# Patient Record
Sex: Female | Born: 1935 | Race: White | Hispanic: No | Smoking: Never smoker
Health system: Southern US, Community
[De-identification: ages and names within clinical notes are randomized; demographics above are authoritative.]

## PROBLEM LIST (undated history)

## (undated) DIAGNOSIS — R42 Dizziness and giddiness: Secondary | ICD-10-CM

## (undated) DIAGNOSIS — M1990 Unspecified osteoarthritis, unspecified site: Secondary | ICD-10-CM

## (undated) DIAGNOSIS — E079 Disorder of thyroid, unspecified: Secondary | ICD-10-CM

## (undated) DIAGNOSIS — F419 Anxiety disorder, unspecified: Secondary | ICD-10-CM

## (undated) DIAGNOSIS — I10 Essential (primary) hypertension: Secondary | ICD-10-CM

## (undated) DIAGNOSIS — K219 Gastro-esophageal reflux disease without esophagitis: Secondary | ICD-10-CM

## (undated) DIAGNOSIS — J302 Other seasonal allergic rhinitis: Secondary | ICD-10-CM

## (undated) DIAGNOSIS — E509 Vitamin A deficiency, unspecified: Secondary | ICD-10-CM

## (undated) DIAGNOSIS — F32A Depression, unspecified: Secondary | ICD-10-CM

## (undated) DIAGNOSIS — F329 Major depressive disorder, single episode, unspecified: Secondary | ICD-10-CM

## (undated) DIAGNOSIS — Z8781 Personal history of (healed) traumatic fracture: Secondary | ICD-10-CM

## (undated) DIAGNOSIS — E039 Hypothyroidism, unspecified: Secondary | ICD-10-CM

## (undated) DIAGNOSIS — S62101A Fracture of unspecified carpal bone, right wrist, initial encounter for closed fracture: Secondary | ICD-10-CM

## (undated) DIAGNOSIS — S42009A Fracture of unspecified part of unspecified clavicle, initial encounter for closed fracture: Secondary | ICD-10-CM

## (undated) DIAGNOSIS — M797 Fibromyalgia: Secondary | ICD-10-CM

## (undated) DIAGNOSIS — R296 Repeated falls: Secondary | ICD-10-CM

## (undated) HISTORY — PX: ABDOMINAL HYSTERECTOMY: SHX81

## (undated) HISTORY — PX: BACK SURGERY: SHX140

## (undated) HISTORY — PX: WRIST SURGERY: SHX841

## (undated) HISTORY — PX: CHOLECYSTECTOMY: SHX55

## (undated) HISTORY — PX: THYROIDECTOMY: SHX17

## (undated) HISTORY — PX: FRACTURE SURGERY: SHX138

---

## 1997-11-17 ENCOUNTER — Ambulatory Visit (HOSPITAL_COMMUNITY): Admission: RE | Admit: 1997-11-17 | Discharge: 1997-11-17 | Payer: Self-pay | Admitting: Orthopaedic Surgery

## 1997-11-17 ENCOUNTER — Encounter: Payer: Self-pay | Admitting: Orthopaedic Surgery

## 1997-12-04 ENCOUNTER — Ambulatory Visit (HOSPITAL_BASED_OUTPATIENT_CLINIC_OR_DEPARTMENT_OTHER): Admission: RE | Admit: 1997-12-04 | Discharge: 1997-12-04 | Payer: Self-pay | Admitting: Orthopedic Surgery

## 1998-04-29 ENCOUNTER — Other Ambulatory Visit: Admission: RE | Admit: 1998-04-29 | Discharge: 1998-04-29 | Payer: Self-pay | Admitting: Gynecology

## 1998-06-29 ENCOUNTER — Other Ambulatory Visit: Admission: RE | Admit: 1998-06-29 | Discharge: 1998-06-29 | Payer: Self-pay | Admitting: Gynecology

## 1998-08-18 ENCOUNTER — Ambulatory Visit (HOSPITAL_BASED_OUTPATIENT_CLINIC_OR_DEPARTMENT_OTHER): Admission: RE | Admit: 1998-08-18 | Discharge: 1998-08-18 | Payer: Self-pay | Admitting: Orthopedic Surgery

## 1998-11-16 ENCOUNTER — Encounter: Payer: Self-pay | Admitting: Orthopedic Surgery

## 1998-11-16 ENCOUNTER — Ambulatory Visit (HOSPITAL_COMMUNITY): Admission: RE | Admit: 1998-11-16 | Discharge: 1998-11-16 | Payer: Self-pay | Admitting: Orthopedic Surgery

## 1998-12-31 ENCOUNTER — Other Ambulatory Visit: Admission: RE | Admit: 1998-12-31 | Discharge: 1998-12-31 | Payer: Self-pay | Admitting: Gynecology

## 1999-03-14 ENCOUNTER — Encounter: Payer: Self-pay | Admitting: Orthopedic Surgery

## 1999-03-14 ENCOUNTER — Ambulatory Visit (HOSPITAL_COMMUNITY): Admission: RE | Admit: 1999-03-14 | Discharge: 1999-03-14 | Payer: Self-pay | Admitting: Orthopedic Surgery

## 1999-04-08 ENCOUNTER — Encounter: Admission: RE | Admit: 1999-04-08 | Discharge: 1999-05-13 | Payer: Self-pay | Admitting: Anesthesiology

## 1999-06-16 ENCOUNTER — Ambulatory Visit (HOSPITAL_COMMUNITY): Admission: RE | Admit: 1999-06-16 | Discharge: 1999-06-16 | Payer: Self-pay | Admitting: Neurosurgery

## 1999-06-16 ENCOUNTER — Encounter: Payer: Self-pay | Admitting: Neurosurgery

## 1999-06-24 ENCOUNTER — Encounter: Payer: Self-pay | Admitting: Neurosurgery

## 1999-06-27 ENCOUNTER — Other Ambulatory Visit: Admission: RE | Admit: 1999-06-27 | Discharge: 1999-06-27 | Payer: Self-pay | Admitting: Gynecology

## 1999-06-28 ENCOUNTER — Inpatient Hospital Stay (HOSPITAL_COMMUNITY): Admission: RE | Admit: 1999-06-28 | Discharge: 1999-06-30 | Payer: Self-pay | Admitting: Neurosurgery

## 1999-06-28 ENCOUNTER — Encounter: Payer: Self-pay | Admitting: Neurosurgery

## 1999-07-26 ENCOUNTER — Other Ambulatory Visit: Admission: RE | Admit: 1999-07-26 | Discharge: 1999-07-26 | Payer: Self-pay | Admitting: Gynecology

## 1999-07-26 ENCOUNTER — Encounter (INDEPENDENT_AMBULATORY_CARE_PROVIDER_SITE_OTHER): Payer: Self-pay | Admitting: Specialist

## 1999-09-26 ENCOUNTER — Other Ambulatory Visit: Admission: RE | Admit: 1999-09-26 | Discharge: 1999-09-26 | Payer: Self-pay | Admitting: Gynecology

## 1999-10-11 ENCOUNTER — Ambulatory Visit (HOSPITAL_COMMUNITY): Admission: RE | Admit: 1999-10-11 | Discharge: 1999-10-11 | Payer: Self-pay | Admitting: Gynecology

## 1999-10-11 ENCOUNTER — Encounter: Payer: Self-pay | Admitting: Gynecology

## 1999-10-27 ENCOUNTER — Encounter (INDEPENDENT_AMBULATORY_CARE_PROVIDER_SITE_OTHER): Payer: Self-pay

## 1999-10-27 ENCOUNTER — Other Ambulatory Visit: Admission: RE | Admit: 1999-10-27 | Discharge: 1999-10-27 | Payer: Self-pay | Admitting: Gynecology

## 1999-11-29 ENCOUNTER — Encounter: Admission: RE | Admit: 1999-11-29 | Discharge: 1999-12-27 | Payer: Self-pay | Admitting: Neurosurgery

## 2000-02-22 ENCOUNTER — Encounter (INDEPENDENT_AMBULATORY_CARE_PROVIDER_SITE_OTHER): Payer: Self-pay | Admitting: Specialist

## 2000-02-22 ENCOUNTER — Ambulatory Visit (HOSPITAL_COMMUNITY): Admission: RE | Admit: 2000-02-22 | Discharge: 2000-02-22 | Payer: Self-pay | Admitting: Family Medicine

## 2000-02-28 ENCOUNTER — Encounter: Payer: Self-pay | Admitting: *Deleted

## 2000-02-28 ENCOUNTER — Ambulatory Visit (HOSPITAL_COMMUNITY): Admission: RE | Admit: 2000-02-28 | Discharge: 2000-02-28 | Payer: Self-pay | Admitting: *Deleted

## 2000-05-29 ENCOUNTER — Other Ambulatory Visit: Admission: RE | Admit: 2000-05-29 | Discharge: 2000-05-29 | Payer: Self-pay | Admitting: Gynecology

## 2000-09-27 ENCOUNTER — Other Ambulatory Visit: Admission: RE | Admit: 2000-09-27 | Discharge: 2000-09-27 | Payer: Self-pay | Admitting: Gynecology

## 2001-02-21 ENCOUNTER — Encounter: Payer: Self-pay | Admitting: Internal Medicine

## 2001-02-21 ENCOUNTER — Ambulatory Visit (HOSPITAL_COMMUNITY): Admission: RE | Admit: 2001-02-21 | Discharge: 2001-02-21 | Payer: Self-pay | Admitting: Internal Medicine

## 2001-05-13 ENCOUNTER — Other Ambulatory Visit: Admission: RE | Admit: 2001-05-13 | Discharge: 2001-05-13 | Payer: Self-pay | Admitting: Gynecology

## 2001-11-12 ENCOUNTER — Ambulatory Visit (HOSPITAL_COMMUNITY): Admission: RE | Admit: 2001-11-12 | Discharge: 2001-11-12 | Payer: Self-pay | Admitting: *Deleted

## 2001-11-12 ENCOUNTER — Encounter (INDEPENDENT_AMBULATORY_CARE_PROVIDER_SITE_OTHER): Payer: Self-pay | Admitting: Specialist

## 2001-12-30 ENCOUNTER — Ambulatory Visit (HOSPITAL_COMMUNITY): Admission: RE | Admit: 2001-12-30 | Discharge: 2001-12-30 | Payer: Self-pay | Admitting: *Deleted

## 2002-01-28 ENCOUNTER — Other Ambulatory Visit: Admission: RE | Admit: 2002-01-28 | Discharge: 2002-01-28 | Payer: Self-pay | Admitting: Gynecology

## 2002-04-23 ENCOUNTER — Encounter: Admission: RE | Admit: 2002-04-23 | Discharge: 2002-04-23 | Payer: Self-pay | Admitting: *Deleted

## 2002-04-23 ENCOUNTER — Encounter: Payer: Self-pay | Admitting: *Deleted

## 2002-08-27 ENCOUNTER — Encounter: Admission: RE | Admit: 2002-08-27 | Discharge: 2002-08-27 | Payer: Self-pay | Admitting: Internal Medicine

## 2002-08-27 ENCOUNTER — Encounter: Payer: Self-pay | Admitting: Internal Medicine

## 2002-09-01 ENCOUNTER — Other Ambulatory Visit: Admission: RE | Admit: 2002-09-01 | Discharge: 2002-09-01 | Payer: Self-pay | Admitting: Gynecology

## 2002-09-26 ENCOUNTER — Encounter: Payer: Self-pay | Admitting: Gynecology

## 2002-09-26 ENCOUNTER — Ambulatory Visit (HOSPITAL_COMMUNITY): Admission: RE | Admit: 2002-09-26 | Discharge: 2002-09-26 | Payer: Self-pay | Admitting: Gynecology

## 2002-11-10 ENCOUNTER — Ambulatory Visit (HOSPITAL_COMMUNITY): Admission: RE | Admit: 2002-11-10 | Discharge: 2002-11-10 | Payer: Self-pay | Admitting: Rheumatology

## 2002-11-10 ENCOUNTER — Encounter: Payer: Self-pay | Admitting: Rheumatology

## 2002-11-19 ENCOUNTER — Encounter: Admission: RE | Admit: 2002-11-19 | Discharge: 2002-11-19 | Payer: Self-pay | Admitting: Internal Medicine

## 2002-11-19 ENCOUNTER — Encounter: Payer: Self-pay | Admitting: Internal Medicine

## 2002-12-02 ENCOUNTER — Encounter: Payer: Self-pay | Admitting: Internal Medicine

## 2002-12-02 ENCOUNTER — Encounter (INDEPENDENT_AMBULATORY_CARE_PROVIDER_SITE_OTHER): Payer: Self-pay | Admitting: *Deleted

## 2002-12-02 ENCOUNTER — Ambulatory Visit (HOSPITAL_COMMUNITY): Admission: RE | Admit: 2002-12-02 | Discharge: 2002-12-02 | Payer: Self-pay | Admitting: Internal Medicine

## 2003-02-09 ENCOUNTER — Ambulatory Visit (HOSPITAL_COMMUNITY): Admission: RE | Admit: 2003-02-09 | Discharge: 2003-02-09 | Payer: Self-pay | Admitting: Endocrinology

## 2003-03-12 ENCOUNTER — Other Ambulatory Visit: Admission: RE | Admit: 2003-03-12 | Discharge: 2003-03-12 | Payer: Self-pay | Admitting: Gynecology

## 2003-07-22 ENCOUNTER — Ambulatory Visit (HOSPITAL_COMMUNITY): Admission: RE | Admit: 2003-07-22 | Discharge: 2003-07-22 | Payer: Self-pay | Admitting: Endocrinology

## 2003-11-03 ENCOUNTER — Other Ambulatory Visit: Admission: RE | Admit: 2003-11-03 | Discharge: 2003-11-03 | Payer: Self-pay | Admitting: *Deleted

## 2003-11-20 ENCOUNTER — Ambulatory Visit (HOSPITAL_COMMUNITY): Admission: RE | Admit: 2003-11-20 | Discharge: 2003-11-20 | Payer: Self-pay | Admitting: Gynecology

## 2003-11-27 ENCOUNTER — Encounter: Admission: RE | Admit: 2003-11-27 | Discharge: 2003-11-27 | Payer: Self-pay | Admitting: Rheumatology

## 2004-02-25 ENCOUNTER — Ambulatory Visit (HOSPITAL_COMMUNITY): Admission: RE | Admit: 2004-02-25 | Discharge: 2004-02-25 | Payer: Self-pay | Admitting: General Surgery

## 2004-06-21 ENCOUNTER — Other Ambulatory Visit: Admission: RE | Admit: 2004-06-21 | Discharge: 2004-06-21 | Payer: Self-pay | Admitting: Gynecology

## 2004-07-26 ENCOUNTER — Ambulatory Visit (HOSPITAL_COMMUNITY): Admission: RE | Admit: 2004-07-26 | Discharge: 2004-07-26 | Payer: Self-pay | Admitting: Gynecology

## 2004-10-10 ENCOUNTER — Ambulatory Visit (HOSPITAL_COMMUNITY): Admission: RE | Admit: 2004-10-10 | Discharge: 2004-10-10 | Payer: Self-pay | Admitting: General Surgery

## 2004-11-23 ENCOUNTER — Ambulatory Visit (HOSPITAL_COMMUNITY): Admission: RE | Admit: 2004-11-23 | Discharge: 2004-11-23 | Payer: Self-pay | Admitting: Family Medicine

## 2004-12-21 ENCOUNTER — Other Ambulatory Visit: Admission: RE | Admit: 2004-12-21 | Discharge: 2004-12-21 | Payer: Self-pay | Admitting: Gynecology

## 2005-09-21 ENCOUNTER — Other Ambulatory Visit: Admission: RE | Admit: 2005-09-21 | Discharge: 2005-09-21 | Payer: Self-pay | Admitting: Gynecology

## 2005-09-26 ENCOUNTER — Encounter: Admission: RE | Admit: 2005-09-26 | Discharge: 2005-09-26 | Payer: Self-pay | Admitting: Cardiology

## 2005-10-10 ENCOUNTER — Encounter (HOSPITAL_COMMUNITY): Admission: RE | Admit: 2005-10-10 | Discharge: 2005-11-08 | Payer: Self-pay | Admitting: Cardiology

## 2006-04-12 ENCOUNTER — Ambulatory Visit (HOSPITAL_COMMUNITY): Admission: RE | Admit: 2006-04-12 | Discharge: 2006-04-12 | Payer: Self-pay | Admitting: Gynecology

## 2006-04-12 ENCOUNTER — Other Ambulatory Visit: Admission: RE | Admit: 2006-04-12 | Discharge: 2006-04-12 | Payer: Self-pay | Admitting: Gynecology

## 2007-04-24 ENCOUNTER — Other Ambulatory Visit: Admission: RE | Admit: 2007-04-24 | Discharge: 2007-04-24 | Payer: Self-pay | Admitting: Gynecology

## 2008-01-29 ENCOUNTER — Other Ambulatory Visit: Admission: RE | Admit: 2008-01-29 | Discharge: 2008-01-29 | Payer: Self-pay | Admitting: Gynecology

## 2009-01-01 ENCOUNTER — Ambulatory Visit (HOSPITAL_COMMUNITY): Admission: RE | Admit: 2009-01-01 | Discharge: 2009-01-01 | Payer: Self-pay | Admitting: Gastroenterology

## 2009-06-17 ENCOUNTER — Encounter: Admission: RE | Admit: 2009-06-17 | Discharge: 2009-06-22 | Payer: Self-pay | Admitting: Family Medicine

## 2009-07-06 ENCOUNTER — Encounter: Admission: RE | Admit: 2009-07-06 | Discharge: 2009-08-16 | Payer: Self-pay | Admitting: Family Medicine

## 2009-07-27 ENCOUNTER — Ambulatory Visit (HOSPITAL_COMMUNITY): Admission: RE | Admit: 2009-07-27 | Discharge: 2009-07-27 | Payer: Self-pay | Admitting: Gynecology

## 2009-09-27 ENCOUNTER — Encounter: Admission: RE | Admit: 2009-09-27 | Discharge: 2009-09-27 | Payer: Self-pay | Admitting: Family Medicine

## 2009-11-15 ENCOUNTER — Encounter: Admission: RE | Admit: 2009-11-15 | Discharge: 2009-11-15 | Payer: Self-pay | Admitting: Family Medicine

## 2009-11-24 ENCOUNTER — Ambulatory Visit: Payer: Self-pay | Admitting: Vascular Surgery

## 2009-11-24 ENCOUNTER — Ambulatory Visit (HOSPITAL_COMMUNITY): Admission: RE | Admit: 2009-11-24 | Discharge: 2009-11-24 | Payer: Self-pay | Admitting: Family Medicine

## 2009-11-24 ENCOUNTER — Encounter: Payer: Self-pay | Admitting: Family Medicine

## 2009-12-03 ENCOUNTER — Encounter: Payer: Self-pay | Admitting: Family Medicine

## 2009-12-03 ENCOUNTER — Ambulatory Visit: Admission: RE | Admit: 2009-12-03 | Discharge: 2009-12-03 | Payer: Self-pay | Admitting: Family Medicine

## 2010-01-18 ENCOUNTER — Encounter: Admission: RE | Admit: 2010-01-18 | Discharge: 2010-01-18 | Payer: Self-pay | Admitting: Family Medicine

## 2010-03-10 ENCOUNTER — Ambulatory Visit: Admit: 2010-03-10 | Payer: Self-pay | Admitting: Internal Medicine

## 2010-03-24 ENCOUNTER — Ambulatory Visit
Admission: RE | Admit: 2010-03-24 | Discharge: 2010-03-24 | Disposition: A | Discharge status: Home / Self Care | Payer: Medicare Other | Source: Ambulatory Visit | Attending: Family Medicine | Admitting: Family Medicine

## 2010-03-24 ENCOUNTER — Other Ambulatory Visit: Payer: Self-pay | Admitting: Family Medicine

## 2010-03-24 DIAGNOSIS — R52 Pain, unspecified: Secondary | ICD-10-CM

## 2010-04-13 ENCOUNTER — Other Ambulatory Visit (HOSPITAL_COMMUNITY): Payer: Self-pay | Admitting: Orthopaedic Surgery

## 2010-04-13 DIAGNOSIS — IMO0002 Reserved for concepts with insufficient information to code with codable children: Secondary | ICD-10-CM

## 2010-04-18 ENCOUNTER — Ambulatory Visit (HOSPITAL_COMMUNITY): Admission: RE | Admit: 2010-04-18 | Payer: Medicare Other | Source: Ambulatory Visit

## 2010-04-19 ENCOUNTER — Ambulatory Visit (HOSPITAL_COMMUNITY)
Admission: RE | Admit: 2010-04-19 | Discharge: 2010-04-19 | Disposition: A | Discharge status: Home / Self Care | Payer: Medicare Other | Source: Ambulatory Visit | Attending: Orthopaedic Surgery | Admitting: Orthopaedic Surgery

## 2010-04-19 ENCOUNTER — Other Ambulatory Visit (HOSPITAL_COMMUNITY): Payer: Self-pay | Admitting: Interventional Radiology

## 2010-04-19 DIAGNOSIS — IMO0002 Reserved for concepts with insufficient information to code with codable children: Secondary | ICD-10-CM

## 2010-04-22 ENCOUNTER — Ambulatory Visit (HOSPITAL_COMMUNITY)
Admission: RE | Admit: 2010-04-22 | Discharge: 2010-04-22 | Disposition: A | Discharge status: Home / Self Care | Payer: Medicare Other | Source: Ambulatory Visit | Attending: Interventional Radiology | Admitting: Interventional Radiology

## 2010-04-22 DIAGNOSIS — D45 Polycythemia vera: Secondary | ICD-10-CM | POA: Insufficient documentation

## 2010-04-22 DIAGNOSIS — Z01812 Encounter for preprocedural laboratory examination: Secondary | ICD-10-CM | POA: Insufficient documentation

## 2010-04-22 DIAGNOSIS — K219 Gastro-esophageal reflux disease without esophagitis: Secondary | ICD-10-CM | POA: Insufficient documentation

## 2010-04-22 DIAGNOSIS — M8448XA Pathological fracture, other site, initial encounter for fracture: Secondary | ICD-10-CM | POA: Insufficient documentation

## 2010-04-22 DIAGNOSIS — IMO0002 Reserved for concepts with insufficient information to code with codable children: Secondary | ICD-10-CM | POA: Insufficient documentation

## 2010-04-22 DIAGNOSIS — M48061 Spinal stenosis, lumbar region without neurogenic claudication: Secondary | ICD-10-CM | POA: Insufficient documentation

## 2010-04-22 DIAGNOSIS — E119 Type 2 diabetes mellitus without complications: Secondary | ICD-10-CM | POA: Insufficient documentation

## 2010-04-22 DIAGNOSIS — IMO0001 Reserved for inherently not codable concepts without codable children: Secondary | ICD-10-CM | POA: Insufficient documentation

## 2010-04-22 LAB — APTT: aPTT: 27 seconds (ref 24–37)

## 2010-04-22 LAB — CBC
HCT: 48.4 % — ABNORMAL HIGH (ref 36.0–46.0)
Hemoglobin: 17.3 g/dL — ABNORMAL HIGH (ref 12.0–15.0)
MCH: 30.1 pg (ref 26.0–34.0)
MCHC: 35.7 g/dL (ref 30.0–36.0)
MCV: 84.2 fL (ref 78.0–100.0)
Platelets: 234 10*3/uL (ref 150–400)
RBC: 5.75 MIL/uL — ABNORMAL HIGH (ref 3.87–5.11)
RDW: 13.8 % (ref 11.5–15.5)
WBC: 8.3 10*3/uL (ref 4.0–10.5)

## 2010-04-22 LAB — PROTIME-INR
INR: 0.96 (ref 0.00–1.49)
Prothrombin Time: 13 seconds (ref 11.6–15.2)

## 2010-04-22 LAB — POCT I-STAT, CHEM 8
Calcium, Ion: 1.05 mmol/L — ABNORMAL LOW (ref 1.12–1.32)
Creatinine, Ser: 0.9 mg/dL (ref 0.4–1.2)
Hemoglobin: 17.7 g/dL — ABNORMAL HIGH (ref 12.0–15.0)
Sodium: 138 mEq/L (ref 135–145)
TCO2: 31 mmol/L (ref 0–100)

## 2010-04-22 MED ORDER — IOHEXOL 300 MG/ML  SOLN
3.0000 mL | Freq: Once | INTRAMUSCULAR | Status: AC | PRN
  Administered 2010-04-22: 3 mL via INTRAVENOUS

## 2010-04-25 ENCOUNTER — Other Ambulatory Visit (HOSPITAL_COMMUNITY): Payer: Self-pay | Admitting: Interventional Radiology

## 2010-04-25 DIAGNOSIS — IMO0002 Reserved for concepts with insufficient information to code with codable children: Secondary | ICD-10-CM

## 2010-05-03 ENCOUNTER — Ambulatory Visit (HOSPITAL_COMMUNITY): Payer: Medicare Other

## 2010-05-03 ENCOUNTER — Ambulatory Visit (HOSPITAL_COMMUNITY)
Admission: RE | Admit: 2010-05-03 | Discharge: 2010-05-03 | Disposition: A | Discharge status: Home / Self Care | Payer: Medicare Other | Source: Ambulatory Visit | Attending: Interventional Radiology | Admitting: Interventional Radiology

## 2010-05-03 DIAGNOSIS — IMO0002 Reserved for concepts with insufficient information to code with codable children: Secondary | ICD-10-CM

## 2010-05-06 ENCOUNTER — Ambulatory Visit (HOSPITAL_COMMUNITY): Payer: Medicare Other

## 2010-07-06 ENCOUNTER — Other Ambulatory Visit: Payer: Self-pay | Admitting: Internal Medicine

## 2010-07-06 DIAGNOSIS — M79605 Pain in left leg: Secondary | ICD-10-CM

## 2010-07-06 DIAGNOSIS — R609 Edema, unspecified: Secondary | ICD-10-CM

## 2010-07-08 ENCOUNTER — Other Ambulatory Visit: Payer: Medicare Other

## 2010-07-08 NOTE — Op Note (Signed)
NAMEMALEEHA, HALLS                          ACCOUNT NO.:  [id number]   MEDICAL RECORD NO.:  [id number]                   PATIENT TYPE:  AMB   LOCATION:  ENDO                                 FACILITY:  [facility]   PHYSICIAN:  Althea Grimmer. Luther Parody, M.D.            DATE OF BIRTH:  [birth year]   DATE OF PROCEDURE:  DATE OF DISCHARGE:  12/30/2001                                 OPERATIVE REPORT   PROCEDURE:  Esophageal manometry.   INDICATIONS FOR PROCEDURE:  Chest discomfort, reflux, nausea, regurgitation  and dysphagia.  The lower esophageal sphincter occurred from 39.5 cm to 43  cm and the lower esophageal sphincter pressure was 29.3 mmHg which is  normal.  There was 72% relaxation with swallows which is normal with a  residual pressure of 8 mmHg.  The peristaltic waves of the proximal, mid and  distal esophagus were within normal limits with  mean distal esophageal  amplitude of the heart and 57 mmHg which is normal.  The upper esophageal  sphincter pressure was minimally hypertensive at 131.5 mmHg.  Review of the  manometry tracings indicates appropriate relaxation of the lower esophageal  sphincter with normal peristaltic wave approach.   IMPRESSION:  Essentially normal esophageal manometry.   PROCEDURE:  A 24-hour pH probe testing.   INDICATIONS FOR PROCEDURE:  As above.   DESCRIPTION OF PROCEDURE:  The procedure was performed on single dose proton  pump inhibitor.  The proximal probe revealed 28 total reflux episodes, 15  upright and 13 recumbent, none lasted more than five minutes.  Longest  duration was one minute.  The distal probe revealed 50 reflux episodes, 33  of which were recumbent, the longest of which lasted two minutes.  The  composite analysis revealed 1.1% of the time the patient was having reflux  with no episodes over five minutes and the longest episode of one and a half  minutes.  Her total episodes were 57 with normal being less than 50.  Her  composite  Demister score was 7.8 with normal being less than 22.  Although  two of the episodes clearly were associated with the sensation of heartburn,  there was very poor correlation overall of reflux with either sensations of  heartburn or chest pain.   IMPRESSION:  The patient has multiple mild brief episodes of reflux which  should not be giving her the degree of symptoms which she reports.                                               Althea Grimmer. Luther Parody, M.D.    PJS/MEDQ  D:  01/07/2002  T:  01/07/2002  Job:  [id number]   cc:   Darius Bump, M.D.  [place]  [place] [address]  Fax: [phone]

## 2010-07-08 NOTE — Op Note (Signed)
[place]. [facility]  Patient:    Erika Conley, Erika Conley                       MRN: [id number] Proc. Date: 06/28/99 Adm. Date:  [id number] Attending:  Josie Saunders                           Operative Report  PREOPERATIVE DIAGNOSES: 1. Lumbar spinal stenosis (lateral recess stenosis) L4 and L5 bilaterally. 2. Lumbar radiculopathy 3. Degenerative disk disease. 4. Herniated lumbar disk  POSTOPERATIVE DIAGNOSIS: 1. Lumbar spinal stenosis (lateral recessed stenosis) L4 and L5 bilaterally. 2. Lumbar radiculopathy 3. Degenerative disk disease. 4. Herniated lumbar disk  OPERATION:  Decompressive lumbar laminectomy L4 and L5.  SURGEON:  Danae Orleans. Venetia Maxon, M.D.  ASSISTANT:  Alanson Aly. Roxan Hockey, M.D.  ANESTHESIA:  General endotracheal anesthesia.  ESTIMATED BLOOD LOSS:  Less than 100 cc.  COMPLICATIONS:  None.  DISPOSITION:  Recovery.  INDICATIONS:  [name] 76 year old woman with severe left lower extremity pain and L5 radiculopathy.  She was found to have bilateral lateral recess stenosis L4-5.  It was elected to perform decompressive lumbar laminectomy L4 and L5.  DESCRIPTION OF PROCEDURE:  Ms. Vos was brought to the operating room. Following satisfactory and uncomplicated induction of general endotracheal anesthesia and placement of intravenous, she was placed in prone position on the Wilson frame.  Her low back was prepped and draped in the usual sterile fashion.  The area of planned incision was infiltrated with 0.25% Marcaine, 0.50% lidocaine, 1:200,000 epinephrine.  Incision was made overlying the L4 and L5 spinous processes, carried to the lumbar dorsal fascia.  It was then incised with electrocautery.  Subperiosteal dissection was performed exposing the L4 and L5 laminae bilaterally.  A self-retaining retractor was place. Intraoperative x-ray confirmed appropriate orientation with the L4 and L5 spinous processes.  Total laminectomy was  performed of L5 and the spinous process of L4 was removed.  Midas Rex drill with AM3 bur was used to thin the laminae of L4 and L5 bilaterally.  This was then completed with Kerrison rongeur.  The area was significantly hypertrophied ligamentum flavum.  This was decompressed.  Lateral recesses were decompressed, and the L5 nerve roots were decompressed bilaterally along with the S1 nerve roots.  The nerve roots were felt to be well decompressed, and hypertrophied ligamentum and osteophyte were removed.   The L4 and L5 and S1 nerve roots were all felt to be well decompressed.  Hemostasis was obtained with Gelfoam soak and thrombin.  The wound was copiously irrigated with bacitracin and saline.  The self-retaining retractor was removed.  The deep muscle layer was reapproximated with 0 Vicryl sutures, and the lumbar dorsal fascia was reapproximated with 0 Vicryl sutures.  Subcutaneous fat was reapproximated with 2-0 Vicryl interrupted inverted sutures. Skin edges were reapproximated with interrupted 3-0 Vicryl subcuticular stitch.  The wound was dressed with Dermabond, and a Telfa dressing was applied.  Counts were correct at the end of the case. DD:  06/28/99 TD:  06/29/99 Job: [id number]

## 2010-07-08 NOTE — Discharge Summary (Signed)
[place]. [facility]  Patient:    Erika Conley, Erika Conley                       MRN: [id number] Adm. Date:  [id number] Disc. Date: [id number] Attending:  Josie Saunders                           Discharge Summary  REASON FOR ADMISSION:  Lumbar spinal stenosis with lumbar degenerative disk disease and lumbar disk herniation with lumbar radiculopathy.  FINAL DIAGNOSIS:  Lumbar spinal stenosis with lumbar degenerative disk disease and lumbar disk herniation with lumbar radiculopathy.  HISTORY OF PRESENT ILLNESS AND HOSPITAL COURSE:  Erika Conley is a 76 year old woman with severe bilateral lumbar radiculopathy with lateral recess stenosis at L4-5.  The patient was admitted to the hospital on same-day-as-procedure basis, underwent uncomplicated decompressive lumbar laminectomy L4 and L5 levels.  Postoperatively, she had improvement in her radicular complaints. She was mobilized and gradually doing well as of Jun 30, 1999, and was discharged home at that point in stable and satisfactory condition, having tolerated the operation and hospitalization well.  DISCHARGE MEDICATIONS: 1. Percocet 1-2 every four hours as needed for pain. 2. Regular medications that she was taken preoperatively.  DISCHARGE INSTRUCTIONS:  No lifting, bending, twisting, or driving.  Okay to shower but not to soak her incision.  FOLLOW-UP:  In two weeks with Dr. Venetia Maxon. DD:  07/08/99 TD:  07/12/99 Job: [id number]

## 2010-07-08 NOTE — H&P (Signed)
[place]. [facility]  Patient:    Erika Conley, Erika Conley                       MRN: [id number] Adm. Date:  [id number] Attending:  Josie Saunders                         History and Physical  CHIEF COMPLAINT:  Lumbar stenosis, spondylosis, and lumbar radiculopathy.  HISTORY OF PRESENT ILLNESS:  Ms. Erika Conley is a patient whom I initially saw in 1998.  She returned to see me on June 13, 1999.  I had not seen her since 1998. At that point I was treating her for shoulder and neck pain, which subsequently was resolved, and she underwent rotator cuff repair surgery.  Ms. Erika Conley now presents  with a new complaint of left hip left pain and left hip pain.  She says she fell off of a cabinet 2-1/2 years ago when she was hanging curtains.  She developed ow back pain approximately 1-1/2 years ago.  She was initially felt by Dr. Darius Bump, to have bursitis, and she was treated with nonsteroidal anti-inflammatory drugs, but this did not improve her pain.  She then saw Dr. Loreta Ave, who obtained an MRI of her hip and did a cortisone shot.  She said she had a headache for five days and a rash after the cortisone shot, but did not get any improvement. Initially  Dr. Eulah Pont had been talking about possible hip replacement surgery.  She subsequently saw Dr. Artist Pais. Weingold, who felt that her problem may have been from her low back and not her hip, and he referred her to Dr. Harvie Junior, who diagnosed her with degenerative disk disease and bone spurs.  She is currently complaining of pain radiating into her left groin and into her foot, as she says it goes down to the side of her foot and also into the front of her foot.  She says it is more intense in her groin and also in the side of her leg, and particularly ith standing.  She notes that she has more pain into her buttock with sitting.  She  notes weakness in her leg, and says she drags  her leg.  She denies any right lower extremity or any bowel or bladder dysfunction.  Ms. Erika Conley has engaged in physical therapy, water therapy through the [facility] and she says these have been helpful, and that the pain eases, but never fully goes  away.  REVIEW OF SYSTEMS:  A detailed review of systems sheet was reviewed with the patient.  Pertinent positives include under eyes that she wears glasses.  Ears,  nose, mouth, and throat:  She has nasal congestion and drainage, sinus problems, sinus headaches, and sore throat.  Cardiovascular:  She has swelling in her feet and hands.  Leg pain with walking.  Musculoskeletal:  She has a previously-broken wrist, back pain, leg pain, joint pain, swelling, arthritis.  Psychiatric:  She has anxiety and depression:  Endocrine:  She has thyroid disease.  PAST MEDICAL HISTORY/PRIOR OPERATIONS/HOSPITALIZATIONS:  1. Significant for shoulder surgery.  2. Wrist fracture.  3. She was treated for acid reflux.  4. Treated for arthritis.  5. Prior appendectomy.  6. Cholecystectomy.  7. Thyroidectomy.  8. Partial hysterectomy.  9. Tubal ligation. 10. Four surgeries on her right wrist.  ALLERGIES:  CORTISONE, ACTONEL, CELEBREX, FOSAMAX, LODINE XL,  MICROBIC ________, RELAFEN, VIOXX, CODEINE.  She says she can take synthetic codeine.  CURRENT MEDICATIONS: 1. Aciphex 20 mg one q.d. 2. Aldactone 50 mg b.i.d. 3. Celexa 20 mg q.d. 4. Claritin D p.r.n. 5. Desyrel 150 mg q. night. 6. Lasix 40 mg q.d. 7. Premarin 0.625 mg q.d. 8. Synthroid 0.125 mg q.d. 9. Vicodin one to two q.4-6h. p.r.n. pain.  Currently her doctors include Dr. Darius Bump, who is her primary doctor,  Dr. Dimas Aguas C. Mezer, for GYN, Dr. Artist Pais. Weingold, an orthopedic doctor, and Dr. Harvie Junior is also an orthopedist for her, and Dr. Corrie Dandy is her psychiatrist.   HEIGHT & WEIGHT:  Height 5 feet 3 inches tall, weight 160 Erika Conley.  SOCIAL HISTORY:   She is a nonsmoker.  A social drinker of alcoholic beverages. No history of substance abuse.  DIAGNOSTIC STUDIES:  Ms. Erika Conley presents with an MRI of the lumbosacral spine which was obtained through [facility] on March 14, 1999, which showed spinal stenosis at L4-5 with marked degenerative disk disease at the L5-S1 level, with disk space narrowing with lateral recess stenosis at L4-5, without frank disk herniation.  PHYSICAL EXAMINATION:  GENERAL:  Ms. Erika Conley is awake, alert, pleasant, cooperative, uncomfortable-appearing older white female.  NEUROLOGIC/MUSCULOSKELETAL:  She has purplish discoloration in both of her feet, right worse than left, but with brisk capillary refill on weakly palpable pedal  pulses.  When she stands, her right hip is elevated as compared to the left. She has some pain to palpation of the lumbosacral junction and also left sciatic notch, but also some significant tenderness over her left greater trochanter.  When she walks, she walks with an antalgic gait, favoring her left lower extremity.  She is able to stand on her toes, but has decreased heel walking, secondary to pain. he is able to bend forward to touch her toes.  She has full lower extremity strength in all motor groups bilaterally, symmetric, with the exception of 4/5 left extensor hallucis longus strength, and also 4/5 left gluteus strength.  She has slight decrease to pin sensation in an S1 distribution on the left.  Reflexes are trace at the knees, absent at the ankles.  The great toes are downgoing to plantar stimulation.  Straight leg raising is positive on the left, for the left lower extremity pain to 40 degrees to Spurlings maneuver, but also she has positive Patricks test on the left.  IMPRESSION/RECOMMENDATIONS:  Ms. Erika Conley is a 76 year old woman who is currently working as a Scientist, physiological, who presented at the request of Dr. Harvie Junior for neurosurgical consultation  with left lower extremity pain.  She has evidence of an L5 radiculopathy on the left and she has gluteus weakness and also some extensor hallucis longus weakness and a positive straight leg raising. She is  also complaining of pain to palpation over her trochanter, and has been having ain radiating into her groin, and a positive Patricks test, which seem more typical of her pathology.  I have recommended to Ms. Slingerland that she undergo a lumbar myelography.  She subsequently did so.  She returned to see me on Jun 21, 1999.  She says her low ack and left leg were bothering her a lot more than previously.  While the radiologist did not comment about the significant stenosis on the lumbar myelogram and post-lumbar myelographic CT, I reviewed these films, and I do believe that they  show significant lateral recess stenosis, particularly  on the left, compressing the L5 nerve root, secondary to central disk herniation at L4-5, with multifactorial spinal stenosis.  She also has significant degenerative disk disease at the L5-S1 level.  I have recommended, based on the severity of her pain, as well as her imaging findings, that she undergo a decompressive lumbar laminectomy at L4 and L5.  I ent over the risks and benefits of surgery, as well as the typical hospital preoperative and postoperative course.  I reviewed the studies with the patient and went over her physical examination.  I reviewed surgical models and discussed the risks and benefits of surgery.  The risks of surgery were discussed in detail, nd include but are not limited to, the risks of anesthesia, blood loss, the possibility of hemorrhage, infection, damage to nerves, damage to blood vessels, injuries to the lumbar nerve roots, causing the temporary or permanent leg pain, numbness, or weakness.  There is potential for spinal fluid leak from a dural tear. There is potential for post-laminectomy spondylolisthesis,  recurrent disk herniation.  I quoted her a 10% failure to relieve pain, worsening of the pain, and a need for further surgery.  Ms. Spragg wished the surgery to be done as soon as possible.  This is set up for Jun 28, 1999. DD:  06/28/99 TD:  06/28/99 Job: [id number]

## 2010-07-11 ENCOUNTER — Ambulatory Visit
Admission: RE | Admit: 2010-07-11 | Discharge: 2010-07-11 | Disposition: A | Discharge status: Home / Self Care | Payer: Medicare Other | Source: Ambulatory Visit | Attending: Internal Medicine | Admitting: Internal Medicine

## 2010-07-11 ENCOUNTER — Other Ambulatory Visit: Payer: Self-pay | Admitting: Internal Medicine

## 2010-07-11 ENCOUNTER — Other Ambulatory Visit: Payer: Medicare Other

## 2010-07-11 DIAGNOSIS — I872 Venous insufficiency (chronic) (peripheral): Secondary | ICD-10-CM

## 2010-07-11 DIAGNOSIS — R600 Localized edema: Secondary | ICD-10-CM

## 2010-07-11 MED ORDER — IOHEXOL 300 MG/ML  SOLN
125.0000 mL | Freq: Once | INTRAMUSCULAR | Status: AC | PRN
  Administered 2010-07-11: 125 mL via INTRAVENOUS

## 2010-07-20 ENCOUNTER — Ambulatory Visit
Admission: RE | Admit: 2010-07-20 | Discharge: 2010-07-20 | Disposition: A | Discharge status: Home / Self Care | Payer: Medicare Other | Source: Ambulatory Visit | Attending: Internal Medicine | Admitting: Internal Medicine

## 2010-07-20 DIAGNOSIS — M79605 Pain in left leg: Secondary | ICD-10-CM

## 2010-07-20 DIAGNOSIS — I872 Venous insufficiency (chronic) (peripheral): Secondary | ICD-10-CM

## 2010-07-20 DIAGNOSIS — R609 Edema, unspecified: Secondary | ICD-10-CM

## 2010-07-20 DIAGNOSIS — R600 Localized edema: Secondary | ICD-10-CM

## 2010-11-02 ENCOUNTER — Institutional Professional Consult (permissible substitution): Payer: Medicare Other | Admitting: Internal Medicine

## 2010-11-03 ENCOUNTER — Other Ambulatory Visit: Payer: Self-pay | Admitting: Internal Medicine

## 2010-11-03 DIAGNOSIS — M545 Low back pain, unspecified: Secondary | ICD-10-CM

## 2010-11-11 ENCOUNTER — Ambulatory Visit
Admission: RE | Admit: 2010-11-11 | Discharge: 2010-11-11 | Disposition: A | Discharge status: Home / Self Care | Payer: Medicare Other | Source: Ambulatory Visit | Attending: Internal Medicine | Admitting: Internal Medicine

## 2010-11-11 ENCOUNTER — Institutional Professional Consult (permissible substitution): Payer: Medicare Other | Admitting: Internal Medicine

## 2010-11-11 DIAGNOSIS — M545 Low back pain: Secondary | ICD-10-CM

## 2010-11-21 ENCOUNTER — Other Ambulatory Visit (HOSPITAL_COMMUNITY): Payer: Self-pay | Admitting: Internal Medicine

## 2010-11-21 DIAGNOSIS — IMO0002 Reserved for concepts with insufficient information to code with codable children: Secondary | ICD-10-CM

## 2010-11-22 ENCOUNTER — Other Ambulatory Visit (HOSPITAL_COMMUNITY): Payer: Self-pay | Admitting: Interventional Radiology

## 2010-11-22 ENCOUNTER — Ambulatory Visit (HOSPITAL_COMMUNITY)
Admission: RE | Admit: 2010-11-22 | Discharge: 2010-11-22 | Disposition: A | Discharge status: Home / Self Care | Payer: Medicare Other | Source: Ambulatory Visit | Attending: Internal Medicine | Admitting: Internal Medicine

## 2010-11-22 DIAGNOSIS — IMO0002 Reserved for concepts with insufficient information to code with codable children: Secondary | ICD-10-CM

## 2010-11-29 ENCOUNTER — Other Ambulatory Visit: Payer: Self-pay | Admitting: Interventional Radiology

## 2010-11-29 ENCOUNTER — Ambulatory Visit (HOSPITAL_COMMUNITY)
Admission: RE | Admit: 2010-11-29 | Discharge: 2010-11-29 | Disposition: A | Discharge status: Home / Self Care | Payer: Medicare Other | Source: Ambulatory Visit | Attending: Interventional Radiology | Admitting: Interventional Radiology

## 2010-11-29 ENCOUNTER — Other Ambulatory Visit (HOSPITAL_COMMUNITY): Payer: Self-pay | Admitting: Interventional Radiology

## 2010-11-29 DIAGNOSIS — Z01812 Encounter for preprocedural laboratory examination: Secondary | ICD-10-CM | POA: Insufficient documentation

## 2010-11-29 DIAGNOSIS — M8448XA Pathological fracture, other site, initial encounter for fracture: Secondary | ICD-10-CM | POA: Insufficient documentation

## 2010-11-29 DIAGNOSIS — K219 Gastro-esophageal reflux disease without esophagitis: Secondary | ICD-10-CM | POA: Insufficient documentation

## 2010-11-29 DIAGNOSIS — E039 Hypothyroidism, unspecified: Secondary | ICD-10-CM | POA: Insufficient documentation

## 2010-11-29 DIAGNOSIS — IMO0002 Reserved for concepts with insufficient information to code with codable children: Secondary | ICD-10-CM

## 2010-11-29 DIAGNOSIS — IMO0001 Reserved for inherently not codable concepts without codable children: Secondary | ICD-10-CM | POA: Insufficient documentation

## 2010-11-29 DIAGNOSIS — D45 Polycythemia vera: Secondary | ICD-10-CM | POA: Insufficient documentation

## 2010-11-29 DIAGNOSIS — M810 Age-related osteoporosis without current pathological fracture: Secondary | ICD-10-CM | POA: Insufficient documentation

## 2010-11-29 DIAGNOSIS — E119 Type 2 diabetes mellitus without complications: Secondary | ICD-10-CM | POA: Insufficient documentation

## 2010-11-29 LAB — BASIC METABOLIC PANEL
BUN: 8 mg/dL (ref 6–23)
CO2: 32 mEq/L (ref 19–32)
Calcium: 9.3 mg/dL (ref 8.4–10.5)
Chloride: 97 mEq/L (ref 96–112)
Creatinine, Ser: 0.72 mg/dL (ref 0.50–1.10)
Glucose, Bld: 181 mg/dL — ABNORMAL HIGH (ref 70–99)

## 2010-11-29 LAB — CBC
HCT: 43.8 % (ref 36.0–46.0)
Hemoglobin: 15.6 g/dL — ABNORMAL HIGH (ref 12.0–15.0)
MCH: 30.5 pg (ref 26.0–34.0)
MCHC: 35.6 g/dL (ref 30.0–36.0)
MCV: 85.7 fL (ref 78.0–100.0)
RDW: 13.9 % (ref 11.5–15.5)

## 2010-11-29 LAB — PROTIME-INR: INR: 0.95 (ref 0.00–1.49)

## 2010-11-29 LAB — GLUCOSE, CAPILLARY: Glucose-Capillary: 179 mg/dL — ABNORMAL HIGH (ref 70–99)

## 2010-12-19 ENCOUNTER — Ambulatory Visit (HOSPITAL_COMMUNITY): Payer: Medicare Other

## 2010-12-21 ENCOUNTER — Other Ambulatory Visit: Payer: Self-pay | Admitting: Interventional Radiology

## 2010-12-21 DIAGNOSIS — I83812 Varicose veins of left lower extremities with pain: Secondary | ICD-10-CM

## 2011-01-18 ENCOUNTER — Telehealth (HOSPITAL_COMMUNITY): Payer: Self-pay

## 2011-01-18 ENCOUNTER — Encounter: Payer: Self-pay | Admitting: Radiology

## 2011-04-16 ENCOUNTER — Other Ambulatory Visit: Payer: Self-pay | Admitting: Internal Medicine

## 2011-04-16 MED ORDER — VENLAFAXINE HCL ER 75 MG PO CP24: 75.0000 mg | ORAL_CAPSULE | Freq: Every day | ORAL | Status: DC

## 2011-05-09 NOTE — Telephone Encounter (Signed)
                           Contacts         Type  Contact  Phone    01/18/2011 12:58 PM  Phone (Incoming)  Giana, Castner (Self)  [phone] (H)    Returned her call

## 2012-12-02 ENCOUNTER — Other Ambulatory Visit: Payer: Self-pay | Admitting: Internal Medicine

## 2012-12-02 ENCOUNTER — Ambulatory Visit
Admission: RE | Admit: 2012-12-02 | Discharge: 2012-12-02 | Disposition: A | Discharge status: Home / Self Care | Payer: Medicare Other | Source: Ambulatory Visit | Attending: Internal Medicine | Admitting: Internal Medicine

## 2012-12-02 DIAGNOSIS — R270 Ataxia, unspecified: Secondary | ICD-10-CM

## 2013-02-16 ENCOUNTER — Emergency Department (HOSPITAL_COMMUNITY)
Admission: EM | Admit: 2013-02-16 | Discharge: 2013-02-16 | Disposition: A | Discharge status: Home / Self Care | Payer: Medicare Other | Attending: Emergency Medicine | Admitting: Emergency Medicine

## 2013-02-16 ENCOUNTER — Emergency Department (HOSPITAL_COMMUNITY): Payer: Medicare Other

## 2013-02-16 DIAGNOSIS — S52122A Displaced fracture of head of left radius, initial encounter for closed fracture: Secondary | ICD-10-CM

## 2013-02-16 DIAGNOSIS — Y929 Unspecified place or not applicable: Secondary | ICD-10-CM | POA: Insufficient documentation

## 2013-02-16 DIAGNOSIS — W1809XA Striking against other object with subsequent fall, initial encounter: Secondary | ICD-10-CM | POA: Insufficient documentation

## 2013-02-16 DIAGNOSIS — Y939 Activity, unspecified: Secondary | ICD-10-CM | POA: Insufficient documentation

## 2013-02-16 DIAGNOSIS — Z7982 Long term (current) use of aspirin: Secondary | ICD-10-CM | POA: Insufficient documentation

## 2013-02-16 DIAGNOSIS — S42212A Unspecified displaced fracture of surgical neck of left humerus, initial encounter for closed fracture: Secondary | ICD-10-CM

## 2013-02-16 DIAGNOSIS — S52123A Displaced fracture of head of unspecified radius, initial encounter for closed fracture: Secondary | ICD-10-CM | POA: Insufficient documentation

## 2013-02-16 DIAGNOSIS — R42 Dizziness and giddiness: Secondary | ICD-10-CM | POA: Insufficient documentation

## 2013-02-16 DIAGNOSIS — S42213A Unspecified displaced fracture of surgical neck of unspecified humerus, initial encounter for closed fracture: Secondary | ICD-10-CM | POA: Insufficient documentation

## 2013-02-16 DIAGNOSIS — S0990XA Unspecified injury of head, initial encounter: Secondary | ICD-10-CM | POA: Insufficient documentation

## 2013-02-16 DIAGNOSIS — Z79899 Other long term (current) drug therapy: Secondary | ICD-10-CM | POA: Insufficient documentation

## 2013-02-16 DIAGNOSIS — W19XXXA Unspecified fall, initial encounter: Secondary | ICD-10-CM

## 2013-02-16 LAB — CBC WITH DIFFERENTIAL/PLATELET
Basophils Absolute: 0 10*3/uL (ref 0.0–0.1)
Basophils Relative: 0 % (ref 0–1)
Eosinophils Absolute: 0 10*3/uL (ref 0.0–0.7)
Eosinophils Relative: 0 % (ref 0–5)
HCT: 39.9 % (ref 36.0–46.0)
Hemoglobin: 14.6 g/dL (ref 12.0–15.0)
Lymphocytes Relative: 15 % (ref 12–46)
Lymphs Abs: 2.2 10*3/uL (ref 0.7–4.0)
MCH: 30.9 pg (ref 26.0–34.0)
MCHC: 36.6 g/dL — ABNORMAL HIGH (ref 30.0–36.0)
MCV: 84.4 fL (ref 78.0–100.0)
Monocytes Absolute: 1.1 10*3/uL — ABNORMAL HIGH (ref 0.1–1.0)
Monocytes Relative: 8 % (ref 3–12)
Neutro Abs: 11.5 10*3/uL — ABNORMAL HIGH (ref 1.7–7.7)
Neutrophils Relative %: 78 % — ABNORMAL HIGH (ref 43–77)
Platelets: 228 10*3/uL (ref 150–400)
RBC: 4.73 MIL/uL (ref 3.87–5.11)
RDW: 13.1 % (ref 11.5–15.5)
WBC: 14.9 10*3/uL — ABNORMAL HIGH (ref 4.0–10.5)

## 2013-02-16 LAB — COMPREHENSIVE METABOLIC PANEL
ALT: 34 U/L (ref 0–35)
AST: 40 U/L — ABNORMAL HIGH (ref 0–37)
Albumin: 4.2 g/dL (ref 3.5–5.2)
Alkaline Phosphatase: 138 U/L — ABNORMAL HIGH (ref 39–117)
BUN: 12 mg/dL (ref 6–23)
CO2: 30 mEq/L (ref 19–32)
Calcium: 10 mg/dL (ref 8.4–10.5)
Chloride: 94 mEq/L — ABNORMAL LOW (ref 96–112)
Creatinine, Ser: 0.88 mg/dL (ref 0.50–1.10)
GFR calc Af Amer: 72 mL/min — ABNORMAL LOW (ref >90)
GFR calc non Af Amer: 62 mL/min — ABNORMAL LOW (ref >90)
Glucose, Bld: 110 mg/dL — ABNORMAL HIGH (ref 70–99)
Potassium: 3.4 mEq/L — ABNORMAL LOW (ref 3.5–5.1)
Sodium: 134 mEq/L — ABNORMAL LOW (ref 135–145)
Total Bilirubin: 1 mg/dL (ref 0.3–1.2)
Total Protein: 7.3 g/dL (ref 6.0–8.3)

## 2013-02-16 LAB — URINALYSIS, ROUTINE W REFLEX MICROSCOPIC
Bilirubin Urine: NEGATIVE
Glucose, UA: NEGATIVE mg/dL
Hgb urine dipstick: NEGATIVE
Ketones, ur: NEGATIVE mg/dL
Leukocytes, UA: NEGATIVE
Nitrite: NEGATIVE
Protein, ur: NEGATIVE mg/dL
Specific Gravity, Urine: 1.013 (ref 1.005–1.030)
Urobilinogen, UA: 1 mg/dL (ref 0.0–1.0)
pH: 7.5 (ref 5.0–8.0)

## 2013-02-16 LAB — CK: Total CK: 240 U/L — ABNORMAL HIGH (ref 7–177)

## 2013-02-16 MED ORDER — KETOROLAC TROMETHAMINE 30 MG/ML IJ SOLN
15.0000 mg | Freq: Once | INTRAMUSCULAR | Status: AC
  Administered 2013-02-16: 15 mg via INTRAVENOUS
  Filled 2013-02-16: qty 1

## 2013-02-16 MED ORDER — KETOROLAC TROMETHAMINE 60 MG/2ML IM SOLN
60.0000 mg | Freq: Once | INTRAMUSCULAR | Status: AC
  Administered 2013-02-16: 60 mg via INTRAMUSCULAR
  Filled 2013-02-16: qty 2

## 2013-02-16 MED ORDER — TRAMADOL HCL 50 MG PO TABS: 50.0000 mg | ORAL_TABLET | Freq: Four times a day (QID) | ORAL | Status: DC | PRN

## 2013-02-16 MED ORDER — MORPHINE SULFATE 4 MG/ML IJ SOLN: 4.0000 mg | Freq: Once | INTRAMUSCULAR | Status: DC

## 2013-02-16 NOTE — ED Provider Notes (Signed)
CSN: [id number]     Arrival date & time 02/16/13  1217 History   First MD Initiated Contact with Patient 02/16/13 1239     Chief Complaint  Patient presents with  . Fall  . Head Injury  . Arm Pain   (Consider location/radiation/quality/duration/timing/severity/associated sxs/prior Treatment) HPI Patient presents emergency department after 2 falls occurred last night, and this morning.  The patient, states she has a history of vertigo and felt dizzy like with her previous vertiginous symptoms.  Patient, states, that she did not have syncope, nausea, vomiting, diarrhea, headache, blurred vision, weakness, numbness, chest pain, shortness of breath, or abdominal pain.  Patient also denies fevers.  Patient, states, that she did not take any medications prior to arrival.  States movement and palpation of her arm pain, worse. No past medical history on file. No past surgical history on file. No family history on file. History  Substance Use Topics  . Smoking status: Never Smoker   . Smokeless tobacco: Never Used  . Alcohol Use: No   OB History   Grav Para Term Preterm Abortions TAB SAB Ect Mult Living                 Review of Systems All other systems negative except as documented in the HPI. All pertinent positives and negatives as reviewed in the HPI. Allergies  Codeine; Cortisone; and Other  Home Medications   Current Outpatient Rx  Name  Route  Sig  Dispense  Refill  . acetaminophen (TYLENOL) 500 MG tablet   Oral   Take 500 mg by mouth every 6 (six) hours as needed for mild pain or moderate pain.         Marland Kitchen ALPRAZolam (XANAX) 1 MG tablet   Oral   Take 1 mg by mouth at bedtime as needed for anxiety.         Marland Kitchen aspirin 81 MG tablet   Oral   Take 81 mg by mouth daily.         . Calcium Carb-Cholecalciferol 600-800 MG-UNIT TABS   Oral   Take 1 tablet by mouth daily.         . fexofenadine (ALLEGRA) 180 MG tablet   Oral   Take 180 mg by mouth daily.         Marland Kitchen  gabapentin (NEURONTIN) 300 MG capsule   Oral   Take 300 mg by mouth 3 (three) times daily.         Marland Kitchen levothyroxine (SYNTHROID, LEVOTHROID) 112 MCG tablet   Oral   Take 112 mcg by mouth daily before breakfast.         . meclizine (ANTIVERT) 25 MG tablet   Oral   Take 25 mg by mouth 3 (three) times daily as needed for dizziness.         . montelukast (SINGULAIR) 10 MG tablet   Oral   Take 10 mg by mouth at bedtime.         . Multiple Vitamins-Minerals (CENTRUM SILVER ADULT 50+ PO)   Oral   Take 1 tablet by mouth daily.         . Omega-3 Fatty Acids (FISH OIL PO)   Oral   Take 1 tablet by mouth daily.         . pantoprazole (PROTONIX) 40 MG tablet   Oral   Take 40 mg by mouth daily.         . promethazine (PHENERGAN) 25 MG tablet   Oral  Take 25 mg by mouth every 6 (six) hours as needed for nausea or vomiting.         . ranitidine (ZANTAC) 150 MG tablet   Oral   Take 150 mg by mouth daily.         . traZODone (DESYREL) 150 MG tablet   Oral   Take 50-150 mg by mouth at bedtime.         . triamterene-hydrochlorothiazide (MAXZIDE) 75-50 MG per tablet   Oral   Take 1 tablet by mouth daily.         Marland Kitchen venlafaxine (EFFEXOR XR) 75 MG 24 hr capsule   Oral   Take 1 capsule (75 mg total) by mouth daily.   30 capsule   2   . venlafaxine (EFFEXOR) 100 MG tablet   Oral   Take 100 mg by mouth 3 (three) times daily.         . Vitamin D, Ergocalciferol, (DRISDOL) 50000 UNITS CAPS capsule   Oral   Take 50,000 Units by mouth every 14 (fourteen) days.          BP 124/74  Pulse 77  Temp(Src) 98.4 F (36.9 C) (Oral)  Resp 18  SpO2 97% Physical Exam  Nursing note and vitals reviewed. Constitutional: She is oriented to person, place, and time. She appears well-developed and well-nourished. No distress.  HENT:  Head: Normocephalic and atraumatic.  Mouth/Throat: Oropharynx is clear and moist.  Eyes: Pupils are equal, round, and reactive to light.   Neck: Normal range of motion. Neck supple.  Cardiovascular: Normal rate, regular rhythm and normal heart sounds.  Exam reveals no gallop.   No murmur heard. Pulmonary/Chest: Effort normal and breath sounds normal. No respiratory distress.  Abdominal: Soft. Bowel sounds are normal. She exhibits no distension.  Neurological: She is alert and oriented to person, place, and time. She exhibits normal muscle tone. Coordination normal.  Skin: Skin is warm and dry. No erythema.    ED Course  Procedures (including critical care time) Labs Review Labs Reviewed  CBC WITH DIFFERENTIAL - Abnormal; Notable for the following:    WBC 14.9 (*)    MCHC 36.6 (*)    Neutrophils Relative % 78 (*)    Neutro Abs 11.5 (*)    Monocytes Absolute 1.1 (*)    All other components within normal limits  COMPREHENSIVE METABOLIC PANEL - Abnormal; Notable for the following:    Sodium 134 (*)    Potassium 3.4 (*)    Chloride 94 (*)    Glucose, Bld 110 (*)    AST 40 (*)    Alkaline Phosphatase 138 (*)    GFR calc non Af Amer 62 (*)    GFR calc Af Amer 72 (*)    All other components within normal limits  CK - Abnormal; Notable for the following:    Total CK 240 (*)    All other components within normal limits  URINALYSIS, ROUTINE W REFLEX MICROSCOPIC   Imaging Review Dg Elbow Complete Left  02/16/2013   CLINICAL DATA:  Fall.  Left shoulder pain.  EXAM: LEFT ELBOW - COMPLETE 3+ VIEW  COMPARISON:  None.  FINDINGS: The patient has a minimally impacted fracture of the radial head. The fracture appears oriented in the sagittal plane. The elbow is located and no other fracture is identified.  IMPRESSION: Minimally impacted fracture left radial head.   Electronically Signed   By: Drusilla Kanner M.D.   On: 02/16/2013 15:00   Dg  Wrist Complete Right  02/16/2013   CLINICAL DATA:  Fall.  Right wrist laceration pain.  EXAM: RIGHT WRIST - COMPLETE 3+ VIEW  COMPARISON:  None.  FINDINGS: There appears to be a laceration  over the dorsum of the wrist. No underlying acute bony or joint abnormality is identified. Old healed distal radius fracture with fixation hardware in place noted.  IMPRESSION: Laceration without underlying acute bony or joint abnormality.   Electronically Signed   By: Drusilla Kanner M.D.   On: 02/16/2013 15:02   Ct Head Wo Contrast  02/16/2013   CLINICAL DATA:  Fall with a blow to the head.  EXAM: CT HEAD WITHOUT CONTRAST  CT CERVICAL SPINE WITHOUT CONTRAST  TECHNIQUE: Multidetector CT imaging of the head and cervical spine was performed following the standard protocol without intravenous contrast. Multiplanar CT image reconstructions of the cervical spine were also generated.  COMPARISON:  Head CT scan 12/02/2012.  FINDINGS: CT HEAD FINDINGS  There is some cortical atrophy and chronic microvascular ischemic change. No evidence of acute abnormality including infarction, hemorrhage, mass lesion, mass effect, midline shift or abnormal extra-axial fluid collection is identified. The calvarium is intact. There is no hydrocephalus or pneumocephalus.  CT CERVICAL SPINE FINDINGS  No cervical spine fracture or malalignment is identified. There is loss of disc space height and endplate spurring at C5-6. Multilevel facet degenerative change is identified. Lung apices are clear.  IMPRESSION: No acute finding head or cervical spine.  No change in atrophy and chronic microvascular ischemic change.  Cervical degenerative disease appearing worst at C5-6.   Electronically Signed   By: Drusilla Kanner M.D.   On: 02/16/2013 14:41   Ct Cervical Spine Wo Contrast  02/16/2013   CLINICAL DATA:  Fall with a blow to the head.  EXAM: CT HEAD WITHOUT CONTRAST  CT CERVICAL SPINE WITHOUT CONTRAST  TECHNIQUE: Multidetector CT imaging of the head and cervical spine was performed following the standard protocol without intravenous contrast. Multiplanar CT image reconstructions of the cervical spine were also generated.  COMPARISON:   Head CT scan 12/02/2012.  FINDINGS: CT HEAD FINDINGS  There is some cortical atrophy and chronic microvascular ischemic change. No evidence of acute abnormality including infarction, hemorrhage, mass lesion, mass effect, midline shift or abnormal extra-axial fluid collection is identified. The calvarium is intact. There is no hydrocephalus or pneumocephalus.  CT CERVICAL SPINE FINDINGS  No cervical spine fracture or malalignment is identified. There is loss of disc space height and endplate spurring at C5-6. Multilevel facet degenerative change is identified. Lung apices are clear.  IMPRESSION: No acute finding head or cervical spine.  No change in atrophy and chronic microvascular ischemic change.  Cervical degenerative disease appearing worst at C5-6.   Electronically Signed   By: Drusilla Kanner M.D.   On: 02/16/2013 14:41   Dg Humerus Left  02/16/2013   CLINICAL DATA:  Fall.  Left shoulder pain.  EXAM: LEFT HUMERUS - 2+ VIEW  COMPARISON:  None.  FINDINGS: Surgical neck fracture of the left humerus is identified. No other acute bony or joint abnormality is seen.  IMPRESSION: Surgical neck fracture left humerus.   Electronically Signed   By: Drusilla Kanner M.D.   On: 02/16/2013 14:59   Dg Finger Middle Right  02/16/2013   CLINICAL DATA:  Fall.  Left shoulder pain.  EXAM: RIGHT MIDDLE FINGER 2+V  COMPARISON:  None.  FINDINGS: No acute bony or joint abnormality is identified. There is advanced osteoarthritis about the DIP and  PIP joints of the long and ring fingers. Soft tissue structures are unremarkable.  IMPRESSION: No acute finding.  Marked osteoarthritis.   Electronically Signed   By: Drusilla Kanner M.D.   On: 02/16/2013 15:02    EKG Interpretation    Date/Time:  Sunday February 16 2013 12:36:17 EST Ventricular Rate:  75 PR Interval:  174 QRS Duration: 88 QT Interval:  404 QTC Calculation: 451 R Axis:   -18 Text Interpretation:  Sinus rhythm Borderline left axis deviation Low voltage,  precordial leads No significant change was found Confirmed by CAMPOS  MD, Caryn Bee ([id number]) on 02/16/2013 1:03:08 PM           Patient is set up with case management for home health.  Patient be placed in a short-arm splint with splint.  Patient is agreeable to plan along with family.  Patient is stable here in the emergency department.   Carlyle Dolly, PA-C 02/16/13 [phone]

## 2013-02-16 NOTE — ED Notes (Signed)
Bed: WA09 Expected date: 02/16/13 Expected time: 12:04 PM Means of arrival: Ambulance Comments: Fall, wrist injury

## 2013-02-16 NOTE — Progress Notes (Signed)
Pt requested NCM speak to family to arrange [facility]. Family currently not in room to answer questions. Will wait for family to return. Isidoro Donning RN CCM Case Mgmt phone [phone]

## 2013-02-16 NOTE — ED Provider Notes (Signed)
Medical screening examination/treatment/procedure(s) were conducted as a shared visit with non-physician practitioner(s) and myself.  I personally evaluated the patient during the encounter.  EKG Interpretation    Date/Time:  Sunday February 16 2013 12:36:17 EST Ventricular Rate:  75 PR Interval:  174 QRS Duration: 88 QT Interval:  404 QTC Calculation: 451 R Axis:   -18 Text Interpretation:  Sinus rhythm Borderline left axis deviation Low voltage, precordial leads No significant change was found Confirmed by Kahil Agner  MD, Jewelz Kobus ([id number]) on 02/16/2013 1:03:08 PM            Patient to followup with Dr. Lars Mage gold.  Given the fact that she lives at home alone and ambulates around with a walker this will be much more difficult given her nonweightbearing status of her left upper extremity.  Pain treated in the emergency department.  Case management will be involved to provide additional assistance for home health needs.  Family is involved and understands the difficulty of the situation but is agreeable to help.  Lyanne Co, MD 02/16/13 [phone]

## 2013-02-16 NOTE — ED Notes (Signed)
Patient waiting to meet with case manager and ortho technician.

## 2013-02-16 NOTE — ED Notes (Signed)
Pt now complains of nausea, onset 1247 today.

## 2013-02-16 NOTE — ED Notes (Addendum)
Per EMS pt with Hx of vertigo, at 1930 yesterday pt became dizzy and fell onto floor, hit back of head, had neighbor come help her off floor. This am pt woke up with pain to left forearm. Pt states she cannot have narcotic medications. Patient reports falling again this morning, then crawling to the phone to call her neighbor for help again. Patient states that she falls chronically and is unsure whether past episodes are also due to her chronic dizziness.

## 2013-02-17 NOTE — Progress Notes (Signed)
   CARE MANAGEMENT NOTE 02/17/2013  Patient:  Erika Conley, Erika Conley   Account Number:  [id number]  Date Initiated:  02/17/2013  Documentation initiated by:  [facility]  Subjective/Objective Assessment:   falls     Action/Plan:   lives alone, son, and dtr, Erika Conley assist at home   Anticipated DC Date:  02/17/2013   Anticipated DC Plan:  HOME W HOME HEALTH SERVICES      DC Planning Services  CM consult      [facility] Choice  HOME HEALTH   Choice offered to / List presented to:  C-1 Patient        HH arranged  HH-1 RN  HH-2 PT  HH-3 OT      [facility] agency  [facility]   Status of service:  Completed, signed off Medicare Important Message given?   (If response is "NO", the following Medicare IM given date fields will be blank) Date Medicare IM given:   Date Additional Medicare IM given:    Discharge Disposition:  HOME W HOME HEALTH SERVICES  Per UR Regulation:    If discussed at Long Length of Stay Meetings, dates discussed:    Comments:  02/17/2013 1400 NCM received call back from dtr, Erika Conley. States pt has RW and cane at home. States she lives alone in her apt and wants to remain in apt. States her brother is currently staying with pt and they alternate caring for pt. She does not want to leave her apt. Dtr is agreeable to [facility] for [facility]. Explained [facility] requested pt do follow up with PCP before they will service her for [facility]. Contacted [facility] for [facility] for [facility] RN, PT and OT. Dtr states pt is getting confused with her medications. Made [facility] rep aware for [facility] RN for follow up on meds complaince and education. Provided info on private duty care agencies. Dtr requested info faxed to office. Faxed list of private duty care agencies to dtr at fax # [phone].  Isidoro Donning RN CCM Case Mgmt phone [phone]  02/17/2013 1115 Received call back from pt and states she had [facility] in the past. She was closed out from their services. NCM contacted [place] and they did not accept  referral. Contacted pt to make aware. Offered choice for [facility]. Pt agreeable to [facility] for HH. States NCM will notify her dtr. Pt provided contacted number for dtr. Erika Conley at work, [phone]. Attempted call to work number. Left message on dtr's cell voice mail. Isidoro Donning RN CCM Case Mgmt phone [phone]   02/17/2013 11:09 AM  NCM contacted Erika Conley, dtr [phone] to arrange [facility]. Left message on voicemail. Isidoro Donning RN CCM Case Mgmt phone [phone]   02/16/2013 4:17 PM   Pt requested NCM speak to family to arrange [facility]. Family currently not in room to answer questions. Will wait for family to return. Isidoro Donning RN CCM Case Mgmt phone [phone]

## 2013-02-17 NOTE — Progress Notes (Signed)
NCM contacted Loetta Rough, dtr [phone] to arrange [facility]. Left message on voicemail. Isidoro Donning RN CCM Case Mgmt phone [phone]

## 2013-03-02 ENCOUNTER — Other Ambulatory Visit (HOSPITAL_COMMUNITY): Payer: Medicare Other

## 2013-03-02 ENCOUNTER — Encounter (HOSPITAL_COMMUNITY): Payer: Self-pay | Admitting: Emergency Medicine

## 2013-03-02 ENCOUNTER — Emergency Department (HOSPITAL_COMMUNITY): Payer: Medicare Other

## 2013-03-02 ENCOUNTER — Emergency Department (HOSPITAL_COMMUNITY)
Admission: EM | Admit: 2013-03-02 | Discharge: 2013-03-02 | Disposition: A | Discharge status: Home / Self Care | Payer: Medicare Other | Attending: Emergency Medicine | Admitting: Emergency Medicine

## 2013-03-02 DIAGNOSIS — Z79899 Other long term (current) drug therapy: Secondary | ICD-10-CM | POA: Insufficient documentation

## 2013-03-02 DIAGNOSIS — W1809XA Striking against other object with subsequent fall, initial encounter: Secondary | ICD-10-CM | POA: Insufficient documentation

## 2013-03-02 DIAGNOSIS — K219 Gastro-esophageal reflux disease without esophagitis: Secondary | ICD-10-CM | POA: Insufficient documentation

## 2013-03-02 DIAGNOSIS — S22009A Unspecified fracture of unspecified thoracic vertebra, initial encounter for closed fracture: Secondary | ICD-10-CM | POA: Insufficient documentation

## 2013-03-02 DIAGNOSIS — M549 Dorsalgia, unspecified: Secondary | ICD-10-CM

## 2013-03-02 DIAGNOSIS — Y9389 Activity, other specified: Secondary | ICD-10-CM | POA: Insufficient documentation

## 2013-03-02 DIAGNOSIS — Z8781 Personal history of (healed) traumatic fracture: Secondary | ICD-10-CM | POA: Insufficient documentation

## 2013-03-02 DIAGNOSIS — S22000A Wedge compression fracture of unspecified thoracic vertebra, initial encounter for closed fracture: Secondary | ICD-10-CM

## 2013-03-02 DIAGNOSIS — E079 Disorder of thyroid, unspecified: Secondary | ICD-10-CM | POA: Insufficient documentation

## 2013-03-02 DIAGNOSIS — R42 Dizziness and giddiness: Secondary | ICD-10-CM | POA: Insufficient documentation

## 2013-03-02 DIAGNOSIS — Z9889 Other specified postprocedural states: Secondary | ICD-10-CM | POA: Insufficient documentation

## 2013-03-02 DIAGNOSIS — M129 Arthropathy, unspecified: Secondary | ICD-10-CM | POA: Insufficient documentation

## 2013-03-02 DIAGNOSIS — Z9181 History of falling: Secondary | ICD-10-CM | POA: Insufficient documentation

## 2013-03-02 DIAGNOSIS — Y92009 Unspecified place in unspecified non-institutional (private) residence as the place of occurrence of the external cause: Secondary | ICD-10-CM | POA: Insufficient documentation

## 2013-03-02 DIAGNOSIS — I10 Essential (primary) hypertension: Secondary | ICD-10-CM | POA: Insufficient documentation

## 2013-03-02 DIAGNOSIS — Z7982 Long term (current) use of aspirin: Secondary | ICD-10-CM | POA: Insufficient documentation

## 2013-03-02 HISTORY — DX: Unspecified osteoarthritis, unspecified site: M19.90

## 2013-03-02 HISTORY — DX: Fracture of unspecified carpal bone, right wrist, initial encounter for closed fracture: S62.101A

## 2013-03-02 HISTORY — DX: Disorder of thyroid, unspecified: E07.9

## 2013-03-02 HISTORY — DX: Essential (primary) hypertension: I10

## 2013-03-02 HISTORY — DX: Fracture of unspecified part of unspecified clavicle, initial encounter for closed fracture: S42.009A

## 2013-03-02 HISTORY — DX: Repeated falls: R29.6

## 2013-03-02 HISTORY — DX: Personal history of (healed) traumatic fracture: Z87.81

## 2013-03-02 LAB — COMPREHENSIVE METABOLIC PANEL
ALBUMIN: 3.7 g/dL (ref 3.5–5.2)
ALT: 19 U/L (ref 0–35)
AST: 20 U/L (ref 0–37)
Alkaline Phosphatase: 161 U/L — ABNORMAL HIGH (ref 39–117)
BUN: 9 mg/dL (ref 6–23)
CALCIUM: 9.4 mg/dL (ref 8.4–10.5)
CO2: 27 mEq/L (ref 19–32)
CREATININE: 0.85 mg/dL (ref 0.50–1.10)
Chloride: 98 mEq/L (ref 96–112)
GFR calc Af Amer: 75 mL/min — ABNORMAL LOW (ref >90)
GFR calc non Af Amer: 65 mL/min — ABNORMAL LOW (ref >90)
Glucose, Bld: 110 mg/dL — ABNORMAL HIGH (ref 70–99)
Potassium: 3.8 mEq/L (ref 3.7–5.3)
Sodium: 140 mEq/L (ref 137–147)
TOTAL PROTEIN: 7.1 g/dL (ref 6.0–8.3)
Total Bilirubin: 0.8 mg/dL (ref 0.3–1.2)

## 2013-03-02 LAB — CBC WITH DIFFERENTIAL/PLATELET
BASOS ABS: 0 10*3/uL (ref 0.0–0.1)
BASOS PCT: 0 % (ref 0–1)
EOS ABS: 0 10*3/uL (ref 0.0–0.7)
EOS PCT: 0 % (ref 0–5)
HEMATOCRIT: 39.4 % (ref 36.0–46.0)
HEMOGLOBIN: 13.8 g/dL (ref 12.0–15.0)
Lymphocytes Relative: 17 % (ref 12–46)
Lymphs Abs: 2.2 10*3/uL (ref 0.7–4.0)
MCH: 30.6 pg (ref 26.0–34.0)
MCHC: 35 g/dL (ref 30.0–36.0)
MCV: 87.4 fL (ref 78.0–100.0)
MONO ABS: 0.9 10*3/uL (ref 0.1–1.0)
MONOS PCT: 7 % (ref 3–12)
Neutro Abs: 9.6 10*3/uL — ABNORMAL HIGH (ref 1.7–7.7)
Neutrophils Relative %: 76 % (ref 43–77)
Platelets: 280 10*3/uL (ref 150–400)
RBC: 4.51 MIL/uL (ref 3.87–5.11)
RDW: 13.8 % (ref 11.5–15.5)
WBC: 12.7 10*3/uL — ABNORMAL HIGH (ref 4.0–10.5)

## 2013-03-02 LAB — URINALYSIS W MICROSCOPIC + REFLEX CULTURE
Bilirubin Urine: NEGATIVE
GLUCOSE, UA: NEGATIVE mg/dL
HGB URINE DIPSTICK: NEGATIVE
Ketones, ur: NEGATIVE mg/dL
Nitrite: NEGATIVE
PROTEIN: NEGATIVE mg/dL
SPECIFIC GRAVITY, URINE: 1.006 (ref 1.005–1.030)
Urobilinogen, UA: 0.2 mg/dL (ref 0.0–1.0)
pH: 7 (ref 5.0–8.0)

## 2013-03-02 MED ORDER — FENTANYL CITRATE 0.05 MG/ML IJ SOLN
50.0000 ug | Freq: Once | INTRAMUSCULAR | Status: AC
  Administered 2013-03-02: 50 ug via INTRAVENOUS
  Filled 2013-03-02: qty 2

## 2013-03-02 MED ORDER — TRAMADOL HCL 50 MG PO TABS: 50.0000 mg | ORAL_TABLET | Freq: Four times a day (QID) | ORAL | Status: DC | PRN

## 2013-03-02 MED ORDER — SODIUM CHLORIDE 0.9 % IV BOLUS (SEPSIS)
500.0000 mL | INTRAVENOUS | Status: AC
  Administered 2013-03-02: 500 mL via INTRAVENOUS

## 2013-03-02 MED ORDER — ACETAMINOPHEN 325 MG PO TABS
650.0000 mg | ORAL_TABLET | Freq: Once | ORAL | Status: AC
  Administered 2013-03-02: 650 mg via ORAL
  Filled 2013-03-02: qty 2

## 2013-03-02 NOTE — ED Notes (Signed)
Pt from home via [facility] with c/o back pain and dizziness.  Pt was seen here 2 weeks ago for fall and this past Thursday.  Pt has a broken clavicle and elbow.  Hx of vertigo.  Pt's account of hx shows some confusion and anxiety.  Reports she also feels like she is "withdrawling from xanax."  PCP took her off this xanax about 2 weeks ago and she started Buspar.  Pt in NAD, A&O.

## 2013-03-02 NOTE — ED Notes (Signed)
Patient transported to MRI 

## 2013-03-02 NOTE — ED Notes (Signed)
Pt returned from radiology.

## 2013-03-02 NOTE — Discharge Instructions (Signed)
Back Injury Prevention The following tips can help you to prevent a back injury. PHYSICAL FITNESS  Exercise often. Try to develop strong stomach (abdominal) muscles.  Do aerobic exercises often. This includes walking, jogging, biking, swimming.  Do exercises that help with balance and strength often. This includes tai chi and yoga.  Stretch before and after you exercise.  Keep a healthy weight. DIET   Ask your doctor how much calcium and vitamin D you need every day.  Include calcium in your diet. Foods high in calcium include dairy products; green, leafy vegetables; and products with calcium added (fortified).  Include vitamin D in your diet. Foods high in vitamin D include milk and products with vitamin D added.  Think about taking a multivitamin or other nutritional products called " supplements."  Stop smoking if you smoke. POSTURE   Sit and stand up straight. Avoid leaning forward or hunching over.  Choose chairs that support your lower back.  If you work at a desk:  Sit close to your work so you do not lean over.  Keep your chin tucked in.  Keep your neck drawn back.  Keep your elbows bent at a right angle. Your arms should look like the letter "L."  Sit high and close to the steering wheel when you drive. Add low back support to your car seat if needed.  Avoid sitting or standing in one position for too long. Get up and move around every hour. Take breaks if you are driving for a long time.  Sleep on your side with your knees slightly bent. You can also sleep on your back with a pillow under your knees. Do not sleep on your stomach. LIFTING, TWISTING, AND REACHING  Avoid heavy lifting, especially lifting over and over again. If you must do heavy lifting:  Stretch before lifting.  Work slowly.  Rest between lifts.  Use carts and dollies to move objects when possible.  Make several small trips instead of carrying 1 heavy load.  Ask for help when you  need it.  Ask for help when moving big, awkward objects.  Follow these steps when lifting:  Stand with your feet shoulder-width apart.  Get as close to the object as you can. Do not pick up heavy objects that are far from your body.  Use handles or lifting straps when possible.  Bend at your knees. Squat down, but keep your heels off the floor.  Keep your shoulders back, your chin tucked in, and your back straight.  Lift the object slowly. Tighten the muscles in your legs, stomach, and butt. Keep the object as close to the center of your body as possible.  Reverse these directions when you put a load down.  Do not:  Lift the object above your waist.  Twist at the waist while lifting or carrying a load. Move your feet if you need to turn, not your waist.  Bend over without bending at your knees.  Avoid reaching over your head, across a table, or for an object on a high surface. OTHER TIPS  Avoid wet floors and keep sidewalks clear of ice.  Do not sleep on a mattress that is too soft or too hard.  Keep items that you use often within easy reach.  Put heavier objects on shelves at waist level. Put lighter objects on lower or higher shelves.  Find ways to lessen your stress. You can try exercise, massage, or relaxation.  Get help for depression or anxiety if  Keep items that you use often within easy reach.  · Put heavier objects on shelves at waist level. Put lighter objects on lower or higher shelves.  · Find ways to lessen your stress. You can try exercise, massage, or relaxation.  · Get help for depression or anxiety if needed.  GET HELP IF:  · You injure your back.  · You have questions about diet, exercise, or other ways to prevent back injuries.  MAKE SURE YOU:  · Understand these instructions.  · Will watch your condition.  · Will get help right away if you are not doing well or get worse.  Document Released: 07/26/2007 Document Revised: 05/01/2011 Document Reviewed: 03/20/2011  [employer]® Patient Information ©2014 [employer].

## 2013-03-02 NOTE — ED Provider Notes (Signed)
CSN: [id number]     Arrival date & time 03/02/13  [id number] History   First MD Initiated Contact with Patient 03/02/13 [phone]     Chief Complaint  Patient presents with  . Back Pain   (Consider location/radiation/quality/duration/timing/severity/associated sxs/prior Treatment) Patient is a 79 y.o. female presenting with back pain. The history is provided by the patient.  Back Pain Location:  Thoracic spine and lumbar spine Quality:  Aching Radiates to:  Does not radiate Pain severity:  Moderate Pain is:  Same all the time Onset quality:  Gradual Duration:  4 days Timing:  Constant Progression:  Unchanged Chronicity:  Chronic Context comment:  Hit her back against the toilet. Relieved by:  Nothing Worsened by:  Nothing tried Ineffective treatments: tramadol. Associated symptoms: no abdominal pain, no chest pain, no dysuria, no fever and no headaches     Past Medical History  Diagnosis Date  . Falls frequently   . Arthritis   . Hypertension   . Fracture, clavicle   . Thyroid disease   . History of fractured vertebra   . Wrist fracture, right    Past Surgical History  Procedure Laterality Date  . Thyroidectomy    . Abdominal hysterectomy    . Back surgery    . Cholecystectomy    . Wrist surgery    . Fracture surgery     History reviewed. No pertinent family history. History  Substance Use Topics  . Smoking status: Never Smoker   . Smokeless tobacco: Never Used  . Alcohol Use: No   OB History   Grav Para Term Preterm Abortions TAB SAB Ect Mult Living                 Review of Systems  Constitutional: Negative for fever and fatigue.  HENT: Negative for congestion and drooling.   Eyes: Negative for pain.  Respiratory: Negative for cough and shortness of breath.   Cardiovascular: Negative for chest pain.  Gastrointestinal: Negative for nausea, vomiting, abdominal pain and diarrhea.  Genitourinary: Negative for dysuria and hematuria.  Musculoskeletal: Positive for  back pain. Negative for gait problem and neck pain.  Skin: Negative for color change.  Neurological: Positive for dizziness. Negative for headaches.  Hematological: Negative for adenopathy.  Psychiatric/Behavioral: Negative for behavioral problems.  All other systems reviewed and are negative.    Allergies  Codeine; Cortisone; and Other  Home Medications   Current Outpatient Rx  Name  Route  Sig  Dispense  Refill  . acetaminophen (TYLENOL) 500 MG tablet   Oral   Take 500 mg by mouth every 6 (six) hours as needed for mild pain or moderate pain.         Marland Kitchen aspirin 81 MG tablet   Oral   Take 81 mg by mouth daily.         . Calcium Carb-Cholecalciferol 600-800 MG-UNIT TABS   Oral   Take 1 tablet by mouth daily.         . fexofenadine (ALLEGRA) 180 MG tablet   Oral   Take 180 mg by mouth daily.         Marland Kitchen gabapentin (NEURONTIN) 300 MG capsule   Oral   Take 300 mg by mouth 3 (three) times daily.         Marland Kitchen levothyroxine (SYNTHROID, LEVOTHROID) 112 MCG tablet   Oral   Take 112 mcg by mouth daily before breakfast.         . meclizine (ANTIVERT) 25 MG tablet  Oral   Take 25 mg by mouth 3 (three) times daily as needed for dizziness.         . montelukast (SINGULAIR) 10 MG tablet   Oral   Take 10 mg by mouth at bedtime.         . Multiple Vitamins-Minerals (CENTRUM SILVER ADULT 50+ PO)   Oral   Take 1 tablet by mouth daily.         . Omega-3 Fatty Acids (FISH OIL PO)   Oral   Take 1 tablet by mouth daily.         . pantoprazole (PROTONIX) 40 MG tablet   Oral   Take 40 mg by mouth daily.         . promethazine (PHENERGAN) 25 MG tablet   Oral   Take 25 mg by mouth every 6 (six) hours as needed for nausea or vomiting.         . ranitidine (ZANTAC) 150 MG tablet   Oral   Take 150 mg by mouth daily.         . traMADol (ULTRAM) 50 MG tablet   Oral   Take 1 tablet (50 mg total) by mouth every 6 (six) hours as needed.   20 tablet   0   .  traZODone (DESYREL) 150 MG tablet   Oral   Take 50-150 mg by mouth at bedtime.         . triamterene-hydrochlorothiazide (MAXZIDE) 75-50 MG per tablet   Oral   Take 1 tablet by mouth daily.         Marland Kitchen venlafaxine (EFFEXOR) 100 MG tablet   Oral   Take 100 mg by mouth 3 (three) times daily.         . Vitamin D, Ergocalciferol, (DRISDOL) 50000 UNITS CAPS capsule   Oral   Take 50,000 Units by mouth every 14 (fourteen) days.          BP 109/59  Pulse 77  Temp(Src) 98.3 F (36.8 C) (Oral)  Resp 15  SpO2 98% Physical Exam  Nursing note and vitals reviewed. Constitutional: She is oriented to person, place, and time. She appears well-developed and well-nourished.  HENT:  Head: Normocephalic.  Mouth/Throat: Oropharynx is clear and moist. No oropharyngeal exudate.  Eyes: Conjunctivae and EOM are normal. Pupils are equal, round, and reactive to light.  Neck: Normal range of motion. Neck supple.  Cardiovascular: Normal rate, regular rhythm, normal heart sounds and intact distal pulses.  Exam reveals no gallop and no friction rub.   No murmur heard. Pulmonary/Chest: Effort normal and breath sounds normal. No respiratory distress. She has no wheezes.  Abdominal: Soft. Bowel sounds are normal. There is no tenderness. There is no rebound and no guarding.  Musculoskeletal: Normal range of motion. She exhibits no edema and no tenderness.  Mild lower thoracic vertebral tenderness to palpation on exam. No other vertebral tenderness to palpation noted.  Neurological: She is alert and oriented to person, place, and time. She has normal strength. No cranial nerve deficit or sensory deficit.  Patient has normal grip strength in the left upper extremity. I did not fully test strength in the left upper extremity due to recent fractures.  Skin: Skin is warm and dry.  Psychiatric: She has a normal mood and affect. Her behavior is normal.    ED Course  Procedures (including critical care  time) Labs Review Labs Reviewed  CBC WITH DIFFERENTIAL - Abnormal; Notable for the following:    WBC  12.7 (*)    Neutro Abs 9.6 (*)    All other components within normal limits  COMPREHENSIVE METABOLIC PANEL - Abnormal; Notable for the following:    Glucose, Bld 110 (*)    Alkaline Phosphatase 161 (*)    GFR calc non Af Amer 65 (*)    GFR calc Af Amer 75 (*)    All other components within normal limits  URINALYSIS W MICROSCOPIC + REFLEX CULTURE - Abnormal; Notable for the following:    APPearance CLOUDY (*)    Leukocytes, UA TRACE (*)    Squamous Epithelial / LPF MANY (*)    All other components within normal limits   Imaging Review Dg Thoracic Spine 2 View  03/02/2013   CLINICAL DATA:  Repeated falls  EXAM: THORACIC SPINE - 2 VIEW  COMPARISON:  12/14/2011  FINDINGS: Three views of thoracic spine submitted. Again noted prior vertebroplasty at T11 and T12 level. There is interval mild compression deformity superior endplate of QA348G vertebral body of indeterminate age. Clinical correlation is necessary. If recent fracture is suspected further correlation with MRI is recommended.  IMPRESSION: Again noted prior vertebroplasty at T11 and T12 level. There is interval mild compression deformity superior endplate of QA348G vertebral body of indeterminate age. Clinical correlation is necessary. If recent fracture is suspected further correlation with MRI is recommended.   Electronically Signed   By: Lahoma Crocker M.D.   On: 03/02/2013 11:46   Dg Lumbar Spine 2-3 Views  03/02/2013   CLINICAL DATA:  Fall with low back pain. History of vertebral augmentation in the past.  EXAM: LUMBAR SPINE - 2-3 VIEW  COMPARISON:  10/09/2012 and prior radiographs  FINDINGS: There is no evidence of acute fracture or subluxation.  Remote compression fractures and vertebral augmentation changes of T11 and T12 again noted.  Diffuse osteopenia is noted.  Mild multilevel degenerative disc disease and moderate facet arthropathy  throughout the lumbar spine noted.  No focal bony lesions are present.  IMPRESSION: No evidence of acute bony abnormality.  Mild multilevel degenerative disc disease and moderate facet arthropathy.  Diffuse osteopenia with remote T11 and T12 compression fractures and vertebral augmentation changes.   Electronically Signed   By: Hassan Rowan M.D.   On: 03/02/2013 11:48   Ct Head Wo Contrast  03/02/2013   CLINICAL DATA:  Falls, dizziness, head injury  EXAM: CT HEAD WITHOUT CONTRAST  TECHNIQUE: Contiguous axial images were obtained from the base of the skull through the vertex without contrast.  COMPARISON:  02/16/2013  FINDINGS: Mild age related brain atrophy. Minor periventricular chronic white matter microvascular ischemic change. No acute intracranial hemorrhage, mass lesion, definite infarction, midline shift, herniation, or hydrocephalus. No extra-axial fluid collection. Cisterns are patent. No cerebellar abnormality. Mastoids and sinuses clear. No depressed skull fracture.  IMPRESSION: Stable brain atrophy. Minor periventricular white matter microvascular change.  No acute finding or interval change by CT.   Electronically Signed   By: Daryll Brod M.D.   On: 03/02/2013 11:08   Mr Thoracic Spine Wo Contrast  03/02/2013   CLINICAL DATA:  Back pain.  EXAM: MRI THORACIC SPINE WITHOUT CONTRAST  TECHNIQUE: Multiplanar, multisequence MR imaging was performed. No intravenous contrast was administered.  COMPARISON:  Radiographs 03/02/2009.  FINDINGS: The sagittal MRI images demonstrate normal overall alignment of the thoracic vertebral bodies. Stable vertebral augmentation changes at T11 and T12 with a vertebral plana appearance at T12 and associated moderate kyphosis. There is a superior endplate fracture involving T10 without significant  compression. There is surrounding paraspinal fluid/hematoma. No retropulsion or canal compromise. The thoracic spinal cord is unremarkable.  There is chronic retropulsion of T12  with 30% canal compromise and mild displacement of the thoracic cord posteriorly. No significant thoracic disc protrusions. Spinal canal is fairly generous and there is no significant spinal stenosis except at T12. There are multilevel dilated thoracic nerve root sheaths.  Scattered benign-appearing hemangiomas are noted at multiple levels. No worrisome bone lesions.  IMPRESSION: Superior endplate fracture at I45 without significant compression and no retropulsion. Small amount of paraspinal hematoma is noted.  Remote fractures and vertebral augmentation changes at T11 and T12. Chronic retropulsion at T12.  Normal appearance of the thoracic spinal cord.   Electronically Signed   By: Kalman Jewels M.D.   On: 03/02/2013 15:40    EKG Interpretation    Date/Time:  Sunday March 02 2013 08:03:28 EST Ventricular Rate:  75 PR Interval:  162 QRS Duration: 80 QT Interval:  388 QTC Calculation: 433 R Axis:   -2 Text Interpretation:  Normal sinus rhythm Normal ECG No significant change since last tracing Confirmed by Valita Righter  MD, Marisah Laker ([id number]) on 03/02/2013 8:10:45 AM            MDM   1. Thoracic compression fracture   2. Back pain    8:00 AM 79 y.o. female with a history of vertigo, falls with recent visit GERD and left arm fracture who presents with back pain. The patient states that 3 days ago she is trying to get up from the toilet and was having difficulty. She notes that she fell backward several times hitting the lower part of her back against the upper part of the toilet while she was trying to get up. She notes worsening back pain since that time. She also notes intermittent dizziness for the last 2 weeks that is consistent with her vertigo. She is afebrile and vital signs are unremarkable here. She is complaining of back pain and left arm pain associated with her recent fractures. She notes only minimal relief with tramadol at home. It is noted in her chart that she gets lightheaded and  sometimes syncopized when she gets narcotics. She insists that she needs something stronger for pain. I agreed to give her a fentanyl here to help manage her pain. We'll also get screening labwork and imaging as she has a history of recent falls.   Question of mild sup endplate deformity of I33. Rads recommending MRI of T-spine. Will order.  MRI shows sup endplate fx of A25, no signif retropulsion, small amount of paraspinal hematoma noted, normal appearing thoracic spinal cord.   Son and daughter now here. They note inc concern about pt's narcotic seeking behavior by them and her pcp. They are reporting pt has taken inc amounts of xanax in the last week. They not that she made no mention to them about the fall she is here for. Son notes pt has home health care daily to help her. I don't think a back brace would benefit the pt at this time. Will recommend conservative tx w/ pain control and rest. Will provide Rx for tramadol which her pcp also prescribes and rec close f/u. Son and daughter reporting pt has taken all her tramadol in the last week. She has an appt w/ her pcp in 2 days.    I discussed the plan w/ the son/daugther.  I have discussed the diagnosis/risks/treatment options with the patient and family and believe the pt to  be eligible for discharge home to follow-up with pcp in 2 days. We also discussed returning to the ED immediately if new or worsening sx occur. We discussed the sx which are most concerning (e.g., worsening pain, numbness, weakness, fever) that necessitate immediate return. Any new prescriptions provided to the patient are listed below.  Discharge Medication List as of 03/02/2013  4:27 PM    START taking these medications   Details  !! traMADol (ULTRAM) 50 MG tablet Take 1 tablet (50 mg total) by mouth every 6 (six) hours as needed., Starting 03/02/2013, Until Discontinued, Print     !! - Potential duplicate medications found. Please discuss with provider.            Blanchard Kelch, MD 03/03/13 1155

## 2013-03-07 ENCOUNTER — Emergency Department (HOSPITAL_COMMUNITY)
Admission: EM | Admit: 2013-03-07 | Discharge: 2013-03-10 | Disposition: A | Discharge status: Home / Self Care | Payer: Medicare Other | Attending: Emergency Medicine | Admitting: Emergency Medicine

## 2013-03-07 ENCOUNTER — Encounter (HOSPITAL_COMMUNITY): Payer: Self-pay | Admitting: Emergency Medicine

## 2013-03-07 DIAGNOSIS — IMO0001 Reserved for inherently not codable concepts without codable children: Secondary | ICD-10-CM | POA: Insufficient documentation

## 2013-03-07 DIAGNOSIS — F411 Generalized anxiety disorder: Secondary | ICD-10-CM | POA: Insufficient documentation

## 2013-03-07 DIAGNOSIS — E079 Disorder of thyroid, unspecified: Secondary | ICD-10-CM | POA: Insufficient documentation

## 2013-03-07 DIAGNOSIS — Z7982 Long term (current) use of aspirin: Secondary | ICD-10-CM | POA: Insufficient documentation

## 2013-03-07 DIAGNOSIS — M129 Arthropathy, unspecified: Secondary | ICD-10-CM | POA: Insufficient documentation

## 2013-03-07 DIAGNOSIS — R5381 Other malaise: Secondary | ICD-10-CM | POA: Insufficient documentation

## 2013-03-07 DIAGNOSIS — IMO0002 Reserved for concepts with insufficient information to code with codable children: Secondary | ICD-10-CM

## 2013-03-07 DIAGNOSIS — I10 Essential (primary) hypertension: Secondary | ICD-10-CM | POA: Insufficient documentation

## 2013-03-07 DIAGNOSIS — Z8781 Personal history of (healed) traumatic fracture: Secondary | ICD-10-CM | POA: Insufficient documentation

## 2013-03-07 DIAGNOSIS — Z9181 History of falling: Secondary | ICD-10-CM | POA: Insufficient documentation

## 2013-03-07 DIAGNOSIS — F329 Major depressive disorder, single episode, unspecified: Secondary | ICD-10-CM

## 2013-03-07 DIAGNOSIS — R5383 Other fatigue: Secondary | ICD-10-CM

## 2013-03-07 DIAGNOSIS — F1910 Other psychoactive substance abuse, uncomplicated: Secondary | ICD-10-CM | POA: Insufficient documentation

## 2013-03-07 DIAGNOSIS — Z79899 Other long term (current) drug therapy: Secondary | ICD-10-CM | POA: Insufficient documentation

## 2013-03-07 DIAGNOSIS — F32A Depression, unspecified: Secondary | ICD-10-CM

## 2013-03-07 LAB — CBC WITH DIFFERENTIAL/PLATELET
Basophils Absolute: 0 10*3/uL (ref 0.0–0.1)
Basophils Relative: 0 % (ref 0–1)
Eosinophils Absolute: 0.1 10*3/uL (ref 0.0–0.7)
Eosinophils Relative: 1 % (ref 0–5)
HCT: 40.9 % (ref 36.0–46.0)
Hemoglobin: 14.5 g/dL (ref 12.0–15.0)
Lymphocytes Relative: 25 % (ref 12–46)
Lymphs Abs: 2.7 10*3/uL (ref 0.7–4.0)
MCH: 30.5 pg (ref 26.0–34.0)
MCHC: 35.5 g/dL (ref 30.0–36.0)
MCV: 85.9 fL (ref 78.0–100.0)
Monocytes Absolute: 1 10*3/uL (ref 0.1–1.0)
Monocytes Relative: 10 % (ref 3–12)
Neutro Abs: 6.9 10*3/uL (ref 1.7–7.7)
Neutrophils Relative %: 64 % (ref 43–77)
Platelets: 330 10*3/uL (ref 150–400)
RBC: 4.76 MIL/uL (ref 3.87–5.11)
RDW: 13.5 % (ref 11.5–15.5)
WBC: 10.7 10*3/uL — ABNORMAL HIGH (ref 4.0–10.5)

## 2013-03-07 LAB — RAPID URINE DRUG SCREEN, HOSP PERFORMED
Amphetamines: NOT DETECTED
Barbiturates: NOT DETECTED
Benzodiazepines: NOT DETECTED
Cocaine: NOT DETECTED
Opiates: NOT DETECTED
Tetrahydrocannabinol: NOT DETECTED

## 2013-03-07 LAB — COMPREHENSIVE METABOLIC PANEL
ALT: 21 U/L (ref 0–35)
AST: 26 U/L (ref 0–37)
Albumin: 4.1 g/dL (ref 3.5–5.2)
Alkaline Phosphatase: 174 U/L — ABNORMAL HIGH (ref 39–117)
BUN: 16 mg/dL (ref 6–23)
CO2: 30 mEq/L (ref 19–32)
Calcium: 9.7 mg/dL (ref 8.4–10.5)
Chloride: 91 mEq/L — ABNORMAL LOW (ref 96–112)
Creatinine, Ser: 0.95 mg/dL (ref 0.50–1.10)
GFR calc Af Amer: 66 mL/min — ABNORMAL LOW (ref >90)
GFR calc non Af Amer: 57 mL/min — ABNORMAL LOW (ref >90)
Glucose, Bld: 101 mg/dL — ABNORMAL HIGH (ref 70–99)
Potassium: 3.3 mEq/L — ABNORMAL LOW (ref 3.7–5.3)
Sodium: 136 mEq/L — ABNORMAL LOW (ref 137–147)
Total Bilirubin: 0.5 mg/dL (ref 0.3–1.2)
Total Protein: 7.6 g/dL (ref 6.0–8.3)

## 2013-03-07 LAB — ACETAMINOPHEN LEVEL: Acetaminophen (Tylenol), Serum: <15 ug/mL (ref 10–30)

## 2013-03-07 LAB — URINALYSIS, ROUTINE W REFLEX MICROSCOPIC
Bilirubin Urine: NEGATIVE
Glucose, UA: NEGATIVE mg/dL
Hgb urine dipstick: NEGATIVE
Ketones, ur: NEGATIVE mg/dL
Leukocytes, UA: NEGATIVE
Nitrite: NEGATIVE
Protein, ur: NEGATIVE mg/dL
Specific Gravity, Urine: 1.013 (ref 1.005–1.030)
Urobilinogen, UA: 0.2 mg/dL (ref 0.0–1.0)
pH: 7.5 (ref 5.0–8.0)

## 2013-03-07 LAB — ETHANOL: Alcohol, Ethyl (B): <11 mg/dL (ref 0–11)

## 2013-03-07 MED ORDER — CALCIUM CARBONATE-VITAMIN D 500-200 MG-UNIT PO TABS
1.0000 | ORAL_TABLET | Freq: Every day | ORAL | Status: DC
  Administered 2013-03-08 – 2013-03-10 (×3): 1 via ORAL
  Filled 2013-03-07 (×4): qty 1

## 2013-03-07 MED ORDER — FAMOTIDINE 20 MG PO TABS
10.0000 mg | ORAL_TABLET | Freq: Every day | ORAL | Status: DC
  Administered 2013-03-07 – 2013-03-10 (×4): 10 mg via ORAL
  Filled 2013-03-07 (×4): qty 1

## 2013-03-07 MED ORDER — TRAZODONE HCL 50 MG PO TABS
50.0000 mg | ORAL_TABLET | Freq: Every day | ORAL | Status: DC
  Administered 2013-03-07: 50 mg via ORAL
  Administered 2013-03-08: 100 mg via ORAL
  Administered 2013-03-09: 50 mg via ORAL
  Filled 2013-03-07: qty 1
  Filled 2013-03-07: qty 2
  Filled 2013-03-07: qty 1

## 2013-03-07 MED ORDER — MECLIZINE HCL 25 MG PO TABS: 25.0000 mg | ORAL_TABLET | Freq: Three times a day (TID) | ORAL | Status: DC | PRN

## 2013-03-07 MED ORDER — POTASSIUM CHLORIDE CRYS ER 20 MEQ PO TBCR
40.0000 meq | EXTENDED_RELEASE_TABLET | Freq: Once | ORAL | Status: AC
  Administered 2013-03-07: 40 meq via ORAL
  Filled 2013-03-07: qty 2

## 2013-03-07 MED ORDER — TRIAMTERENE-HCTZ 75-50 MG PO TABS
1.0000 | ORAL_TABLET | Freq: Every day | ORAL | Status: DC
  Administered 2013-03-08 – 2013-03-10 (×3): 1 via ORAL
  Filled 2013-03-07 (×4): qty 1

## 2013-03-07 MED ORDER — CALCIUM CARB-CHOLECALCIFEROL 600-800 MG-UNIT PO TABS: 1.0000 | ORAL_TABLET | Freq: Every day | ORAL | Status: DC

## 2013-03-07 MED ORDER — BUSPIRONE HCL 10 MG PO TABS
10.0000 mg | ORAL_TABLET | Freq: Two times a day (BID) | ORAL | Status: DC
  Administered 2013-03-07 – 2013-03-10 (×6): 10 mg via ORAL
  Filled 2013-03-07 (×6): qty 1

## 2013-03-07 MED ORDER — LORATADINE 10 MG PO TABS
10.0000 mg | ORAL_TABLET | Freq: Every day | ORAL | Status: DC
  Administered 2013-03-08 – 2013-03-10 (×3): 10 mg via ORAL
  Filled 2013-03-07 (×4): qty 1

## 2013-03-07 MED ORDER — VITAMIN D (ERGOCALCIFEROL) 1.25 MG (50000 UNIT) PO CAPS
50000.0000 [IU] | ORAL_CAPSULE | ORAL | Status: DC
  Administered 2013-03-08: 50000 [IU] via ORAL
  Filled 2013-03-07: qty 1

## 2013-03-07 MED ORDER — ASPIRIN 81 MG PO TABS: 81.0000 mg | ORAL_TABLET | Freq: Every day | ORAL | Status: DC

## 2013-03-07 MED ORDER — VENLAFAXINE HCL 50 MG PO TABS
100.0000 mg | ORAL_TABLET | Freq: Three times a day (TID) | ORAL | Status: DC
  Administered 2013-03-07 – 2013-03-10 (×8): 100 mg via ORAL
  Filled 2013-03-07 (×12): qty 2

## 2013-03-07 MED ORDER — ACETAMINOPHEN 500 MG PO TABS
500.0000 mg | ORAL_TABLET | Freq: Four times a day (QID) | ORAL | Status: DC | PRN
  Administered 2013-03-07 – 2013-03-10 (×9): 500 mg via ORAL
  Filled 2013-03-07 (×10): qty 1

## 2013-03-07 MED ORDER — LEVOTHYROXINE SODIUM 112 MCG PO TABS
112.0000 ug | ORAL_TABLET | Freq: Every day | ORAL | Status: DC
  Administered 2013-03-08 – 2013-03-10 (×3): 112 ug via ORAL
  Filled 2013-03-07 (×4): qty 1

## 2013-03-07 MED ORDER — OMEGA-3-ACID ETHYL ESTERS 1 G PO CAPS
1.0000 g | ORAL_CAPSULE | Freq: Every day | ORAL | Status: DC
  Administered 2013-03-08 – 2013-03-10 (×3): 1 g via ORAL
  Filled 2013-03-07 (×4): qty 1

## 2013-03-07 MED ORDER — ASPIRIN 81 MG PO CHEW
81.0000 mg | CHEWABLE_TABLET | Freq: Every day | ORAL | Status: DC
  Administered 2013-03-08 – 2013-03-10 (×3): 81 mg via ORAL
  Filled 2013-03-07 (×3): qty 1

## 2013-03-07 MED ORDER — GABAPENTIN 300 MG PO CAPS
300.0000 mg | ORAL_CAPSULE | Freq: Three times a day (TID) | ORAL | Status: DC
  Administered 2013-03-07 – 2013-03-10 (×8): 300 mg via ORAL
  Filled 2013-03-07 (×14): qty 1

## 2013-03-07 NOTE — BH Assessment (Signed)
Assessment Note    Patient is a 79 year old white female that requests detox from Opiate/Xanax.  Patient denies SI/HI.  Patient denies psychosis.    Patient reports that she has been abusing pain medication and anxiety medication for over 30 years.   Patient reports that she cannot remember how many Xanax and prescription medication that she takes daily.  Patient report that she knows that she has a problem but she needs help.  Patient denies previous substance abuse detox or treatment.  Patient denies alcohol abuse.  Patient BAL is <11.  Patient UDS is negative.  Patient denies any withdrawal symptoms.   Patient reports a past history of anxiety and depression.  Patient reports a previous psychiatric hospitalizations 24 years ago. Patient denies outpatient therapy.  Patient reports medication management with the primary care physician.  Patient reports feelings of hopelessness an anxiety.    Patient reports hearing the voices of deceased relatives talking to her for the past three weeks when she was abusing her prescription medication.       Axis I: Opiate Dependence, Anxiety Disorder and Major Depressive Disorder   Axis II: Deferred Axis III:  Past Medical History  Diagnosis Date  . Falls frequently   . Arthritis   . Hypertension   . Fracture, clavicle   . Thyroid disease   . History of fractured vertebra   . Wrist fracture, right    Axis IV: other psychosocial or environmental problems, problems related to social environment, problems with access to health care services and problems with primary support group Axis V: 31-40 impairment in reality testing  Past Medical History:  Past Medical History  Diagnosis Date  . Falls frequently   . Arthritis   . Hypertension   . Fracture, clavicle   . Thyroid disease   . History of fractured vertebra   . Wrist fracture, right     Past Surgical History  Procedure Laterality Date  . Thyroidectomy    . Abdominal hysterectomy    .  Back surgery    . Cholecystectomy    . Wrist surgery    . Fracture surgery      Family History: No family history on file.  Social History:  reports that she has never smoked. She has never used smokeless tobacco. She reports that she does not drink alcohol or use illicit drugs.  Additional Social History:     CIWA: CIWA-Ar BP: 120/42 mmHg Pulse Rate: 94 COWS:    Allergies:  Allergies  Allergen Reactions  . Codeine     Pass out, fall  . Cortisone     Pass out, fall   . Other     All narcotics cause patient to Pass out, fall     Home Medications:  (Not in a hospital admission)  OB/GYN Status:  No LMP recorded. Patient has had a hysterectomy.  General Assessment Data Location of Assessment: [facility] ED Is this a Tele or Face-to-Face Assessment?: Face-to-Face Is this an Initial Assessment or a Re-assessment for this encounter?: Initial Assessment Living Arrangements: Alone Can pt return to current living arrangement?: Yes Admission Status: Voluntary Is patient capable of signing voluntary admission?: Yes Transfer from: [facility] Referral Source: Self/Family/Friend  Medical Screening Exam ([place]) Medical Exam completed: Yes  [place] Living Arrangements: Alone Name of Psychiatrist: None Reported Name of Therapist: None Reported  Education Status Is patient currently in school?: No Current Grade: NA Highest grade of school patient has completed:  NA Name of school: NA Contact person: NA  Risk to self Suicidal Ideation: No Suicidal Intent: No Is patient at risk for suicide?: No Suicidal Plan?: No Access to Means: No What has been your use of drugs/alcohol within the last 12 months?: Pills - Pain Pills  Previous Attempts/Gestures: Yes How many times?: 1 Other Self Harm Risks: None  Triggers for Past Attempts: Unpredictable Intentional Self Injurious Behavior: None Family Suicide History: No Recent stressful life event(s):  Conflict (Comment) Persecutory voices/beliefs?: Yes Depression: Yes Depression Symptoms: Isolating;Fatigue;Guilt;Loss of interest in usual pleasures;Feeling worthless/self pity Substance abuse history and/or treatment for substance abuse?: Yes Suicide prevention information given to non-admitted patients: Not applicable  Risk to Others Homicidal Ideation: No Thoughts of Harm to Others: No Current Homicidal Intent: No Current Homicidal Plan: No Access to Homicidal Means: No Identified Victim: N/A History of harm to others?: No Assessment of Violence: None Noted Violent Behavior Description: Calm Does patient have access to weapons?: No Criminal Charges Pending?: No Does patient have a court date: No  Psychosis Hallucinations: None noted Delusions: None noted  Mental Status Report Appear/Hygiene: Disheveled Eye Contact: Fair Motor Activity: Freedom of movement Speech: Logical/coherent Level of Consciousness: Alert;Quiet/awake Mood: Depressed;Anxious Affect: Appropriate to circumstance Anxiety Level: Minimal Thought Processes: Coherent;Relevant Judgement: Unimpaired Orientation: Person;Place;Time;Situation Obsessive Compulsive Thoughts/Behaviors: None  Cognitive Functioning Concentration: Normal Memory: Recent Intact;Remote Intact IQ: Above Average Insight: Fair Impulse Control: Fair Appetite: Fair Weight Loss: 0 Weight Gain: 0 Sleep: No Change Total Hours of Sleep: 7 Vegetative Symptoms: None  ADLScreening [facility] Assessment Services) Patient's cognitive ability adequate to safely complete daily activities?: Yes Patient able to express need for assistance with ADLs?: Yes Independently performs ADLs?: Yes (appropriate for developmental age)  Prior Inpatient Therapy Prior Inpatient Therapy: Yes Prior Therapy Dates: 35 years ago  Prior Therapy Facilty/Provider(s): [facility]  Reason for Treatment: Overdose   Prior Outpatient Therapy Prior Outpatient  Therapy: No Prior Therapy Dates: NA Prior Therapy Facilty/Provider(s): NA Reason for Treatment: NA  ADL Screening (condition at time of admission) Patient's cognitive ability adequate to safely complete daily activities?: Yes Patient able to express need for assistance with ADLs?: Yes Independently performs ADLs?: Yes (appropriate for developmental age)         Values / Beliefs Cultural Requests During Hospitalization: None Spiritual Requests During Hospitalization: None        Additional Information 1:1 In Past 12 Months?: No CIRT Risk: No Elopement Risk: No Does patient have medical clearance?: Yes     Disposition: Pending psych disposition.  Initial Assessment Completed for this Encounter: Yes  Disposition of Patient: Pending psych evaluation in the morning.    On Site Evaluation by:   Reviewed with Physician:    Graciella Freer [place] 03/07/2013 10:30 PM

## 2013-03-07 NOTE — BH Assessment (Signed)
[place] Assessment Progress Note  At 20:55 I spoke to Dr Wilson Singer in anticipation of TTS consult.  Per Dr. Wilson Singer: Hx of opiate/Xanax abuse; can't get refills; just started on Buspar. Denies SI, HI, psychosis. May need detox; wants help w/ anxiety problems.   Jalene Mullet, MA Triage Specialist 03/07/2013 @ 21:01

## 2013-03-07 NOTE — ED Notes (Signed)
Per pt/family-has been on Xanax for 10 years or more-found out insurance will no longer pay-PCP sent here for detox-states she has not taking Xanax in a week-also abuses phenergan-was addicted to vicodin in past

## 2013-03-07 NOTE — Progress Notes (Signed)
   CARE MANAGEMENT ED NOTE 03/07/2013  Patient:  Erika Conley, Erika Conley   Account Number:  [id number]  Date Initiated:  03/07/2013  Documentation initiated by:  Livia Snellen  Subjective/Objective Assessment:   Patient presents to Ed because has not taken xanax in a week     Subjective/Objective Assessment Detail:   Patient with pmhx of thyroid disease and htn.     Action/Plan:   Action/Plan Detail:   Anticipated DC Date:       Status Recommendation to Physician:   Result of Recommendation:    Other ED [place]  Other  PCP issues    Choice offered to / List presented to:            Status of service:  Completed, signed off  ED Comments:   ED Comments Detail:  EDCM spoke to patient and her family, daughter  Erika Conley and son Erika Conley at bedside.  Patient reports she lives at home by herself.  Patient's daughter reports they live close and patient's daughter works across the street.  Patient has a walker, cane , commode, shower chair, and safety rails in the bathroom.  Patient is also currently receiving home health services from [facility], PT, OT. Patient repports the best phone number to call for an emergency is her son Erika Conley at [phone].  No further EDCM needs at this time.

## 2013-03-07 NOTE — ED Provider Notes (Signed)
CSN: [id number]     Arrival date & time 03/07/13  1424 History   First MD Initiated Contact with Patient 03/07/13 1541     Chief Complaint  Patient presents with  . Medical Clearance   (Consider location/radiation/quality/duration/timing/severity/associated sxs/prior Treatment) HPI  79 year old female with polysubstance abuse. Long standing history of inappropriately taking numerous prescription drugs including Xanax, Phenergan and Vicodin. Recently stopped taking Xanax. Prescribed BuSpar by her PCP. She feels that this is not working, although she has only taken for a few days. She feel shaky and overwhelmed. Her family is with her for support. Denies SI or HI. NO hallucinations.   Past Medical History  Diagnosis Date  . Falls frequently   . Arthritis   . Hypertension   . Fracture, clavicle   . Thyroid disease   . History of fractured vertebra   . Wrist fracture, right    Past Surgical History  Procedure Laterality Date  . Thyroidectomy    . Abdominal hysterectomy    . Back surgery    . Cholecystectomy    . Wrist surgery    . Fracture surgery     No family history on file. History  Substance Use Topics  . Smoking status: Never Smoker   . Smokeless tobacco: Never Used  . Alcohol Use: No   OB History   Grav Para Term Preterm Abortions TAB SAB Ect Mult Living                 Review of Systems  All systems reviewed and negative, other than as noted in HPI.   Allergies  Codeine; Cortisone; and Other  Home Medications   Current Outpatient Rx  Name  Route  Sig  Dispense  Refill  . acetaminophen (TYLENOL) 500 MG tablet   Oral   Take 500 mg by mouth every 6 (six) hours as needed for mild pain or moderate pain.         Marland Kitchen aspirin 81 MG tablet   Oral   Take 81 mg by mouth daily.         . BusPIRone HCl (BUSPAR PO)   Oral   Take 10 mg by mouth 2 (two) times daily.         . Calcium Carb-Cholecalciferol 600-800 MG-UNIT TABS   Oral   Take 1 tablet by mouth  daily.         . fexofenadine (ALLEGRA) 180 MG tablet   Oral   Take 180 mg by mouth daily.         Marland Kitchen gabapentin (NEURONTIN) 300 MG capsule   Oral   Take 300 mg by mouth 3 (three) times daily.         Marland Kitchen ibuprofen (ADVIL,MOTRIN) 200 MG tablet   Oral   Take 200-400 mg by mouth every 6 (six) hours as needed.         Marland Kitchen levothyroxine (SYNTHROID, LEVOTHROID) 112 MCG tablet   Oral   Take 112 mcg by mouth daily before breakfast.         . meclizine (ANTIVERT) 25 MG tablet   Oral   Take 25 mg by mouth 3 (three) times daily as needed for dizziness.         . Multiple Vitamins-Minerals (CENTRUM SILVER ADULT 50+ PO)   Oral   Take 1 tablet by mouth daily.         . Omega-3 Fatty Acids (FISH OIL PO)   Oral   Take 1 tablet by mouth daily.         Marland Kitchen  pantoprazole (PROTONIX) 40 MG tablet   Oral   Take 40 mg by mouth daily.         . promethazine (PHENERGAN) 25 MG tablet   Oral   Take 25 mg by mouth every 6 (six) hours as needed for nausea or vomiting.         . ranitidine (ZANTAC) 150 MG tablet   Oral   Take 150 mg by mouth daily.         . traMADol (ULTRAM) 50 MG tablet   Oral   Take 1 tablet (50 mg total) by mouth every 6 (six) hours as needed.   20 tablet   0   . traZODone (DESYREL) 150 MG tablet   Oral   Take 50-150 mg by mouth at bedtime.         . triamterene-hydrochlorothiazide (MAXZIDE) 75-50 MG per tablet   Oral   Take 1 tablet by mouth daily.         Marland Kitchen venlafaxine (EFFEXOR) 100 MG tablet   Oral   Take 100 mg by mouth 3 (three) times daily.         . Vitamin D, Ergocalciferol, (DRISDOL) 50000 UNITS CAPS capsule   Oral   Take 50,000 Units by mouth every 14 (fourteen) days.          BP 147/114  Pulse 77  Resp 18  SpO2 95% Physical Exam  Nursing note and vitals reviewed. Constitutional: She appears well-developed and well-nourished. No distress.  HENT:  Head: Normocephalic and atraumatic.  Eyes: Conjunctivae are normal. Right  eye exhibits no discharge. Left eye exhibits no discharge.  Neck: Neck supple.  Cardiovascular: Normal rate, regular rhythm and normal heart sounds.  Exam reveals no gallop and no friction rub.   No murmur heard. Pulmonary/Chest: Effort normal and breath sounds normal. No respiratory distress.  Abdominal: Soft. She exhibits no distension. There is no tenderness.  Musculoskeletal: She exhibits no edema and no tenderness.  LUE in sling  Neurological: She is alert.  Skin: Skin is warm and dry.  Psychiatric: She has a normal mood and affect. Her behavior is normal. Thought content normal.    ED Course  Procedures (including critical care time) Labs Review Labs Reviewed  COMPREHENSIVE METABOLIC PANEL - Abnormal; Notable for the following:    Sodium 136 (*)    Potassium 3.3 (*)    Chloride 91 (*)    Glucose, Bld 101 (*)    Alkaline Phosphatase 174 (*)    GFR calc non Af Amer 57 (*)    GFR calc Af Amer 66 (*)    All other components within normal limits  CBC WITH DIFFERENTIAL - Abnormal; Notable for the following:    WBC 10.7 (*)    All other components within normal limits  URINE RAPID DRUG SCREEN (HOSP PERFORMED)  ACETAMINOPHEN LEVEL  ETHANOL  URINALYSIS, ROUTINE W REFLEX MICROSCOPIC   Imaging Review No results found.  EKG Interpretation   None       MDM   1. Prescription drug abuse      78yF with hx of polysubstance abuse. Impacting relationships and contributing to falls and general well being. Would like help with this. Recently started on Buspar but feels like not working but has only been on for a few days. She is not psychotic. No SI or HI. Seems overwhelmed though. Will have speak with psych for potential meds recommendations or possibly placement if deemed appropriate.     Virgel Manifold, MD  03/10/13 2227 

## 2013-03-08 DIAGNOSIS — F411 Generalized anxiety disorder: Secondary | ICD-10-CM

## 2013-03-08 DIAGNOSIS — F332 Major depressive disorder, recurrent severe without psychotic features: Secondary | ICD-10-CM

## 2013-03-08 DIAGNOSIS — F1320 Sedative, hypnotic or anxiolytic dependence, uncomplicated: Secondary | ICD-10-CM

## 2013-03-08 DIAGNOSIS — F1120 Opioid dependence, uncomplicated: Secondary | ICD-10-CM

## 2013-03-08 MED ORDER — ONDANSETRON 4 MG PO TBDP
4.0000 mg | ORAL_TABLET | Freq: Four times a day (QID) | ORAL | Status: DC | PRN
  Administered 2013-03-08 – 2013-03-10 (×3): 4 mg via ORAL
  Filled 2013-03-08 (×4): qty 1

## 2013-03-08 MED ORDER — VITAMIN B-1 100 MG PO TABS
100.0000 mg | ORAL_TABLET | Freq: Every day | ORAL | Status: DC
  Administered 2013-03-09 – 2013-03-10 (×2): 100 mg via ORAL
  Filled 2013-03-08 (×2): qty 1

## 2013-03-08 MED ORDER — ADULT MULTIVITAMIN W/MINERALS CH
1.0000 | ORAL_TABLET | Freq: Every day | ORAL | Status: DC
  Administered 2013-03-09 – 2013-03-10 (×2): 1 via ORAL
  Filled 2013-03-08 (×2): qty 1

## 2013-03-08 MED ORDER — CHLORDIAZEPOXIDE HCL 25 MG PO CAPS
25.0000 mg | ORAL_CAPSULE | Freq: Four times a day (QID) | ORAL | Status: DC | PRN
Start: 2013-03-08 — End: 2013-03-10

## 2013-03-08 MED ORDER — HYDROXYZINE HCL 25 MG PO TABS: 25.0000 mg | ORAL_TABLET | Freq: Four times a day (QID) | ORAL | Status: DC | PRN

## 2013-03-08 MED ORDER — LOPERAMIDE HCL 2 MG PO CAPS: 2.0000 mg | ORAL_CAPSULE | ORAL | Status: DC | PRN

## 2013-03-08 MED ORDER — THIAMINE HCL 100 MG/ML IJ SOLN: 100.0000 mg | Freq: Once | INTRAMUSCULAR | Status: DC

## 2013-03-08 NOTE — Consult Note (Signed)
[facility] Consult   Reason for Consult:  Opioid Dependence, Major depressive d/o, Benzodiazepine dependence Referring Physician:   EDP Erika Conley is an 79 y.o. female.  Assessment: AXIS I:  Anxiety Disorder NOS, Major Depression, Recurrent severe and Opoid Dependence, Benzodiazepine dependence AXIS II:  Deferred AXIS III:   Past Medical History  Diagnosis Date  . Falls frequently   . Arthritis   . Hypertension   . Fracture, clavicle   . Thyroid disease   . History of fractured vertebra   . Wrist fracture, right    AXIS IV:  other psychosocial or environmental problems and problems related to social environment AXIS V:  41-50 serious symptoms  Plan:  Recommend psychiatric Inpatient admission when medically cleared.  Subjective:   Erika Conley is a 79 y.o. female patient admitted with Opioid dependence, Benzodiazepine Dependence, Anxiety d/o, Major Depressive d/o.  HPI:  Patient is a 79 year old white female that requests detox from Opiate/Xanax. Patient denies SI/HI. Patient denies psychosis at the time of the interview. Patient reports that she has been abusing pain medication and anxiety medication for over 30 years. Patient reports that she cannot remember how many Xanax and prescription medication that she takes daily. Patient report that she knows that she has a problem but she needs help. Patient denies previous substance abuse detox or treatment. Patient denies alcohol abuse. Patient reports a past history of anxiety and depression. Patient reports a previous psychiatric hospitalizations 24 years ago. Patient does not have any psychiatrist or therapist and her Primary care Physician manages her medications.  Patient states he family members are not happy with her for abusing these narcotic medications. She reports her last day of taking Xanax was on Monday.  Patient has a splint to her left arm from a fall where she sustained fracture of her left shoulder.   Patient reports poor sleep and appetite.  She also reports withdrawal seizures from Benzodiazepine  and frequent falls.  Patient denies SI/HI/AVH but she states she hears the voices of dead relatives when she is under the influence of substances.  We will admit patient for Detox in our inpatient Psychiatric unit.  We will also seek placement at other facilities with available bed for admission.  We have resumed her home medications and we have started patient on Librium protocol for Benzodiazepine  Detox.  HPI Elements:   Location:  [facility]. Quality:  SEVERE, FALLING DOWN AT HOME, FAMILY DISCORD DUE TO ABUSING PRESCRIPTION MEDS. Context:  Benzodiazepine addiction, withdrawal and falls..  Past Psychiatric History: Past Medical History  Diagnosis Date  . Falls frequently   . Arthritis   . Hypertension   . Fracture, clavicle   . Thyroid disease   . History of fractured vertebra   . Wrist fracture, right     reports that she has never smoked. She has never used smokeless tobacco. She reports that she does not drink alcohol or use illicit drugs. No family history on file. Family History Substance Abuse: Yes, Describe: Family Supports: Yes, List: (Son and daughter) Living Arrangements: Alone Can pt return to current living arrangement?: Yes   Allergies:   Allergies  Allergen Reactions  . Codeine     Pass out, fall  . Cortisone     Pass out, fall   . Other     All narcotics cause patient to Pass out, fall     ACT Assessment Complete:  Yes:    Educational Status  Risk to Self: Risk to self Suicidal Ideation: No Suicidal Intent: No Is patient at risk for suicide?: No Suicidal Plan?: No Access to Means: No What has been your use of drugs/alcohol within the last 12 months?: Pills - Pain Pills  Previous Attempts/Gestures: Yes How many times?: 1 Other Self Harm Risks: None  Triggers for Past Attempts: Unpredictable Intentional Self Injurious Behavior: None Family Suicide  History: No Recent stressful life event(s): Conflict (Comment) Persecutory voices/beliefs?: Yes Depression: Yes Depression Symptoms: Isolating;Fatigue;Guilt;Loss of interest in usual pleasures;Feeling worthless/self pity Substance abuse history and/or treatment for substance abuse?: Yes Suicide prevention information given to non-admitted patients: Not applicable  Risk to Others: Risk to Others Homicidal Ideation: No Thoughts of Harm to Others: No Current Homicidal Intent: No Current Homicidal Plan: No Access to Homicidal Means: No Identified Victim: N/A History of harm to others?: No Assessment of Violence: None Noted Violent Behavior Description: Calm Does patient have access to weapons?: No Criminal Charges Pending?: No Does patient have a court date: No  Abuse:    Prior Inpatient Therapy: Prior Inpatient Therapy Prior Inpatient Therapy: Yes Prior Therapy Dates: 35 years ago  Prior Therapy Facilty/Provider(s): [facility]  Reason for Treatment: Overdose   Prior Outpatient Therapy: Prior Outpatient Therapy Prior Outpatient Therapy: No Prior Therapy Dates: NA Prior Therapy Facilty/Provider(s): NA Reason for Treatment: NA  Additional Information: Additional Information 1:1 In Past 12 Months?: No CIRT Risk: No Elopement Risk: No Does patient have medical clearance?: Yes                  Objective: Blood pressure 161/75, pulse 54, temperature 97.3 F (36.3 C), temperature source Oral, resp. rate 20, SpO2 100.00%.There is no weight on file to calculate BMI. Results for orders placed during the hospital encounter of 03/07/13 (from the past 72 hour(s))  URINE RAPID DRUG SCREEN (HOSP PERFORMED)     Status: None   Collection Time    03/07/13  3:00 PM      Result Value Range   Opiates NONE DETECTED  NONE DETECTED   Cocaine NONE DETECTED  NONE DETECTED   Benzodiazepines NONE DETECTED  NONE DETECTED   Amphetamines NONE DETECTED  NONE DETECTED    Tetrahydrocannabinol NONE DETECTED  NONE DETECTED   Barbiturates NONE DETECTED  NONE DETECTED   Comment:            DRUG SCREEN FOR MEDICAL PURPOSES     ONLY.  IF CONFIRMATION IS NEEDED     FOR ANY PURPOSE, NOTIFY LAB     WITHIN 5 DAYS.                LOWEST DETECTABLE LIMITS     FOR URINE DRUG SCREEN     Drug Class       Cutoff (ng/mL)     Amphetamine      1000     Barbiturate      200     Benzodiazepine   035     Tricyclics       009     Opiates          300     Cocaine          300     THC              50  URINALYSIS, ROUTINE W REFLEX MICROSCOPIC     Status: None   Collection Time    03/07/13  3:00 PM      Result Value  Range   Color, Urine YELLOW  YELLOW   APPearance CLEAR  CLEAR   Specific Gravity, Urine 1.013  1.005 - 1.030   pH 7.5  5.0 - 8.0   Glucose, UA NEGATIVE  NEGATIVE mg/dL   Hgb urine dipstick NEGATIVE  NEGATIVE   Bilirubin Urine NEGATIVE  NEGATIVE   Ketones, ur NEGATIVE  NEGATIVE mg/dL   Protein, ur NEGATIVE  NEGATIVE mg/dL   Urobilinogen, UA 0.2  0.0 - 1.0 mg/dL   Nitrite NEGATIVE  NEGATIVE   Leukocytes, UA NEGATIVE  NEGATIVE   Comment: MICROSCOPIC NOT DONE ON URINES WITH NEGATIVE PROTEIN, BLOOD, LEUKOCYTES, NITRITE, OR GLUCOSE <1000 mg/dL.  COMPREHENSIVE METABOLIC PANEL     Status: Abnormal   Collection Time    03/07/13  4:39 PM      Result Value Range   Sodium 136 (*) 137 - 147 mEq/L   Potassium 3.3 (*) 3.7 - 5.3 mEq/L   Chloride 91 (*) 96 - 112 mEq/L   CO2 30  19 - 32 mEq/L   Glucose, Bld 101 (*) 70 - 99 mg/dL   BUN 16  6 - 23 mg/dL   Creatinine, Ser 0.95  0.50 - 1.10 mg/dL   Calcium 9.7  8.4 - 10.5 mg/dL   Total Protein 7.6  6.0 - 8.3 g/dL   Albumin 4.1  3.5 - 5.2 g/dL   AST 26  0 - 37 U/L   ALT 21  0 - 35 U/L   Alkaline Phosphatase 174 (*) 39 - 117 U/L   Total Bilirubin 0.5  0.3 - 1.2 mg/dL   GFR calc non Af Amer 57 (*) >90 mL/min   GFR calc Af Amer 66 (*) >90 mL/min   Comment: (NOTE)     The eGFR has been calculated using the CKD EPI  equation.     This calculation has not been validated in all clinical situations.     eGFR's persistently <90 mL/min signify possible Chronic Kidney     Disease.  CBC WITH DIFFERENTIAL     Status: Abnormal   Collection Time    03/07/13  4:39 PM      Result Value Range   WBC 10.7 (*) 4.0 - 10.5 K/uL   RBC 4.76  3.87 - 5.11 MIL/uL   Hemoglobin 14.5  12.0 - 15.0 g/dL   HCT 40.9  36.0 - 46.0 %   MCV 85.9  78.0 - 100.0 fL   MCH 30.5  26.0 - 34.0 pg   MCHC 35.5  30.0 - 36.0 g/dL   RDW 13.5  11.5 - 15.5 %   Platelets 330  150 - 400 K/uL   Neutrophils Relative % 64  43 - 77 %   Neutro Abs 6.9  1.7 - 7.7 K/uL   Lymphocytes Relative 25  12 - 46 %   Lymphs Abs 2.7  0.7 - 4.0 K/uL   Monocytes Relative 10  3 - 12 %   Monocytes Absolute 1.0  0.1 - 1.0 K/uL   Eosinophils Relative 1  0 - 5 %   Eosinophils Absolute 0.1  0.0 - 0.7 K/uL   Basophils Relative 0  0 - 1 %   Basophils Absolute 0.0  0.0 - 0.1 K/uL  ACETAMINOPHEN LEVEL     Status: None   Collection Time    03/07/13  4:39 PM      Result Value Range   Acetaminophen (Tylenol), Serum <15.0  10 - 30 ug/mL   Comment:  THERAPEUTIC CONCENTRATIONS VARY     SIGNIFICANTLY. A RANGE OF 10-30     ug/mL MAY BE AN EFFECTIVE     CONCENTRATION FOR MANY PATIENTS.     HOWEVER, SOME ARE BEST TREATED     AT CONCENTRATIONS OUTSIDE THIS     RANGE.     ACETAMINOPHEN CONCENTRATIONS     >150 ug/mL AT 4 HOURS AFTER     INGESTION AND >50 ug/mL AT 12     HOURS AFTER INGESTION ARE     OFTEN ASSOCIATED WITH TOXIC     REACTIONS.  ETHANOL     Status: None   Collection Time    03/07/13  4:39 PM      Result Value Range   Alcohol, Ethyl (B) <11  0 - 11 mg/dL   Comment:            LOWEST DETECTABLE LIMIT FOR     SERUM ALCOHOL IS 11 mg/dL     FOR MEDICAL PURPOSES ONLY   Labs are reviewed and are pertinent for Unremarkable lab results.  Current Facility-Administered Medications  Medication Dose Route Frequency Provider Last Rate Last Dose  .  acetaminophen (TYLENOL) tablet 500 mg  500 mg Oral Q6H PRN Virgel Manifold, MD   500 mg at 03/08/13 1002  . aspirin chewable tablet 81 mg  81 mg Oral Daily Virgel Manifold, MD   81 mg at 03/08/13 2263  . busPIRone (BUSPAR) tablet 10 mg  10 mg Oral BID Virgel Manifold, MD   10 mg at 03/08/13 0955  . calcium-vitamin D (OSCAL WITH D) 500-200 MG-UNIT per tablet 1 tablet  1 tablet Oral Q breakfast Virgel Manifold, MD   1 tablet at 03/08/13 [phone]  . chlordiazePOXIDE (LIBRIUM) capsule 25 mg  25 mg Oral Q6H PRN Delfin Gant, NP      . famotidine (PEPCID) tablet 10 mg  10 mg Oral Daily Virgel Manifold, MD   10 mg at 03/08/13 0955  . gabapentin (NEURONTIN) capsule 300 mg  300 mg Oral TID Virgel Manifold, MD   300 mg at 03/08/13 5625  . hydrOXYzine (ATARAX/VISTARIL) tablet 25 mg  25 mg Oral Q6H PRN Delfin Gant, NP      . levothyroxine (SYNTHROID, LEVOTHROID) tablet 112 mcg  112 mcg Oral QAC breakfast Virgel Manifold, MD   112 mcg at 03/08/13 [phone]  . loperamide (IMODIUM) capsule 2-4 mg  2-4 mg Oral PRN Delfin Gant, NP      . loratadine (CLARITIN) tablet 10 mg  10 mg Oral Daily Virgel Manifold, MD   10 mg at 03/08/13 0955  . meclizine (ANTIVERT) tablet 25 mg  25 mg Oral TID PRN Virgel Manifold, MD      . multivitamin with minerals tablet 1 tablet  1 tablet Oral Daily Delfin Gant, NP      . omega-3 acid ethyl esters (LOVAZA) capsule 1 g  1 g Oral Daily Virgel Manifold, MD   1 g at 03/08/13 0958  . ondansetron (ZOFRAN-ODT) disintegrating tablet 4 mg  4 mg Oral Q6H PRN Delfin Gant, NP      . thiamine (B-1) injection 100 mg  100 mg Intramuscular Once Delfin Gant, NP      . Derrill Memo ON 03/09/2013] thiamine (VITAMIN B-1) tablet 100 mg  100 mg Oral Daily Delfin Gant, NP      . traZODone (DESYREL) tablet 50-150 mg  50-150 mg Oral QHS Virgel Manifold, MD   50 mg at 03/07/13 2323  .  triamterene-hydrochlorothiazide (MAXZIDE) 75-50 MG per tablet 1 tablet  1 tablet Oral Daily Virgel Manifold, MD   1  tablet at 03/08/13 [phone]  . venlafaxine [facility]) tablet 100 mg  100 mg Oral TID Virgel Manifold, MD   100 mg at 03/08/13 0958  . Vitamin D (Ergocalciferol) (DRISDOL) capsule 50,000 Units  50,000 Units Oral Q14 Days Virgel Manifold, MD   50,000 Units at 03/08/13 5374   Current Outpatient Prescriptions  Medication Sig Dispense Refill  . acetaminophen (TYLENOL) 500 MG tablet Take 500 mg by mouth every 6 (six) hours as needed for mild pain or moderate pain.      Marland Kitchen aspirin 81 MG tablet Take 81 mg by mouth daily.      . BusPIRone HCl (BUSPAR PO) Take 10 mg by mouth 2 (two) times daily.      . Calcium Carb-Cholecalciferol 600-800 MG-UNIT TABS Take 1 tablet by mouth daily.      . fexofenadine (ALLEGRA) 180 MG tablet Take 180 mg by mouth daily.      Marland Kitchen gabapentin (NEURONTIN) 300 MG capsule Take 300 mg by mouth 3 (three) times daily.      Marland Kitchen ibuprofen (ADVIL,MOTRIN) 200 MG tablet Take 200-400 mg by mouth every 6 (six) hours as needed.      Marland Kitchen levothyroxine (SYNTHROID, LEVOTHROID) 112 MCG tablet Take 112 mcg by mouth daily before breakfast.      . meclizine (ANTIVERT) 25 MG tablet Take 25 mg by mouth 3 (three) times daily as needed for dizziness.      . Multiple Vitamins-Minerals (CENTRUM SILVER ADULT 50+ PO) Take 1 tablet by mouth daily.      . Omega-3 Fatty Acids (FISH OIL PO) Take 1 tablet by mouth daily.      . pantoprazole (PROTONIX) 40 MG tablet Take 40 mg by mouth daily.      . promethazine (PHENERGAN) 25 MG tablet Take 25 mg by mouth every 6 (six) hours as needed for nausea or vomiting.      . ranitidine (ZANTAC) 150 MG tablet Take 150 mg by mouth daily.      . traMADol (ULTRAM) 50 MG tablet Take 1 tablet (50 mg total) by mouth every 6 (six) hours as needed.  20 tablet  0  . traZODone (DESYREL) 150 MG tablet Take 50-150 mg by mouth at bedtime.      . triamterene-hydrochlorothiazide (MAXZIDE) 75-50 MG per tablet Take 1 tablet by mouth daily.      Marland Kitchen venlafaxine (EFFEXOR) 100 MG tablet Take 100 mg by mouth  3 (three) times daily.      . Vitamin D, Ergocalciferol, (DRISDOL) 50000 UNITS CAPS capsule Take 50,000 Units by mouth every 14 (fourteen) days.        Psychiatric Specialty Exam:     Blood pressure 161/75, pulse 54, temperature 97.3 F (36.3 C), temperature source Oral, resp. rate 20, SpO2 100.00%.There is no weight on file to calculate BMI.  General Appearance: Casual  Eye Contact::  Good  Speech:  Clear and Coherent and Normal Rate  Volume:  Normal  Mood:  Anxious, Depressed, Hopeless and Worthless  Affect:  Congruent, Depressed and Flat  Thought Process:  Coherent and Goal Directed  Orientation:  Full (Time, Place, and Person)  Thought Content:  NA  Suicidal Thoughts:  No  Homicidal Thoughts:  No  Memory:  Immediate;   Good Recent;   Good Remote;   Good  Judgement:  Poor  Insight:  Good  Psychomotor Activity:  Tremor  Concentration:  Good  Recall:  NA  Akathisia:  NA  Handed:  Right  AIMS (if indicated):     Assets:  Desire for Improvement  Sleep:      Treatment Plan Summary:  Consult and face to face interview with Dr Louretta Shorten We have accepted patient for admission in our inpatient Psychiatric unit We are going to seek placement at other facilities with available bed We will continue to monitor patient for safety and stability. Daily contact with patient to assess and evaluate symptoms and progress in treatment Medication management  Delfin Gant   PMHNP-BC 03/08/2013 3:41 PM  Patient was seen face-to-face for this evaluation along with physician extender and reviewed the information documented by physician extender and agree with the treatment plan.   Ridley Schewe,JANARDHAHA R. 03/08/2013 6:51 PM

## 2013-03-08 NOTE — Progress Notes (Addendum)
Pt's referral has been faxed to the following facilities with bed availability:   [facility]- per Arbie Co[facility] beds available [facility]- per [facility] beds available [facility]- per [facility] beds available [facility]- per [facility] beds available [facility]- per [facility] beds available [facility]- per [facility] beds available [facility]- per [facility] beds available    [facility] Disposition [facility]

## 2013-03-08 NOTE — ED Notes (Signed)
Complaining of being slightly nauseous. Spitting up some of her food-heartburn

## 2013-03-09 ENCOUNTER — Encounter (HOSPITAL_COMMUNITY): Payer: Self-pay | Admitting: Registered Nurse

## 2013-03-09 DIAGNOSIS — F1910 Other psychoactive substance abuse, uncomplicated: Secondary | ICD-10-CM

## 2013-03-09 NOTE — ED Notes (Signed)
Breakfast tray was given.  

## 2013-03-09 NOTE — ED Notes (Signed)
Notified to case manager that pt d/c from psych care.  Consult placed per request.
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
## 2013-03-09 NOTE — Progress Notes (Signed)
Follow-up calls were placed to the following facilities regarding inptx:  [facility]- Dr. Franchot Mimes declined pt, feels as though pt need detox  [facility]- per Barnetta Chapel pt has been declined [facility]- per Mordecai Rasmussen pt has been declined d/t SA abuse [place]- psychiatrist has referral and still reviewing [facility]- per [facility] referral has not been reviewed and may not get reviewed until later tonight or tomorrow [place]- no answer, will call back to follow up   [facility] Disposition [facility]

## 2013-03-09 NOTE — ED Notes (Signed)
A hospital bed was acquired for the pt due to her back pain.

## 2013-03-10 NOTE — Discharge Instructions (Signed)
Depression, Adult °Depression is feeling sad, low, down in the dumps, blue, gloomy, or empty. In general, there are two kinds of depression: °· Normal sadness or grief. This can happen after something upsetting. It often goes away on its own within 2 weeks. After losing a loved one (bereavement), normal sadness and grief may last longer than two weeks. It usually gets better with time. °· Clinical depression. This kind lasts longer than normal sadness or grief. It keeps you from doing the things you normally do in life. It is often hard to function at home, work, or at school. It may affect your relationships with others. Treatment is often needed. °GET HELP RIGHT AWAY IF: °· You have thoughts about hurting yourself or others. °· You lose touch with reality (psychotic symptoms). You may: °· See or hear things that are not real. °· Have untrue beliefs about your life or people around you. °· Your medicine is giving you problems. °MAKE SURE YOU: °· Understand these instructions. °· Will watch your condition. °· Will get help right away if you are not doing well or get worse. °Document Released: 03/11/2010 Document Revised: 11/01/2011 Document Reviewed: 06/08/2011 °Exit[employer]® Patient Information ©2014 [employer]. ° °

## 2013-03-10 NOTE — Progress Notes (Signed)
03/10/2013 A. Christen Bame RNCM 1332pm EDCM spoke to patient's son [name] at [phone].  As per patient's son, patient is already active with [facility]. As per patient's son patient has a Therapist, sports, PT and possibly and aide.  Patient's son reports he needs at least an hour to come and pick the patient up.  EDCM will provide a list of private duty nursing services and explained to patient's son that this may be an out of pocket expense for the patient.  Patient's son reports that they are working on getting the patient into a facility and are also working on getting Medicaid for the patient.  EDCM called [facility] transition specialist Colletta Maryland who confirms that patient is active with their services and has a visiting RN, PT, and an aide.  EDCM informed Colletta Maryland that we will be adding a Education officer, museum to help patient get into a facility.  No further CM needs at this time

## 2013-03-10 NOTE — Progress Notes (Addendum)
CSW received consult for skilled nursing placement/home health. Patient has broken arm related to fall. Pt requested snf placement. CSW consutled with CSW director who agrees that patient insurance will not cover short term rehab due to patient broken arm. Per nurse patient is able to get up with minimal assistance and go to the bathroom. Pt gave csw permission to speak with pt son who is HCPOA and POA. CSW spoke with Carney Bern who states that patient family is working on getting patient placed in a facility. Patient plans to dc home with home health services. CSW awaiting for rn cm to assist with [facility] orders.   Dorathy Kinsman, LCSW [phone]  ED CSW .03/10/2013 1045am   CSW got further information from rn CM. Patient family are working with [facility] to get patietn placed in assisted living. Pt pcp completed fl2. CSW submitted for pasar evaluation, per Tammy they will allow patient to be evaluated for level II pasarr at the facility. Pt daughter to provide transportation to ALF. Patient to get TB test placed at PCP today.   Dorathy Kinsman, LCSW [phone]  ED CSW .03/10/2013 1414pm

## 2013-03-10 NOTE — ED Notes (Signed)
Social work in to talk with patient

## 2013-03-10 NOTE — Progress Notes (Signed)
03/10/2013 Erika Conley RNCM 1403pm Erika Conley received phone call from Erika [facility] at [facility].  As per Erika Conley, they are willing to accept the patient but the patient is requiring a TB test.  Erika Conley contacted Erika Conley who spoke to Erika Conley at [facility].  As per Erika Conley, Erika Conley reports the patient needs to get a TB test done at her pcp's office.  Erika Conley contacted patient's son Erika Conley and relayed this information to him.  As per patients son Erika Conley, his sister Erika Conley will be picking up the patient and taking her to her pcp's office to have the TB test done.

## 2013-03-10 NOTE — ED Notes (Signed)
Social work called to follow upon the plan. They will call back later.

## 2013-03-10 NOTE — ED Notes (Signed)
Patient is able to ambulate with one minimal assist. Still requires moderate assist to go from a sitting to a standing position.

## 2013-04-03 ENCOUNTER — Emergency Department (HOSPITAL_COMMUNITY)
Admission: EM | Admit: 2013-04-03 | Discharge: 2013-04-03 | Disposition: A | Discharge status: Home / Self Care | Payer: Medicare Other | Attending: Emergency Medicine | Admitting: Emergency Medicine

## 2013-04-03 ENCOUNTER — Emergency Department (HOSPITAL_COMMUNITY): Payer: Medicare Other

## 2013-04-03 DIAGNOSIS — W010XXA Fall on same level from slipping, tripping and stumbling without subsequent striking against object, initial encounter: Secondary | ICD-10-CM | POA: Insufficient documentation

## 2013-04-03 DIAGNOSIS — M549 Dorsalgia, unspecified: Secondary | ICD-10-CM | POA: Insufficient documentation

## 2013-04-03 DIAGNOSIS — Y9301 Activity, walking, marching and hiking: Secondary | ICD-10-CM | POA: Insufficient documentation

## 2013-04-03 DIAGNOSIS — Y9289 Other specified places as the place of occurrence of the external cause: Secondary | ICD-10-CM | POA: Insufficient documentation

## 2013-04-03 DIAGNOSIS — I10 Essential (primary) hypertension: Secondary | ICD-10-CM | POA: Insufficient documentation

## 2013-04-03 DIAGNOSIS — S42302A Unspecified fracture of shaft of humerus, left arm, initial encounter for closed fracture: Secondary | ICD-10-CM

## 2013-04-03 DIAGNOSIS — E079 Disorder of thyroid, unspecified: Secondary | ICD-10-CM | POA: Insufficient documentation

## 2013-04-03 DIAGNOSIS — Z79899 Other long term (current) drug therapy: Secondary | ICD-10-CM | POA: Insufficient documentation

## 2013-04-03 DIAGNOSIS — Z9181 History of falling: Secondary | ICD-10-CM | POA: Insufficient documentation

## 2013-04-03 DIAGNOSIS — S0083XA Contusion of other part of head, initial encounter: Principal | ICD-10-CM

## 2013-04-03 DIAGNOSIS — W19XXXA Unspecified fall, initial encounter: Secondary | ICD-10-CM

## 2013-04-03 DIAGNOSIS — S42209A Unspecified fracture of upper end of unspecified humerus, initial encounter for closed fracture: Secondary | ICD-10-CM | POA: Insufficient documentation

## 2013-04-03 DIAGNOSIS — S1093XA Contusion of unspecified part of neck, initial encounter: Principal | ICD-10-CM

## 2013-04-03 DIAGNOSIS — W1809XA Striking against other object with subsequent fall, initial encounter: Secondary | ICD-10-CM | POA: Insufficient documentation

## 2013-04-03 DIAGNOSIS — S0093XA Contusion of unspecified part of head, initial encounter: Secondary | ICD-10-CM

## 2013-04-03 DIAGNOSIS — S0003XA Contusion of scalp, initial encounter: Secondary | ICD-10-CM | POA: Insufficient documentation

## 2013-04-03 DIAGNOSIS — R197 Diarrhea, unspecified: Secondary | ICD-10-CM | POA: Insufficient documentation

## 2013-04-03 DIAGNOSIS — M129 Arthropathy, unspecified: Secondary | ICD-10-CM | POA: Insufficient documentation

## 2013-04-03 DIAGNOSIS — G8929 Other chronic pain: Secondary | ICD-10-CM | POA: Insufficient documentation

## 2013-04-03 LAB — CBC WITH DIFFERENTIAL/PLATELET
BASOS PCT: 0 % (ref 0–1)
Basophils Absolute: 0 10*3/uL (ref 0.0–0.1)
Eosinophils Absolute: 0 10*3/uL (ref 0.0–0.7)
Eosinophils Relative: 0 % (ref 0–5)
HEMATOCRIT: 42.5 % (ref 36.0–46.0)
HEMOGLOBIN: 15.4 g/dL — AB (ref 12.0–15.0)
LYMPHS PCT: 11 % — AB (ref 12–46)
Lymphs Abs: 1.2 10*3/uL (ref 0.7–4.0)
MCH: 30.6 pg (ref 26.0–34.0)
MCHC: 36.2 g/dL — AB (ref 30.0–36.0)
MCV: 84.3 fL (ref 78.0–100.0)
MONO ABS: 0.9 10*3/uL (ref 0.1–1.0)
MONOS PCT: 8 % (ref 3–12)
Neutro Abs: 8.9 10*3/uL — ABNORMAL HIGH (ref 1.7–7.7)
Neutrophils Relative %: 81 % — ABNORMAL HIGH (ref 43–77)
Platelets: 331 10*3/uL (ref 150–400)
RBC: 5.04 MIL/uL (ref 3.87–5.11)
RDW: 14.4 % (ref 11.5–15.5)
WBC: 11 10*3/uL — ABNORMAL HIGH (ref 4.0–10.5)

## 2013-04-03 LAB — COMPREHENSIVE METABOLIC PANEL
ALT: 15 U/L (ref 0–35)
AST: 23 U/L (ref 0–37)
Albumin: 3.8 g/dL (ref 3.5–5.2)
Alkaline Phosphatase: 139 U/L — ABNORMAL HIGH (ref 39–117)
BUN: 13 mg/dL (ref 6–23)
CHLORIDE: 96 meq/L (ref 96–112)
CO2: 22 meq/L (ref 19–32)
CREATININE: 0.79 mg/dL (ref 0.50–1.10)
Calcium: 9.5 mg/dL (ref 8.4–10.5)
GFR calc Af Amer: >90 mL/min (ref >90)
GFR, EST NON AFRICAN AMERICAN: 79 mL/min — AB (ref >90)
Glucose, Bld: 84 mg/dL (ref 70–99)
Potassium: 3.6 mEq/L — ABNORMAL LOW (ref 3.7–5.3)
Sodium: 142 mEq/L (ref 137–147)
Total Bilirubin: 1 mg/dL (ref 0.3–1.2)
Total Protein: 6.8 g/dL (ref 6.0–8.3)

## 2013-04-03 LAB — URINALYSIS, ROUTINE W REFLEX MICROSCOPIC
Glucose, UA: NEGATIVE mg/dL
HGB URINE DIPSTICK: NEGATIVE
Ketones, ur: >80 mg/dL — AB
Nitrite: NEGATIVE
PH: 6 (ref 5.0–8.0)
Protein, ur: 30 mg/dL — AB
SPECIFIC GRAVITY, URINE: 1.023 (ref 1.005–1.030)
Urobilinogen, UA: 1 mg/dL (ref 0.0–1.0)

## 2013-04-03 LAB — URINE MICROSCOPIC-ADD ON

## 2013-04-03 NOTE — ED Notes (Signed)
Patient not currently in room
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
## 2013-04-03 NOTE — ED Provider Notes (Signed)
CSN: [id number]     Arrival date & time 04/03/13  1051 History   First MD Initiated Contact with Patient 04/03/13 1052     Chief Complaint  Patient presents with  . Fall     (Consider location/radiation/quality/duration/timing/severity/associated sxs/prior Treatment) HPI Erika Conley is a 79 y.o. female who presents to ED with complaint of fall. Pt states she was being walked from the bathroom with her walker, states she tripped and fell backwards. Reports hitting back of the head on the ground with possible LOC. States has hx of frequent falls, dizziness, polysubstance dependance. Pt denies any definite pre fall dizziness or cp. States has some pain to the back of the head and left side of the neck. No other injuries. PT does complaint of pain in left arm which she sustained a fracture in in December. No acute injuries to the arm. Also complaining of chronic back pain. No new LE weakness or numbness. No other complaints.   Past Medical History  Diagnosis Date  . Falls frequently   . Arthritis   . Hypertension   . Fracture, clavicle   . Thyroid disease   . History of fractured vertebra   . Wrist fracture, right    Past Surgical History  Procedure Laterality Date  . Thyroidectomy    . Abdominal hysterectomy    . Back surgery    . Cholecystectomy    . Wrist surgery    . Fracture surgery     No family history on file. History  Substance Use Topics  . Smoking status: Never Smoker   . Smokeless tobacco: Never Used  . Alcohol Use: No   OB History   Grav Para Term Preterm Abortions TAB SAB Ect Mult Living                 Review of Systems  Constitutional: Negative for fever and chills.  Respiratory: Negative for cough, chest tightness and shortness of breath.   Cardiovascular: Negative for chest pain, palpitations and leg swelling.  Gastrointestinal: Negative for nausea, vomiting, abdominal pain and diarrhea.  Genitourinary: Negative for dysuria, flank pain, vaginal  bleeding, vaginal discharge, vaginal pain and pelvic pain.  Musculoskeletal: Negative for arthralgias, myalgias, neck pain and neck stiffness.  Skin: Negative for rash.  Neurological: Positive for headaches. Negative for dizziness and weakness.  All other systems reviewed and are negative.      Allergies  Codeine; Cortisone; and Other  Home Medications   Current Outpatient Rx  Name  Route  Sig  Dispense  Refill  . acetaminophen (TYLENOL) 650 MG CR tablet   Oral   Take 650 mg by mouth every 12 (twelve) hours.         . BusPIRone HCl (BUSPAR PO)   Oral   Take 10 mg by mouth 2 (two) times daily.         . cimetidine (TAGAMET) 800 MG tablet   Oral   Take 800 mg by mouth 2 (two) times daily.         Marland Kitchen gabapentin (NEURONTIN) 300 MG capsule   Oral   Take 300 mg by mouth at bedtime.          Marland Kitchen levothyroxine (SYNTHROID, LEVOTHROID) 112 MCG tablet   Oral   Take 112 mcg by mouth daily before breakfast.         . traZODone (DESYREL) 150 MG tablet   Oral   Take 100 mg by mouth at bedtime.          Marland Kitchen  venlafaxine (EFFEXOR) 100 MG tablet   Oral   Take 100 mg by mouth 3 (three) times daily.         . Vitamin D, Ergocalciferol, (DRISDOL) 50000 UNITS CAPS capsule   Oral   Take 50,000 Units by mouth every 14 (fourteen) days.          There were no vitals taken for this visit. Physical Exam  Nursing note and vitals reviewed. Constitutional: She is oriented to person, place, and time. She appears well-developed and well-nourished. No distress.  HENT:  Head: Normocephalic.  Hematoma to the back of the head  Eyes: Conjunctivae are normal.  Neck: Neck supple.  Cardiovascular: Normal rate, regular rhythm and normal heart sounds.   Pulmonary/Chest: Effort normal and breath sounds normal. No respiratory distress. She has no wheezes. She has no rales.  Abdominal: Soft. Bowel sounds are normal. She exhibits no distension. There is no tenderness. There is no rebound.   Musculoskeletal: She exhibits no edema.  Normal appearing left arm and shoulder. Pain with rom of left shoulder. Radial pulses intact.  Neurological: She is alert and oriented to person, place, and time. No cranial nerve deficit. Coordination normal.  Skin: Skin is warm and dry.  Psychiatric: She has a normal mood and affect. Her behavior is normal.    ED Course  Procedures (including critical care time) Labs Review Labs Reviewed  CBC WITH DIFFERENTIAL - Abnormal; Notable for the following:    WBC 11.0 (*)    Hemoglobin 15.4 (*)    MCHC 36.2 (*)    Neutrophils Relative % 81 (*)    Neutro Abs 8.9 (*)    Lymphocytes Relative 11 (*)    All other components within normal limits  URINE CULTURE  COMPREHENSIVE METABOLIC PANEL  URINALYSIS, ROUTINE W REFLEX MICROSCOPIC   Imaging Review Ct Head Wo Contrast  04/03/2013   CLINICAL DATA:  Status post fall.  EXAM: CT HEAD WITHOUT CONTRAST  CT CERVICAL SPINE WITHOUT CONTRAST  TECHNIQUE: Multidetector CT imaging of the head and cervical spine was performed following the standard protocol without intravenous contrast. Multiplanar CT image reconstructions of the cervical spine were also generated.  COMPARISON:  Head and cervical spine CT scans 02/16/2013.  FINDINGS: CT HEAD FINDINGS  There is mild atrophy and chronic microvascular ischemic change. Contusion over the high right parietal bone is identified without underlying fracture or foreign body. There is no evidence of acute intracranial abnormality including infarct, hemorrhage, mass lesion, mass effect, midline shift or abnormal extra-axial fluid collection. No hydrocephalus or pneumocephalus. The calvarium is intact.  CT CERVICAL SPINE FINDINGS  There is no fracture or malalignment of the cervical spine. Degenerative disc disease is seen at C5-6 where there is loss of disc space height and prominent endplate spurring. Lung apices are clear.  IMPRESSION: Posterior scalp contusion on the right without  underlying fracture or acute intracranial abnormality  No acute abnormality of the cervical spine.   Electronically Signed   By: Inge Rise M.D.   On: 04/03/2013 11:33   Ct Cervical Spine Wo Contrast  04/03/2013   CLINICAL DATA:  Status post fall.  EXAM: CT HEAD WITHOUT CONTRAST  CT CERVICAL SPINE WITHOUT CONTRAST  TECHNIQUE: Multidetector CT imaging of the head and cervical spine was performed following the standard protocol without intravenous contrast. Multiplanar CT image reconstructions of the cervical spine were also generated.  COMPARISON:  Head and cervical spine CT scans 02/16/2013.  FINDINGS: CT HEAD FINDINGS  There is mild atrophy  and chronic microvascular ischemic change. Contusion over the high right parietal bone is identified without underlying fracture or foreign body. There is no evidence of acute intracranial abnormality including infarct, hemorrhage, mass lesion, mass effect, midline shift or abnormal extra-axial fluid collection. No hydrocephalus or pneumocephalus. The calvarium is intact.  CT CERVICAL SPINE FINDINGS  There is no fracture or malalignment of the cervical spine. Degenerative disc disease is seen at C5-6 where there is loss of disc space height and prominent endplate spurring. Lung apices are clear.  IMPRESSION: Posterior scalp contusion on the right without underlying fracture or acute intracranial abnormality  No acute abnormality of the cervical spine.   Electronically Signed   By: Inge Rise M.D.   On: 04/03/2013 11:33    EKG Interpretation   None       MDM   Final diagnoses:  Fall  Head contusion  Diarrhea  Fracture of left humerus    Patient is a mechanical fall, hit her head on the ground, not sure of there was or was not loss of consciousness. CTs ordered. Patient also reports recent diarrhea and some flulike symptoms recently, labs ordered.  1:46 PM CT is returned negative. Labs unremarkable. Patient is unable to give urine still. Ordered  in out catheter if unable to urinate. Patient does not appear to be in distress asking for food.  3:31 PM UA unremarkable. Pt also seen by social work, will have mental help set up by PCP. Pt reported depression. Also chronic pain. Given history of substance abuse, will continue on tylenol. Follow up closely with pcp.   Pt had an episode of diarrhea in ED. Malodorous, CDIFF pcr sent.   Filed Vitals:   04/03/13 1533  BP: 159/67  Pulse: 80  Temp: 98.3 F (36.8 C)  TempSrc: Oral  Resp: 21  SpO2: 90%      Renold Genta, PA-C 04/04/13 1633

## 2013-04-03 NOTE — ED Notes (Addendum)
Patient transported to ct

## 2013-04-03 NOTE — ED Notes (Addendum)
Per EMS pt from [facility] reports to ED after catching walker on door, tripping, and falling, hit her head on tile floor, mild contusion present on posterior head, no LOC. Per EMS patient is at mental baseline.

## 2013-04-03 NOTE — ED Notes (Signed)
PA at bedside.

## 2013-04-03 NOTE — ED Notes (Signed)
Bed: MV36 Expected date:  Expected time:  Means of arrival:  Comments: Fall - EMS

## 2013-04-03 NOTE — Discharge Instructions (Signed)
Continue Tylenol for pain. Applied ice packs to the arm and into the head. Please followup with a primary care doctor. The cultures of the stool and urine are pending.  Head Injury, Adult You have received a head injury. It does not appear serious at this time. Headaches and vomiting are common following head injury. It should be easy to awaken from sleeping. Sometimes it is necessary for you to stay in the emergency department for a while for observation. Sometimes admission to the hospital may be needed. After injuries such as yours, most problems occur within the first 24 hours, but side effects may occur up to 7 10 days after the injury. It is important for you to carefully monitor your condition and contact your health care provider or seek immediate medical care if there is a change in your condition. WHAT ARE THE TYPES OF HEAD INJURIES? Head injuries can be as minor as a bump. Some head injuries can be more severe. More severe head injuries include:  A jarring injury to the brain (concussion).  A bruise of the brain (contusion). This mean there is bleeding in the brain that can cause swelling.  A cracked skull (skull fracture).  Bleeding in the brain that collects, clots, and forms a bump (hematoma). WHAT CAUSES A HEAD INJURY? A serious head injury is most likely to happen to someone who is in a car wreck and is not wearing a seat belt. Other causes of major head injuries include bicycle or motorcycle accidents, sports injuries, and falls. HOW ARE HEAD INJURIES DIAGNOSED? A complete history of the event leading to the injury and your current symptoms will be helpful in diagnosing head injuries. Many times, pictures of the brain, such as CT or MRI are needed to see the extent of the injury. Often, an overnight hospital stay is necessary for observation.  WHEN SHOULD I SEEK IMMEDIATE MEDICAL CARE?  You should get help right away if:  You have confusion or drowsiness.  You feel sick to  your stomach (nauseous) or have continued, forceful vomiting.  You have dizziness or unsteadiness that is getting worse.  You have severe, continued headaches not relieved by medicine. Only take over-the-counter or prescription medicines for pain, fever, or discomfort as directed by your health care provider.  You do not have normal function of the arms or legs or are unable to walk.  You notice changes in the black spots in the center of the colored part of your eye (pupil).  You have a clear or bloody fluid coming from your nose or ears.  You have a loss of vision. During the next 24 hours after the injury, you must stay with someone who can watch you for the warning signs. This person should contact local emergency services (911 in the [place] if you have seizures, you become unconscious, or you are unable to wake up. HOW CAN I PREVENT A HEAD INJURY IN THE FUTURE? The most important factor for preventing major head injuries is avoiding motor vehicle accidents. To minimize the potential for damage to your head, it is crucial to wear seat belts while riding in motor vehicles. Wearing helmets while bike riding and playing collision sports (like football) is also helpful. Also, avoiding dangerous activities around the house will further help reduce your risk of head injury.  WHEN CAN I RETURN TO NORMAL ACTIVITIES AND ATHLETICS? You should be reevaluated by your health care provider before returning to these activities. If you have any of the following  symptoms, you should not return to activities or contact sports until 1 week after the symptoms have stopped:  Persistent headache.  Dizziness or vertigo.  Poor attention and concentration.  Confusion.  Memory problems.  Nausea or vomiting.  Fatigue or tire easily.  Irritability.  Intolerant of bright lights or loud noises.  Anxiety or depression.  Disturbed sleep. MAKE SURE YOU:   Understand these instructions.  Will watch  your condition.  Will get help right away if you are not doing well or get worse. Document Released: 02/06/2005 Document Revised: 11/27/2012 Document Reviewed: 10/14/2012 [facility] Patient Information 2014 [place].

## 2013-04-03 NOTE — ED Notes (Signed)
MD at bedside. 

## 2013-04-03 NOTE — ED Notes (Signed)
Patient ready to go home when ride arrives- except may need updated vitals.

## 2013-04-04 LAB — URINE CULTURE
Colony Count: NO GROWTH
Culture: NO GROWTH

## 2013-04-04 LAB — CLOSTRIDIUM DIFFICILE BY PCR: Toxigenic C. Difficile by PCR: NEGATIVE

## 2013-04-10 NOTE — ED Provider Notes (Signed)
Medical screening examination/treatment/procedure(s) were conducted as a shared visit with non-physician practitioner(s) and myself.  I personally evaluated the patient during the encounter.  EKG Interpretation    Date/Time:  Thursday April 03 2013 11:42:35 EST Ventricular Rate:  69 PR Interval:  158 QRS Duration: 114 QT Interval:  422 QTC Calculation: 452 R Axis:     Text Interpretation:  Sinus rhythm Ventricular premature complex Borderline intraventricular conduction delay Low voltage, precordial leads Baseline wander in lead(s) V2 V3 V5 V6 Similar to prior Confirmed by Mingo Amber  MD, Brayan Votaw ([id number]) on 04/03/2013 12:45:44 PM             70F here s/p mechanical fall. She fell backwards while being rolled out of the bathroom. No LOC. Able to get up with assistance. Hx of L arm fracture a few months ago. Normal exam. Belly benign. Imaging without new findings, labs ok. Stable for discharge.  Osvaldo Shipper, MD 04/10/13 [phone]

## 2013-06-04 ENCOUNTER — Emergency Department (HOSPITAL_COMMUNITY): Payer: Medicare Other

## 2013-06-04 ENCOUNTER — Encounter (HOSPITAL_COMMUNITY): Payer: Self-pay | Admitting: Emergency Medicine

## 2013-06-04 ENCOUNTER — Inpatient Hospital Stay (HOSPITAL_COMMUNITY)
Admission: EM | Admit: 2013-06-04 | Discharge: 2013-06-06 | DRG: 689 | Disposition: A | Discharge status: Nursing Home (Includes ICF) | Payer: Medicare Other | Attending: Internal Medicine | Admitting: Internal Medicine

## 2013-06-04 DIAGNOSIS — I10 Essential (primary) hypertension: Secondary | ICD-10-CM

## 2013-06-04 DIAGNOSIS — E860 Dehydration: Secondary | ICD-10-CM | POA: Diagnosis present

## 2013-06-04 DIAGNOSIS — Y9210 Unspecified residential institution as the place of occurrence of the external cause: Secondary | ICD-10-CM | POA: Diagnosis present

## 2013-06-04 DIAGNOSIS — G9340 Encephalopathy, unspecified: Secondary | ICD-10-CM | POA: Diagnosis present

## 2013-06-04 DIAGNOSIS — R4182 Altered mental status, unspecified: Secondary | ICD-10-CM | POA: Diagnosis present

## 2013-06-04 DIAGNOSIS — N390 Urinary tract infection, site not specified: Principal | ICD-10-CM | POA: Diagnosis present

## 2013-06-04 DIAGNOSIS — W010XXA Fall on same level from slipping, tripping and stumbling without subsequent striking against object, initial encounter: Secondary | ICD-10-CM | POA: Diagnosis present

## 2013-06-04 DIAGNOSIS — Z6825 Body mass index (BMI) 25.0-25.9, adult: Secondary | ICD-10-CM

## 2013-06-04 DIAGNOSIS — R443 Hallucinations, unspecified: Secondary | ICD-10-CM | POA: Diagnosis present

## 2013-06-04 DIAGNOSIS — G589 Mononeuropathy, unspecified: Secondary | ICD-10-CM | POA: Diagnosis present

## 2013-06-04 DIAGNOSIS — Z9181 History of falling: Secondary | ICD-10-CM

## 2013-06-04 DIAGNOSIS — Z79899 Other long term (current) drug therapy: Secondary | ICD-10-CM

## 2013-06-04 DIAGNOSIS — E039 Hypothyroidism, unspecified: Secondary | ICD-10-CM | POA: Diagnosis present

## 2013-06-04 LAB — URINALYSIS, ROUTINE W REFLEX MICROSCOPIC
Glucose, UA: NEGATIVE mg/dL
Hgb urine dipstick: NEGATIVE
Ketones, ur: >80 mg/dL — AB
Nitrite: NEGATIVE
PH: 6 (ref 5.0–8.0)
Protein, ur: 30 mg/dL — AB
SPECIFIC GRAVITY, URINE: 1.025 (ref 1.005–1.030)
Urobilinogen, UA: 1 mg/dL (ref 0.0–1.0)

## 2013-06-04 LAB — CBC WITH DIFFERENTIAL/PLATELET
BASOS PCT: 0 % (ref 0–1)
Basophils Absolute: 0 10*3/uL (ref 0.0–0.1)
Eosinophils Absolute: 0 10*3/uL (ref 0.0–0.7)
Eosinophils Relative: 0 % (ref 0–5)
HCT: 50.4 % — ABNORMAL HIGH (ref 36.0–46.0)
HEMOGLOBIN: 17.9 g/dL — AB (ref 12.0–15.0)
LYMPHS ABS: 1.3 10*3/uL (ref 0.7–4.0)
Lymphocytes Relative: 9 % — ABNORMAL LOW (ref 12–46)
MCH: 30.4 pg (ref 26.0–34.0)
MCHC: 35.5 g/dL (ref 30.0–36.0)
MCV: 85.7 fL (ref 78.0–100.0)
Monocytes Absolute: 1 10*3/uL (ref 0.1–1.0)
Monocytes Relative: 7 % (ref 3–12)
NEUTROS PCT: 84 % — AB (ref 43–77)
Neutro Abs: 12.3 10*3/uL — ABNORMAL HIGH (ref 1.7–7.7)
Platelets: 280 10*3/uL (ref 150–400)
RBC: 5.88 MIL/uL — AB (ref 3.87–5.11)
RDW: 13.1 % (ref 11.5–15.5)
WBC: 14.5 10*3/uL — AB (ref 4.0–10.5)

## 2013-06-04 LAB — BASIC METABOLIC PANEL
BUN: 20 mg/dL (ref 6–23)
CO2: 20 mEq/L (ref 19–32)
Calcium: 10.5 mg/dL (ref 8.4–10.5)
Chloride: 100 mEq/L (ref 96–112)
Creatinine, Ser: 0.67 mg/dL (ref 0.50–1.10)
GFR calc non Af Amer: 83 mL/min — ABNORMAL LOW (ref >90)
Glucose, Bld: 142 mg/dL — ABNORMAL HIGH (ref 70–99)
Potassium: 3.5 mEq/L — ABNORMAL LOW (ref 3.7–5.3)
Sodium: 140 mEq/L (ref 137–147)

## 2013-06-04 LAB — URINE MICROSCOPIC-ADD ON

## 2013-06-04 LAB — TSH: TSH: 1.67 u[IU]/mL (ref 0.350–4.500)

## 2013-06-04 MED ORDER — ACETAMINOPHEN 325 MG PO TABS: 650.0000 mg | ORAL_TABLET | Freq: Four times a day (QID) | ORAL | Status: DC | PRN

## 2013-06-04 MED ORDER — SODIUM CHLORIDE 0.9 % IV BOLUS (SEPSIS)
1000.0000 mL | Freq: Once | INTRAVENOUS | Status: AC
  Administered 2013-06-04: 1000 mL via INTRAVENOUS

## 2013-06-04 MED ORDER — CEFTRIAXONE SODIUM 1 G IJ SOLR
1.0000 g | INTRAMUSCULAR | Status: DC
  Administered 2013-06-05 – 2013-06-06 (×2): 1 g via INTRAVENOUS
  Filled 2013-06-04 (×2): qty 10

## 2013-06-04 MED ORDER — TRAMADOL HCL 50 MG PO TABS
50.0000 mg | ORAL_TABLET | Freq: Four times a day (QID) | ORAL | Status: DC | PRN
  Administered 2013-06-04 – 2013-06-06 (×6): 50 mg via ORAL
  Filled 2013-06-04 (×6): qty 1

## 2013-06-04 MED ORDER — TRAZODONE HCL 50 MG PO TABS: 100.0000 mg | ORAL_TABLET | Freq: Every evening | ORAL | Status: DC | PRN

## 2013-06-04 MED ORDER — KETOTIFEN FUMARATE 0.025 % OP SOLN
1.0000 [drp] | Freq: Two times a day (BID) | OPHTHALMIC | Status: DC
  Administered 2013-06-04 – 2013-06-06 (×5): 1 [drp] via OPHTHALMIC
  Filled 2013-06-04: qty 5

## 2013-06-04 MED ORDER — CALCIUM CARBONATE ANTACID 500 MG PO CHEW
1.0000 | CHEWABLE_TABLET | Freq: Three times a day (TID) | ORAL | Status: DC | PRN
  Filled 2013-06-04: qty 1

## 2013-06-04 MED ORDER — PANTOPRAZOLE SODIUM 40 MG PO TBEC
40.0000 mg | DELAYED_RELEASE_TABLET | Freq: Every day | ORAL | Status: DC
  Administered 2013-06-04 – 2013-06-06 (×3): 40 mg via ORAL
  Filled 2013-06-04 (×3): qty 1

## 2013-06-04 MED ORDER — METHOCARBAMOL 500 MG PO TABS: 1000.0000 mg | ORAL_TABLET | Freq: Three times a day (TID) | ORAL | Status: DC

## 2013-06-04 MED ORDER — BUSPIRONE HCL 10 MG PO TABS
10.0000 mg | ORAL_TABLET | Freq: Two times a day (BID) | ORAL | Status: DC
  Administered 2013-06-04 – 2013-06-06 (×5): 10 mg via ORAL
  Filled 2013-06-04 (×6): qty 1

## 2013-06-04 MED ORDER — GABAPENTIN 300 MG PO CAPS
300.0000 mg | ORAL_CAPSULE | Freq: Every day | ORAL | Status: DC
  Administered 2013-06-04 – 2013-06-05 (×2): 300 mg via ORAL
  Filled 2013-06-04 (×3): qty 1

## 2013-06-04 MED ORDER — LOPERAMIDE HCL 2 MG PO CAPS: 2.0000 mg | ORAL_CAPSULE | ORAL | Status: DC | PRN

## 2013-06-04 MED ORDER — HEPARIN SODIUM (PORCINE) 5000 UNIT/ML IJ SOLN
5000.0000 [IU] | Freq: Three times a day (TID) | INTRAMUSCULAR | Status: DC
  Administered 2013-06-04 – 2013-06-06 (×8): 5000 [IU] via SUBCUTANEOUS
  Filled 2013-06-04 (×10): qty 1

## 2013-06-04 MED ORDER — DEXTROSE 5 % IV SOLN
1.0000 g | Freq: Once | INTRAVENOUS | Status: AC
  Administered 2013-06-04: 1 g via INTRAVENOUS
  Filled 2013-06-04: qty 10

## 2013-06-04 MED ORDER — VENLAFAXINE HCL 50 MG PO TABS
100.0000 mg | ORAL_TABLET | Freq: Three times a day (TID) | ORAL | Status: DC
  Administered 2013-06-04 – 2013-06-06 (×7): 100 mg via ORAL
  Filled 2013-06-04 (×9): qty 2

## 2013-06-04 MED ORDER — LEVOTHYROXINE SODIUM 112 MCG PO TABS
112.0000 ug | ORAL_TABLET | Freq: Every day | ORAL | Status: DC
  Administered 2013-06-04 – 2013-06-06 (×3): 112 ug via ORAL
  Filled 2013-06-04 (×5): qty 1

## 2013-06-04 MED ORDER — METHOCARBAMOL 500 MG PO TABS
500.0000 mg | ORAL_TABLET | Freq: Three times a day (TID) | ORAL | Status: DC | PRN
  Administered 2013-06-04 – 2013-06-06 (×4): 500 mg via ORAL
  Filled 2013-06-04 (×4): qty 1

## 2013-06-04 MED ORDER — TRAMADOL HCL 50 MG PO TABS
50.0000 mg | ORAL_TABLET | Freq: Three times a day (TID) | ORAL | Status: DC
  Administered 2013-06-04: 50 mg via ORAL
  Filled 2013-06-04: qty 1

## 2013-06-04 MED ORDER — SODIUM CHLORIDE 0.9 % IV SOLN
INTRAVENOUS | Status: DC
  Administered 2013-06-04 – 2013-06-05 (×4): via INTRAVENOUS

## 2013-06-04 NOTE — H&P (Signed)
[facility] History and Physical  Erika Conley Erika Conley DOB: Jul 13, 1934 DOA: 06/04/2013  Referring physician: EDP PCP: Leonard Downing, MD   Chief Complaint: AMS   HPI: Erika Conley is a 79 y.o. female who is brought in to the ED with AMS, hallucinations, and an un witnessed fall.  Per nursing home patient has been refusing medications for the past 2 days.  At baseline she is able to answer questions normally and follow commands.  Today they state she was talking about people who had died, fire trucks being in the room, and being flown to [facility] in a helicopter for her broken back and arm.  They found her lying on the bathroom floor, with a pillow and a blanket, would not get up off of floor or follow commands.  Unclear wether patient fell or more likely given her current mental status just decided to take a nap on the bathroom floor.  Thankfully no apparent injury on work up in ED.  Review of Systems: Unable to perform due to mental status.  Past Medical History  Diagnosis Date  . Falls frequently   . Arthritis   . Hypertension   . Fracture, clavicle   . Thyroid disease   . History of fractured vertebra   . Wrist fracture, right    Past Surgical History  Procedure Laterality Date  . Thyroidectomy    . Abdominal hysterectomy    . Back surgery    . Cholecystectomy    . Wrist surgery    . Fracture surgery     Social History:  reports that she has never smoked. She has never used smokeless tobacco. She reports that she does not drink alcohol or use illicit drugs.  Allergies  Allergen Reactions  . Codeine     Pass out, fall  . Cortisone     Pass out, fall   . Other     All narcotics cause patient to Pass out, fall     History reviewed. No pertinent family history.   Prior to Admission medications   Medication Sig Start Date End Date Taking? Authorizing Provider  BusPIRone HCl (BUSPAR PO) Take 10 mg by mouth 2 (two) times daily.    Yes Historical Provider, MD  calcium carbonate (TUMS - DOSED IN MG ELEMENTAL CALCIUM) 500 MG chewable tablet Chew 1 tablet by mouth 3 (three) times daily as needed for indigestion or heartburn.   Yes Historical Provider, MD  gabapentin (NEURONTIN) 300 MG capsule Take 300 mg by mouth at bedtime.    Yes Historical Provider, MD  ketotifen (ZADITOR) 0.025 % ophthalmic solution Place 1 drop into both eyes 2 (two) times daily.   Yes Historical Provider, MD  levothyroxine (SYNTHROID, LEVOTHROID) 112 MCG tablet Take 112 mcg by mouth daily before breakfast.   Yes Historical Provider, MD  loperamide (IMODIUM) 2 MG capsule Take 2 mg by mouth as needed for diarrhea or loose stools.   Yes Historical Provider, MD  methocarbamol (ROBAXIN) 500 MG tablet Take 1,000 mg by mouth 3 (three) times daily.   Yes Historical Provider, MD  pantoprazole (PROTONIX) 40 MG tablet Take 40 mg by mouth daily.   Yes Historical Provider, MD  traMADol (ULTRAM) 50 MG tablet Take 50 mg by mouth 4 (four) times daily -  before meals and at bedtime.   Yes Historical Provider, MD  traZODone (DESYREL) 100 MG tablet Take 100 mg by mouth at bedtime as needed for sleep.   Yes Historical Provider, MD  venlafaxine (EFFEXOR) 100 MG tablet Take 100 mg by mouth 3 (three) times daily.   Yes Historical Provider, MD  Vitamin D, Ergocalciferol, (DRISDOL) 50000 UNITS CAPS capsule Take 50,000 Units by mouth every 14 (fourteen) days.   Yes Historical Provider, MD   Physical Exam: Filed Vitals:   06/04/13 0030  BP: 191/83  Pulse: 98  Temp: 98.2 F (36.8 C)    BP 191/83  Pulse 98  Temp(Src) 98.2 F (36.8 C) (Axillary)  SpO2 90%  General Appearance:    Alert, oriented, no distress, appears stated age  Head:    Normocephalic, atraumatic  Eyes:    PERRL, EOMI, sclera non-icteric        Nose:   Nares without drainage or epistaxis. Mucosa, turbinates normal  Throat:   Moist mucous membranes. Oropharynx without erythema or exudate.  Neck:   Supple.  No carotid bruits.  No thyromegaly.  No lymphadenopathy.   Back:     No CVA tenderness, no spinal tenderness  Lungs:     Clear to auscultation bilaterally, without wheezes, rhonchi or rales  Chest wall:    No tenderness to palpitation  Heart:    Regular rate and rhythm without murmurs, gallops, rubs  Abdomen:     Soft, non-tender, nondistended, normal bowel sounds, no organomegaly  Genitalia:    deferred  Rectal:    deferred  Extremities:   No clubbing, cyanosis or edema.  Pulses:   2+ and symmetric all extremities  Skin:   Skin color, texture, turgor normal, no rashes or lesions  Lymph nodes:   Cervical, supraclavicular, and axillary nodes normal  Neurologic:   CNII-XII intact. Normal strength, sensation and reflexes      throughout    Labs on Admission:  Basic Metabolic Panel:  Recent Labs Lab 06/04/13 0217  NA 140  K 3.5*  CL 100  CO2 20  GLUCOSE 142*  BUN 20  CREATININE 0.67  CALCIUM 10.5   Liver Function Tests: No results found for this basename: AST, ALT, ALKPHOS, BILITOT, PROT, ALBUMIN,  in the last 168 hours No results found for this basename: LIPASE, AMYLASE,  in the last 168 hours No results found for this basename: AMMONIA,  in the last 168 hours CBC:  Recent Labs Lab 06/04/13 0217  WBC 14.5*  NEUTROABS 12.3*  HGB 17.9*  HCT 50.4*  MCV 85.7  PLT 280   Cardiac Enzymes: No results found for this basename: CKTOTAL, CKMB, CKMBINDEX, TROPONINI,  in the last 168 hours  BNP (last 3 results) No results found for this basename: PROBNP,  in the last 8760 hours CBG: No results found for this basename: GLUCAP,  in the last 168 hours  Radiological Exams on Admission: Dg Hip Complete Left  06/04/2013   CLINICAL DATA:  Fall.  EXAM: LEFT HIP - COMPLETE 2+ VIEW  COMPARISON:  None available for comparison at time of study interpretation.  FINDINGS: Slight sclerotic line at the left femoral head neck junction, no dislocation. Moderate to severe left and moderate right  femoral osteoarthropathy with femoral head spurring on the left. No destructive bony lesions. Periarticular soft tissue planes are nonsuspicious.  IMPRESSION: Sclerotic line at the left femoral head neck junction, which could reflect mild nondisplaced impacted fracture. No dislocation. Moderate to severe left hip osteoarthrosis.   Electronically Signed   By: Elon Alas   On: 06/04/2013 03:03   Ct Head Wo Contrast  06/04/2013   CLINICAL DATA:  Altered mental status  EXAM: CT HEAD  WITHOUT CONTRAST  CT CERVICAL SPINE WITHOUT CONTRAST  TECHNIQUE: Multidetector CT imaging of the head and cervical spine was performed following the standard protocol without intravenous contrast. Multiplanar CT image reconstructions of the cervical spine were also generated.  COMPARISON:  None.  FINDINGS: CT HEAD FINDINGS  There is no evidence of mass effect, midline shift, or extra-axial fluid collections. There is no evidence of a space-occupying lesion or intracranial hemorrhage. There is no evidence of a cortical-based area of acute infarction. There is generalized cerebral atrophy. There is periventricular white matter low attenuation likely secondary to microangiopathy.  The ventricles and sulci are appropriate for the patient's age. The basal cisterns are patent.  Visualized portions of the orbits are unremarkable. The visualized portions of the paranasal sinuses and mastoid air cells are unremarkable. Cerebrovascular atherosclerotic calcifications are noted.  The osseous structures are unremarkable.  CT CERVICAL SPINE FINDINGS  The alignment is anatomic. The vertebral body heights are maintained. There is no acute fracture. There is no static listhesis. The prevertebral soft tissues are normal. The intraspinal soft tissues are not fully imaged on this examination due to poor soft tissue contrast, but there is no gross soft tissue abnormality.  There is degenerative disc disease most significant at C5-C6 with disc space  narrowing and discogenic endplate osteophytes. There is a broad-based disc osteophyte complex at C5-6 with mild impression on the ventral thecal sac. There is bilateral facet arthropathy throughout the cervical spine. There is bilateral foraminal stenosis at C5-6. There is left foraminal stenosis at C3-4.  The visualized portions of the lung apices demonstrate no focal abnormality.  IMPRESSION: 1. No acute intracranial pathology. 2. No acute osseous injuries cervical spine. 3. Cervical spine spondylosis as described above.   Electronically Signed   By: Kathreen Devoid   On: 06/04/2013 02:53   Ct Cervical Spine Wo Contrast  06/04/2013   CLINICAL DATA:  Altered mental status  EXAM: CT HEAD WITHOUT CONTRAST  CT CERVICAL SPINE WITHOUT CONTRAST  TECHNIQUE: Multidetector CT imaging of the head and cervical spine was performed following the standard protocol without intravenous contrast. Multiplanar CT image reconstructions of the cervical spine were also generated.  COMPARISON:  None.  FINDINGS: CT HEAD FINDINGS  There is no evidence of mass effect, midline shift, or extra-axial fluid collections. There is no evidence of a space-occupying lesion or intracranial hemorrhage. There is no evidence of a cortical-based area of acute infarction. There is generalized cerebral atrophy. There is periventricular white matter low attenuation likely secondary to microangiopathy.  The ventricles and sulci are appropriate for the patient's age. The basal cisterns are patent.  Visualized portions of the orbits are unremarkable. The visualized portions of the paranasal sinuses and mastoid air cells are unremarkable. Cerebrovascular atherosclerotic calcifications are noted.  The osseous structures are unremarkable.  CT CERVICAL SPINE FINDINGS  The alignment is anatomic. The vertebral body heights are maintained. There is no acute fracture. There is no static listhesis. The prevertebral soft tissues are normal. The intraspinal soft tissues  are not fully imaged on this examination due to poor soft tissue contrast, but there is no gross soft tissue abnormality.  There is degenerative disc disease most significant at C5-C6 with disc space narrowing and discogenic endplate osteophytes. There is a broad-based disc osteophyte complex at C5-6 with mild impression on the ventral thecal sac. There is bilateral facet arthropathy throughout the cervical spine. There is bilateral foraminal stenosis at C5-6. There is left foraminal stenosis at C3-4.  The visualized  portions of the lung apices demonstrate no focal abnormality.  IMPRESSION: 1. No acute intracranial pathology. 2. No acute osseous injuries cervical spine. 3. Cervical spine spondylosis as described above.   Electronically Signed   By: Kathreen Devoid   On: 06/04/2013 02:53   Ct Hip Left Wo Contrast  06/04/2013   CLINICAL DATA:  Left hip pain  EXAM: CT OF THE LEFT HIP WITHOUT CONTRAST  TECHNIQUE: Multidetector CT imaging was performed according to the standard protocol. Multiplanar CT image reconstructions were also generated.  COMPARISON:  None.  FINDINGS: There is severe osteoarthritis of the left hip. There is no acute fracture or dislocation. The left superior and inferior pubic rami are intact. There is no fluid collection or hematoma. There is soft tissue contusion in the upper medial buttock.  IMPRESSION: 1. No acute osseous injury of the left hip. 2. Severe osteoarthritis of the left hip.   Electronically Signed   By: Kathreen Devoid   On: 06/04/2013 03:52    EKG: Independently reviewed.  Assessment/Plan Principal Problem:   UTI (urinary tract infection) Active Problems:   Altered mental status   1. UTI - treating with rocephin, monitor leukocytosis 2. Dehydration - IVF at 75 cc/hr 3. AMS - regarding the patients mental status, she is quite clearly waxing and waning, she had flight of ideas and tangential thinking, she certianly has a degree of delirium, but I suspect she also has a  degree of acute psychosis, probably due to sudden withdrawal from psychotropic drugs.  Will restart home meds and treat her UTI.    Code Status: Full Code  Family Communication: No family in room, EDP left message on Son's voicemail Disposition Plan: Admit to inpatient   Time spent: 70 min  [facility] Pager [phone]  If 7AM-7PM, please contact the day team taking care of the patient [email or web address] Password [facility] 06/04/2013, 5:31 AM

## 2013-06-04 NOTE — ED Provider Notes (Signed)
TIME SEEN: 1:35 AM  CHIEF COMPLAINT: Altered mental status, hallucinations, unwitnessed fall  HPI: Patient is a 79 year old female with a history of hypertension, hypothyroidism, possible behavioral disorder who presents emergency room with altered mental status. History is obtained from nurse at nursing facility, Wyn Quaker at [phone].  She reports the patient is normally able to hold a conversation and answer questions appropriately and follow commands. Today they state she was talking about people that had died, fire trucks being in the room and being flown to [facility] in a helicopter for her broken back and arm. They found her lying on the floor covered in a blanket today. She would not get up off the floor follow commands. She has refused her medication for the past 2 days.  ROS: See HPI Constitutional: no fever  Eyes: no drainage  ENT: no runny nose   Cardiovascular:  no chest pain  Resp: no SOB  GI: no vomiting GU: no dysuria Integumentary: no rash  Allergy: no hives  Musculoskeletal: no leg swelling  Neurological: no slurred speech ROS otherwise negative  PAST MEDICAL HISTORY/PAST SURGICAL HISTORY:  Past Medical History  Diagnosis Date  . Falls frequently   . Arthritis   . Hypertension   . Fracture, clavicle   . Thyroid disease   . History of fractured vertebra   . Wrist fracture, right     MEDICATIONS:  Prior to Admission medications   Medication Sig Start Date End Date Taking? Authorizing Provider  BusPIRone HCl (BUSPAR PO) Take 10 mg by mouth 2 (two) times daily.   Yes Historical Provider, MD  calcium carbonate (TUMS - DOSED IN MG ELEMENTAL CALCIUM) 500 MG chewable tablet Chew 1 tablet by mouth 3 (three) times daily as needed for indigestion or heartburn.   Yes Historical Provider, MD  gabapentin (NEURONTIN) 300 MG capsule Take 300 mg by mouth at bedtime.    Yes Historical Provider, MD  ketotifen (ZADITOR) 0.025 % ophthalmic solution Place 1  drop into both eyes 2 (two) times daily.   Yes Historical Provider, MD  levothyroxine (SYNTHROID, LEVOTHROID) 112 MCG tablet Take 112 mcg by mouth daily before breakfast.   Yes Historical Provider, MD  loperamide (IMODIUM) 2 MG capsule Take 2 mg by mouth as needed for diarrhea or loose stools.   Yes Historical Provider, MD  methocarbamol (ROBAXIN) 500 MG tablet Take 1,000 mg by mouth 3 (three) times daily.   Yes Historical Provider, MD  pantoprazole (PROTONIX) 40 MG tablet Take 40 mg by mouth daily.   Yes Historical Provider, MD  traMADol (ULTRAM) 50 MG tablet Take 50 mg by mouth 4 (four) times daily -  before meals and at bedtime.   Yes Historical Provider, MD  traZODone (DESYREL) 100 MG tablet Take 100 mg by mouth at bedtime as needed for sleep.   Yes Historical Provider, MD  venlafaxine (EFFEXOR) 100 MG tablet Take 100 mg by mouth 3 (three) times daily.   Yes Historical Provider, MD  Vitamin D, Ergocalciferol, (DRISDOL) 50000 UNITS CAPS capsule Take 50,000 Units by mouth every 14 (fourteen) days.   Yes Historical Provider, MD    ALLERGIES:  Allergies  Allergen Reactions  . Codeine     Pass out, fall  . Cortisone     Pass out, fall   . Other     All narcotics cause patient to Pass out, fall     SOCIAL HISTORY:  History  Substance Use Topics  . Smoking status: Never Smoker   .  Smokeless tobacco: Never Used  . Alcohol Use: No    FAMILY HISTORY: History reviewed. No pertinent family history.  EXAM: BP 191/83  Pulse 98  Temp(Src) 98.2 F (36.8 C) (Axillary)  SpO2 90% CONSTITUTIONAL: Alert and oriented to person only and will only answer questions intermittently but does not do so appropriately. Well-appearing; well-nourished; GCS 15 HEAD: Normocephalic; atraumatic EYES: Conjunctivae clear, PERRL, EOMI ENT: normal nose; no rhinorrhea; moist mucous membranes; pharynx without lesions noted; no dental injury;  no septal hematoma NECK: Supple, no meningismus, no LAD; no midline  spinal tenderness, step-off or deformity CARD: RRR; S1 and S2 appreciated; no murmurs, no clicks, no rubs, no gallops RESP: Normal chest excursion without splinting or tachypnea; breath sounds clear and equal bilaterally; no wheezes, no rhonchi, no rales; chest wall stable, nontender to palpation ABD/GI: Normal bowel sounds; non-distended; soft, non-tender, no rebound, no guarding PELVIS:  stable, nontender to palpation mild tenderness to palpation over the left lateral hip with no obvious deformity or likely discrepancy BACK:  The back appears normal and is non-tender to palpation, there is no CVA tenderness; no midline spinal tenderness, step-off or deformity EXT: Normal ROM in all joints; non-tender to palpation; no edema; normal capillary refill; no cyanosis    SKIN: Normal color for age and race; warm NEURO: Moves all extremities equally, follows commands intermittently PSYCH: The patient's mood and manner are appropriate. Grooming and personal hygiene are appropriate.  MEDICAL DECISION MAKING: Vision here with altered mental status. She is hemodynamically stable. We'll obtain CT imaging of her head, cervical spine. We'll also obtain imaging of her left hip. We'll also obtain labs and urine.  ED PROGRESS: Patient has a leukocytosis with left shift. Her blood work appears hemoconcentrated and she has ketones in her urine. Will give IV fluid. She also appears to have urinary tract infection with moderate leukocytes and many bacteria. Culture pending. CT of her head, cervical spine negative. X-ray of her left hip showed a possible fracture but CT scan shows no acute injury. Given she is much different than her baseline, will admit for UTI and altered mental status. Attempted to contact patient's son Juvia Aerts at [phone] and left message.    EKG Interpretation  Date/Time:  Wednesday June 04 2013 00:24:44 EDT Ventricular Rate:  104 PR Interval:  154 QRS Duration: 83 QT Interval:  378 QTC  Calculation: 497 R Axis:   -26 Text Interpretation:  Age not entered, assumed to be  79 years old for purpose of ECG interpretation Sinus tachycardia with irregular rate Borderline left axis deviation Low voltage, precordial leads Borderline prolonged QT interval No significant change since last tracing Confirmed by Africa Masaki,  DO, Timi Reeser [phone]) on 06/04/2013 3:28:56 AM           [place], DO 06/04/13 6606

## 2013-06-04 NOTE — Progress Notes (Signed)
Clinical Social Work Department BRIEF PSYCHOSOCIAL ASSESSMENT 06/04/2013  Patient:  Erika Conley, Erika Conley     Account Number:  [id number]     Admit date:  06/04/2013  Clinical Social Worker:  Earlie Server  Date/Time:  06/04/2013 11:45 AM  Referred by:  Physician  Date Referred:  06/04/2013 Referred for  ALF Placement   Other Referral:   Interview type:  Patient Other interview type:    PSYCHOSOCIAL DATA Living Status:  FACILITY Admitted from facility:  [facility] Level of care:  Assisted Living Primary support name:  Erika Conley Primary support relationship to patient:  CHILD, ADULT Degree of support available:   Adequate    CURRENT CONCERNS Current Concerns  Post-Acute Placement   Other Concerns:    SOCIAL WORK ASSESSMENT / PLAN CSW received referral due to patient being admitted from a facility. CSW reviewed chart and met with patient at bedside. CSW introduced myself and explained role.    Patent reports she has been at [facility] for about 3 months but she does not like living there and prefers to return home. CSW inquired about who patient would live with and to ensure she would have proper care. Patient states that her 3 year old granddtr was recently injured so she and granddtr were going to move in with her dtr and help care for each other. Patient reports that she will continue talking with family but that she is not happy at ALF. CSW offered to call family to confirm choice but patient reports she wants to talk with them about plans first.    CSW called and spoke with Truddie Crumble at [facility] who confirms patient has been a resident for about 3 months but often argues with MD re: pain medication and feels that they are not prescribing her enough medicine. Patient requires minimal assistance with bathing and dressing and ambulates independently with a walker. Patient's walker has a seat in case she needs to rest. ALF is agreeable to accept patient back  at DC if she is agreeable.    CSW completed FL2 and placed in chart. CSW will continue to follow in order to assist with DC planning.   Assessment/plan status:  Psychosocial Support/Ongoing Assessment of Needs Other assessment/ plan:   Information/referral to community resources:   ALF vs Home    PATIENT'S/FAMILY'S RESPONSE TO PLAN OF CARE: Patient alert and oriented. Patient feels that she has lost her independence at ALF and does not like being told what to do. Patient aware that she will need assistance if she lives with her dtr but wants to talk with family about options. Patient thanked CSW for time and agreeable for CSW to continue to follow to support during hospital stay.       [place], [place] [phone]

## 2013-06-04 NOTE — Progress Notes (Signed)
INITIAL NUTRITION ASSESSMENT  DOCUMENTATION CODES Per approved criteria  -Not Applicable   INTERVENTION: -Recommend Dys3 diet downgrade d/t poor fitting dentition -Will monitor PO intake and supplement with Boost Q24H as warranted   NUTRITION DIAGNOSIS: Inadequate oral intake related to weakness/loose stools/nausea as evidenced by PO intake <75% for 2 weeks.   Goal: Pt to meet >/= 90% of their estimated nutrition needs    Monitor:  Total protein/energy intake, labs, weights, GI profile, diet texture tolerance  Reason for Assessment: MST  79 y.o. female  Admitting Dx: UTI (urinary tract infection)  ASSESSMENT: Erika Conley is a 79 y.o. female who is brought in to the ED with AMS, hallucinations, and an un witnessed fall.  -Pt reported poor PO intake for past two weeks d/t stomach virus, loose stools, back pain and general weakness -Endorsed an unintentional wt loss of 30 lbs in 3 months since being admitted to SNF in 02/2013.Attempted to contact SNF to confirm weight hx, however, was unable to get a response -Diet recall indicated pt would skip entire day's meals d/t back pain; was unable to be mobile enough to walk to dining hall. Pt noted this happened for 1-2 days in past two weeks -Prior to two week period, pt with good appetite, and was eating 3 meals/day. Drank chocolate Boost at home, but was not receiving at SNF -Had nausea this morning and had 0% PO intake. RN noted that this may have been attributed to high blood pressure. Pt reported nausea improved and had appetite for lunch Will monitor PO intake and determine if supplementation is warranted -Pt with minimal dentition d/t ill fitting dentures. Pt plans to be refitted for new dentures as old pair would slip out of mouth and result in food being stuck underneath -Would benefit from diet downgrade to Dys3 to ensure pt would be able to tolerate tough proteins   Height: Ht Readings from Last 1 Encounters:  07/20/10  5' 0.5" (1.537 m)    Weight: Wt Readings from Last 1 Encounters:  06/04/13 134 lb 6.4 oz (60.963 kg)    Ideal Body Weight: 100 lbs  % Ideal Body Weight: 134%  Wt Readings from Last 10 Encounters:  06/04/13 134 lb 6.4 oz (60.963 kg)  07/20/10 170 lb (77.111 kg)    Usual Body Weight: 170 lbs  % Usual Body Weight: 79%  BMI:  Body mass index is 25.81 kg/(m^2).  Estimated Nutritional Needs: Kcal: 1500-1700 Protein: 60-70 gram Fluid: >/=1700 ml/daily  Skin: WDL  Diet Order: Cardiac  EDUCATION NEEDS: -No education needs identified at this time   Intake/Output Summary (Last 24 hours) at 06/04/13 1052 Last data filed at 06/04/13 0420  Gross per 24 hour  Intake   1000 ml  Output      0 ml  Net   1000 ml    Last BM: 4/13   Labs:   Recent Labs Lab 06/04/13 0217  NA 140  K 3.5*  CL 100  CO2 20  BUN 20  CREATININE 0.67  CALCIUM 10.5  GLUCOSE 142*    CBG (last 3)  No results found for this basename: GLUCAP,  in the last 72 hours  Scheduled Meds: . busPIRone  10 mg Oral BID  . [START ON 06/05/2013] cefTRIAXone (ROCEPHIN)  IV  1 g Intravenous Q24H  . gabapentin  300 mg Oral QHS  . heparin  5,000 Units Subcutaneous 3 times per day  . ketotifen  1 drop Both Eyes BID  . levothyroxine  112 mcg Oral QAC breakfast  . pantoprazole  40 mg Oral Daily  . venlafaxine  100 mg Oral TID    Continuous Infusions: . sodium chloride 75 mL/hr at 06/04/13 0559    Past Medical History  Diagnosis Date  . Falls frequently   . Arthritis   . Hypertension   . Fracture, clavicle   . Thyroid disease   . History of fractured vertebra   . Wrist fracture, right     Past Surgical History  Procedure Laterality Date  . Thyroidectomy    . Abdominal hysterectomy    . Back surgery    . Cholecystectomy    . Wrist surgery    . Fracture surgery      Atlee Abide Marion LDN Clinical Dietitian [phone]

## 2013-06-04 NOTE — Progress Notes (Signed)
[facility] PROGRESS NOTE  SYLVIA HELMS WFU:[id number] DOB: 09-05-1934 DOA: 06/04/2013 PCP: Leonard Downing, MD  Assessment/Plan: Principal Problem:  UTI (urinary tract infection)  Active Problems:  Altered mental status  79 y.o. female with PMH of HTN, hypothyroidism, neuropathy is brought in to the ED with AMS, hallucinations, and an un witnessed fall found to have UTI   1. UTI, started on atx, pend cultures  2. Confusion, acute encephalopathy likely due to UTI, neuro exam non focal; CT head: no acute findings; -mental status is improved; change robaxin, tramadol to PRN (? Contributing to confusion); cont atx, monitor   3. Hypothyroidism, cont levothyroxine, check TSH   4. HTN uncontrolled; resume home meds; titrate per response      5. Fall likely related to UTI; neuro exam no focal; x ray no acute fractures, DJD  -obtain PT/OT;      Code Status: full Family Communication: d/w patient, called updated Roark,[name] [phone]  (indicate person spoken with, relationship, and if by phone, the number) Disposition Plan: home pend clinical improvement    Consultants:  None   Procedures:  None   Antibiotics:  Ceftriaxone 4/15<<<<<   (indicate start date, and stop date if known)  HPI/Subjective: alert  Objective: Filed Vitals:   06/04/13 0830  BP: 168/79  Pulse: 94  Temp:   Resp:     Intake/Output Summary (Last 24 hours) at 06/04/13 0902 Last data filed at 06/04/13 0420  Gross per 24 hour  Intake   1000 ml  Output      0 ml  Net   1000 ml   Filed Weights   06/04/13 0611  Weight: 60.963 kg (134 lb 6.4 oz)    Exam:   General:  alert  Cardiovascular: s1,s2 rrr  Respiratory: CTA BL  Abdomen: soft,nt,nd   Musculoskeletal: no LE edema   Data Reviewed: Basic Metabolic Panel:  Recent Labs Lab 06/04/13 0217  NA 140  K 3.5*  CL 100  CO2 20  GLUCOSE 142*  BUN 20  CREATININE 0.67  CALCIUM 10.5   Liver Function Tests: No  results found for this basename: AST, ALT, ALKPHOS, BILITOT, PROT, ALBUMIN,  in the last 168 hours No results found for this basename: LIPASE, AMYLASE,  in the last 168 hours No results found for this basename: AMMONIA,  in the last 168 hours CBC:  Recent Labs Lab 06/04/13 0217  WBC 14.5*  NEUTROABS 12.3*  HGB 17.9*  HCT 50.4*  MCV 85.7  PLT 280   Cardiac Enzymes: No results found for this basename: CKTOTAL, CKMB, CKMBINDEX, TROPONINI,  in the last 168 hours BNP (last 3 results) No results found for this basename: PROBNP,  in the last 8760 hours CBG: No results found for this basename: GLUCAP,  in the last 168 hours  No results found for this or any previous visit (from the past 240 hour(s)).   Studies: Dg Hip Complete Left  06/04/2013   CLINICAL DATA:  Fall.  EXAM: LEFT HIP - COMPLETE 2+ VIEW  COMPARISON:  None available for comparison at time of study interpretation.  FINDINGS: Slight sclerotic line at the left femoral head neck junction, no dislocation. Moderate to severe left and moderate right femoral osteoarthropathy with femoral head spurring on the left. No destructive bony lesions. Periarticular soft tissue planes are nonsuspicious.  IMPRESSION: Sclerotic line at the left femoral head neck junction, which could reflect mild nondisplaced impacted fracture. No dislocation. Moderate to severe left hip osteoarthrosis.   Electronically  Signed   By: Elon Alas   On: 06/04/2013 03:03   Ct Head Wo Contrast  06/04/2013   CLINICAL DATA:  Altered mental status  EXAM: CT HEAD WITHOUT CONTRAST  CT CERVICAL SPINE WITHOUT CONTRAST  TECHNIQUE: Multidetector CT imaging of the head and cervical spine was performed following the standard protocol without intravenous contrast. Multiplanar CT image reconstructions of the cervical spine were also generated.  COMPARISON:  None.  FINDINGS: CT HEAD FINDINGS  There is no evidence of mass effect, midline shift, or extra-axial fluid collections.  There is no evidence of a space-occupying lesion or intracranial hemorrhage. There is no evidence of a cortical-based area of acute infarction. There is generalized cerebral atrophy. There is periventricular white matter low attenuation likely secondary to microangiopathy.  The ventricles and sulci are appropriate for the patient's age. The basal cisterns are patent.  Visualized portions of the orbits are unremarkable. The visualized portions of the paranasal sinuses and mastoid air cells are unremarkable. Cerebrovascular atherosclerotic calcifications are noted.  The osseous structures are unremarkable.  CT CERVICAL SPINE FINDINGS  The alignment is anatomic. The vertebral body heights are maintained. There is no acute fracture. There is no static listhesis. The prevertebral soft tissues are normal. The intraspinal soft tissues are not fully imaged on this examination due to poor soft tissue contrast, but there is no gross soft tissue abnormality.  There is degenerative disc disease most significant at C5-C6 with disc space narrowing and discogenic endplate osteophytes. There is a broad-based disc osteophyte complex at C5-6 with mild impression on the ventral thecal sac. There is bilateral facet arthropathy throughout the cervical spine. There is bilateral foraminal stenosis at C5-6. There is left foraminal stenosis at C3-4.  The visualized portions of the lung apices demonstrate no focal abnormality.  IMPRESSION: 1. No acute intracranial pathology. 2. No acute osseous injuries cervical spine. 3. Cervical spine spondylosis as described above.   Electronically Signed   By: Kathreen Devoid   On: 06/04/2013 02:53   Ct Cervical Spine Wo Contrast  06/04/2013   CLINICAL DATA:  Altered mental status  EXAM: CT HEAD WITHOUT CONTRAST  CT CERVICAL SPINE WITHOUT CONTRAST  TECHNIQUE: Multidetector CT imaging of the head and cervical spine was performed following the standard protocol without intravenous contrast. Multiplanar CT  image reconstructions of the cervical spine were also generated.  COMPARISON:  None.  FINDINGS: CT HEAD FINDINGS  There is no evidence of mass effect, midline shift, or extra-axial fluid collections. There is no evidence of a space-occupying lesion or intracranial hemorrhage. There is no evidence of a cortical-based area of acute infarction. There is generalized cerebral atrophy. There is periventricular white matter low attenuation likely secondary to microangiopathy.  The ventricles and sulci are appropriate for the patient's age. The basal cisterns are patent.  Visualized portions of the orbits are unremarkable. The visualized portions of the paranasal sinuses and mastoid air cells are unremarkable. Cerebrovascular atherosclerotic calcifications are noted.  The osseous structures are unremarkable.  CT CERVICAL SPINE FINDINGS  The alignment is anatomic. The vertebral body heights are maintained. There is no acute fracture. There is no static listhesis. The prevertebral soft tissues are normal. The intraspinal soft tissues are not fully imaged on this examination due to poor soft tissue contrast, but there is no gross soft tissue abnormality.  There is degenerative disc disease most significant at C5-C6 with disc space narrowing and discogenic endplate osteophytes. There is a broad-based disc osteophyte complex at C5-6 with mild  impression on the ventral thecal sac. There is bilateral facet arthropathy throughout the cervical spine. There is bilateral foraminal stenosis at C5-6. There is left foraminal stenosis at C3-4.  The visualized portions of the lung apices demonstrate no focal abnormality.  IMPRESSION: 1. No acute intracranial pathology. 2. No acute osseous injuries cervical spine. 3. Cervical spine spondylosis as described above.   Electronically Signed   By: Kathreen Devoid   On: 06/04/2013 02:53   Ct Hip Left Wo Contrast  06/04/2013   CLINICAL DATA:  Left hip pain  EXAM: CT OF THE LEFT HIP WITHOUT  CONTRAST  TECHNIQUE: Multidetector CT imaging was performed according to the standard protocol. Multiplanar CT image reconstructions were also generated.  COMPARISON:  None.  FINDINGS: There is severe osteoarthritis of the left hip. There is no acute fracture or dislocation. The left superior and inferior pubic rami are intact. There is no fluid collection or hematoma. There is soft tissue contusion in the upper medial buttock.  IMPRESSION: 1. No acute osseous injury of the left hip. 2. Severe osteoarthritis of the left hip.   Electronically Signed   By: Kathreen Devoid   On: 06/04/2013 03:52    Scheduled Meds: . busPIRone  10 mg Oral BID  . [START ON 06/05/2013] cefTRIAXone (ROCEPHIN)  IV  1 g Intravenous Q24H  . gabapentin  300 mg Oral QHS  . heparin  5,000 Units Subcutaneous 3 times per day  . ketotifen  1 drop Both Eyes BID  . levothyroxine  112 mcg Oral QAC breakfast  . methocarbamol  1,000 mg Oral TID  . pantoprazole  40 mg Oral Daily  . traMADol  50 mg Oral TID AC & HS  . venlafaxine  100 mg Oral TID   Continuous Infusions: . sodium chloride 75 mL/hr at 06/04/13 0559    Principal Problem:   UTI (urinary tract infection) Active Problems:   Altered mental status    Time spent: >35 minutes     Kinnie Feil  [facility] Pager [phone]. If 7PM-7AM, please contact night-coverage at [email or web address], password [facility] 06/04/2013, 9:02 AM  LOS: 0 days

## 2013-06-04 NOTE — ED Notes (Signed)
Bed: WA09 Expected date:  Expected time:  Means of arrival:  Comments: EMS 78yo F hallucinations

## 2013-06-04 NOTE — ED Notes (Signed)
Brought in by EMS from [facility] with c/o altered mental status.  Per EMS, staff at the facility found pt lying on floor--- pt denied "falling on floor"; pt was observed verbalizations of "delusions"--- she was stating that 2 helicopters were on their way to pick her up to "[facility] for her back pain.

## 2013-06-05 DIAGNOSIS — I10 Essential (primary) hypertension: Secondary | ICD-10-CM

## 2013-06-05 LAB — URINE CULTURE

## 2013-06-05 MED ORDER — HYDRALAZINE HCL 20 MG/ML IJ SOLN
10.0000 mg | Freq: Three times a day (TID) | INTRAMUSCULAR | Status: DC | PRN
  Filled 2013-06-05: qty 0.5

## 2013-06-05 MED ORDER — HALOPERIDOL 1 MG PO TABS
1.0000 mg | ORAL_TABLET | Freq: Once | ORAL | Status: AC
  Administered 2013-06-05: 1 mg via ORAL
  Filled 2013-06-05: qty 1

## 2013-06-05 MED ORDER — AMLODIPINE BESYLATE 5 MG PO TABS
5.0000 mg | ORAL_TABLET | Freq: Every day | ORAL | Status: DC
  Administered 2013-06-05 – 2013-06-06 (×2): 5 mg via ORAL
  Filled 2013-06-05 (×2): qty 1

## 2013-06-05 MED ORDER — HALOPERIDOL 0.5 MG PO TABS
0.5000 mg | ORAL_TABLET | Freq: Three times a day (TID) | ORAL | Status: DC | PRN
  Administered 2013-06-05: 0.5 mg via ORAL
  Filled 2013-06-05: qty 1

## 2013-06-05 NOTE — Progress Notes (Signed)
PT Cancellation/sign off  Note  Patient Details Name: Erika Conley MRN: [id number] DOB: Aug 25, 1934   Cancelled Treatment:    Reason Eval/Treat Not Completed: Other--pt  refuses to participate with therapy today. Prefers for her husband to be present--states she plans to d/c home with husband. Pt then stated she spoke with MD and requested for him to NOT put an order in for therapy eval.  Pt refuses PT services-does not want to have therapy at all. PT will sign off at this time. Thanks.    Weston Anna, MPT Pager: [phone]

## 2013-06-05 NOTE — Progress Notes (Addendum)
[facility] PROGRESS NOTE  Erika Conley QHU:[id number] DOB: 1934-12-16 DOA: 06/04/2013 PCP: Leonard Downing, MD  Assessment/Plan: Principal Problem:  UTI (urinary tract infection)  Active Problems:  Altered mental status  79 y.o. female with PMH of HTN, hypothyroidism, neuropathy is brought in to the ED with AMS, hallucinations, and an un witnessed fall found to have UTI   1. UTI, cont on atx, pend cultures  2. Confusion, acute encephalopathy likely due to UTI, neuro exam non focal; CT head: no acute findings; -mental status is improved; change robaxin, tramadol to PRN (? Contributing to confusion);  -for agitation, no Benzo may worsen delirium; may use haldol low dose prn; cont atx, monitor   3. Hypothyroidism, cont levothyroxine, TSH wnl  4. HTN uncontrolled; not on home meds; started amlodipine; titrate per response      5. Fall likely related to UTI; neuro exam no focal; x ray no acute fractures, DJD  -obtain PT/OT;      Code Status: full Family Communication: d/w patient, called updated [name],[name] [phone]  (indicate person spoken with, relationship, and if by phone, the number) Disposition Plan: home pend clinical improvement, PT eval    Consultants:  None   Procedures:  None   Antibiotics:  Ceftriaxone 4/15<<<<<   (indicate start date, and stop date if known)  HPI/Subjective: alert  Objective: Filed Vitals:   06/05/13 0830  BP: 175/82  Pulse: 72  Temp: 98 F (36.7 C)  Resp:     Intake/Output Summary (Last 24 hours) at 06/05/13 0921 Last data filed at 06/05/13 0748  Gross per 24 hour  Intake 2216.25 ml  Output    650 ml  Net 1566.25 ml   Filed Weights   06/04/13 0611  Weight: 60.963 kg (134 lb 6.4 oz)    Exam:   General:  alert  Cardiovascular: s1,s2 rrr  Respiratory: CTA BL  Abdomen: soft,nt,nd   Musculoskeletal: no LE edema   Data Reviewed: Basic Metabolic Panel:  Recent Labs Lab 06/04/13 0217  NA  140  K 3.5*  CL 100  CO2 20  GLUCOSE 142*  BUN 20  CREATININE 0.67  CALCIUM 10.5   Liver Function Tests: No results found for this basename: AST, ALT, ALKPHOS, BILITOT, PROT, ALBUMIN,  in the last 168 hours No results found for this basename: LIPASE, AMYLASE,  in the last 168 hours No results found for this basename: AMMONIA,  in the last 168 hours CBC:  Recent Labs Lab 06/04/13 0217  WBC 14.5*  NEUTROABS 12.3*  HGB 17.9*  HCT 50.4*  MCV 85.7  PLT 280   Cardiac Enzymes: No results found for this basename: CKTOTAL, CKMB, CKMBINDEX, TROPONINI,  in the last 168 hours BNP (last 3 results) No results found for this basename: PROBNP,  in the last 8760 hours CBG: No results found for this basename: GLUCAP,  in the last 168 hours  No results found for this or any previous visit (from the past 240 hour(s)).   Studies: Dg Hip Complete Left  06/04/2013   CLINICAL DATA:  Fall.  EXAM: LEFT HIP - COMPLETE 2+ VIEW  COMPARISON:  None available for comparison at time of study interpretation.  FINDINGS: Slight sclerotic line at the left femoral head neck junction, no dislocation. Moderate to severe left and moderate right femoral osteoarthropathy with femoral head spurring on the left. No destructive bony lesions. Periarticular soft tissue planes are nonsuspicious.  IMPRESSION: Sclerotic line at the left femoral head neck junction, which could reflect  mild nondisplaced impacted fracture. No dislocation. Moderate to severe left hip osteoarthrosis.   Electronically Signed   By: Elon Alas   On: 06/04/2013 03:03   Ct Head Wo Contrast  06/04/2013   CLINICAL DATA:  Altered mental status  EXAM: CT HEAD WITHOUT CONTRAST  CT CERVICAL SPINE WITHOUT CONTRAST  TECHNIQUE: Multidetector CT imaging of the head and cervical spine was performed following the standard protocol without intravenous contrast. Multiplanar CT image reconstructions of the cervical spine were also generated.  COMPARISON:  None.   FINDINGS: CT HEAD FINDINGS  There is no evidence of mass effect, midline shift, or extra-axial fluid collections. There is no evidence of a space-occupying lesion or intracranial hemorrhage. There is no evidence of a cortical-based area of acute infarction. There is generalized cerebral atrophy. There is periventricular white matter low attenuation likely secondary to microangiopathy.  The ventricles and sulci are appropriate for the patient's age. The basal cisterns are patent.  Visualized portions of the orbits are unremarkable. The visualized portions of the paranasal sinuses and mastoid air cells are unremarkable. Cerebrovascular atherosclerotic calcifications are noted.  The osseous structures are unremarkable.  CT CERVICAL SPINE FINDINGS  The alignment is anatomic. The vertebral body heights are maintained. There is no acute fracture. There is no static listhesis. The prevertebral soft tissues are normal. The intraspinal soft tissues are not fully imaged on this examination due to poor soft tissue contrast, but there is no gross soft tissue abnormality.  There is degenerative disc disease most significant at C5-C6 with disc space narrowing and discogenic endplate osteophytes. There is a broad-based disc osteophyte complex at C5-6 with mild impression on the ventral thecal sac. There is bilateral facet arthropathy throughout the cervical spine. There is bilateral foraminal stenosis at C5-6. There is left foraminal stenosis at C3-4.  The visualized portions of the lung apices demonstrate no focal abnormality.  IMPRESSION: 1. No acute intracranial pathology. 2. No acute osseous injuries cervical spine. 3. Cervical spine spondylosis as described above.   Electronically Signed   By: Kathreen Devoid   On: 06/04/2013 02:53   Ct Cervical Spine Wo Contrast  06/04/2013   CLINICAL DATA:  Altered mental status  EXAM: CT HEAD WITHOUT CONTRAST  CT CERVICAL SPINE WITHOUT CONTRAST  TECHNIQUE: Multidetector CT imaging of the  head and cervical spine was performed following the standard protocol without intravenous contrast. Multiplanar CT image reconstructions of the cervical spine were also generated.  COMPARISON:  None.  FINDINGS: CT HEAD FINDINGS  There is no evidence of mass effect, midline shift, or extra-axial fluid collections. There is no evidence of a space-occupying lesion or intracranial hemorrhage. There is no evidence of a cortical-based area of acute infarction. There is generalized cerebral atrophy. There is periventricular white matter low attenuation likely secondary to microangiopathy.  The ventricles and sulci are appropriate for the patient's age. The basal cisterns are patent.  Visualized portions of the orbits are unremarkable. The visualized portions of the paranasal sinuses and mastoid air cells are unremarkable. Cerebrovascular atherosclerotic calcifications are noted.  The osseous structures are unremarkable.  CT CERVICAL SPINE FINDINGS  The alignment is anatomic. The vertebral body heights are maintained. There is no acute fracture. There is no static listhesis. The prevertebral soft tissues are normal. The intraspinal soft tissues are not fully imaged on this examination due to poor soft tissue contrast, but there is no gross soft tissue abnormality.  There is degenerative disc disease most significant at C5-C6 with disc space narrowing  and discogenic endplate osteophytes. There is a broad-based disc osteophyte complex at C5-6 with mild impression on the ventral thecal sac. There is bilateral facet arthropathy throughout the cervical spine. There is bilateral foraminal stenosis at C5-6. There is left foraminal stenosis at C3-4.  The visualized portions of the lung apices demonstrate no focal abnormality.  IMPRESSION: 1. No acute intracranial pathology. 2. No acute osseous injuries cervical spine. 3. Cervical spine spondylosis as described above.   Electronically Signed   By: Kathreen Devoid   On: 06/04/2013 02:53    Ct Hip Left Wo Contrast  06/04/2013   CLINICAL DATA:  Left hip pain  EXAM: CT OF THE LEFT HIP WITHOUT CONTRAST  TECHNIQUE: Multidetector CT imaging was performed according to the standard protocol. Multiplanar CT image reconstructions were also generated.  COMPARISON:  None.  FINDINGS: There is severe osteoarthritis of the left hip. There is no acute fracture or dislocation. The left superior and inferior pubic rami are intact. There is no fluid collection or hematoma. There is soft tissue contusion in the upper medial buttock.  IMPRESSION: 1. No acute osseous injury of the left hip. 2. Severe osteoarthritis of the left hip.   Electronically Signed   By: Kathreen Devoid   On: 06/04/2013 03:52    Scheduled Meds: . busPIRone  10 mg Oral BID  . cefTRIAXone (ROCEPHIN)  IV  1 g Intravenous Q24H  . gabapentin  300 mg Oral QHS  . heparin  5,000 Units Subcutaneous 3 times per day  . ketotifen  1 drop Both Eyes BID  . levothyroxine  112 mcg Oral QAC breakfast  . pantoprazole  40 mg Oral Daily  . venlafaxine  100 mg Oral TID   Continuous Infusions: . sodium chloride 75 mL/hr at 06/05/13 0757    Principal Problem:   UTI (urinary tract infection) Active Problems:   Altered mental status    Time spent: >35 minutes     Kinnie Feil  [facility] Pager [phone]. If 7PM-7AM, please contact night-coverage at [email or web address], password [facility] 06/05/2013, 9:21 AM  LOS: 1 day

## 2013-06-06 LAB — CBC
HCT: 45.2 % (ref 36.0–46.0)
Hemoglobin: 15.4 g/dL — ABNORMAL HIGH (ref 12.0–15.0)
MCH: 29.5 pg (ref 26.0–34.0)
MCHC: 34.1 g/dL (ref 30.0–36.0)
MCV: 86.6 fL (ref 78.0–100.0)
PLATELETS: 243 10*3/uL (ref 150–400)
RBC: 5.22 MIL/uL — ABNORMAL HIGH (ref 3.87–5.11)
RDW: 13.2 % (ref 11.5–15.5)
WBC: 7.4 10*3/uL (ref 4.0–10.5)

## 2013-06-06 MED ORDER — METHOCARBAMOL 500 MG PO TABS: 1000.0000 mg | ORAL_TABLET | Freq: Three times a day (TID) | ORAL | Status: DC | PRN

## 2013-06-06 MED ORDER — AMLODIPINE BESYLATE 5 MG PO TABS: 5.0000 mg | ORAL_TABLET | Freq: Every day | ORAL | Status: DC

## 2013-06-06 MED ORDER — AMOXICILLIN-POT CLAVULANATE 875-125 MG PO TABS: 1.0000 | ORAL_TABLET | Freq: Two times a day (BID) | ORAL | Status: DC

## 2013-06-06 MED ORDER — ACETAMINOPHEN 325 MG PO TABS: 650.0000 mg | ORAL_TABLET | Freq: Four times a day (QID) | ORAL | Status: DC | PRN

## 2013-06-06 MED ORDER — TRAMADOL HCL 50 MG PO TABS: 50.0000 mg | ORAL_TABLET | Freq: Two times a day (BID) | ORAL | Status: DC | PRN

## 2013-06-06 NOTE — Discharge Summary (Addendum)
Physician Discharge Summary  GEOFFREY MANKIN DJM:[id number] DOB: 1934-06-12 DOA: 06/04/2013  PCP: Leonard Downing, MD  Admit date: 06/04/2013 Discharge date: 06/06/2013  Time spent: >35 minutes  Recommendations for Outpatient Follow-up:  ALF F/u with PCP in 1-2 weeks as  needed Discharge Diagnoses:  Principal Problem:   UTI (urinary tract infection) Active Problems:   Altered mental status   Discharge Condition: stable   Diet recommendation: heart healthy   Filed Weights   06/04/13 0611  Weight: 60.963 kg (134 lb 6.4 oz)    History of present illness:  Principal Problem:  UTI (urinary tract infection)  Active Problems:  Altered mental status   Hospital Course:  79 y.o. female with PMH of HTN, hypothyroidism, neuropathy is brought in to the ED with AMS, hallucinations, and an un witnessed fall found to have UTI   1. UTI, cont on atx, improved on atx; afebrile;  changed to PO atx  2. Confusion, acute encephalopathy likely due to UTI, neuro exam non focal; CT head: no acute findings;  -resolved; change robaxin prn; d/c tramadol (? Contributing to confusion);  3. Hypothyroidism, cont levothyroxine, TSH wnl  4. HTN uncontrolled; not on home meds; started amlodipine; titrate per response  5. Fall likely related to UTI; neuro exam no focal; x ray no acute fractures, DJD  -PT revaluated did not recommend rehab;    D/w patient, confirmed with her son Latin,Ken Pandora Leiter [phone] -patient is being d/c to ALF; patient, family agreed; all questions answered    Procedures:  none (i.e. Studies not automatically included, echos, thoracentesis, etc; not x-rays)  Consultations:  noen  Discharge Exam: Filed Vitals:   06/06/13 1400  BP: 154/87  Pulse: 71  Temp: 98.3 F (36.8 C)  Resp: 18    General: alert Cardiovascular: s1,s2 rrr Respiratory: CTA BL  Discharge Instructions  Discharge Orders   Future Orders Complete By Expires   Diet - low sodium heart healthy   As directed    Discharge instructions  As directed    Increase activity slowly  As directed        Medication List    STOP taking these medications       traMADol 50 MG tablet  Commonly known as:  ULTRAM      TAKE these medications       acetaminophen 325 MG tablet  Commonly known as:  TYLENOL  Take 2 tablets (650 mg total) by mouth every 6 (six) hours as needed for mild pain.     amLODipine 5 MG tablet  Commonly known as:  NORVASC  Take 1 tablet (5 mg total) by mouth daily.     amoxicillin-clavulanate 875-125 MG per tablet  Commonly known as:  AUGMENTIN  Take 1 tablet by mouth 2 (two) times daily.     BUSPAR PO  Take 10 mg by mouth 2 (two) times daily.     calcium carbonate 500 MG chewable tablet  Commonly known as:  TUMS - dosed in mg elemental calcium  Chew 1 tablet by mouth 3 (three) times daily as needed for indigestion or heartburn.     gabapentin 300 MG capsule  Commonly known as:  NEURONTIN  Take 300 mg by mouth at bedtime.     ketotifen 0.025 % ophthalmic solution  Commonly known as:  ZADITOR  Place 1 drop into both eyes 2 (two) times daily.     levothyroxine 112 MCG tablet  Commonly known as:  SYNTHROID, LEVOTHROID  Take 112 mcg by mouth  daily before breakfast.     loperamide 2 MG capsule  Commonly known as:  IMODIUM  Take 2 mg by mouth as needed for diarrhea or loose stools.     methocarbamol 500 MG tablet  Commonly known as:  ROBAXIN  Take 2 tablets (1,000 mg total) by mouth every 8 (eight) hours as needed for muscle spasms.     pantoprazole 40 MG tablet  Commonly known as:  PROTONIX  Take 40 mg by mouth daily.     traZODone 100 MG tablet  Commonly known as:  DESYREL  Take 100 mg by mouth at bedtime as needed for sleep.     venlafaxine 100 MG tablet  Commonly known as:  EFFEXOR  Take 100 mg by mouth 3 (three) times daily.     Vitamin D (Ergocalciferol) 50000 UNITS Caps capsule  Commonly known as:  DRISDOL  Take 50,000 Units by mouth  every 14 (fourteen) days.       Allergies  Allergen Reactions  . Codeine     Pass out, fall  . Cortisone     Pass out, fall   . Other     All narcotics cause patient to Pass out, fall        Follow-up Information   Follow up with Leonard Downing, MD In 1 week.   Specialty:  Family Medicine   Contact information:   [place] [address] [phone]        The results of significant diagnostics from this hospitalization (including imaging, microbiology, ancillary and laboratory) are listed below for reference.    Significant Diagnostic Studies: Dg Hip Complete Left  06/04/2013   CLINICAL DATA:  Fall.  EXAM: LEFT HIP - COMPLETE 2+ VIEW  COMPARISON:  None available for comparison at time of study interpretation.  FINDINGS: Slight sclerotic line at the left femoral head neck junction, no dislocation. Moderate to severe left and moderate right femoral osteoarthropathy with femoral head spurring on the left. No destructive bony lesions. Periarticular soft tissue planes are nonsuspicious.  IMPRESSION: Sclerotic line at the left femoral head neck junction, which could reflect mild nondisplaced impacted fracture. No dislocation. Moderate to severe left hip osteoarthrosis.   Electronically Signed   By: Elon Alas   On: 06/04/2013 03:03   Ct Head Wo Contrast  06/04/2013   CLINICAL DATA:  Altered mental status  EXAM: CT HEAD WITHOUT CONTRAST  CT CERVICAL SPINE WITHOUT CONTRAST  TECHNIQUE: Multidetector CT imaging of the head and cervical spine was performed following the standard protocol without intravenous contrast. Multiplanar CT image reconstructions of the cervical spine were also generated.  COMPARISON:  None.  FINDINGS: CT HEAD FINDINGS  There is no evidence of mass effect, midline shift, or extra-axial fluid collections. There is no evidence of a space-occupying lesion or intracranial hemorrhage. There is no evidence of a cortical-based area of acute  infarction. There is generalized cerebral atrophy. There is periventricular white matter low attenuation likely secondary to microangiopathy.  The ventricles and sulci are appropriate for the patient's age. The basal cisterns are patent.  Visualized portions of the orbits are unremarkable. The visualized portions of the paranasal sinuses and mastoid air cells are unremarkable. Cerebrovascular atherosclerotic calcifications are noted.  The osseous structures are unremarkable.  CT CERVICAL SPINE FINDINGS  The alignment is anatomic. The vertebral body heights are maintained. There is no acute fracture. There is no static listhesis. The prevertebral soft tissues are normal. The intraspinal soft tissues are not fully  imaged on this examination due to poor soft tissue contrast, but there is no gross soft tissue abnormality.  There is degenerative disc disease most significant at C5-C6 with disc space narrowing and discogenic endplate osteophytes. There is a broad-based disc osteophyte complex at C5-6 with mild impression on the ventral thecal sac. There is bilateral facet arthropathy throughout the cervical spine. There is bilateral foraminal stenosis at C5-6. There is left foraminal stenosis at C3-4.  The visualized portions of the lung apices demonstrate no focal abnormality.  IMPRESSION: 1. No acute intracranial pathology. 2. No acute osseous injuries cervical spine. 3. Cervical spine spondylosis as described above.   Electronically Signed   By: Kathreen Devoid   On: 06/04/2013 02:53   Ct Cervical Spine Wo Contrast  06/04/2013   CLINICAL DATA:  Altered mental status  EXAM: CT HEAD WITHOUT CONTRAST  CT CERVICAL SPINE WITHOUT CONTRAST  TECHNIQUE: Multidetector CT imaging of the head and cervical spine was performed following the standard protocol without intravenous contrast. Multiplanar CT image reconstructions of the cervical spine were also generated.  COMPARISON:  None.  FINDINGS: CT HEAD FINDINGS  There is no  evidence of mass effect, midline shift, or extra-axial fluid collections. There is no evidence of a space-occupying lesion or intracranial hemorrhage. There is no evidence of a cortical-based area of acute infarction. There is generalized cerebral atrophy. There is periventricular white matter low attenuation likely secondary to microangiopathy.  The ventricles and sulci are appropriate for the patient's age. The basal cisterns are patent.  Visualized portions of the orbits are unremarkable. The visualized portions of the paranasal sinuses and mastoid air cells are unremarkable. Cerebrovascular atherosclerotic calcifications are noted.  The osseous structures are unremarkable.  CT CERVICAL SPINE FINDINGS  The alignment is anatomic. The vertebral body heights are maintained. There is no acute fracture. There is no static listhesis. The prevertebral soft tissues are normal. The intraspinal soft tissues are not fully imaged on this examination due to poor soft tissue contrast, but there is no gross soft tissue abnormality.  There is degenerative disc disease most significant at C5-C6 with disc space narrowing and discogenic endplate osteophytes. There is a broad-based disc osteophyte complex at C5-6 with mild impression on the ventral thecal sac. There is bilateral facet arthropathy throughout the cervical spine. There is bilateral foraminal stenosis at C5-6. There is left foraminal stenosis at C3-4.  The visualized portions of the lung apices demonstrate no focal abnormality.  IMPRESSION: 1. No acute intracranial pathology. 2. No acute osseous injuries cervical spine. 3. Cervical spine spondylosis as described above.   Electronically Signed   By: Kathreen Devoid   On: 06/04/2013 02:53   Ct Hip Left Wo Contrast  06/04/2013   CLINICAL DATA:  Left hip pain  EXAM: CT OF THE LEFT HIP WITHOUT CONTRAST  TECHNIQUE: Multidetector CT imaging was performed according to the standard protocol. Multiplanar CT image reconstructions  were also generated.  COMPARISON:  None.  FINDINGS: There is severe osteoarthritis of the left hip. There is no acute fracture or dislocation. The left superior and inferior pubic rami are intact. There is no fluid collection or hematoma. There is soft tissue contusion in the upper medial buttock.  IMPRESSION: 1. No acute osseous injury of the left hip. 2. Severe osteoarthritis of the left hip.   Electronically Signed   By: Kathreen Devoid   On: 06/04/2013 03:52    Microbiology: Recent Results (from the past 240 hour(s))  URINE CULTURE  Status: None   Collection Time    06/04/13  3:04 AM      Result Value Ref Range Status   Specimen Description URINE, CLEAN CATCH   Final   Special Requests NONE   Final   Culture  Setup Time     Final   Value: 06/04/2013 08:44     Performed at [place]     Final   Value: 20,OOO COLONIES/ML     Performed at [facility]   Culture     Final   Value: Multiple bacterial morphotypes present, none predominant. Suggest appropriate recollection if clinically indicated.     Performed at [facility]   Report Status 06/05/2013 FINAL   Final     Labs: Basic Metabolic Panel:  Recent Labs Lab 06/04/13 0217  NA 140  K 3.5*  CL 100  CO2 20  GLUCOSE 142*  BUN 20  CREATININE 0.67  CALCIUM 10.5   Liver Function Tests: No results found for this basename: AST, ALT, ALKPHOS, BILITOT, PROT, ALBUMIN,  in the last 168 hours No results found for this basename: LIPASE, AMYLASE,  in the last 168 hours No results found for this basename: AMMONIA,  in the last 168 hours CBC:  Recent Labs Lab 06/04/13 0217 06/06/13 0443  WBC 14.5* 7.4  NEUTROABS 12.3*  --   HGB 17.9* 15.4*  HCT 50.4* 45.2  MCV 85.7 86.6  PLT 280 243   Cardiac Enzymes: No results found for this basename: CKTOTAL, CKMB, CKMBINDEX, TROPONINI,  in the last 168 hours BNP: BNP (last 3 results) No results found for this basename: PROBNP,  in the last  8760 hours CBG: No results found for this basename: GLUCAP,  in the last 168 hours     Signed:  Kinnie Feil  [facility] 06/06/2013, 2:23 PM

## 2013-06-06 NOTE — Evaluation (Signed)
Physical Therapy Evaluation Patient Details Name: Erika Conley MRN: [id number] DOB: 12/20/1934 Today's Date: 06/06/2013   History of Present Illness  Erika Conley is a 79 y.o. female who is brought in to the ED with AMS, hallucinations, and an un witnessed fall.  Per nursing home patient has been refusing medications for the past 2 days.  At baseline she is able to answer questions normally and follow commands.  Today they state she was talking about people who had died, fire trucks being in the room, and being flown to [facility] in a helicopter for her broken back and arm.  They found her lying on the bathroom floor, with a pillow and a blanket, would not get up off of floor or follow commands.  Unclear wether patient fell or more likely given her current mental status just decided to take a nap on the bathroom floor.  Thankfully no apparent injury on work up in ED.  Clinical Impression  *.Pt is modified independent with mobility. She walked 61' with RW without loss of balance. No further PT indicated. **    Follow Up Recommendations No PT follow up    Equipment Recommendations  None recommended by PT    Recommendations for Other Services       Precautions / Restrictions Precautions Precautions: None Restrictions Weight Bearing Restrictions: No      Mobility  Bed Mobility Overal bed mobility: Independent                Transfers Overall transfer level: Modified independent Equipment used: Rolling walker (2 wheeled)                Ambulation/Gait Ambulation/Gait assistance: Modified independent (Device/Increase time) Ambulation Distance (Feet): 240 Feet Assistive device: Rolling walker (2 wheeled) Gait Pattern/deviations: WFL(Within Functional Limits)   Gait velocity interpretation: at or above normal speed for age/gender General Gait Details: steady, no LOB  Stairs            Wheelchair Mobility    Modified Rankin (Stroke  Patients Only)       Balance Overall balance assessment: No apparent balance deficits (not formally assessed)                                           Pertinent Vitals/Pain *0/10 pain**    Home Living Family/patient expects to be discharged to:: Unsure                 Additional Comments: pt is stating that she is not planning to return to ALF. she is planning to d/c home with family, though no-one has agreed to take her in yet.       Prior Function Level of Independence: Independent with assistive device(s)         Comments: walked to dining room with rollator independently at ALF     Hand Dominance        Extremity/Trunk Assessment   Upper Extremity Assessment: Overall WFL for tasks assessed           Lower Extremity Assessment: Overall WFL for tasks assessed      Cervical / Trunk Assessment: Normal  Communication   Communication: No difficulties  Cognition Arousal/Alertness: Awake/alert Behavior During Therapy: WFL for tasks assessed/performed Overall Cognitive Status: Within Functional Limits for tasks assessed  General Comments      Exercises        Assessment/Plan    PT Assessment Patent does not need any further PT services  PT Diagnosis     PT Problem List    PT Treatment Interventions     PT Goals (Current goals can be found in the Care Plan section) Acute Rehab PT Goals Patient Stated Goal: to DC to a family member's home PT Goal Formulation: No goals set, d/c therapy    Frequency     Barriers to discharge        Co-evaluation               End of Session Equipment Utilized During Treatment: Gait belt Activity Tolerance: Patient tolerated treatment well Patient left: in bed;with call bell/phone within reach Nurse Communication: Mobility status         Time: 1213-1231 PT Time Calculation (min): 18 min   Charges:   PT Evaluation $Initial PT Evaluation Tier  I: 1 Procedure PT Treatments $Gait Training: 8-22 mins   PT G CodesLucile Conley 06/06/2013, 12:56 PM [phone]

## 2013-06-06 NOTE — Progress Notes (Signed)
Clinical Social Work  CSW received a return call from patient's husband Milus Banister) who reports he is not patient's husband and she cannot live with him. CSW met with patient at bedside and explained this information who reports that she is upset because he told her he could come and she was going to call and "cuss him out." CSW explained that this does not see like a good plan and that dtr still has not returned call. Patient reports she will try and talk with son and will move in with him. CSW explained that patient and CSW had already spoken to son who reported that patient has to return to ALF. Patient upset and reports she does not want to return to ALF. CSW explained this is safest option and son feels she needs to return. Patient reports that son is only her 40 and that he cannot make decisions for her unless she is incapable of making her own decisions. Patient alert and oriented but upset about the possibility of returning to ALF.  PT to work with patient and CSW will follow up.  [place], [place] [phone]

## 2013-06-06 NOTE — Evaluation (Signed)
Occupational Therapy Evaluation Patient Details Name: Erika Conley MRN: [id number] DOB: 1934/07/18 Today's Date: 06/06/2013    History of Present Illness Pt was admitted with AMS, hallucinations and unwitnessed fall   Clinical Impression   Pt was admitted for the above.  We will follow her in acute with mod I goals.  During evaluation, pt was mostly at supervision/set up level, except for min A donning shirt.  She needed cues with RW going through tight spaces.  Pt will likely not need follow up post acute.    Follow Up Recommendations   (pt hopes to go home with family member instead of ALF.  Will not need post acute OT)    Equipment Recommendations  None recommended by OT    Recommendations for Other Services       Precautions / Restrictions Precautions Precautions: Fall Restrictions Weight Bearing Restrictions: No      Mobility Bed Mobility Overal bed mobility: Independent                Transfers Overall transfer level: Needs assistance Equipment used: Rolling walker (2 wheeled) Transfers: Sit to/from Stand Sit to Stand: Modified independent (Device/Increase time)              Balance Overall balance assessment: No apparent balance deficits (not formally assessed) but uses walker for support                                          ADL Overall ADL's : Needs assistance/impaired                         Toilet Transfer: Supervision/safety;Comfort height toilet;Ambulation           Functional mobility during ADLs: Supervision/safety General ADL Comments: Pt needed cues to sidestep through tight spaces with RW. She was able to dress herself at mostly set up level, except for shirt: she needed min A as she did not placed LUE in first.  Pt stated she was hot in gowns.  Wanted to wear our mesh underwear with her long tshirt.     Vision                     Perception     Praxis      Pertinent Vitals/Pain  No c/o pain     Hand Dominance     Extremity/Trunk Assessment Upper Extremity Assessment Upper Extremity Assessment: LUE deficits/detail LUE Deficits / Details: able to lift to 90.  Unable to functionally internal/external rotate for adls; this is non-dominant hand   Lower Extremity Assessment Lower Extremity Assessment: Overall WFL for tasks assessed   Cervical / Trunk Assessment Cervical / Trunk Assessment: Normal   Communication Communication Communication: No difficulties   Cognition Arousal/Alertness: Awake/alert Behavior During Therapy: WFL for tasks assessed/performed Overall Cognitive Status: Within Functional Limits for tasks assessed                     General Comments       Exercises       Shoulder Instructions      Home Living Family/patient expects to be discharged to:: Unsure                                 Additional Comments: pt  is stating that she is not planning to return to ALF. she is planning to d/c home with family, though no-one has agreed to take her in yet.         Prior Functioning/Environment Level of Independence: Independent with assistive device(s)        Comments: walked to dining room with rollator independently at ALF    OT Diagnosis: Generalized weakness   OT Problem List: Decreased strength;Decreased range of motion;Impaired balance (sitting and/or standing)   OT Treatment/Interventions: Self-care/ADL training;DME and/or AE instruction;Patient/family education;Balance training    OT Goals(Current goals can be found in the care plan section) Acute Rehab OT Goals Patient Stated Goal: to DC to a family member's home OT Goal Formulation: With patient Time For Goal Achievement: 06/13/13 Potential to Achieve Goals: Good ADL Goals Pt Will Transfer to Toilet: with modified independence;regular height toilet;ambulating Additional ADL Goal #1: pt will gather clothes/adl supplies at mod I level  OT Frequency:  Min 2X/week   Barriers to D/C:            Co-evaluation              End of Session    Activity Tolerance: Patient tolerated treatment well Patient left: in bed;with call bell/phone within reach   Time: 1253-1317 OT Time Calculation (min): 24 min Charges:  OT General Charges $OT Visit: 1 Procedure OT Evaluation $Initial OT Evaluation Tier I: 1 Procedure OT Treatments $Self Care/Home Management : 8-22 mins G-Codes:    Lesle Chris 18-Jun-2013, 2:32 PM  Lesle Chris, OTR/L [phone] June 18, 2013

## 2013-06-06 NOTE — Progress Notes (Signed)
Discharge instructions accompanied pt, left the unit in stable condition. Transported to nursing facility.

## 2013-06-06 NOTE — Progress Notes (Signed)
Clinical Social Work  CSW met with patient in order to confirm DC plans. Patient reports she has spoken to family who is agreeable that she will return home with them instead of going back to ALF. CSW inquired about living arrangements and patient now reports she will live with her husband and dtr. Patient states that her dtr has been having martial problems and has been living off and on with patient's husband. Patient reports that she and her husband have been dating for over 28 years but husband has wanted to date other people at times. Patient reports they have not been married long.  CSW spoke with patient about ensuring safe DC. Patient agreeable for CSW to contact husband in order to verify that patient would be returning with him. CSW left a message with husband on cell phone and home number. Patient then asked CSW to call dtr and CSW left a message on her cell phone. Patient asked CSW to call her son in order to determine when he would arrive. Son reports that he is working in [employer] and won't be home until [employer]. Son reports that he and patient discussed how she has to return to ALF because she has no other place to live at this time. Son reports that he feels it is best for patient to return to ALF.   Patient alert and oriented during assessment. Patient aware that she is in the hospital and able to report when she was admitted. Patient reports she will call family and come up with a plan at DC. CSW explained if no family able to confirm that she can live with them then CSW will assist with transfer back to ALF.  CSW will continue to follow.  [place], [place] [phone]

## 2013-06-06 NOTE — Progress Notes (Signed)
Clinical Social Work  MD DC patient. CSW updated FL2 and faxed FL2 and DC summary to [facility]. ALF agreeable to accept patient will come to hospital to pick up patient.   CSW met with patient at bedside. Patient upset about returning to ALF and wants to go home with family. CSW has worked with patient throughout the day and no family is agreeable for patient to stay with them and son is requesting that patient return to ALF. Patient is upset about returning to ALF. CSW explained that family could follow up at ALF and she could DC from there if family agreeable.  CSW prepared DC packet and ALF will pick up when they come to the hospital. MD spoke with son who is aware of DC plans.  [place], [place] [phone]

## 2013-07-23 ENCOUNTER — Other Ambulatory Visit: Payer: Self-pay | Admitting: Family Medicine

## 2013-07-23 ENCOUNTER — Ambulatory Visit
Admission: RE | Admit: 2013-07-23 | Discharge: 2013-07-23 | Disposition: A | Discharge status: Home / Self Care | Payer: Medicare Other | Source: Ambulatory Visit | Attending: Family Medicine | Admitting: Family Medicine

## 2013-07-23 DIAGNOSIS — M8580 Other specified disorders of bone density and structure, unspecified site: Secondary | ICD-10-CM

## 2013-07-23 DIAGNOSIS — E559 Vitamin D deficiency, unspecified: Secondary | ICD-10-CM

## 2013-07-23 DIAGNOSIS — M549 Dorsalgia, unspecified: Secondary | ICD-10-CM

## 2013-08-06 ENCOUNTER — Encounter (INDEPENDENT_AMBULATORY_CARE_PROVIDER_SITE_OTHER): Payer: Self-pay

## 2013-08-06 ENCOUNTER — Ambulatory Visit
Admission: RE | Admit: 2013-08-06 | Discharge: 2013-08-06 | Disposition: A | Discharge status: Home / Self Care | Payer: Medicare Other | Source: Ambulatory Visit | Attending: Family Medicine | Admitting: Family Medicine

## 2013-08-06 DIAGNOSIS — E559 Vitamin D deficiency, unspecified: Secondary | ICD-10-CM

## 2013-08-06 DIAGNOSIS — M8580 Other specified disorders of bone density and structure, unspecified site: Secondary | ICD-10-CM

## 2014-11-10 ENCOUNTER — Observation Stay (HOSPITAL_COMMUNITY): Payer: Medicare Other

## 2014-11-10 ENCOUNTER — Encounter (HOSPITAL_COMMUNITY): Payer: Self-pay | Admitting: Emergency Medicine

## 2014-11-10 ENCOUNTER — Emergency Department (HOSPITAL_COMMUNITY): Payer: Medicare Other

## 2014-11-10 ENCOUNTER — Observation Stay (HOSPITAL_COMMUNITY)
Admission: EM | Admit: 2014-11-10 | Discharge: 2014-11-12 | Disposition: A | Discharge status: Home / Self Care | Payer: Medicare Other | Attending: Internal Medicine | Admitting: Internal Medicine

## 2014-11-10 DIAGNOSIS — E876 Hypokalemia: Secondary | ICD-10-CM | POA: Diagnosis present

## 2014-11-10 DIAGNOSIS — I951 Orthostatic hypotension: Secondary | ICD-10-CM | POA: Diagnosis present

## 2014-11-10 DIAGNOSIS — I10 Essential (primary) hypertension: Secondary | ICD-10-CM | POA: Diagnosis not present

## 2014-11-10 DIAGNOSIS — Z66 Do not resuscitate: Secondary | ICD-10-CM | POA: Diagnosis not present

## 2014-11-10 DIAGNOSIS — E039 Hypothyroidism, unspecified: Secondary | ICD-10-CM | POA: Diagnosis not present

## 2014-11-10 DIAGNOSIS — R55 Syncope and collapse: Principal | ICD-10-CM | POA: Insufficient documentation

## 2014-11-10 DIAGNOSIS — E860 Dehydration: Secondary | ICD-10-CM | POA: Insufficient documentation

## 2014-11-10 DIAGNOSIS — E871 Hypo-osmolality and hyponatremia: Secondary | ICD-10-CM | POA: Diagnosis not present

## 2014-11-10 DIAGNOSIS — K5900 Constipation, unspecified: Secondary | ICD-10-CM | POA: Diagnosis not present

## 2014-11-10 DIAGNOSIS — R42 Dizziness and giddiness: Secondary | ICD-10-CM | POA: Insufficient documentation

## 2014-11-10 DIAGNOSIS — R262 Difficulty in walking, not elsewhere classified: Secondary | ICD-10-CM | POA: Insufficient documentation

## 2014-11-10 HISTORY — DX: Gastro-esophageal reflux disease without esophagitis: K21.9

## 2014-11-10 HISTORY — DX: Major depressive disorder, single episode, unspecified: F32.9

## 2014-11-10 HISTORY — DX: Depression, unspecified: F32.A

## 2014-11-10 HISTORY — DX: Other seasonal allergic rhinitis: J30.2

## 2014-11-10 HISTORY — DX: Anxiety disorder, unspecified: F41.9

## 2014-11-10 LAB — BASIC METABOLIC PANEL
ANION GAP: 9 (ref 5–15)
BUN: 7 mg/dL (ref 6–20)
CHLORIDE: 89 mmol/L — AB (ref 101–111)
CO2: 32 mmol/L (ref 22–32)
Calcium: 9.3 mg/dL (ref 8.9–10.3)
Creatinine, Ser: 0.91 mg/dL (ref 0.44–1.00)
GFR calc Af Amer: >60 mL/min (ref >60)
GFR, EST NON AFRICAN AMERICAN: 59 mL/min — AB (ref >60)
Glucose, Bld: 116 mg/dL — ABNORMAL HIGH (ref 65–99)
Potassium: 2.8 mmol/L — ABNORMAL LOW (ref 3.5–5.1)
SODIUM: 130 mmol/L — AB (ref 135–145)

## 2014-11-10 LAB — CBC WITH DIFFERENTIAL/PLATELET
BASOS ABS: 0 10*3/uL (ref 0.0–0.1)
BASOS ABS: 0 10*3/uL (ref 0.0–0.1)
BASOS PCT: 1 %
Basophils Relative: 1 %
EOS ABS: 0.1 10*3/uL (ref 0.0–0.7)
EOS PCT: 2 %
Eosinophils Absolute: 0 10*3/uL (ref 0.0–0.7)
Eosinophils Relative: 1 %
HCT: 40.1 % (ref 36.0–46.0)
HEMATOCRIT: 37.7 % (ref 36.0–46.0)
HEMOGLOBIN: 13.6 g/dL (ref 12.0–15.0)
Hemoglobin: 14.2 g/dL (ref 12.0–15.0)
LYMPHS ABS: 1.8 10*3/uL (ref 0.7–4.0)
LYMPHS PCT: 39 %
Lymphocytes Relative: 24 %
Lymphs Abs: 1.3 10*3/uL (ref 0.7–4.0)
MCH: 29.2 pg (ref 26.0–34.0)
MCH: 29.9 pg (ref 26.0–34.0)
MCHC: 35.4 g/dL (ref 30.0–36.0)
MCHC: 36.1 g/dL — AB (ref 30.0–36.0)
MCV: 82.5 fL (ref 78.0–100.0)
MCV: 82.9 fL (ref 78.0–100.0)
MONOS PCT: 8 %
Monocytes Absolute: 0.5 10*3/uL (ref 0.1–1.0)
Monocytes Absolute: 0.4 10*3/uL (ref 0.1–1.0)
Monocytes Relative: 10 %
NEUTROS ABS: 3.5 10*3/uL (ref 1.7–7.7)
NEUTROS PCT: 48 %
NEUTROS PCT: 66 %
Neutro Abs: 2.3 10*3/uL (ref 1.7–7.7)
PLATELETS: 159 10*3/uL (ref 150–400)
Platelets: 151 10*3/uL (ref 150–400)
RBC: 4.86 MIL/uL (ref 3.87–5.11)
RBC: 4.55 MIL/uL (ref 3.87–5.11)
RDW: 12.2 % (ref 11.5–15.5)
RDW: 12.3 % (ref 11.5–15.5)
WBC: 4.7 10*3/uL (ref 4.0–10.5)
WBC: 5.3 10*3/uL (ref 4.0–10.5)

## 2014-11-10 LAB — URINALYSIS, ROUTINE W REFLEX MICROSCOPIC
Bilirubin Urine: NEGATIVE
Glucose, UA: NEGATIVE mg/dL
Hgb urine dipstick: NEGATIVE
KETONES UR: NEGATIVE mg/dL
LEUKOCYTES UA: NEGATIVE
NITRITE: NEGATIVE
Protein, ur: NEGATIVE mg/dL
Specific Gravity, Urine: 1.005 (ref 1.005–1.030)
Urobilinogen, UA: 0.2 mg/dL (ref 0.0–1.0)
pH: 7.5 (ref 5.0–8.0)

## 2014-11-10 LAB — CBC
HCT: 38 % (ref 36.0–46.0)
Hemoglobin: 13.4 g/dL (ref 12.0–15.0)
MCH: 29.6 pg (ref 26.0–34.0)
MCHC: 35.3 g/dL (ref 30.0–36.0)
MCV: 83.9 fL (ref 78.0–100.0)
PLATELETS: 178 10*3/uL (ref 150–400)
RBC: 4.53 MIL/uL (ref 3.87–5.11)
RDW: 12.3 % (ref 11.5–15.5)
WBC: 5.9 10*3/uL (ref 4.0–10.5)

## 2014-11-10 LAB — TROPONIN I: Troponin I: <0.03 ng/mL (ref <0.031)

## 2014-11-10 LAB — COMPREHENSIVE METABOLIC PANEL
ALK PHOS: 48 U/L (ref 38–126)
ALT: <5 U/L — ABNORMAL LOW (ref 14–54)
ANION GAP: 7 (ref 5–15)
AST: 13 U/L — ABNORMAL LOW (ref 15–41)
Albumin: 3.5 g/dL (ref 3.5–5.0)
BUN: 6 mg/dL (ref 6–20)
CALCIUM: 8.9 mg/dL (ref 8.9–10.3)
CO2: 30 mmol/L (ref 22–32)
Chloride: 99 mmol/L — ABNORMAL LOW (ref 101–111)
Creatinine, Ser: 0.84 mg/dL (ref 0.44–1.00)
Glucose, Bld: 118 mg/dL — ABNORMAL HIGH (ref 65–99)
Potassium: 3.7 mmol/L (ref 3.5–5.1)
SODIUM: 136 mmol/L (ref 135–145)
Total Bilirubin: 0.5 mg/dL (ref 0.3–1.2)
Total Protein: 5.3 g/dL — ABNORMAL LOW (ref 6.5–8.1)

## 2014-11-10 LAB — MAGNESIUM: MAGNESIUM: 2 mg/dL (ref 1.7–2.4)

## 2014-11-10 LAB — MRSA PCR SCREENING: MRSA BY PCR: POSITIVE — AB

## 2014-11-10 MED ORDER — ENOXAPARIN SODIUM 40 MG/0.4ML ~~LOC~~ SOLN
40.0000 mg | SUBCUTANEOUS | Status: DC
  Administered 2014-11-10 – 2014-11-11 (×2): 40 mg via SUBCUTANEOUS
  Filled 2014-11-10 (×2): qty 0.4

## 2014-11-10 MED ORDER — PANTOPRAZOLE SODIUM 40 MG PO TBEC: 40.0000 mg | DELAYED_RELEASE_TABLET | Freq: Every day | ORAL | Status: DC

## 2014-11-10 MED ORDER — METHOCARBAMOL 500 MG PO TABS
1000.0000 mg | ORAL_TABLET | Freq: Three times a day (TID) | ORAL | Status: DC | PRN
  Administered 2014-11-10 – 2014-11-12 (×4): 1000 mg via ORAL
  Filled 2014-11-10 (×5): qty 2

## 2014-11-10 MED ORDER — TRAZODONE HCL 100 MG PO TABS
100.0000 mg | ORAL_TABLET | Freq: Every evening | ORAL | Status: DC | PRN
  Administered 2014-11-10 – 2014-11-11 (×2): 100 mg via ORAL
  Filled 2014-11-10 (×2): qty 1

## 2014-11-10 MED ORDER — HYDRALAZINE HCL 20 MG/ML IJ SOLN: 10.0000 mg | INTRAMUSCULAR | Status: DC | PRN

## 2014-11-10 MED ORDER — GABAPENTIN 300 MG PO CAPS: 300.0000 mg | ORAL_CAPSULE | Freq: Every day | ORAL | Status: DC

## 2014-11-10 MED ORDER — ACETAMINOPHEN 650 MG RE SUPP: 650.0000 mg | Freq: Four times a day (QID) | RECTAL | Status: DC | PRN

## 2014-11-10 MED ORDER — POTASSIUM CHLORIDE 10 MEQ/100ML IV SOLN
10.0000 meq | Freq: Once | INTRAVENOUS | Status: AC
  Administered 2014-11-10: 10 meq via INTRAVENOUS
  Filled 2014-11-10: qty 100

## 2014-11-10 MED ORDER — POTASSIUM CHLORIDE CRYS ER 20 MEQ PO TBCR
40.0000 meq | EXTENDED_RELEASE_TABLET | Freq: Once | ORAL | Status: AC
  Administered 2014-11-10: 40 meq via ORAL
  Filled 2014-11-10: qty 2

## 2014-11-10 MED ORDER — VENLAFAXINE HCL 50 MG PO TABS: 100.0000 mg | ORAL_TABLET | Freq: Three times a day (TID) | ORAL | Status: DC

## 2014-11-10 MED ORDER — BISACODYL 10 MG RE SUPP: 10.0000 mg | Freq: Once | RECTAL | Status: DC

## 2014-11-10 MED ORDER — SODIUM CHLORIDE 0.9 % IV SOLN
INTRAVENOUS | Status: DC
  Administered 2014-11-10: 16:00:00 via INTRAVENOUS
  Filled 2014-11-10 (×3): qty 1000

## 2014-11-10 MED ORDER — SODIUM CHLORIDE 0.9 % IV BOLUS (SEPSIS)
1000.0000 mL | Freq: Once | INTRAVENOUS | Status: AC
Start: 2014-11-10 — End: 2014-11-10
  Administered 2014-11-10: 1000 mL via INTRAVENOUS

## 2014-11-10 MED ORDER — POTASSIUM CHLORIDE IN NACL 20-0.9 MEQ/L-% IV SOLN
INTRAVENOUS | Status: AC
  Administered 2014-11-10 – 2014-11-11 (×3): via INTRAVENOUS
  Filled 2014-11-10 (×3): qty 1000

## 2014-11-10 MED ORDER — VITAMIN D (ERGOCALCIFEROL) 1.25 MG (50000 UNIT) PO CAPS: 50000.0000 [IU] | ORAL_CAPSULE | ORAL | Status: DC

## 2014-11-10 MED ORDER — ONDANSETRON HCL 4 MG PO TABS
4.0000 mg | ORAL_TABLET | Freq: Four times a day (QID) | ORAL | Status: DC | PRN
  Administered 2014-11-11 – 2014-11-12 (×3): 4 mg via ORAL
  Filled 2014-11-10 (×3): qty 1

## 2014-11-10 MED ORDER — PANTOPRAZOLE SODIUM 20 MG PO TBEC
20.0000 mg | DELAYED_RELEASE_TABLET | Freq: Two times a day (BID) | ORAL | Status: DC
  Administered 2014-11-10 – 2014-11-12 (×4): 20 mg via ORAL
  Filled 2014-11-10 (×4): qty 1

## 2014-11-10 MED ORDER — POLYETHYLENE GLYCOL 3350 17 G PO PACK
17.0000 g | PACK | Freq: Every day | ORAL | Status: DC
  Administered 2014-11-10: 17 g via ORAL
  Filled 2014-11-10: qty 1

## 2014-11-10 MED ORDER — ENSURE ENLIVE PO LIQD
237.0000 mL | Freq: Two times a day (BID) | ORAL | Status: DC
  Administered 2014-11-11 – 2014-11-12 (×4): 237 mL via ORAL

## 2014-11-10 MED ORDER — ONDANSETRON HCL 4 MG/2ML IJ SOLN
4.0000 mg | Freq: Four times a day (QID) | INTRAMUSCULAR | Status: DC | PRN
Start: 2014-11-10 — End: 2014-11-12
  Administered 2014-11-12: 4 mg via INTRAVENOUS
  Filled 2014-11-10: qty 2

## 2014-11-10 MED ORDER — LEVOTHYROXINE SODIUM 112 MCG PO TABS
112.0000 ug | ORAL_TABLET | Freq: Every day | ORAL | Status: DC
  Administered 2014-11-10 – 2014-11-12 (×3): 112 ug via ORAL
  Filled 2014-11-10 (×4): qty 1

## 2014-11-10 MED ORDER — KETOTIFEN FUMARATE 0.025 % OP SOLN: 1.0000 [drp] | Freq: Two times a day (BID) | OPHTHALMIC | Status: DC

## 2014-11-10 MED ORDER — ACETAMINOPHEN 325 MG PO TABS
650.0000 mg | ORAL_TABLET | Freq: Four times a day (QID) | ORAL | Status: DC | PRN
  Administered 2014-11-11 – 2014-11-12 (×4): 650 mg via ORAL
  Filled 2014-11-10 (×3): qty 2

## 2014-11-10 MED ORDER — MAGNESIUM SULFATE IN D5W 10-5 MG/ML-% IV SOLN
1.0000 g | Freq: Once | INTRAVENOUS | Status: AC
  Administered 2014-11-10: 1 g via INTRAVENOUS
  Filled 2014-11-10: qty 100

## 2014-11-10 NOTE — ED Notes (Signed)
Heart healthy breakfast tray ordered 

## 2014-11-10 NOTE — H&P (Addendum)
[facility] History and Physical  Erika Conley TKW:[id number] DOB: 1934-12-24 DOA: 11/10/2014  Referring physician: Dr. Dina Rich. PCP: Leonard Downing, MD  Specialists: None.  Chief Complaint: Loss of consciousness.  HPI: Erika Conley is a 80 y.o. female with history of hypertension and hypothyroidism was brought to the ER from the living facility after patient had a brief episode of syncopal episode. Patient states she was waking up in the middle of the night around 2 AM to go to the bathroom when she felt dizzy and lost consciousness. And in the process patient did hit her head onto the floor. Patient did not have any chest pain or shortness of breath. Did not have any tongue bite or incontinence of urine though patient didn't urinate on herself since she was not able to make it to the bathroom. In the ER CT head was unremarkable. EKG was showing sinus rhythm with prolonged QT interval and metabolic panel showing severe hypokalemia. Patient was markedly orthostatic with blood pressure dropping into the 92E systolic on sitting. Patient denies having any nausea vomiting or diarrhea but has been having poor appetite over the last few days. Patient was started on IV fluids and admitted for syncope most likely from dehydration with orthostatic changes.    Review of Systems: As presented in the history of presenting illness, rest negative.  Past Medical History  Diagnosis Date  . Falls frequently   . Arthritis   . Hypertension   . Fracture, clavicle   . Thyroid disease   . History of fractured vertebra   . Wrist fracture, right    Past Surgical History  Procedure Laterality Date  . Thyroidectomy    . Abdominal hysterectomy    . Back surgery    . Cholecystectomy    . Wrist surgery    . Fracture surgery     Social History:  reports that she has never smoked. She has never used smokeless tobacco. She reports that she does not drink alcohol or use illicit drugs. Where does  [facility]. [facility] patient [facility] in [facility].  Allergies  Allergen Reactions  . Codeine     Pass out, fall  . Cortisone     Pass out, fall   . Other     All narcotics cause patient to Pass out, fall     Family History:  Family History  Problem Relation Age of Onset  . Hypertension Other       Prior to Admission medications   Medication Sig Start Date End Date Taking? Authorizing Provider  acetaminophen (TYLENOL) 325 MG tablet Take 2 tablets (650 mg total) by mouth every 6 (six) hours as needed for mild pain. 06/06/13   Kinnie Feil, MD  amLODipine (NORVASC) 5 MG tablet Take 1 tablet (5 mg total) by mouth daily. 06/06/13   Kinnie Feil, MD  amoxicillin-clavulanate (AUGMENTIN) 875-125 MG per tablet Take 1 tablet by mouth 2 (two) times daily. 06/06/13   Kinnie Feil, MD  BusPIRone HCl (BUSPAR PO) Take 10 mg by mouth 2 (two) times daily.    Historical Provider, MD  calcium carbonate (TUMS - DOSED IN MG ELEMENTAL CALCIUM) 500 MG chewable tablet Chew 1 tablet by mouth 3 (three) times daily as needed for indigestion or heartburn.    Historical Provider, MD  gabapentin (NEURONTIN) 300 MG capsule Take 300 mg by mouth at bedtime.     Historical Provider, MD  ketotifen (ZADITOR) 0.025 % ophthalmic solution Place 1 drop into both  eyes 2 (two) times daily.    Historical Provider, MD  levothyroxine (SYNTHROID, LEVOTHROID) 112 MCG tablet Take 112 mcg by mouth daily before breakfast.    Historical Provider, MD  loperamide (IMODIUM) 2 MG capsule Take 2 mg by mouth as needed for diarrhea or loose stools.    Historical Provider, MD  methocarbamol (ROBAXIN) 500 MG tablet Take 2 tablets (1,000 mg total) by mouth every 8 (eight) hours as needed for muscle spasms. 06/06/13   Kinnie Feil, MD  pantoprazole (PROTONIX) 40 MG tablet Take 40 mg by mouth daily.    Historical Provider, MD  traZODone (DESYREL) 100 MG tablet Take 100 mg by mouth at bedtime as needed for sleep.     Historical Provider, MD  venlafaxine (EFFEXOR) 100 MG tablet Take 100 mg by mouth 3 (three) times daily.    Historical Provider, MD  Vitamin D, Ergocalciferol, (DRISDOL) 50000 UNITS CAPS capsule Take 50,000 Units by mouth every 14 (fourteen) days.    Historical Provider, MD    Physical Exam: Filed Vitals:   11/10/14 0511 11/10/14 0515 11/10/14 0530 11/10/14 0545  BP: 119/43 101/47 120/57 126/40  Pulse: 55 54 55 57  TempSrc:      Resp: 16 26 22 23   SpO2: 96% 100% 96% 97%     General:  Moderately built and nourished.  Eyes: Anicteric no pallor.  ENT: No discharge from the ears eyes nose or mouth.  Neck: No mass felt.  Cardiovascular: S1 and S2 heard.  Respiratory: No rhonchi or crepitations.  Abdomen: Soft nontender bowel sounds present.  Skin: No rash.  Musculoskeletal: No edema.  Psychiatric: Appears normal.  Neurologic: Alert awake oriented to time place and person. Moves all extremities 5 x 5. No facial asymmetry. Tongue is midline.  Labs on Admission:  Basic Metabolic Panel:  Recent Labs Lab 11/10/14 0350  NA 130*  K 2.8*  CL 89*  CO2 32  GLUCOSE 116*  BUN 7  CREATININE 0.91  CALCIUM 9.3   Liver Function Tests: No results for input(s): AST, ALT, ALKPHOS, BILITOT, PROT, ALBUMIN in the last 168 hours. No results for input(s): LIPASE, AMYLASE in the last 168 hours. No results for input(s): AMMONIA in the last 168 hours. CBC:  Recent Labs Lab 11/10/14 0350  WBC 4.7  NEUTROABS 2.3  HGB 14.2  HCT 40.1  MCV 82.5  PLT 159   Cardiac Enzymes:  Recent Labs Lab 11/10/14 0350  TROPONINI <0.03    BNP (last 3 results) No results for input(s): BNP in the last 8760 hours.  ProBNP (last 3 results) No results for input(s): PROBNP in the last 8760 hours.  CBG: No results for input(s): GLUCAP in the last 168 hours.  Radiological Exams on Admission: Ct Head Wo Contrast  11/10/2014   CLINICAL DATA:  Unwitnessed fall, right eye hematoma,  dizziness, nausea  EXAM: CT HEAD WITHOUT CONTRAST  TECHNIQUE: Contiguous axial images were obtained from the base of the skull through the vertex without intravenous contrast.  COMPARISON:  06/04/2013  FINDINGS: No evidence of parenchymal hemorrhage or extra-axial fluid collection. No mass lesion, mass effect, or midline shift.  No CT evidence of acute infarction.  Mild subcortical [place]elkers matter and periventricular small vessel ischemic changes. Intracranial atherosclerosis.  Mild age related atrophy.  No ventriculomegaly.  The visualized paranasal sinuses are essentially clear. The mastoid air cells are unopacified.  No evidence of calvarial fracture.  IMPRESSION: No evidence of acute intracranial abnormality.  Mild small vessel ischemic changes.  Electronically Signed   By: Julian Hy M.D.   On: 11/10/2014 04:55    EKG: Independently reviewed. Normal sinus rhythm. QT interval of 584 ms.  Assessment/Plan Principal Problem:   Syncope Active Problems:   Hypokalemia   Hypertension   Hypothyroidism   Orthostatic hypotension   1. Syncope - with orthostatic changes probably secondary to dehydration. However patient's EKG shows prolonged QT of 584 ms. Patient does have hypokalemia which may also be contributing to patient's abnormal EKG changes. Patient states she has been not eating well last few days. At this time we will continue with aggressive hydration. I'm holding all patient's antihypertensives for now. Check orthostatics after hydration and get physical therapy consult. 2. Hypokalemia and hyponatremia probably from dehydration - replace and recheck. 3. Prolonged QT interval - replace potassium and recheck EKG. Check magnesium levels patient was given 1 g of IV mag the ER. Avoid QT prolonging medications. 4. Hypothyroidism on Synthroid.  I have reviewed patient's old charts are labs.   DVT Prophylaxis Lovenox.  Code Status: DO NOT RESUSCITATE.  Family Communication: Discussed with  patient.  Disposition Plan: Admit for observation.    KAKRAKANDY,ARSHAD N. [facility] Pager [phone].  If 7PM-7AM, please contact night-coverage [email or web address] Password [email or web address] 11/10/2014, 6:30 AM

## 2014-11-10 NOTE — ED Notes (Addendum)
Pt placed in hospital gown, yellow socks, and yellow fall wrist band.

## 2014-11-10 NOTE — Progress Notes (Addendum)
Erika Conley [id number] Admission Data: 11/10/2014 3:06 PM Attending Berthe Oley: Rise Patience, MD  JQG:BEEFEO,FHQRFX Danne Baxter, MD Consults/ Treatment Team:    Erika Conley is a 80 y.o. female patient admitted from ED awake, alert  & orientated  X 3,  DNR, VSS - Blood pressure 132/70, pulse 74, temperature 97.5 F (36.4 C), resp. rate 18, SpO2 95 %., no c/o shortness of breath, no c/o chest pain, no distress noted. Tele # 20 placed..   IV site WDL: Left A/C running fluids.   Allergies:   Allergies  Allergen Reactions  . Codeine     Pass out, fall  . Cortisone     Pass out, fall   . Other     All narcotics cause patient to Pass out, fall      Past Medical History  Diagnosis Date  . Falls frequently   . Arthritis   . Hypertension   . Fracture, clavicle   . Thyroid disease   . History of fractured vertebra   . Wrist fracture, right       Pt orientation to unit, room and routine. Information packet given to patient/family and safety video watched.  Admission INP armband ID verified with patient/family, and in place. SR up x 2, fall risk assessment complete with Patient and family verbalizing understanding of risks associated with falls. Pt verbalizes an understanding of how to use the call bell and to call for help before getting out of bed.  Skin, clean-dry- intact without evidence of skin tears.   No evidence of skin break down noted on exam. Small bruise noted around right eye area.      Will cont to monitor and assist as needed.  Dayle Points, RN 11/10/2014 3:06 PM

## 2014-11-10 NOTE — Evaluation (Signed)
Physical Therapy Evaluation Patient Details Name: RUSSELL ENGELSTAD MRN: [id number] DOB: 1935/02/07 Today's Date: 11/10/2014   History of Present Illness  Erika Conley is a 80 y.o. female with history of hypertension and hypothyroidism was brought to the ER from the living facility after patient had a brief episode of syncopal episode. Patient states she was waking up in the middle of the night around 2 AM to go to the bathroom when she felt dizzy and lost consciousness.  Clinical Impression  Complete vestibular assessment and treated pt for R horizontal canal BPPV. PT to re-assess pt 9/21 AM to assess effectiveness of treatment. Pt was ambulating mod I with cane PTA now requires modA for all OOB mobility due to LE weakness, coordination deficits and dizziness with mobility making patient a high fall risk. Recommend SNF upon d/c to achieve safe mod I level of function to return to retirement home.    Follow Up Recommendations SNF;Supervision/Assistance - 24 hour (facility who has vestibular rehab)    Equipment Recommendations  Rolling walker with 5" wheels    Recommendations for Other Services       Precautions / Restrictions Precautions Precautions: Fall Precaution Comments: suspect BPPV Restrictions Weight Bearing Restrictions: No      Mobility  Bed Mobility Overal bed mobility: Needs Assistance Bed Mobility: Rolling;Sidelying to Sit Rolling: Mod assist Sidelying to sit: Mod assist       General bed mobility comments: modA for trunk elevation due to back pain, guided due to dizziness as well  Transfers Overall transfer level: Needs assistance Equipment used: 1 person hand held assist Transfers: Sit to/from [employer] Transfers Sit to Stand: Mod assist Stand pivot transfers: Mod assist       General transfer comment: pt remains dizzy and very guarded with short shuffled steps, pt fearfull of falling  Ambulation/Gait             General Gait Details:  held today due to con't dizziness  Stairs            Wheelchair Mobility    Modified Rankin (Stroke Patients Only)       Balance Overall balance assessment: Needs assistance Sitting-balance support: Feet supported Sitting balance-Leahy Scale: Good     Standing balance support: Bilateral upper extremity supported Standing balance-Leahy Scale: Poor Standing balance comment: requires physical assist to maintain standing balance                             Pertinent Vitals/Pain Pain Assessment: 0-10 Pain Score: 7  Pain Location: back Pain Intervention(s): Monitored during session    Home Living Family/patient expects to be discharged to:: Skilled nursing facility                 Additional Comments: from [facility], was amb with cane. amb to [employer], has a Stage manager    Prior Function Level of Independence: Needs assistance   Gait / Transfers Assistance Needed: uses cane  ADL's / Homemaking Assistance Needed: assist for dressing/bathing  Comments: meals provided     Hand Dominance   Dominant Hand: Right    Extremity/Trunk Assessment   Upper Extremity Assessment: Generalized weakness           Lower Extremity Assessment: Generalized weakness (impaired co-ordination)      Cervical / Trunk Assessment: Normal  Communication   Communication: No difficulties  Cognition Arousal/Alertness: Awake/alert Behavior During Therapy: WFL for tasks assessed/performed Overall Cognitive  Status: Within Functional Limits for tasks assessed                      General Comments General comments (skin integrity, edema, etc.): VESTIBULAR ASSESSMENT: Pt with + report of dizziness with horizontal head turns to R and L with R worse than L. pt with no nystagmus but c/o dizziness. Pt treated for R horizontal BPPV. pt reported feeling better however remained still dizzy with getting up in to chair but not as bad    Exercises         Assessment/Plan    PT Assessment Patient needs continued PT services  PT Diagnosis Difficulty walking (vestibular dysfunction)   PT Problem List Decreased strength;Decreased activity tolerance;Decreased balance;Decreased mobility;Decreased coordination;Decreased knowledge of use of DME (vestibular dysfunction)  PT Treatment Interventions DME instruction;Gait training;Functional mobility training;Therapeutic activities;Therapeutic exercise   PT Goals (Current goals can be found in the Care Plan section) Acute Rehab PT Goals Patient Stated Goal: get better PT Goal Formulation: With patient Time For Goal Achievement: 11/17/14 Potential to Achieve Goals: Good Additional Goals Additional Goal #1: Pt to no longer have dizziness with head turns to R and L.    Frequency Min 4X/week   Barriers to discharge        Co-evaluation               End of Session Equipment Utilized During Treatment: Gait belt Activity Tolerance: Patient tolerated treatment well Patient left: in chair;with call bell/phone within reach Nurse Communication: Mobility status    Functional Assessment Tool Used: clinical judgement Functional Limitation: Mobility: Walking and moving around Mobility: Walking and Moving Around Current Status (T0240): At least 40 percent but less than 60 percent impaired, limited or restricted Mobility: Walking and Moving Around Goal Status [phone]): At least 1 percent but less than 20 percent impaired, limited or restricted    Time: 2992-4268 PT Time Calculation (min) (ACUTE ONLY): 31 min   Charges:   PT Evaluation $Initial PT Evaluation Tier I: 1 Procedure PT Treatments $Canalith Rep Proc: 8-22 mins   PT G Codes:   PT G-Codes **NOT FOR INPATIENT CLASS** Functional Assessment Tool Used: clinical judgement Functional Limitation: Mobility: Walking and moving around Mobility: Walking and Moving Around Current Status (T4196): At least 40 percent but less than 60 percent  impaired, limited or restricted Mobility: Walking and Moving Around Goal Status [phone]): At least 1 percent but less than 20 percent impaired, limited or restricted    Kingsley Callander 11/10/2014, 5:01 PM   Kittie Plater, PT, DPT Pager #: [phone] Office #: [phone]

## 2014-11-10 NOTE — ED Notes (Addendum)
Pt brought up to 5W to find out that [facility]W was no longer accepting tele pts. Pt brought back down to ED to remain in B17 until tele bed is requested.

## 2014-11-10 NOTE — ED Notes (Signed)
Attempted report X1. Will call back 5 min

## 2014-11-10 NOTE — ED Notes (Signed)
Admitting MD at bedside.

## 2014-11-10 NOTE — ED Notes (Signed)
Pt offered a meal.

## 2014-11-10 NOTE — Progress Notes (Signed)
Received report from [place], RN in ED for transfer of pt to [phone].

## 2014-11-10 NOTE — ED Notes (Signed)
Pt is also requesting for a nurse to look at the sore on the right corner of her mouth between her top and bottom lip, stating the area hurts.

## 2014-11-10 NOTE — ED Notes (Signed)
Attempted report 

## 2014-11-10 NOTE — ED Notes (Signed)
Pt in EMS from [facility] center. Approx 0230 woke up to use bathroom, felt dizzy and slid to floor also reports nausea. Unwitnessed fall, denies LOC. Does have hematoma present to R eye area. Denies pain, SOB, CP. Hx vertigo

## 2014-11-10 NOTE — ED Provider Notes (Signed)
CSN: [id number]   Arrival date & time 11/10/14 0315  History  This chart was scribed for Merryl Hacker, MD by Altamease Oiler, ED Scribe. This patient was seen in room B17C/B17C and the patient's care was started at 3:34 AM.  Chief Complaint  Patient presents with  . Dizziness    HPI The history is provided by the patient. No language interpreter was used.   Brought in by EMS from [place] is a 80 y.o. female with PMHx of HTN and arthritis who presents to the Emergency Department complaining of an episode of new dizziness ("off balance") with onset this morning. Pt tried to get out of bed to go to the bathroom and when she stood up she was dizzy. She slid to the floor and struck her head. She remembers the incident but states that she was told that she lost consciousness by her caregivers. Associated symptoms include bruising at the right side of the head. Pt denies extremity pain, chest pain, abdominal pain, dysuria, and hematuria. Patient denies that she is on any anticoagulation. Reports generalized decreased appetite over the last 1-2 months. Denies any abdominal pain.  Past Medical History  Diagnosis Date  . Falls frequently   . Arthritis   . Hypertension   . Fracture, clavicle   . Thyroid disease   . History of fractured vertebra   . Wrist fracture, right     Past Surgical History  Procedure Laterality Date  . Thyroidectomy    . Abdominal hysterectomy    . Back surgery    . Cholecystectomy    . Wrist surgery    . Fracture surgery      No family history on file.  Social History  Substance Use Topics  . Smoking status: Never Smoker   . Smokeless tobacco: Never Used  . Alcohol Use: No     Review of Systems  Constitutional: Negative for fever.  Respiratory: Negative for chest tightness and shortness of breath.   Cardiovascular: Negative for chest pain.  Gastrointestinal: Negative for nausea, vomiting and abdominal pain.   Genitourinary: Negative for dysuria.  Musculoskeletal: Negative for back pain.  Skin: Positive for wound.  Neurological: Positive for dizziness and light-headedness. Negative for facial asymmetry, weakness, numbness and headaches.  Psychiatric/Behavioral: Negative for confusion.  All other systems reviewed and are negative.  Home Medications   Prior to Admission medications   Medication Sig Start Date End Date Taking? Authorizing Khaliya Golinski  acetaminophen (TYLENOL) 325 MG tablet Take 2 tablets (650 mg total) by mouth every 6 (six) hours as needed for mild pain. 06/06/13   Kinnie Feil, MD  amLODipine (NORVASC) 5 MG tablet Take 1 tablet (5 mg total) by mouth daily. 06/06/13   Kinnie Feil, MD  amoxicillin-clavulanate (AUGMENTIN) 875-125 MG per tablet Take 1 tablet by mouth 2 (two) times daily. 06/06/13   Kinnie Feil, MD  BusPIRone HCl (BUSPAR PO) Take 10 mg by mouth 2 (two) times daily.    Historical Todd Jelinski, MD  calcium carbonate (TUMS - DOSED IN MG ELEMENTAL CALCIUM) 500 MG chewable tablet Chew 1 tablet by mouth 3 (three) times daily as needed for indigestion or heartburn.    Historical Benjamim Harnish, MD  gabapentin (NEURONTIN) 300 MG capsule Take 300 mg by mouth at bedtime.     Historical Stephenia Vogan, MD  ketotifen (ZADITOR) 0.025 % ophthalmic solution Place 1 drop into both eyes 2 (two) times daily.    Historical Daisy Mcneel, MD  levothyroxine (SYNTHROID,  LEVOTHROID) 112 MCG tablet Take 112 mcg by mouth daily before breakfast.    Historical Delton Stelle, MD  loperamide (IMODIUM) 2 MG capsule Take 2 mg by mouth as needed for diarrhea or loose stools.    Historical Benita Boonstra, MD  methocarbamol (ROBAXIN) 500 MG tablet Take 2 tablets (1,000 mg total) by mouth every 8 (eight) hours as needed for muscle spasms. 06/06/13   Kinnie Feil, MD  pantoprazole (PROTONIX) 40 MG tablet Take 40 mg by mouth daily.    Historical Arlee Santosuosso, MD  traZODone (DESYREL) 100 MG tablet Take 100 mg by mouth at bedtime as  needed for sleep.    Historical Tristan Bramble, MD  venlafaxine (EFFEXOR) 100 MG tablet Take 100 mg by mouth 3 (three) times daily.    Historical Lavon Horn, MD  Vitamin D, Ergocalciferol, (DRISDOL) 50000 UNITS CAPS capsule Take 50,000 Units by mouth every 14 (fourteen) days.    Historical Lovel Suazo, MD    Allergies  Codeine; Cortisone; and Other  Triage Vitals: BP 110/43 mmHg  Pulse 53  Resp 23  SpO2 91%  Physical Exam  Constitutional: She is oriented to person, place, and time. No distress.  Elderly  HENT:  Head: Normocephalic.  Mouth/Throat: Oropharynx is clear and moist.  Small, 1 cm contusion noted over the right for head, no evidence of hemotympanum  Eyes: Pupils are equal, round, and reactive to light.  Neck: Normal range of motion. Neck supple.  No midline step-off or deformity, no tenderness  Cardiovascular: Normal rate, regular rhythm and normal heart sounds.   Pulmonary/Chest: Effort normal and breath sounds normal. No respiratory distress. She has no wheezes.  Abdominal: Soft. Bowel sounds are normal. There is no tenderness. There is no rebound.  Musculoskeletal: She exhibits no edema.  No deformities noted, normal range of motion of the bilateral hips and knees  Neurological: She is alert and oriented to person, place, and time.  No dysmetria to finger-nose-finger, 5 out of 5 strength in all 4 extremities, cranial nerves II through XII intact  Skin: Skin is warm and dry.  Psychiatric: She has a normal mood and affect.  Nursing note and vitals reviewed.   ED Course  Procedures   DIAGNOSTIC STUDIES: Oxygen Saturation is 97% on RA, normal by my interpretation.    COORDINATION OF CARE: 3:39 AM Discussed treatment plan which includes CT head without contrast, EKG, and lab work with pt at bedside and pt agreed to plan.  Labs Reviewed  BASIC METABOLIC PANEL - Abnormal; Notable for the following:    Sodium 130 (*)    Potassium 2.8 (*)    Chloride 89 (*)    Glucose, Bld  116 (*)    GFR calc non Af Amer 59 (*)    All other components within normal limits  TROPONIN I  CBC WITH DIFFERENTIAL/PLATELET  URINALYSIS, ROUTINE W REFLEX MICROSCOPIC (NOT AT [facility])    Imaging Review Ct Head Wo Contrast  11/10/2014   CLINICAL DATA:  Unwitnessed fall, right eye hematoma, dizziness, nausea  EXAM: CT HEAD WITHOUT CONTRAST  TECHNIQUE: Contiguous axial images were obtained from the base of the skull through the vertex without intravenous contrast.  COMPARISON:  06/04/2013  FINDINGS: No evidence of parenchymal hemorrhage or extra-axial fluid collection. No mass lesion, mass effect, or midline shift.  No CT evidence of acute infarction.  Mild subcortical white matter and periventricular small vessel ischemic changes. Intracranial atherosclerosis.  Mild age related atrophy.  No ventriculomegaly.  The visualized paranasal sinuses are essentially clear. The  mastoid air cells are unopacified.  No evidence of calvarial fracture.  IMPRESSION: No evidence of acute intracranial abnormality.  Mild small vessel ischemic changes.   Electronically Signed   By: Julian Hy M.D.   On: 11/10/2014 04:55    EKG Interpretation  Date/Time:  Tuesday November 10 2014 03:31:15 EDT Ventricular Rate:  60 PR Interval:  209 QRS Duration: 101 QT Interval:  584 QTC Calculation: 584 R Axis:   -2 Text Interpretation:  Sinus rhythm Ventricular premature complex Low voltage, precordial leads Nonspecific T abnrm, anterolateral leads Prolonged QT interval Confirmed by HORTON  MD, [place] ([id number]) on 11/10/2014 5:04:20 AM    MDM   Final diagnoses:  Dizziness  Orthostasis  Dehydration  Hypokalemia    Patient presents with an episode of dizziness. Positional. Resulted in a fall earlier this morning.  Small contusion noted to the right for head. Otherwise the patient is nontoxic. Vital signs are reassuring. Patient reports decreased by mouth intake.  EKG is unchanged from prior. Patient has a prolonged  QT but no other arrhythmia. BMP notable for a sodium of 1:30 and potassium of 2.8. Patient was given normal saline bolus and both oral and IV potassium. Urinalysis without evidence of urinary tract infection. Troponin negative. CT scan of the head is negative for acute bleed. Patient did drop her blood pressure when standing. Suspect patient is dehydrated given hyponatremia and hypokalemia. She also has evidence of orthostasis. Given this, the patient would benefit from admission for rehydration and potassium replacement. No traumatic injury noted.  I personally performed the services described in this documentation, which was scribed in my presence. The recorded information has been reviewed and is accurate.    Merryl Hacker, MD 11/10/14 [phone]

## 2014-11-10 NOTE — Progress Notes (Signed)
[facility] PROGRESS NOTE  Erika Conley WVP:[id number] DOB: 1934/09/25 DOA: 11/10/2014 PCP: Leonard Downing, MD  Assessment/Plan: 1. Syncope likely secondary to orthostatic hypotension 1. Pt noted to be orthostatic. On further questioning, pt admits to poor PO intake resulting in almost 5 lb wt loss in the past month 2. Cont IVF as tolerated 3. Would repeat serial orthostatic vitals 2. Hyokalemia 1. Suspect secondary to prolonged poor PO intake 2. Cont to replace lytes as needed 3. Improved today 3. Hyponatremia 1. Suspect secondary to dehydration/malnutrition 2. Normalized overnight with hyration 4. Prolonged QTc 1. Possible secondary to electrolyte abnormalities 2. Correct lytes 3. Will repeat EKG in AM 5. Hypothyroid 1. Cont replacement as tolerated 6. Constipation 1. Notable stool burden noted on xray 2. Will start cathartics as tolerated 7. DVT prophylaxis 1. Lovenox subQ  Code Status: DNR Family Communication: Pt in room Disposition Plan: Pending   Consultants:    Procedures:    Antibiotics:    HPI/Subjective: Feels better today, but still feels weak  Objective: Filed Vitals:   11/10/14 0930 11/10/14 1030 11/10/14 1300 11/10/14 1400  BP: 129/43 110/88 121/62 132/70  Pulse: 66 74 72 74  Temp:      TempSrc:      Resp: 18 18    SpO2: 99% 99% 98% 95%    Intake/Output Summary (Last 24 hours) at 11/10/14 1611 Last data filed at 11/10/14 1028  Gross per 24 hour  Intake      0 ml  Output    700 ml  Net   -700 ml   There were no vitals filed for this visit.  Exam:   General:  Awake, in nad  Cardiovascular: regular, s1, s2  Respiratory: normal resp effort, no wheezing  Abdomen: soft,nondistended  Musculoskeletal: perfused, no clubbing   Data Reviewed: Basic Metabolic Panel:  Recent Labs Lab 11/10/14 0350 11/10/14 0844  NA 130* 136  K 2.8* 3.7  CL 89* 99*  CO2 32 30  GLUCOSE 116* 118*  BUN 7 6  CREATININE 0.91 0.84   CALCIUM 9.3 8.9  MG  --  2.0   Liver Function Tests:  Recent Labs Lab 11/10/14 0844  AST 13*  ALT <5*  ALKPHOS 48  BILITOT 0.5  PROT 5.3*  ALBUMIN 3.5   No results for input(s): LIPASE, AMYLASE in the last 168 hours. No results for input(s): AMMONIA in the last 168 hours. CBC:  Recent Labs Lab 11/10/14 0350 11/10/14 0844  WBC 4.7 5.3  NEUTROABS 2.3 3.5  HGB 14.2 13.6  HCT 40.1 37.7  MCV 82.5 82.9  PLT 159 151   Cardiac Enzymes:  Recent Labs Lab 11/10/14 0350  TROPONINI <0.03   BNP (last 3 results) No results for input(s): BNP in the last 8760 hours.  ProBNP (last 3 results) No results for input(s): PROBNP in the last 8760 hours.  CBG: No results for input(s): GLUCAP in the last 168 hours.  No results found for this or any previous visit (from the past 240 hour(s)).   Studies: Ct Head Wo Contrast  11/10/2014   CLINICAL DATA:  Unwitnessed fall, right eye hematoma, dizziness, nausea  EXAM: CT HEAD WITHOUT CONTRAST  TECHNIQUE: Contiguous axial images were obtained from the base of the skull through the vertex without intravenous contrast.  COMPARISON:  06/04/2013  FINDINGS: No evidence of parenchymal hemorrhage or extra-axial fluid collection. No mass lesion, mass effect, or midline shift.  No CT evidence of acute infarction.  Mild subcortical white matter and  periventricular small vessel ischemic changes. Intracranial atherosclerosis.  Mild age related atrophy.  No ventriculomegaly.  The visualized paranasal sinuses are essentially clear. The mastoid air cells are unopacified.  No evidence of calvarial fracture.  IMPRESSION: No evidence of acute intracranial abnormality.  Mild small vessel ischemic changes.   Electronically Signed   By: Julian Hy M.D.   On: 11/10/2014 04:55   Dg Abd Portable 1v  11/10/2014   CLINICAL DATA:  Constipation.  EXAM: PORTABLE ABDOMEN - 1 VIEW  COMPARISON:  Lumbar x-ray series July 23, 2013  FINDINGS: The bowel gas pattern is  normal. Moderate bowel content is identified throughout colon. There is no bowel obstruction. There is scoliosis of the spine. Patient status post vertebroplasty of lower thoracic vertebral bodies. Surgical clips are identified right upper quadrant unchanged.  IMPRESSION: Moderate bowel content throughout colon consistent with constipation. No bowel obstruction.   Electronically Signed   By: Abelardo Diesel M.D.   On: 11/10/2014 13:38    Scheduled Meds: . bisacodyl  10 mg Rectal Once  . enoxaparin (LOVENOX) injection  40 mg Subcutaneous Q24H  . levothyroxine  112 mcg Oral QAC breakfast  . pantoprazole  20 mg Oral BID  . polyethylene glycol  17 g Oral Daily   Continuous Infusions: . 0.9 % NaCl with KCl 20 mEq / L 125 mL/hr at 11/10/14 1600    Principal Problem:   Syncope Active Problems:   Hypokalemia   Hypertension   Hypothyroidism   Orthostatic hypotension    CHIU, STEPHEN K  [facility] Pager [phone]. If 7PM-7AM, please contact night-coverage at [email or web address], password [email or web address] 11/10/2014, 4:11 PM

## 2014-11-10 NOTE — ED Notes (Signed)
Bed placement called to inform me that the patient's bed assignment was taken away to be changed to a different type of room.

## 2014-11-11 DIAGNOSIS — I10 Essential (primary) hypertension: Secondary | ICD-10-CM | POA: Diagnosis not present

## 2014-11-11 DIAGNOSIS — K5900 Constipation, unspecified: Secondary | ICD-10-CM

## 2014-11-11 DIAGNOSIS — R55 Syncope and collapse: Secondary | ICD-10-CM

## 2014-11-11 DIAGNOSIS — E876 Hypokalemia: Secondary | ICD-10-CM | POA: Diagnosis not present

## 2014-11-11 DIAGNOSIS — R42 Dizziness and giddiness: Secondary | ICD-10-CM | POA: Diagnosis not present

## 2014-11-11 DIAGNOSIS — E039 Hypothyroidism, unspecified: Secondary | ICD-10-CM | POA: Diagnosis not present

## 2014-11-11 LAB — TROPONIN I: Troponin I: <0.03 ng/mL (ref <0.031)

## 2014-11-11 MED ORDER — TRAMADOL HCL 50 MG PO TABS
50.0000 mg | ORAL_TABLET | Freq: Four times a day (QID) | ORAL | Status: DC | PRN
  Administered 2014-11-11 – 2014-11-12 (×3): 50 mg via ORAL
  Filled 2014-11-11 (×3): qty 1

## 2014-11-11 MED ORDER — FLUOXETINE HCL 40 MG PO CAPS: 80.0000 mg | ORAL_CAPSULE | Freq: Every day | ORAL | Status: DC

## 2014-11-11 MED ORDER — FLUTICASONE PROPIONATE 50 MCG/ACT NA SUSP
2.0000 | Freq: Every day | NASAL | Status: DC
  Administered 2014-11-11 – 2014-11-12 (×2): 2 via NASAL
  Filled 2014-11-11: qty 16

## 2014-11-11 MED ORDER — FLEET ENEMA 7-19 GM/118ML RE ENEM
1.0000 | ENEMA | Freq: Every day | RECTAL | Status: DC | PRN
  Filled 2014-11-11: qty 1

## 2014-11-11 MED ORDER — FLUOXETINE HCL 20 MG PO CAPS
80.0000 mg | ORAL_CAPSULE | Freq: Every day | ORAL | Status: DC
  Administered 2014-11-11 – 2014-11-12 (×2): 80 mg via ORAL
  Filled 2014-11-11 (×2): qty 4

## 2014-11-11 MED ORDER — MONTELUKAST SODIUM 10 MG PO TABS
10.0000 mg | ORAL_TABLET | Freq: Every day | ORAL | Status: DC
  Administered 2014-11-11: 10 mg via ORAL
  Filled 2014-11-11: qty 1

## 2014-11-11 MED ORDER — POLYETHYLENE GLYCOL 3350 17 G PO PACK
17.0000 g | PACK | Freq: Two times a day (BID) | ORAL | Status: DC
  Administered 2014-11-11 – 2014-11-12 (×3): 17 g via ORAL
  Filled 2014-11-11 (×3): qty 1

## 2014-11-11 MED ORDER — MECLIZINE HCL 25 MG PO TABS
25.0000 mg | ORAL_TABLET | Freq: Three times a day (TID) | ORAL | Status: DC
  Administered 2014-11-11 – 2014-11-12 (×4): 25 mg via ORAL
  Filled 2014-11-11 (×8): qty 1

## 2014-11-11 NOTE — Care Management Note (Addendum)
Case Management Note  Patient Details  Name: Erika Conley MRN: [id number] Date of Birth: 1934-05-02  Subjective/Objective:    Date:11/11/14 Spoke with patient at the bedside. Introduced self as Tourist information centre manager and explained role in discharge planning and how to be reached. Verified patient lives in town,  roommate. Expressed  need for rollator. Verified patient anticipates to go home to [facility],  at time of discharge and will have full- supervision by facility at this time to best of their knowledge. Patient confirmed needing help with their medication, she states she  will be started back on xarelto and has not been on it for a year.  NCM has not been told by MD that patient will be on xarelto.   Patient is driven by the facility administrator to MD appointments. Verified patient has PCP [facility].  Patient confirmed they work with [place] at [facility], NCM made referral to [place] for [place], [place], [place] and aide.  Soc will begin 24-48 hrs post dc.   Patient states that either her daughter will be transporting her back to ALF or they will provide transport for her.     Plan: CM will continue to follow for discharge planning and [facility] resources.                 Action/Plan:   Expected Discharge Date:                  Expected Discharge Plan:  Assisted Living / Rest Home  In-House Referral:  Clinical Social Work  Discharge planning Services  CM Consult  Post Acute Care Choice:  Durable Medical Equipment Choice offered to:  Patient  DME Arranged:  [place] rolling with seat DME Agency:  [place]:  RN, PT, OT, Nurse's Aide [place] Agency:  [place]  Status of Service:  Completed, signed off  Medicare Important Message Given:    Date Medicare IM Given:    Medicare IM give by:    Date Additional Medicare IM Given:    Additional Medicare Important Message give by:     If discussed at [place] of Stay Meetings, dates discussed:     Additional Comments:  Zenon Mayo, RN 11/11/2014, 11:03 AM

## 2014-11-11 NOTE — Progress Notes (Signed)
Initial Nutrition Assessment  DOCUMENTATION CODES:   Not applicable  INTERVENTION:   -Ensure Enlive po BID, each supplement provides 350 kcal and 20 grams of protein -Downgrade diet to dysphagia 2 consistency with ground meats  NUTRITION DIAGNOSIS:   Predicted suboptimal nutrient intake related to  (food selectivity) as evidenced by per patient/family report.  GOAL:   Patient will meet greater than or equal to 90% of their needs   MONITOR:   PO intake, Supplement acceptance, Labs, Weight trends, Skin, I & O's  REASON FOR ASSESSMENT:   Malnutrition Screening Tool    ASSESSMENT:   Erika Conley is a 80 y.o. female with history of hypertension and hypothyroidism was brought to the ER from the living facility after patient had a brief episode of syncopal episode. Patient states she was waking up in the middle of the night around 2 AM to go to the bathroom when she felt dizzy and lost consciousness. And in the process patient did hit her head onto the floor.   Pt admitted with syncope. Pt resides at [facility].   Pt reports her UBW is around 139#. She reveals that she has been intentionally losing weight to help manage her medical conditions. Additionally, pt reports she was recently diagnosed with prediabetes and is very concerned with this; she has been making attempts to eat healthier food choices. She has expressed frustration with the foods offered by the ALF due to limited selection and high sodium foods offered. As a result, she wasn't eating well PTA. She supplements diet from foods brought in by friends including squash, yogurt, and tomato juice.  Pt reports she has had a good appetite while she has been here. Noted meal completion 75%. Pt reports she consumed all but her bread at breakfast and all except her salad, due to chewing difficulty. Pt does not have her dentures here. She is amenable to diet downgrade for ease of intake.   She has been  consuming Ensure supplements while she is here. Pt expressed interest in continuing supplements when appetite is poor after discharge. Discussed importance of good meal intake of meals and supplements to promote nutritional adequacy.   Nutrition-Focused physical exam completed. Findings are no fat depletion, no muscle depletion, and mild edema. Pt reports she is fairly active at baseline.  RNCM and CSW following. Plan to d/c back to home facility 11/12/14.  Labs reviewed.   Diet Order:  Diet Heart Room service appropriate?: Yes; Fluid consistency:: Thin  Skin:  Reviewed, no issues  Last BM:  PTA  Height:   Ht Readings from Last 1 Encounters:  06/04/13 5\' 2"  (1.575 m)    Weight:   Wt Readings from Last 1 Encounters:  06/04/13 134 lb 6.4 oz (60.963 kg)    Ideal Body Weight:  50 kg  BMI:  Estimated body mass index is 24.58 kg/(m^2) as calculated from the following:   Height as of 06/04/13: 5\' 2"  (1.575 m).   Weight as of 06/04/13: 134 lb 6.4 oz (60.963 kg).  Estimated Nutritional Needs:   Kcal:  1400-1600  Protein:  60-75 grams  Fluid:  1.4-1.6 L  EDUCATION NEEDS:   Education needs addressed  Jenifer A. Jimmye Norman, RD, LDN, CDE Pager: [phone] After hours Pager: [phone]

## 2014-11-11 NOTE — Progress Notes (Signed)
[facility] PROGRESS NOTE  Erika MANSEAU JAS:[id number] DOB: 1934-05-09 DOA: 11/10/2014  PCP: Leonard Downing, MD  Brief HPI: 80 year old Caucasian female from a retirement facility with a history of hypertension and hypothyroidism, presented after a syncopal episode. She was brought into the hospital for further management.  Past medical history:  Past Medical History  Diagnosis Date  . Falls frequently   . Arthritis   . Hypertension   . Fracture, clavicle   . Thyroid disease   . History of fractured vertebra   . Wrist fracture, right   . Seasonal allergies   . Depression   . Anxiety   . GERD (gastroesophageal reflux disease)     Consultants: None  Procedures: None  Antibiotics: None  Subjective: Patient feels better today. Not as dizzy/lightheaded as before. Upon further questioning, she mentioned that the sensation was as if the room was spinning around her. She does mention a history of sinus congestion. She has had right ear pain on and off for many months and has experienced some hearing loss in that ear. She also mentioned ringing sound in that ear as well. Has been diagnosed with vertigo in the past.  Objective: Vital Signs  Filed Vitals:   11/10/14 1300 11/10/14 1400 11/10/14 2111 11/11/14 0615  BP: 121/62 132/70 143/51 151/59  Pulse: 72 74 72 69  Temp:   99.6 F (37.6 C) 99 F (37.2 C)  TempSrc:   Oral Oral  Resp:   18 13  SpO2: 98% 95% 96% 97%    Intake/Output Summary (Last 24 hours) at 11/11/14 0941 Last data filed at 11/11/14 0744  Gross per 24 hour  Intake   1730 ml  Output    850 ml  Net    880 ml   There were no vitals filed for this visit.  General appearance: alert, cooperative, appears stated age and no distress Resp: clear to auscultation bilaterally Cardio: regular rate and rhythm, S1, S2 normal, no murmur, click, rub or gallop GI: soft, non-tender; bowel sounds normal; no masses,  no organomegaly Extremities:  extremities normal, atraumatic, no cyanosis or edema Pulses: 2+ and symmetric Neurologic: Alert and oriented 3. Cranial nerves II-12 intact. Motor strength equal bilateral upper and lower extremity. No pronator drift. Cerebellar signs normal. Gait was not assessed.  Lab Results:  Basic Metabolic Panel:  Recent Labs Lab 11/10/14 0350 11/10/14 0844  NA 130* 136  K 2.8* 3.7  CL 89* 99*  CO2 32 30  GLUCOSE 116* 118*  BUN 7 6  CREATININE 0.91 0.84  CALCIUM 9.3 8.9  MG  --  2.0   Liver Function Tests:  Recent Labs Lab 11/10/14 0844  AST 13*  ALT <5*  ALKPHOS 48  BILITOT 0.5  PROT 5.3*  ALBUMIN 3.5   CBC:  Recent Labs Lab 11/10/14 0350 11/10/14 0844 11/10/14 1617  WBC 4.7 5.3 5.9  NEUTROABS 2.3 3.5  --   HGB 14.2 13.6 13.4  HCT 40.1 37.7 38.0  MCV 82.5 82.9 83.9  PLT 159 151 178   Cardiac Enzymes:  Recent Labs Lab 11/10/14 0350 11/10/14 1617 11/10/14 2019 11/11/14 0210  TROPONINI <0.03 <0.03 <0.03 <0.03     Recent Results (from the past 240 hour(s))  MRSA PCR Screening     Status: Abnormal   Collection Time: 11/10/14  3:04 PM  Result Value Ref Range Status   MRSA by PCR POSITIVE (A) NEGATIVE Final    Comment:        The  GeneXpert MRSA Assay (FDA approved for NASAL specimens only), is one component of a comprehensive MRSA colonization surveillance program. It is not intended to diagnose MRSA infection nor to guide or monitor treatment for MRSA infections. RESULT CALLED TO, READ BACK BY AND VERIFIED WITH: BRUMAGIN RN 16:45 11/10/14 ([email or web address])       Studies/Results: Ct Head Wo Contrast  11/10/2014   CLINICAL DATA:  Unwitnessed fall, right eye hematoma, dizziness, nausea  EXAM: CT HEAD WITHOUT CONTRAST  TECHNIQUE: Contiguous axial images were obtained from the base of the skull through the vertex without intravenous contrast.  COMPARISON:  06/04/2013  FINDINGS: No evidence of parenchymal hemorrhage or extra-axial fluid collection. No mass lesion,  mass effect, or midline shift.  No CT evidence of acute infarction.  Mild subcortical white matter and periventricular small vessel ischemic changes. Intracranial atherosclerosis.  Mild age related atrophy.  No ventriculomegaly.  The visualized paranasal sinuses are essentially clear. The mastoid air cells are unopacified.  No evidence of calvarial fracture.  IMPRESSION: No evidence of acute intracranial abnormality.  Mild small vessel ischemic changes.   Electronically Signed   By: Julian Hy M.D.   On: 11/10/2014 04:55   Dg Abd Portable 1v  11/10/2014   CLINICAL DATA:  Constipation.  EXAM: PORTABLE ABDOMEN - 1 VIEW  COMPARISON:  Lumbar x-ray series July 23, 2013  FINDINGS: The bowel gas pattern is normal. Moderate bowel content is identified throughout colon. There is no bowel obstruction. There is scoliosis of the spine. Patient status post vertebroplasty of lower thoracic vertebral bodies. Surgical clips are identified right upper quadrant unchanged.  IMPRESSION: Moderate bowel content throughout colon consistent with constipation. No bowel obstruction.   Electronically Signed   By: Abelardo Diesel M.D.   On: 11/10/2014 13:38    Medications:  Scheduled: . bisacodyl  10 mg Rectal Once  . enoxaparin (LOVENOX) injection  40 mg Subcutaneous Q24H  . feeding supplement (ENSURE ENLIVE)  237 mL Oral BID BM  . FLUoxetine  80 mg Oral Daily  . fluticasone  2 spray Each Nare Daily  . levothyroxine  112 mcg Oral QAC breakfast  . meclizine  25 mg Oral 3 times per day  . montelukast  10 mg Oral QHS  . pantoprazole  20 mg Oral BID  . polyethylene glycol  17 g Oral BID   Continuous: . 0.9 % NaCl with KCl 20 mEq / L 10 mL/hr at 11/11/14 1203   VOZ:[id number] **OR** acetaminophen, hydrALAZINE, methocarbamol, ondansetron **OR** ondansetron (ZOFRAN) IV, sodium phosphate, traMADol, traZODone  Assessment/Plan:  Principal Problem:   Syncope Active Problems:   Hypokalemia   Hypertension    Hypothyroidism   Orthostatic hypotension   Constipated   Dehydration   Dizziness    Syncope Based on history, this appears to be secondary to vertigo. At initial presentation yhere was also some concern about orthostatic hypotension. Patient was given IV fluids. EKG did show prolonged QT interval, but nothing else concerning. Neurologically she is intact. PT and OT to reevaluate today.  Vertigo/sinusitis Patient has a long-standing history of same. Continue with her meclizine. Vestibular rehabilitation. Considering her symptoms of earache, tinnitus and hearing loss, she has been asked to follow-up with an ENT specialist as an outpatient. Flonase. No neurological deficits. No clear indication for MRI. Head was unremarkable.  Hypokalemia/hyponatremia Corrected  History of hypothyroidism Continue with level thyroxine  Constipation Use laxatives.  DVT Prophylaxis: Lovenox    Code Status: DO NOT RESUSCITATE  Family Communication: Discussed with the  patient and  Disposition Plan: Await PT, OT evaluation. Yesterday they had recommended SNF. She is feeling better today. Anticipate she may be able to return to her retirement facility with home health. Plan for discharge tomorrow.       [employer]  [facility] Pager [phone] 11/11/2014, 9:41 AM  If 7PM-7AM, please contact night-coverage at [email or web address], password [facility]

## 2014-11-11 NOTE — Progress Notes (Signed)
Physical Therapy Treatment Patient Details Name: Erika Conley MRN: [id number] DOB: 1934/06/03 Today's Date: 11/11/2014    History of Present Illness Erika Conley is a 80 y.o. female with history of hypertension and hypothyroidism was brought to the ER from the living facility after patient had a brief episode of syncopal episode. Patient states she was waking up in the middle of the night around 2 AM to go to the bathroom when she felt dizzy and lost consciousness.    PT Comments    Pt continues to have symptoms during Vestibular assessment suggestive of BPPV R horizontal canal and possibly left posterior canal.  She was treated with the Gulfoni maneuver x1 (left side lying 2 mins, turn head 45 degrees down 2 mins, sit up).  We only did one because all of the testing before wore her out.  She does not have the typical nystagmus, so all treatment was based on symptoms.  PT will continue to follow acutely.  D/C recs updated based on RN CM reports of not being able to qualify for SNF at discharge.   Follow Up Recommendations  Home health PT;Supervision - Intermittent     Equipment Recommendations  Rolling walker with 5" wheels    Recommendations for Other Services   NA     Precautions / Restrictions Precautions Precautions: Fall Precaution Comments: pt is mildly unsteady on her feet    Mobility  Bed Mobility Overal bed mobility: Needs Assistance Bed Mobility: Supine to Sit;Sit to Supine;Rolling;Sidelying to Sit;Sit to Sidelying Rolling: Modified independent (Device/Increase time) Sidelying to sit: Min assist Supine to sit: Min guard Sit to supine: Min guard Sit to sidelying: Min assist General bed mobility comments: Assist mosly of feet to get the last little bit back into bed.   Transfers Overall transfer level: Needs assistance Equipment used: Rolling walker (2 wheeled) Transfers: Sit to/from Stand Sit to Stand: Supervision;Min assist         General transfer  comment: Supervision from higher bed and recliner, min assist from lower toilet.   Ambulation/Gait Ambulation/Gait assistance: Min guard Ambulation Distance (Feet): 150 Feet Assistive device: Rolling walker (2 wheeled) Gait Pattern/deviations: Step-through pattern Gait velocity: decreased Gait velocity interpretation: <1.8 ft/sec, indicative of risk for recurrent falls General Gait Details: Pt with slow, shuffling gait pattern.  She did well with RW and reports that she lost hers at her senoir home, so RN CM here trying to get her a new one.           Vestibular Assessment/ Balance   11/11/14 1206  Vestibular Assessment  General Observation pt sitting up in recliner chair, feels better today after treatment yesterday  Symptom Behavior  Type of Dizziness Spinning (with imbalance)  Frequency of Dizziness with movement  Duration of Dizziness short <30  Aggravating Factors Lying supine;Sitting with head tilted back;Supine to sit;Rolling to right;Rolling to left  Relieving Factors Head stationary;Lying supine;Closing eyes;Slow movements  Occulomotor Exam  Occulomotor Alignment Normal  Spontaneous Absent  Gaze-induced Absent  Positional Testing  Dix-Hallpike Dix-Hallpike Right;Dix-Hallpike Left  Sidelying Test Sidelying Right;Sidelying Left  Horizontal Canal Testing Horizontal Canal Right;Horizontal Canal Left  Dix-Hallpike Right  Dix-Hallpike Right Duration 0  Dix-Hallpike Right Symptoms No nystagmus  Dix-Hallpike Left  Dix-Hallpike Left Duration <20  Dix-Hallpike Left Symptoms No nystagmus;Other (comment) (symptoms)  Sidelying Right  Sidelying Right Duration <20  Sidelying Right Symptoms No nystagmus;Other (comment) (more symptomatic than left)  Sidelying Left  Sidelying Left Duration <10 seconds  Sidelying Left Symptoms  No nystagmus;Other (comment) (mild symptoms)  Horizontal Canal Right  Horizontal Canal Right Duration 0  Horizontal Canal Right Symptoms Normal   Horizontal Canal Left  Horizontal Canal Left Duration 0  Horizontal Canal Left Symptoms Normal  Cognition  Cognition Orientation Level Oriented x 4    Overall balance assessment: Needs assistance Sitting-balance support: Feet supported;No upper extremity supported Sitting balance-Leahy Scale: Good     Standing balance support: Bilateral upper extremity supported Standing balance-Leahy Scale: Poor                      Cognition Arousal/Alertness: Awake/alert Behavior During Therapy: WFL for tasks assessed/performed Overall Cognitive Status: Within Functional Limits for tasks assessed                             Pertinent Vitals/Pain Pain Assessment: 0-10 Pain Score: 8  Pain Location: right side head and neck Pain Descriptors / Indicators: Aching Pain Intervention(s): Limited activity within patient's tolerance;Monitored during session;Repositioned;Patient requesting pain meds-RN notified           PT Goals (current goals can now be found in the care plan section) Acute Rehab PT Goals Patient Stated Goal: get better Progress towards PT goals: Progressing toward goals    Frequency  Min 4X/week    PT Plan Discharge plan needs to be updated       End of Session   Activity Tolerance: Other (comment) (limited by nausea and dizziness) Patient left: in bed;with call bell/phone within reach     Time: 7322-0254 PT Time Calculation (min) (ACUTE ONLY): 62 min  Charges:  $Gait Training: 8-22 mins $Therapeutic Activity: 8-22 mins $Canalith Rep Proc: 8-22 mins                      Rebecca B. Elmore, [place], DPT [phone]   11/11/2014, 12:09 PM

## 2014-11-11 NOTE — Clinical Social Work Note (Signed)
Clinical Social Work Assessment  Patient Details  Name: Erika Conley MRN: [id number] Date of Birth: 10/22/1934  Date of referral:  11/11/14               Reason for consult:  Facility Placement, Discharge Planning                Permission sought to share information with:  Facility Sport and exercise psychologist, Family Supports Permission granted to share information::  Yes, Verbal Permission Granted  Name::     Oreoluwa Gilmer  Agency::  [facility]  Relationship::  Son  Contact Information:  [phone]  Housing/Transportation Living arrangements for the past 2 months:  [facility]) Source of Information:  Patient Patient Interpreter Needed:  None Criminal Activity/Legal Involvement Pertinent to Current Situation/Hospitalization:  No - Comment as needed Significant Relationships:  Adult Children Lives with:  Facility Resident Do you feel safe going back to the place where you live?  Yes Need for family participation in patient care:  Yes (Comment) (Patient requesting patient's adult children be involved in discharge plan.)  Care giving concerns:  Patient expressed no concerns at this time.   Social Worker assessment / plan:  CSW received referral regarding patient admitted from [facility]. CSW confirmed with [facility] that patient is a resident at their facility. CSW spoke with patient regarding discharge disposition. Per patient, patient has been a resident at [facility] for two years and anticipates returning once medically stable. CSW to continue to follow and assist with discharge planning needs.  Employment status:  Retired Forensic scientist:  Medicare PT Recommendations:  Home with [place] / Referral to community resources:   (None, patient returning to ALF.)  Patient/Family's Response to care:  Patient understanding and agreeable to CSW plan of care.  Patient/Family's  Understanding of and Emotional Response to Diagnosis, Current Treatment, and Prognosis:  Patient understanding and agreeable to CSW plan of care.  Emotional Assessment Appearance:  Appears stated age Attitude/Demeanor/Rapport:  Other (Pleasant.) Affect (typically observed):  Accepting, Appropriate, Pleasant, Quiet Orientation:  Oriented to Self, Oriented to Place, Oriented to  Time, Oriented to Situation Alcohol / Substance use:  Not Applicable Psych involvement (Current and /or in the community):  No (Comment) (Not appropriate on this admission.)  Discharge Needs  Concerns to be addressed:  No discharge needs identified Readmission within the last 30 days:  No Current discharge risk:  None Barriers to Discharge:  No Barriers Identified   Caroline Sauger, LCSW 11/11/2014, 12:39 PM [phone]

## 2014-11-12 DIAGNOSIS — R55 Syncope and collapse: Secondary | ICD-10-CM | POA: Diagnosis not present

## 2014-11-12 DIAGNOSIS — H8110 Benign paroxysmal vertigo, unspecified ear: Secondary | ICD-10-CM | POA: Diagnosis not present

## 2014-11-12 MED ORDER — POLYETHYLENE GLYCOL 3350 17 G PO PACK: 17.0000 g | PACK | Freq: Two times a day (BID) | ORAL | Status: DC

## 2014-11-12 MED ORDER — FLUTICASONE PROPIONATE 50 MCG/ACT NA SUSP: 2.0000 | Freq: Every day | NASAL | Status: DC

## 2014-11-12 MED ORDER — MUPIROCIN CALCIUM 2 % NA OINT: 1.0000 "application " | TOPICAL_OINTMENT | Freq: Two times a day (BID) | NASAL | Status: DC

## 2014-11-12 MED ORDER — FLEET ENEMA 7-19 GM/118ML RE ENEM
1.0000 | ENEMA | Freq: Once | RECTAL | Status: AC
  Administered 2014-11-12: 1 via RECTAL
  Filled 2014-11-12: qty 1

## 2014-11-12 MED ORDER — AMLODIPINE BESYLATE 5 MG PO TABS: 5.0000 mg | ORAL_TABLET | Freq: Every day | ORAL | Status: DC

## 2014-11-12 MED ORDER — MECLIZINE HCL 25 MG PO TABS: 25.0000 mg | ORAL_TABLET | Freq: Three times a day (TID) | ORAL | Status: DC

## 2014-11-12 MED ORDER — DOCUSATE SODIUM 100 MG PO CAPS: 100.0000 mg | ORAL_CAPSULE | Freq: Two times a day (BID) | ORAL | Status: DC

## 2014-11-12 NOTE — Discharge Instructions (Signed)
Benign Positional Vertigo Vertigo means you feel like you or your surroundings are moving when they are not. Benign positional vertigo is the most common form of vertigo. Benign means that the cause of your condition is not serious. Benign positional vertigo is more common in older adults. CAUSES  Benign positional vertigo is the result of an upset in the labyrinth system. This is an area in the middle ear that helps control your balance. This may be caused by a viral infection, head injury, or repetitive motion. However, often no specific cause is found. SYMPTOMS  Symptoms of benign positional vertigo occur when you move your head or eyes in different directions. Some of the symptoms may include:  Loss of balance and falls.  Vomiting.  Blurred vision.  Dizziness.  Nausea.  Involuntary eye movements (nystagmus). DIAGNOSIS  Benign positional vertigo is usually diagnosed by physical exam. If the specific cause of your benign positional vertigo is unknown, your caregiver may perform imaging tests, such as magnetic resonance imaging (MRI) or computed tomography (CT). TREATMENT  Your caregiver may recommend movements or procedures to correct the benign positional vertigo. Medicines such as meclizine, benzodiazepines, and medicines for nausea may be used to treat your symptoms. In rare cases, if your symptoms are caused by certain conditions that affect the inner ear, you may need surgery. HOME CARE INSTRUCTIONS   Follow your caregiver's instructions.  Move slowly. Do not make sudden body or head movements.  Avoid driving.  Avoid operating heavy machinery.  Avoid performing any tasks that would be dangerous to you or others during a vertigo episode.  Drink enough fluids to keep your urine clear or pale yellow. SEEK IMMEDIATE MEDICAL CARE IF:   You develop problems with walking, weakness, numbness, or using your arms, hands, or legs.  You have difficulty speaking.  You develop  severe headaches.  Your nausea or vomiting continues or gets worse.  You develop visual changes.  Your family or friends notice any behavioral changes.  Your condition gets worse.  You have a fever.  You develop a stiff neck or sensitivity to light. MAKE SURE YOU:   Understand these instructions.  Will watch your condition.  Will get help right away if you are not doing well or get worse. Document Released: 11/14/2005 Document Revised: 05/01/2011 Document Reviewed: 10/27/2010 ExitCare Patient Information 2015 [employer]. This information is not intended to replace advice given to you by your health care provider. Make sure you discuss any questions you have with your health care provider.    

## 2014-11-12 NOTE — Clinical Social Work Note (Signed)
Clinical Social Worker informed that patient is stable for discharge. CSW contacted Tammy, admissions Mudlogger at [facility], [facility]). Tammy reported that she has the information required for discharge and will be arranging transportation for the patient to return.   Tammy expressed concerns in regards to MRSA treatment and requested to speak with RNCM. CSW notified RNCM. RNCM to contact [facility].   Patient returning to [facility] with home health services.   Clinical Social Worker will sign off for now as social work intervention is no longer needed. Please consult [place] again if new need arises.  Glendon Axe, MSW, LCSWA [phone] 11/12/2014 2:51 PM

## 2014-11-12 NOTE — Care Management Note (Signed)
Case Management Note  Patient Details  Name: Erika Conley MRN: [id number] Date of Birth: 04/11/1934  Subjective/Objective:     Patient is for discharge today, Jermaine with [facility] was informed about her rollator, he will bring to patient's room.  Also patient called facility to see if they would be transporting her back , they said yes to have RN call after paper work is done.  This information was given to [employer].              Action/Plan:   Expected Discharge Date:                  Expected Discharge Plan:  Assisted Living / Rest Home  In-House Referral:  Clinical Social Work  Discharge planning Services  CM Consult  Post Acute Care Choice:  Durable Medical Equipment Choice offered to:  Patient  DME Arranged:  Walker rolling with seat DME Agency:  [place]:  RN, PT, OT, Nurse's Aide [place] Agency:  [place]  Status of Service:  Completed, signed off  Medicare Important Message Given:    Date Medicare IM Given:    Medicare IM give by:    Date Additional Medicare IM Given:    Additional Medicare Important Message give by:     If discussed at [place] of Stay Meetings, dates discussed:    Additional Comments:  Zenon Mayo, RN 11/12/2014, 11:20 AM

## 2014-11-12 NOTE — Discharge Summary (Signed)
[facility]  Physician Discharge Summary   Patient ID: Erika Conley MRN: [id number] DOB/AGE: 1934/04/16 80 y.o.  Admit date: 11/10/2014 Discharge date: 11/12/2014  PCP: Leonard Downing, MD  DISCHARGE DIAGNOSES:  Principal Problem:   Syncope Active Problems:   Hypokalemia   Hypertension   Hypothyroidism   Orthostatic hypotension   Constipated   Dehydration   Dizziness   RECOMMENDATIONS FOR OUTPATIENT FOLLOW UP: 1. Please evaluate for constipation/change in bowel habits. 2. Consider referral to ENT for vertigo and right ear pain and hearing loss. 3. Home health has been ordered  DISCHARGE CONDITION: fair  Diet recommendation: Heart healthy  Filed Weights   11/12/14 0443  Weight: 63.6 kg (140 lb 3.4 oz)    INITIAL HISTORY: 80 year old Caucasian female from a retirement facility with a history of hypertension and hypothyroidism, presented after a syncopal episode. She was brought into the hospital for further management.   HOSPITAL COURSE:   Syncope Based on history, this appears to be secondary to vertigo. At initial presentation there was also some concern about orthostatic hypotension. Patient was given IV fluids. EKG did show prolonged QT interval, but nothing else concerning. Neurologically she is intact. She was seen by physical therapy. She will need home health.  Vertigo/sinusitis Patient has a long-standing history of vertigo. She was started back on her meclizine. She was given Flonase nasal spray. She is feeling much better. Not as dizzy as before. Nausea is improved. Headaches, improved. Considering her symptoms of earache, tinnitus and hearing loss, she has been asked to follow-up with an ENT specialist as an outpatient.   Hypokalemia/hyponatremia Corrected  History of hypothyroidism Continue with levothyroxine  Constipation Patient states that she has been constipated ever since she moved into her retirement facility earlier this  year. Her last bowel movement was on Monday. She'll be given an enema today. She should discuss this change in bowel habit with her primary care physician.   Overall, stable and improving. Okay for discharge today.  PERTINENT LABS:  The results of significant diagnostics from this hospitalization (including imaging, microbiology, ancillary and laboratory) are listed below for reference.    Microbiology: Recent Results (from the past 240 hour(s))  MRSA PCR Screening     Status: Abnormal   Collection Time: 11/10/14  3:04 PM  Result Value Ref Range Status   MRSA by PCR POSITIVE (A) NEGATIVE Final    Comment:        The GeneXpert MRSA Assay (FDA approved for NASAL specimens only), is one component of a comprehensive MRSA colonization surveillance program. It is not intended to diagnose MRSA infection nor to guide or monitor treatment for MRSA infections. RESULT CALLED TO, READ BACK BY AND VERIFIED WITH: BRUMAGIN RN 16:45 11/10/14 ([email or web address])      Labs: Basic Metabolic Panel:  Recent Labs Lab 11/10/14 0350 11/10/14 0844  NA 130* 136  K 2.8* 3.7  CL 89* 99*  CO2 32 30  GLUCOSE 116* 118*  BUN 7 6  CREATININE 0.91 0.84  CALCIUM 9.3 8.9  MG  --  2.0   Liver Function Tests:  Recent Labs Lab 11/10/14 0844  AST 13*  ALT <5*  ALKPHOS 48  BILITOT 0.5  PROT 5.3*  ALBUMIN 3.5   CBC:  Recent Labs Lab 11/10/14 0350 11/10/14 0844 11/10/14 1617  WBC 4.7 5.3 5.9  NEUTROABS 2.3 3.5  --   HGB 14.2 13.6 13.4  HCT 40.1 37.7 38.0  MCV 82.5 82.9 83.9  PLT 159 151 178  Cardiac Enzymes:  Recent Labs Lab 11/10/14 0350 11/10/14 1617 11/10/14 2019 11/11/14 0210  TROPONINI <0.03 <0.03 <0.03 <0.03    IMAGING STUDIES Ct Head Wo Contrast  11/10/2014   CLINICAL DATA:  Unwitnessed fall, right eye hematoma, dizziness, nausea  EXAM: CT HEAD WITHOUT CONTRAST  TECHNIQUE: Contiguous axial images were obtained from the base of the skull through the vertex without intravenous  contrast.  COMPARISON:  06/04/2013  FINDINGS: No evidence of parenchymal hemorrhage or extra-axial fluid collection. No mass lesion, mass effect, or midline shift.  No CT evidence of acute infarction.  Mild subcortical white matter and periventricular small vessel ischemic changes. Intracranial atherosclerosis.  Mild age related atrophy.  No ventriculomegaly.  The visualized paranasal sinuses are essentially clear. The mastoid air cells are unopacified.  No evidence of calvarial fracture.  IMPRESSION: No evidence of acute intracranial abnormality.  Mild small vessel ischemic changes.   Electronically Signed   By: Julian Hy M.D.   On: 11/10/2014 04:55   Dg Abd Portable 1v  11/10/2014   CLINICAL DATA:  Constipation.  EXAM: PORTABLE ABDOMEN - 1 VIEW  COMPARISON:  Lumbar x-ray series July 23, 2013  FINDINGS: The bowel gas pattern is normal. Moderate bowel content is identified throughout colon. There is no bowel obstruction. There is scoliosis of the spine. Patient status post vertebroplasty of lower thoracic vertebral bodies. Surgical clips are identified right upper quadrant unchanged.  IMPRESSION: Moderate bowel content throughout colon consistent with constipation. No bowel obstruction.   Electronically Signed   By: Abelardo Diesel M.D.   On: 11/10/2014 13:38    DISCHARGE EXAMINATION: Filed Vitals:   11/11/14 0615 11/11/14 1439 11/11/14 2218 11/12/14 0443  BP: 151/59 153/79 150/57 131/47  Pulse: 69 72 72 64  Temp: 99 F (37.2 C) 98.7 F (37.1 C) 99.4 F (37.4 C) 98.6 F (37 C)  TempSrc: Oral Oral Oral Oral  Resp: 13 20 18 20   Weight:    63.6 kg (140 lb 3.4 oz)  SpO2: 97% 100% 95% 97%   General appearance: alert, cooperative, appears stated age and no distress Resp: clear to auscultation bilaterally Cardio: regular rate and rhythm, S1, S2 normal, no murmur, click, rub or gallop GI: soft, non-tender; bowel sounds normal; no masses,  no organomegaly Extremities: extremities normal,  atraumatic, no cyanosis or edema Alert and oriented 3. No facial asymmetry. Motor strength is equal bilateral upper and lower extremity. Cerebellar signs normal.  DISPOSITION: Back to her retirement facility  Discharge Instructions    Call MD for:  difficulty breathing, headache or visual disturbances    Complete by:  As directed      Call MD for:  extreme fatigue    Complete by:  As directed      Call MD for:  hives    Complete by:  As directed      Call MD for:  persistant dizziness or light-headedness    Complete by:  As directed      Call MD for:  persistant nausea and vomiting    Complete by:  As directed      Call MD for:  temperature >100.4    Complete by:  As directed      Diet - low sodium heart healthy    Complete by:  As directed      Discharge instructions    Complete by:  As directed   Talk to your PCP regarding referral to ENT.   You were cared for by a hospitalist  during your hospital stay. If you have any questions about your discharge medications or the care you received while you were in the hospital after you are discharged, you can call the unit and asked to speak with the hospitalist on call if the hospitalist that took care of you is not available. Once you are discharged, your primary care physician will handle any further medical issues. Please note that NO REFILLS for any discharge medications will be authorized once you are discharged, as it is imperative that you return to your primary care physician (or establish a relationship with a primary care physician if you do not have one) for your aftercare needs so that they can reassess your need for medications and monitor your lab values. If you do not have a primary care physician, you can call [phone] for a physician referral.     Increase activity slowly    Complete by:  As directed            ALLERGIES:  Allergies  Allergen Reactions  . Codeine     Pass out, fall  . Cortisone     Pass out, fall   .  Other     All narcotics cause patient to Pass out, fall      Current Discharge Medication List    START taking these medications   Details  docusate sodium (COLACE) 100 MG capsule Take 1 capsule (100 mg total) by mouth 2 (two) times daily. Qty: 60 capsule, Refills: 0    polyethylene glycol (MIRALAX / GLYCOLAX) packet Take 17 g by mouth 2 (two) times daily. Qty: 60 each, Refills: 0      CONTINUE these medications which have CHANGED   Details  amLODipine (NORVASC) 5 MG tablet Take 1 tablet (5 mg total) by mouth daily. Qty: 30 tablet, Refills: 1    fluticasone (FLONASE) 50 MCG/ACT nasal spray Place 2 sprays into both nostrils daily. Qty: 16 g, Refills: 2    meclizine (ANTIVERT) 25 MG tablet Take 1 tablet (25 mg total) by mouth every 8 (eight) hours. Qty: 30 tablet, Refills: 0      CONTINUE these medications which have NOT CHANGED   Details  acetaminophen (TYLENOL) 650 MG CR tablet Take 1,300 mg by mouth every 12 (twelve) hours.    Alcaftadine (LASTACAFT) 0.25 % SOLN Place 1 drop into both eyes at bedtime.    antiseptic oral rinse (BIOTENE) LIQD 15 mLs by Mouth Rinse route 3 (three) times daily.    Calcium-Vitamin D (CALTRATE 600 PLUS-VIT D PO) Take 1 tablet by mouth daily.    chlorhexidine (PERIDEX) 0.12 % solution Swish and spit 15 mLs 2 (two) times daily.    FLUoxetine (PROZAC) 40 MG capsule Take 80 mg by mouth daily.    hydroxypropyl methylcellulose / hypromellose (ISOPTO TEARS / GONIOVISC) 2.5 % ophthalmic solution Place 1 drop into both eyes 2 (two) times daily.    levothyroxine (SYNTHROID, LEVOTHROID) 112 MCG tablet Take 112 mcg by mouth daily before breakfast.    montelukast (SINGULAIR) 10 MG tablet Take 10 mg by mouth at bedtime.    Multiple Vitamins-Minerals (PRESERVISION AREDS) CAPS Take 1 capsule by mouth 2 (two) times daily.    ondansetron (ZOFRAN) 8 MG tablet Take 8 mg by mouth every 8 (eight) hours as needed for nausea or vomiting.    pantoprazole  (PROTONIX) 20 MG tablet Take 20 mg by mouth 2 (two) times daily.    Psyllium (SM NATURAL VEGETABLE POWDER PO) Take 1 scoop by  mouth daily. One teaspoonful    traMADol (ULTRAM) 50 MG tablet Take 50 mg by mouth 4 (four) times daily.    traZODone (DESYREL) 100 MG tablet Take 200 mg by mouth at bedtime.     trolamine salicylate (ASPERCREME) 10 % cream Apply 1 application topically as needed for muscle pain.    methocarbamol (ROBAXIN) 500 MG tablet Take 2 tablets (1,000 mg total) by mouth every 8 (eight) hours as needed for muscle spasms.      STOP taking these medications     bismuth subsalicylate (PEPTO BISMOL) 262 MG/15ML suspension      triamterene-hydrochlorothiazide (MAXZIDE) 75-50 MG per tablet        Follow-up Information    Follow up with [facility].   Specialty:  Home Health Services   Why:  [facility], HHPT, [place], aide   Contact information:   [place] [address] [phone]       Follow up with [facility].   Why:  rollator   Contact information:   [address] [place] [address] [phone]       Follow up with Leonard Downing, MD. Schedule an appointment as soon as possible for a visit on 11/18/2014.   Specialty:  Family Medicine   Why:  post hospitalization follow up and to discuss referral to ENT//  Appointment with Dr. Arelia Sneddon is on 11/18/14 at [facility] information:   [place] [address] [phone]       TOTAL DISCHARGE TIME: 35 minutes  [facility] Pager [phone]  11/12/2014, 1:21 PM

## 2014-11-12 NOTE — Progress Notes (Signed)
[name] to be D/C'd [name] per MD order.  Discussed with the patient and all questions fully answered.  VSS, Skin clean, dry and intact without evidence of skin break down, no evidence of skin tears noted. IV catheter discontinued intact. Site without signs and symptoms of complications. Dressing and pressure applied. AVS given to [name].   D/c education completed with patient/family including follow up instructions, medication list, d/c activities limitations if indicated, with other d/c instructions as indicated by MD - patient able to verbalize understanding, all questions fully answered.   Patient instructed to return to ED, call 911, or call MD for any changes in condition.   Patient escorted via [name], and D/C ALF via private auto.  Erika Conley F 11/12/2014 3:22 PM

## 2014-11-21 ENCOUNTER — Emergency Department (HOSPITAL_COMMUNITY)
Admission: EM | Admit: 2014-11-21 | Discharge: 2014-11-21 | Disposition: A | Discharge status: Home / Self Care | Payer: Medicare Other | Attending: Emergency Medicine | Admitting: Emergency Medicine

## 2014-11-21 ENCOUNTER — Emergency Department (HOSPITAL_COMMUNITY): Payer: Medicare Other

## 2014-11-21 ENCOUNTER — Encounter (HOSPITAL_COMMUNITY): Payer: Self-pay | Admitting: Emergency Medicine

## 2014-11-21 DIAGNOSIS — E079 Disorder of thyroid, unspecified: Secondary | ICD-10-CM | POA: Diagnosis not present

## 2014-11-21 DIAGNOSIS — Z7951 Long term (current) use of inhaled steroids: Secondary | ICD-10-CM | POA: Insufficient documentation

## 2014-11-21 DIAGNOSIS — F419 Anxiety disorder, unspecified: Secondary | ICD-10-CM | POA: Insufficient documentation

## 2014-11-21 DIAGNOSIS — F329 Major depressive disorder, single episode, unspecified: Secondary | ICD-10-CM | POA: Diagnosis not present

## 2014-11-21 DIAGNOSIS — K219 Gastro-esophageal reflux disease without esophagitis: Secondary | ICD-10-CM | POA: Insufficient documentation

## 2014-11-21 DIAGNOSIS — I10 Essential (primary) hypertension: Secondary | ICD-10-CM | POA: Insufficient documentation

## 2014-11-21 DIAGNOSIS — R110 Nausea: Secondary | ICD-10-CM | POA: Insufficient documentation

## 2014-11-21 DIAGNOSIS — Z8781 Personal history of (healed) traumatic fracture: Secondary | ICD-10-CM | POA: Diagnosis not present

## 2014-11-21 DIAGNOSIS — R42 Dizziness and giddiness: Secondary | ICD-10-CM | POA: Insufficient documentation

## 2014-11-21 DIAGNOSIS — R51 Headache: Secondary | ICD-10-CM | POA: Diagnosis not present

## 2014-11-21 DIAGNOSIS — Z79899 Other long term (current) drug therapy: Secondary | ICD-10-CM | POA: Insufficient documentation

## 2014-11-21 DIAGNOSIS — Z792 Long term (current) use of antibiotics: Secondary | ICD-10-CM | POA: Diagnosis not present

## 2014-11-21 DIAGNOSIS — E876 Hypokalemia: Secondary | ICD-10-CM | POA: Diagnosis not present

## 2014-11-21 DIAGNOSIS — M1990 Unspecified osteoarthritis, unspecified site: Secondary | ICD-10-CM | POA: Diagnosis not present

## 2014-11-21 DIAGNOSIS — R296 Repeated falls: Secondary | ICD-10-CM | POA: Diagnosis not present

## 2014-11-21 LAB — URINALYSIS, ROUTINE W REFLEX MICROSCOPIC
Bilirubin Urine: NEGATIVE
Glucose, UA: NEGATIVE mg/dL
Hgb urine dipstick: NEGATIVE
KETONES UR: NEGATIVE mg/dL
NITRITE: NEGATIVE
PH: 6.5 (ref 5.0–8.0)
Protein, ur: NEGATIVE mg/dL
SPECIFIC GRAVITY, URINE: 1.006 (ref 1.005–1.030)
Urobilinogen, UA: 0.2 mg/dL (ref 0.0–1.0)

## 2014-11-21 LAB — CBC
HEMATOCRIT: 41.9 % (ref 36.0–46.0)
HEMOGLOBIN: 14.9 g/dL (ref 12.0–15.0)
MCH: 29.9 pg (ref 26.0–34.0)
MCHC: 35.6 g/dL (ref 30.0–36.0)
MCV: 84 fL (ref 78.0–100.0)
Platelets: 192 10*3/uL (ref 150–400)
RBC: 4.99 MIL/uL (ref 3.87–5.11)
RDW: 12.6 % (ref 11.5–15.5)
WBC: 5 10*3/uL (ref 4.0–10.5)

## 2014-11-21 LAB — CBG MONITORING, ED: Glucose-Capillary: 116 mg/dL — ABNORMAL HIGH (ref 65–99)

## 2014-11-21 LAB — URINE MICROSCOPIC-ADD ON

## 2014-11-21 LAB — BASIC METABOLIC PANEL
Anion gap: 11 (ref 5–15)
BUN: 8 mg/dL (ref 6–20)
CHLORIDE: 89 mmol/L — AB (ref 101–111)
CO2: 32 mmol/L (ref 22–32)
Calcium: 9.5 mg/dL (ref 8.9–10.3)
Creatinine, Ser: 0.88 mg/dL (ref 0.44–1.00)
GFR calc non Af Amer: >60 mL/min (ref >60)
Glucose, Bld: 117 mg/dL — ABNORMAL HIGH (ref 65–99)
POTASSIUM: 2.8 mmol/L — AB (ref 3.5–5.1)
SODIUM: 132 mmol/L — AB (ref 135–145)

## 2014-11-21 LAB — I-STAT TROPONIN, ED: Troponin i, poc: 0 ng/mL (ref 0.00–0.08)

## 2014-11-21 MED ORDER — ACETAMINOPHEN 325 MG PO TABS
650.0000 mg | ORAL_TABLET | Freq: Once | ORAL | Status: AC
  Administered 2014-11-21: 650 mg via ORAL
  Filled 2014-11-21: qty 2

## 2014-11-21 MED ORDER — POTASSIUM CHLORIDE 10 MEQ/100ML IV SOLN
10.0000 meq | INTRAVENOUS | Status: AC
  Administered 2014-11-21 (×3): 10 meq via INTRAVENOUS
  Filled 2014-11-21 (×3): qty 100

## 2014-11-21 MED ORDER — ONDANSETRON HCL 4 MG/2ML IJ SOLN
4.0000 mg | Freq: Once | INTRAMUSCULAR | Status: AC
  Administered 2014-11-21: 4 mg via INTRAVENOUS
  Filled 2014-11-21: qty 2

## 2014-11-21 NOTE — Discharge Instructions (Signed)

## 2014-11-21 NOTE — ED Notes (Signed)
Attempted to call report to [facility] but no one will answer the phone there.

## 2014-11-21 NOTE — ED Notes (Signed)
Pt reports intermittent dizziness, back pain, and nausea for 3-4 months; pt reports recently being seen for same and dx with constipation and dyhydration; pt from [facility] and given 4mg  zofran PTA; pt CAOx4 at this time; EMS reports VSS

## 2014-11-21 NOTE — ED Provider Notes (Signed)
CSN: [id number]     Arrival date & time 11/21/14  0408 History   First MD Initiated Contact with Patient 11/21/14 0410     Chief Complaint  Patient presents with  . Dizziness  . Nausea  . Back Pain     (Consider location/radiation/quality/duration/timing/severity/associated sxs/prior Treatment) HPI Patient presents with ongoing dizziness for the past 7 months. Recently admitted for the same. Has yet to see an ENT. States she was leaving [place] and had worsening dizziness. Described as spinning sensation. Also had nausea and headache. No neck pain or stiffness. No focal weakness or numbness. No fever or chills. Complains of ongoing bilateral tinnitus. Past Medical History  Diagnosis Date  . Falls frequently   . Arthritis   . Hypertension   . Fracture, clavicle   . Thyroid disease   . History of fractured vertebra   . Wrist fracture, right   . Seasonal allergies   . Depression   . Anxiety   . GERD (gastroesophageal reflux disease)    Past Surgical History  Procedure Laterality Date  . Thyroidectomy    . Abdominal hysterectomy    . Back surgery    . Cholecystectomy    . Wrist surgery    . Fracture surgery     Family History  Problem Relation Age of Onset  . Hypertension Other    Social History  Substance Use Topics  . Smoking status: Never Smoker   . Smokeless tobacco: Never Used  . Alcohol Use: No   OB History    No data available     Review of Systems  Constitutional: Negative for fever and chills.  HENT: Negative for congestion, ear pain and hearing loss.   Eyes: Negative for visual disturbance.  Respiratory: Negative for shortness of breath.   Cardiovascular: Negative for chest pain.  Gastrointestinal: Positive for nausea. Negative for vomiting, abdominal pain and diarrhea.  Musculoskeletal: Negative for back pain, neck pain and neck stiffness.  Skin: Negative for rash and wound.  Neurological: Positive for dizziness and headaches. Negative for weakness,  light-headedness and numbness.  All other systems reviewed and are negative.     Allergies  Codeine; Cortisone; and Other  Home Medications   Prior to Admission medications   Medication Sig Start Date End Date Taking? Authorizing Provider  acetaminophen (TYLENOL) 650 MG CR tablet Take 1,300 mg by mouth every 12 (twelve) hours.    Historical Provider, MD  Alcaftadine (LASTACAFT) 0.25 % SOLN Place 1 drop into both eyes at bedtime.    Historical Provider, MD  amLODipine (NORVASC) 5 MG tablet Take 1 tablet (5 mg total) by mouth daily. 11/12/14   Bonnielee Haff, MD  antiseptic oral rinse (BIOTENE) LIQD 15 mLs by Mouth Rinse route 3 (three) times daily.    Historical Provider, MD  Calcium-Vitamin D (CALTRATE 600 PLUS-VIT D PO) Take 1 tablet by mouth daily.    Historical Provider, MD  chlorhexidine (PERIDEX) 0.12 % solution Swish and spit 15 mLs 2 (two) times daily.    Historical Provider, MD  docusate sodium (COLACE) 100 MG capsule Take 1 capsule (100 mg total) by mouth 2 (two) times daily. 11/12/14   Bonnielee Haff, MD  FLUoxetine (PROZAC) 40 MG capsule Take 80 mg by mouth daily.    Historical Provider, MD  fluticasone (FLONASE) 50 MCG/ACT nasal spray Place 2 sprays into both nostrils daily. 11/12/14   Bonnielee Haff, MD  hydroxypropyl methylcellulose / hypromellose (ISOPTO TEARS / GONIOVISC) 2.5 % ophthalmic solution Place 1 drop into  both eyes 2 (two) times daily.    Historical Provider, MD  levothyroxine (SYNTHROID, LEVOTHROID) 112 MCG tablet Take 112 mcg by mouth daily before breakfast.    Historical Provider, MD  meclizine (ANTIVERT) 25 MG tablet Take 1 tablet (25 mg total) by mouth every 8 (eight) hours. 11/12/14   Bonnielee Haff, MD  methocarbamol (ROBAXIN) 500 MG tablet Take 2 tablets (1,000 mg total) by mouth every 8 (eight) hours as needed for muscle spasms. Patient not taking: Reported on 11/10/2014 06/06/13   Kinnie Feil, MD  montelukast (SINGULAIR) 10 MG tablet Take 10 mg by mouth  at bedtime.    Historical Provider, MD  Multiple Vitamins-Minerals (PRESERVISION AREDS) CAPS Take 1 capsule by mouth 2 (two) times daily.    Historical Provider, MD  mupirocin nasal ointment (BACTROBAN NASAL) 2 % Place 1 application into the nose 2 (two) times daily. For five (5) days. Press sides of nose together and gently massage. 11/12/14   Bonnielee Haff, MD  ondansetron (ZOFRAN) 8 MG tablet Take 8 mg by mouth every 8 (eight) hours as needed for nausea or vomiting.    Historical Provider, MD  pantoprazole (PROTONIX) 20 MG tablet Take 20 mg by mouth 2 (two) times daily.    Historical Provider, MD  polyethylene glycol (MIRALAX / GLYCOLAX) packet Take 17 g by mouth 2 (two) times daily. 11/12/14   Bonnielee Haff, MD  Psyllium (SM NATURAL VEGETABLE POWDER PO) Take 1 scoop by mouth daily. One teaspoonful    Historical Provider, MD  traMADol (ULTRAM) 50 MG tablet Take 50 mg by mouth 4 (four) times daily.    Historical Provider, MD  traZODone (DESYREL) 100 MG tablet Take 200 mg by mouth at bedtime.     Historical Provider, MD  trolamine salicylate (ASPERCREME) 10 % cream Apply 1 application topically as needed for muscle pain.    Historical Provider, MD   BP 120/51 mmHg  Pulse 69  Resp 22  SpO2 99% Physical Exam  Constitutional: She is oriented to person, place, and time. She appears well-developed and well-nourished. No distress.  HENT:  Head: Normocephalic and atraumatic.  Mouth/Throat: Oropharynx is clear and moist. No oropharyngeal exudate.  Eyes: EOM are normal. Pupils are equal, round, and reactive to light.  Fatigable rotary nystagmus  Neck: Normal range of motion. Neck supple.  No meningismus.  Cardiovascular: Normal rate and regular rhythm.  Exam reveals no gallop and no friction rub.   No murmur heard. Pulmonary/Chest: Effort normal and breath sounds normal. No respiratory distress. She has no wheezes. She has no rales.  Abdominal: Soft. Bowel sounds are normal. She exhibits no  distension and no mass. There is no tenderness. There is no rebound and no guarding.  Musculoskeletal: Normal range of motion. She exhibits no edema or tenderness.  Neurological: She is alert and oriented to person, place, and time.  5/5 motor in all extremities. Sensation is fully intact. Cranial nerves II through XII grossly intact. Right hand finger to nose intact. Questionable mild ataxia with the left hand.  Skin: Skin is warm and dry. No rash noted. No erythema.  Psychiatric: She has a normal mood and affect. Her behavior is normal.  Nursing note and vitals reviewed.   ED Course  Procedures (including critical care time) Labs Review Labs Reviewed  BASIC METABOLIC PANEL - Abnormal; Notable for the following:    Sodium 132 (*)    Potassium 2.8 (*)    Chloride 89 (*)    Glucose, Bld 117 (*)  All other components within normal limits  URINALYSIS, ROUTINE W REFLEX MICROSCOPIC (NOT AT [facility]) - Abnormal; Notable for the following:    APPearance CLOUDY (*)    Leukocytes, UA SMALL (*)    All other components within normal limits  CBG MONITORING, ED - Abnormal; Notable for the following:    Glucose-Capillary 116 (*)    All other components within normal limits  CBC  URINE MICROSCOPIC-ADD ON  Randolm Idol, ED    Imaging Review Mr Brain Wo Contrast  11/21/2014   CLINICAL DATA:  Intermittent dizziness, back pain and nausea for 3-4 months. Status post fall today dizziness. History of hypertension.  EXAM: MRI HEAD WITHOUT CONTRAST  TECHNIQUE: Multiplanar, multiecho pulse sequences of the brain and surrounding structures were obtained without intravenous contrast.  COMPARISON:  CT head November 10, 2014  FINDINGS: Mildly motion degraded examination.  The ventricles and sulci are normal for patient's age. No suspicious parenchymal signal, mass lesions, mass effect. Faint FLAIR T2 hyper in signal on the RIGHT occipital lobe, is likely root is secondary to flow related artifact from  transverse sinus. Scattered subcentimeter supratentorial white matter T2 hyperintensities compatible with chronic small vessel ischemic disease. No reduced diffusion to suggest acute ischemia. No susceptibility artifact to suggest hemorrhage.  No abnormal extra-axial fluid collections. No extra-axial masses though, contrast enhanced sequences would be more sensitive. Normal major intracranial vascular flow voids seen at the skull base.  Ocular globes and orbital contents are unremarkable though not tailored for evaluation. No abnormal sellar expansion. Visualized paranasal sinuses and mastoid air cells are well-aerated. No suspicious calvarial bone marrow signal. Craniocervical junction maintained. Patient is edentulous.  IMPRESSION: No acute intracranial process. Normal MRI of the brain without contrast for age.   Electronically Signed   By: Elon Alas M.D.   On: 11/21/2014 06:09   I have personally reviewed and evaluated these images and lab results as part of my medical decision-making.   EKG Interpretation None      MDM   Final diagnoses:  Vertigo  Hypokalemia    MRI without any abnormalities. Patient noted to be hypokalemic in the emergency department. Given IV replacement. She is now resting comfortably. Will need reassessment and ambulation but anticipate discharge home.  Patient signed out to oncoming emergency physician.  Julianne Rice, MD 11/21/14 [phone]

## 2014-11-21 NOTE — ED Provider Notes (Signed)
  Physical Exam  BP 143/56 mmHg  Pulse 74  Resp 14  SpO2 95%  Physical Exam  ED Course  Procedures  MDM Patient has had her potassium supplementation. There is not a clear reason for admission this time. Patient is not happy about going back to assisted living. I did not find criteria for admission. Will discharge.      Davonna Belling, MD 11/21/14 1041

## 2014-11-21 NOTE — ED Notes (Signed)
Patient transported to MRI 

## 2014-11-21 NOTE — ED Notes (Signed)
Attempted to get patient ready for PTAR. Pt tearful and crying, does not want to be DC'd and reports she is still hurting. MD notified.

## 2015-01-29 ENCOUNTER — Emergency Department (HOSPITAL_COMMUNITY)
Admission: EM | Admit: 2015-01-29 | Discharge: 2015-01-29 | Disposition: A | Discharge status: Home / Self Care | Payer: Medicare Other | Attending: Emergency Medicine | Admitting: Emergency Medicine

## 2015-01-29 ENCOUNTER — Emergency Department (HOSPITAL_COMMUNITY): Payer: Medicare Other

## 2015-01-29 ENCOUNTER — Encounter (HOSPITAL_COMMUNITY): Payer: Self-pay

## 2015-01-29 DIAGNOSIS — M549 Dorsalgia, unspecified: Secondary | ICD-10-CM | POA: Insufficient documentation

## 2015-01-29 DIAGNOSIS — Z8739 Personal history of other diseases of the musculoskeletal system and connective tissue: Secondary | ICD-10-CM | POA: Diagnosis not present

## 2015-01-29 DIAGNOSIS — I10 Essential (primary) hypertension: Secondary | ICD-10-CM | POA: Diagnosis not present

## 2015-01-29 DIAGNOSIS — F419 Anxiety disorder, unspecified: Secondary | ICD-10-CM | POA: Diagnosis not present

## 2015-01-29 DIAGNOSIS — R079 Chest pain, unspecified: Secondary | ICD-10-CM | POA: Insufficient documentation

## 2015-01-29 DIAGNOSIS — Z7951 Long term (current) use of inhaled steroids: Secondary | ICD-10-CM | POA: Diagnosis not present

## 2015-01-29 DIAGNOSIS — J069 Acute upper respiratory infection, unspecified: Secondary | ICD-10-CM | POA: Diagnosis not present

## 2015-01-29 DIAGNOSIS — G8929 Other chronic pain: Secondary | ICD-10-CM | POA: Insufficient documentation

## 2015-01-29 DIAGNOSIS — Z8781 Personal history of (healed) traumatic fracture: Secondary | ICD-10-CM | POA: Diagnosis not present

## 2015-01-29 DIAGNOSIS — K219 Gastro-esophageal reflux disease without esophagitis: Secondary | ICD-10-CM | POA: Diagnosis not present

## 2015-01-29 DIAGNOSIS — R05 Cough: Secondary | ICD-10-CM | POA: Diagnosis present

## 2015-01-29 DIAGNOSIS — B9789 Other viral agents as the cause of diseases classified elsewhere: Secondary | ICD-10-CM

## 2015-01-29 DIAGNOSIS — Z9181 History of falling: Secondary | ICD-10-CM | POA: Diagnosis not present

## 2015-01-29 DIAGNOSIS — E079 Disorder of thyroid, unspecified: Secondary | ICD-10-CM | POA: Diagnosis not present

## 2015-01-29 DIAGNOSIS — R630 Anorexia: Secondary | ICD-10-CM | POA: Insufficient documentation

## 2015-01-29 DIAGNOSIS — F329 Major depressive disorder, single episode, unspecified: Secondary | ICD-10-CM | POA: Diagnosis not present

## 2015-01-29 DIAGNOSIS — Z79899 Other long term (current) drug therapy: Secondary | ICD-10-CM | POA: Diagnosis not present

## 2015-01-29 DIAGNOSIS — R0981 Nasal congestion: Secondary | ICD-10-CM

## 2015-01-29 LAB — CBC WITH DIFFERENTIAL/PLATELET
BASOS ABS: 0 10*3/uL (ref 0.0–0.1)
BASOS PCT: 0 %
Eosinophils Absolute: 0 10*3/uL (ref 0.0–0.7)
Eosinophils Relative: 0 %
HEMATOCRIT: 41.3 % (ref 36.0–46.0)
HEMOGLOBIN: 14.6 g/dL (ref 12.0–15.0)
Lymphocytes Relative: 11 %
Lymphs Abs: 1 10*3/uL (ref 0.7–4.0)
MCH: 30.2 pg (ref 26.0–34.0)
MCHC: 35.4 g/dL (ref 30.0–36.0)
MCV: 85.5 fL (ref 78.0–100.0)
MONO ABS: 1 10*3/uL (ref 0.1–1.0)
Monocytes Relative: 11 %
NEUTROS ABS: 7.1 10*3/uL (ref 1.7–7.7)
NEUTROS PCT: 78 %
Platelets: 190 10*3/uL (ref 150–400)
RBC: 4.83 MIL/uL (ref 3.87–5.11)
RDW: 12.9 % (ref 11.5–15.5)
WBC: 9.1 10*3/uL (ref 4.0–10.5)

## 2015-01-29 LAB — URINALYSIS, ROUTINE W REFLEX MICROSCOPIC
Bilirubin Urine: NEGATIVE
Glucose, UA: NEGATIVE mg/dL
Hgb urine dipstick: NEGATIVE
Ketones, ur: NEGATIVE mg/dL
NITRITE: NEGATIVE
PH: 7.5 (ref 5.0–8.0)
Protein, ur: NEGATIVE mg/dL
SPECIFIC GRAVITY, URINE: 1.024 (ref 1.005–1.030)

## 2015-01-29 LAB — URINE MICROSCOPIC-ADD ON

## 2015-01-29 LAB — COMPREHENSIVE METABOLIC PANEL
ALBUMIN: 4.4 g/dL (ref 3.5–5.0)
ALT: 10 U/L — ABNORMAL LOW (ref 14–54)
AST: 17 U/L (ref 15–41)
Alkaline Phosphatase: 64 U/L (ref 38–126)
Anion gap: 10 (ref 5–15)
BILIRUBIN TOTAL: 1.3 mg/dL — AB (ref 0.3–1.2)
BUN: 9 mg/dL (ref 6–20)
CHLORIDE: 93 mmol/L — AB (ref 101–111)
CO2: 30 mmol/L (ref 22–32)
Calcium: 9.3 mg/dL (ref 8.9–10.3)
Creatinine, Ser: 0.82 mg/dL (ref 0.44–1.00)
GFR calc Af Amer: >60 mL/min (ref >60)
GFR calc non Af Amer: >60 mL/min (ref >60)
GLUCOSE: 126 mg/dL — AB (ref 65–99)
POTASSIUM: 3.2 mmol/L — AB (ref 3.5–5.1)
Sodium: 133 mmol/L — ABNORMAL LOW (ref 135–145)
TOTAL PROTEIN: 6.5 g/dL (ref 6.5–8.1)

## 2015-01-29 LAB — I-STAT TROPONIN, ED
Troponin i, poc: 0 ng/mL (ref 0.00–0.08)
Troponin i, poc: 0 ng/mL (ref 0.00–0.08)

## 2015-01-29 LAB — RAPID STREP SCREEN (MED CTR MEBANE ONLY): Streptococcus, Group A Screen (Direct): NEGATIVE

## 2015-01-29 LAB — INFLUENZA PANEL BY PCR (TYPE A & B)
H1N1 flu by pcr: NOT DETECTED
INFLAPCR: NEGATIVE
INFLBPCR: NEGATIVE

## 2015-01-29 MED ORDER — ALBUTEROL SULFATE HFA 108 (90 BASE) MCG/ACT IN AERS
2.0000 | INHALATION_SPRAY | Freq: Once | RESPIRATORY_TRACT | Status: AC
  Administered 2015-01-29: 2 via RESPIRATORY_TRACT
  Filled 2015-01-29: qty 6.7

## 2015-01-29 MED ORDER — MENTHOL 3 MG MT LOZG
1.0000 | LOZENGE | OROMUCOSAL | Status: DC | PRN
  Administered 2015-01-29: 3 mg via ORAL
  Filled 2015-01-29: qty 9

## 2015-01-29 MED ORDER — BENZONATATE 100 MG PO CAPS: 100.0000 mg | ORAL_CAPSULE | Freq: Three times a day (TID) | ORAL | Status: DC | PRN

## 2015-01-29 MED ORDER — BENZONATATE 100 MG PO CAPS
100.0000 mg | ORAL_CAPSULE | Freq: Once | ORAL | Status: AC
  Administered 2015-01-29: 100 mg via ORAL
  Filled 2015-01-29: qty 1

## 2015-01-29 MED ORDER — POTASSIUM CHLORIDE CRYS ER 20 MEQ PO TBCR
40.0000 meq | EXTENDED_RELEASE_TABLET | Freq: Once | ORAL | Status: AC
  Administered 2015-01-29: 40 meq via ORAL
  Filled 2015-01-29: qty 2

## 2015-01-29 MED ORDER — ALBUTEROL SULFATE HFA 108 (90 BASE) MCG/ACT IN AERS: 2.0000 | INHALATION_SPRAY | RESPIRATORY_TRACT | Status: DC | PRN

## 2015-01-29 MED ORDER — SODIUM CHLORIDE 0.9 % IV BOLUS (SEPSIS)
1000.0000 mL | Freq: Once | INTRAVENOUS | Status: AC
Start: 2015-01-29 — End: 2015-01-29
  Administered 2015-01-29: 1000 mL via INTRAVENOUS

## 2015-01-29 NOTE — ED Notes (Signed)
Bed: WA23 Expected date:  Expected time:  Means of arrival:  Comments: EMS 

## 2015-01-29 NOTE — ED Notes (Signed)
Pt complains of a cough since Wednesday

## 2015-01-29 NOTE — Discharge Instructions (Signed)
Upper Respiratory Infection, Adult Most upper respiratory infections (URIs) are a viral infection of the air passages leading to the lungs. A URI affects the nose, throat, and upper air passages. The most common type of URI is nasopharyngitis and is typically referred to as "the common cold." URIs run their course and usually go away on their own. Most of the time, a URI does not require medical attention, but sometimes a bacterial infection in the upper airways can follow a viral infection. This is called a secondary infection. Sinus and middle ear infections are common types of secondary upper respiratory infections. Bacterial pneumonia can also complicate a URI. A URI can worsen asthma and chronic obstructive pulmonary disease (COPD). Sometimes, these complications can require emergency medical care and may be life threatening.  CAUSES Almost all URIs are caused by viruses. A virus is a type of germ and can spread from one person to another.  RISKS FACTORS You may be at risk for a URI if:   You smoke.   You have chronic heart or lung disease.  You have a weakened defense (immune) system.   You are very young or very old.   You have nasal allergies or asthma.  You work in crowded or poorly ventilated areas.  You work in health care facilities or schools. SIGNS AND SYMPTOMS  Symptoms typically develop 2-3 days after you come in contact with a cold virus. Most viral URIs last 7-10 days. However, viral URIs from the influenza virus (flu virus) can last 14-18 days and are typically more severe. Symptoms may include:   Runny or stuffy (congested) nose.   Sneezing.   Cough.   Sore throat.   Headache.   Fatigue.   Fever.   Loss of appetite.   Pain in your forehead, behind your eyes, and over your cheekbones (sinus pain).  Muscle aches.  DIAGNOSIS  Your health care provider may diagnose a URI by:  Physical exam.  Tests to check that your symptoms are not due to  another condition such as:  Strep throat.  Sinusitis.  Pneumonia.  Asthma. TREATMENT  A URI goes away on its own with time. It cannot be cured with medicines, but medicines may be prescribed or recommended to relieve symptoms. Medicines may help:  Reduce your fever.  Reduce your cough.  Relieve nasal congestion. HOME CARE INSTRUCTIONS   Take medicines only as directed by your health care provider.   Gargle warm saltwater or take cough drops to comfort your throat as directed by your health care provider.  Use a warm mist humidifier or inhale steam from a shower to increase air moisture. This may make it easier to breathe.  Drink enough fluid to keep your urine clear or pale yellow.   Eat soups and other clear broths and maintain good nutrition.   Rest as needed.   Return to work when your temperature has returned to normal or as your health care provider advises. You may need to stay home longer to avoid infecting others. You can also use a face mask and careful hand washing to prevent spread of the virus.  Increase the usage of your inhaler if you have asthma.   Do not use any tobacco products, including cigarettes, chewing tobacco, or electronic cigarettes. If you need help quitting, ask your health care provider. PREVENTION  The best way to protect yourself from getting a cold is to practice good hygiene.   Avoid oral or hand contact with people with cold   symptoms.   Wash your hands often if contact occurs.  There is no clear evidence that vitamin C, vitamin E, echinacea, or exercise reduces the chance of developing a cold. However, it is always recommended to get plenty of rest, exercise, and practice good nutrition.  SEEK MEDICAL CARE IF:   You are getting worse rather than better.   Your symptoms are not controlled by medicine.   You have chills.  You have worsening shortness of breath.  You have brown or red mucus.  You have yellow or brown nasal  discharge.  You have pain in your face, especially when you bend forward.  You have a fever.  You have swollen neck glands.  You have pain while swallowing.  You have white areas in the back of your throat. SEEK IMMEDIATE MEDICAL CARE IF:   You have severe or persistent:  Headache.  Ear pain.  Sinus pain.  Chest pain.  You have chronic lung disease and any of the following:  Wheezing.  Prolonged cough.  Coughing up blood.  A change in your usual mucus.  You have a stiff neck.  You have changes in your:  Vision.  Hearing.  Thinking.  Mood. MAKE SURE YOU:   Understand these instructions.  Will watch your condition.  Will get help right away if you are not doing well or get worse.   This information is not intended to replace advice given to you by your health care provider. Make sure you discuss any questions you have with your health care provider.   Document Released: 08/02/2000 Document Revised: 06/23/2014 Document Reviewed: 05/14/2013 Elsevier Interactive Patient Education 2016 [employer]  

## 2015-01-29 NOTE — ED Provider Notes (Signed)
CSN: [id number]     Arrival date & time 01/29/15  0701 History   First MD Initiated Contact with Patient 01/29/15 [phone]     Chief Complaint  Patient presents with  . Cough     (Consider location/radiation/quality/duration/timing/severity/associated sxs/prior Treatment) HPI Comments: Cold since Wednesday Coughing, dry, severe Nasal congestion, worsening, clear, sinus pressure Sore throat Headache, severe, started slowly and got worse Chills last night, felt subj fever Decreased appetite Tylenol this AM, flonase, robaxin (for chronic back pain) CP burning middle of chest, worse with coughing No hx of lung prob, PE or smoking    Patient is a 80 y.o. female presenting with cough.  Cough Associated symptoms: chest pain, chills, fever (subj), myalgias and sore throat   Associated symptoms: no headaches, no rash and no shortness of breath     Past Medical History  Diagnosis Date  . Falls frequently   . Arthritis   . Hypertension   . Fracture, clavicle   . Thyroid disease   . History of fractured vertebra   . Wrist fracture, right   . Seasonal allergies   . Depression   . Anxiety   . GERD (gastroesophageal reflux disease)    Past Surgical History  Procedure Laterality Date  . Thyroidectomy    . Abdominal hysterectomy    . Back surgery    . Cholecystectomy    . Wrist surgery    . Fracture surgery     Family History  Problem Relation Age of Onset  . Hypertension Other    Social History  Substance Use Topics  . Smoking status: Never Smoker   . Smokeless tobacco: Never Used  . Alcohol Use: No   OB History    No data available     Review of Systems  Constitutional: Positive for fever (subj), chills, activity change, appetite change and fatigue.  HENT: Positive for congestion, sinus pressure and sore throat. Negative for trouble swallowing.   Eyes: Negative for visual disturbance.  Respiratory: Positive for cough. Negative for shortness of breath.    Cardiovascular: Positive for chest pain. Negative for leg swelling.  Gastrointestinal: Positive for abdominal pain (thi sAM, resolved). Negative for nausea, vomiting, diarrhea and constipation.  Genitourinary: Positive for flank pain (yesterday, resolved). Negative for dysuria, difficulty urinating and dyspareunia.  Musculoskeletal: Positive for myalgias and back pain (chronic). Negative for neck pain.  Skin: Negative for rash.  Neurological: Negative for syncope and headaches.      Allergies  Codeine; Cortisone; and Other  Home Medications   Prior to Admission medications   Medication Sig Start Date End Date Taking? Authorizing Provider  Alcaftadine (LASTACAFT) 0.25 % SOLN Place 1 drop into both eyes at bedtime.   Yes Historical Provider, MD  antiseptic oral rinse (BIOTENE) LIQD 15 mLs by Mouth Rinse route 3 (three) times daily.   Yes Historical Provider, MD  benzocaine-resorcinol (VAGISIL) 5-2 % vaginal cream Place 1 application vaginally as needed for itching.   Yes Historical Provider, MD  bismuth subsalicylate (PEPTO BISMOL) 262 MG/15ML suspension Take 30 mLs by mouth every 4 (four) hours as needed for diarrhea or loose stools.   Yes Historical Provider, MD  calcium-vitamin D (OSCAL WITH D) 500-200 MG-UNIT tablet Take 1 tablet by mouth daily.   Yes Historical Provider, MD  cetirizine (ZYRTEC) 10 MG tablet Take 10 mg by mouth every evening.   Yes Historical Provider, MD  chlorhexidine (PERIDEX) 0.12 % solution Swish and spit 15 mLs 2 (two) times daily.   Yes  Historical Provider, MD  FLUoxetine (PROZAC) 20 MG capsule Take 60 mg by mouth daily.   Yes Historical Provider, MD  fluticasone (FLONASE) 50 MCG/ACT nasal spray Place 2 sprays into both nostrils daily.   Yes Historical Provider, MD  hydroxypropyl methylcellulose / hypromellose (ISOPTO TEARS / GONIOVISC) 2.5 % ophthalmic solution Place 1 drop into both eyes 4 (four) times daily as needed for dry eyes.   Yes Historical Provider, MD   levothyroxine (SYNTHROID, LEVOTHROID) 112 MCG tablet Take 112 mcg by mouth daily before breakfast.   Yes Historical Provider, MD  loperamide (IMODIUM) 2 MG capsule Take 2 mg by mouth. After each loose stool   Yes Historical Provider, MD  meclizine (ANTIVERT) 25 MG tablet Take 1 tablet (25 mg total) by mouth every 8 (eight) hours. 11/12/14  Yes Bonnielee Haff, MD  methocarbamol (ROBAXIN) 750 MG tablet Take 1,500 mg by mouth every 8 (eight) hours.   Yes Historical Provider, MD  Multiple Vitamins-Minerals (PRESERVISION AREDS) CAPS Take 1 capsule by mouth 2 (two) times daily.   Yes Historical Provider, MD  Nutritional Supplements (NUTRITIONAL SHAKE PO) Take 1 Dose by mouth daily. Mighty shake   Yes Historical Provider, MD  ondansetron (ZOFRAN) 8 MG tablet Take 8 mg by mouth every 8 (eight) hours as needed for nausea or vomiting.   Yes Historical Provider, MD  pantoprazole (PROTONIX) 20 MG tablet Take 20 mg by mouth 2 (two) times daily.   Yes Historical Provider, MD  tobramycin (TOBREX) 0.3 % ophthalmic solution Place 1 drop into the right eye 4 (four) times daily as needed.   Yes Historical Provider, MD  traZODone (DESYREL) 100 MG tablet Take 200 mg by mouth at bedtime.    Yes Historical Provider, MD  triamcinolone cream (KENALOG) 0.1 % Apply 1 application topically every 6 (six) hours as needed (itching).   Yes Historical Provider, MD  triamterene-hydrochlorothiazide (MAXZIDE) 75-50 MG tablet Take 1 tablet by mouth daily.   Yes Historical Provider, MD  trolamine salicylate (ASPERCREME) 10 % cream Apply 1 application topically every 8 (eight) hours as needed for muscle pain.    Yes Historical Provider, MD  albuterol (PROVENTIL HFA;VENTOLIN HFA) 108 (90 BASE) MCG/ACT inhaler Inhale 2 puffs into the lungs every 4 (four) hours as needed for wheezing or shortness of breath (coough). 01/29/15   Gareth Morgan, MD  benzonatate (TESSALON) 100 MG capsule Take 1 capsule (100 mg total) by mouth 3 (three) times  daily as needed for cough. 01/29/15   Gareth Morgan, MD  docusate sodium (COLACE) 100 MG capsule Take 1 capsule (100 mg total) by mouth 2 (two) times daily. Patient not taking: Reported on 01/29/2015 11/12/14   Bonnielee Haff, MD  methocarbamol (ROBAXIN) 500 MG tablet Take 2 tablets (1,000 mg total) by mouth every 8 (eight) hours as needed for muscle spasms. Patient not taking: Reported on 11/10/2014 06/06/13   Kinnie Feil, MD  mupirocin nasal ointment (BACTROBAN NASAL) 2 % Place 1 application into the nose 2 (two) times daily. For five (5) days. Press sides of nose together and gently massage. Patient not taking: Reported on 11/21/2014 11/12/14   Bonnielee Haff, MD  polyethylene glycol (MIRALAX / GLYCOLAX) packet Take 17 g by mouth 2 (two) times daily. Patient not taking: Reported on 11/21/2014 11/12/14   Bonnielee Haff, MD   BP 134/61 mmHg  Pulse 71  Temp(Src) 98.9 F (37.2 C)  Resp 16  SpO2 98% Physical Exam  Constitutional: She is oriented to person, place, and time. She  appears well-developed and well-nourished. No distress.  HENT:  Head: Normocephalic and atraumatic.  Right Ear: External ear normal. Tympanic membrane is not injected.  Left Ear: External ear normal. Tympanic membrane is not injected.  Mouth/Throat: No oropharyngeal exudate.  Eyes: Conjunctivae and EOM are normal. Pupils are equal, round, and reactive to light.  Neck: Normal range of motion.  Cardiovascular: Normal rate, regular rhythm, normal heart sounds and intact distal pulses.  Exam reveals no gallop and no friction rub.   No murmur heard. Pulmonary/Chest: Effort normal and breath sounds normal. No respiratory distress. She has no wheezes. She has no rales.  Frequent coughing  Abdominal: Soft. She exhibits no distension. There is no tenderness. There is no guarding.  Musculoskeletal: She exhibits no edema or tenderness.  Neurological: She is alert and oriented to person, place, and time.  Skin: Skin is warm and  dry. No rash noted. She is not diaphoretic. No erythema.  Nursing note and vitals reviewed.   ED Course  Procedures (including critical care time) Labs Review Labs Reviewed  COMPREHENSIVE METABOLIC PANEL - Abnormal; Notable for the following:    Sodium 133 (*)    Potassium 3.2 (*)    Chloride 93 (*)    Glucose, Bld 126 (*)    ALT 10 (*)    Total Bilirubin 1.3 (*)    All other components within normal limits  URINALYSIS, ROUTINE W REFLEX MICROSCOPIC (NOT AT [facility] - Abnormal; Notable for the following:    APPearance CLOUDY (*)    Leukocytes, UA SMALL (*)    All other components within normal limits  URINE MICROSCOPIC-ADD ON - Abnormal; Notable for the following:    Squamous Epithelial / LPF 0-5 (*)    Bacteria, UA FEW (*)    Casts HYALINE CASTS (*)    All other components within normal limits  URINE CULTURE  RAPID STREP SCREEN (NOT AT [facility])  CULTURE, GROUP A STREP  CBC WITH DIFFERENTIAL/PLATELET  INFLUENZA PANEL BY PCR (TYPE A & B, H1N1)  I-STAT TROPOININ, ED  Randolm Idol, ED    Imaging Review Dg Chest 2 View  01/29/2015  CLINICAL DATA:  Patient complains of cough EXAM: CHEST  2 VIEW COMPARISON:  Thoracic spine 07/23/2013. FINDINGS: Normal mediastinum and cardiac silhouette. Low lung volumes. No effusion, infiltrate or pneumothorax. There is chronic compression deformities of the lower thoracic spine with focal kyphosis. Augmentation at T11 and T12 with vertebroplasty cement. IMPRESSION: 1. No acute cardiopulmonary findings. 2. Chronic compression fractures in the lower thoracic spine. Electronically Signed   By: Suzy Bouchard M.D.   On: 01/29/2015 07:53   I have personally reviewed and evaluated these images and lab results as part of my medical decision-making.   EKG Interpretation   Date/Time:  Friday January 29 2015 07:41:16 EST Ventricular Rate:  67 PR Interval:  177 QRS Duration: 92 QT Interval:  433 QTC Calculation: 457 R Axis:   6 Text Interpretation:   Sinus rhythm Low voltage, extremity and precordial  leads Probable anteroseptal infarct, old Borderline ST changes lateral  leads No significant change since last tracing Confirmed by [facility] MD,  [place] ([id number]) on 01/29/2015 7:50:09 AM      MDM   Final diagnoses:  Viral URI with cough  Nasal congestion   80yo female with history of htn, vertigo, fibromyalgia, GERD presents with concern for cough, nasal congestion, sore throat, chest pain.  Reports she believes influenza is going around her living facility.  Likely viral syndrome by history  and exam.  No sign of meningitis, SAH, epiglottitis, RPA, otitis.  Clear nasal discharge for 3 days unlikely bacterial sinus infxn.  CXR done shows  no sign of pneumonia or CHF. Strep screen negative. Influenza negative. Pt reports CP, and ECG unchanged from prior and troponin x2 negative. Low suspicion for aortic dissection, PE or other by history. CP likely secondary to cough/musculoskeletal.  Pt without wheezing on exam, however frequent dry cough concern for bronchial inflammation and given albuterol and tessalon pearls for cough.  In addition reports chronic back pain (no red flags) with some flank pain yesterday and urinalysis ordered showing no sign of UTI.  Discussed that pt has other viral syndrome. No tachypnea, no hypoxia, VS WNL and stable for outpatient symptomatic management. Given rx for tessalon pearls and albuterol. Patient discharged in stable condition with understanding of reasons to return.        Gareth Morgan, MD 01/30/15 1121

## 2015-01-30 LAB — URINE CULTURE

## 2015-01-31 LAB — CULTURE, GROUP A STREP: STREP A CULTURE: NEGATIVE

## 2015-02-09 ENCOUNTER — Encounter (HOSPITAL_COMMUNITY): Payer: Self-pay | Admitting: *Deleted

## 2015-02-09 ENCOUNTER — Emergency Department (HOSPITAL_COMMUNITY)
Admission: EM | Admit: 2015-02-09 | Discharge: 2015-02-09 | Disposition: A | Discharge status: Home / Self Care | Payer: Medicare Other | Attending: Emergency Medicine | Admitting: Emergency Medicine

## 2015-02-09 ENCOUNTER — Emergency Department (HOSPITAL_COMMUNITY): Payer: Medicare Other

## 2015-02-09 DIAGNOSIS — Z7951 Long term (current) use of inhaled steroids: Secondary | ICD-10-CM | POA: Diagnosis not present

## 2015-02-09 DIAGNOSIS — Z8781 Personal history of (healed) traumatic fracture: Secondary | ICD-10-CM | POA: Diagnosis not present

## 2015-02-09 DIAGNOSIS — S0181XA Laceration without foreign body of other part of head, initial encounter: Secondary | ICD-10-CM

## 2015-02-09 DIAGNOSIS — S59911A Unspecified injury of right forearm, initial encounter: Secondary | ICD-10-CM | POA: Insufficient documentation

## 2015-02-09 DIAGNOSIS — Z79899 Other long term (current) drug therapy: Secondary | ICD-10-CM | POA: Diagnosis not present

## 2015-02-09 DIAGNOSIS — W01190A Fall on same level from slipping, tripping and stumbling with subsequent striking against furniture, initial encounter: Secondary | ICD-10-CM | POA: Insufficient documentation

## 2015-02-09 DIAGNOSIS — I10 Essential (primary) hypertension: Secondary | ICD-10-CM | POA: Diagnosis not present

## 2015-02-09 DIAGNOSIS — Y998 Other external cause status: Secondary | ICD-10-CM | POA: Insufficient documentation

## 2015-02-09 DIAGNOSIS — E079 Disorder of thyroid, unspecified: Secondary | ICD-10-CM | POA: Insufficient documentation

## 2015-02-09 DIAGNOSIS — M1990 Unspecified osteoarthritis, unspecified site: Secondary | ICD-10-CM | POA: Diagnosis not present

## 2015-02-09 DIAGNOSIS — S7001XA Contusion of right hip, initial encounter: Secondary | ICD-10-CM | POA: Diagnosis not present

## 2015-02-09 DIAGNOSIS — K219 Gastro-esophageal reflux disease without esophagitis: Secondary | ICD-10-CM | POA: Diagnosis not present

## 2015-02-09 DIAGNOSIS — Y9389 Activity, other specified: Secondary | ICD-10-CM | POA: Diagnosis not present

## 2015-02-09 DIAGNOSIS — F419 Anxiety disorder, unspecified: Secondary | ICD-10-CM | POA: Insufficient documentation

## 2015-02-09 DIAGNOSIS — F329 Major depressive disorder, single episode, unspecified: Secondary | ICD-10-CM | POA: Diagnosis not present

## 2015-02-09 DIAGNOSIS — S01411A Laceration without foreign body of right cheek and temporomandibular area, initial encounter: Secondary | ICD-10-CM | POA: Diagnosis not present

## 2015-02-09 DIAGNOSIS — S0993XA Unspecified injury of face, initial encounter: Secondary | ICD-10-CM | POA: Diagnosis present

## 2015-02-09 DIAGNOSIS — W19XXXA Unspecified fall, initial encounter: Secondary | ICD-10-CM

## 2015-02-09 DIAGNOSIS — Y9289 Other specified places as the place of occurrence of the external cause: Secondary | ICD-10-CM | POA: Diagnosis not present

## 2015-02-09 MED ORDER — LIDOCAINE-EPINEPHRINE 1 %-1:100000 IJ SOLN
20.0000 mL | Freq: Once | INTRAMUSCULAR | Status: AC
  Administered 2015-02-09: 5 mL via INTRADERMAL
  Filled 2015-02-09: qty 1

## 2015-02-09 MED ORDER — ACETAMINOPHEN 500 MG PO TABS
1000.0000 mg | ORAL_TABLET | Freq: Once | ORAL | Status: AC
  Administered 2015-02-09: 1000 mg via ORAL
  Filled 2015-02-09: qty 2

## 2015-02-09 MED ORDER — OXYCODONE HCL 5 MG PO TABS
2.5000 mg | ORAL_TABLET | Freq: Once | ORAL | Status: AC
  Administered 2015-02-09: 2.5 mg via ORAL
  Filled 2015-02-09: qty 1

## 2015-02-09 NOTE — ED Notes (Signed)
Per EMS - patient comes from [place] at [facility] after falling from standing position.  This was a witnessed fall.  She tripped over a towel on floor and landed on right side, c/o right elbow pain, right hip pain and has a small laceration on right face.  Patient and staff deny LOC.  No shortening or rotation to RLE.  Bruising to right elbow noted.  Patient has small abrasion to right hip.  Vitals on scene, 154/76, HR 76, 97% on RA, RR 16.

## 2015-02-09 NOTE — Discharge Instructions (Signed)
You were  Facial Laceration  A facial laceration is a cut on the face. These injuries can be painful and cause bleeding. Lacerations usually heal quickly, but they need special care to reduce scarring. DIAGNOSIS  Your health care provider will take a medical history, ask for details about how the injury occurred, and examine the wound to determine how deep the cut is. TREATMENT  Some facial lacerations may not require closure. Others may not be able to be closed because of an increased risk of infection. The risk of infection and the chance for successful closure will depend on various factors, including the amount of time since the injury occurred. The wound may be cleaned to help prevent infection. If closure is appropriate, pain medicines may be given if needed. Your health care provider will use stitches (sutures), wound glue (adhesive), or skin adhesive strips to repair the laceration. These tools bring the skin edges together to allow for faster healing and a better cosmetic outcome. If needed, you may also be given a tetanus shot. HOME CARE INSTRUCTIONS  Only take over-the-counter or prescription medicines as directed by your health care provider.  Follow your health care provider's instructions for wound care. These instructions will vary depending on the technique used for closing the wound. For Sutures:  Keep the wound clean and dry.   If you were given a bandage (dressing), you should change it at least once a day. Also change the dressing if it becomes wet or dirty, or as directed by your health care provider.   Wash the wound with soap and water 2 times a day. Rinse the wound off with water to remove all soap. Pat the wound dry with a clean towel.   After cleaning, apply a thin layer of the antibiotic ointment recommended by your health care provider. This will help prevent infection and keep the dressing from sticking.   You may shower as usual after the first 24 hours. Do  not soak the wound in water until the sutures are removed.   Get your sutures removed as directed by your health care provider. With facial lacerations, sutures should usually be taken out after 4-5 days to avoid stitch marks.   Wait a few days after your sutures are removed before applying any makeup. For Skin Adhesive Strips:  Keep the wound clean and dry.   Do not get the skin adhesive strips wet. You may bathe carefully, using caution to keep the wound dry.   If the wound gets wet, pat it dry with a clean towel.   Skin adhesive strips will fall off on their own. You may trim the strips as the wound heals. Do not remove skin adhesive strips that are still stuck to the wound. They will fall off in time.  For Wound Adhesive:  You may briefly wet your wound in the shower or bath. Do not soak or scrub the wound. Do not swim. Avoid periods of heavy sweating until the skin adhesive has fallen off on its own. After showering or bathing, gently pat the wound dry with a clean towel.   Do not apply liquid medicine, cream medicine, ointment medicine, or makeup to your wound while the skin adhesive is in place. This may loosen the film before your wound is healed.   If a dressing is placed over the wound, be careful not to apply tape directly over the skin adhesive. This may cause the adhesive to be pulled off before the wound is healed.  Avoid prolonged exposure to sunlight or tanning lamps while the skin adhesive is in place.  The skin adhesive will usually remain in place for 5-10 days, then naturally fall off the skin. Do not pick at the adhesive film.  After Healing: Once the wound has healed, cover the wound with sunscreen during the day for 1 full year. This can help minimize scarring. Exposure to ultraviolet light in the first year will darken the scar. It can take 1-2 years for the scar to lose its redness and to heal completely.  SEEK MEDICAL CARE IF:  You have a fever. SEEK  IMMEDIATE MEDICAL CARE IF:  You have redness, pain, or swelling around the wound.   You see ayellowish-white fluid (pus) coming from the wound.    This information is not intended to replace advice given to you by your health care provider. Make sure you discuss any questions you have with your health care provider.   Document Released: 03/16/2004 Document Revised: 02/27/2014 Document Reviewed: 09/19/2012 Elsevier Interactive Patient Education [employer]. 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 It is recommended to have an outpatient ultrasound to further visualize this.

## 2015-02-09 NOTE — ED Notes (Signed)
Bed: [facility] Expected date:  Expected time:  Means of arrival:  Comments: EMS- 80yo F, fall/L arm and leg pain

## 2015-02-09 NOTE — ED Provider Notes (Signed)
CSN: [id number]     Arrival date & time 02/09/15  1020 History   First MD Initiated Contact with Patient 02/09/15 1031     Chief Complaint  Patient presents with  . Fall    right side pain     (Consider location/radiation/quality/duration/timing/severity/associated sxs/prior Treatment) Patient is a 80 y.o. female presenting with fall. The history is provided by the patient.  Fall This is a new problem. The current episode started less than 1 hour ago. The problem occurs constantly. The problem has not changed since onset.Pertinent negatives include no chest pain, no headaches and no shortness of breath. Nothing aggravates the symptoms. Nothing relieves the symptoms. She has tried nothing for the symptoms. The treatment provided no relief.    80 yo F with a cc of a fall.  Slipped while getting out of the bathtub. Patient stepped on a towel and slid. Landed on her right side. Complaining of pain to the right side of her face as well as the right arm. Patient states the pain in her right arm is from her wrist up into her shoulder. Worse with movement or palpation. Patient also complaining of some right lateral hip pain. No difficulty with ambulation after the fall. Denies chest pain shortness breath abdominal pain. Denies loss consciousness  Past Medical History  Diagnosis Date  . Falls frequently   . Arthritis   . Hypertension   . Fracture, clavicle   . Thyroid disease   . History of fractured vertebra   . Wrist fracture, right   . Seasonal allergies   . Depression   . Anxiety   . GERD (gastroesophageal reflux disease)    Past Surgical History  Procedure Laterality Date  . Thyroidectomy    . Abdominal hysterectomy    . Back surgery    . Cholecystectomy    . Wrist surgery    . Fracture surgery     Family History  Problem Relation Age of Onset  . Hypertension Other    Social History  Substance Use Topics  . Smoking status: Never Smoker   . Smokeless tobacco: Never Used   . Alcohol Use: No   OB History    No data available     Review of Systems  Constitutional: Negative for fever and chills.  HENT: Negative for congestion and rhinorrhea.   Eyes: Negative for redness and visual disturbance.  Respiratory: Negative for shortness of breath and wheezing.   Cardiovascular: Negative for chest pain and palpitations.  Gastrointestinal: Negative for nausea and vomiting.  Genitourinary: Negative for dysuria and urgency.  Musculoskeletal: Positive for myalgias and arthralgias.  Skin: Positive for wound. Negative for pallor.  Neurological: Negative for dizziness and headaches.      Allergies  Codeine; Cortisone; and Other  Home Medications   Prior to Admission medications   Medication Sig Start Date End Date Taking? Authorizing Provider  albuterol (PROVENTIL HFA;VENTOLIN HFA) 108 (90 BASE) MCG/ACT inhaler Inhale 2 puffs into the lungs every 4 (four) hours as needed for wheezing or shortness of breath (coough). 01/29/15  Yes Gareth Morgan, MD  Alcaftadine (LASTACAFT) 0.25 % SOLN Place 1 drop into both eyes at bedtime.   Yes Historical Provider, MD  antiseptic oral rinse (BIOTENE) LIQD 15 mLs by Mouth Rinse route 3 (three) times daily.   Yes Historical Provider, MD  benzocaine-resorcinol (VAGISIL) 5-2 % vaginal cream Place 1 application vaginally as needed for itching.   Yes Historical Provider, MD  benzonatate (TESSALON) 100 MG capsule Take 1  capsule (100 mg total) by mouth 3 (three) times daily as needed for cough. 01/29/15  Yes Gareth Morgan, MD  bismuth subsalicylate (PEPTO BISMOL) 262 MG/15ML suspension Take 30 mLs by mouth every 4 (four) hours as needed for diarrhea or loose stools.   Yes Historical Provider, MD  calcium-vitamin D (OSCAL WITH D) 500-200 MG-UNIT tablet Take 1 tablet by mouth daily.   Yes Historical Provider, MD  cetirizine (ZYRTEC) 10 MG tablet Take 10 mg by mouth every evening.   Yes Historical Provider, MD  chlorhexidine (PERIDEX) 0.12  % solution Swish and spit 15 mLs 2 (two) times daily.   Yes Historical Provider, MD  FLUoxetine (PROZAC) 20 MG capsule Take 60 mg by mouth daily.   Yes Historical Provider, MD  fluticasone (FLONASE) 50 MCG/ACT nasal spray Place 2 sprays into both nostrils daily.   Yes Historical Provider, MD  Infant Care Products (JOHNSONS BABY SHAMPOO EX) Apply topically. Dilute a pea size amount with water; close right eye, wash and rinse. Repeat twice daily for 2 days prior to injection.   Yes Historical Provider, MD  levothyroxine (SYNTHROID, LEVOTHROID) 112 MCG tablet Take 112 mcg by mouth daily before breakfast.   Yes Historical Provider, MD  loperamide (IMODIUM) 2 MG capsule Take 2 mg by mouth. After each loose stool   Yes Historical Provider, MD  meclizine (ANTIVERT) 25 MG tablet Take 1 tablet (25 mg total) by mouth every 8 (eight) hours. 11/12/14  Yes Bonnielee Haff, MD  methocarbamol (ROBAXIN) 750 MG tablet Take 1,500 mg by mouth every 8 (eight) hours.   Yes Historical Provider, MD  Multiple Vitamins-Minerals (PRESERVISION AREDS) CAPS Take 1 capsule by mouth 2 (two) times daily.   Yes Historical Provider, MD  ondansetron (ZOFRAN) 8 MG tablet Take 8 mg by mouth every 8 (eight) hours as needed for nausea or vomiting.   Yes Historical Provider, MD  pantoprazole (PROTONIX) 20 MG tablet Take 20 mg by mouth 2 (two) times daily.   Yes Historical Provider, MD  polyvinyl alcohol (LIQUIFILM TEARS) 1.4 % ophthalmic solution Place 1 drop into both eyes. Four to six times daily as needed for try eyes.   Yes Historical Provider, MD  tobramycin (TOBREX) 0.3 % ophthalmic solution Place 1 drop into the right eye 4 (four) times daily as needed (the day prior, the day of, and the day after treatment.).    Yes Historical Provider, MD  traZODone (DESYREL) 100 MG tablet Take 200 mg by mouth at bedtime.    Yes Historical Provider, MD  triamcinolone cream (KENALOG) 0.1 % Apply 1 application topically every 6 (six) hours as needed  (itching).   Yes Historical Provider, MD  triamterene-hydrochlorothiazide (MAXZIDE) 75-50 MG tablet Take 1 tablet by mouth daily.   Yes Historical Provider, MD  trolamine salicylate (ASPERCREME) 10 % cream Apply 1 application topically every 8 (eight) hours as needed for muscle pain.    Yes Historical Provider, MD  docusate sodium (COLACE) 100 MG capsule Take 1 capsule (100 mg total) by mouth 2 (two) times daily. Patient not taking: Reported on 01/29/2015 11/12/14   Bonnielee Haff, MD  methocarbamol (ROBAXIN) 500 MG tablet Take 2 tablets (1,000 mg total) by mouth every 8 (eight) hours as needed for muscle spasms. Patient not taking: Reported on 11/10/2014 06/06/13   Kinnie Feil, MD  mupirocin nasal ointment (BACTROBAN NASAL) 2 % Place 1 application into the nose 2 (two) times daily. For five (5) days. Press sides of nose together and gently massage. Patient  not taking: Reported on 11/21/2014 11/12/14   Bonnielee Haff, MD  polyethylene glycol (MIRALAX / GLYCOLAX) packet Take 17 g by mouth 2 (two) times daily. Patient not taking: Reported on 11/21/2014 11/12/14   Bonnielee Haff, MD   BP 119/69 mmHg  Pulse 65  Temp(Src) 97.6 F (36.4 C) (Oral)  Resp 18  SpO2 94% Physical Exam  Constitutional: She is oriented to person, place, and time. She appears well-developed and well-nourished. No distress.  HENT:  Head: Normocephalic and atraumatic.  Lac to R zygomatic arch, 3cm  Eyes: EOM are normal. Pupils are equal, round, and reactive to light.  Neck: Normal range of motion. Neck supple.  Cardiovascular: Normal rate and regular rhythm.  Exam reveals no gallop and no friction rub.   No murmur heard. Pulmonary/Chest: Effort normal. She has no wheezes. She has no rales.  Abdominal: Soft. She exhibits no distension. There is no tenderness. There is no rebound and no guarding.  Musculoskeletal: She exhibits no edema or tenderness.  Mild bruising to the right lateral aspect of the hip. Patient with pain  with supination of the right upper extremity. Pain radiates down the arm. Negative Spurling's. No noted neck tenderness on my exam. No trapezius tenderness. Pulse motor and sensation intact distally.  Neurological: She is alert and oriented to person, place, and time.  Skin: Skin is warm and dry. She is not diaphoretic.  Psychiatric: She has a normal mood and affect. Her behavior is normal.  Nursing note and vitals reviewed.   ED Course  .Marland KitchenLaceration Repair Date/Time: 02/09/2015 3:23 PM Performed by: Tyrone Nine Rajon Bisig Authorized by: Deno Etienne Consent: Verbal consent obtained. Risks and benefits: risks, benefits and alternatives were discussed Consent given by: patient Required items: required blood products, implants, devices, and special equipment available Body area: head/neck Location details: right cheek Laceration length: 2 cm Tendon involvement: none Nerve involvement: none Vascular damage: no Anesthesia: local infiltration Local anesthetic: lidocaine 1% with epinephrine Anesthetic total: 5 ml Patient sedated: no Irrigation solution: saline Irrigation method: syringe Amount of cleaning: standard Debridement: none Degree of undermining: none Skin closure: 4-0 nylon Number of sutures: 2 Technique: complex Approximation: close Approximation difficulty: simple Patient tolerance: Patient tolerated the procedure well with no immediate complications   (including critical care time) Labs Review Labs Reviewed - No data to display  Imaging Review Dg Shoulder Right  02/09/2015  CLINICAL DATA:  Find status post trip and fall today in the bathroom with a right shoulder injury and pain. Initial encounter. EXAM: RIGHT SHOULDER - 2+ VIEW COMPARISON:  None. FINDINGS: No acute bony or joint abnormality is identified. Small calcifications projecting over the humeral head are likely due to calcific rotator cuff tendinopathy. The humeral head is somewhat high-riding. Mild acromioclavicular  degenerative change is seen. Image right lung and ribs are unremarkable. IMPRESSION: No acute abnormality. High-riding humeral head compatible with chronic rotator cuff tear. Findings suggestive of calcific rotator cuff tendinopathy also identified. Electronically Signed   By: Inge Rise M.D.   On: 02/09/2015 12:13   Dg Wrist Complete Right  02/09/2015  CLINICAL DATA:  Right wrist pain, tripped over a towel in the shower today EXAM: RIGHT WRIST - COMPLETE 3+ VIEW COMPARISON:  02/16/2013 FINDINGS: Four views of the right wrist submitted. No acute fracture or subluxation. There are degenerative changes with narrowing of radiocarpal joint space. Metallic fixation plate distal radius is unchanged in position. Degenerative changes first carpometacarpal joint. IMPRESSION: No acute fracture or subluxation. Stable postsurgical changes distal radius.  Degenerative changes as described above. Electronically Signed   By: Lahoma Crocker M.D.   On: 02/09/2015 12:14   Ct Head Wo Contrast  02/09/2015  CLINICAL DATA:  Patient status post fall from standing. Right facial laceration. No reported loss of consciousness. EXAM: CT HEAD WITHOUT CONTRAST CT CERVICAL SPINE WITHOUT CONTRAST TECHNIQUE: Multidetector CT imaging of the head and cervical spine was performed following the standard protocol without intravenous contrast. Multiplanar CT image reconstructions of the cervical spine were also generated. COMPARISON:  MRI brain 11/21/2014.  CT head 11/10/2014. FINDINGS: CT HEAD FINDINGS Ventricles and sulci are appropriate for patient's age. No evidence for acute cortically based infarct, intracranial hemorrhage, mass lesion or mass-effect. Orbits are unremarkable. Small amount of fluid within the right maxillary sinus. Remainder of the paranasal sinuses are unremarkable. Mastoid air cells are well aerated. The calvarium is intact. CT CERVICAL SPINE FINDINGS Straightening of the normal cervical lordosis. Degenerative disc  disease most pronounced C5-6. Multilevel facet degenerative changes bilaterally. Preservation the vertebral body heights. Craniocervical junction is intact. No evidence for acute cervical spine fracture. There is a 2.0 x 1.4 cm soft tissue mass within the right peritracheal location (image 60; series 5). IMPRESSION: No acute intracranial process. No acute cervical spine fracture. 2 cm right peritracheal mass near the expected location of the thyroid. Recommend correlation with neck ultrasound. Electronically Signed   By: Lovey Newcomer M.D.   On: 02/09/2015 12:49   Ct Cervical Spine Wo Contrast  02/09/2015  CLINICAL DATA:  Patient status post fall from standing. Right facial laceration. No reported loss of consciousness. EXAM: CT HEAD WITHOUT CONTRAST CT CERVICAL SPINE WITHOUT CONTRAST TECHNIQUE: Multidetector CT imaging of the head and cervical spine was performed following the standard protocol without intravenous contrast. Multiplanar CT image reconstructions of the cervical spine were also generated. COMPARISON:  MRI brain 11/21/2014.  CT head 11/10/2014. FINDINGS: CT HEAD FINDINGS Ventricles and sulci are appropriate for patient's age. No evidence for acute cortically based infarct, intracranial hemorrhage, mass lesion or mass-effect. Orbits are unremarkable. Small amount of fluid within the right maxillary sinus. Remainder of the paranasal sinuses are unremarkable. Mastoid air cells are well aerated. The calvarium is intact. CT CERVICAL SPINE FINDINGS Straightening of the normal cervical lordosis. Degenerative disc disease most pronounced C5-6. Multilevel facet degenerative changes bilaterally. Preservation the vertebral body heights. Craniocervical junction is intact. No evidence for acute cervical spine fracture. There is a 2.0 x 1.4 cm soft tissue mass within the right peritracheal location (image 60; series 5). IMPRESSION: No acute intracranial process. No acute cervical spine fracture. 2 cm right  peritracheal mass near the expected location of the thyroid. Recommend correlation with neck ultrasound. Electronically Signed   By: Lovey Newcomer M.D.   On: 02/09/2015 12:49   Dg Hip Unilat With Pelvis 2-3 Views Right  02/09/2015  CLINICAL DATA:  Post fall on tall getting out of shower today, now with right hip pain. EXAM: DG HIP (WITH OR WITHOUT PELVIS) 2-3V RIGHT COMPARISON:  None. FINDINGS: Osteopenia without displaced fracture.  No dislocation. Mild degenerative change of the right hip with joint space loss, subchondral sclerosis and osteophytosis. No evidence avascular necrosis. Limited visualization of the adjacent pelvis is normal. Suspected L5 laminectomy, incompletely evaluated. At least moderate degenerative change of the contralateral hip is suspected though incompletely evaluated. Several phleboliths overlie the right hemipelvis. Regional soft tissues appear otherwise normal. IMPRESSION: No fracture or dislocation. Electronically Signed   By: Sandi Mariscal M.D.   On: 02/09/2015  12:12   I have personally reviewed and evaluated these images and lab results as part of my medical decision-making.   EKG Interpretation None      MDM   Final diagnoses:  Fall, initial encounter  Facial laceration, initial encounter    80 yo F with a chief complaint of a fall. Patient complaining of some vague pain to her right arm unable to localize with palpation of the bony prominences. Pain is worse with movement suspect more likely to be a muscle strain. Patient with mild bruising to the right lateral aspect of her hip. No pain with internal/external rotation. Will obtain plain films CT the head CT C-spine. Repair the facial laceration at bedside.  CT negative.  D/c home.   3:23 PM:  I have discussed the diagnosis/risks/treatment options with the patient and family and believe the pt to be eligible for discharge home to follow-up with PCP. We also discussed returning to the ED immediately if new or  worsening sx occur. We discussed the sx which are most concerning (e.g., sudden worsening pain, fever, inability to tolerate by mouth) that necessitate immediate return. Medications administered to the patient during their visit and any new prescriptions provided to the patient are listed below.  Medications given during this visit Medications  acetaminophen (TYLENOL) tablet 1,000 mg (1,000 mg Oral Given 02/09/15 1124)  oxyCODONE (Oxy IR/ROXICODONE) immediate release tablet 2.5 mg (2.5 mg Oral Given 02/09/15 1124)  lidocaine-EPINEPHrine (XYLOCAINE W/EPI) 1 %-1:100000 (with pres) injection 20 mL (5 mLs Intradermal Given 02/09/15 1124)    Discharge Medication List as of 02/09/2015 12:55 PM      The patient appears reasonably screen and/or stabilized for discharge and I doubt any other medical condition or other [facility] requiring further screening, evaluation, or treatment in the ED at this time prior to discharge.    Deno Etienne, DO 02/09/15 1524

## 2015-02-09 NOTE — ED Notes (Signed)
Bed: WA09 Expected date:  Expected time:  Means of arrival:  Comments: EMS- 80yo F, n/v/d

## 2015-02-10 ENCOUNTER — Encounter (HOSPITAL_COMMUNITY): Payer: Self-pay | Admitting: *Deleted

## 2015-02-10 ENCOUNTER — Emergency Department (HOSPITAL_COMMUNITY): Payer: Medicare Other

## 2015-02-10 ENCOUNTER — Observation Stay (HOSPITAL_COMMUNITY)
Admission: EM | Admit: 2015-02-10 | Discharge: 2015-02-12 | Disposition: A | Discharge status: Skilled Nursing Facility | Payer: Medicare Other | Attending: Family Medicine | Admitting: Family Medicine

## 2015-02-10 DIAGNOSIS — R42 Dizziness and giddiness: Secondary | ICD-10-CM | POA: Diagnosis not present

## 2015-02-10 DIAGNOSIS — S52121A Displaced fracture of head of right radius, initial encounter for closed fracture: Secondary | ICD-10-CM | POA: Diagnosis not present

## 2015-02-10 DIAGNOSIS — I951 Orthostatic hypotension: Principal | ICD-10-CM | POA: Insufficient documentation

## 2015-02-10 DIAGNOSIS — Y92002 Bathroom of unspecified non-institutional (private) residence single-family (private) house as the place of occurrence of the external cause: Secondary | ICD-10-CM | POA: Diagnosis not present

## 2015-02-10 DIAGNOSIS — Z7951 Long term (current) use of inhaled steroids: Secondary | ICD-10-CM | POA: Insufficient documentation

## 2015-02-10 DIAGNOSIS — K5900 Constipation, unspecified: Secondary | ICD-10-CM | POA: Insufficient documentation

## 2015-02-10 DIAGNOSIS — M1990 Unspecified osteoarthritis, unspecified site: Secondary | ICD-10-CM | POA: Insufficient documentation

## 2015-02-10 DIAGNOSIS — R197 Diarrhea, unspecified: Secondary | ICD-10-CM | POA: Insufficient documentation

## 2015-02-10 DIAGNOSIS — R55 Syncope and collapse: Secondary | ICD-10-CM | POA: Diagnosis present

## 2015-02-10 DIAGNOSIS — Z9181 History of falling: Secondary | ICD-10-CM | POA: Insufficient documentation

## 2015-02-10 DIAGNOSIS — Z79899 Other long term (current) drug therapy: Secondary | ICD-10-CM | POA: Diagnosis not present

## 2015-02-10 DIAGNOSIS — W1830XA Fall on same level, unspecified, initial encounter: Secondary | ICD-10-CM | POA: Diagnosis not present

## 2015-02-10 DIAGNOSIS — M5416 Radiculopathy, lumbar region: Secondary | ICD-10-CM | POA: Diagnosis not present

## 2015-02-10 DIAGNOSIS — F329 Major depressive disorder, single episode, unspecified: Secondary | ICD-10-CM | POA: Diagnosis not present

## 2015-02-10 DIAGNOSIS — E039 Hypothyroidism, unspecified: Secondary | ICD-10-CM | POA: Insufficient documentation

## 2015-02-10 DIAGNOSIS — H3530 Unspecified macular degeneration: Secondary | ICD-10-CM | POA: Insufficient documentation

## 2015-02-10 DIAGNOSIS — R51 Headache: Secondary | ICD-10-CM | POA: Diagnosis not present

## 2015-02-10 DIAGNOSIS — R05 Cough: Secondary | ICD-10-CM | POA: Diagnosis not present

## 2015-02-10 DIAGNOSIS — G9001 Carotid sinus syncope: Secondary | ICD-10-CM | POA: Diagnosis not present

## 2015-02-10 DIAGNOSIS — I10 Essential (primary) hypertension: Secondary | ICD-10-CM | POA: Diagnosis not present

## 2015-02-10 DIAGNOSIS — R296 Repeated falls: Secondary | ICD-10-CM | POA: Insufficient documentation

## 2015-02-10 DIAGNOSIS — K219 Gastro-esophageal reflux disease without esophagitis: Secondary | ICD-10-CM | POA: Diagnosis not present

## 2015-02-10 HISTORY — DX: Dizziness and giddiness: R42

## 2015-02-10 LAB — BASIC METABOLIC PANEL
ANION GAP: 10 (ref 5–15)
BUN: 10 mg/dL (ref 6–20)
CO2: 33 mmol/L — AB (ref 22–32)
Calcium: 9.2 mg/dL (ref 8.9–10.3)
Chloride: 91 mmol/L — ABNORMAL LOW (ref 101–111)
Creatinine, Ser: 0.84 mg/dL (ref 0.44–1.00)
GFR calc Af Amer: >60 mL/min (ref >60)
GLUCOSE: 137 mg/dL — AB (ref 65–99)
POTASSIUM: 3.4 mmol/L — AB (ref 3.5–5.1)
Sodium: 134 mmol/L — ABNORMAL LOW (ref 135–145)

## 2015-02-10 LAB — CBC WITH DIFFERENTIAL/PLATELET
Basophils Absolute: 0 10*3/uL (ref 0.0–0.1)
Basophils Relative: 0 %
EOS PCT: 0 %
Eosinophils Absolute: 0 10*3/uL (ref 0.0–0.7)
HEMATOCRIT: 41.7 % (ref 36.0–46.0)
Hemoglobin: 14.9 g/dL (ref 12.0–15.0)
LYMPHS ABS: 0.8 10*3/uL (ref 0.7–4.0)
LYMPHS PCT: 9 %
MCH: 30.7 pg (ref 26.0–34.0)
MCHC: 35.7 g/dL (ref 30.0–36.0)
MCV: 85.8 fL (ref 78.0–100.0)
MONO ABS: 0.8 10*3/uL (ref 0.1–1.0)
Monocytes Relative: 10 %
NEUTROS ABS: 6.5 10*3/uL (ref 1.7–7.7)
Neutrophils Relative %: 81 %
PLATELETS: 218 10*3/uL (ref 150–400)
RBC: 4.86 MIL/uL (ref 3.87–5.11)
RDW: 12.8 % (ref 11.5–15.5)
WBC: 8 10*3/uL (ref 4.0–10.5)

## 2015-02-10 LAB — TYPE AND SCREEN
ABO/RH(D): A NEG
ANTIBODY SCREEN: NEGATIVE

## 2015-02-10 LAB — URINE MICROSCOPIC-ADD ON

## 2015-02-10 LAB — URINALYSIS, ROUTINE W REFLEX MICROSCOPIC
Bilirubin Urine: NEGATIVE
GLUCOSE, UA: NEGATIVE mg/dL
Hgb urine dipstick: NEGATIVE
KETONES UR: NEGATIVE mg/dL
NITRITE: NEGATIVE
PROTEIN: NEGATIVE mg/dL
Specific Gravity, Urine: 1.027 (ref 1.005–1.030)
pH: 7.5 (ref 5.0–8.0)

## 2015-02-10 LAB — I-STAT TROPONIN, ED: TROPONIN I, POC: 0 ng/mL (ref 0.00–0.08)

## 2015-02-10 LAB — ABO/RH: ABO/RH(D): A NEG

## 2015-02-10 LAB — MRSA PCR SCREENING: MRSA BY PCR: POSITIVE — AB

## 2015-02-10 LAB — PROTIME-INR
INR: 1 (ref 0.00–1.49)
Prothrombin Time: 13.4 seconds (ref 11.6–15.2)

## 2015-02-10 LAB — CBG MONITORING, ED: GLUCOSE-CAPILLARY: 127 mg/dL — AB (ref 65–99)

## 2015-02-10 MED ORDER — HYDROCODONE-ACETAMINOPHEN 5-325 MG PO TABS
1.0000 | ORAL_TABLET | ORAL | Status: DC | PRN
  Administered 2015-02-10: 1 via ORAL
  Filled 2015-02-10: qty 1

## 2015-02-10 MED ORDER — FLUOXETINE HCL 20 MG PO CAPS
60.0000 mg | ORAL_CAPSULE | Freq: Every day | ORAL | Status: DC
Start: 2015-02-10 — End: 2015-02-12
  Administered 2015-02-10 – 2015-02-12 (×3): 60 mg via ORAL
  Filled 2015-02-10 (×3): qty 3

## 2015-02-10 MED ORDER — DEXTROSE-NACL 5-0.45 % IV SOLN
INTRAVENOUS | Status: AC
  Administered 2015-02-10: 13:00:00 via INTRAVENOUS

## 2015-02-10 MED ORDER — KETOTIFEN FUMARATE 0.025 % OP SOLN
1.0000 [drp] | Freq: Every day | OPHTHALMIC | Status: DC
  Administered 2015-02-10 – 2015-02-11 (×2): 1 [drp] via OPHTHALMIC
  Filled 2015-02-10: qty 5

## 2015-02-10 MED ORDER — CALCIUM CARBONATE-VITAMIN D 500-200 MG-UNIT PO TABS
1.0000 | ORAL_TABLET | Freq: Every day | ORAL | Status: DC
  Administered 2015-02-10 – 2015-02-12 (×3): 1 via ORAL
  Filled 2015-02-10 (×3): qty 1

## 2015-02-10 MED ORDER — POLYETHYLENE GLYCOL 3350 17 G PO PACK
17.0000 g | PACK | Freq: Two times a day (BID) | ORAL | Status: DC
  Administered 2015-02-11: 17 g via ORAL
  Filled 2015-02-10 (×4): qty 1

## 2015-02-10 MED ORDER — METHOCARBAMOL 500 MG PO TABS
1500.0000 mg | ORAL_TABLET | Freq: Three times a day (TID) | ORAL | Status: DC
  Administered 2015-02-10 – 2015-02-12 (×7): 1500 mg via ORAL
  Filled 2015-02-10 (×7): qty 3

## 2015-02-10 MED ORDER — TOBRAMYCIN 0.3 % OP SOLN
1.0000 [drp] | Freq: Four times a day (QID) | OPHTHALMIC | Status: DC | PRN
  Filled 2015-02-10: qty 5

## 2015-02-10 MED ORDER — CHLORHEXIDINE GLUCONATE 0.12 % MT SOLN
15.0000 mL | Freq: Two times a day (BID) | OROMUCOSAL | Status: DC
  Administered 2015-02-10 – 2015-02-12 (×5): 15 mL via OROMUCOSAL
  Filled 2015-02-10 (×5): qty 15

## 2015-02-10 MED ORDER — CETYLPYRIDINIUM CHLORIDE 0.05 % MT LIQD
7.0000 mL | Freq: Two times a day (BID) | OROMUCOSAL | Status: DC
  Administered 2015-02-10 – 2015-02-12 (×5): 7 mL via OROMUCOSAL

## 2015-02-10 MED ORDER — TROLAMINE SALICYLATE 10 % EX CREA
1.0000 | TOPICAL_CREAM | Freq: Three times a day (TID) | CUTANEOUS | Status: DC | PRN
Start: 2015-02-10 — End: 2015-02-12
  Filled 2015-02-10: qty 85

## 2015-02-10 MED ORDER — SODIUM CHLORIDE 0.9 % IV SOLN
1000.0000 mL | INTRAVENOUS | Status: DC
  Administered 2015-02-10 – 2015-02-11 (×2): 1000 mL via INTRAVENOUS

## 2015-02-10 MED ORDER — TRAZODONE HCL 50 MG PO TABS
200.0000 mg | ORAL_TABLET | Freq: Every day | ORAL | Status: DC
  Administered 2015-02-10 – 2015-02-11 (×2): 200 mg via ORAL
  Filled 2015-02-10 (×2): qty 4

## 2015-02-10 MED ORDER — POLYVINYL ALCOHOL 1.4 % OP SOLN
1.0000 [drp] | OPHTHALMIC | Status: DC | PRN
  Filled 2015-02-10: qty 15

## 2015-02-10 MED ORDER — LORATADINE 10 MG PO TABS
10.0000 mg | ORAL_TABLET | Freq: Every day | ORAL | Status: DC
  Administered 2015-02-10 – 2015-02-12 (×3): 10 mg via ORAL
  Filled 2015-02-10 (×3): qty 1

## 2015-02-10 MED ORDER — FENTANYL CITRATE (PF) 100 MCG/2ML IJ SOLN
25.0000 ug | Freq: Once | INTRAMUSCULAR | Status: AC
  Administered 2015-02-10: 25 ug via INTRAVENOUS
  Filled 2015-02-10: qty 2

## 2015-02-10 MED ORDER — ENOXAPARIN SODIUM 40 MG/0.4ML ~~LOC~~ SOLN
40.0000 mg | SUBCUTANEOUS | Status: DC
  Administered 2015-02-10 – 2015-02-11 (×2): 40 mg via SUBCUTANEOUS
  Filled 2015-02-10 (×2): qty 0.4

## 2015-02-10 MED ORDER — PANTOPRAZOLE SODIUM 20 MG PO TBEC
20.0000 mg | DELAYED_RELEASE_TABLET | Freq: Two times a day (BID) | ORAL | Status: DC
  Administered 2015-02-10 – 2015-02-12 (×5): 20 mg via ORAL
  Filled 2015-02-10 (×6): qty 1

## 2015-02-10 MED ORDER — CALCIUM CARBONATE ANTACID 500 MG PO CHEW
400.0000 mg | CHEWABLE_TABLET | Freq: Two times a day (BID) | ORAL | Status: DC
Start: 2015-02-10 — End: 2015-02-12
  Administered 2015-02-10 – 2015-02-12 (×5): 400 mg via ORAL
  Filled 2015-02-10 (×5): qty 2

## 2015-02-10 MED ORDER — FLUTICASONE PROPIONATE 50 MCG/ACT NA SUSP
2.0000 | Freq: Every day | NASAL | Status: DC
  Administered 2015-02-11 – 2015-02-12 (×2): 2 via NASAL
  Filled 2015-02-10: qty 16

## 2015-02-10 MED ORDER — OXYCODONE-ACETAMINOPHEN 5-325 MG PO TABS
2.0000 | ORAL_TABLET | ORAL | Status: DC | PRN
  Administered 2015-02-10 – 2015-02-12 (×8): 2 via ORAL
  Filled 2015-02-10 (×8): qty 2

## 2015-02-10 MED ORDER — LEVOTHYROXINE SODIUM 112 MCG PO TABS
112.0000 ug | ORAL_TABLET | Freq: Every day | ORAL | Status: DC
  Administered 2015-02-11 – 2015-02-12 (×2): 112 ug via ORAL
  Filled 2015-02-10 (×2): qty 1

## 2015-02-10 MED ORDER — SODIUM CHLORIDE 0.9 % IJ SOLN: 3.0000 mL | Freq: Two times a day (BID) | INTRAMUSCULAR | Status: DC

## 2015-02-10 MED ORDER — HYPROMELLOSE (GONIOSCOPIC) 2.5 % OP SOLN: 1.0000 [drp] | Freq: Four times a day (QID) | OPHTHALMIC | Status: DC | PRN

## 2015-02-10 MED ORDER — MECLIZINE HCL 25 MG PO TABS
25.0000 mg | ORAL_TABLET | Freq: Three times a day (TID) | ORAL | Status: DC
  Administered 2015-02-10 – 2015-02-12 (×7): 25 mg via ORAL
  Filled 2015-02-10 (×7): qty 1

## 2015-02-10 MED ORDER — BENZONATATE 100 MG PO CAPS
100.0000 mg | ORAL_CAPSULE | Freq: Three times a day (TID) | ORAL | Status: DC | PRN
  Administered 2015-02-10 – 2015-02-12 (×6): 100 mg via ORAL
  Filled 2015-02-10 (×6): qty 1

## 2015-02-10 NOTE — ED Provider Notes (Addendum)
CSN: [id number]     Arrival date & time 02/10/15  0820 History   First MD Initiated Contact with Patient 02/10/15 [phone]     Chief Complaint  Patient presents with  . Fall    HPI Patient presents to the emergency room for evaluation of a recurrent fall. Patient was seen in the emergency room on December 20. The patient slipped while getting out of the bathtub and fell onto the ground. She landed on her right side. Patient had multiple areas of tenderness. She was seen in the emergency department and had an evaluation that included multiple x-rays and scans. She had x-rays of her right shoulder, right wrist, pelvis and hips and CAT scans of her head and neck. Patient was released back home. This morning she was getting off the toilet when she started to feel very dizzy and lightheaded. Patient tried to call for a caregiver. She had a syncopal episode and She remembers is lying down on the ground. She thinks she hit her head again. She is complaining of pain in the left face.  She also is having pain in her right elbow. She denies any trouble shortness of breath but has had some mild anterior chest discomfort.  Past Medical History  Diagnosis Date  . Falls frequently   . Arthritis   . Hypertension   . Fracture, clavicle   . Thyroid disease   . History of fractured vertebra   . Wrist fracture, right   . Seasonal allergies   . Depression   . Anxiety   . GERD (gastroesophageal reflux disease)   . Vertigo    Past Surgical History  Procedure Laterality Date  . Thyroidectomy    . Abdominal hysterectomy    . Back surgery    . Cholecystectomy    . Wrist surgery    . Fracture surgery     Family History  Problem Relation Age of Onset  . Hypertension Other    Social History  Substance Use Topics  . Smoking status: Never Smoker   . Smokeless tobacco: Never Used  . Alcohol Use: No   OB History    No data available     Review of Systems  All other systems reviewed and are  negative.     Allergies  Codeine; Cortisone; and Other  Home Medications   Prior to Admission medications   Medication Sig Start Date End Date Taking? Authorizing Provider  albuterol (PROVENTIL HFA;VENTOLIN HFA) 108 (90 BASE) MCG/ACT inhaler Inhale 2 puffs into the lungs every 4 (four) hours as needed for wheezing or shortness of breath (coough). 01/29/15  Yes Gareth Morgan, MD  Alcaftadine (LASTACAFT) 0.25 % SOLN Place 1 drop into both eyes at bedtime.   Yes Historical Provider, MD  antiseptic oral rinse (BIOTENE) LIQD 15 mLs by Mouth Rinse route 3 (three) times daily.   Yes Historical Provider, MD  benzocaine-resorcinol (VAGISIL) 5-2 % vaginal cream Place 1 application vaginally as needed for itching.   Yes Historical Provider, MD  benzonatate (TESSALON) 100 MG capsule Take 1 capsule (100 mg total) by mouth 3 (three) times daily as needed for cough. 01/29/15  Yes Gareth Morgan, MD  bismuth subsalicylate (PEPTO BISMOL) 262 MG/15ML suspension Take 30 mLs by mouth every 4 (four) hours as needed for diarrhea or loose stools.   Yes Historical Provider, MD  calcium-vitamin D (OSCAL WITH D) 500-200 MG-UNIT tablet Take 1 tablet by mouth daily.   Yes Historical Provider, MD  cetirizine (ZYRTEC) 10 MG tablet  Take 10 mg by mouth every evening.   Yes Historical Provider, MD  chlorhexidine (PERIDEX) 0.12 % solution Swish and spit 15 mLs 2 (two) times daily.   Yes Historical Provider, MD  FLUoxetine (PROZAC) 20 MG capsule Take 60 mg by mouth daily.   Yes Historical Provider, MD  fluticasone (FLONASE) 50 MCG/ACT nasal spray Place 2 sprays into both nostrils daily.   Yes Historical Provider, MD  hydroxypropyl methylcellulose / hypromellose (ISOPTO TEARS / GONIOVISC) 2.5 % ophthalmic solution 1 drop 4 (four) times daily as needed for dry eyes.   Yes Historical Provider, MD  Infant Care Products (JOHNSONS BABY SHAMPOO EX) Apply topically. Dilute a pea size amount with water; close right eye, wash and  rinse. Repeat twice daily for 2 days prior to injection.   Yes Historical Provider, MD  levothyroxine (SYNTHROID, LEVOTHROID) 112 MCG tablet Take 112 mcg by mouth daily before breakfast.   Yes Historical Provider, MD  loperamide (IMODIUM) 2 MG capsule Take 2 mg by mouth. After each loose stool   Yes Historical Provider, MD  meclizine (ANTIVERT) 25 MG tablet Take 1 tablet (25 mg total) by mouth every 8 (eight) hours. 11/12/14  Yes Bonnielee Haff, MD  methocarbamol (ROBAXIN) 750 MG tablet Take 1,500 mg by mouth every 8 (eight) hours.   Yes Historical Provider, MD  Multiple Vitamins-Minerals (PRESERVISION AREDS) CAPS Take 1 capsule by mouth 2 (two) times daily.   Yes Historical Provider, MD  Nutritional Supplements (NUTRITIONAL DRINK MIX PO) Take 1 Dose by mouth daily. Mighty shake   Yes Historical Provider, MD  ondansetron (ZOFRAN) 8 MG tablet Take 8 mg by mouth every 8 (eight) hours as needed for nausea or vomiting.   Yes Historical Provider, MD  pantoprazole (PROTONIX) 20 MG tablet Take 20 mg by mouth 2 (two) times daily.   Yes Historical Provider, MD  tobramycin (TOBREX) 0.3 % ophthalmic solution Place 1 drop into the right eye 4 (four) times daily as needed (the day prior, the day of, and the day after treatment.).    Yes Historical Provider, MD  traZODone (DESYREL) 100 MG tablet Take 200 mg by mouth at bedtime.    Yes Historical Provider, MD  triamcinolone cream (KENALOG) 0.1 % Apply 1 application topically every 6 (six) hours as needed (itching).   Yes Historical Provider, MD  triamterene-hydrochlorothiazide (MAXZIDE) 75-50 MG tablet Take 1 tablet by mouth daily.   Yes Historical Provider, MD  trolamine salicylate (ASPERCREME) 10 % cream Apply 1 application topically every 8 (eight) hours as needed for muscle pain.    Yes Historical Provider, MD  docusate sodium (COLACE) 100 MG capsule Take 1 capsule (100 mg total) by mouth 2 (two) times daily. Patient not taking: Reported on 01/29/2015 11/12/14    Bonnielee Haff, MD  methocarbamol (ROBAXIN) 500 MG tablet Take 2 tablets (1,000 mg total) by mouth every 8 (eight) hours as needed for muscle spasms. Patient not taking: Reported on 11/10/2014 06/06/13   Kinnie Feil, MD  mupirocin nasal ointment (BACTROBAN NASAL) 2 % Place 1 application into the nose 2 (two) times daily. For five (5) days. Press sides of nose together and gently massage. Patient not taking: Reported on 11/21/2014 11/12/14   Bonnielee Haff, MD  polyethylene glycol (MIRALAX / GLYCOLAX) packet Take 17 g by mouth 2 (two) times daily. Patient not taking: Reported on 11/21/2014 11/12/14   Bonnielee Haff, MD   BP 166/70 mmHg  Pulse 67  Temp(Src) 97.4 F (36.3 C) (Oral)  Resp 11  SpO2 97% Physical Exam  Constitutional: No distress.  Elderly, frail  HENT:  Head: Normocephalic.  Right Ear: External ear normal.  Left Ear: External ear normal.  Ecchymoses noted on the left forehead and around the right eye adjacent to a small sutured laceration below her eye  Eyes: Conjunctivae are normal. Right eye exhibits no discharge. Left eye exhibits no discharge. No scleral icterus.  Neck: Neck supple. No tracheal deviation present.  No cervical spine tenderness  Cardiovascular: Normal rate, regular rhythm and intact distal pulses.   Pulmonary/Chest: Effort normal and breath sounds normal. No stridor. No respiratory distress. She has no wheezes. She has no rales.  Abdominal: Soft. Bowel sounds are normal. She exhibits no distension. There is no tenderness. There is no rebound and no guarding.  Musculoskeletal: She exhibits no edema.       Right elbow: She exhibits decreased range of motion. She exhibits no swelling and no effusion. Tenderness found. Radial head tenderness noted.  Neurological: She is alert. She has normal strength. No cranial nerve deficit (no facial droop, extraocular movements intact, no slurred speech) or sensory deficit. She exhibits normal muscle tone. She displays no  seizure activity. Coordination normal.  Skin: Skin is warm and dry. No rash noted.  Psychiatric: She has a normal mood and affect.  Nursing note and vitals reviewed.   ED Course  Procedures (including critical care time) Labs Review Labs Reviewed  BASIC METABOLIC PANEL - Abnormal; Notable for the following:    Sodium 134 (*)    Potassium 3.4 (*)    Chloride 91 (*)    CO2 33 (*)    Glucose, Bld 137 (*)    All other components within normal limits  URINALYSIS, ROUTINE W REFLEX MICROSCOPIC (NOT AT [facility]) - Abnormal; Notable for the following:    Color, Urine AMBER (*)    Leukocytes, UA SMALL (*)    All other components within normal limits  URINE MICROSCOPIC-ADD ON - Abnormal; Notable for the following:    Squamous Epithelial / LPF 0-5 (*)    Bacteria, UA FEW (*)    Casts HYALINE CASTS (*)    All other components within normal limits  CBG MONITORING, ED - Abnormal; Notable for the following:    Glucose-Capillary 127 (*)    All other components within normal limits  CBC WITH DIFFERENTIAL/PLATELET  PROTIME-INR  POCT CBG (FASTING - GLUCOSE)-MANUAL ENTRY  I-STAT TROPOININ, ED  TYPE AND SCREEN  ABO/RH    Imaging Review Dg Chest 2 View  02/10/2015  CLINICAL DATA:  Recent fall with elbow pain and right shoulder pain, initial encounter EXAM: CHEST - 2 VIEW COMPARISON:  01/29/2015 FINDINGS: Cardiac shadow is within normal limits. Elevation the right hemidiaphragm is again seen. No focal infiltrate or sizable effusion is seen. Chronic changes in the proximal left humerus are noted. Chronic and treated vertebral compression deformities are again seen. IMPRESSION: No acute abnormality noted. Electronically Signed   By: Inez Catalina M.D.   On: 02/10/2015 09:42   Dg Shoulder Right  02/09/2015  CLINICAL DATA:  Find status post trip and fall today in the bathroom with a right shoulder injury and pain. Initial encounter. EXAM: RIGHT SHOULDER - 2+ VIEW COMPARISON:  None. FINDINGS: No acute bony  or joint abnormality is identified. Small calcifications projecting over the humeral head are likely due to calcific rotator cuff tendinopathy. The humeral head is somewhat high-riding. Mild acromioclavicular degenerative change is seen. Image right lung and ribs are unremarkable. IMPRESSION: No  acute abnormality. High-riding humeral head compatible with chronic rotator cuff tear. Findings suggestive of calcific rotator cuff tendinopathy also identified. Electronically Signed   By: Inge Rise M.D.   On: 02/09/2015 12:13   Dg Elbow Complete Right  02/10/2015  CLINICAL DATA:  Recent fall with right elbow pain EXAM: RIGHT ELBOW - COMPLETE 3+ VIEW COMPARISON:  None. FINDINGS: There is a mildly depressed radial head fracture with mild elevation the anterior fat pad. No other focal abnormality is seen. IMPRESSION: Mildly depressed radial head fracture Electronically Signed   By: Inez Catalina M.D.   On: 02/10/2015 09:44   Dg Wrist Complete Right  02/09/2015  CLINICAL DATA:  Right wrist pain, tripped over a towel in the shower today EXAM: RIGHT WRIST - COMPLETE 3+ VIEW COMPARISON:  02/16/2013 FINDINGS: Four views of the right wrist submitted. No acute fracture or subluxation. There are degenerative changes with narrowing of radiocarpal joint space. Metallic fixation plate distal radius is unchanged in position. Degenerative changes first carpometacarpal joint. IMPRESSION: No acute fracture or subluxation. Stable postsurgical changes distal radius. Degenerative changes as described above. Electronically Signed   By: Lahoma Crocker M.D.   On: 02/09/2015 12:14   Ct Head Wo Contrast  02/10/2015  CLINICAL DATA:  Syncope with fall EXAM: CT HEAD WITHOUT CONTRAST TECHNIQUE: Contiguous axial images were obtained from the base of the skull through the vertex without intravenous contrast. COMPARISON:  February 09, 2015 FINDINGS: Slight generalized atrophy is stable. There is no intracranial mass, hemorrhage, extra-axial  fluid collection, or midline shift. There is mild small vessel disease in the centra semiovale bilaterally. Elsewhere gray-white compartments appear normal. No acute infarct evident. The bony calvarium appears intact. The mastoid air cells are clear. No intraorbital lesions are identified. IMPRESSION: Mild atrophy with slight periventricular small vessel disease. No intracranial mass, hemorrhage, or extra-axial fluid collection. No acute infarct evident. Electronically Signed   By: Lowella Grip III M.D.   On: 02/10/2015 09:26   Ct Head Wo Contrast  02/09/2015  CLINICAL DATA:  Patient status post fall from standing. Right facial laceration. No reported loss of consciousness. EXAM: CT HEAD WITHOUT CONTRAST CT CERVICAL SPINE WITHOUT CONTRAST TECHNIQUE: Multidetector CT imaging of the head and cervical spine was performed following the standard protocol without intravenous contrast. Multiplanar CT image reconstructions of the cervical spine were also generated. COMPARISON:  MRI brain 11/21/2014.  CT head 11/10/2014. FINDINGS: CT HEAD FINDINGS Ventricles and sulci are appropriate for patient's age. No evidence for acute cortically based infarct, intracranial hemorrhage, mass lesion or mass-effect. Orbits are unremarkable. Small amount of fluid within the right maxillary sinus. Remainder of the paranasal sinuses are unremarkable. Mastoid air cells are well aerated. The calvarium is intact. CT CERVICAL SPINE FINDINGS Straightening of the normal cervical lordosis. Degenerative disc disease most pronounced C5-6. Multilevel facet degenerative changes bilaterally. Preservation the vertebral body heights. Craniocervical junction is intact. No evidence for acute cervical spine fracture. There is a 2.0 x 1.4 cm soft tissue mass within the right peritracheal location (image 60; series 5). IMPRESSION: No acute intracranial process. No acute cervical spine fracture. 2 cm right peritracheal mass near the expected location of  the thyroid. Recommend correlation with neck ultrasound. Electronically Signed   By: Lovey Newcomer M.D.   On: 02/09/2015 12:49   Ct Cervical Spine Wo Contrast  02/09/2015  CLINICAL DATA:  Patient status post fall from standing. Right facial laceration. No reported loss of consciousness. EXAM: CT HEAD WITHOUT CONTRAST CT CERVICAL SPINE  WITHOUT CONTRAST TECHNIQUE: Multidetector CT imaging of the head and cervical spine was performed following the standard protocol without intravenous contrast. Multiplanar CT image reconstructions of the cervical spine were also generated. COMPARISON:  MRI brain 11/21/2014.  CT head 11/10/2014. FINDINGS: CT HEAD FINDINGS Ventricles and sulci are appropriate for patient's age. No evidence for acute cortically based infarct, intracranial hemorrhage, mass lesion or mass-effect. Orbits are unremarkable. Small amount of fluid within the right maxillary sinus. Remainder of the paranasal sinuses are unremarkable. Mastoid air cells are well aerated. The calvarium is intact. CT CERVICAL SPINE FINDINGS Straightening of the normal cervical lordosis. Degenerative disc disease most pronounced C5-6. Multilevel facet degenerative changes bilaterally. Preservation the vertebral body heights. Craniocervical junction is intact. No evidence for acute cervical spine fracture. There is a 2.0 x 1.4 cm soft tissue mass within the right peritracheal location (image 60; series 5). IMPRESSION: No acute intracranial process. No acute cervical spine fracture. 2 cm right peritracheal mass near the expected location of the thyroid. Recommend correlation with neck ultrasound. Electronically Signed   By: Lovey Newcomer M.D.   On: 02/09/2015 12:49   Dg Hip Unilat With Pelvis 2-3 Views Right  02/09/2015  CLINICAL DATA:  Post fall on tall getting out of shower today, now with right hip pain. EXAM: DG HIP (WITH OR WITHOUT PELVIS) 2-3V RIGHT COMPARISON:  None. FINDINGS: Osteopenia without displaced fracture.  No  dislocation. Mild degenerative change of the right hip with joint space loss, subchondral sclerosis and osteophytosis. No evidence avascular necrosis. Limited visualization of the adjacent pelvis is normal. Suspected L5 laminectomy, incompletely evaluated. At least moderate degenerative change of the contralateral hip is suspected though incompletely evaluated. Several phleboliths overlie the right hemipelvis. Regional soft tissues appear otherwise normal. IMPRESSION: No fracture or dislocation. Electronically Signed   By: Sandi Mariscal M.D.   On: 02/09/2015 12:12   I have personally reviewed and evaluated these images and lab results as part of my medical decision-making.   EKG Interpretation   Date/Time:  Wednesday February 10 2015 08:33:59 EST Ventricular Rate:  64 PR Interval:    QRS Duration: 95 QT Interval:  440 QTC Calculation: [id number] Axis:   16 Text Interpretation:  Normal sinus rhythm No significant change since last  tracing Artifact Confirmed by Medora Roorda  MD-J, Kindel Rochefort [id number]) on 02/10/2015  8:48:53 AM      MDM   Final diagnoses:  Syncope, unspecified syncope type  Radial head fracture, closed, right, initial encounter    Patient presented to the emergency room after a syncopal episode. It's possible this may have been vasovagal in nature. However the patient did complain of some chest pain earlier. No evidence of acute cardiac ischemia at this point. Considering her age and recurrent falls, I will consult the MEDICAL service regarding admission for observation and cardiac monitoring  Patient does have a recurrent headache and struck her head again. CAT scan does not show any acute injuries.  Patient is complaining of elbow pain. X-ray does show a radial head fracture. Possibly this occurred yesterday versus from the fall today.   Will splint and consult orthopedics  Dorie Rank, MD 02/10/15 1051  Spoke with Dr Erlinda Hong.  Pt can remain in the splint and follow up with him as an  outpatient.  Dorie Rank, MD 02/10/15 1155

## 2015-02-10 NOTE — ED Notes (Signed)
Patient presents from assisted living following syncopal episode this morning where she fell off the toilet.  Patient states prior to syncope, she felt hot and dizzy.  Patient managed to call for help before becoming unconscious.  Patient c/o left head pain and right arm, shoulder and head pain from a fall yesterday.    On exam, General: Awake, alert,well-nourished, NAD Cardio: RRR, S1S2 heard, no murmurs appreciated, no LE edema, cyanosis or clubbing; +1 radial and dorsalis pulses bilaterally Pulm: CTAB, no wheezes, rhonchi or rales GI: Soft, NT/ND, diminished BS Neuro: Strength and sensation grossly intact, MAE, ambulatory, PERRL 10mm

## 2015-02-10 NOTE — H&P (Signed)
[facility] History and Physical  Erika Conley [id number] DOB: 05/06/1934 DOA: 02/10/2015  Referring physician: ED PCP: Leonard Downing, MD  Specialists: none  80 y/o ? known h/o syncope/Orthostasis Hypothyroid Acute encephalopathy in the past Htn MDD L4-L5 radiculopathy  At baseline she lives at [facility] and has been there for the past 2 years he could she was having recurrent falls in her apartment. She's been diagnosed with prostatic hypertension in the past and usually feels dizzy prior to having a fall. She presented initially on 12/20 and was discharged back to her facility after a fall when she was bathing in the morning at around 10:30 she slipped and tripped-she came to the emergency room and was noted to have a laceration in the perioral vital area of the right face and the suture was placed in her lower maxillary region  She went back home to the facility and got up in the morning of 12/21 to wash her face and it was noted that patient felt nauseous and dizzy at the same time and sat down on 12 She knew she did fall onto the floor and hit the left side of her face  Tells me she's had chronic tinnitus for while and used to see a Dr. 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 Falls frequently   . Arthritis   .  Hypertension   . Fracture, clavicle   . Thyroid disease   . History of fractured vertebra   . Wrist fracture, right   . Seasonal allergies   . Depression   . Anxiety   . GERD (gastroesophageal reflux disease)   . Vertigo    Past Surgical History  Procedure Laterality Date  . Thyroidectomy    . Abdominal hysterectomy    . Back surgery    . Cholecystectomy    . Wrist surgery    . Fracture surgery     Social History:  Social History   Social History Narrative    Allergies  Allergen Reactions  . Codeine     Pass out, fall  . Cortisone     Pass out, fall   . Other     All narcotics cause patient to Pass out, fall     Family History  Problem Relation Age of Onset  . Hypertension Other     Prior to Admission medications   Medication Sig Start Date End Date Taking? Authorizing Provider  albuterol (PROVENTIL HFA;VENTOLIN HFA) 108 (90 BASE) MCG/ACT inhaler Inhale 2 puffs into the lungs every 4 (four) hours as needed for wheezing or shortness of breath (coough). 01/29/15  Yes Gareth Morgan, MD  Alcaftadine (LASTACAFT) 0.25 % SOLN Place 1 drop into both eyes at bedtime.   Yes Historical Provider, MD  antiseptic oral  rinse (BIOTENE) LIQD 15 mLs by Mouth Rinse route 3 (three) times daily.   Yes Historical Provider, MD  benzocaine-resorcinol (VAGISIL) 5-2 % vaginal cream Place 1 application vaginally as needed for itching.   Yes Historical Provider, MD  benzonatate (TESSALON) 100 MG capsule Take 1 capsule (100 mg total) by mouth 3 (three) times daily as needed for cough. 01/29/15  Yes Gareth Morgan, MD  bismuth subsalicylate (PEPTO BISMOL) 262 MG/15ML suspension Take 30 mLs by mouth every 4 (four) hours as needed for diarrhea or loose stools.   Yes Historical Provider, MD  calcium-vitamin D (OSCAL WITH D) 500-200 MG-UNIT tablet Take 1 tablet by mouth daily.   Yes Historical Provider, MD  cetirizine (ZYRTEC) 10 MG tablet Take 10 mg by mouth every evening.   Yes Historical  Provider, MD  chlorhexidine (PERIDEX) 0.12 % solution Swish and spit 15 mLs 2 (two) times daily.   Yes Historical Provider, MD  FLUoxetine (PROZAC) 20 MG capsule Take 60 mg by mouth daily.   Yes Historical Provider, MD  fluticasone (FLONASE) 50 MCG/ACT nasal spray Place 2 sprays into both nostrils daily.   Yes Historical Provider, MD  hydroxypropyl methylcellulose / hypromellose (ISOPTO TEARS / GONIOVISC) 2.5 % ophthalmic solution 1 drop 4 (four) times daily as needed for dry eyes.   Yes Historical Provider, MD  Infant Care Products (JOHNSONS BABY SHAMPOO EX) Apply topically. Dilute a pea size amount with water; close right eye, wash and rinse. Repeat twice daily for 2 days prior to injection.   Yes Historical Provider, MD  levothyroxine (SYNTHROID, LEVOTHROID) 112 MCG tablet Take 112 mcg by mouth daily before breakfast.   Yes Historical Provider, MD  loperamide (IMODIUM) 2 MG capsule Take 2 mg by mouth. After each loose stool   Yes Historical Provider, MD  meclizine (ANTIVERT) 25 MG tablet Take 1 tablet (25 mg total) by mouth every 8 (eight) hours. 11/12/14  Yes Bonnielee Haff, MD  methocarbamol (ROBAXIN) 750 MG tablet Take 1,500 mg by mouth every 8 (eight) hours.   Yes Historical Provider, MD  Multiple Vitamins-Minerals (PRESERVISION AREDS) CAPS Take 1 capsule by mouth 2 (two) times daily.   Yes Historical Provider, MD  Nutritional Supplements (NUTRITIONAL DRINK MIX PO) Take 1 Dose by mouth daily. Mighty shake   Yes Historical Provider, MD  ondansetron (ZOFRAN) 8 MG tablet Take 8 mg by mouth every 8 (eight) hours as needed for nausea or vomiting.   Yes Historical Provider, MD  pantoprazole (PROTONIX) 20 MG tablet Take 20 mg by mouth 2 (two) times daily.   Yes Historical Provider, MD  tobramycin (TOBREX) 0.3 % ophthalmic solution Place 1 drop into the right eye 4 (four) times daily as needed (the day prior, the day of, and the day after treatment.).    Yes Historical Provider, MD  traZODone (DESYREL)  100 MG tablet Take 200 mg by mouth at bedtime.    Yes Historical Provider, MD  triamcinolone cream (KENALOG) 0.1 % Apply 1 application topically every 6 (six) hours as needed (itching).   Yes Historical Provider, MD  triamterene-hydrochlorothiazide (MAXZIDE) 75-50 MG tablet Take 1 tablet by mouth daily.   Yes Historical Provider, MD  trolamine salicylate (ASPERCREME) 10 % cream Apply 1 application topically every 8 (eight) hours as needed for muscle pain.    Yes Historical Provider, MD  docusate sodium (COLACE) 100 MG capsule Take 1 capsule (100 mg total) by mouth 2 (two) times daily. Patient not taking: Reported on 01/29/2015 11/12/14   Bonnielee Haff,  MD  methocarbamol (ROBAXIN) 500 MG tablet Take 2 tablets (1,000 mg total) by mouth every 8 (eight) hours as needed for muscle spasms. Patient not taking: Reported on 11/10/2014 06/06/13   Kinnie Feil, MD  mupirocin nasal ointment (BACTROBAN NASAL) 2 % Place 1 application into the nose 2 (two) times daily. For five (5) days. Press sides of nose together and gently massage. Patient not taking: Reported on 11/21/2014 11/12/14   Bonnielee Haff, MD  polyethylene glycol (MIRALAX / GLYCOLAX) packet Take 17 g by mouth 2 (two) times daily. Patient not taking: Reported on 11/21/2014 11/12/14   Bonnielee Haff, MD   Physical Exam: Filed Vitals:   02/10/15 0939 02/10/15 1000 02/10/15 1100 02/10/15 1159  BP: 166/70 157/74 137/70 140/67  Pulse:  63 62 63  Temp:      TempSrc:      Resp: 11 11 20 16   SpO2:  96% 95% 98%    Alert pleasant oriented per her bipolar edema pain and swelling on the left and right eyes I do not appreciate any nystagmus however patient does feel dizzy at the boundaries of her vision on direct visual testing EOMI NCAT Edentulous No JVD No bruit S1-S2 no murmur rub or gallop Chest clinically clear Abdomen soft nontender nondistended no rebound Right arm is splinted and hence I'm unable to examine Power lower extremities within  normal limits  Labs on Admission:  Basic Metabolic Panel:  Recent Labs Lab 02/10/15 0900  NA 134*  K 3.4*  CL 91*  CO2 33*  GLUCOSE 137*  BUN 10  CREATININE 0.84  CALCIUM 9.2   Liver Function Tests: No results for input(s): AST, ALT, ALKPHOS, BILITOT, PROT, ALBUMIN in the last 168 hours. No results for input(s): LIPASE, AMYLASE in the last 168 hours. No results for input(s): AMMONIA in the last 168 hours. CBC:  Recent Labs Lab 02/10/15 0900  WBC 8.0  NEUTROABS 6.5  HGB 14.9  HCT 41.7  MCV 85.8  PLT 218   Cardiac Enzymes: No results for input(s): CKTOTAL, CKMB, CKMBINDEX, TROPONINI in the last 168 hours.  BNP (last 3 results) No results for input(s): BNP in the last 8760 hours.  ProBNP (last 3 results) No results for input(s): PROBNP in the last 8760 hours.  CBG:  Recent Labs Lab 02/10/15 0850  GLUCAP 127*    Radiological Exams on Admission: Dg Chest 2 View  02/10/2015  CLINICAL DATA:  Recent fall with elbow pain and right shoulder pain, initial encounter EXAM: CHEST - 2 VIEW COMPARISON:  01/29/2015 FINDINGS: Cardiac shadow is within normal limits. Elevation the right hemidiaphragm is again seen. No focal infiltrate or sizable effusion is seen. Chronic changes in the proximal left humerus are noted. Chronic and treated vertebral compression deformities are again seen. IMPRESSION: No acute abnormality noted. Electronically Signed   By: Inez Catalina M.D.   On: 02/10/2015 09:42   Dg Shoulder Right  02/09/2015  CLINICAL DATA:  Find status post trip and fall today in the bathroom with a right shoulder injury and pain. Initial encounter. EXAM: RIGHT SHOULDER - 2+ VIEW COMPARISON:  None. FINDINGS: No acute bony or joint abnormality is identified. Small calcifications projecting over the humeral head are likely due to calcific rotator cuff tendinopathy. The humeral head is somewhat high-riding. Mild acromioclavicular degenerative change is seen. Image right lung and  ribs are unremarkable. IMPRESSION: No acute abnormality. High-riding humeral head compatible with chronic rotator cuff tear. Findings suggestive of calcific rotator cuff tendinopathy also identified.  Electronically Signed   By: Inge Rise M.D.   On: 02/09/2015 12:13   Dg Elbow Complete Right  02/10/2015  CLINICAL DATA:  Recent fall with right elbow pain EXAM: RIGHT ELBOW - COMPLETE 3+ VIEW COMPARISON:  None. FINDINGS: There is a mildly depressed radial head fracture with mild elevation the anterior fat pad. No other focal abnormality is seen. IMPRESSION: Mildly depressed radial head fracture Electronically Signed   By: Inez Catalina M.D.   On: 02/10/2015 09:44   Dg Wrist Complete Right  02/09/2015  CLINICAL DATA:  Right wrist pain, tripped over a towel in the shower today EXAM: RIGHT WRIST - COMPLETE 3+ VIEW COMPARISON:  02/16/2013 FINDINGS: Four views of the right wrist submitted. No acute fracture or subluxation. There are degenerative changes with narrowing of radiocarpal joint space. Metallic fixation plate distal radius is unchanged in position. Degenerative changes first carpometacarpal joint. IMPRESSION: No acute fracture or subluxation. Stable postsurgical changes distal radius. Degenerative changes as described above. Electronically Signed   By: Lahoma Crocker M.D.   On: 02/09/2015 12:14   Ct Head Wo Contrast  02/10/2015  CLINICAL DATA:  Syncope with fall EXAM: CT HEAD WITHOUT CONTRAST TECHNIQUE: Contiguous axial images were obtained from the base of the skull through the vertex without intravenous contrast. COMPARISON:  February 09, 2015 FINDINGS: Slight generalized atrophy is stable. There is no intracranial mass, hemorrhage, extra-axial fluid collection, or midline shift. There is mild small vessel disease in the centra semiovale bilaterally. Elsewhere gray-white compartments appear normal. No acute infarct evident. The bony calvarium appears intact. The mastoid air cells are clear. No  intraorbital lesions are identified. IMPRESSION: Mild atrophy with slight periventricular small vessel disease. No intracranial mass, hemorrhage, or extra-axial fluid collection. No acute infarct evident. Electronically Signed   By: Lowella Grip III M.D.   On: 02/10/2015 09:26   Ct Head Wo Contrast  02/09/2015  CLINICAL DATA:  Patient status post fall from standing. Right facial laceration. No reported loss of consciousness. EXAM: CT HEAD WITHOUT CONTRAST CT CERVICAL SPINE WITHOUT CONTRAST TECHNIQUE: Multidetector CT imaging of the head and cervical spine was performed following the standard protocol without intravenous contrast. Multiplanar CT image reconstructions of the cervical spine were also generated. COMPARISON:  MRI brain 11/21/2014.  CT head 11/10/2014. FINDINGS: CT HEAD FINDINGS Ventricles and sulci are appropriate for patient's age. No evidence for acute cortically based infarct, intracranial hemorrhage, mass lesion or mass-effect. Orbits are unremarkable. Small amount of fluid within the right maxillary sinus. Remainder of the paranasal sinuses are unremarkable. Mastoid air cells are well aerated. The calvarium is intact. CT CERVICAL SPINE FINDINGS Straightening of the normal cervical lordosis. Degenerative disc disease most pronounced C5-6. Multilevel facet degenerative changes bilaterally. Preservation the vertebral body heights. Craniocervical junction is intact. No evidence for acute cervical spine fracture. There is a 2.0 x 1.4 cm soft tissue mass within the right peritracheal location (image 60; series 5). IMPRESSION: No acute intracranial process. No acute cervical spine fracture. 2 cm right peritracheal mass near the expected location of the thyroid. Recommend correlation with neck ultrasound. Electronically Signed   By: Lovey Newcomer M.D.   On: 02/09/2015 12:49   Ct Cervical Spine Wo Contrast  02/09/2015  CLINICAL DATA:  Patient status post fall from standing. Right facial  laceration. No reported loss of consciousness. EXAM: CT HEAD WITHOUT CONTRAST CT CERVICAL SPINE WITHOUT CONTRAST TECHNIQUE: Multidetector CT imaging of the head and cervical spine was performed following the standard protocol without intravenous  contrast. Multiplanar CT image reconstructions of the cervical spine were also generated. COMPARISON:  MRI brain 11/21/2014.  CT head 11/10/2014. FINDINGS: CT HEAD FINDINGS Ventricles and sulci are appropriate for patient's age. No evidence for acute cortically based infarct, intracranial hemorrhage, mass lesion or mass-effect. Orbits are unremarkable. Small amount of fluid within the right maxillary sinus. Remainder of the paranasal sinuses are unremarkable. Mastoid air cells are well aerated. The calvarium is intact. CT CERVICAL SPINE FINDINGS Straightening of the normal cervical lordosis. Degenerative disc disease most pronounced C5-6. Multilevel facet degenerative changes bilaterally. Preservation the vertebral body heights. Craniocervical junction is intact. No evidence for acute cervical spine fracture. There is a 2.0 x 1.4 cm soft tissue mass within the right peritracheal location (image 60; series 5). IMPRESSION: No acute intracranial process. No acute cervical spine fracture. 2 cm right peritracheal mass near the expected location of the thyroid. Recommend correlation with neck ultrasound. Electronically Signed   By: Lovey Newcomer M.D.   On: 02/09/2015 12:49   Dg Hip Unilat With Pelvis 2-3 Views Right  02/09/2015  CLINICAL DATA:  Post fall on tall getting out of shower today, now with right hip pain. EXAM: DG HIP (WITH OR WITHOUT PELVIS) 2-3V RIGHT COMPARISON:  None. FINDINGS: Osteopenia without displaced fracture.  No dislocation. Mild degenerative change of the right hip with joint space loss, subchondral sclerosis and osteophytosis. No evidence avascular necrosis. Limited visualization of the adjacent pelvis is normal. Suspected L5 laminectomy, incompletely  evaluated. At least moderate degenerative change of the contralateral hip is suspected though incompletely evaluated. Several phleboliths overlie the right hemipelvis. Regional soft tissues appear otherwise normal. IMPRESSION: No fracture or dislocation. Electronically Signed   By: Sandi Mariscal M.D.   On: 02/09/2015 12:12      Syncope-observe on telemetry Patient's symptoms and findings are more in keeping with paroxysmal vertigo and I will ask therapy to see the patient for vestibular rehabilitation I would continue patient's meclizine for now Therapy altered clear the patient for discharge Follow telemetry and monitor for pauses or any other symptoms At this stage I would hold off on echocardiogram For now we will stop patient's HCTZ  Right elbow fracture, radial head Orthopedics consult pending Pain control with Vicodin which has helped her past-this is not contraindicated to her she's taken this before despite her codeine allergy  L4-L5 radiculopathy-monitor She is on pain medication I will continue her Robaxin 1500 3 times a day  Major depressive disorder Continue trazodone 200 daily at bedtime, Prozac 60 daily  Macular degeneration-continue eyedrops with tobramycin 4 times a day as well as replacement ketotifen 1 drop twice a day when necessary/p.m. continue Isopto Tears  Hypothyroidism-continue Synthroid 112 g every morning  Recent cough Okay to continue Tessalon 3 times a day  Alternating constipation and diarrhea- Consider outpatient colonoscopy On multiple constipating agent such as calcium For now hold off on Imodium consider only MiraLAX for now  DO NOT RESUSCITATE,  No family at the bedside Admit to the treatment overnight awaiting PT OT input   Verneita Griffes, MD [facility] [phone]

## 2015-02-10 NOTE — ED Notes (Signed)
Called and spoke with patient's son, [name], per her request.  Notified him that patient would be admitted.  [phone]

## 2015-02-10 NOTE — Evaluation (Signed)
Physical Therapy Evaluation Patient Details Name: Erika Conley MRN: [id number] DOB: [birth year] Today's Date: 02/10/2015   History of Present Illness  Pt is a 80 y.o. female with history of hypertension and hypothyroidism, known h/o syncope/orthostasis, vertigo admitted from [facility] after syncopal episode.  Also with hx of increased falls over the past 3 years per pt.  Clinical Impression  Pt admitted with above diagnosis. Pt currently with functional limitations due to the deficits listed below (see PT Problem List).  Pt will benefit from skilled PT to increase their independence and safety with mobility to allow discharge to the venue listed below.   Pt admitted after a few falls and sustained facial laceration and swelling/ecchymosis near L eye as well as R radial head fx.  CT head and cervical spine negative for acute findings.  Pt with admission in 10/2014 and seen by PT for R horizontal BPPV and possible L posterior canal BPPV.  Pt was not able to tolerate much vestibular testing today due to increased soreness from falls.  Pt reports dizziness as a spinning sensation with variable occurrence (can start laying in bed resting or while she is up moving).  She reports being still alleviates spinning.  She also reports hx of falls with syncopal episodes.  Pt states she wears trifocal corrective lenses which she does not have at present.  Attempted oculomotor exam however pt reports difficulty focusing and headache.       Follow Up Recommendations SNF;Supervision/Assistance - 24 hour    Equipment Recommendations  None recommended by PT    Recommendations for Other Services       Precautions / Restrictions Precautions Precautions: Fall Precaution Comments: orthostatic Required Braces or Orthoses: Sling Restrictions Other Position/Activity Restrictions: Dr. Verlon Au checking with ortho MD on WB status, maintained NWB R UE today      Mobility  Bed Mobility Overal bed  mobility: Needs Assistance Bed Mobility: Supine to Sit;Sit to Supine     Supine to sit: Min assist Sit to supine: Mod assist   General bed mobility comments: verbal cues for R UE NWB at this time, assist for trunk upright and for LEs onto bed  Transfers Overall transfer level: Needs assistance Equipment used: 1 person hand held assist Transfers: Sit to/from Stand;Stand Pivot Transfers Sit to Stand: Min assist Stand pivot transfers: Min assist       General transfer comment: assist to rise and steady, provided HHA for L UE, pt stood for orthostatics and then again to pivot to/from [facility], reporting pain "all over" 7/10 so did not feel able to tolerate ambulation today  Ambulation/Gait                Stairs            Wheelchair Mobility    Modified Rankin (Stroke Patients Only)       Balance Overall balance assessment: History of Falls                                           Pertinent Vitals/Pain Pain Assessment: 0-10 Pain Score: 7  Pain Location: "all over" Pain Descriptors / Indicators: Sore Pain Intervention(s): Limited activity within patient's tolerance;Monitored during session (RN in room and aware)    Home Living Family/patient expects to be discharged to:: [facility]  Equipment: Gilford Rile - 4 wheels;Cane - single point Additional Comments: from [facility], was amb with cane. amb to [employer], has a Stage manager    Prior Function Level of Independence: Needs assistance   Gait / Transfers Assistance Needed: uses cane or rollator  ADL's / Homemaking Assistance Needed: assist for dressing/bathing  Comments: states an aide assists her as needed     Hand Dominance        Extremity/Trunk Assessment   Upper Extremity Assessment: RUE deficits/detail RUE Deficits / Details: forearm in splint and sling in place         Lower Extremity Assessment: Generalized weakness          Communication   Communication: No difficulties  Cognition Arousal/Alertness: Awake/alert Behavior During Therapy: WFL for tasks assessed/performed Overall Cognitive Status: Within Functional Limits for tasks assessed                      General Comments      Exercises        Assessment/Plan    PT Assessment Patient needs continued PT services  PT Diagnosis Difficulty walking;Other (comment) (possible vertigo, hx of BPPV)   PT Problem List Decreased strength;Decreased activity tolerance;Decreased mobility;Decreased balance;Decreased knowledge of precautions;Pain;Other (comment) (possible vertigo)  PT Treatment Interventions Gait training;DME instruction;Balance training;Patient/family education;Functional mobility training;Therapeutic activities;Therapeutic exercise   PT Goals (Current goals can be found in the Care Plan section) Acute Rehab PT Goals PT Goal Formulation: With patient Time For Goal Achievement: 02/17/15 Potential to Achieve Goals: Fair    Frequency Min 3X/week   Barriers to discharge        Co-evaluation               End of Session Equipment Utilized During Treatment: Other (comment) (sling) Activity Tolerance: Patient limited by pain Patient left: in bed;with call bell/phone within reach;with bed alarm set Nurse Communication: Mobility status    Functional Assessment Tool Used: clinical judgement Functional Limitation: Mobility: Walking and moving around Mobility: Walking and Moving Around Current Status [id number]): At least 20 percent but less than 40 percent impaired, limited or restricted Mobility: Walking and Moving Around Goal Status [id number]): At least 1 percent but less than 20 percent impaired, limited or restricted    Time: [id number] PT Time Calculation (min) (ACUTE ONLY): 29 min   Charges:   PT Evaluation $Initial PT Evaluation Tier I: 1 Procedure     PT G Codes:   PT G-Codes **NOT FOR INPATIENT CLASS** Functional  Assessment Tool Used: clinical judgement Functional Limitation: Mobility: Walking and moving around Mobility: Walking and Moving Around Current Status [id number]): At least 20 percent but less than 40 percent impaired, limited or restricted Mobility: Walking and Moving Around Goal Status [phone]): At least 1 percent but less than 20 percent impaired, limited or restricted    Vianka Ertel,KATHrine E 02/10/2015, 3:17 PM Carmelia Bake, PT, DPT 02/10/2015 Pager: [phone]

## 2015-02-10 NOTE — ED Notes (Signed)
Bed: [id number] Expected date:  Expected time:  Means of arrival:  Comments: [id number] fall

## 2015-02-10 NOTE — ED Notes (Signed)
Per EMS - patient comes from [facility] after falling off toilet this morning.  Patient felt dizzy, rang her call bell and then woke up on floor face down with staff standing over her.  Patient c/o back pain (with some chronic back pain) and left facial and neck pain (which were against wall).  Patient was seen here for fall yesterday after tripping over a towel.  Patient's vitals WNL, 126/86, HR 64, 96% on RA.  CBG was 168.

## 2015-02-10 NOTE — ED Notes (Signed)
SPoke with Janett Billow CN on 4E. 20 min start at 1123. Mickel Baas RN aware

## 2015-02-10 NOTE — ED Notes (Signed)
Ortho tech called to apply splint. °

## 2015-02-11 DIAGNOSIS — I951 Orthostatic hypotension: Secondary | ICD-10-CM | POA: Diagnosis not present

## 2015-02-11 DIAGNOSIS — G9001 Carotid sinus syncope: Secondary | ICD-10-CM | POA: Diagnosis not present

## 2015-02-11 LAB — COMPREHENSIVE METABOLIC PANEL
ALK PHOS: 56 U/L (ref 38–126)
ALT: 12 U/L — ABNORMAL LOW (ref 14–54)
ANION GAP: 8 (ref 5–15)
AST: 22 U/L (ref 15–41)
Albumin: 3.6 g/dL (ref 3.5–5.0)
BUN: 9 mg/dL (ref 6–20)
CALCIUM: 8.9 mg/dL (ref 8.9–10.3)
CHLORIDE: 95 mmol/L — AB (ref 101–111)
CO2: 31 mmol/L (ref 22–32)
Creatinine, Ser: 0.77 mg/dL (ref 0.44–1.00)
GFR calc non Af Amer: >60 mL/min (ref >60)
Glucose, Bld: 132 mg/dL — ABNORMAL HIGH (ref 65–99)
POTASSIUM: 3.2 mmol/L — AB (ref 3.5–5.1)
SODIUM: 134 mmol/L — AB (ref 135–145)
Total Bilirubin: 0.6 mg/dL (ref 0.3–1.2)
Total Protein: 5.7 g/dL — ABNORMAL LOW (ref 6.5–8.1)

## 2015-02-11 LAB — CBC
HEMATOCRIT: 37.9 % (ref 36.0–46.0)
HEMOGLOBIN: 13.2 g/dL (ref 12.0–15.0)
MCH: 29.9 pg (ref 26.0–34.0)
MCHC: 34.8 g/dL (ref 30.0–36.0)
MCV: 85.9 fL (ref 78.0–100.0)
Platelets: 179 10*3/uL (ref 150–400)
RBC: 4.41 MIL/uL (ref 3.87–5.11)
RDW: 12.9 % (ref 11.5–15.5)
WBC: 5.7 10*3/uL (ref 4.0–10.5)

## 2015-02-11 MED ORDER — CHLORHEXIDINE GLUCONATE CLOTH 2 % EX PADS
6.0000 | MEDICATED_PAD | Freq: Every day | CUTANEOUS | Status: DC
  Administered 2015-02-11 – 2015-02-12 (×2): 6 via TOPICAL

## 2015-02-11 MED ORDER — MIDODRINE HCL 5 MG PO TABS
5.0000 mg | ORAL_TABLET | Freq: Three times a day (TID) | ORAL | Status: DC
  Administered 2015-02-11 – 2015-02-12 (×3): 5 mg via ORAL
  Filled 2015-02-11 (×6): qty 1

## 2015-02-11 MED ORDER — SODIUM CHLORIDE 0.9 % IV SOLN
1000.0000 mL | INTRAVENOUS | Status: DC
  Administered 2015-02-11 – 2015-02-12 (×2): 1000 mL via INTRAVENOUS

## 2015-02-11 MED ORDER — MUPIROCIN 2 % EX OINT
1.0000 "application " | TOPICAL_OINTMENT | Freq: Two times a day (BID) | CUTANEOUS | Status: DC
  Administered 2015-02-11 – 2015-02-12 (×3): 1 via NASAL
  Filled 2015-02-11: qty 22

## 2015-02-11 MED ORDER — PROMETHAZINE HCL 25 MG PO TABS: 12.5000 mg | ORAL_TABLET | Freq: Four times a day (QID) | ORAL | Status: DC | PRN

## 2015-02-11 NOTE — Progress Notes (Signed)
OT Cancellation Note  Patient Details Name: EMMELINE SHINE MRN: [id number] DOB: [birth year]   Cancelled Treatment:    Reason Eval/Treat Not Completed: Other (comment).  Have attempted twice this am:  Eating breakfast and now busy with nursing. Will return again today.  Amori Colomb 02/11/2015, 9:50 AM  Lesle Chris, OTR/L [phone] 02/11/2015

## 2015-02-11 NOTE — NC FL2 (Signed)
[place] LEVEL OF CARE SCREENING TOOL     IDENTIFICATION  Patient Name: Erika Conley Birthdate: [birth year] Sex: female Admission Date (Current Location): 02/10/2015  [facility] and [place] Number:  Herbalist and Address:  [facility],  [place] [address] [place]      Provider Number: [id number]  Attending Physician Name and Address:  Nita Sells, MD  Relative Name and Phone Number:       Current Level of Care: Hospital Recommended Level of Care: [place] Prior Approval Number:    Date Approved/Denied:   PASRR Number:    Discharge Plan: Other (Comment) (ALF)    Current Diagnoses: Patient Active Problem List   Diagnosis Date Noted  . Syncopal episodes 02/10/2015  . Hypokalemia 11/10/2014  . Hypertension 11/10/2014  . Hypothyroidism 11/10/2014  . Syncope 11/10/2014  . Orthostatic hypotension 11/10/2014  . Orthostasis   . Constipated   . Dehydration   . Dizziness   . UTI (urinary tract infection) 06/04/2013  . Altered mental status 06/04/2013    Orientation RESPIRATION BLADDER Height & Weight    Self, Time, Situation, Place  Normal Continent 4\' 9"  (144.8 cm) 136 lbs.  BEHAVIORAL SYMPTOMS/MOOD NEUROLOGICAL BOWEL NUTRITION STATUS   (no behaviors)  (NONE) Continent Diet (regular)  AMBULATORY STATUS COMMUNICATION OF NEEDS Skin   Limited Assist Verbally Normal                       Personal Care Assistance Level of Assistance  Bathing, Feeding, Dressing Bathing Assistance: Limited assistance Feeding assistance: Independent (needs assist with set up) Dressing Assistance: Limited assistance     Functional Limitations Info  Sight, Hearing, Speech Sight Info: Adequate Hearing Info: Adequate Speech Info: Adequate    SPECIAL CARE FACTORS FREQUENCY  PT (By licensed PT), OT (By licensed OT)     PT Frequency: 3 x a week OT Frequency: 2 x a week            Contractures  Contractures Info: Not present    Additional Factors Info  Code Status, Allergies, Psychotropic, Isolation Precautions Code Status Info: FULL code status Allergies Info: Codeine, Cortisone, Other Psychotropic Info: Prozac   Isolation Precautions Info: 02/10/15 mrsa by pcr     Current Medications (02/11/2015):  This is the current hospital active medication list Current Facility-Administered Medications  Medication Dose Route Frequency Provider Last Rate Last Dose  . 0.9 %  sodium chloride infusion  1,000 mL Intravenous Continuous Dorie Rank, MD 125 mL/hr at 02/11/15 0422 1,000 mL at 02/11/15 0422  . antiseptic oral rinse (CPC / CETYLPYRIDINIUM CHLORIDE 0.05%) solution 7 mL  7 mL Mouth Rinse q12n4p Nita Sells, MD   7 mL at 02/10/15 1730  . benzonatate (TESSALON) capsule 100 mg  100 mg Oral TID PRN Nita Sells, MD   100 mg at 02/11/15 0930  . calcium carbonate (TUMS - dosed in mg elemental calcium) chewable tablet 400 mg of elemental calcium  400 mg of elemental calcium Oral BID Nita Sells, MD   400 mg of elemental calcium at 02/11/15 0926  . calcium-vitamin D (OSCAL WITH D) 500-200 MG-UNIT per tablet 1 tablet  1 tablet Oral Daily Nita Sells, MD   1 tablet at 02/11/15 0926  . chlorhexidine (PERIDEX) 0.12 % solution 15 mL  15 mL Mouth Rinse BID Nita Sells, MD   15 mL at 02/11/15 0926  . Chlorhexidine Gluconate Cloth 2 % PADS 6 each  6 each Topical Q0600 Nita Sells, MD   6 each at 02/11/15 [phone]  . enoxaparin (LOVENOX) injection 40 mg  40 mg Subcutaneous Q24H Nita Sells, MD   40 mg at 02/10/15 1512  . FLUoxetine (PROZAC) capsule 60 mg  60 mg Oral Daily Nita Sells, MD   60 mg at 02/11/15 0926  . fluticasone (FLONASE) 50 MCG/ACT nasal spray 2 spray  2 spray Each Nare Daily Nita Sells, MD   2 spray at 02/11/15 0925  . ketotifen (ZADITOR) 0.025 % ophthalmic solution 1 drop  1 drop Both Eyes QHS Nita Sells, MD   1  drop at 02/10/15 2135  . levothyroxine (SYNTHROID, LEVOTHROID) tablet 112 mcg  112 mcg Oral QAC breakfast Nita Sells, MD   112 mcg at 02/11/15 0747  . loratadine (CLARITIN) tablet 10 mg  10 mg Oral Daily Nita Sells, MD   10 mg at 02/11/15 [id number]  . meclizine (ANTIVERT) tablet 25 mg  25 mg Oral 3 times per day Nita Sells, MD   25 mg at 02/11/15 0513  . methocarbamol (ROBAXIN) tablet 1,500 mg  1,500 mg Oral 3 times per day Nita Sells, MD   1,500 mg at 02/11/15 0513  . mupirocin ointment (BACTROBAN) 2 % 1 application  1 application Nasal BID Nita Sells, MD   1 application at [id number]  . oxyCODONE-acetaminophen (PERCOCET/ROXICET) 5-325 MG per tablet 2 tablet  2 tablet Oral Q4H PRN Nita Sells, MD   2 tablet at 02/11/15 0930  . pantoprazole (PROTONIX) EC tablet 20 mg  20 mg Oral BID Nita Sells, MD   20 mg at 02/11/15 [id number]  . polyethylene glycol (MIRALAX / GLYCOLAX) packet 17 g  17 g Oral BID Nita Sells, MD   17 g at 02/10/15 2135  . polyvinyl alcohol (LIQUIFILM TEARS) 1.4 % ophthalmic solution 1 drop  1 drop Both Eyes Q4H PRN Nita Sells, MD      . promethazine (PHENERGAN) tablet 12.5 mg  12.5 mg Oral Q6H PRN Nita Sells, MD      . sodium chloride 0.9 % injection 3 mL  3 mL Intravenous Q12H Nita Sells, MD   3 mL at 02/10/15 1230  . tobramycin (TOBREX) 0.3 % ophthalmic solution 1 drop  1 drop Right Eye QID PRN Nita Sells, MD      . traZODone (DESYREL) tablet 200 mg  200 mg Oral QHS Nita Sells, MD   200 mg at 02/10/15 2132  . trolamine salicylate (ASPERCREME) 10 % cream 1 application  1 application Topical [id number] PRN Nita Sells, MD         Discharge Medications: Please see discharge summary for a list of discharge medications.  Relevant Imaging Results:  Relevant Lab Results:   Additional Information SSN: [id number]. Pt to have Home Health PT/OT at ALF.  KIDD, SUZANNA A,  LCSW

## 2015-02-11 NOTE — Progress Notes (Signed)
Physical Therapy Treatment Patient Details Name: ALAENA CAUL MRN: [id number] DOB: [birth year] Today's Date: 02/11/2015    History of Present Illness Pt is a 80 y.o. female with history of hypertension and hypothyroidism, known h/o syncope/orthostasis, vertigo admitted from [facility] after syncopal episode.  Also with hx of increased falls over the past 3 years per pt.    PT Comments    Patient  C/o no overt dizziness this session, demonstrates decreased balance and is requiring mod assist for ambulation x 20'. Patient will require assistance for any mobility at DC, unable to [place] a RW.   Follow Up Recommendations  SNF;Supervision/Assistance - 24 hour     Equipment Recommendations  None recommended by PT    Recommendations for Other Services       Precautions / Restrictions Precautions Precautions: Fall Required Braces or Orthoses: Sling    Mobility  Bed Mobility Overal bed mobility: Needs Assistance Bed Mobility: Supine to Sit     Supine to sit: Min assist;HOB elevated     General bed mobility comments: extra time to scoot, difficult to push on Left hand with ring finger injury, assist for trunk to upright with min assist.  Transfers Overall transfer level: Needs assistance Equipment used: 2 person hand held assist Transfers: Sit to/from Stand Sit to Stand: Mod assist;+2 physical assistance;+2 safety/equipment Stand pivot transfers: +2 physical assistance;+2 safety/equipment       General transfer comment: assist to rise and steady, provided HHA for L UE, pt stood for orthostatics which were benign,  Ambulation/Gait Ambulation/Gait assistance: Mod assist;+2 physical assistance;+2 safety/equipment Ambulation Distance (Feet): 20 Feet (x 2) Assistive device: 2 person hand held assist Gait Pattern/deviations: Step-to pattern;Staggering right;Staggering left Gait velocity: slow   General Gait Details: Therapists supported both  arms, tends to  hold them in high guard position, cues to relax the arms. After 20' patient and keeping eyes closed, noted to have posterior lean, not dizzy per patient but c/o feeling weak. assisted to sitting on  window seat. Ambulated again to recliner after a rest. BP 14   Stairs            Wheelchair Mobility    Modified Rankin (Stroke Patients Only)       Balance Overall balance assessment: Needs assistance;History of Falls Sitting-balance support: Feet supported;Single extremity supported Sitting balance-Leahy Scale: Fair     Standing balance support: During functional activity;Bilateral upper extremity supported Standing balance-Leahy Scale: Poor                      Cognition Arousal/Alertness: Awake/alert Behavior During Therapy: WFL for tasks assessed/performed Overall Cognitive Status: Within Functional Limits for tasks assessed                      Exercises      General Comments        Pertinent Vitals/Pain Pain Assessment: Faces Faces Pain Scale: Hurts even more Pain Location: both knees and back Pain Descriptors / Indicators: Discomfort;Sore Pain Intervention(s): Limited activity within patient's tolerance;Monitored during session;Repositioned;Heat applied    Home Living                      Prior Function            PT Goals (current goals can now be found in the care plan section) Progress towards PT goals: Progressing toward goals    Frequency  Min 3X/week    PT Plan  Current plan remains appropriate    Co-evaluation PT/OT/SLP Co-Evaluation/Treatment: Yes Reason for Co-Treatment: For patient/therapist safety PT goals addressed during session: Mobility/safety with mobility       End of Session   Activity Tolerance: Patient limited by pain;Patient limited by fatigue Patient left: in chair;with call bell/phone within reach;with chair alarm set     Time: 1050-1118 PT Time Calculation (min) (ACUTE ONLY): 28  min  Charges:  $Gait Training: 8-22 mins                    G Codes:      Claretha Cooper 02/11/2015, 11:49 AM Tresa Endo PT [phone]

## 2015-02-11 NOTE — Progress Notes (Signed)
Erika Conley [id number] DOB: 09-15-1934 DOA: 02/10/2015 PCP: Erika Downing, MD  Brief narrative: 80 y/o ? known h/o syncope/Orthostasis Hypothyroid Acute encephalopathy in the past Htn MDD L4-L5 radiculopathy  She has known severe postural hypotension and had 2 falls within 24 hours, both with trauma to the head and also a broken Radial head on the R side  Past medical history-As per Problem list Chart reviewed as below-   Consultants:  Telephone consulted Dr. Erlinda Conley  Procedures:    Antibiotics:     Subjective   Fair some pain overnight Some headache and nausea as well but tolerated a diet yesterday   Objective    Interim History:   Telemetry:    Objective: Filed Vitals:   02/10/15 2136 02/11/15 0245 02/11/15 0500 02/11/15 0645  BP: 107/44 110/56  125/48  Pulse: 65 64  65  Temp: 98.2 F (36.8 C) 98.1 F (36.7 C)  97.9 F (36.6 C)  TempSrc: Oral Oral  Oral  Resp: 18 18  18   Height:      Weight:   62.097 kg (136 lb 14.4 oz)   SpO2: 100% 100%  100%    Intake/Output Summary (Last 24 hours) at 02/11/15 1314 Last data filed at 02/11/15 1014  Gross per 24 hour  Intake 2250.42 ml  Output   1700 ml  Net 550.42 ml    Exam:  General: eomi ncat Cardiovascular: s1 s2 no m/r/g Respiratory: clear no added sound Abdomen: soft nt nd no rebound Skin no le edema Neuro intact moves all 4 limbs equally  Data Reviewed: Basic Metabolic Panel:  Recent Labs Lab 02/10/15 0900 02/11/15 0420  NA 134* 134*  K 3.4* 3.2*  CL 91* 95*  CO2 33* 31  GLUCOSE 137* 132*  BUN 10 9  CREATININE 0.84 0.77  CALCIUM 9.2 8.9   Liver Function Tests:  Recent Labs Lab 02/11/15 0420  AST 22  ALT 12*  ALKPHOS 56  BILITOT 0.6  PROT 5.7*  ALBUMIN 3.6   No results for input(s): LIPASE, AMYLASE in the last 168 hours. No results for input(s): AMMONIA in the last 168 hours. CBC:  Recent Labs Lab 02/10/15 0900 02/11/15 0420  WBC 8.0 5.7  NEUTROABS  6.5  --   HGB 14.9 13.2  HCT 41.7 37.9  MCV 85.8 85.9  PLT 218 179   Cardiac Enzymes: No results for input(s): CKTOTAL, CKMB, CKMBINDEX, TROPONINI in the last 168 hours. BNP: Invalid input(s): POCBNP CBG:  Recent Labs Lab 02/10/15 0850  GLUCAP 127*    Recent Results (from the past 240 hour(s))  MRSA PCR Screening     Status: Abnormal   Collection Time: 02/10/15  2:19 PM  Result Value Ref Range Status   MRSA by PCR POSITIVE (A) NEGATIVE Final    Comment:        The GeneXpert MRSA Assay (FDA approved for NASAL specimens only), is one component of a comprehensive MRSA colonization surveillance program. It is not intended to diagnose MRSA infection nor to guide or monitor treatment for MRSA infections. RESULT CALLED TO, READ BACK BY AND VERIFIED WITH: Erika Conley @ 1721 ON [id number] BY POTEAT,S      Studies:              All Imaging reviewed and is as per above notation   Scheduled Meds: . antiseptic oral rinse  7 mL Mouth Rinse q12n4p  . calcium carbonate  400 mg of elemental calcium Oral BID  . calcium-vitamin D  1 tablet Oral Daily  . chlorhexidine  15 mL Mouth Rinse BID  . Chlorhexidine Gluconate Cloth  6 each Topical Q0600  . enoxaparin (LOVENOX) injection  40 mg Subcutaneous Q24H  . FLUoxetine  60 mg Oral Daily  . fluticasone  2 spray Each Nare Daily  . ketotifen  1 drop Both Eyes QHS  . levothyroxine  112 mcg Oral QAC breakfast  . loratadine  10 mg Oral Daily  . meclizine  25 mg Oral 3 times per day  . methocarbamol  1,500 mg Oral 3 times per day  . mupirocin ointment  1 application Nasal BID  . pantoprazole  20 mg Oral BID  . polyethylene glycol  17 g Oral BID  . sodium chloride  3 mL Intravenous Q12H  . traZODone  200 mg Oral QHS   Continuous Infusions: . sodium chloride 1,000 mL (02/11/15 0422)     Assessment/Plan:  1. Fall with Syncope-We will scale back IVF to 75 CC /hours.  Full BPV eval could not be completed inpatient due to R Elbow # and  will need re-assessment at SNF when d/c.  SHe was notably orthostatic so will add Midodrine 5 mg tid. 2. Radial head fracture R side-NO Weight bearing on this per Dr. Lowry Conley need OP follow up with him on d/c in 2 weeks 3. Htn-would hold HCTZ going forward and use only PRN Clonidine as this medication can casue volume depletion and risk for further vertigo 4. Hpothyroid-continue Synthroid 112 ug q am.  TSH as OP 5. Bowel irregularity-consider OP colonoscopy-hold imodium on d/c.  Miralx scheduled if needing a bowel regimen on d/c  Code Status: full code  Family Communication: none here at bedside Disposition Plan: obs-probable d/c to snf in am    Erika Griffes, MD  [facility] Pager [phone] 02/11/2015, 1:14 PM

## 2015-02-11 NOTE — Evaluation (Signed)
Occupational Therapy Evaluation Patient Details Name: Erika Conley MRN: [id number] DOB: 1934-08-19 Today's Date: 02/11/2015    History of Present Illness Pt is a 80 y.o. female with history of hypertension and hypothyroidism, known h/o syncope/orthostasis, vertigo admitted from [place] after syncopal episode.  Also with hx of increased falls over the past 3 years per pt.   Clinical Impression   Pt was admitted for the above.  At baseline, she is mod I with adls; however, she has had multiple falls, including a couple prior to this admission.  Pt has a h/o vertigo and she had syncopal episode.  Pt has limited use of RUE as it is splinted at 90 degree angle due to elbow fx.  She will benefit from continued OT in acute setting and post acute.  Goals are for min to max A for ADLs.    Follow Up Recommendations  SNF    Equipment Recommendations  3 in 1 bedside comode    Recommendations for Other Services       Precautions / Restrictions Precautions Precautions: Fall Required Braces or Orthoses: Other Brace/Splint (LUE splinted) Restrictions Weight Bearing Restrictions: Yes LUE Weight Bearing: Non weight bearing      Mobility Bed Mobility Overal bed mobility: Needs Assistance Bed Mobility: Supine to Sit     Supine to sit: Min assist;HOB elevated     General bed mobility comments: extra time to scoot, difficult to push on Left hand with ring finger injury, assist for trunk to upright with min assist.  Transfers Overall transfer level: Needs assistance Equipment used: 2 person hand held assist Transfers: Sit to/from Stand Sit to Stand: Mod assist;+2 physical assistance;+2 safety/equipment Stand pivot transfers: +2 physical assistance;+2 safety/equipment       General transfer comment: assist to rise and steady, provided HHA for L UE, pt stood for orthostatics which were benign,    Balance Overall balance assessment: Needs assistance;History of  Falls Sitting-balance support: Feet supported;Single extremity supported Sitting balance-Leahy Scale: Fair     Standing balance support: During functional activity;Bilateral upper extremity supported Standing balance-Leahy Scale: Poor                              ADL Overall ADL's : Needs assistance/impaired Eating/Feeding: Set up;Sitting (using LUE)   Grooming: Set up;Sitting (LUE)   Upper Body Bathing: Minimal assitance;Sitting   Lower Body Bathing: Maximal assistance;+2 for safety/equipment;Sit to/from stand   Upper Body Dressing : Moderate assistance;Sitting   Lower Body Dressing: Total assistance;+2 for safety/equipment;Sit to/from stand   Toilet Transfer: Minimal assistance;+2 for safety/equipment;BSC;Ambulation   Toileting- Clothing Manipulation and Hygiene: Total assistance;+2 for safety/equipment;Sit to/from stand         General ADL Comments: +2 for safety for ambulation.   Vestibular:   Pt c/o lightheadedness when walking across room--BP had not fallen. See vitals section of chart.  Last BP sitting was 145/70.   No c/o spinning during session but she states that she has been dealing with this for years.  When it occurs, she gets spinning and nausea.  Tested horizontal canals for BPPV, negative.  This has been a problem in the past.  Did not test posterior canals and she could not tolerate tx at this time due to LUE fx.  PT had attempted occulomotor eval yesterday, but pt does not have glasses and could not tolerate.  Pt states this is like what she usually has.  She has  been doing exercises which consists of lateral head tilts and nodding up and down.  This did not bother her today.  She does state that neck is sore.     Vision     Perception     Praxis      Pertinent Vitals/Pain Pain Assessment: Faces Faces Pain Scale: Hurts even more Pain Location: bil knees and back Pain Descriptors / Indicators: Discomfort Pain Intervention(s): Limited  activity within patient's tolerance;Monitored during session;Repositioned (heat to back)     Hand Dominance     Extremity/Trunk Assessment Upper Extremity Assessment RUE Deficits / Details: forearm in splint and sling in place LUE Deficits / Details: 4th digit swollen and pt cannot bend; DIP bruised           Communication Communication Communication: No difficulties   Cognition Arousal/Alertness: Awake/alert Behavior During Therapy: WFL for tasks assessed/performed Overall Cognitive Status: Within Functional Limits for tasks assessed                     General Comments       Exercises       Shoulder Instructions      Home Living Family/patient expects to be discharged to:: Unsure                                 Additional Comments: pt is from [place] home:  3 aides there, can call for assistance but it takes awhile      Prior Functioning/Environment Level of Independence: Independent with assistive device(s)        Comments: has mostly been mod I but multiple falls; h/o vertigo and had syncopal episodes    OT Diagnosis: Acute pain   OT Problem List: Decreased strength;Decreased activity tolerance;Impaired balance (sitting and/or standing);Decreased knowledge of use of DME or AE;Pain;Impaired UE functional use   OT Treatment/Interventions: Self-care/ADL training;DME and/or AE instruction;Patient/family education;Balance training;Therapeutic activities    OT Goals(Current goals can be found in the care plan section) Acute Rehab OT Goals Patient Stated Goal: be able to be independent again OT Goal Formulation: With patient Time For Goal Achievement: 02/25/15 Potential to Achieve Goals: Good ADL Goals Pt Will Perform Grooming: with min assist;standing Pt Will Perform Lower Body Bathing: with min assist;sit to/from stand;with adaptive equipment Pt Will Transfer to Toilet: with min assist;ambulating;bedside commode Pt Will  Perform Toileting - Clothing Manipulation and hygiene: with max assist;sit to/from stand  OT Frequency: Min 2X/week   Barriers to D/C:            Co-evaluation PT/OT/SLP Co-Evaluation/Treatment: Yes Reason for Co-Treatment: For patient/therapist safety PT goals addressed during session: Mobility/safety with mobility OT goals addressed during session: ADL's and self-care      End of Session    Activity Tolerance: Treatment limited secondary to medical complications (Comment) Patient left: in chair;with call bell/phone within reach;with chair alarm set   Time: [id number] OT Time Calculation (min): 31 min Charges:  OT General Charges $OT Visit: 1 Procedure OT Evaluation $Initial OT Evaluation Tier I: 1 Procedure G-Codes: OT G-codes **NOT FOR INPATIENT CLASS** Functional Assessment Tool Used: clinical observation Functional Limitation: Self care Self Care Current Status [id number]): At least 80 percent but less than 100 percent impaired, limited or restricted Self Care Goal Status [id number]): At least 60 percent but less than 80 percent impaired, limited or restricted  Citlalli Weikel 02/11/2015, 12:17 PM  Lesle Chris, OTR/L [phone] 02/11/2015

## 2015-02-11 NOTE — NC FL2 (Deleted)
[place] LEVEL OF CARE SCREENING TOOL     IDENTIFICATION  Patient Name: Erika Conley Birthdate: 01/03/1935 Sex: female Admission Date (Current Location): 02/10/2015  [name] and [place] Number:  Herbalist and Address:  [facility],  [place] [address] [place]      Provider Number: [id number]  Attending Physician Name and Address:  Nita Sells, MD  Relative Name and Phone Number:       Current Level of Care: Hospital Recommended Level of Care: [place] Prior Approval Number:    Date Approved/Denied:   PASRR Number:    Discharge Plan: Other (Comment) (ALF)    Current Diagnoses: Patient Active Problem List   Diagnosis Date Noted  . Syncopal episodes 02/10/2015  . Hypokalemia 11/10/2014  . Hypertension 11/10/2014  . Hypothyroidism 11/10/2014  . Syncope 11/10/2014  . Orthostatic hypotension 11/10/2014  . Orthostasis   . Constipated   . Dehydration   . Dizziness   . UTI (urinary tract infection) 06/04/2013  . Altered mental status 06/04/2013    Orientation RESPIRATION BLADDER Height & Weight    Self, Time, Situation, Place  Normal Continent 4\' 9"  (144.8 cm) 136 lbs.  BEHAVIORAL SYMPTOMS/MOOD NEUROLOGICAL BOWEL NUTRITION STATUS   (no behaviors)  (NONE) Continent Diet (regular)  AMBULATORY STATUS COMMUNICATION OF NEEDS Skin   Limited Assist Verbally Normal                       Personal Care Assistance Level of Assistance  Bathing, Feeding, Dressing Bathing Assistance: Limited assistance Feeding assistance: Independent (needs assist with set up) Dressing Assistance: Limited assistance     Functional Limitations Info  Sight, Hearing, Speech Sight Info: Adequate Hearing Info: Adequate Speech Info: Adequate    SPECIAL CARE FACTORS FREQUENCY  PT (By licensed PT), OT (By licensed OT)     PT Frequency: 3 x a week OT Frequency: 2 x a week            Contractures  Contractures Info: Not present    Additional Factors Info  Code Status, Allergies, Psychotropic, Isolation Precautions Code Status Info: FULL code status Allergies Info: Codeine, Cortisone, Other Psychotropic Info: Prozac   Isolation Precautions Info: 02/10/15 mrsa by pcr     Current Medications (02/11/2015):  This is the current hospital active medication list Current Facility-Administered Medications  Medication Dose Route Frequency Provider Last Rate Last Dose  . 0.9 %  sodium chloride infusion  1,000 mL Intravenous Continuous Dorie Rank, MD 125 mL/hr at 02/11/15 0422 1,000 mL at 02/11/15 0422  . antiseptic oral rinse (CPC / CETYLPYRIDINIUM CHLORIDE 0.05%) solution 7 mL  7 mL Mouth Rinse q12n4p Nita Sells, MD   7 mL at 02/10/15 1730  . benzonatate (TESSALON) capsule 100 mg  100 mg Oral TID PRN Nita Sells, MD   100 mg at 02/11/15 0930  . calcium carbonate (TUMS - dosed in mg elemental calcium) chewable tablet 400 mg of elemental calcium  400 mg of elemental calcium Oral BID Nita Sells, MD   400 mg of elemental calcium at 02/11/15 0926  . calcium-vitamin D (OSCAL WITH D) 500-200 MG-UNIT per tablet 1 tablet  1 tablet Oral Daily Nita Sells, MD   1 tablet at 02/11/15 0926  . chlorhexidine (PERIDEX) 0.12 % solution 15 mL  15 mL Mouth Rinse BID Nita Sells, MD   15 mL at 02/11/15 0926  . Chlorhexidine Gluconate Cloth 2 % PADS 6 each  6 each Topical Q0600 Nita Sells, MD   6 each at 02/11/15 [phone]  . enoxaparin (LOVENOX) injection 40 mg  40 mg Subcutaneous Q24H Nita Sells, MD   40 mg at 02/10/15 1512  . FLUoxetine (PROZAC) capsule 60 mg  60 mg Oral Daily Nita Sells, MD   60 mg at 02/11/15 0926  . fluticasone (FLONASE) 50 MCG/ACT nasal spray 2 spray  2 spray Each Nare Daily Nita Sells, MD   2 spray at 02/11/15 0925  . ketotifen (ZADITOR) 0.025 % ophthalmic solution 1 drop  1 drop Both Eyes QHS Nita Sells, MD   1  drop at 02/10/15 2135  . levothyroxine (SYNTHROID, LEVOTHROID) tablet 112 mcg  112 mcg Oral QAC breakfast Nita Sells, MD   112 mcg at 02/11/15 0747  . loratadine (CLARITIN) tablet 10 mg  10 mg Oral Daily Nita Sells, MD   10 mg at 02/11/15 [id number]  . meclizine (ANTIVERT) tablet 25 mg  25 mg Oral 3 times per day Nita Sells, MD   25 mg at 02/11/15 0513  . methocarbamol (ROBAXIN) tablet 1,500 mg  1,500 mg Oral 3 times per day Nita Sells, MD   1,500 mg at 02/11/15 0513  . mupirocin ointment (BACTROBAN) 2 % 1 application  1 application Nasal BID Nita Sells, MD   1 application at [id number]  . oxyCODONE-acetaminophen (PERCOCET/ROXICET) 5-325 MG per tablet 2 tablet  2 tablet Oral Q4H PRN Nita Sells, MD   2 tablet at 02/11/15 0930  . pantoprazole (PROTONIX) EC tablet 20 mg  20 mg Oral BID Nita Sells, MD   20 mg at 02/11/15 [id number]  . polyethylene glycol (MIRALAX / GLYCOLAX) packet 17 g  17 g Oral BID Nita Sells, MD   17 g at 02/10/15 2135  . polyvinyl alcohol (LIQUIFILM TEARS) 1.4 % ophthalmic solution 1 drop  1 drop Both Eyes Q4H PRN Nita Sells, MD      . promethazine (PHENERGAN) tablet 12.5 mg  12.5 mg Oral Q6H PRN Nita Sells, MD      . sodium chloride 0.9 % injection 3 mL  3 mL Intravenous Q12H Nita Sells, MD   3 mL at 02/10/15 1230  . tobramycin (TOBREX) 0.3 % ophthalmic solution 1 drop  1 drop Right Eye QID PRN Nita Sells, MD      . traZODone (DESYREL) tablet 200 mg  200 mg Oral QHS Nita Sells, MD   200 mg at 02/10/15 2132  . trolamine salicylate (ASPERCREME) 10 % cream 1 application  1 application Topical [id number] PRN Nita Sells, MD         Discharge Medications: Please see discharge summary for a list of discharge medications.  Relevant Imaging Results:  Relevant Lab Results:   Additional Information SSN: [id number]  Conley, Erika Better A, LCSW

## 2015-02-11 NOTE — Clinical Social Work Note (Signed)
Clinical Social Work Assessment  Patient Details  Name: Erika Conley MRN: [id number] Date of Birth: 23-Sep-1934  Date of referral:  02/11/15               Reason for consult:  Discharge Planning                Permission sought to share information with:  Family Supports Permission granted to share information::  Yes, Verbal Permission Granted  Name::     Erika Conley  Agency::     Relationship::  son  Contact Information:  [phone]  Housing/Transportation Living arrangements for the past 2 months:  [place] of Information:  Patient, Adult Children Patient Interpreter Needed:  None Criminal Activity/Legal Involvement Pertinent to Current Situation/Hospitalization:  No - Comment as needed Significant Relationships:  Adult Children Lives with:  Facility Resident Do you feel safe going back to the place where you live?  Yes Need for family participation in patient care:  Yes (Comment)  Care giving concerns:  Pt admitted from [facility] as observation status.    Social Worker assessment / plan:  CSW received referral that pt admitted from [facility] as observation. PT recommending SNF or increased assistance at ALF.   CSW met with pt at bedside. Pt confirmed that she is Conley resident at [facility]F. CSW discussed disposition planning pt stated that she wants CSW to speak with pt son, Erika Conley regarding plan.   CSW contacted pt son, Erika Conley via telephone. CSW introduced self and explained role. CSW discussed with pt son regarding recommendation from PT and MD re: SNF. CSW explained that given pt not having an inpatient admission that [name] will have to place pt under Medicaid at SNF and stay 30 days. Pt son does not feel that SNF is best plan for pt. Pt son discussed that pt has shown Conley cycle during this time each year with similar situation and feels that it is in pt best interest to return to [facility] with [facility] PT as long as [facility] is willing to accept pt back. Pt son discussed that [facility] has provided exceptional care to patient and knows pt well to be able to ensure her needs are met.   CSW contacted [facility] and discussed situation. CSW discussed with [facility] about pt current care needs and faxed clinicals to ALF. [facility] ALF feels that facility can continue to meet pt needs with the support of home health services. [facility] echoed what pt son discussed surrounding mental health issues and that pt will be best to return to familiar care. [facility] confirmed that facility can accept pt back when medically ready for discharge.   CSW updated pt, pt son and MD.   CSW to continue to follow to assist with pt return to [facility]F when medically ready for discharge.   Employment status:  Retired Forensic scientist:  Information systems manager, Medicaid [place] PT Recommendations:  [place] / Referral to community resources:  [place]  Patient/Family's Response to care:  Pt alert and oriented x 4. Pt deferring decision regarding discharge plans to pt son. Pt son does not feel that SNF is in the best interest of the pt and is confident that [facility] ALF can continue to meet pt needs.   Patient/Family's Understanding of and Emotional Response to Diagnosis, Current Treatment,  and Prognosis:  Pt son displayed understanding about pt medical conditions and displayed being very familiar with pt past medical conditions and hospital stays. Pt son does not wish for SNF and wants pt to return to [facility] ALF.   Emotional Assessment Appearance:  Appears stated age Attitude/Demeanor/Rapport:  Other (appropriate) Affect (typically observed):   Appropriate Orientation:  Oriented to Self, Oriented to Place, Oriented to  Time, Oriented to Situation Alcohol / Substance use:  Not Applicable Psych involvement (Current and /or in the community):  No (Comment)  Discharge Needs  Concerns to be addressed:  Discharge Planning Concerns Readmission within the last 30 days:  No Current discharge risk:  None Barriers to Discharge:  Continued Medical Work up   Erika Murray A, LCSW 02/11/2015, 3:06 PM [phone]

## 2015-02-12 DIAGNOSIS — G9001 Carotid sinus syncope: Secondary | ICD-10-CM | POA: Diagnosis not present

## 2015-02-12 DIAGNOSIS — I951 Orthostatic hypotension: Secondary | ICD-10-CM | POA: Diagnosis not present

## 2015-02-12 LAB — GLUCOSE, CAPILLARY
GLUCOSE-CAPILLARY: 102 mg/dL — AB (ref 65–99)
Glucose-Capillary: 105 mg/dL — ABNORMAL HIGH (ref 65–99)

## 2015-02-12 MED ORDER — OXYCODONE-ACETAMINOPHEN 5-325 MG PO TABS: 2.0000 | ORAL_TABLET | ORAL | Status: DC | PRN

## 2015-02-12 MED ORDER — MIDODRINE HCL 5 MG PO TABS
5.0000 mg | ORAL_TABLET | Freq: Three times a day (TID) | ORAL | Status: DC
Start: 2015-02-12 — End: 2015-04-09

## 2015-02-12 MED ORDER — POLYETHYLENE GLYCOL 3350 17 GM/SCOOP PO POWD: 1.0000 | Freq: Once | ORAL | Status: DC

## 2015-02-12 MED ORDER — MUPIROCIN CALCIUM 2 % NA OINT: 1.0000 "application " | TOPICAL_OINTMENT | Freq: Two times a day (BID) | NASAL | Status: DC

## 2015-02-12 MED ORDER — POLYETHYLENE GLYCOL 3350 17 G PO PACK: 17.0000 g | PACK | Freq: Every day | ORAL | Status: DC

## 2015-02-12 NOTE — Progress Notes (Signed)
Physical Therapy Treatment Patient Details Name: Erika Conley MRN: [id number] DOB: Jul 05, 1934 Today's Date: 02/12/2015    History of Present Illness Pt is a 80 y.o. female with history of hypertension and hypothyroidism, known h/o syncope/orthostasis, vertigo admitted from [facility] after syncopal episode.  Also with hx of increased falls over the past 3 years per pt.    PT Comments    Pt reports feeling a little better today and able to tolerate 80 feet of ambulation with HHA for L UE.  Pt reports mild dizziness with gait however did become worse or require rest break.  Follow Up Recommendations  SNF;Supervision/Assistance - 24 hour (family would like pt to go back to ALF per notes)     Equipment Recommendations  None recommended by PT    Recommendations for Other Services       Precautions / Restrictions Precautions Precautions: Fall Required Braces or Orthoses: Other Brace/Splint (R UE splinted) Restrictions Weight Bearing Restrictions: Yes LUE Weight Bearing: Non weight bearing    Mobility  Bed Mobility Overal bed mobility: Needs Assistance Bed Mobility: Supine to Sit     Supine to sit: Min assist;HOB elevated     General bed mobility comments: extra time to scoot, assist for trunk upright   Transfers Overall transfer level: Needs assistance Equipment used: 1 person hand held assist (armrest then HHA) Transfers: Sit to/from Stand Sit to Stand: Min assist         General transfer comment: assist to rise and steady, provided HHA for L UE, verbal cues for using L UE to assist with transfers  Ambulation/Gait Ambulation/Gait assistance: Min assist Ambulation Distance (Feet): 80 Feet Assistive device: 1 person hand held assist Gait Pattern/deviations: Step-through pattern;Decreased stride length Gait velocity: decreased, cautious   General Gait Details: reports mild dizziness with gait however did not become worse or need rest break,  provided HHA for L UE support (R in sling, NWB)   Stairs            Wheelchair Mobility    Modified Rankin (Stroke Patients Only)       Balance                                    Cognition Arousal/Alertness: Awake/alert Behavior During Therapy: WFL for tasks assessed/performed Overall Cognitive Status: Within Functional Limits for tasks assessed                      Exercises      General Comments        Pertinent Vitals/Pain Pain Assessment: 0-10 Pain Score: 6  Pain Location: R arm Pain Descriptors / Indicators: Sore;Discomfort Pain Intervention(s): Limited activity within patient's tolerance;Monitored during session;Repositioned    Home Living                      Prior Function            PT Goals (current goals can now be found in the care plan section) Progress towards PT goals: Progressing toward goals    Frequency  Min 3X/week    PT Plan Current plan remains appropriate    Co-evaluation             End of Session Equipment Utilized During Treatment: Other (comment);Gait belt (sling) Activity Tolerance: Patient tolerated treatment well Patient left: in chair;with call bell/phone within reach;with chair alarm set  Time: [id number] PT Time Calculation (min) (ACUTE ONLY): 12 min  Charges:  $Gait Training: 8-22 mins                    G Codes:      Chelsae Zanella,KATHrine E 18-Feb-2015, 1:13 PM Carmelia Bake, PT, DPT 02/18/15 Pager: [phone]

## 2015-02-12 NOTE — Progress Notes (Signed)
Pt for discharge back to [facility]F with home health services.  CSW facilitated pt discharge needs including contacting facility, faxing pt discharge information, confirming facility received and reviewed discharge information and confirmed that transport can be arranged for pt return, providing RN phone number to call report, discussing with pt at bedside and pt son, [name] via telephone, and arranging ambulance transport for pt return to [facility]F.   No further social work needs identified at this time.  CSW signing off.   Alison Murray, MSW, [place] Work [phone]

## 2015-02-12 NOTE — Progress Notes (Addendum)
Occupational Therapy Treatment Patient Details Name: Erika Conley MRN: [id number] DOB: [birth year] Today's Date: 02/12/2015    History of present illness Pt is a 80 y.o. female with history of hypertension and hypothyroidism, known h/o syncope/orthostasis, vertigo admitted from [place] after syncopal episode.  Also with hx of increased falls over the past 3 years per pt.   OT comments  Pt ambulated to bathroom with min A; tolerated well.  Plans to return to ALF; will need assistance with all mobility and ADLs  Follow Up Recommendations  SNF;family prefers back to ALF, if so, home health OT;Supervision - Intermittent (assistance for all mobility and ADLS)    Equipment Recommendations  3 in 1 bedside comode    Recommendations for Other Services      Precautions / Restrictions Precautions Precautions: Fall Required Braces or Orthoses: Other Brace/Splint (sling used for mobility) Restrictions Weight Bearing Restrictions: Yes LUE Weight Bearing: Non weight bearing       Mobility Bed Mobility Overal bed mobility: Needs Assistance Bed Mobility: Supine to Sit     Supine to sit: Min assist;HOB elevated Sit to supine: Min assist   General bed mobility comments: assist to scoot to EOB; min A for trunk getting up and min A for legs lying back down  Transfers Overall transfer level: Needs assistance Equipment used: 1 person hand held assist Transfers: Sit to/from Stand Sit to Stand: Min assist         General transfer comment: assist to rise and steady, provided HHA for L UE, verbal cues for using L UE to assist with transfers    Balance             Standing balance-Leahy Scale: Poor                     ADL       Grooming: Wash/dry hands;Minimal assistance;Standing                   Toilet Transfer: Minimal assistance;Ambulation;Comfort height toilet;Grab bars  Toilet Hygiene:  Mod A, sit/lean then standing                     Vision                     Perception     Praxis      Cognition   Behavior During Therapy: WFL for tasks assessed/performed Overall Cognitive Status: Within Functional Limits for tasks assessed                       Extremity/Trunk Assessment               Exercises  Educated on positioning for RUE edema--pt was on pillow but was not up high enough for ramp effect.  Pt has been pumping fingers    Shoulder Instructions       General Comments      Pertinent Vitals/ Pain       Pain Assessment: 0-10 Pain Score: 5  Pain Location: right elbow Pain Descriptors / Indicators: Sore Pain Intervention(s): Limited activity within patient's tolerance;Monitored during session;Repositioned;RN gave pain meds during session (heat for spine)  Home Living                                          Prior  Functioning/Environment              Frequency Min 2X/week     Progress Toward Goals  OT Goals(current goals can now be found in the care plan section)  Progress towards OT goals: Progressing toward goals     Plan      Co-evaluation                 End of Session     Activity Tolerance Patient tolerated treatment well   Patient Left in bed;with call bell/phone within reach;with bed alarm set;with nursing/sitter in room   Nurse Communication          Time: [id number] OT Time Calculation (min): 21 min  Charges: OT General Charges $OT Visit: 1 Procedure OT Treatments $Self Care/Home Management : 8-22 mins  Renaldo Gornick 02/12/2015, 2:18 PM  Lesle Chris, OTR/L [phone] 02/12/2015

## 2015-02-12 NOTE — NC FL2 (Signed)
[place] LEVEL OF CARE SCREENING TOOL     IDENTIFICATION  Patient Name: Erika Conley Birthdate: 04/22/1934 Sex: female Admission Date (Current Location): 02/10/2015  [facility] and [place] Number:  Herbalist and Address:  [facility],  [place] [address] [place]      Provider Number: [id number]  Attending Physician Name and Address:  Nita Sells, MD  Relative Name and Phone Number:       Current Level of Care: Hospital Recommended Level of Care: [place] Prior Approval Number:    Date Approved/Denied:   PASRR Number:    Discharge Plan: Other (Comment) (ALF)    Current Diagnoses: Patient Active Problem List   Diagnosis Date Noted  . Syncopal episodes 02/10/2015  . Hypokalemia 11/10/2014  . Hypertension 11/10/2014  . Hypothyroidism 11/10/2014  . Syncope 11/10/2014  . Orthostatic hypotension 11/10/2014  . Orthostasis   . Constipated   . Dehydration   . Dizziness   . UTI (urinary tract infection) 06/04/2013  . Altered mental status 06/04/2013    Orientation RESPIRATION BLADDER Height & Weight    Self, Time, Situation, Place  Normal Continent 4\' 9"  (144.8 cm) 136 lbs.  BEHAVIORAL SYMPTOMS/MOOD NEUROLOGICAL BOWEL NUTRITION STATUS   (no behaviors)  (NONE) Continent Diet (No Added Salt)  AMBULATORY STATUS COMMUNICATION OF NEEDS Skin   Extensive Assist (Increased assist due to R Radial Head fracture) Verbally Normal                       Personal Care Assistance Level of Assistance  Bathing, Feeding, Dressing Bathing Assistance: Limited assistance Feeding assistance: Independent (needs assist with set up) Dressing Assistance: Limited assistance     Functional Limitations Info  Sight, Hearing, Speech Sight Info: Adequate Hearing Info: Adequate Speech Info: Adequate    SPECIAL CARE FACTORS FREQUENCY  PT (By licensed PT), OT (By licensed OT)     PT Frequency: 3 x a week OT  Frequency: 2 x a week            Contractures Contractures Info: Not present    Additional Factors Info  Code Status, Allergies, Psychotropic, Isolation Precautions Code Status Info: FULL code status Allergies Info: Codeine, Cortisone, Other Psychotropic Info: Prozac   Isolation Precautions Info: 02/10/15 mrsa by pcr     Current Medications (02/12/2015):  This is the current hospital active medication list Current Facility-Administered Medications  Medication Dose Route Frequency Provider Last Rate Last Dose  . 0.9 %  sodium chloride infusion  1,000 mL Intravenous Continuous Nita Sells, MD 75 mL/hr at 02/12/15 0443 1,000 mL at 02/12/15 0443  . antiseptic oral rinse (CPC / CETYLPYRIDINIUM CHLORIDE 0.05%) solution 7 mL  7 mL Mouth Rinse q12n4p Nita Sells, MD   7 mL at 02/11/15 1607  . benzonatate (TESSALON) capsule 100 mg  100 mg Oral TID PRN Nita Sells, MD   100 mg at 02/12/15 [id number]  . calcium carbonate (TUMS - dosed in mg elemental calcium) chewable tablet 400 mg of elemental calcium  400 mg of elemental calcium Oral BID Nita Sells, MD   400 mg of elemental calcium at 02/12/15 0923  . calcium-vitamin D (OSCAL WITH D) 500-200 MG-UNIT per tablet 1 tablet  1 tablet Oral Daily Nita Sells, MD   1 tablet at 02/12/15 0924  . chlorhexidine (PERIDEX) 0.12 % solution 15 mL  15 mL Mouth Rinse BID Nita Sells, MD   15 mL at 02/12/15 0924  .  Chlorhexidine Gluconate Cloth 2 % PADS 6 each  6 each Topical Q0600 Nita Sells, MD   6 each at 02/12/15 [phone]  . enoxaparin (LOVENOX) injection 40 mg  40 mg Subcutaneous Q24H Nita Sells, MD   40 mg at 02/11/15 1607  . FLUoxetine (PROZAC) capsule 60 mg  60 mg Oral Daily Nita Sells, MD   60 mg at 02/12/15 0923  . fluticasone (FLONASE) 50 MCG/ACT nasal spray 2 spray  2 spray Each Nare Daily Nita Sells, MD   2 spray at 02/12/15 0924  . ketotifen (ZADITOR) 0.025 % ophthalmic  solution 1 drop  1 drop Both Eyes QHS Nita Sells, MD   1 drop at 02/11/15 2157  . levothyroxine (SYNTHROID, LEVOTHROID) tablet 112 mcg  112 mcg Oral QAC breakfast Nita Sells, MD   112 mcg at 02/12/15 0801  . loratadine (CLARITIN) tablet 10 mg  10 mg Oral Daily Nita Sells, MD   10 mg at 02/12/15 [id number]  . meclizine (ANTIVERT) tablet 25 mg  25 mg Oral 3 times per day Nita Sells, MD   25 mg at 02/12/15 0616  . methocarbamol (ROBAXIN) tablet 1,500 mg  1,500 mg Oral 3 times per day Nita Sells, MD   1,500 mg at 02/12/15 0616  . midodrine (PROAMATINE) tablet 5 mg  5 mg Oral TID WC Nita Sells, MD   5 mg at 02/12/15 0801  . mupirocin ointment (BACTROBAN) 2 % 1 application  1 application Nasal BID Nita Sells, MD   1 application at [id number] [phone]  . oxyCODONE-acetaminophen (PERCOCET/ROXICET) 5-325 MG per tablet 2 tablet  2 tablet Oral Q4H PRN Nita Sells, MD   2 tablet at 02/12/15 0836  . pantoprazole (PROTONIX) EC tablet 20 mg  20 mg Oral BID Nita Sells, MD   20 mg at 02/12/15 0923  . polyethylene glycol (MIRALAX / GLYCOLAX) packet 17 g  17 g Oral BID Nita Sells, MD   17 g at 02/11/15 2155  . polyvinyl alcohol (LIQUIFILM TEARS) 1.4 % ophthalmic solution 1 drop  1 drop Both Eyes Q4H PRN Nita Sells, MD      . promethazine (PHENERGAN) tablet 12.5 mg  12.5 mg Oral Q6H PRN Nita Sells, MD      . sodium chloride 0.9 % injection 3 mL  3 mL Intravenous Q12H Nita Sells, MD   3 mL at 02/10/15 1230  . tobramycin (TOBREX) 0.3 % ophthalmic solution 1 drop  1 drop Right Eye QID PRN Nita Sells, MD      . traZODone (DESYREL) tablet 200 mg  200 mg Oral QHS Nita Sells, MD   200 mg at 02/11/15 2155  . trolamine salicylate (ASPERCREME) 10 % cream 1 application  1 application Topical [id number] PRN Nita Sells, MD         Discharge Medications: Please see discharge summary for a list of discharge  medications.  Relevant Imaging Results:  Relevant Lab Results:   Additional Information SSN: [id number]. Pt to have Home Health PT/OT at ALF.  Nakeem Murnane A, LCSW

## 2015-02-12 NOTE — Care Management Note (Signed)
Case Management Note  Patient Details  Name: Erika Conley MRN: [id number] Date of Birth: August 03, 1934  Subjective/Objective:                    Action/Plan:  Pt plan to d/c back to ALF with [facility]. Expected Discharge Date:   (UNKNOWN)               Expected Discharge Plan:  Skilled Nursing Facility  In-House Referral:  Clinical Social Work  Discharge planning Services  CM Consult  Post Acute Care Choice:  Home Health Choice offered to:  NA  DME Arranged:    DME Agency:     HH Arranged:  PT, OT HH Agency:  [facility]  Status of Service:  In process, will continue to follow  Medicare Important Message Given:    Date Medicare IM Given:    Medicare IM give by:    Date Additional Medicare IM Given:    Additional Medicare Important Message give by:     If discussed at [place] of Stay Meetings, dates discussed:    Additional CommentsPurcell Mouton, RN 02/12/2015, 2:20 PM

## 2015-02-12 NOTE — Discharge Summary (Addendum)
Physician Discharge Summary  Erika Conley [id number] DOB: [birth year] DOA: 02/10/2015  PCP: Leonard Downing, MD  Admit date: 02/10/2015 Discharge date: 02/12/2015  Time spent: 35 minutes  Recommendations for Outpatient Follow-up:  1. Added Midodrine for orthostatic hypotension 2. DO NOT RESUME HCTZ-instead consider Cloniinde 0.1 mg blood pressure PRN above 150/85  3. ALF MD to coordinate for close follow up with orthopedics Regarding R Radial Head fracture-Will need Follow up with Dr. Eduard Roux of Orthopedics 2 weeks from discharge for Office Evaluation 4. Patient would benefit from Max assist at ALF 5. Consider routine labs as per Primary MD in 2-3 weeks   Discharge Diagnoses:  Active Problems:   Syncope   Syncopal episodes   Discharge Condition: fair  Diet recommendation: hh low salt  Filed Weights   02/11/15 0500 02/12/15 0507  Weight: 62.097 kg (136 lb 14.4 oz) 64.139 kg (141 lb 6.4 oz)    History of present illness:  80 y/o ? known h/o syncope/Orthostasis Hypothyroid Acute encephalopathy in the past Htn MDD L4-L5 radiculopathy  She has known severe postural hypotension and had 2 falls within 24 hours, both with trauma to the head and also a broken Radial head on the R side  Hospital Course:    Fall with Syncope-We will scale back IVF to 75 CC /hours. Full BPV eval could not be completed inpatient due to R Elbow # and will need re-assessment at SNF when d/c. She was notably orthostatic so added Midodrine 5 mg tid this admission which can be uptitrated as an OP  Facial laceration-Sutures removed 12/23 am.  Monitor wound for healing  Radial head fracture R side-NO Weight bearing on this per Dr. Lowry Bowl need OP follow up with him on d/c in 2 weeks  Htn-would hold HCTZ going forward and use only PRN Clonidine as this medication can casue volume depletion and risk for further vertigo  Hpothyroid-continue Synthroid 112 ug q am. TSH as OP  Bowel  irregularity-consider OP colonoscopy-Continue Midodrine as an OP.  Would not use Immodium on-going  Procedures:  Consultants:  Telephone consulted Dr. Erlinda Hong   Discharge Exam: Filed Vitals:   02/11/15 2125 02/12/15 0507  BP: 145/41 119/44  Pulse: 65 66  Temp: 97.9 F (36.6 C) 99.3 F (37.4 C)  Resp: 18 18    General: eomi ncat-woulnd is clean on face.  Hematoma is painful on the L side Cardiovascular:  s1 s 2no m/r/g Respiratory: clear no adde dsound ROM to knees and elbows is painful but intact.  No effusions  Discharge Instructions   Discharge Instructions    Diet - low sodium heart healthy    Complete by:  As directed      Increase activity slowly    Complete by:  As directed           Current Discharge Medication List    START taking these medications   Details  midodrine (PROAMATINE) 5 MG tablet Take 1 tablet (5 mg total) by mouth 3 (three) times daily with meals. Qty: 30 tablet, Refills: 0    oxyCODONE-acetaminophen (PERCOCET/ROXICET) 5-325 MG tablet Take 2 tablets by mouth every 4 (four) hours as needed for moderate pain or severe pain. Qty: 30 tablet, Refills: 0    !! polyethylene glycol (MIRALAX) packet Take 17 g by mouth daily. Qty: 14 each, Refills: 0     !! - Potential duplicate medications found. Please discuss with provider.    CONTINUE these medications which have CHANGED   Details  mupirocin nasal ointment (BACTROBAN NASAL) 2 % Place 1 application into the nose 2 (two) times daily. For five (5) days. Press sides of nose together and gently massage. Qty: 10 g, Refills: 0      CONTINUE these medications which have NOT CHANGED   Details  albuterol (PROVENTIL HFA;VENTOLIN HFA) 108 (90 BASE) MCG/ACT inhaler Inhale 2 puffs into the lungs every 4 (four) hours as needed for wheezing or shortness of breath (coough). Qty: 3.7 g, Refills: 0    Alcaftadine (LASTACAFT) 0.25 % SOLN Place 1 drop into both eyes at bedtime.    antiseptic oral rinse (BIOTENE)  LIQD 15 mLs by Mouth Rinse route 3 (three) times daily.    benzocaine-resorcinol (VAGISIL) 5-2 % vaginal cream Place 1 application vaginally as needed for itching.    benzonatate (TESSALON) 100 MG capsule Take 1 capsule (100 mg total) by mouth 3 (three) times daily as needed for cough. Qty: 21 capsule, Refills: 0    bismuth subsalicylate (PEPTO BISMOL) 262 MG/15ML suspension Take 30 mLs by mouth every 4 (four) hours as needed for diarrhea or loose stools.    calcium-vitamin D (OSCAL WITH D) 500-200 MG-UNIT tablet Take 1 tablet by mouth daily.    cetirizine (ZYRTEC) 10 MG tablet Take 10 mg by mouth every evening.    chlorhexidine (PERIDEX) 0.12 % solution Swish and spit 15 mLs 2 (two) times daily.    FLUoxetine (PROZAC) 20 MG capsule Take 60 mg by mouth daily.    fluticasone (FLONASE) 50 MCG/ACT nasal spray Place 2 sprays into both nostrils daily.    hydroxypropyl methylcellulose / hypromellose (ISOPTO TEARS / GONIOVISC) 2.5 % ophthalmic solution 1 drop 4 (four) times daily as needed for dry eyes.    Infant Care Products (JOHNSONS BABY SHAMPOO EX) Apply topically. Dilute a pea size amount with water; close right eye, wash and rinse. Repeat twice daily for 2 days prior to injection.    levothyroxine (SYNTHROID, LEVOTHROID) 112 MCG tablet Take 112 mcg by mouth daily before breakfast.    loperamide (IMODIUM) 2 MG capsule Take 2 mg by mouth. After each loose stool    meclizine (ANTIVERT) 25 MG tablet Take 1 tablet (25 mg total) by mouth every 8 (eight) hours. Qty: 30 tablet, Refills: 0    methocarbamol (ROBAXIN) 750 MG tablet Take 1,500 mg by mouth every 8 (eight) hours.    Multiple Vitamins-Minerals (PRESERVISION AREDS) CAPS Take 1 capsule by mouth 2 (two) times daily.    Nutritional Supplements (NUTRITIONAL DRINK MIX PO) Take 1 Dose by mouth daily. Mighty shake    ondansetron (ZOFRAN) 8 MG tablet Take 8 mg by mouth every 8 (eight) hours as needed for nausea or vomiting.     pantoprazole (PROTONIX) 20 MG tablet Take 20 mg by mouth 2 (two) times daily.    tobramycin (TOBREX) 0.3 % ophthalmic solution Place 1 drop into the right eye 4 (four) times daily as needed (the day prior, the day of, and the day after treatment.).     traZODone (DESYREL) 100 MG tablet Take 200 mg by mouth at bedtime.     triamcinolone cream (KENALOG) 0.1 % Apply 1 application topically every 6 (six) hours as needed (itching).    triamterene-hydrochlorothiazide (MAXZIDE) 75-50 MG tablet Take 1 tablet by mouth daily.    trolamine salicylate (ASPERCREME) 10 % cream Apply 1 application topically every 8 (eight) hours as needed for muscle pain.     !! polyethylene glycol (MIRALAX / GLYCOLAX) packet Take 17 g by  mouth 2 (two) times daily. Qty: 60 each, Refills: 0     !! - Potential duplicate medications found. Please discuss with provider.    STOP taking these medications     docusate sodium (COLACE) 100 MG capsule        Allergies  Allergen Reactions  . Codeine     Pass out, fall  . Cortisone     Pass out, fall   . Other     All narcotics cause patient to Pass out, fall    Follow-up Information    Follow up with Marianna Payment, MD. Go in 2 weeks.   Specialty:  Orthopedic Surgery   Contact information:   [place] [id number] [phone]        The results of significant diagnostics from this hospitalization (including imaging, microbiology, ancillary and laboratory) are listed below for reference.    Significant Diagnostic Studies: Dg Chest 2 View  02/10/2015  CLINICAL DATA:  Recent fall with elbow pain and right shoulder pain, initial encounter EXAM: CHEST - 2 VIEW COMPARISON:  01/29/2015 FINDINGS: Cardiac shadow is within normal limits. Elevation the right hemidiaphragm is again seen. No focal infiltrate or sizable effusion is seen. Chronic changes in the proximal left humerus are noted. Chronic and treated vertebral compression  deformities are again seen. IMPRESSION: No acute abnormality noted. Electronically Signed   By: Inez Catalina M.D.   On: 02/10/2015 09:42   Dg Chest 2 View  01/29/2015  CLINICAL DATA:  Patient complains of cough EXAM: CHEST  2 VIEW COMPARISON:  Thoracic spine 07/23/2013. FINDINGS: Normal mediastinum and cardiac silhouette. Low lung volumes. No effusion, infiltrate or pneumothorax. There is chronic compression deformities of the lower thoracic spine with focal kyphosis. Augmentation at T11 and T12 with vertebroplasty cement. IMPRESSION: 1. No acute cardiopulmonary findings. 2. Chronic compression fractures in the lower thoracic spine. Electronically Signed   By: Suzy Bouchard M.D.   On: 01/29/2015 07:53   Dg Shoulder Right  02/09/2015  CLINICAL DATA:  Find status post trip and fall today in the bathroom with a right shoulder injury and pain. Initial encounter. EXAM: RIGHT SHOULDER - 2+ VIEW COMPARISON:  None. FINDINGS: No acute bony or joint abnormality is identified. Small calcifications projecting over the humeral head are likely due to calcific rotator cuff tendinopathy. The humeral head is somewhat high-riding. Mild acromioclavicular degenerative change is seen. Image right lung and ribs are unremarkable. IMPRESSION: No acute abnormality. High-riding humeral head compatible with chronic rotator cuff tear. Findings suggestive of calcific rotator cuff tendinopathy also identified. Electronically Signed   By: Inge Rise M.D.   On: 02/09/2015 12:13   Dg Elbow Complete Right  02/10/2015  CLINICAL DATA:  Recent fall with right elbow pain EXAM: RIGHT ELBOW - COMPLETE 3+ VIEW COMPARISON:  None. FINDINGS: There is a mildly depressed radial head fracture with mild elevation the anterior fat pad. No other focal abnormality is seen. IMPRESSION: Mildly depressed radial head fracture Electronically Signed   By: Inez Catalina M.D.   On: 02/10/2015 09:44   Dg Wrist Complete Right  02/09/2015  CLINICAL DATA:   Right wrist pain, tripped over a towel in the shower today EXAM: RIGHT WRIST - COMPLETE 3+ VIEW COMPARISON:  02/16/2013 FINDINGS: Four views of the right wrist submitted. No acute fracture or subluxation. There are degenerative changes with narrowing of radiocarpal joint space. Metallic fixation plate distal radius is unchanged in position. Degenerative changes first carpometacarpal joint. IMPRESSION: No acute  fracture or subluxation. Stable postsurgical changes distal radius. Degenerative changes as described above. Electronically Signed   By: Lahoma Crocker M.D.   On: 02/09/2015 12:14   Ct Head Wo Contrast  02/10/2015  CLINICAL DATA:  Syncope with fall EXAM: CT HEAD WITHOUT CONTRAST TECHNIQUE: Contiguous axial images were obtained from the base of the skull through the vertex without intravenous contrast. COMPARISON:  February 09, 2015 FINDINGS: Slight generalized atrophy is stable. There is no intracranial mass, hemorrhage, extra-axial fluid collection, or midline shift. There is mild small vessel disease in the centra semiovale bilaterally. Elsewhere gray-white compartments appear normal. No acute infarct evident. The bony calvarium appears intact. The mastoid air cells are clear. No intraorbital lesions are identified. IMPRESSION: Mild atrophy with slight periventricular small vessel disease. No intracranial mass, hemorrhage, or extra-axial fluid collection. No acute infarct evident. Electronically Signed   By: Lowella Grip III M.D.   On: 02/10/2015 09:26   Ct Head Wo Contrast  02/09/2015  CLINICAL DATA:  Patient status post fall from standing. Right facial laceration. No reported loss of consciousness. EXAM: CT HEAD WITHOUT CONTRAST CT CERVICAL SPINE WITHOUT CONTRAST TECHNIQUE: Multidetector CT imaging of the head and cervical spine was performed following the standard protocol without intravenous contrast. Multiplanar CT image reconstructions of the cervical spine were also generated. COMPARISON:   MRI brain 11/21/2014.  CT head 11/10/2014. FINDINGS: CT HEAD FINDINGS Ventricles and sulci are appropriate for patient's age. No evidence for acute cortically based infarct, intracranial hemorrhage, mass lesion or mass-effect. Orbits are unremarkable. Small amount of fluid within the right maxillary sinus. Remainder of the paranasal sinuses are unremarkable. Mastoid air cells are well aerated. The calvarium is intact. CT CERVICAL SPINE FINDINGS Straightening of the normal cervical lordosis. Degenerative disc disease most pronounced C5-6. Multilevel facet degenerative changes bilaterally. Preservation the vertebral body heights. Craniocervical junction is intact. No evidence for acute cervical spine fracture. There is a 2.0 x 1.4 cm soft tissue mass within the right peritracheal location (image 60; series 5). IMPRESSION: No acute intracranial process. No acute cervical spine fracture. 2 cm right peritracheal mass near the expected location of the thyroid. Recommend correlation with neck ultrasound. Electronically Signed   By: Lovey Newcomer M.D.   On: 02/09/2015 12:49   Ct Cervical Spine Wo Contrast  02/09/2015  CLINICAL DATA:  Patient status post fall from standing. Right facial laceration. No reported loss of consciousness. EXAM: CT HEAD WITHOUT CONTRAST CT CERVICAL SPINE WITHOUT CONTRAST TECHNIQUE: Multidetector CT imaging of the head and cervical spine was performed following the standard protocol without intravenous contrast. Multiplanar CT image reconstructions of the cervical spine were also generated. COMPARISON:  MRI brain 11/21/2014.  CT head 11/10/2014. FINDINGS: CT HEAD FINDINGS Ventricles and sulci are appropriate for patient's age. No evidence for acute cortically based infarct, intracranial hemorrhage, mass lesion or mass-effect. Orbits are unremarkable. Small amount of fluid within the right maxillary sinus. Remainder of the paranasal sinuses are unremarkable. Mastoid air cells are well aerated. The  calvarium is intact. CT CERVICAL SPINE FINDINGS Straightening of the normal cervical lordosis. Degenerative disc disease most pronounced C5-6. Multilevel facet degenerative changes bilaterally. Preservation the vertebral body heights. Craniocervical junction is intact. No evidence for acute cervical spine fracture. There is a 2.0 x 1.4 cm soft tissue mass within the right peritracheal location (image 60; series 5). IMPRESSION: No acute intracranial process. No acute cervical spine fracture. 2 cm right peritracheal mass near the expected location of the thyroid. Recommend correlation with neck  ultrasound. Electronically Signed   By: Lovey Newcomer M.D.   On: 02/09/2015 12:49   Dg Hip Unilat With Pelvis 2-3 Views Right  02/09/2015  CLINICAL DATA:  Post fall on tall getting out of shower today, now with right hip pain. EXAM: DG HIP (WITH OR WITHOUT PELVIS) 2-3V RIGHT COMPARISON:  None. FINDINGS: Osteopenia without displaced fracture.  No dislocation. Mild degenerative change of the right hip with joint space loss, subchondral sclerosis and osteophytosis. No evidence avascular necrosis. Limited visualization of the adjacent pelvis is normal. Suspected L5 laminectomy, incompletely evaluated. At least moderate degenerative change of the contralateral hip is suspected though incompletely evaluated. Several phleboliths overlie the right hemipelvis. Regional soft tissues appear otherwise normal. IMPRESSION: No fracture or dislocation. Electronically Signed   By: Sandi Mariscal M.D.   On: 02/09/2015 12:12    Microbiology: Recent Results (from the past 240 hour(s))  MRSA PCR Screening     Status: Abnormal   Collection Time: 02/10/15  2:19 PM  Result Value Ref Range Status   MRSA by PCR POSITIVE (A) NEGATIVE Final    Comment:        The GeneXpert MRSA Assay (FDA approved for NASAL specimens only), is one component of a comprehensive MRSA colonization surveillance program. It is not intended to diagnose  MRSA infection nor to guide or monitor treatment for MRSA infections. RESULT CALLED TO, READ BACK BY AND VERIFIED WITH: GAGLIANO,S @ 1721 ON [id number] BY POTEAT,S      Labs: Basic Metabolic Panel:  Recent Labs Lab 02/10/15 0900 02/11/15 0420  NA 134* 134*  K 3.4* 3.2*  CL 91* 95*  CO2 33* 31  GLUCOSE 137* 132*  BUN 10 9  CREATININE 0.84 0.77  CALCIUM 9.2 8.9   Liver Function Tests:  Recent Labs Lab 02/11/15 0420  AST 22  ALT 12*  ALKPHOS 56  BILITOT 0.6  PROT 5.7*  ALBUMIN 3.6   No results for input(s): LIPASE, AMYLASE in the last 168 hours. No results for input(s): AMMONIA in the last 168 hours. CBC:  Recent Labs Lab 02/10/15 0900 02/11/15 0420  WBC 8.0 5.7  NEUTROABS 6.5  --   HGB 14.9 13.2  HCT 41.7 37.9  MCV 85.8 85.9  PLT 218 179   Cardiac Enzymes: No results for input(s): CKTOTAL, CKMB, CKMBINDEX, TROPONINI in the last 168 hours. BNP: BNP (last 3 results) No results for input(s): BNP in the last 8760 hours.  ProBNP (last 3 results) No results for input(s): PROBNP in the last 8760 hours.  CBG:  Recent Labs Lab 02/10/15 0850 02/12/15 0616 02/12/15 0744  GLUCAP 127* 105* 102*       Signed:  Nita Sells MD  FACP  [facility] 02/12/2015, 10:02 AM

## 2015-04-09 ENCOUNTER — Observation Stay (HOSPITAL_BASED_OUTPATIENT_CLINIC_OR_DEPARTMENT_OTHER): Payer: Medicare Other

## 2015-04-09 ENCOUNTER — Encounter (HOSPITAL_COMMUNITY): Payer: Self-pay | Admitting: Emergency Medicine

## 2015-04-09 ENCOUNTER — Emergency Department (HOSPITAL_COMMUNITY): Payer: Medicare Other

## 2015-04-09 ENCOUNTER — Inpatient Hospital Stay (HOSPITAL_COMMUNITY)
Admission: EM | Admit: 2015-04-09 | Discharge: 2015-04-12 | DRG: 640 | Disposition: A | Discharge status: Nursing Home (Includes ICF) | Payer: Medicare Other | Source: Skilled Nursing Facility | Attending: Internal Medicine | Admitting: Internal Medicine

## 2015-04-09 DIAGNOSIS — E871 Hypo-osmolality and hyponatremia: Secondary | ICD-10-CM | POA: Diagnosis present

## 2015-04-09 DIAGNOSIS — D696 Thrombocytopenia, unspecified: Secondary | ICD-10-CM | POA: Diagnosis not present

## 2015-04-09 DIAGNOSIS — E876 Hypokalemia: Secondary | ICD-10-CM | POA: Diagnosis present

## 2015-04-09 DIAGNOSIS — Z886 Allergy status to analgesic agent status: Secondary | ICD-10-CM

## 2015-04-09 DIAGNOSIS — J9601 Acute respiratory failure with hypoxia: Secondary | ICD-10-CM | POA: Diagnosis present

## 2015-04-09 DIAGNOSIS — E860 Dehydration: Principal | ICD-10-CM | POA: Diagnosis present

## 2015-04-09 DIAGNOSIS — N390 Urinary tract infection, site not specified: Secondary | ICD-10-CM | POA: Diagnosis present

## 2015-04-09 DIAGNOSIS — I10 Essential (primary) hypertension: Secondary | ICD-10-CM | POA: Diagnosis present

## 2015-04-09 DIAGNOSIS — R55 Syncope and collapse: Secondary | ICD-10-CM

## 2015-04-09 DIAGNOSIS — Z9181 History of falling: Secondary | ICD-10-CM

## 2015-04-09 DIAGNOSIS — K219 Gastro-esophageal reflux disease without esophagitis: Secondary | ICD-10-CM | POA: Diagnosis present

## 2015-04-09 DIAGNOSIS — D751 Secondary polycythemia: Secondary | ICD-10-CM | POA: Diagnosis present

## 2015-04-09 DIAGNOSIS — E890 Postprocedural hypothyroidism: Secondary | ICD-10-CM | POA: Diagnosis present

## 2015-04-09 DIAGNOSIS — I951 Orthostatic hypotension: Secondary | ICD-10-CM | POA: Diagnosis present

## 2015-04-09 DIAGNOSIS — Z9049 Acquired absence of other specified parts of digestive tract: Secondary | ICD-10-CM

## 2015-04-09 DIAGNOSIS — Z90710 Acquired absence of both cervix and uterus: Secondary | ICD-10-CM

## 2015-04-09 DIAGNOSIS — M1990 Unspecified osteoarthritis, unspecified site: Secondary | ICD-10-CM | POA: Diagnosis present

## 2015-04-09 DIAGNOSIS — Z79899 Other long term (current) drug therapy: Secondary | ICD-10-CM

## 2015-04-09 DIAGNOSIS — R0902 Hypoxemia: Secondary | ICD-10-CM

## 2015-04-09 DIAGNOSIS — F419 Anxiety disorder, unspecified: Secondary | ICD-10-CM | POA: Diagnosis present

## 2015-04-09 DIAGNOSIS — T502X5A Adverse effect of carbonic-anhydrase inhibitors, benzothiadiazides and other diuretics, initial encounter: Secondary | ICD-10-CM | POA: Diagnosis present

## 2015-04-09 DIAGNOSIS — R739 Hyperglycemia, unspecified: Secondary | ICD-10-CM | POA: Diagnosis present

## 2015-04-09 DIAGNOSIS — R42 Dizziness and giddiness: Secondary | ICD-10-CM | POA: Diagnosis present

## 2015-04-09 DIAGNOSIS — F329 Major depressive disorder, single episode, unspecified: Secondary | ICD-10-CM | POA: Diagnosis present

## 2015-04-09 DIAGNOSIS — Z66 Do not resuscitate: Secondary | ICD-10-CM | POA: Diagnosis present

## 2015-04-09 DIAGNOSIS — Z79891 Long term (current) use of opiate analgesic: Secondary | ICD-10-CM

## 2015-04-09 DIAGNOSIS — E039 Hypothyroidism, unspecified: Secondary | ICD-10-CM | POA: Diagnosis present

## 2015-04-09 LAB — URINE MICROSCOPIC-ADD ON: RBC / HPF: NONE SEEN RBC/hpf (ref 0–5)

## 2015-04-09 LAB — CBC WITH DIFFERENTIAL/PLATELET
BASOS PCT: 1 %
Basophils Absolute: 0 10*3/uL (ref 0.0–0.1)
EOS ABS: 0 10*3/uL (ref 0.0–0.7)
Eosinophils Relative: 0 %
HEMATOCRIT: 44.1 % (ref 36.0–46.0)
HEMOGLOBIN: 16.3 g/dL — AB (ref 12.0–15.0)
LYMPHS ABS: 0.8 10*3/uL (ref 0.7–4.0)
Lymphocytes Relative: 20 %
MCH: 29.7 pg (ref 26.0–34.0)
MCHC: 37 g/dL — ABNORMAL HIGH (ref 30.0–36.0)
MCV: 80.3 fL (ref 78.0–100.0)
Monocytes Absolute: 0.7 10*3/uL (ref 0.1–1.0)
Monocytes Relative: 19 %
NEUTROS ABS: 2.4 10*3/uL (ref 1.7–7.7)
NEUTROS PCT: 60 %
Platelets: 143 10*3/uL — ABNORMAL LOW (ref 150–400)
RBC: 5.49 MIL/uL — ABNORMAL HIGH (ref 3.87–5.11)
RDW: 12.2 % (ref 11.5–15.5)
WBC: 4 10*3/uL (ref 4.0–10.5)

## 2015-04-09 LAB — BASIC METABOLIC PANEL
ANION GAP: 14 (ref 5–15)
Anion gap: 11 (ref 5–15)
BUN: 10 mg/dL (ref 6–20)
BUN: 8 mg/dL (ref 6–20)
CALCIUM: 8.9 mg/dL (ref 8.9–10.3)
CALCIUM: 7.9 mg/dL — AB (ref 8.9–10.3)
CHLORIDE: 79 mmol/L — AB (ref 101–111)
CO2: 27 mmol/L (ref 22–32)
CO2: 25 mmol/L (ref 22–32)
CREATININE: 0.79 mg/dL (ref 0.44–1.00)
Chloride: 88 mmol/L — ABNORMAL LOW (ref 101–111)
Creatinine, Ser: 0.86 mg/dL (ref 0.44–1.00)
GFR calc Af Amer: >60 mL/min (ref >60)
GFR calc non Af Amer: >60 mL/min (ref >60)
GLUCOSE: 160 mg/dL — AB (ref 65–99)
Glucose, Bld: 148 mg/dL — ABNORMAL HIGH (ref 65–99)
POTASSIUM: 3.5 mmol/L (ref 3.5–5.1)
Potassium: 3.1 mmol/L — ABNORMAL LOW (ref 3.5–5.1)
SODIUM: 120 mmol/L — AB (ref 135–145)
Sodium: 124 mmol/L — ABNORMAL LOW (ref 135–145)

## 2015-04-09 LAB — URINALYSIS, ROUTINE W REFLEX MICROSCOPIC
Bilirubin Urine: NEGATIVE
Glucose, UA: NEGATIVE mg/dL
Hgb urine dipstick: NEGATIVE
KETONES UR: NEGATIVE mg/dL
NITRITE: NEGATIVE
PROTEIN: NEGATIVE mg/dL
Specific Gravity, Urine: 1.007 (ref 1.005–1.030)
pH: 6.5 (ref 5.0–8.0)

## 2015-04-09 LAB — I-STAT CG4 LACTIC ACID, ED: LACTIC ACID, VENOUS: 1.09 mmol/L (ref 0.5–2.0)

## 2015-04-09 LAB — INFLUENZA PANEL BY PCR (TYPE A & B)
H1N1FLUPCR: NOT DETECTED
INFLAPCR: NEGATIVE
INFLBPCR: NEGATIVE

## 2015-04-09 LAB — MRSA PCR SCREENING: MRSA BY PCR: NEGATIVE

## 2015-04-09 LAB — TROPONIN I

## 2015-04-09 MED ORDER — KETOROLAC TROMETHAMINE 15 MG/ML IJ SOLN
15.0000 mg | Freq: Two times a day (BID) | INTRAMUSCULAR | Status: DC
Start: 2015-04-09 — End: 2015-04-10
  Administered 2015-04-09 – 2015-04-10 (×3): 15 mg via INTRAVENOUS
  Filled 2015-04-09 (×4): qty 1

## 2015-04-09 MED ORDER — FLUTICASONE PROPIONATE 50 MCG/ACT NA SUSP
2.0000 | Freq: Every day | NASAL | Status: DC
  Administered 2015-04-09 – 2015-04-12 (×5): 2 via NASAL
  Filled 2015-04-09: qty 16

## 2015-04-09 MED ORDER — POTASSIUM CHLORIDE CRYS ER 20 MEQ PO TBCR
40.0000 meq | EXTENDED_RELEASE_TABLET | Freq: Once | ORAL | Status: AC
Start: 2015-04-09 — End: 2015-04-09
  Administered 2015-04-09: 40 meq via ORAL
  Filled 2015-04-09: qty 2

## 2015-04-09 MED ORDER — IPRATROPIUM-ALBUTEROL 0.5-2.5 (3) MG/3ML IN SOLN
3.0000 mL | Freq: Four times a day (QID) | RESPIRATORY_TRACT | Status: DC
  Administered 2015-04-09: 3 mL via RESPIRATORY_TRACT
  Filled 2015-04-09: qty 3

## 2015-04-09 MED ORDER — ONDANSETRON HCL 4 MG/2ML IJ SOLN
4.0000 mg | Freq: Four times a day (QID) | INTRAMUSCULAR | Status: DC | PRN
  Administered 2015-04-09 – 2015-04-12 (×3): 4 mg via INTRAVENOUS
  Filled 2015-04-09 (×2): qty 2

## 2015-04-09 MED ORDER — ENOXAPARIN SODIUM 40 MG/0.4ML ~~LOC~~ SOLN
40.0000 mg | SUBCUTANEOUS | Status: DC
  Administered 2015-04-09 – 2015-04-10 (×2): 40 mg via SUBCUTANEOUS
  Filled 2015-04-09 (×2): qty 0.4

## 2015-04-09 MED ORDER — METHOCARBAMOL 500 MG PO TABS
500.0000 mg | ORAL_TABLET | Freq: Three times a day (TID) | ORAL | Status: DC
  Administered 2015-04-09 – 2015-04-10 (×3): 500 mg via ORAL
  Filled 2015-04-09 (×3): qty 1

## 2015-04-09 MED ORDER — LEVOTHYROXINE SODIUM 112 MCG PO TABS
112.0000 ug | ORAL_TABLET | Freq: Every day | ORAL | Status: DC
  Administered 2015-04-09 – 2015-04-12 (×4): 112 ug via ORAL
  Filled 2015-04-09 (×4): qty 1

## 2015-04-09 MED ORDER — IPRATROPIUM-ALBUTEROL 0.5-2.5 (3) MG/3ML IN SOLN
3.0000 mL | Freq: Once | RESPIRATORY_TRACT | Status: AC
  Administered 2015-04-09: 3 mL via RESPIRATORY_TRACT
  Filled 2015-04-09: qty 3

## 2015-04-09 MED ORDER — FAMOTIDINE 20 MG PO TABS
20.0000 mg | ORAL_TABLET | Freq: Two times a day (BID) | ORAL | Status: DC
  Administered 2015-04-09 – 2015-04-12 (×7): 20 mg via ORAL
  Filled 2015-04-09 (×7): qty 1

## 2015-04-09 MED ORDER — IPRATROPIUM-ALBUTEROL 0.5-2.5 (3) MG/3ML IN SOLN: 3.0000 mL | Freq: Four times a day (QID) | RESPIRATORY_TRACT | Status: DC

## 2015-04-09 MED ORDER — HYPROMELLOSE (GONIOSCOPIC) 2.5 % OP SOLN
1.0000 [drp] | OPHTHALMIC | Status: DC | PRN
  Filled 2015-04-09: qty 15

## 2015-04-09 MED ORDER — ESCITALOPRAM OXALATE 10 MG PO TABS
20.0000 mg | ORAL_TABLET | Freq: Every day | ORAL | Status: DC
  Administered 2015-04-09 – 2015-04-12 (×4): 20 mg via ORAL
  Filled 2015-04-09 (×4): qty 2

## 2015-04-09 MED ORDER — SODIUM CHLORIDE 0.9 % IV SOLN
1000.0000 mL | INTRAVENOUS | Status: DC
  Administered 2015-04-10: 1000 mL via INTRAVENOUS

## 2015-04-09 MED ORDER — MIDODRINE HCL 2.5 MG PO TABS
2.5000 mg | ORAL_TABLET | Freq: Three times a day (TID) | ORAL | Status: DC
Start: 2015-04-09 — End: 2015-04-10
  Administered 2015-04-09 – 2015-04-10 (×4): 2.5 mg via ORAL
  Filled 2015-04-09 (×6): qty 1

## 2015-04-09 MED ORDER — VANCOMYCIN HCL 500 MG IV SOLR
500.0000 mg | Freq: Two times a day (BID) | INTRAVENOUS | Status: DC
  Administered 2015-04-09 – 2015-04-10 (×3): 500 mg via INTRAVENOUS
  Filled 2015-04-09 (×4): qty 500

## 2015-04-09 MED ORDER — SODIUM CHLORIDE 0.9 % IV SOLN
1000.0000 mL | Freq: Once | INTRAVENOUS | Status: AC
  Administered 2015-04-09: 1000 mL via INTRAVENOUS

## 2015-04-09 MED ORDER — POTASSIUM CHLORIDE 10 MEQ/100ML IV SOLN
10.0000 meq | Freq: Once | INTRAVENOUS | Status: AC
  Administered 2015-04-09: 10 meq via INTRAVENOUS
  Filled 2015-04-09: qty 100

## 2015-04-09 MED ORDER — DEXTROSE 5 % IV SOLN
2.0000 g | INTRAVENOUS | Status: DC
  Administered 2015-04-09 – 2015-04-10 (×2): 2 g via INTRAVENOUS
  Filled 2015-04-09 (×2): qty 2

## 2015-04-09 MED ORDER — KETOTIFEN FUMARATE 0.025 % OP SOLN
1.0000 [drp] | Freq: Two times a day (BID) | OPHTHALMIC | Status: DC
  Administered 2015-04-09 – 2015-04-12 (×7): 1 [drp] via OPHTHALMIC
  Filled 2015-04-09 (×2): qty 5

## 2015-04-09 MED ORDER — IPRATROPIUM-ALBUTEROL 0.5-2.5 (3) MG/3ML IN SOLN
3.0000 mL | Freq: Four times a day (QID) | RESPIRATORY_TRACT | Status: DC
  Administered 2015-04-09 – 2015-04-10 (×3): 3 mL via RESPIRATORY_TRACT
  Filled 2015-04-09 (×4): qty 3

## 2015-04-09 MED ORDER — MENTHOL 3 MG MT LOZG
1.0000 | LOZENGE | OROMUCOSAL | Status: DC | PRN
  Filled 2015-04-09 (×5): qty 9

## 2015-04-09 MED ORDER — SODIUM CHLORIDE 0.9 % IV SOLN: INTRAVENOUS | Status: DC

## 2015-04-09 MED ORDER — BUDESONIDE 0.5 MG/2ML IN SUSP
0.2500 mg | Freq: Two times a day (BID) | RESPIRATORY_TRACT | Status: DC
  Administered 2015-04-09 (×2): 0.25 mg via RESPIRATORY_TRACT
  Filled 2015-04-09 (×2): qty 2

## 2015-04-09 MED ORDER — ONDANSETRON HCL 4 MG/2ML IJ SOLN
INTRAMUSCULAR | Status: AC
  Filled 2015-04-09: qty 2

## 2015-04-09 MED ORDER — BENZONATATE 100 MG PO CAPS
100.0000 mg | ORAL_CAPSULE | Freq: Three times a day (TID) | ORAL | Status: DC | PRN
Start: 2015-04-09 — End: 2015-04-10
  Administered 2015-04-09 – 2015-04-10 (×3): 100 mg via ORAL
  Filled 2015-04-09 (×4): qty 1

## 2015-04-09 MED ORDER — SODIUM CHLORIDE 0.9 % IV SOLN
1000.0000 mL | INTRAVENOUS | Status: DC
  Administered 2015-04-09: 1000 mL via INTRAVENOUS

## 2015-04-09 MED ORDER — TRAZODONE HCL 100 MG PO TABS
200.0000 mg | ORAL_TABLET | Freq: Every day | ORAL | Status: DC
  Administered 2015-04-09 – 2015-04-11 (×3): 200 mg via ORAL
  Filled 2015-04-09: qty 4
  Filled 2015-04-09 (×2): qty 2

## 2015-04-09 NOTE — Care Management Note (Signed)
Case Management Note  Patient Details  Name: Erika Conley MRN: [id number] Date of Birth: 1934/11/24  Subjective/Objective:     Adm w hypotension, nyponatremia               Action/Plan: from assisted living fac. sw ref made   Expected Discharge Date:                  Expected Discharge Plan:  Assisted Living / Rest Home  In-House Referral:  Clinical Social Work  Discharge planning Services     Post Acute Care Choice:    Choice offered to:     DME Arranged:    DME Agency:     HH Arranged:    [place] Agency:     Status of Service:     Medicare Important Message Given:    Date Medicare IM Given:    Medicare IM give by:    Date Additional Medicare IM Given:    Additional Medicare Important Message give by:     If discussed at [place] of Stay Meetings, dates discussed:    Additional Comments: ur review done  Lacretia Leigh, RN 04/09/2015, 10:32 AM

## 2015-04-09 NOTE — H&P (Signed)
[facility] History and Physical                                                                                    Erika Conley, is a 81 y.o. female  MRN: [id number]   DOB - Mar 16, 1934  Admit Date - 04/09/2015  Outpatient Primary MD for the patient is Leonard Downing, MD  Referring MD: Roxanne Mins / ER  PMH: Past Medical History  Diagnosis Date  . Falls frequently   . Arthritis   . Hypertension   . Fracture, clavicle   . Thyroid disease   . History of fractured vertebra   . Wrist fracture, right   . Seasonal allergies   . Depression   . Anxiety   . GERD (gastroesophageal reflux disease)   . Vertigo       PSH: Past Surgical History  Procedure Laterality Date  . Thyroidectomy    . Abdominal hysterectomy    . Back surgery    . Cholecystectomy    . Wrist surgery    . Fracture surgery       CC:  Chief Complaint  Patient presents with  . Loss of Consciousness     HPI: 81 year old female patient resident of an assisted living facility with a past medical history of frequent falls, osteoarthritis, hypertension, post-thyroidectomy hypothyroidism, depression and anxiety who was sent over from her facility after experiencing a witnessed syncopal episode. Patient reports that she has been sick for 2 weeks with off, dizziness and nausea with fevers. Over the past 3 days she's been worse with increasing fevers and myalgias. She went to see her PCP on Monday and was diagnosed with a viral illness and was given antitussive agents. She reports productive yellow sputum. She reports that prior to the syncopal episode she went to the bathroom felt dizzy when she stood up. She's having pleuritic chest pain but no chest pain with exertion or heaviness in her chest, no dyspnea on exertion. It was reported from the nursing home that apparently her lips became cyanotic during her episode and upon EMS arrival patient's O2 saturation was 91% on room air so she was placed on oxygen.  ER  Evaluation and treatment: Temp 98.3, BP 129/47, pulse 57, respirations 16. Orthostatic vital signs were strongly positive with supine systolic blood pressure [id number] that dropped to 101 with standing. EKG: Sinus bradycardia with ventricular rate 53 bpm, QTC 486 ms, no acute ischemic changes PCXR: Shallow inspiratory effort with atelectasis in the bases-per minute review had increased interstitial markings consistent with pneumonitis Laboratory data: Sodium 120, potassium 3.1, chloride 79, glucose 148, troponin less than 0.03, lactic acid 1.09, WBC 4000, hemoglobin 16.3, platelets 143,000, urinalysis and culture have been collected and are pending Normal saline bolus 2 L DuoNeb 1  Review of Systems   In addition to the HPI above,  No Headache, changes with Vision or hearing, new weakness, tingling, numbness in any extremity No problems swallowing food or Liquids, indigestion/reflux No Shortness of Breath, palpitations, orthopnea or DOE No Abdominal pain, N/V; no melena or hematochezia, no dark tarry stools No dysuria, hematuria or flank pain No new skin rashes, lesions, masses or bruises,  No new joints pains-aches No recent weight gain or loss No polyuria, polydypsia or polyphagia,  *A full 10 point Review of Systems was done, except as stated above, all other Review of Systems were negative.  Social History Social History  Substance Use Topics  . Smoking status: Never Smoker   . Smokeless tobacco: Never Used  . Alcohol Use: No    Resides at: [facility] which is an assisted living facility  Lives with: N/A  Ambulatory status: Without assistive devices   Family History Family History  Problem Relation Age of Onset  . Hypertension Other      Prior to Admission medications   Medication Sig Start Date End Date Taking? Authorizing Provider  acetaminophen (TYLENOL) 650 MG CR tablet Take 1,300 mg by mouth every 12 (twelve) hours.   Yes Historical Provider, MD    albuterol (PROVENTIL HFA;VENTOLIN HFA) 108 (90 BASE) MCG/ACT inhaler Inhale 2 puffs into the lungs every 4 (four) hours as needed for wheezing or shortness of breath (coough). 01/29/15  Yes Gareth Morgan, MD  Alcaftadine (LASTACAFT) 0.25 % SOLN Place 1 drop into both eyes at bedtime.   Yes Historical Provider, MD  antiseptic oral rinse (BIOTENE) LIQD Swish and spit 15 mLs 3 (three) times daily.    Yes Historical Provider, MD  benzocaine-resorcinol (VAGISIL) 5-2 % vaginal cream Place 1 application vaginally as needed for itching.   Yes Historical Provider, MD  benzonatate (TESSALON) 100 MG capsule Take 1 capsule (100 mg total) by mouth 3 (three) times daily as needed for cough. 01/29/15  Yes Gareth Morgan, MD  bismuth subsalicylate (PEPTO BISMOL) 262 MG/15ML suspension Take 30 mLs by mouth every 4 (four) hours as needed for diarrhea or loose stools.   Yes Historical Provider, MD  calcium-vitamin D (OSCAL WITH D) 500-200 MG-UNIT tablet Take 1 tablet by mouth daily.   Yes Historical Provider, MD  chlorhexidine (PERIDEX) 0.12 % solution Swish and spit 15 mLs 2 (two) times daily.   Yes Historical Provider, MD  escitalopram (LEXAPRO) 20 MG tablet Take 20 mg by mouth daily.   Yes Historical Provider, MD  fluticasone (FLONASE) 50 MCG/ACT nasal spray Place 2 sprays into both nostrils daily.   Yes Historical Provider, MD  hydrocortisone cream 1 % Apply 1 application topically every 6 (six) hours as needed for itching.   Yes Historical Provider, MD  hydroxypropyl methylcellulose / hypromellose (ISOPTO TEARS / GONIOVISC) 2.5 % ophthalmic solution Place 1 drop into both eyes every 4 (four) hours as needed for dry eyes.    Yes Historical Provider, MD  Infant Care Products (JOHNSONS BABY SHAMPOO EX) Apply topically. Dilute a pea size amount with water; close right eye, wash and rinse. Repeat twice daily for 2 days prior to injection.   Yes Historical Provider, MD  lactulose (CHRONULAC) 10 GM/15ML solution Take 10  g by mouth daily.   Yes Historical Provider, MD  levothyroxine (SYNTHROID, LEVOTHROID) 112 MCG tablet Take 112 mcg by mouth daily before breakfast.   Yes Historical Provider, MD  loperamide (IMODIUM) 2 MG capsule Take 2 mg by mouth daily as needed for diarrhea or loose stools. After each loose stool   Yes Historical Provider, MD  methocarbamol (ROBAXIN) 500 MG tablet Take 500 mg by mouth 3 (three) times daily.   Yes Historical Provider, MD  midodrine (PROAMATINE) 5 MG tablet Take 2.5 mg by mouth 3 (three) times daily with meals.   Yes Historical Provider, MD  Multiple Vitamins-Minerals (PRESERVISION AREDS) CAPS Take 1  capsule by mouth 2 (two) times daily.   Yes Historical Provider, MD  ondansetron (ZOFRAN) 8 MG tablet Take 8 mg by mouth every 8 (eight) hours as needed for nausea or vomiting.   Yes Historical Provider, MD  oxymetazoline (AFRIN) 0.05 % nasal spray Place 2 sprays into both nostrils 2 (two) times daily.   Yes Historical Provider, MD  promethazine-dextromethorphan (PROMETHAZINE-DM) 6.25-15 MG/5ML syrup Take 5 mLs by mouth 4 (four) times daily as needed for cough.   Yes Historical Provider, MD  ranitidine (ZANTAC) 150 MG tablet Take 150 mg by mouth 2 (two) times daily.   Yes Historical Provider, MD  tobramycin (TOBREX) 0.3 % ophthalmic solution Place 1 drop into the right eye 4 (four) times daily as needed (the day prior, the day of, and the day after treatment.).    Yes Historical Provider, MD  traZODone (DESYREL) 100 MG tablet Take 200 mg by mouth at bedtime.    Yes Historical Provider, MD  triamcinolone cream (KENALOG) 0.1 % Apply 1 application topically every 6 (six) hours as needed (itching).   Yes Historical Provider, MD  triamterene-hydrochlorothiazide (MAXZIDE) 75-50 MG tablet Take 1 tablet by mouth daily.   Yes Historical Provider, MD  trolamine salicylate (ASPERCREME) 10 % cream Apply 1 application topically every 8 (eight) hours as needed for muscle pain.    Yes Historical  Provider, MD    Allergies  Allergen Reactions  . Codeine     Pass out, fall  . Cortisone     Pass out, fall   . Other     All narcotics cause patient to Pass out, fall     Physical Exam  Vitals  Blood pressure 129/47, pulse 57, temperature 98 F (36.7 C), temperature source Oral, resp. rate 16, SpO2 92 %.   General:  In no acute distress, appears stated age and mildly toxic  Psych:  Normal affect, Denies Suicidal or Homicidal ideations, Awake Alert, Oriented X 3. Speech and thought patterns are clear and appropriate, no apparent short term memory deficits  Neuro:   No focal neurological deficits, CN II through XII intact, Strength 5/5 all 4 extremities, Sensation intact all 4 extremities.  ENT:  Ears and Eyes appear Normal, Conjunctivae clear, PER. Moist oral mucosa without erythema or exudates.  Neck:  Supple, No lymphadenopathy appreciated  Respiratory:  Symmetrical chest wall movement, then missed air movement bilaterally, fine expiratory crackles throughout. Liters  Cardiac:  RRR, No Murmurs, no LE edema noted, no JVD, No carotid bruits, peripheral pulses palpable at 2+  Abdomen:  Positive bowel sounds, Soft, Non tender, Non distended,  No masses appreciated, no obvious hepatosplenomegaly  Skin:  No Cyanosis, Normal Skin Turgor, No Skin Rash or Bruise.  Extremities: Symmetrical without obvious trauma or injury,  no effusions.  Data Review  CBC  Recent Labs Lab 04/09/15 0624  WBC 4.0  HGB 16.3*  HCT 44.1  PLT 143*  MCV 80.3  MCH 29.7  MCHC 37.0*  RDW 12.2  LYMPHSABS 0.8  MONOABS 0.7  EOSABS 0.0  BASOSABS 0.0    Chemistries   Recent Labs Lab 04/09/15 0624  NA 120*  K 3.1*  CL 79*  CO2 27  GLUCOSE 148*  BUN 10  CREATININE 0.86  CALCIUM 8.9    CrCl cannot be calculated (Unknown ideal weight.).  No results for input(s): TSH, T4TOTAL, T3FREE, THYROIDAB in the last 72 hours.  Invalid input(s): FREET3  Coagulation profile No results  for input(s): INR, PROTIME in the last  168 hours.  No results for input(s): DDIMER in the last 72 hours.  Cardiac Enzymes  Recent Labs Lab 04/09/15 0624  TROPONINI <0.03    Invalid input(s): POCBNP  Urinalysis    Component Value Date/Time   COLORURINE AMBER* 02/10/2015 0941   APPEARANCEUR CLEAR 02/10/2015 0941   LABSPEC 1.027 02/10/2015 0941   PHURINE 7.5 02/10/2015 0941   GLUCOSEU NEGATIVE 02/10/2015 0941   HGBUR NEGATIVE 02/10/2015 0941   BILIRUBINUR NEGATIVE 02/10/2015 0941   KETONESUR NEGATIVE 02/10/2015 0941   PROTEINUR NEGATIVE 02/10/2015 0941   UROBILINOGEN 0.2 11/21/2014 0501   NITRITE NEGATIVE 02/10/2015 0941   LEUKOCYTESUR SMALL* 02/10/2015 0941    Imaging results:   Dg Chest Port 1 View  04/09/2015  CLINICAL DATA:  Shortness of breath and cough. EXAM: PORTABLE CHEST 1 VIEW COMPARISON:  02/10/2015 FINDINGS: Shallow inspiration. Mild atelectasis in the lung bases. No focal airspace disease or consolidation. No blunting of costophrenic angles. No pneumothorax. Calcification of the aorta. IMPRESSION: Shallow inspiration with atelectasis in the bases. Electronically Signed   By: Lucienne Capers M.D.   On: 04/09/2015 06:46     EKG: (Independently reviewed)  Sinus bradycardia with ventricular rate 53 bpm, QTC 486 ms, no acute ischemic changes   Assessment & Plan  Principal Problem:   Syncope due to orthostatic hypotension -Suspect related to dehydration from ongoing upper respiratory illness for 2 weeks as well as potentially mediated by recent recurrent coughing; orthostatic vital signs were positive noting systolic blood pressure dropped from 139 supine to 101 with standing -Has known orthostatic hypotension and is on midodrine which we will continue -Admit to SDU/ Obs -Mobilize with assistance -Treat underlying causes -No prior echocardiogram so we'll obtain this admission for completeness of evaluation  Active Problems:   Dehydration with  hyponatremia -Replace volume -Baseline sodium 134 and current sodium 120 Will avoid rapid overcorrection -Neuro checks every 4 hours until sodium closer to baseline -Patient alert and no indication to utilize 3% normal saline -Based on medication reconciliation which has not been reviewed by the pharmacist patient appears to still be on Maxide-diuretics were supposed to have been discharged based on previous admission in September so need to clarify this and make sure patient is no longer taking this medication -Repeat electrolytes today at 11 AM and again tomorrow morning    Acute respiratory failure with hypoxia  -Patient has been having issues with cough and congestion for 2 weeks and has recently been treated as viral etiology in the outpatient setting -No focal infiltrates on chest x-ray but as precaution we'll treat as pneumonia process and since resides at assisted living facility will utilize HCAP protocol -Check for influenza -Cont supportive care with oxygen as well as nebulizers (DuoNeb and budesonide) -Reporting significant pleuritic chest discomfort so we'll give scheduled Toradol 15 mg IV every 12 hours and continue preadmission Tagamet-no PPI since patient receiving antibiotics -PCXR in am   Abnormal urinalysis -Few bacteria, small leukocytes, wbc's 6-30 so we'll treat as UTI at this juncture    Hypokalemia -Oral replete 1 and repeat lab in a.m. -Suspect related to dehydration and ongoing use of thiazide diuretic    Hypertension -Current blood pressure suboptimal and presents with orthostasis -Non-confirmed preadmission medication lists includes Maxide which based on last discharge summary was a postevent discontinued-to clarify patient is still taking this medication and if she is informed her to discontinue permanently    Hypothyroidism -Continue Synthroid    DVT Prophylaxis: Lovenox  Family Communication:   No  family at bedside  Code Status:  DO NOT  RESUSCITATE  Condition:  Stable  Discharge disposition: Medically stable anticipate discharge back to skilled nursing facility  Time spent in minutes : 60      ELLIS,ALLISON L. ANP on 04/09/2015 at 9:03 AM  You may contact me by going to [email or web address] - password [email or web address]  I am available from 7a-7p but please confirm I am on the schedule by going to Amion as above.   After 7p please contact night coverage person covering me after hours  [facility]

## 2015-04-09 NOTE — ED Notes (Signed)
Attempted report 

## 2015-04-09 NOTE — Progress Notes (Signed)
Patient got up out of chair without help and she is very weak asked her to please call the nurses or techs for help / fall prevention

## 2015-04-09 NOTE — Progress Notes (Signed)
  Echocardiogram 2D Echocardiogram has been performed.  Erika Conley 04/09/2015, 11:32 AM

## 2015-04-09 NOTE — ED Notes (Signed)
Pt presents from [facility] with [facility] for witnessed syncopal episode while patient was ambulating to the restroom with staff; Staff reports pt went unresponsive and apnic for unknown period of time and lips became cyanotic; pt now CAOx4 and VSS; pt reports "just not been feeling well"; staff reports "there is something going around"

## 2015-04-09 NOTE — ED Provider Notes (Signed)
CSN: [id number]     Arrival date & time 04/09/15  [id number] History   First MD Initiated Contact with Patient 04/09/15 0602     Chief Complaint  Patient presents with  . Loss of Consciousness     (Consider location/radiation/quality/duration/timing/severity/associated sxs/prior Treatment) Patient is a 81 y.o. female presenting with syncope. The history is provided by the patient.  Loss of Consciousness She was brought in by ambulance because of a syncopal episode. She states she has been sick for the last 3 days with nausea and cough and fever and body aches. Cough is productive of small amount of yellowish sputum. Tonight, she had to go to the bathroom but felt dizzy when she stood up. She was assisted to the bathroom and then when she walked back, she Dizzy again and passed out. She does not know how long she was unconscious for. She denies chest pain, heaviness, tightness, pressure. She denies dyspnea. Reported from the nursing home states that her lips became cyanotic. EMS reports initial oxygen saturation 91% she was placed on oxygen.  Past Medical History  Diagnosis Date  . Falls frequently   . Arthritis   . Hypertension   . Fracture, clavicle   . Thyroid disease   . History of fractured vertebra   . Wrist fracture, right   . Seasonal allergies   . Depression   . Anxiety   . GERD (gastroesophageal reflux disease)   . Vertigo    Past Surgical History  Procedure Laterality Date  . Thyroidectomy    . Abdominal hysterectomy    . Back surgery    . Cholecystectomy    . Wrist surgery    . Fracture surgery     Family History  Problem Relation Age of Onset  . Hypertension Other    Social History  Substance Use Topics  . Smoking status: Never Smoker   . Smokeless tobacco: Never Used  . Alcohol Use: No   OB History    No data available     Review of Systems  Cardiovascular: Positive for syncope.  All other systems reviewed and are negative.     Allergies  Codeine;  Cortisone; and Other  Home Medications   Prior to Admission medications   Medication Sig Start Date End Date Taking? Authorizing Provider  albuterol (PROVENTIL HFA;VENTOLIN HFA) 108 (90 BASE) MCG/ACT inhaler Inhale 2 puffs into the lungs every 4 (four) hours as needed for wheezing or shortness of breath (coough). 01/29/15   Gareth Morgan, MD  Alcaftadine (LASTACAFT) 0.25 % SOLN Place 1 drop into both eyes at bedtime.    Historical Provider, MD  antiseptic oral rinse (BIOTENE) LIQD 15 mLs by Mouth Rinse route 3 (three) times daily.    Historical Provider, MD  benzocaine-resorcinol (VAGISIL) 5-2 % vaginal cream Place 1 application vaginally as needed for itching.    Historical Provider, MD  benzonatate (TESSALON) 100 MG capsule Take 1 capsule (100 mg total) by mouth 3 (three) times daily as needed for cough. 01/29/15   Gareth Morgan, MD  bismuth subsalicylate (PEPTO BISMOL) 262 MG/15ML suspension Take 30 mLs by mouth every 4 (four) hours as needed for diarrhea or loose stools.    Historical Provider, MD  calcium-vitamin D (OSCAL WITH D) 500-200 MG-UNIT tablet Take 1 tablet by mouth daily.    Historical Provider, MD  cetirizine (ZYRTEC) 10 MG tablet Take 10 mg by mouth every evening.    Historical Provider, MD  chlorhexidine (PERIDEX) 0.12 % solution Swish and spit 15  mLs 2 (two) times daily.    Historical Provider, MD  FLUoxetine (PROZAC) 20 MG capsule Take 60 mg by mouth daily.    Historical Provider, MD  fluticasone (FLONASE) 50 MCG/ACT nasal spray Place 2 sprays into both nostrils daily.    Historical Provider, MD  hydroxypropyl methylcellulose / hypromellose (ISOPTO TEARS / GONIOVISC) 2.5 % ophthalmic solution 1 drop 4 (four) times daily as needed for dry eyes.    Historical Provider, MD  Infant Care Products (JOHNSONS BABY SHAMPOO EX) Apply topically. Dilute a pea size amount with water; close right eye, wash and rinse. Repeat twice daily for 2 days prior to injection.    Historical Provider,  MD  levothyroxine (SYNTHROID, LEVOTHROID) 112 MCG tablet Take 112 mcg by mouth daily before breakfast.    Historical Provider, MD  loperamide (IMODIUM) 2 MG capsule Take 2 mg by mouth. After each loose stool    Historical Provider, MD  meclizine (ANTIVERT) 25 MG tablet Take 1 tablet (25 mg total) by mouth every 8 (eight) hours. 11/12/14   Bonnielee Haff, MD  methocarbamol (ROBAXIN) 750 MG tablet Take 1,500 mg by mouth every 8 (eight) hours.    Historical Provider, MD  midodrine (PROAMATINE) 5 MG tablet Take 1 tablet (5 mg total) by mouth 3 (three) times daily with meals. 02/12/15   Nita Sells, MD  Multiple Vitamins-Minerals (PRESERVISION AREDS) CAPS Take 1 capsule by mouth 2 (two) times daily.    Historical Provider, MD  mupirocin nasal ointment (BACTROBAN NASAL) 2 % Place 1 application into the nose 2 (two) times daily. For five (5) days. Press sides of nose together and gently massage. 02/12/15   Nita Sells, MD  Nutritional Supplements (NUTRITIONAL DRINK MIX PO) Take 1 Dose by mouth daily. Mighty shake    Historical Provider, MD  ondansetron (ZOFRAN) 8 MG tablet Take 8 mg by mouth every 8 (eight) hours as needed for nausea or vomiting.    Historical Provider, MD  oxyCODONE-acetaminophen (PERCOCET/ROXICET) 5-325 MG tablet Take 2 tablets by mouth every 4 (four) hours as needed for moderate pain or severe pain. 02/12/15   Nita Sells, MD  pantoprazole (PROTONIX) 20 MG tablet Take 20 mg by mouth 2 (two) times daily.    Historical Provider, MD  polyethylene glycol (MIRALAX / GLYCOLAX) packet Take 17 g by mouth 2 (two) times daily. Patient not taking: Reported on 11/21/2014 11/12/14   Bonnielee Haff, MD  polyethylene glycol [facility]) packet Take 17 g by mouth daily. 02/12/15   Nita Sells, MD  tobramycin (TOBREX) 0.3 % ophthalmic solution Place 1 drop into the right eye 4 (four) times daily as needed (the day prior, the day of, and the day after treatment.).     Historical  Provider, MD  traZODone (DESYREL) 100 MG tablet Take 200 mg by mouth at bedtime.     Historical Provider, MD  triamcinolone cream (KENALOG) 0.1 % Apply 1 application topically every 6 (six) hours as needed (itching).    Historical Provider, MD  triamterene-hydrochlorothiazide (MAXZIDE) 75-50 MG tablet Take 1 tablet by mouth daily.    Historical Provider, MD  trolamine salicylate (ASPERCREME) 10 % cream Apply 1 application topically every 8 (eight) hours as needed for muscle pain.     Historical Provider, MD   BP 141/54 mmHg  Pulse 67  Temp(Src) 98 F (36.7 C) (Oral)  Resp 18  SpO2 99% Physical Exam  Nursing note and vitals reviewed.  81 year old female, resting comfortably and in no acute distress. Vital  signs are normal. Oxygen saturation is 99%, which is normal. Head is normocephalic and atraumatic. PERRLA, EOMI. Oropharynx is clear. Neck is nontender and supple without adenopathy or JVD. Back is nontender and there is no CVA tenderness. Lungs have coarse expiratory rhonchi without rales or wheezes. Chest is nontender. Heart has regular rate and rhythm without murmur. Abdomen is soft, flat, nontender without masses or hepatosplenomegaly and peristalsis is normoactive. Extremities have no cyanosis or edema, full range of motion is present. Skin is warm and dry without rash. Neurologic: Mental status is normal, cranial nerves are intact, there are no motor or sensory deficits.  ED Course  Procedures (including critical care time) Labs Review Results for orders placed or performed during the hospital encounter of [id number]  Basic metabolic panel  Result Value Ref Range   Sodium 120 (L) 135 - 145 mmol/L   Potassium 3.1 (L) 3.5 - 5.1 mmol/L   Chloride 79 (L) 101 - 111 mmol/L   CO2 27 22 - 32 mmol/L   Glucose, Bld 148 (H) 65 - 99 mg/dL   BUN 10 6 - 20 mg/dL   Creatinine, Ser 0.86 0.44 - 1.00 mg/dL   Calcium 8.9 8.9 - 10.3 mg/dL   GFR calc non Af Amer >60 >60 mL/min   GFR calc Af  Amer >60 >60 mL/min   Anion gap 14 5 - 15  CBC with Differential  Result Value Ref Range   WBC 4.0 4.0 - 10.5 K/uL   RBC 5.49 (H) 3.87 - 5.11 MIL/uL   Hemoglobin 16.3 (H) 12.0 - 15.0 g/dL   HCT 44.1 36.0 - 46.0 %   MCV 80.3 78.0 - 100.0 fL   MCH 29.7 26.0 - 34.0 pg   MCHC 37.0 (H) 30.0 - 36.0 g/dL   RDW 12.2 11.5 - 15.5 %   Platelets 143 (L) 150 - 400 K/uL   Neutrophils Relative % 60 %   Neutro Abs 2.4 1.7 - 7.7 K/uL   Lymphocytes Relative 20 %   Lymphs Abs 0.8 0.7 - 4.0 K/uL   Monocytes Relative 19 %   Monocytes Absolute 0.7 0.1 - 1.0 K/uL   Eosinophils Relative 0 %   Eosinophils Absolute 0.0 0.0 - 0.7 K/uL   Basophils Relative 1 %   Basophils Absolute 0.0 0.0 - 0.1 K/uL  Troponin I  Result Value Ref Range   Troponin I <0.03 <0.031 ng/mL  I-Stat CG4 Lactic Acid, ED  Result Value Ref Range   Lactic Acid, Venous 1.09 0.5 - 2.0 mmol/L   Imaging Review Dg Chest Port 1 View  04/09/2015  CLINICAL DATA:  Shortness of breath and cough. EXAM: PORTABLE CHEST 1 VIEW COMPARISON:  02/10/2015 FINDINGS: Shallow inspiration. Mild atelectasis in the lung bases. No focal airspace disease or consolidation. No blunting of costophrenic angles. No pneumothorax. Calcification of the aorta. IMPRESSION: Shallow inspiration with atelectasis in the bases. Electronically Signed   By: Lucienne Capers M.D.   On: 04/09/2015 06:46   I have personally reviewed and evaluated these images and lab results as part of my medical decision-making.   EKG Interpretation   Date/Time:  Friday April 09 2015 06:00:42 EST Ventricular Rate:  53 PR Interval:  200 QRS Duration: 101 QT Interval:  518 QTC Calculation: 486 R Axis:   -23 Text Interpretation:  Sinus rhythm Borderline left axis deviation  Borderline prolonged QT interval When compared with ECG of 02/10/2015, QT  has lengthened Confirmed by Roxanne Mins  MD, Shanoah Asbill ([id number]) on 04/09/2015 6:03:10  AM      MDM   Final diagnoses:  Hyponatremia  Hypokalemia   Orthostatic hypotension  Orthostatic syncope  Polycythemia  Thrombocytopenia (HCC)    Syncope which seems to be orthostatic. Orthostatic vital signs will be checked. Chest x-ray we obtained to rule out pneumonia and she'll be given albuterol with ipratropium. Old records are reviewed and she had been admitted to the hospital in December with similar complaints. She was found to have orthostatic hypotension and was placed on midodrine.  Orthostatic vital signs showed approximately 25 mm drop in systolic blood pressure. This likely accounts for her symptoms. Laboratory workup was significant for severe hyponatremia. Mild hypokalemia is also noted as well as borderline thrombocytopenia., And may indicate significant dehydration. She has been given IV fluids. Case is discussed with Ebony Hail from [facility] who agrees to admit the patient to stepdown unit.   Delora Fuel, MD [id number]

## 2015-04-09 NOTE — Progress Notes (Signed)
Pharmacy Antibiotic Note  Erika Conley is a 81 y.o. female admitted on 04/03/2015 with syncope d/t orthostatic hypotension, dehydration w/ hyponatremia, ARF due to suspected HCAP pneumonia, and possible UTI. Pharmacy has been consulted for Vancomycin dosing for pneumonia. WBC 4.0, afebrile  Plan:  Vancomycin 500mg  IV Q12h  Continue cefepime 2g IV q24 as ordered F/U cx's, renal fxn, LOT, VT @ ss   Height: 4\' 9"  (145 cm) Weight: 141.9 lbs (64.5 kg) IBW/kg (Calculated) : 43.2       Last Labs 2/17 WBC 4.0  2/17 SCr 0.86 2/17 LA 1.09 Estimated Creatinine Clearance: 41 mL/min (by C-G formula based on Cr of 1.63).   Antimicrobials this admission: 2/17: Vanc>>  2/17: Cefepime>>  Microbiology results: 2/17 BCx x 2>>sent  2/17 Sputum Cx>>sent  2/17 Urine Cx>>sent 2/17 MRSA PCR>>Positive  Thank you for allowing pharmacy to be a part of this patient's care.  Rise Patience, PharmD       I agree with the above assessment and plan.   Nena Jordan, PharmD, BCPS 04/09/2015, 12:00 PM

## 2015-04-09 NOTE — ED Notes (Signed)
Glick MD at bedside  

## 2015-04-10 ENCOUNTER — Observation Stay (HOSPITAL_COMMUNITY): Payer: Medicare Other

## 2015-04-10 DIAGNOSIS — Z9181 History of falling: Secondary | ICD-10-CM | POA: Diagnosis not present

## 2015-04-10 DIAGNOSIS — E890 Postprocedural hypothyroidism: Secondary | ICD-10-CM | POA: Diagnosis present

## 2015-04-10 DIAGNOSIS — K219 Gastro-esophageal reflux disease without esophagitis: Secondary | ICD-10-CM | POA: Diagnosis present

## 2015-04-10 DIAGNOSIS — Z9049 Acquired absence of other specified parts of digestive tract: Secondary | ICD-10-CM | POA: Diagnosis not present

## 2015-04-10 DIAGNOSIS — Z79899 Other long term (current) drug therapy: Secondary | ICD-10-CM | POA: Diagnosis not present

## 2015-04-10 DIAGNOSIS — E860 Dehydration: Secondary | ICD-10-CM | POA: Diagnosis present

## 2015-04-10 DIAGNOSIS — D751 Secondary polycythemia: Secondary | ICD-10-CM | POA: Diagnosis present

## 2015-04-10 DIAGNOSIS — R42 Dizziness and giddiness: Secondary | ICD-10-CM | POA: Diagnosis present

## 2015-04-10 DIAGNOSIS — E876 Hypokalemia: Secondary | ICD-10-CM | POA: Diagnosis present

## 2015-04-10 DIAGNOSIS — N390 Urinary tract infection, site not specified: Secondary | ICD-10-CM | POA: Diagnosis present

## 2015-04-10 DIAGNOSIS — F419 Anxiety disorder, unspecified: Secondary | ICD-10-CM | POA: Diagnosis present

## 2015-04-10 DIAGNOSIS — E871 Hypo-osmolality and hyponatremia: Secondary | ICD-10-CM | POA: Diagnosis present

## 2015-04-10 DIAGNOSIS — I951 Orthostatic hypotension: Secondary | ICD-10-CM | POA: Diagnosis present

## 2015-04-10 DIAGNOSIS — M1990 Unspecified osteoarthritis, unspecified site: Secondary | ICD-10-CM | POA: Diagnosis present

## 2015-04-10 DIAGNOSIS — Z66 Do not resuscitate: Secondary | ICD-10-CM | POA: Diagnosis present

## 2015-04-10 DIAGNOSIS — J9601 Acute respiratory failure with hypoxia: Secondary | ICD-10-CM | POA: Diagnosis present

## 2015-04-10 DIAGNOSIS — R739 Hyperglycemia, unspecified: Secondary | ICD-10-CM | POA: Diagnosis present

## 2015-04-10 DIAGNOSIS — T502X5A Adverse effect of carbonic-anhydrase inhibitors, benzothiadiazides and other diuretics, initial encounter: Secondary | ICD-10-CM | POA: Diagnosis present

## 2015-04-10 DIAGNOSIS — I10 Essential (primary) hypertension: Secondary | ICD-10-CM | POA: Diagnosis present

## 2015-04-10 DIAGNOSIS — Z79891 Long term (current) use of opiate analgesic: Secondary | ICD-10-CM | POA: Diagnosis not present

## 2015-04-10 DIAGNOSIS — Z90710 Acquired absence of both cervix and uterus: Secondary | ICD-10-CM | POA: Diagnosis not present

## 2015-04-10 DIAGNOSIS — Z886 Allergy status to analgesic agent status: Secondary | ICD-10-CM | POA: Diagnosis not present

## 2015-04-10 DIAGNOSIS — D696 Thrombocytopenia, unspecified: Secondary | ICD-10-CM | POA: Diagnosis present

## 2015-04-10 DIAGNOSIS — F329 Major depressive disorder, single episode, unspecified: Secondary | ICD-10-CM | POA: Diagnosis present

## 2015-04-10 LAB — URINE CULTURE

## 2015-04-10 MED ORDER — TRAMADOL HCL 50 MG PO TABS
50.0000 mg | ORAL_TABLET | Freq: Four times a day (QID) | ORAL | Status: DC | PRN
  Administered 2015-04-10 – 2015-04-11 (×2): 50 mg via ORAL
  Filled 2015-04-10 (×2): qty 1

## 2015-04-10 MED ORDER — BUDESONIDE 0.25 MG/2ML IN SUSP
0.2500 mg | Freq: Two times a day (BID) | RESPIRATORY_TRACT | Status: DC
  Administered 2015-04-10: 0.25 mg via RESPIRATORY_TRACT

## 2015-04-10 MED ORDER — BENZONATATE 100 MG PO CAPS
200.0000 mg | ORAL_CAPSULE | Freq: Three times a day (TID) | ORAL | Status: DC | PRN
  Administered 2015-04-10 – 2015-04-12 (×4): 200 mg via ORAL
  Filled 2015-04-10 (×4): qty 2

## 2015-04-10 MED ORDER — POTASSIUM CHLORIDE CRYS ER 20 MEQ PO TBCR
40.0000 meq | EXTENDED_RELEASE_TABLET | Freq: Once | ORAL | Status: AC
  Administered 2015-04-10: 40 meq via ORAL
  Filled 2015-04-10: qty 2

## 2015-04-10 MED ORDER — SODIUM CHLORIDE 0.9 % IV SOLN: INTRAVENOUS | Status: DC

## 2015-04-10 MED ORDER — ACETAMINOPHEN 325 MG PO TABS
650.0000 mg | ORAL_TABLET | Freq: Four times a day (QID) | ORAL | Status: DC | PRN
  Administered 2015-04-10 – 2015-04-12 (×4): 650 mg via ORAL
  Filled 2015-04-10 (×4): qty 2

## 2015-04-10 MED ORDER — DEXTROSE 5 % IV SOLN
1.0000 g | INTRAVENOUS | Status: DC
  Administered 2015-04-10 – 2015-04-11 (×2): 1 g via INTRAVENOUS
  Filled 2015-04-10 (×3): qty 10

## 2015-04-10 MED ORDER — METHOCARBAMOL 500 MG PO TABS
500.0000 mg | ORAL_TABLET | Freq: Four times a day (QID) | ORAL | Status: DC | PRN
  Administered 2015-04-11: 500 mg via ORAL
  Filled 2015-04-10: qty 1

## 2015-04-10 NOTE — Progress Notes (Signed)
[place] TEAM 1 - Stepdown/ICU TEAM PROGRESS NOTE  Erika Conley [id number] DOB: 05/17/1934 DOA: 04/09/2015 PCP: Leonard Downing, MD  Admit HPI / Brief Narrative: 81 year old female resident of an assisted living facility with a history of frequent falls, osteoarthritis, hypertension, post-thyroidectomy hypothyroidism, depression and anxiety who was sent over from her facility after experiencing a witnessed syncopal episode. Patient reported being sick for 2 weeks with dizziness and nausea with fevers. She went to see her PCP and was diagnosed with a viral illness and was given antitussive agents.   HPI/Subjective: The patient is resting comfortably in bed.  She states she feels somewhat better but not quite back to her baseline.  She reports a low-grade nausea but denies fevers or chills.  She reports an ongoing dry cough but no significant shortness of breath or chest pain.  Assessment/Plan:  Orthostatic hypotension w/ syncopal spell  -Likely diuretic induced dehydration with probable component of poor intake related to UTI -We'll try to avoid any further use of diuretics in this elderly female -Continue gentle hydration and follow with initiation of PT/OT  Hyponatremia w/ volume depletion  -Baseline sodium 134 - admit sodium 120 - steadily improving w/ volume expansion   Acute respiratory failure with hypoxia  -Patient c/o cough and congestion for 2 weeks - NOT presently hypoxic - f/u CXR after hydration w/o evidence of infiltrate - no focal infiltrates on admit chest x-ray - Flu negative - tx symptomatically   UTI -cont empiric abx and follow urine cx - narrow abx spectrum    Hypokalemia  -Due to use of thiazide diuretic - improving with replacement  Hx of Hypertension  -not an active concern at this time   Hypothyroidism  -Continue Synthroid   Hyperglycemia -No reported history of diabetes - check A1c  Code Status: DNR Family Communication: no family present  at time of exam Disposition Plan: Stable for transfer to telemetry bed - continue empiric antibiotic but narrow spectrum - initiate PT/OT  Consultants: none  Procedures: none  Antibiotics: Cefepime 2/17 > Vancomycin 2/17 >  DVT prophylaxis: lovenox   Objective: Blood pressure 130/53, pulse 57, temperature 98.7 F (37.1 C), temperature source Oral, resp. rate 15, height 4\' 9"  (1.448 m), weight 62.5 kg (137 lb 12.6 oz), SpO2 96 %.  Intake/Output Summary (Last 24 hours) at 04/10/15 1428 Last data filed at 04/10/15 1100  Gross per 24 hour  Intake   3229 ml  Output   2225 ml  Net   1004 ml   Exam: General: No acute respiratory distress Lungs: Clear to auscultation bilaterally without wheezes or crackles Cardiovascular: Regular rate and rhythm without murmur gallop or rub normal S1 and S2 Abdomen: Nontender, nondistended, soft, bowel sounds positive, no rebound, no ascites, no appreciable mass Extremities: No significant cyanosis, clubbing, or edema bilateral lower extremities  Data Reviewed:  Basic Metabolic Panel:  Recent Labs Lab 04/09/15 0624 04/09/15 1124  NA 120* 124*  K 3.1* 3.5  CL 79* 88*  CO2 27 25  GLUCOSE 148* 160*  BUN 10 8  CREATININE 0.86 0.79  CALCIUM 8.9 7.9*    CBC:  Recent Labs Lab 04/09/15 0624  WBC 4.0  NEUTROABS 2.4  HGB 16.3*  HCT 44.1  MCV 80.3  PLT 143*    Liver Function Tests: No results for input(s): AST, ALT, ALKPHOS, BILITOT, PROT, ALBUMIN in the last 168 hours. No results for input(s): LIPASE, AMYLASE in the last 168 hours. No results for input(s): AMMONIA in the last  168 hours.   Cardiac Enzymes:  Recent Labs Lab 04/09/15 0624  TROPONINI <0.03    CBG: No results for input(s): GLUCAP in the last 168 hours.  Recent Results (from the past 240 hour(s))  Urine culture     Status: None   Collection Time: 04/09/15  8:36 AM  Result Value Ref Range Status   Specimen Description URINE, CATHETERIZED  Final   Special  Requests NONE  Final   Culture MULTIPLE SPECIES PRESENT, SUGGEST RECOLLECTION  Final   Report Status 04/10/2015 FINAL  Final  MRSA PCR Screening     Status: None   Collection Time: 04/09/15 10:20 AM  Result Value Ref Range Status   MRSA by PCR NEGATIVE NEGATIVE Final    Comment:        The GeneXpert MRSA Assay (FDA approved for NASAL specimens only), is one component of a comprehensive MRSA colonization surveillance program. It is not intended to diagnose MRSA infection nor to guide or monitor treatment for MRSA infections.   Culture, blood (routine x 2) Call MD if unable to obtain prior to antibiotics being given     Status: None (Preliminary result)   Collection Time: 04/09/15 11:30 AM  Result Value Ref Range Status   Specimen Description BLOOD RIGHT ARM  Final   Special Requests BOTTLES DRAWN AEROBIC AND ANAEROBIC 6 CC  Final   Culture PENDING  Incomplete   Report Status PENDING  Incomplete     Studies:   Recent x-ray studies have been reviewed in detail by the Attending Physician  Scheduled Meds:  Scheduled Meds: . budesonide (PULMICORT) nebulizer solution  0.25 mg Nebulization BID  . ceFEPime (MAXIPIME) IV  2 g Intravenous Q24H  . enoxaparin (LOVENOX) injection  40 mg Subcutaneous Q24H  . escitalopram  20 mg Oral Daily  . famotidine  20 mg Oral BID  . fluticasone  2 spray Each Nare Daily  . ipratropium-albuterol  3 mL Nebulization Q6H  . ketorolac  15 mg Intravenous Q12H  . ketotifen  1 drop Both Eyes BID  . levothyroxine  112 mcg Oral QAC breakfast  . methocarbamol  500 mg Oral TID  . midodrine  2.5 mg Oral TID WC  . traZODone  200 mg Oral QHS  . vancomycin  500 mg Intravenous Q12H    Time spent on care of this patient: 35 mins   Tekila Caillouet T , MD   [facility] Office  [phone] Pager - Text Page per Shea Evans as per below:  On-Call/Text Page:      [email or web address]      password [email or web address]  If 7PM-7AM, please contact  night-coverage [email or web address] Password [email or web address] 04/10/2015, 2:28 PM   LOS: 1 day

## 2015-04-10 NOTE — Evaluation (Signed)
Physical Therapy Evaluation Patient Details Name: Erika Conley MRN: [id number] DOB: 03/23/1934 Today's Date: 04/10/2015   History of Present Illness  81 year old female resident of an assisted living facility with a history of frequent falls, osteoarthritis, hypertension, post-thyroidectomy hypothyroidism, depression and anxiety who was sent over from her facility after experiencing a witnessed syncopal episode. Patient reported being sick for 2 weeks with dizziness and nausea with fevers. She went to see her PCP and was diagnosed with a viral illness and was given antitussive agents.   Orthostatic hypotension, PNA and UTI.    Clinical Impression  Pt admitted with above diagnosis. Pt currently with functional limitations due to the deficits listed below (see PT Problem List). Pt was able to ambulate with RW but needed steadying assist due to dizziness.  Hopeful that pt will get back to baseline with treatment and be able to go to A living with HHPT.  Will follow acutely.  Pt will benefit from skilled PT to increase their independence and safety with mobility to allow discharge to the venue listed below.     Follow Up Recommendations Home health PT;Supervision for mobility/OOB    Equipment Recommendations  None recommended by PT    Recommendations for Other Services       Precautions / Restrictions Precautions Precautions: Fall Restrictions Weight Bearing Restrictions: No      Mobility  Bed Mobility Overal bed mobility: Independent             General bed mobility comments: took incr time  Transfers Overall transfer level: Needs assistance Equipment used: Rolling walker (2 wheeled) Transfers: Sit to/from Stand Sit to Stand: Min guard         General transfer comment: steadying assist upon standing as pt c/o dizziness.  Posterior lean at times.   Ambulation/Gait Ambulation/Gait assistance: Min assist Ambulation Distance (Feet): 45 Feet Assistive device: Rolling  walker (2 wheeled) Gait Pattern/deviations: Step-through pattern;Decreased stride length;Trunk flexed   Gait velocity interpretation: Below normal speed for age/gender General Gait Details: Pt asked to walk into bathroom to use it.  Pt walked into bathroom with RW with overall steady gait with one LOB needing assist due to pt felt dizzy.    Stairs            Wheelchair Mobility    Modified Rankin (Stroke Patients Only)       Balance Overall balance assessment: Needs assistance;History of Falls Sitting-balance support: No upper extremity supported;Feet supported Sitting balance-Leahy Scale: Fair   Postural control: Posterior lean Standing balance support: Bilateral upper extremity supported;During functional activity Standing balance-Leahy Scale: Poor Standing balance comment: Pt needed bil UE support in RW for stability. Unsteady due to dizziness at times..   Needed steadying asssit while wiping after used toilet having BM.               High level balance activites: Direction changes;Turns;Sudden stops High Level Balance Comments: min asssist with challenges.               Pertinent Vitals/Pain Pain Assessment: No/denies pain  VSS96% on RA, 125/47    Home Living Family/patient expects to be discharged to:: Assisted living Living Arrangements: Alone (has a roommate in her room) Available Help at Discharge: Available PRN/intermittently;Personal care attendant Type of Home: Apartment Home Access: Level entry     Home Layout: One level Home Equipment: Walker - 4 wheels;Cane - single point;Bedside commode;Shower seat Additional Comments: pt is from [facility]:  3 aides there, can call  for assistance but it takes awhile.  Pt states she had just been discharged from PT recently.  Walks 300-400 feet to dining room and front desk with rollator.     Prior Function Level of Independence: Independent with assistive device(s)         Comments: has  mostly been mod I but multiple falls; h/o vertigo and had syncopal episodes     Hand Dominance   Dominant Hand: Right    Extremity/Trunk Assessment   Upper Extremity Assessment: Defer to OT evaluation           Lower Extremity Assessment: Generalized weakness      Cervical / Trunk Assessment: Kyphotic  Communication   Communication: No difficulties  Cognition Arousal/Alertness: Awake/alert Behavior During Therapy: WFL for tasks assessed/performed Overall Cognitive Status: Within Functional Limits for tasks assessed                      General Comments      Exercises General Exercises - Lower Extremity Ankle Circles/Pumps: AROM;Both;5 reps;Seated Long Arc Quad: AROM;Both;5 reps;Seated      Assessment/Plan    PT Assessment Patient needs continued PT services  PT Diagnosis Generalized weakness   PT Problem List Decreased balance;Decreased activity tolerance;Decreased mobility;Decreased knowledge of use of DME;Decreased safety awareness;Decreased knowledge of precautions  PT Treatment Interventions DME instruction;Gait training;Functional mobility training;Therapeutic activities;Therapeutic exercise;Balance training;Patient/family education   PT Goals (Current goals can be found in the Care Plan section) Acute Rehab PT Goals Patient Stated Goal: to get better PT Goal Formulation: With patient Time For Goal Achievement: 04/24/15 Potential to Achieve Goals: Good    Frequency Min 3X/week   Barriers to discharge Decreased caregiver support      Co-evaluation               End of Session Equipment Utilized During Treatment: Gait belt Activity Tolerance: Patient limited by fatigue Patient left: in chair;with call bell/phone within reach;with chair alarm set Nurse Communication: Mobility status         Time: [id number] PT Time Calculation (min) (ACUTE ONLY): 25 min   Charges:   PT Evaluation $PT Eval Moderate Complexity: 1 Procedure PT  Treatments $Gait Training: 8-22 mins   PT G CodesDenice Paradise 14-Apr-2015, 3:45 PM  [place] Candido Flott,PT Acute Rehabilitation [phone] (pager)

## 2015-04-10 NOTE — Care Management Obs Status (Signed)
[facility]   Patient Details  Name: Erika Conley MRN: [id number] Date of Birth: 24-Sep-1934   Medicare Observation Status Notification Given:  Yes  MOON and C44 given: Syncope (orthostatic hypotension).  Dellie Catholic, RN 04/10/2015, 2:17 PM

## 2015-04-11 DIAGNOSIS — I951 Orthostatic hypotension: Secondary | ICD-10-CM

## 2015-04-11 LAB — COMPREHENSIVE METABOLIC PANEL
ALBUMIN: 3.3 g/dL — AB (ref 3.5–5.0)
ALK PHOS: 59 U/L (ref 38–126)
ALT: 12 U/L — ABNORMAL LOW (ref 14–54)
ANION GAP: 8 (ref 5–15)
AST: 17 U/L (ref 15–41)
BILIRUBIN TOTAL: 0.2 mg/dL — AB (ref 0.3–1.2)
BUN: 8 mg/dL (ref 6–20)
CALCIUM: 8.4 mg/dL — AB (ref 8.9–10.3)
CO2: 24 mmol/L (ref 22–32)
Chloride: 108 mmol/L (ref 101–111)
Creatinine, Ser: 0.63 mg/dL (ref 0.44–1.00)
GFR calc Af Amer: >60 mL/min (ref >60)
GLUCOSE: 81 mg/dL (ref 65–99)
Potassium: 4.3 mmol/L (ref 3.5–5.1)
Sodium: 140 mmol/L (ref 135–145)
TOTAL PROTEIN: 5.4 g/dL — AB (ref 6.5–8.1)

## 2015-04-11 LAB — CBC
HEMATOCRIT: 39 % (ref 36.0–46.0)
HEMOGLOBIN: 13.3 g/dL (ref 12.0–15.0)
MCH: 28.9 pg (ref 26.0–34.0)
MCHC: 34.1 g/dL (ref 30.0–36.0)
MCV: 84.6 fL (ref 78.0–100.0)
Platelets: 129 10*3/uL — ABNORMAL LOW (ref 150–400)
RBC: 4.61 MIL/uL (ref 3.87–5.11)
RDW: 13 % (ref 11.5–15.5)
WBC: 3.1 10*3/uL — ABNORMAL LOW (ref 4.0–10.5)

## 2015-04-11 MED ORDER — PANTOPRAZOLE SODIUM 40 MG PO TBEC
40.0000 mg | DELAYED_RELEASE_TABLET | Freq: Every day | ORAL | Status: DC
  Administered 2015-04-11 – 2015-04-12 (×2): 40 mg via ORAL
  Filled 2015-04-11 (×2): qty 1

## 2015-04-11 MED ORDER — MIDODRINE HCL 5 MG PO TABS
5.0000 mg | ORAL_TABLET | Freq: Three times a day (TID) | ORAL | Status: DC
  Administered 2015-04-11 – 2015-04-12 (×5): 5 mg via ORAL
  Filled 2015-04-11 (×5): qty 1

## 2015-04-11 MED ORDER — MECLIZINE HCL 25 MG PO TABS
25.0000 mg | ORAL_TABLET | Freq: Three times a day (TID) | ORAL | Status: DC
  Administered 2015-04-11 – 2015-04-12 (×4): 25 mg via ORAL
  Filled 2015-04-11 (×4): qty 1

## 2015-04-11 MED ORDER — GI COCKTAIL ~~LOC~~
30.0000 mL | Freq: Three times a day (TID) | ORAL | Status: DC
  Administered 2015-04-11 – 2015-04-12 (×2): 30 mL via ORAL
  Filled 2015-04-11 (×2): qty 30

## 2015-04-11 MED ORDER — SODIUM CHLORIDE 0.9 % IV SOLN
INTRAVENOUS | Status: DC
Start: 2015-04-11 — End: 2015-04-11

## 2015-04-11 MED ORDER — DEXTROSE-NACL 5-0.45 % IV SOLN
INTRAVENOUS | Status: AC
  Administered 2015-04-11: 50 mL/h via INTRAVENOUS
  Administered 2015-04-12: 1000 mL via INTRAVENOUS

## 2015-04-11 NOTE — Clinical Social Work Note (Signed)
CSW spoke with patient who said she is from [facility], and she plans to return once she is medically ready for discharge and discharge orders have been received.  Patient expressed she has been very pleased living there, and does not have any issues or concerns with returning back.  CSW to continue to follow patient's progress.  Jones Broom. [place], MSW, [place] 04/11/2015 5:50 PM

## 2015-04-11 NOTE — Progress Notes (Signed)
[facility] TEAM 1 - Stepdown/ICU TEAM PROGRESS NOTE  Erika Conley [id number] DOB: October 01, 1934 DOA: 04/09/2015 PCP: Leonard Downing, MD  Admit HPI / Brief Narrative:  81 year old female resident of an assisted living facility with a history of frequent falls, osteoarthritis, hypertension, post-thyroidectomy hypothyroidism, depression and anxiety who was sent over from her facility after experiencing a witnessed syncopal episode. Patient reported being sick for 2 weeks with dizziness and nausea with fevers. She went to see her PCP and was diagnosed with a viral illness and was given antitussive agents.   HPI/Subjective: The patient is resting comfortably in bed.  She states she feels somewhat better but not quite back to her baseline.  She describes symptoms consistent with vertigo, initial syncopal episode appears to be vasovagal while she was sitting on commode. Currently no chest or abdominal pain no shortness of breath or focal weakness.  Assessment/Plan:  Orthostatic hypotension w/ syncopal spell  -Was definitely dehydrated upon admission, continue IV fluids changed to D5 half normal, hold diuretic, also symptoms consistent with vasovagal syncope and chronic vertigo for which she takes Antivert. Resume Antivert, PT evaluation, increase activity. If stable likely discharged tomorrow.  Hyponatremia w/ volume depletion  -Admission sodium was 120, this morning sodium was close to 140, this is over 3 day period, have changed from normal saline to D5 half-normal at 50 mL an hour. Continue to monitor.  Acute respiratory failure with hypoxia  -Patient c/o cough and congestion for 2 weeks - NOT presently hypoxic - f/u CXR after hydration w/o evidence of infiltrate - no focal infiltrates on admit chest x-ray - Flu negative - tx symptomatically   UTI -cont empiric abx and follow urine cx - narrow abx spectrum    Hypokalemia  -Discontinued diuretic, potassium replaced and stable.  Hx  of Hypertension  -not an active concern at this time   Hypothyroidism  -Continue Synthroid   Hyperglycemia - follow A1c  No results found for: HGBA1C    Code Status: DNR Family Communication: no family present at time of exam Disposition Plan: Stable for transfer to telemetry bed - continue empiric antibiotic but narrow spectrum - initiate PT/OT  Consultants: none  Procedures: none  Antibiotics:  Anti-infectives    Start     Dose/Rate Route Frequency Ordered Stop   04/10/15 1600  cefTRIAXone (ROCEPHIN) 1 g in dextrose 5 % 50 mL IVPB     1 g 100 mL/hr over 30 Minutes Intravenous Every 24 hours 04/10/15 1449     04/09/15 1200  vancomycin (VANCOCIN) 500 mg in sodium chloride 0.9 % 100 mL IVPB  Status:  Discontinued     500 mg 100 mL/hr over 60 Minutes Intravenous Every 12 hours 04/09/15 1156 04/10/15 1450   04/09/15 1130  ceFEPIme (MAXIPIME) 2 g in dextrose 5 % 50 mL IVPB  Status:  Discontinued     2 g 100 mL/hr over 30 Minutes Intravenous Every 24 hours 04/09/15 1044 04/10/15 1450       DVT prophylaxis: lovenox   Objective: Blood pressure 158/72, pulse 59, temperature 98.9 F (37.2 C), temperature source Oral, resp. rate 15, height 4\' 9"  (1.448 m), weight 62.9 kg (138 lb 10.7 oz), SpO2 95 %.  Intake/Output Summary (Last 24 hours) at 04/11/15 0925 Last data filed at 04/11/15 0600  Gross per 24 hour  Intake    945 ml  Output    950 ml  Net     -5 ml   Exam: General: No acute respiratory distress  Lungs: Clear to auscultation bilaterally without wheezes or crackles Cardiovascular: Regular rate and rhythm without murmur gallop or rub normal S1 and S2 Abdomen: Nontender, nondistended, soft, bowel sounds positive, no rebound, no ascites, no appreciable mass Extremities: No significant cyanosis, clubbing, or edema bilateral lower extremities  Data Reviewed:  Basic Metabolic Panel:  Recent Labs Lab 04/09/15 0624 04/09/15 1124 04/11/15 0405  NA 120* 124* 140    K 3.1* 3.5 4.3  CL 79* 88* 108  CO2 27 25 24   GLUCOSE 148* 160* 81  BUN 10 8 8   CREATININE 0.86 0.79 0.63  CALCIUM 8.9 7.9* 8.4*    CBC:  Recent Labs Lab 04/09/15 0624 04/11/15 0405  WBC 4.0 3.1*  NEUTROABS 2.4  --   HGB 16.3* 13.3  HCT 44.1 39.0  MCV 80.3 84.6  PLT 143* 129*    Liver Function Tests:  Recent Labs Lab 04/11/15 0405  AST 17  ALT 12*  ALKPHOS 59  BILITOT 0.2*  PROT 5.4*  ALBUMIN 3.3*   No results for input(s): LIPASE, AMYLASE in the last 168 hours. No results for input(s): AMMONIA in the last 168 hours.   Cardiac Enzymes:  Recent Labs Lab 04/09/15 0624  TROPONINI <0.03    CBG: No results for input(s): GLUCAP in the last 168 hours.  Recent Results (from the past 240 hour(s))  Urine culture     Status: None   Collection Time: 04/09/15  8:36 AM  Result Value Ref Range Status   Specimen Description URINE, CATHETERIZED  Final   Special Requests NONE  Final   Culture MULTIPLE SPECIES PRESENT, SUGGEST RECOLLECTION  Final   Report Status 04/10/2015 FINAL  Final  MRSA PCR Screening     Status: None   Collection Time: 04/09/15 10:20 AM  Result Value Ref Range Status   MRSA by PCR NEGATIVE NEGATIVE Final    Comment:        The GeneXpert MRSA Assay (FDA approved for NASAL specimens only), is one component of a comprehensive MRSA colonization surveillance program. It is not intended to diagnose MRSA infection nor to guide or monitor treatment for MRSA infections.   Culture, blood (routine x 2) Call MD if unable to obtain prior to antibiotics being given     Status: None (Preliminary result)   Collection Time: 04/09/15 11:30 AM  Result Value Ref Range Status   Specimen Description BLOOD RIGHT ARM  Final   Special Requests BOTTLES DRAWN AEROBIC AND ANAEROBIC 6 CC  Final   Culture NO GROWTH 1 DAY  Final   Report Status PENDING  Incomplete  Culture, blood (routine x 2) Call MD if unable to obtain prior to antibiotics being given      Status: None (Preliminary result)   Collection Time: 04/09/15 11:40 AM  Result Value Ref Range Status   Specimen Description BLOOD LEFT HAND  Final   Special Requests IN PEDIATRIC BOTTLE 3 ML  Final   Culture NO GROWTH 1 DAY  Final   Report Status PENDING  Incomplete     Studies:   Recent x-ray studies have been reviewed in detail by the Attending Physician  Scheduled Meds:  Scheduled Meds: . cefTRIAXone (ROCEPHIN)  IV  1 g Intravenous Q24H  . escitalopram  20 mg Oral Daily  . famotidine  20 mg Oral BID  . fluticasone  2 spray Each Nare Daily  . ketotifen  1 drop Both Eyes BID  . levothyroxine  112 mcg Oral QAC breakfast  . meclizine  25 mg Oral TID  . midodrine  5 mg Oral TID WC  . traZODone  200 mg Oral QHS    Time spent on care of this patient: 35 mins  Signature  Thurnell Lose M.D on 04/11/2015 at 9:25 AM  Between 7am to 7pm - Pager - [phone], After 7pm go to [email or web address] - password [email or web address]  [phone]   LOS: 2 days

## 2015-04-12 LAB — HEMOGLOBIN A1C
Hgb A1c MFr Bld: 5.8 % — ABNORMAL HIGH (ref 4.8–5.6)
Mean Plasma Glucose: 120 mg/dL

## 2015-04-12 LAB — BASIC METABOLIC PANEL
ANION GAP: 7 (ref 5–15)
BUN: 5 mg/dL — ABNORMAL LOW (ref 6–20)
CHLORIDE: 103 mmol/L (ref 101–111)
CO2: 28 mmol/L (ref 22–32)
Calcium: 8.6 mg/dL — ABNORMAL LOW (ref 8.9–10.3)
Creatinine, Ser: 0.73 mg/dL (ref 0.44–1.00)
GFR calc non Af Amer: >60 mL/min (ref >60)
Glucose, Bld: 124 mg/dL — ABNORMAL HIGH (ref 65–99)
POTASSIUM: 4 mmol/L (ref 3.5–5.1)
SODIUM: 138 mmol/L (ref 135–145)

## 2015-04-12 MED ORDER — MECLIZINE HCL 25 MG PO TABS: 25.0000 mg | ORAL_TABLET | Freq: Three times a day (TID) | ORAL | Status: DC | PRN

## 2015-04-12 MED ORDER — GUAIFENESIN ER 600 MG PO TB12
600.0000 mg | ORAL_TABLET | Freq: Two times a day (BID) | ORAL | Status: DC | PRN
  Administered 2015-04-12: 600 mg via ORAL
  Filled 2015-04-12: qty 1

## 2015-04-12 NOTE — Care Management Note (Signed)
Case Management Note  Patient Details  Name: Erika Conley MRN: [id number] Date of Birth: [birth year]  Subjective/Objective:     Pt admitted for Syncope. Plan for return to ALF 04-12-15.                Action/Plan: Pt set up with [facility] for [place]. Referral was made and SOC to begin within 24-48 hrs post d/c. No further needs from CM at this time.   Expected Discharge Date:                  Expected Discharge Plan:  Assisted Living / Rest Home  In-House Referral:  Clinical Social Work  Discharge planning Services  CM Consult  Post Acute Care Choice:  Home Health Choice offered to:  NA  DME Arranged:  N/A DME Agency:  NA  HH Arranged:  PT HH Agency:  [place]  Status of Service:  Completed, signed off  Medicare Important Message Given:    Date Medicare IM Given:    Medicare IM give by:    Date Additional Medicare IM Given:    Additional Medicare Important Message give by:     If discussed at [place] of Stay Meetings, dates discussed:    Additional Comments:  Bethena Roys, RN 04/12/2015, 9:52 AM

## 2015-04-12 NOTE — Discharge Instructions (Signed)
Follow with Primary MD Erika Downing, MD in 7 days   Get CBC, CMP, 2 view Chest X ray checked  by Primary MD next visit.    Activity: As tolerated with Full fall precautions use walker/cane & assistance as needed   Disposition ALF   Diet:   Heart Healthy  .  For Heart failure patients - Check your Weight same time everyday, if you gain over 2 pounds, or you develop in leg swelling, experience more shortness of breath or chest pain, call your Primary MD immediately. Follow Cardiac Low Salt Diet and 1.5 lit/day fluid restriction.   On your next visit with your primary care physician please Get Medicines reviewed and adjusted.   Please request your Prim.MD to go over all Hospital Tests and Procedure/Radiological results at the follow up, please get all Hospital records sent to your Prim MD by signing hospital release before you go home.   If you experience worsening of your admission symptoms, develop shortness of breath, life threatening emergency, suicidal or homicidal thoughts you must seek medical attention immediately by calling 911 or calling your MD immediately  if symptoms less severe.  You Must read complete instructions/literature along with all the possible adverse reactions/side effects for all the Medicines you take and that have been prescribed to you. Take any new Medicines after you have completely understood and accpet all the possible adverse reactions/side effects.   Do not drive, operating heavy machinery, perform activities at heights, swimming or participation in water activities or provide baby sitting services if your were admitted for syncope or siezures until you have seen by Primary MD or a Neurologist and advised to do so again.  Do not drive when taking Pain medications.    Do not take more than prescribed Pain, Sleep and Anxiety Medications  Special Instructions: If you have smoked or chewed Tobacco  in the last 2 yrs please stop smoking, stop any  regular Alcohol  and or any Recreational drug use.  Wear Seat belts while driving.   Please note  You were cared for by a hospitalist during your hospital stay. If you have any questions about your discharge medications or the care you received while you were in the hospital after you are discharged, you can call the unit and asked to speak with the hospitalist on call if the hospitalist that took care of you is not available. Once you are discharged, your primary care physician will handle any further medical issues. Please note that NO REFILLS for any discharge medications will be authorized once you are discharged, as it is imperative that you return to your primary care physician (or establish a relationship with a primary care physician if you do not have one) for your aftercare needs so that they can reassess your need for medications and monitor your lab values.

## 2015-04-12 NOTE — Discharge Summary (Signed)
Erika Conley, is a 81 y.o. female  DOB [birth year]  MRN [id number].  Admission date:  04/09/2015  Admitting Physician  Delfina Redwood, MD  Discharge Date:  04/12/2015   Primary MD  Leonard Downing, MD  Recommendations for primary care physician for things to follow:   Monitor BMP and orthostatics, follow A1c results   Admission Diagnosis  Orthostatic hypotension [I95.1] Hypokalemia [E87.6] Polycythemia [D75.1] Hyponatremia [E87.1] Thrombocytopenia ([place]) [D69.6] Orthostatic syncope [I95.1]   Discharge Diagnosis  Orthostatic hypotension [I95.1] Hypokalemia [E87.6] Polycythemia [D75.1] Hyponatremia [E87.1] Thrombocytopenia ([place]) [D69.6] Orthostatic syncope [I95.1]     Principal Problem:   Syncope due to orthostatic hypotension Active Problems:   Hypokalemia   Hypertension   Hypothyroidism   Dehydration with hyponatremia   Acute respiratory failure with hypoxia (HCC)   Thrombocytopenia (HCC)      Past Medical History  Diagnosis Date  . Falls frequently   . Arthritis   . Hypertension   . Fracture, clavicle   . Thyroid disease   . History of fractured vertebra   . Wrist fracture, right   . Seasonal allergies   . Depression   . Anxiety   . GERD (gastroesophageal reflux disease)   . Vertigo     Past Surgical History  Procedure Laterality Date  . Thyroidectomy    . Abdominal hysterectomy    . Back surgery    . Cholecystectomy    . Wrist surgery    . Fracture surgery         HPI  from the history and physical done on the day of admission:    81 year old female patient resident of an assisted living facility with a past medical history of frequent falls, osteoarthritis, hypertension, post-thyroidectomy hypothyroidism, depression and anxiety who was sent over from her facility  after experiencing a witnessed syncopal episode. Patient reports that she has been sick for 2 weeks with off, dizziness and nausea with fevers. Over the past 3 days she's been worse with increasing fevers and myalgias. She went to see her PCP on Monday and was diagnosed with a viral illness and was given antitussive agents. She reports productive yellow sputum. She reports that prior to the syncopal episode she went to the bathroom felt dizzy when she stood up. She's having pleuritic chest pain but no chest pain with exertion or heaviness in her chest, no dyspnea on exertion. It was reported from the nursing home that apparently her lips became cyanotic during her episode and upon EMS arrival patient's O2 saturation was 91% on room air so she was placed on oxygen.     Hospital Course:     Orthostatic hypotension w/ syncopal spell  -Was definitely dehydrated upon admission, is now back to baseline after hydration, continue to hold diuretic upon discharge, also her symptoms upon presentation were  consistent with vasovagal syncope and chronic vertigo for which she takes Antivert. Resume Antivert ER and for chronic vertigo, she is now back to her baseline, underwent PT evaluation, will add home PT upon  discharge to ALF, follow with PCP closely. She is currently not orthostatic, echogram with preserved EF of 60% without any wall motion abnormality or acute valvular abnormalities.  Hyponatremia w/ volume depletion  -Admission sodium was 120, she was severely dehydrated clinically, has been hydrated adequately with stable BMP and blood pressures. We'll discontinue diuretic upon discharge she is chronically on midodrine which will be continued. Request PCP to monitor orthostatics and BMP closely.  Acute respiratory failure with hypoxia  -Patient c/o cough and congestion for 2 weeks - NOT presently hypoxic - chest x-ray stable. Symptoms much improved.  UTI -Finished treatment with 3 days of IV Rocephin.  Cultures inconclusive  Hypokalemia  -Discontinued diuretic, potassium replaced and stable.  Hx of Hypertension  -not an active concern at this time   Hypothyroidism  -Continue Synthroid   Hyperglycemia - follow A1c result pending  No results found for: HGBA1C      Discharge Condition: Fair  Follow UP  Follow-up Information    Follow up with Leonard Downing, MD. Schedule an appointment as soon as possible for a visit in 1 week.   Specialty:  Family Medicine   Contact information:   [place] [address] [phone]        Consults obtained - None  Diet and Activity recommendation: See Discharge Instructions below  Discharge Instructions       Discharge Instructions    Diet - low sodium heart healthy    Complete by:  As directed      Discharge instructions    Complete by:  As directed   Follow with Primary MD Leonard Downing, MD in 7 days   Get CBC, CMP, 2 view Chest X ray checked  by Primary MD next visit.    Activity: As tolerated with Full fall precautions use walker/cane & assistance as needed   Disposition ALF   Diet:   Heart Healthy  .  For Heart failure patients - Check your Weight same time everyday, if you gain over 2 pounds, or you develop in leg swelling, experience more shortness of breath or chest pain, call your Primary MD immediately. Follow Cardiac Low Salt Diet and 1.5 lit/day fluid restriction.   On your next visit with your primary care physician please Get Medicines reviewed and adjusted.   Please request your Prim.MD to go over all Hospital Tests and Procedure/Radiological results at the follow up, please get all Hospital records sent to your Prim MD by signing hospital release before you go home.   If you experience worsening of your admission symptoms, develop shortness of breath, life threatening emergency, suicidal or homicidal thoughts you must seek medical attention immediately by calling  911 or calling your MD immediately  if symptoms less severe.  You Must read complete instructions/literature along with all the possible adverse reactions/side effects for all the Medicines you take and that have been prescribed to you. Take any new Medicines after you have completely understood and accpet all the possible adverse reactions/side effects.   Do not drive, operating heavy machinery, perform activities at heights, swimming or participation in water activities or provide baby sitting services if your were admitted for syncope or siezures until you have seen by Primary MD or a Neurologist and advised to do so again.  Do not drive when taking Pain medications.    Do not take more than prescribed Pain, Sleep and Anxiety Medications  Special Instructions: If you have smoked or chewed Tobacco  in the last  2 yrs please stop smoking, stop any regular Alcohol  and or any Recreational drug use.  Wear Seat belts while driving.   Please note  You were cared for by a hospitalist during your hospital stay. If you have any questions about your discharge medications or the care you received while you were in the hospital after you are discharged, you can call the unit and asked to speak with the hospitalist on call if the hospitalist that took care of you is not available. Once you are discharged, your primary care physician will handle any further medical issues. Please note that NO REFILLS for any discharge medications will be authorized once you are discharged, as it is imperative that you return to your primary care physician (or establish a relationship with a primary care physician if you do not have one) for your aftercare needs so that they can reassess your need for medications and monitor your lab values.     Increase activity slowly    Complete by:  As directed              Discharge Medications       Medication List    STOP taking these medications         triamterene-hydrochlorothiazide 75-50 MG tablet  Commonly known as:  MAXZIDE      TAKE these medications        acetaminophen 650 MG CR tablet  Commonly known as:  TYLENOL  Take 1,300 mg by mouth every 12 (twelve) hours.     albuterol 108 (90 Base) MCG/ACT inhaler  Commonly known as:  PROVENTIL HFA;VENTOLIN HFA  Inhale 2 puffs into the lungs every 4 (four) hours as needed for wheezing or shortness of breath (coough).     antiseptic oral rinse Liqd  Swish and spit 15 mLs 3 (three) times daily.     benzocaine-resorcinol 5-2 % vaginal cream  Commonly known as:  VAGISIL  Place 1 application vaginally as needed for itching.     benzonatate 100 MG capsule  Commonly known as:  TESSALON  Take 1 capsule (100 mg total) by mouth 3 (three) times daily as needed for cough.     bismuth subsalicylate 99991111 [id number] suspension  Commonly known as:  PEPTO BISMOL  Take 30 mLs by mouth every 4 (four) hours as needed for diarrhea or loose stools.     calcium-vitamin D 500-200 MG-UNIT tablet  Commonly known as:  OSCAL WITH D  Take 1 tablet by mouth daily.     chlorhexidine 0.12 % solution  Commonly known as:  PERIDEX  Swish and spit 15 mLs 2 (two) times daily.     escitalopram 20 MG tablet  Commonly known as:  LEXAPRO  Take 20 mg by mouth daily.     fluticasone 50 MCG/ACT nasal spray  Commonly known as:  FLONASE  Place 2 sprays into both nostrils daily.     hydrocortisone cream 1 %  Apply 1 application topically every 6 (six) hours as needed for itching.     hydroxypropyl methylcellulose / hypromellose 2.5 % ophthalmic solution  Commonly known as:  ISOPTO TEARS / GONIOVISC  Place 1 drop into both eyes every 4 (four) hours as needed for dry eyes.     JOHNSONS BABY SHAMPOO EX  Apply topically. Dilute a pea size amount with water; close right eye, wash and rinse. Repeat twice daily for 2 days prior to injection.     lactulose 10 GM/15ML solution  Commonly known as:  CHRONULAC  Take 10 g  by mouth daily.     LASTACAFT 0.25 % Soln  Generic drug:  Alcaftadine  Place 1 drop into both eyes at bedtime.     levothyroxine 112 MCG tablet  Commonly known as:  SYNTHROID, LEVOTHROID  Take 112 mcg by mouth daily before breakfast.     loperamide 2 MG capsule  Commonly known as:  IMODIUM  Take 2 mg by mouth daily as needed for diarrhea or loose stools. After each loose stool     meclizine 25 MG tablet  Commonly known as:  ANTIVERT  Take 1 tablet (25 mg total) by mouth 3 (three) times daily as needed for dizziness.     methocarbamol 500 MG tablet  Commonly known as:  ROBAXIN  Take 500 mg by mouth 3 (three) times daily.     midodrine 5 MG tablet  Commonly known as:  PROAMATINE  Take 2.5 mg by mouth 3 (three) times daily with meals.     ondansetron 8 MG tablet  Commonly known as:  ZOFRAN  Take 8 mg by mouth every 8 (eight) hours as needed for nausea or vomiting.     oxymetazoline 0.05 % nasal spray  Commonly known as:  AFRIN  Place 2 sprays into both nostrils 2 (two) times daily.     PRESERVISION AREDS Caps  Take 1 capsule by mouth 2 (two) times daily.     promethazine-dextromethorphan 6.25-15 MG/5ML syrup  Commonly known as:  PROMETHAZINE-DM  Take 5 mLs by mouth 4 (four) times daily as needed for cough.     ranitidine 150 MG tablet  Commonly known as:  ZANTAC  Take 150 mg by mouth 2 (two) times daily.     tobramycin 0.3 % ophthalmic solution  Commonly known as:  TOBREX  Place 1 drop into the right eye 4 (four) times daily as needed (the day prior, the day of, and the day after treatment.).     traZODone 100 MG tablet  Commonly known as:  DESYREL  Take 200 mg by mouth at bedtime.     triamcinolone cream 0.1 %  Commonly known as:  KENALOG  Apply 1 application topically every 6 (six) hours as needed (itching).     trolamine salicylate 10 % cream  Commonly known as:  ASPERCREME  Apply 1 application topically every 8 (eight) hours as needed for muscle pain.         Major procedures and Radiology Reports - PLEASE review detailed and final reports for all details, in brief -     TTE  Left ventricle: The cavity size was normal. Systolic function was normal. The estimated ejection fraction was in the range of 55%to 60%. Wall motion was normal; there were no regional wallmotion abnormalities. There was an increased relativecontribution of atrial contraction to ventricular filling.Doppler parameters are consistent with abnormal left ventricular relaxation (grade 1 diastolic dysfunction). - Mitral valve: There was trivial regurgitation.  Dg Chest 2 View  04/10/2015  CLINICAL DATA:  Hypoxia, htn, depression, anxiety EXAM: CHEST  2 VIEW COMPARISON:  04/09/2015 FINDINGS: Cardiac silhouette normal in size and configuration. No mediastinal or hilar masses or evidence of adenopathy. Clear lungs.  No pleural effusion or pneumothorax. Stable compression fractures at the thoracolumbar junction, 2 previously treated with vertebroplasty. Old left proximal humeral fracture. Bony thorax is demineralized. IMPRESSION: No acute cardiopulmonary disease. Electronically Signed   By: Lajean Manes M.D.   On: 04/10/2015 08:35   Dg [facility]  04/09/2015  CLINICAL DATA:  Shortness of breath and cough. EXAM: PORTABLE CHEST 1 VIEW COMPARISON:  02/10/2015 FINDINGS: Shallow inspiration. Mild atelectasis in the lung bases. No focal airspace disease or consolidation. No blunting of costophrenic angles. No pneumothorax. Calcification of the aorta. IMPRESSION: Shallow inspiration with atelectasis in the bases. Electronically Signed   By: Lucienne Capers M.D.   On: 04/09/2015 06:46    Micro Results      Recent Results (from the past 240 hour(s))  Urine culture     Status: None   Collection Time: 04/09/15  8:36 AM  Result Value Ref Range Status   Specimen Description URINE, CATHETERIZED  Final   Special Requests NONE  Final   Culture MULTIPLE SPECIES PRESENT, SUGGEST  RECOLLECTION  Final   Report Status 04/10/2015 FINAL  Final  MRSA PCR Screening     Status: None   Collection Time: 04/09/15 10:20 AM  Result Value Ref Range Status   MRSA by PCR NEGATIVE NEGATIVE Final    Comment:        The GeneXpert MRSA Assay (FDA approved for NASAL specimens only), is one component of a comprehensive MRSA colonization surveillance program. It is not intended to diagnose MRSA infection nor to guide or monitor treatment for MRSA infections.   Culture, blood (routine x 2) Call MD if unable to obtain prior to antibiotics being given     Status: None (Preliminary result)   Collection Time: 04/09/15 11:30 AM  Result Value Ref Range Status   Specimen Description BLOOD RIGHT ARM  Final   Special Requests BOTTLES DRAWN AEROBIC AND ANAEROBIC 6 CC  Final   Culture NO GROWTH 2 DAYS  Final   Report Status PENDING  Incomplete  Culture, blood (routine x 2) Call MD if unable to obtain prior to antibiotics being given     Status: None (Preliminary result)   Collection Time: 04/09/15 11:40 AM  Result Value Ref Range Status   Specimen Description BLOOD LEFT HAND  Final   Special Requests IN PEDIATRIC BOTTLE 3 ML  Final   Culture NO GROWTH 2 DAYS  Final   Report Status PENDING  Incomplete    Today   Subjective   Almetta Gervin today has no headache,no chest abdominal pain,no new weakness tingling or numbness, feels much better wants to go home today.     Objective   Blood pressure 154/78, pulse 52, temperature 98.7 F (37.1 C), temperature source Oral, resp. rate 13, height 4\' 9"  (1.448 m), weight 62.3 kg (137 lb 5.6 oz), SpO2 93 %.   Intake/Output Summary (Last 24 hours) at 04/12/15 0928 Last data filed at 04/12/15 [id number]  Gross per 24 hour  Intake 2204.17 ml  Output   2125 ml  Net  79.17 ml    Exam Awake Alert, Oriented x 3, No new F.N deficits, Normal affect [place].AT,PERRAL Supple Neck,No JVD, No cervical lymphadenopathy appriciated.  Symmetrical Chest wall  movement, Good air movement bilaterally, CTAB RRR,No Gallops,Rubs or new Murmurs, No Parasternal Heave +ve B.Sounds, Abd Soft, Non tender, No organomegaly appriciated, No rebound -guarding or rigidity. No Cyanosis, Clubbing or edema, No new Rash or bruise   Data Review   CBC w Diff: Lab Results  Component Value Date   WBC 3.1* 04/11/2015   HGB 13.3 04/11/2015   HCT 39.0 04/11/2015   PLT 129* 04/11/2015   LYMPHOPCT 20 04/09/2015   MONOPCT 19 04/09/2015   EOSPCT 0 04/09/2015   BASOPCT 1 04/09/2015    CMP:  Lab Results  Component Value Date   NA 138 04/12/2015   K 4.0 04/12/2015   CL 103 04/12/2015   CO2 28 04/12/2015   BUN 5* 04/12/2015   CREATININE 0.73 04/12/2015   PROT 5.4* 04/11/2015   ALBUMIN 3.3* 04/11/2015   BILITOT 0.2* 04/11/2015   ALKPHOS 59 04/11/2015   AST 17 04/11/2015   ALT 12* 04/11/2015  .   Total Time in preparing paper work, data evaluation and todays exam - 35 minutes  Thurnell Lose M.D on 04/12/2015 at 9:28 AM  [facility]   Office  [phone]

## 2015-04-12 NOTE — Progress Notes (Signed)
Physical Therapy Treatment Patient Details Name: Erika Conley MRN: [id number] DOB: 05-03-1934 Today's Date: 04/12/2015    History of Present Illness 81 year old female resident of an assisted living facility with a history of frequent falls, osteoarthritis, hypertension, post-thyroidectomy hypothyroidism, depression and anxiety who was sent over from her facility after experiencing a witnessed syncopal episode. Patient reported being sick for 2 weeks with dizziness and nausea with fevers. She went to see her PCP and was diagnosed with a viral illness and was given antitussive agents.   Orthostatic hypotension, PNA and UTI.      PT Comments    Pt w/ min instability w/ high level balance activities while ambulating, drifting w/ head turns.  Completed balance exercises standing at counter in room.  Pt will benefit from continued skilled PT services to increase functional independence and safety.   Follow Up Recommendations  Home health PT;Supervision for mobility/OOB     Equipment Recommendations  None recommended by PT    Recommendations for Other Services       Precautions / Restrictions Precautions Precautions: Fall Restrictions Weight Bearing Restrictions: No    Mobility  Bed Mobility Overal bed mobility: Modified Independent             General bed mobility comments: Increased time  Transfers Overall transfer level: Needs assistance Equipment used: Rolling walker (2 wheeled) Transfers: Sit to/from Stand Sit to Stand: Supervision         General transfer comment: Cues for hand placement.  No physical assist needed.  Ambulation/Gait Ambulation/Gait assistance: Min guard Ambulation Distance (Feet): 60 Feet Assistive device: Rolling walker (2 wheeled) Gait Pattern/deviations: Step-through pattern;Decreased stride length;Antalgic;Trunk flexed;Drifts right/left;Shuffle   Gait velocity interpretation: Below normal speed for age/gender General Gait Details:  Drifts w/ head turns and verbal cues for upright posture.  Pt takes very short steps due to fear of falling.   Stairs            Wheelchair Mobility    Modified Rankin (Stroke Patients Only)       Balance Overall balance assessment: Needs assistance Sitting-balance support: No upper extremity supported;Feet supported Sitting balance-Leahy Scale: Good     Standing balance support: Bilateral upper extremity supported;During functional activity Standing balance-Leahy Scale: Poor Standing balance comment: Relies on RW for support               High Level Balance Comments: Pt drifts w/ head turns    Cognition Arousal/Alertness: Awake/alert Behavior During Therapy: WFL for tasks assessed/performed Overall Cognitive Status: Within Functional Limits for tasks assessed                      Exercises General Exercises - Lower Extremity Ankle Circles/Pumps: AROM;Both;Seated;15 reps Long Arc Quad: AROM;Both;Seated;10 reps Hip Flexion/Marching: AROM;Both;10 reps;Standing Other Exercises Other Exercises: SLS x1 w/ 1 UE supported on counter: Lt SLS 19 seconds, Rt SLS 26 seconds Other Exercises: Rhomberg w/ eyes open (30 seconds w/ 1 UE supported), Rhomberg w/ eyes closed (8 seconds, stopped due to nausea)    General Comments        Pertinent Vitals/Pain Pain Assessment: No/denies pain    Home Living                      Prior Function            PT Goals (current goals can now be found in the care plan section) Acute Rehab PT Goals Patient Stated Goal: "to be able  to take care of myself" PT Goal Formulation: With patient Time For Goal Achievement: 04/24/15 Potential to Achieve Goals: Good Progress towards PT goals: Progressing toward goals    Frequency  Min 3X/week    PT Plan Current plan remains appropriate    Co-evaluation             End of Session Equipment Utilized During Treatment: Gait belt Activity Tolerance: Patient  limited by fatigue Patient left: in chair;with call bell/phone within reach;with chair alarm set     Time: [id number] PT Time Calculation (min) (ACUTE ONLY): 25 min  Charges:  $Gait Training: 8-22 mins $Therapeutic Exercise: 8-22 mins                    G Codes:      Joslyn Hy PT, [place] [id number] Pager: [phone] 04/12/2015, 10:44 AM

## 2015-04-12 NOTE — Evaluation (Signed)
Occupational Therapy Evaluation and Discharge Patient Details Name: Erika Conley MRN: [id number] DOB: [birth year] Today's Date: 04/12/2015    History of Present Illness 81 year old female resident of an assisted living facility with a history of frequent falls, osteoarthritis, hypertension, post-thyroidectomy hypothyroidism, depression and anxiety who was sent over from her facility after experiencing a witnessed syncopal episode. Patient reported being sick for 2 weeks with dizziness and nausea with fevers. She went to see her PCP and was diagnosed with a viral illness and was given antitussive agents.   Orthostatic hypotension, PNA and UTI.     Clinical Impression   Pt was supervised for all mobility and ADL transfers prior to admission.  She used a Radiation protection practitioner. Pt presents with generalized weakness and impaired balance.  Requires supervision to min guard assistance for ADL.  No further OT needs. Pt will continue to have supervision at her ALF.     Follow Up Recommendations  No OT follow up;Supervision/Assistance - 24 hour    Equipment Recommendations       Recommendations for Other Services       Precautions / Restrictions Precautions Precautions: Fall Restrictions Weight Bearing Restrictions: No      Mobility Bed Mobility Overal bed mobility: Modified Independent             General bed mobility comments: Increased time  Transfers Overall transfer level: Needs assistance Equipment used: Rolling walker (2 wheeled) Transfers: Sit to/from Stand Sit to Stand: Supervision         General transfer comment: Cues for hand placement.  No physical assist needed.    Balance Overall balance assessment: Needs assistance Sitting-balance support: No upper extremity supported;Feet supported Sitting balance-Leahy Scale: Good     Standing balance support: Bilateral upper extremity supported;During functional activity Standing balance-Leahy Scale: Poor Standing balance  comment: Relies on RW for support               High Level Balance Comments: Pt drifts w/ head turns            ADL Overall ADL's : Needs assistance/impaired Eating/Feeding: Independent;Sitting   Grooming: Wash/dry hands;Standing;Supervision/safety   Upper Body Bathing: Supervision/ safety;Sitting   Lower Body Bathing: Supervison/ safety;Sit to/from stand   Upper Body Dressing : Sitting;Set up   Lower Body Dressing: Supervision/safety;Sit to/from stand   Toilet Transfer: Min guard;Ambulation;RW;Comfort height toilet   Toileting- Clothing Manipulation and Hygiene: Supervision/safety;Sit to/from stand       Functional mobility during ADLs: Supervision/safety;Rolling walker       Vision     Perception     Praxis      Pertinent Vitals/Pain Pain Assessment: No/denies pain     Hand Dominance Right   Extremity/Trunk Assessment Upper Extremity Assessment Upper Extremity Assessment: Overall WFL for tasks assessed (recent R elbow injury from fall in shower)   Lower Extremity Assessment Lower Extremity Assessment: Generalized weakness   Cervical / Trunk Assessment Cervical / Trunk Assessment: Kyphotic   Communication Communication Communication: No difficulties   Cognition Arousal/Alertness: Awake/alert Behavior During Therapy: WFL for tasks assessed/performed Overall Cognitive Status: Within Functional Limits for tasks assessed                     General Comments       Exercises Exercises: General Lower Extremity;Other exercises Other Exercises Other Exercises: SLS x1 w/ 1 UE supported on counter: Lt SLS 19 seconds, Rt SLS 26 seconds Other Exercises: Rhomberg w/ eyes open (30 seconds w/ 1 UE supported),  Rhomberg w/ eyes closed (8 seconds, stopped due to nausea)   Shoulder Instructions      Home Living Family/patient expects to be discharged to:: Assisted living   Available Help at Discharge: Personal care attendant;Available 24  hours/day Type of Home: Assisted living Home Access: Level entry     Home Layout: One level     Bathroom Shower/Tub: Occupational psychologist: Standard     Home Equipment: Environmental consultant - 4 wheels;Cane - single point;Bedside commode;Shower seat   Additional Comments: pt is from [place] [place] home:  3 aides there, can call for assistance but it takes awhile.  Pt states she had just been discharged from PT recently.  Walks 300-400 feet to dining room and front desk with rollator.       Prior Functioning/Environment Level of Independence: Independent with assistive device(s)        Comments: Has had closer supervision since syncopal episodes started.    OT Diagnosis: Generalized weakness   OT Problem List:     OT Treatment/Interventions:      OT Goals(Current goals can be found in the care plan section) Acute Rehab OT Goals Patient Stated Goal: "to be able to take care of myself"  OT Frequency:     Barriers to D/C:            Co-evaluation              End of Session    Activity Tolerance: Patient tolerated treatment well Patient left: in bed;with call bell/phone within reach   Time: 1315-1330 OT Time Calculation (min): 15 min Charges:  OT General Charges $OT Visit: 1 Procedure OT Evaluation $OT Eval Low Complexity: 1 Procedure G-Codes:    Malka So 04/12/2015, 1:38 PM  [phone]

## 2015-04-12 NOTE — Progress Notes (Signed)
RN called report to [employer]. IVs have been removed. Pt verbalizes understanding

## 2015-04-12 NOTE — Clinical Social Work Note (Signed)
Clinical Social Work Assessment  Patient Details  Name: Erika Conley MRN: [id number] Date of Birth: 26-Aug-1934  Date of referral:  04/12/15               Reason for consult:  Facility Placement                Permission sought to share information with:  Family Supports, Customer service manager Permission granted to share information::  Yes, Verbal Permission Granted  Name::     Erika Conley  Agency::  [facility]  Relationship::  Son  Contact Information:  [phone]  Housing/Transportation Living arrangements for the past 2 months:  [place] of Information:  Patient Patient Interpreter Needed:  None Criminal Activity/Legal Involvement Pertinent to Current Situation/Hospitalization:  No - Comment as needed Significant Relationships:  Adult Children Lives with:  Facility Resident, Self Do you feel safe going back to the place where you live?  Yes Need for family participation in patient care:  Yes (Comment)  Care giving concerns:  Patient lives at an ALF in [place] and plans to return at discharge.   Social Worker assessment / plan:  BSW intern met with patient at bedside to complete assessment. Patient confirmed that she is from [facility]F and the plan is to return at discharge. Patient stated that she has a son and a daughter that lives nearby and the patient requested that I contact her son. BSW intern confirmed with facility that patient is able to return and the floor NCM will set up home health PT with [facility]. BSW inter will continue to follow and assist as needed.  Employment status:  Retired Forensic scientist:  Medicare PT Recommendations:  Home with [place] (ALF) Information / Referral to community resources:  Other (Comment Required) [facility])  Patient/Family's Response to care:  Patient appeared to be pleased with care she is receiving at the  hospital.  Patient/Family's Understanding of and Emotional Response to Diagnosis, Current Treatment, and Prognosis:  Patient appeared to understand the diagnosis, current treatment, and post discharge needs.  Emotional Assessment Appearance:  Appears older than stated age Attitude/Demeanor/Rapport:  Other (Appropriate, pleasant) Affect (typically observed):  Appropriate, Pleasant Orientation:  Oriented to Self, Oriented to Place, Oriented to  Time, Oriented to Situation Alcohol / Substance use:  Not Applicable Psych involvement (Current and /or in the community):  No (Comment)  Discharge Needs  Concerns to be addressed:  No discharge needs identified Readmission within the last 30 days:  No Current discharge risk:  None Barriers to Discharge:  No Barriers Identified    [place] Intern, [phone]

## 2015-04-12 NOTE — NC FL2 (Signed)
[place] LEVEL OF CARE SCREENING TOOL     IDENTIFICATION  Patient Name: Erika Conley Birthdate: 1934/05/10 Sex: female Admission Date (Current Location): 04/09/2015  [facility] and [place] Number:  Herbalist and Address:  [facility] [place], [place] [address], [place], [place] [address]      Provider Number: [id number]  Attending Physician Name and Address:  Thurnell Lose, MD  Relative Name and Phone Number:  Macee Taormina [phone]    Current Level of Care: Hospital Recommended Level of Care: [facility]) Prior Approval Number:    Date Approved/Denied:   PASRR Number:    Discharge Plan: Other (Comment) [facility])    Current Diagnoses: Patient Active Problem List   Diagnosis Date Noted  . Dehydration with hyponatremia 04/09/2015  . Acute respiratory failure with hypoxia ([place]) 04/09/2015  . Thrombocytopenia ([place]) 04/09/2015  . Hypokalemia 11/10/2014  . Hypertension 11/10/2014  . Hypothyroidism 11/10/2014  . Syncope due to orthostatic hypotension 11/10/2014  . Constipated   . UTI (urinary tract infection) 06/04/2013  . Altered mental status 06/04/2013    Orientation RESPIRATION BLADDER Height & Weight     Self, Time, Situation, Place  Normal Continent Weight: 137 lb 5.6 oz (62.3 kg) Height:  4\' 9"  (144.8 cm)  BEHAVIORAL SYMPTOMS/MOOD NEUROLOGICAL BOWEL NUTRITION STATUS   (n/a)  (n/a) Continent Diet (Heart Healthy)  AMBULATORY STATUS COMMUNICATION OF NEEDS Skin   Limited Assist Verbally Normal                       Personal Care Assistance Level of Assistance  Bathing, Feeding, Dressing Bathing Assistance: Maximum assistance Feeding assistance: Limited assistance Dressing Assistance: Maximum assistance     Functional Limitations Info  Sight, Hearing, Speech Sight Info: Adequate Hearing Info: Adequate Speech Info: Adequate    SPECIAL CARE FACTORS  FREQUENCY  PT (By licensed PT)     PT Frequency: 5x/week (Home Health PT)              Contractures Contractures Info: Not present    Additional Factors Info  Code Status, Allergies Code Status Info: DNR Allergies Info: Codiene, Cortisone, Narcotics cause patient to pass out/fall           Current Medications (04/12/2015):  This is the current hospital active medication list Current Facility-Administered Medications  Medication Dose Route Frequency Provider Last Rate Last Dose  . acetaminophen (TYLENOL) tablet 650 mg  650 mg Oral Q6H PRN Cherene Altes, MD   650 mg at 04/11/15 1637  . benzonatate (TESSALON) capsule 200 mg  200 mg Oral TID PRN Cherene Altes, MD   200 mg at 04/12/15 0201  . cefTRIAXone (ROCEPHIN) 1 g in dextrose 5 % 50 mL IVPB  1 g Intravenous Q24H Cherene Altes, MD   1 g at 04/11/15 1628  . escitalopram (LEXAPRO) tablet 20 mg  20 mg Oral Daily Samella Parr, NP   20 mg at 04/12/15 [id number]  . famotidine (PEPCID) tablet 20 mg  20 mg Oral BID Samella Parr, NP   20 mg at 04/12/15 [id number]  . fluticasone (FLONASE) 50 MCG/ACT nasal spray 2 spray  2 spray Each Nare Daily Samella Parr, NP   2 spray at 04/12/15 0815  . gi cocktail (Maalox,Lidocaine,Donnatal)  30 mL Oral TID Thurnell Lose, MD   30 mL at 04/12/15 0813  . guaiFENesin (MUCINEX) 12 hr tablet 600  mg  600 mg Oral BID PRN Thurnell Lose, MD      . hydroxypropyl methylcellulose / hypromellose (ISOPTO TEARS / GONIOVISC) 2.5 % ophthalmic solution 1 drop  1 drop Both Eyes Q4H PRN Samella Parr, NP      . ketotifen (ZADITOR) 0.025 % ophthalmic solution 1 drop  1 drop Both Eyes BID Samella Parr, NP   1 drop at 04/12/15 [id number]  . levothyroxine (SYNTHROID, LEVOTHROID) tablet 112 mcg  112 mcg Oral QAC breakfast Samella Parr, NP   112 mcg at 04/12/15 0813  . meclizine (ANTIVERT) tablet 25 mg  25 mg Oral TID Thurnell Lose, MD   25 mg at 04/12/15 0813  . menthol-cetylpyridinium (CEPACOL) lozenge 3 mg   1 lozenge Oral PRN Delfina Redwood, MD      . methocarbamol (ROBAXIN) tablet 500 mg  500 mg Oral Q6H PRN Cherene Altes, MD   500 mg at 04/11/15 0913  . midodrine (PROAMATINE) tablet 5 mg  5 mg Oral TID WC Thurnell Lose, MD   5 mg at 04/12/15 0815  . ondansetron (ZOFRAN) injection 4 mg  4 mg Intravenous Q6H PRN Delfina Redwood, MD   4 mg at 04/12/15 0443  . pantoprazole (PROTONIX) EC tablet 40 mg  40 mg Oral Daily Thurnell Lose, MD   40 mg at 04/12/15 0815  . traMADol (ULTRAM) tablet 50 mg  50 mg Oral Q6H PRN Cherene Altes, MD   50 mg at 04/11/15 1339  . traZODone (DESYREL) tablet 200 mg  200 mg Oral QHS Samella Parr, NP   200 mg at 04/11/15 2131     Discharge Medications: Please see discharge summary for a list of discharge medications.  Relevant Imaging Results:  Relevant Lab Results:   Additional Information SS# [id number]   [place] Intern, [id number]

## 2015-04-12 NOTE — Care Management Important Message (Signed)
Important Message  Patient Details  Name: ADRINA PUMPHREY MRN: [id number] Date of Birth: March 24, 1934   Medicare Important Message Given:  Yes    Loann Quill 04/12/2015, 11:21 AM

## 2015-04-12 NOTE — Clinical Social Work Note (Signed)
Clinical Social Worker facilitated patient discharge including contacting patient family and facility to confirm patient discharge plans.  Clinical information faxed to facility and family agreeable with plan.  CSW arranged ambulance transport via [facility] to [facility].  RN to call report prior to discharge.  Clinical Social Worker will sign off for now as social work intervention is no longer needed. Please consult [place] again if new need arises.  Barbette Or, [place]

## 2015-04-14 LAB — CULTURE, BLOOD (ROUTINE X 2)
CULTURE: NO GROWTH
Culture: NO GROWTH

## 2015-10-22 ENCOUNTER — Inpatient Hospital Stay (HOSPITAL_COMMUNITY): Payer: Medicare Other

## 2015-10-22 ENCOUNTER — Inpatient Hospital Stay (HOSPITAL_COMMUNITY): Payer: Medicare Other | Admitting: Certified Registered"

## 2015-10-22 ENCOUNTER — Emergency Department (HOSPITAL_COMMUNITY): Payer: Medicare Other

## 2015-10-22 ENCOUNTER — Encounter (HOSPITAL_COMMUNITY): Payer: Self-pay | Admitting: Emergency Medicine

## 2015-10-22 ENCOUNTER — Inpatient Hospital Stay (HOSPITAL_COMMUNITY)
Admission: EM | Admit: 2015-10-22 | Discharge: 2015-10-29 | DRG: 444 | Disposition: A | Discharge status: Home Health | Payer: Medicare Other | Attending: Internal Medicine | Admitting: Internal Medicine

## 2015-10-22 ENCOUNTER — Encounter (HOSPITAL_COMMUNITY)
Admission: EM | Disposition: A | Discharge status: Home Health | Payer: Self-pay | Source: Home / Self Care | Attending: Internal Medicine

## 2015-10-22 DIAGNOSIS — M797 Fibromyalgia: Secondary | ICD-10-CM | POA: Diagnosis present

## 2015-10-22 DIAGNOSIS — E039 Hypothyroidism, unspecified: Secondary | ICD-10-CM | POA: Diagnosis present

## 2015-10-22 DIAGNOSIS — R748 Abnormal levels of other serum enzymes: Secondary | ICD-10-CM | POA: Diagnosis present

## 2015-10-22 DIAGNOSIS — Z66 Do not resuscitate: Secondary | ICD-10-CM | POA: Diagnosis present

## 2015-10-22 DIAGNOSIS — Z86010 Personal history of colonic polyps: Secondary | ICD-10-CM

## 2015-10-22 DIAGNOSIS — K831 Obstruction of bile duct: Secondary | ICD-10-CM | POA: Diagnosis present

## 2015-10-22 DIAGNOSIS — K858 Other acute pancreatitis without necrosis or infection: Secondary | ICD-10-CM | POA: Diagnosis not present

## 2015-10-22 DIAGNOSIS — I10 Essential (primary) hypertension: Secondary | ICD-10-CM | POA: Diagnosis present

## 2015-10-22 DIAGNOSIS — K760 Fatty (change of) liver, not elsewhere classified: Secondary | ICD-10-CM | POA: Diagnosis present

## 2015-10-22 DIAGNOSIS — M4804 Spinal stenosis, thoracic region: Secondary | ICD-10-CM | POA: Diagnosis present

## 2015-10-22 DIAGNOSIS — Z9049 Acquired absence of other specified parts of digestive tract: Secondary | ICD-10-CM

## 2015-10-22 DIAGNOSIS — R05 Cough: Secondary | ICD-10-CM

## 2015-10-22 DIAGNOSIS — R7989 Other specified abnormal findings of blood chemistry: Secondary | ICD-10-CM | POA: Diagnosis present

## 2015-10-22 DIAGNOSIS — Z7951 Long term (current) use of inhaled steroids: Secondary | ICD-10-CM

## 2015-10-22 DIAGNOSIS — E860 Dehydration: Secondary | ICD-10-CM | POA: Diagnosis present

## 2015-10-22 DIAGNOSIS — M1990 Unspecified osteoarthritis, unspecified site: Secondary | ICD-10-CM | POA: Diagnosis present

## 2015-10-22 DIAGNOSIS — Z888 Allergy status to other drugs, medicaments and biological substances status: Secondary | ICD-10-CM

## 2015-10-22 DIAGNOSIS — R1010 Upper abdominal pain, unspecified: Secondary | ICD-10-CM | POA: Diagnosis not present

## 2015-10-22 DIAGNOSIS — F419 Anxiety disorder, unspecified: Secondary | ICD-10-CM | POA: Diagnosis present

## 2015-10-22 DIAGNOSIS — E876 Hypokalemia: Secondary | ICD-10-CM | POA: Diagnosis present

## 2015-10-22 DIAGNOSIS — Z79899 Other long term (current) drug therapy: Secondary | ICD-10-CM

## 2015-10-22 DIAGNOSIS — K838 Other specified diseases of biliary tract: Secondary | ICD-10-CM | POA: Diagnosis not present

## 2015-10-22 DIAGNOSIS — Z885 Allergy status to narcotic agent status: Secondary | ICD-10-CM

## 2015-10-22 DIAGNOSIS — K859 Acute pancreatitis without necrosis or infection, unspecified: Secondary | ICD-10-CM | POA: Diagnosis not present

## 2015-10-22 DIAGNOSIS — R059 Cough, unspecified: Secondary | ICD-10-CM

## 2015-10-22 DIAGNOSIS — E038 Other specified hypothyroidism: Secondary | ICD-10-CM | POA: Diagnosis not present

## 2015-10-22 DIAGNOSIS — Z8249 Family history of ischemic heart disease and other diseases of the circulatory system: Secondary | ICD-10-CM

## 2015-10-22 DIAGNOSIS — R945 Abnormal results of liver function studies: Secondary | ICD-10-CM | POA: Diagnosis present

## 2015-10-22 DIAGNOSIS — G8929 Other chronic pain: Secondary | ICD-10-CM | POA: Diagnosis present

## 2015-10-22 DIAGNOSIS — Y848 Other medical procedures as the cause of abnormal reaction of the patient, or of later complication, without mention of misadventure at the time of the procedure: Secondary | ICD-10-CM | POA: Diagnosis not present

## 2015-10-22 DIAGNOSIS — K219 Gastro-esophageal reflux disease without esophagitis: Secondary | ICD-10-CM | POA: Diagnosis present

## 2015-10-22 DIAGNOSIS — R197 Diarrhea, unspecified: Secondary | ICD-10-CM | POA: Diagnosis present

## 2015-10-22 DIAGNOSIS — K8689 Other specified diseases of pancreas: Secondary | ICD-10-CM | POA: Diagnosis present

## 2015-10-22 DIAGNOSIS — T85520A Displacement of bile duct prosthesis, initial encounter: Secondary | ICD-10-CM

## 2015-10-22 DIAGNOSIS — R1013 Epigastric pain: Secondary | ICD-10-CM | POA: Diagnosis present

## 2015-10-22 DIAGNOSIS — K869 Disease of pancreas, unspecified: Secondary | ICD-10-CM | POA: Diagnosis not present

## 2015-10-22 DIAGNOSIS — F329 Major depressive disorder, single episode, unspecified: Secondary | ICD-10-CM | POA: Diagnosis present

## 2015-10-22 DIAGNOSIS — R109 Unspecified abdominal pain: Secondary | ICD-10-CM | POA: Diagnosis present

## 2015-10-22 HISTORY — PX: ERCP: SHX5425

## 2015-10-22 LAB — URINALYSIS, ROUTINE W REFLEX MICROSCOPIC
Glucose, UA: NEGATIVE mg/dL
Hgb urine dipstick: NEGATIVE
Ketones, ur: 40 mg/dL — AB
NITRITE: POSITIVE — AB
PH: 7 (ref 5.0–8.0)
Protein, ur: NEGATIVE mg/dL
SPECIFIC GRAVITY, URINE: 1.009 (ref 1.005–1.030)

## 2015-10-22 LAB — MRSA PCR SCREENING: MRSA BY PCR: NEGATIVE

## 2015-10-22 LAB — COMPREHENSIVE METABOLIC PANEL
ALBUMIN: 4.2 g/dL (ref 3.5–5.0)
ALK PHOS: 263 U/L — AB (ref 38–126)
ALT: 375 U/L — AB (ref 14–54)
AST: 471 U/L — ABNORMAL HIGH (ref 15–41)
Anion gap: 9 (ref 5–15)
BUN: 11 mg/dL (ref 6–20)
CALCIUM: 9.2 mg/dL (ref 8.9–10.3)
CHLORIDE: 95 mmol/L — AB (ref 101–111)
CO2: 29 mmol/L (ref 22–32)
Creatinine, Ser: 0.64 mg/dL (ref 0.44–1.00)
GFR calc non Af Amer: >60 mL/min (ref >60)
GLUCOSE: 126 mg/dL — AB (ref 65–99)
Potassium: 3.2 mmol/L — ABNORMAL LOW (ref 3.5–5.1)
SODIUM: 133 mmol/L — AB (ref 135–145)
Total Bilirubin: 8 mg/dL — ABNORMAL HIGH (ref 0.3–1.2)
Total Protein: 6.9 g/dL (ref 6.5–8.1)

## 2015-10-22 LAB — CBC WITH DIFFERENTIAL/PLATELET
BASOS ABS: 0 10*3/uL (ref 0.0–0.1)
Basophils Relative: 0 %
Eosinophils Absolute: 0 10*3/uL (ref 0.0–0.7)
Eosinophils Relative: 0 %
HCT: 41.9 % (ref 36.0–46.0)
Hemoglobin: 15 g/dL (ref 12.0–15.0)
LYMPHS ABS: 0.7 10*3/uL (ref 0.7–4.0)
Lymphocytes Relative: 7 %
MCH: 29.8 pg (ref 26.0–34.0)
MCHC: 35.8 g/dL (ref 30.0–36.0)
MCV: 83.3 fL (ref 78.0–100.0)
MONO ABS: 1.1 10*3/uL — AB (ref 0.1–1.0)
Monocytes Relative: 11 %
NEUTROS PCT: 82 %
Neutro Abs: 8.4 10*3/uL — ABNORMAL HIGH (ref 1.7–7.7)
PLATELETS: ADEQUATE 10*3/uL (ref 150–400)
RBC: 5.03 MIL/uL (ref 3.87–5.11)
RDW: 12.6 % (ref 11.5–15.5)
WBC: 10.2 10*3/uL (ref 4.0–10.5)

## 2015-10-22 LAB — URINE MICROSCOPIC-ADD ON

## 2015-10-22 LAB — LIPASE, BLOOD: Lipase: 135 U/L — ABNORMAL HIGH (ref 11–51)

## 2015-10-22 SURGERY — ERCP, WITH INTERVENTION IF INDICATED
Anesthesia: General

## 2015-10-22 MED ORDER — TOBRAMYCIN 0.3 % OP SOLN
1.0000 [drp] | Freq: Four times a day (QID) | OPHTHALMIC | Status: DC | PRN
  Filled 2015-10-22: qty 5

## 2015-10-22 MED ORDER — ONDANSETRON HCL 4 MG/2ML IJ SOLN
INTRAMUSCULAR | Status: AC
  Filled 2015-10-22: qty 2

## 2015-10-22 MED ORDER — BISMUTH SUBSALICYLATE 262 MG/15ML PO SUSP
30.0000 mL | ORAL | Status: DC | PRN
  Filled 2015-10-22: qty 236

## 2015-10-22 MED ORDER — TRAZODONE HCL 50 MG PO TABS
200.0000 mg | ORAL_TABLET | Freq: Every day | ORAL | Status: DC
  Administered 2015-10-22 – 2015-10-28 (×7): 200 mg via ORAL
  Filled 2015-10-22 (×7): qty 4

## 2015-10-22 MED ORDER — MEPERIDINE HCL 100 MG/ML IJ SOLN: 6.2500 mg | INTRAMUSCULAR | Status: DC | PRN

## 2015-10-22 MED ORDER — POTASSIUM CHLORIDE 10 MEQ/100ML IV SOLN
10.0000 meq | INTRAVENOUS | Status: AC
Start: 2015-10-22 — End: 2015-10-22
  Administered 2015-10-22 – 2015-10-23 (×3): 10 meq via INTRAVENOUS
  Filled 2015-10-22 (×4): qty 100

## 2015-10-22 MED ORDER — PROSIGHT PO TABS
1.0000 | ORAL_TABLET | Freq: Two times a day (BID) | ORAL | Status: DC
  Administered 2015-10-22 – 2015-10-29 (×13): 1 via ORAL
  Filled 2015-10-22 (×15): qty 1

## 2015-10-22 MED ORDER — ACETAMINOPHEN 650 MG RE SUPP
650.0000 mg | Freq: Four times a day (QID) | RECTAL | Status: DC | PRN
  Administered 2015-10-22: 650 mg via RECTAL
  Filled 2015-10-22: qty 1

## 2015-10-22 MED ORDER — PROPOFOL 10 MG/ML IV BOLUS
INTRAVENOUS | Status: AC
  Filled 2015-10-22: qty 20

## 2015-10-22 MED ORDER — ONDANSETRON HCL 4 MG/2ML IJ SOLN
4.0000 mg | Freq: Four times a day (QID) | INTRAMUSCULAR | Status: DC | PRN
  Administered 2015-10-22 – 2015-10-27 (×3): 4 mg via INTRAVENOUS
  Filled 2015-10-22 (×3): qty 2

## 2015-10-22 MED ORDER — METHOCARBAMOL 500 MG PO TABS
1000.0000 mg | ORAL_TABLET | Freq: Three times a day (TID) | ORAL | Status: DC
  Administered 2015-10-22 – 2015-10-29 (×20): 1000 mg via ORAL
  Filled 2015-10-22 (×20): qty 2

## 2015-10-22 MED ORDER — PROPOFOL 10 MG/ML IV BOLUS
INTRAVENOUS | Status: DC | PRN
  Administered 2015-10-22: 120 mg via INTRAVENOUS

## 2015-10-22 MED ORDER — ONDANSETRON HCL 4 MG PO TABS: 4.0000 mg | ORAL_TABLET | Freq: Four times a day (QID) | ORAL | Status: DC | PRN

## 2015-10-22 MED ORDER — ONDANSETRON HCL 4 MG/2ML IJ SOLN
INTRAMUSCULAR | Status: DC | PRN
  Administered 2015-10-22: 4 mg via INTRAVENOUS

## 2015-10-22 MED ORDER — IOPAMIDOL (ISOVUE-300) INJECTION 61%
30.0000 mL | Freq: Once | INTRAVENOUS | Status: AC | PRN
  Administered 2015-10-22: 30 mL via ORAL

## 2015-10-22 MED ORDER — PANTOPRAZOLE SODIUM 40 MG PO TBEC
40.0000 mg | DELAYED_RELEASE_TABLET | Freq: Two times a day (BID) | ORAL | Status: DC
  Administered 2015-10-22 – 2015-10-29 (×14): 40 mg via ORAL
  Filled 2015-10-22 (×14): qty 1

## 2015-10-22 MED ORDER — BIOTENE DRY MOUTH MT LIQD
15.0000 mL | Freq: Three times a day (TID) | OROMUCOSAL | Status: DC
  Administered 2015-10-22 – 2015-10-29 (×20): 15 mL via OROMUCOSAL

## 2015-10-22 MED ORDER — ALUM HYDROXIDE-MAG CARBONATE 95-358 MG/15ML PO SUSP
10.0000 mL | Freq: Four times a day (QID) | ORAL | Status: DC | PRN
  Filled 2015-10-22 (×2): qty 10

## 2015-10-22 MED ORDER — INDOMETHACIN 50 MG RE SUPP
RECTAL | Status: DC | PRN
  Administered 2015-10-22: 100 mg via RECTAL

## 2015-10-22 MED ORDER — CALCIUM CARBONATE-VITAMIN D 500-200 MG-UNIT PO TABS
1.0000 | ORAL_TABLET | Freq: Every day | ORAL | Status: DC
  Administered 2015-10-23 – 2015-10-29 (×6): 1 via ORAL
  Filled 2015-10-22 (×7): qty 1

## 2015-10-22 MED ORDER — LACTATED RINGERS IV SOLN
INTRAVENOUS | Status: DC | PRN
  Administered 2015-10-22: 15:00:00 via INTRAVENOUS

## 2015-10-22 MED ORDER — MORPHINE SULFATE (PF) 2 MG/ML IV SOLN
2.0000 mg | Freq: Once | INTRAVENOUS | Status: AC
  Administered 2015-10-22: 2 mg via INTRAVENOUS
  Filled 2015-10-22: qty 1

## 2015-10-22 MED ORDER — BENZOCAINE-RESORCINOL 5-2 % VA CREA: 1.0000 "application " | TOPICAL_CREAM | VAGINAL | Status: DC | PRN

## 2015-10-22 MED ORDER — INDOMETHACIN 50 MG RE SUPP
RECTAL | Status: AC
  Filled 2015-10-22: qty 2

## 2015-10-22 MED ORDER — FLUTICASONE PROPIONATE 50 MCG/ACT NA SUSP
2.0000 | Freq: Every day | NASAL | Status: DC
  Administered 2015-10-23 – 2015-10-29 (×7): 2 via NASAL
  Filled 2015-10-22: qty 16

## 2015-10-22 MED ORDER — FENTANYL CITRATE (PF) 100 MCG/2ML IJ SOLN
INTRAMUSCULAR | Status: DC | PRN
  Administered 2015-10-22 (×2): 50 ug via INTRAVENOUS

## 2015-10-22 MED ORDER — ACETAMINOPHEN 325 MG PO TABS
650.0000 mg | ORAL_TABLET | Freq: Four times a day (QID) | ORAL | Status: DC | PRN
Start: 2015-10-22 — End: 2015-10-29

## 2015-10-22 MED ORDER — MORPHINE SULFATE (PF) 4 MG/ML IV SOLN
4.0000 mg | Freq: Once | INTRAVENOUS | Status: AC
  Administered 2015-10-22: 4 mg via INTRAVENOUS
  Filled 2015-10-22: qty 1

## 2015-10-22 MED ORDER — HYDROXYZINE PAMOATE 25 MG PO CAPS
25.0000 mg | ORAL_CAPSULE | Freq: Three times a day (TID) | ORAL | Status: DC | PRN
  Filled 2015-10-22: qty 1

## 2015-10-22 MED ORDER — ONDANSETRON HCL 4 MG/2ML IJ SOLN
4.0000 mg | Freq: Once | INTRAMUSCULAR | Status: AC
  Administered 2015-10-22: 4 mg via INTRAVENOUS
  Filled 2015-10-22: qty 2

## 2015-10-22 MED ORDER — FENTANYL CITRATE (PF) 100 MCG/2ML IJ SOLN
INTRAMUSCULAR | Status: AC
  Filled 2015-10-22: qty 2

## 2015-10-22 MED ORDER — FENTANYL CITRATE (PF) 100 MCG/2ML IJ SOLN
12.5000 ug | INTRAMUSCULAR | Status: DC | PRN
  Administered 2015-10-22 – 2015-10-23 (×3): 12.5 ug via INTRAVENOUS
  Filled 2015-10-22 (×3): qty 2

## 2015-10-22 MED ORDER — SUCCINYLCHOLINE CHLORIDE 200 MG/10ML IV SOSY
PREFILLED_SYRINGE | INTRAVENOUS | Status: DC | PRN
  Administered 2015-10-22: 100 mg via INTRAVENOUS

## 2015-10-22 MED ORDER — GLUCAGON HCL RDNA (DIAGNOSTIC) 1 MG IJ SOLR
INTRAMUSCULAR | Status: AC
  Filled 2015-10-22: qty 1

## 2015-10-22 MED ORDER — CIPROFLOXACIN IN D5W 400 MG/200ML IV SOLN
INTRAVENOUS | Status: AC
  Filled 2015-10-22: qty 200

## 2015-10-22 MED ORDER — DEXTROSE-NACL 5-0.9 % IV SOLN
INTRAVENOUS | Status: DC
  Administered 2015-10-22: 13:00:00 via INTRAVENOUS

## 2015-10-22 MED ORDER — LIP MEDEX EX OINT
TOPICAL_OINTMENT | CUTANEOUS | Status: DC | PRN
  Filled 2015-10-22: qty 7

## 2015-10-22 MED ORDER — LEVOTHYROXINE SODIUM 112 MCG PO TABS
112.0000 ug | ORAL_TABLET | Freq: Every day | ORAL | Status: DC
  Administered 2015-10-23 – 2015-10-29 (×7): 112 ug via ORAL
  Filled 2015-10-22 (×7): qty 1

## 2015-10-22 MED ORDER — POTASSIUM CHLORIDE 10 MEQ/100ML IV SOLN
10.0000 meq | INTRAVENOUS | Status: DC
  Filled 2015-10-22 (×4): qty 100

## 2015-10-22 MED ORDER — SODIUM CHLORIDE 0.9 % IV BOLUS (SEPSIS)
500.0000 mL | Freq: Once | INTRAVENOUS | Status: AC
  Administered 2015-10-22: 500 mL via INTRAVENOUS

## 2015-10-22 MED ORDER — POTASSIUM CHLORIDE 10 MEQ/100ML IV SOLN
10.0000 meq | INTRAVENOUS | Status: AC
  Administered 2015-10-22: 10 meq via INTRAVENOUS
  Filled 2015-10-22: qty 100

## 2015-10-22 MED ORDER — LIDOCAINE 2% (20 MG/ML) 5 ML SYRINGE
INTRAMUSCULAR | Status: DC | PRN
  Administered 2015-10-22: 60 mg via INTRAVENOUS

## 2015-10-22 MED ORDER — SODIUM CHLORIDE 0.9 % IV SOLN: INTRAVENOUS | Status: DC

## 2015-10-22 MED ORDER — DEXTROMETHORPHAN POLISTIREX ER 30 MG/5ML PO SUER
10.0000 mL | Freq: Two times a day (BID) | ORAL | Status: DC | PRN
  Filled 2015-10-22: qty 10

## 2015-10-22 MED ORDER — POLYVINYL ALCOHOL 1.4 % OP SOLN
1.0000 [drp] | OPHTHALMIC | Status: DC | PRN
  Filled 2015-10-22: qty 15

## 2015-10-22 MED ORDER — SODIUM CHLORIDE 0.9 % IV SOLN
3.0000 g | Freq: Once | INTRAVENOUS | Status: AC
  Administered 2015-10-22: 3 g via INTRAVENOUS
  Filled 2015-10-22: qty 3

## 2015-10-22 MED ORDER — TRAMADOL HCL 50 MG PO TABS
50.0000 mg | ORAL_TABLET | Freq: Four times a day (QID) | ORAL | Status: DC | PRN
  Administered 2015-10-22 – 2015-10-23 (×2): 50 mg via ORAL
  Filled 2015-10-22 (×2): qty 1

## 2015-10-22 MED ORDER — INDOMETHACIN 50 MG RE SUPP: 100.0000 mg | Freq: Once | RECTAL | Status: DC

## 2015-10-22 MED ORDER — IOPAMIDOL (ISOVUE-300) INJECTION 61%
100.0000 mL | Freq: Once | INTRAVENOUS | Status: AC | PRN
Start: 2015-10-22 — End: 2015-10-22
  Administered 2015-10-22: 100 mL via INTRAVENOUS

## 2015-10-22 MED ORDER — ENOXAPARIN SODIUM 40 MG/0.4ML ~~LOC~~ SOLN
40.0000 mg | SUBCUTANEOUS | Status: DC
  Administered 2015-10-22 – 2015-10-28 (×7): 40 mg via SUBCUTANEOUS
  Filled 2015-10-22 (×8): qty 0.4

## 2015-10-22 MED ORDER — LIDOCAINE 2% (20 MG/ML) 5 ML SYRINGE
INTRAMUSCULAR | Status: AC
Start: 2015-10-22 — End: 2015-10-22
  Filled 2015-10-22: qty 5

## 2015-10-22 MED ORDER — POTASSIUM CHLORIDE 10 MEQ/100ML IV SOLN
10.0000 meq | Freq: Once | INTRAVENOUS | Status: AC
  Administered 2015-10-22: 10 meq via INTRAVENOUS
  Filled 2015-10-22: qty 100

## 2015-10-22 NOTE — Transfer of Care (Signed)
Immediate Anesthesia Transfer of Care Note  Patient: Erika Conley  Procedure(s) Performed: Procedure(s): ENDOSCOPIC RETROGRADE CHOLANGIOPANCREATOGRAPHY (ERCP) (N/A)  Patient Location: PACU  Anesthesia Type:General  Level of Consciousness:  sedated, patient cooperative and responds to stimulation  Airway & Oxygen Therapy:Patient Spontanous Breathing and Patient connected to face mask oxgen  Post-op Assessment:  Report given to PACU RN and Post -op Vital signs reviewed and stable  Post vital signs:  Reviewed and stable  Last Vitals:  Vitals:   10/22/15 1315 10/22/15 1520  BP: (!) 136/51 (!) 158/41  Pulse: 77 73  Resp: 16 16  Temp: (!) 38 C [id number]    Complications: No apparent anesthesia complications

## 2015-10-22 NOTE — H&P (View-Only) (Signed)
Consultation  Referring Provider: Dr. Ralene Bathe     Primary Care Physician:  Leonard Downing, MD Primary Gastroenterologist: None      Reason for Consultation:  Elevated LFT's, Abnormal CT, Abdominal pain, Diarrhea          HPI:   Erika Conley is a 81 y.o. female with a past medical history significant for anxiety, arthritis, depression, GERD and hypertension who presented to the ER this morning with a complaint of abdominal pain and diarrhea.  Today, the patient describes that 2 days ago she started with severe diarrhea noting that she "went to the bathroom all day and all night". This continued into the next day and night and around 2:00 this morning she woke with a severe abdominal pain which felt like "her abdomen was on fire". At that time her assisting living facility transferred her to the ER. Patient describes that she has not eaten anything since 4:00 on 10/20/15. She has had nausea over this time period which was somewhat relieved after antiemetics in the ER. Associated symptoms include chills and aching as well as some sweats, patient is unsure about fever as it was "never checked at the assisted living facility". She does tell me that her diarrhea has stopped since time of arrival to the ER.  Patient also has history of reflux for which she uses Gaviscon.  Patient denies vomiting, previous episodes of the same, use of anticoagulants or NSAIDs or history of melena or hematochezia.  ED course: CMP showing elevated alkaline phosphatase at 263, AST 471, ALT 375 and total bili increased at 8, potassium is low at 3.2; CT of the abdomen and pelvis with contrast showing concern for pancreatic neoplasm in the head of the pancreas measuring 2.6 x 1.9 x 1.8 cm. There is marked biliary duct dilation, likely due to compression from this apparent. There is in addition a subcentimeter cystic area in the tail of the pancreas. There is no surrounding peripancreatic mesenteric stranding or edema.  Liver is prominent with hepatic steatosis. 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 Patient has had fractures of T10-T11 and T12. Status post kyphoplasty procedures at T11 and T12.  Past GI history: 11/12/01: Dr. Vladimir Faster, colonoscopy: Assessment: Colon polyps; pathology: Adenomatous polyp with no high-grade dysplasia or invasive malignancy 11/12/01: Dr. Vladimir Faster, EGD: Assessment: Abnormal pylorus fixed in open position with abundant bile in the stomach; pathology: Reactive gastropathy  Past Medical History:  Diagnosis Date  . Anxiety   . Arthritis   . Depression   . Falls frequently   . Fracture, clavicle   . GERD (gastroesophageal reflux disease)   . History of fractured vertebra   . Hypertension   . Seasonal allergies   . Thyroid disease   . Vertigo   . Wrist fracture, right     Past Surgical History:  Procedure Laterality Date  . ABDOMINAL HYSTERECTOMY    . BACK SURGERY    . CHOLECYSTECTOMY    . FRACTURE SURGERY    . THYROIDECTOMY    . WRIST SURGERY      Family History  Problem Relation Age of Onset  . Hypertension Other     Social History  Substance Use Topics  . Smoking status: Never Smoker  . Smokeless tobacco: Never Used  . Alcohol use No    Prior to Admission medications   Medication Sig Start Date End Date Taking? Authorizing Provider  Alcaftadine (LASTACAFT) 0.25 % SOLN Place 1 drop  into both eyes at bedtime.   Yes Historical Provider, MD  aluminum hydroxide-magnesium carbonate (GAVISCON) 95-358 MG/15ML SUSP Take 10 mLs by mouth every 6 (six) hours as needed for indigestion.   Yes Historical Provider, MD  antiseptic oral rinse (BIOTENE) LIQD Swish and spit 15 mLs 3 (three) times daily.    Yes Historical Provider, MD  benzocaine-resorcinol (VAGISIL) 5-2 % vaginal cream Place 1 application vaginally as needed for itching.   Yes Historical Provider, MD  bismuth subsalicylate (PEPTO BISMOL) 262 MG/15ML suspension Take 30 mLs by  mouth every 4 (four) hours as needed for diarrhea or loose stools.   Yes Historical Provider, MD  calcium-vitamin D (OSCAL WITH D) 500-200 MG-UNIT tablet Take 1 tablet by mouth daily.   Yes Historical Provider, MD  cetirizine (ZYRTEC) 10 MG tablet Take 10 mg by mouth daily.   Yes Historical Provider, MD  chlorhexidine (PERIDEX) 0.12 % solution Swish and spit 15 mLs 2 (two) times daily.   Yes Historical Provider, MD  Dexlansoprazole 30 MG capsule Take 30 mg by mouth daily.   Yes Historical Provider, MD  dextromethorphan (DELSYM) 30 MG/5ML liquid Take 10 mLs by mouth every 12 (twelve) hours as needed for cough.   Yes Historical Provider, MD  fluticasone (FLONASE) 50 MCG/ACT nasal spray Place 2 sprays into both nostrils daily.   Yes Historical Provider, MD  hydrochlorothiazide (HYDRODIURIL) 25 MG tablet Take 25 mg by mouth daily.   Yes Historical Provider, MD  hydrocortisone cream 1 % Apply 1 application topically every 6 (six) hours as needed for itching.   Yes Historical Provider, MD  hydroxypropyl methylcellulose / hypromellose (ISOPTO TEARS / GONIOVISC) 2.5 % ophthalmic solution Place 1 drop into both eyes every 4 (four) hours as needed for dry eyes.    Yes Historical Provider, MD  hydrOXYzine (VISTARIL) 25 MG capsule Take 25 mg by mouth every 8 (eight) hours as needed for anxiety.   Yes Historical Provider, MD  Infant Care Products (JOHNSONS BABY SHAMPOO EX) Apply topically. Dilute a pea size amount with water; close right eye, wash and rinse. Repeat twice daily for 2 days prior to injection.   Yes Historical Provider, MD  levothyroxine (SYNTHROID, LEVOTHROID) 112 MCG tablet Take 112 mcg by mouth daily before breakfast.   Yes Historical Provider, MD  loperamide (IMODIUM) 2 MG capsule Take 2 mg by mouth daily as needed for diarrhea or loose stools. After each loose stool   Yes Historical Provider, MD  methocarbamol (ROBAXIN) 500 MG tablet Take 1,000 mg by mouth 3 (three) times daily.    Yes Historical  Provider, MD  Multiple Vitamins-Minerals (PRESERVISION AREDS) CAPS Take 1 capsule by mouth 2 (two) times daily.   Yes Historical Provider, MD  ondansetron (ZOFRAN) 8 MG tablet Take 8 mg by mouth every 8 (eight) hours as needed for nausea or vomiting.   Yes Historical Provider, MD  tobramycin (TOBREX) 0.3 % ophthalmic solution Place 1 drop into the right eye 4 (four) times daily as needed (the day prior, the day of, and the day after treatment.).    Yes Historical Provider, MD  traMADol (ULTRAM) 50 MG tablet Take 50 mg by mouth every 6 (six) hours as needed for moderate pain.   Yes Historical Provider, MD  traZODone (DESYREL) 100 MG tablet Take 200 mg by mouth at bedtime.    Yes Historical Provider, MD  triamcinolone cream (KENALOG) 0.1 % Apply 1 application topically every 6 (six) hours as needed (itching).   Yes Historical Provider,  MD  trolamine salicylate (ASPERCREME) 10 % cream Apply 1 application topically every 8 (eight) hours as needed for muscle pain.    Yes Historical Provider, MD  albuterol (PROVENTIL HFA;VENTOLIN HFA) 108 (90 BASE) MCG/ACT inhaler Inhale 2 puffs into the lungs every 4 (four) hours as needed for wheezing or shortness of breath (coough). Patient not taking: Reported on 10/22/2015 01/29/15   Gareth Morgan, MD  benzonatate (TESSALON) 100 MG capsule Take 1 capsule (100 mg total) by mouth 3 (three) times daily as needed for cough. Patient not taking: Reported on 10/22/2015 01/29/15   Gareth Morgan, MD  meclizine (ANTIVERT) 25 MG tablet Take 1 tablet (25 mg total) by mouth 3 (three) times daily as needed for dizziness. Patient not taking: Reported on 10/22/2015 04/12/15   Thurnell Lose, MD    No current facility-administered medications for this encounter.    Current Outpatient Prescriptions  Medication Sig Dispense Refill  . Alcaftadine (LASTACAFT) 0.25 % SOLN Place 1 drop into both eyes at bedtime.    Marland Kitchen aluminum hydroxide-magnesium carbonate (GAVISCON) 95-358 MG/15ML SUSP  Take 10 mLs by mouth every 6 (six) hours as needed for indigestion.    Marland Kitchen antiseptic oral rinse (BIOTENE) LIQD Swish and spit 15 mLs 3 (three) times daily.     . benzocaine-resorcinol (VAGISIL) 5-2 % vaginal cream Place 1 application vaginally as needed for itching.    . bismuth subsalicylate (PEPTO BISMOL) 262 MG/15ML suspension Take 30 mLs by mouth every 4 (four) hours as needed for diarrhea or loose stools.    . calcium-vitamin D (OSCAL WITH D) 500-200 MG-UNIT tablet Take 1 tablet by mouth daily.    . cetirizine (ZYRTEC) 10 MG tablet Take 10 mg by mouth daily.    . chlorhexidine (PERIDEX) 0.12 % solution Swish and spit 15 mLs 2 (two) times daily.    Marland Kitchen Dexlansoprazole 30 MG capsule Take 30 mg by mouth daily.    Marland Kitchen dextromethorphan (DELSYM) 30 MG/5ML liquid Take 10 mLs by mouth every 12 (twelve) hours as needed for cough.    . fluticasone (FLONASE) 50 MCG/ACT nasal spray Place 2 sprays into both nostrils daily.    . hydrochlorothiazide (HYDRODIURIL) 25 MG tablet Take 25 mg by mouth daily.    . hydrocortisone cream 1 % Apply 1 application topically every 6 (six) hours as needed for itching.    . hydroxypropyl methylcellulose / hypromellose (ISOPTO TEARS / GONIOVISC) 2.5 % ophthalmic solution Place 1 drop into both eyes every 4 (four) hours as needed for dry eyes.     . hydrOXYzine (VISTARIL) 25 MG capsule Take 25 mg by mouth every 8 (eight) hours as needed for anxiety.    Candace Gallus Care Products (JOHNSONS BABY SHAMPOO EX) Apply topically. Dilute a pea size amount with water; close right eye, wash and rinse. Repeat twice daily for 2 days prior to injection.    Marland Kitchen levothyroxine (SYNTHROID, LEVOTHROID) 112 MCG tablet Take 112 mcg by mouth daily before breakfast.    . loperamide (IMODIUM) 2 MG capsule Take 2 mg by mouth daily as needed for diarrhea or loose stools. After each loose stool    . methocarbamol (ROBAXIN) 500 MG tablet Take 1,000 mg by mouth 3 (three) times daily.     . Multiple  Vitamins-Minerals (PRESERVISION AREDS) CAPS Take 1 capsule by mouth 2 (two) times daily.    . ondansetron (ZOFRAN) 8 MG tablet Take 8 mg by mouth every 8 (eight) hours as needed for nausea or vomiting.    Marland Kitchen  tobramycin (TOBREX) 0.3 % ophthalmic solution Place 1 drop into the right eye 4 (four) times daily as needed (the day prior, the day of, and the day after treatment.).     Marland Kitchen traMADol (ULTRAM) 50 MG tablet Take 50 mg by mouth every 6 (six) hours as needed for moderate pain.    . traZODone (DESYREL) 100 MG tablet Take 200 mg by mouth at bedtime.     . triamcinolone cream (KENALOG) 0.1 % Apply 1 application topically every 6 (six) hours as needed (itching).    . trolamine salicylate (ASPERCREME) 10 % cream Apply 1 application topically every 8 (eight) hours as needed for muscle pain.     Marland Kitchen albuterol (PROVENTIL HFA;VENTOLIN HFA) 108 (90 BASE) MCG/ACT inhaler Inhale 2 puffs into the lungs every 4 (four) hours as needed for wheezing or shortness of breath (coough). (Patient not taking: Reported on 10/22/2015) 3.7 g 0  . benzonatate (TESSALON) 100 MG capsule Take 1 capsule (100 mg total) by mouth 3 (three) times daily as needed for cough. (Patient not taking: Reported on 10/22/2015) 21 capsule 0  . meclizine (ANTIVERT) 25 MG tablet Take 1 tablet (25 mg total) by mouth 3 (three) times daily as needed for dizziness. (Patient not taking: Reported on 10/22/2015) 30 tablet 0    Allergies as of 10/22/2015 - Review Complete 10/22/2015  Allergen Reaction Noted  . Codeine  07/20/2010  . Cortisone  07/20/2010  . Other  02/16/2013     Review of Systems:    Constitutional: Positive for fatigue, chills and sweats No weight loss HEENT: Eyes: No change in vision or yellow sclerae               Ears, Nose, Throat:  No change in hearing or congestion Skin: No rash or itching Cardiovascular: No chest pain, chest pressure or palpitations    Respiratory: No SOB or cough Gastrointestinal: See HPI and otherwise  negative Genitourinary: No dysuria or change in urinary frequency Neurological: Positive for headache No dizziness or syncope Musculoskeletal: Positive for chronic back pain No new muscle or joint pain Hematologic: No bleeding or bruising Psychiatric: Positive for historyof anxiety and depression    Physical Exam:  Vital signs in last 24 hours: Temp:  [98.3 F (36.8 C)-98.9 F (37.2 C)] 98.3 F (36.8 C) (09/01 0900) Pulse Rate:  [66-73] 68 (09/01 0900) Resp:  [13-19] 15 (09/01 0900) BP: (120-128)/(52-77) 128/69 (09/01 0900) SpO2:  [84 %-100 %] 98 % (09/01 0900) Weight:  [150 lb (68 kg)] 150 lb (68 kg) (09/01 0334)   General:   Pleasant Elderly Caucasian female appears to be in NAD, Well developed, Well nourished, alert and cooperative Head:  Normocephalic and atraumatic. Eyes:   PEERL, EOMI. No icterus. Conjunctiva pink. Ears:  Normal auditory acuity. Neck:  Supple Throat: Oral cavity and pharynx without inflammation, swelling or lesion. edentulous Lungs: Respirations even and unlabored. Lungs clear to auscultation bilaterally.   No wheezes, crackles, or rhonchi.  Heart: Normal S1, S2. No MRG. Regular rate and rhythm. No peripheral edema, cyanosis or pallor.  Abdomen:  Soft, nondistended, generalized tenderness to palpation, mild, patient describes somewhat worse in the left lower quadrant. No rebound or guarding. Normal bowel sounds. No appreciable masses or hepatomegaly. Rectal:  Not performed.  Msk:  Symmetrical without gross deformities. Extremities:  Without edema, no deformity or joint abnormality.  Neurologic:  Alert and  oriented x4;  grossly normal neurologically. Skin:   Dry and intact without significant lesions or rashes. Psychiatric:  Oriented to person, place and time. Demonstrates good judgement and reason without abnormal affect or behaviors.   LAB RESULTS:  Recent Labs  10/22/15 0543  WBC 10.2  HGB 15.0  HCT 41.9  PLT PLATELET CLUMPS NOTED ON SMEAR, COUNT  APPEARS ADEQUATE   BMET  Recent Labs  10/22/15 0543  NA 133*  K 3.2*  CL 95*  CO2 29  GLUCOSE 126*  BUN 11  CREATININE 0.64  CALCIUM 9.2   LFT  Recent Labs  10/22/15 0543  PROT 6.9  ALBUMIN 4.2  AST 471*  ALT 375*  ALKPHOS 263*  BILITOT 8.0*   PT/INR No results for input(s): LABPROT, INR in the last 72 hours.  STUDIES: Ct Abdomen Pelvis W Contrast  Result Date: 10/22/2015 CLINICAL DATA:  Abdominal pain with elevated white blood cell count, elevated lipase, and nausea/vomiting EXAM: CT ABDOMEN AND PELVIS WITH CONTRAST TECHNIQUE: Multidetector CT imaging of the abdomen and pelvis was performed using the standard protocol following bolus administration of intravenous contrast. Oral contrast was also administered. CONTRAST:  156m ISOVUE-300 IOPAMIDOL (ISOVUE-300) INJECTION 61% COMPARISON:  None. FINDINGS: Lower chest:  There is patchy bibasilar atelectatic change. Hepatobiliary: The liver measures 17.5 cm in length. There is hepatic steatosis. No focal liver mass is seen. However, there is marked intrahepatic biliary duct dilatation. Gallbladder is absent. The common bile duct is dilated to the level of the ampulla. There is mild dilatation of the adjacent pancreatic duct in this area. There is an area of decreased attenuation in the pancreatic head measuring 2.6 x 1.9 x 1.8 cm, best appreciable on axial slice 27 series 2 and coronal slice 46 series 4. There is no peripancreatic mesenteric stranding or thickening. There is a cystic area in the tail of the pancreas measuring 8 x 7 x 8 mm, best seen on axial slice 22 series 2 and coronal slice 57 series 4. No other pancreatic lesions are evident. Pancreas: No splenic lesions are evident. Spleen: Adrenals appear normal bilaterally. Adrenals/Urinary Tract: Kidneys bilaterally show no mass or hydronephrosis on either side. There is no renal or ureteral calculus on either side. Urinary bladder is midline with wall thickness within normal  limits. Stomach/Bowel: There is no appreciable bowel wall or mesenteric thickening. There are scattered colonic diverticula without diverticulitis. There is no bowel obstruction. No free air or portal venous air. There is lipomatous infiltration of the ileocecal valve. Vascular/Lymphatic: There is atherosclerotic calcification in the aorta but no aneurysm appreciable. Major mesenteric vessels appear patent. There is no evident adenopathy in the abdomen or pelvis. Reproductive: Uterus is absent. There is no pelvic mass or pelvic fluid collection. Other: Appendix is not appreciable. There is no periappendiceal region inflammation. There is no abscess or ascites in the abdomen or pelvis. Musculoskeletal: The patient has had kyphoplasty procedures at T11 and T12. There is also marked collapse of the T10 vertebral body with increase in kyphosis in these regions. There is moderate spinal stenosis at T12 due to a degree of retropulsion of bone in this area. There are no blastic or lytic bone lesions. There is no intramuscular or abdominal wall lesion. IMPRESSION: **An incidental finding of potential clinical significance has been found. Concern for pancreatic neoplasm in the head of the pancreas measuring 2.6 x 1.9 x 1.8 cm. There is marked biliary duct dilatation, likely due to compression from this apparent mass. There is in addition a subcentimeter cystic area in the tail of the pancreas. Given these findings, MR correlation with particular attention  to the pancreas pre and post-contrast advised.**Note that there is no surrounding peripancreatic mesenteric stranding or edema as would be expected with pancreatitis. Pancreatitis is possible in this circumstance but felt to be less likely than neoplasm given the overall appearance. Liver is prominent with hepatic steatosis. No focal liver mass lesions are evident. No adenopathy evident. No bowel obstruction. No abscess. No periappendiceal region inflammation. Spinal  stenosis due to retropulsion of bone at that level of T12. Patient has had fractures of T10, T11, and T12. The patient has undergone kyphoplasty procedures at T11 and T12. There is localized kyphosis in the lower thoracic spine in the areas of these fractures. Electronically Signed   By: Lowella Grip III M.D.   On: 10/22/2015 07:40     PREVIOUS ENDOSCOPIES:            EGD/ [facility] see history of present illness   Impression / Plan:   Impression: 1. Abdominal Pain:Increased after 2 days of diarrhea; consider relation to new finding of pancreatic mass and biliary ductal obstruction 2. Diarrhea: For the past 2 days, none since time of arrival to the ED, consider relation to above 3. Abnormal CT abdomen: showing likely pancreatic head mass and biliary ductal dilation 4. Elevated LFT's: with bili at 8 and elevate Alk phos- likely obstructive picture 5. Nausea: related to above 6. Hypokalemia: will need repletion prior to procedure  Plan: 1. Pt needs ERCP. Timing/arrangement pending, trying for this afternoon around 3:00/3:30 with Dr. Ardis Hughs, if not will arrange asap 2. Agree with NPO  3. Per Dr. Silverio Decamp appreciate surgical consult in regards to pancreatic mass 4. Continue supportive measures 5. Ordered 2 runs 31mq K IV prior to pending procedure 6. Patient does request that her son being notified of her arrival to the ED as well as recent diagnoses and possible plan for procedure. He does live in the area. She provides his name and phone number: KNada [phone] I did call him and alert him to what was going on. He acknowledges and denies any questions. He does tell me that his wife is currently going through chemo as well, so he will likely not be able to come and support his mother. 7. Discussed above with Dr. NSilverio Decamp please await any further recs  Thank you for your kind consultation, we will continue to follow.  JLavone NianLemmon  10/22/2015, 9:25 AM Pager #:  [phone]

## 2015-10-22 NOTE — Consult Note (Signed)
Reason for Consult:  Pancreatic mass Referring Physician:  PCP:  Leonard Downing, MD  CC; Abdominal pain and diarrhea Erika Conley is an 81 y.o. female.  HPI: Patient lives at assisted living presents with abdominal pain and diarrhea. Symptoms started about 2 days before admission to the ED.  The pain is generalized upper abdomen and going into her chest. Nothing in particular makes the pain worse;  pain medicine here makes it better.  She says she has trouble eating;  the food at the Assisted living is not very good.  She thinks she has gained weight, and is swollen up.  She has not noticed a change in her urine or skin color.  She has been in assisted living for frequent falls for 3 years.  She has many entries in her chart showing issues with syncope and no clear etiology has ever been found.  She walks with a walker because of back pain, arthritis, and her falls/syncope issue.    Workup in the ED shows MAXIMUM TEMPERATURE 99.6 vital signs were stable. Admission labs shows a sodium of 133, potassium of 3.2. Alkaline phosphatase was 263. Lipase 135 AST of 471 ALT of 375. Total bilirubin was 8.0. WBC is 10.2 hemoglobin 15 hematocrit 41.9 platelets are clumped and account could not be obtained. Urinalysis was unremarkable aside from the large amount of bilirubin and the orange color of the urine. A CT scan was then obtained, this revealed some basilar atelectasis in both lungs. There was concern for pancreatic neoplasm in the head of the pancreas measuring 2.6 x 1.9 x 1.8 cm. Biliary ductal dilatation likely due to compression from the mass. There are some cystic changes in the tail of pancreas there is no surrounding peripancreatic mesenteric stranding or edema expected with pancreatitis. The overall impression is that this is a neoplasm. The liver is prominent with hepatic steatosis no focal liver masses were evident.  Past Medical History:  Diagnosis Date  . Anxiety   . Arthritis   .  Depression   . Falls frequently   . Fracture, clavicle   . GERD (gastroesophageal reflux disease)   . History of fractured vertebra   . Hypertension   . Seasonal allergies   . Thyroid disease   . Vertigo   . Wrist fracture, right     Past Surgical History:  Procedure Laterality Date  . ABDOMINAL HYSTERECTOMY    . BACK SURGERY    . CHOLECYSTECTOMY age 35 [facility] with [facility] Dr. Rise Patience  Not sure how long ago again maybe 25 years    . FRACTURE SURGERY    . THYROIDECTOMY    . WRIST SURGERY      Family History  Problem Relation Age of Onset  . Hypertension Other     Social History:  reports that she has never smoked. She has never used smokeless tobacco. She reports that she does not drink alcohol or use drugs. Tobacco:  None Drugs:  None Etoh:  Occasional    Allergies:  Allergies  Allergen Reactions  . Codeine     Pass out, fall  . Cortisone     Pass out, fall   . Other     All narcotics cause patient to Pass out, fall     Medications:  Prior to Admission:  Prescriptions Prior to Admission  Medication Sig Dispense Refill Last Dose  . Alcaftadine (LASTACAFT) 0.25 % SOLN Place 1 drop into both eyes at bedtime.   10/21/2015 at 0800  . aluminum  hydroxide-magnesium carbonate (GAVISCON) 95-358 MG/15ML SUSP Take 10 mLs by mouth every 6 (six) hours as needed for indigestion.   10/21/2015 at 1200  . antiseptic oral rinse (BIOTENE) LIQD Swish and spit 15 mLs 3 (three) times daily.    unk  . benzocaine-resorcinol (VAGISIL) 5-2 % vaginal cream Place 1 application vaginally as needed for itching.   unk  . bismuth subsalicylate (PEPTO BISMOL) 262 MG/15ML suspension Take 30 mLs by mouth every 4 (four) hours as needed for diarrhea or loose stools.   unknown at unknown  . calcium-vitamin D (OSCAL WITH D) 500-200 MG-UNIT tablet Take 1 tablet by mouth daily.   10/21/2015 at 0800  . cetirizine (ZYRTEC) 10 MG tablet Take 10 mg by mouth daily.   10/21/2015 at 2000  . chlorhexidine (PERIDEX)  0.12 % solution Swish and spit 15 mLs 2 (two) times daily.   10/21/2015 at 2000  . Dexlansoprazole 30 MG capsule Take 30 mg by mouth daily.   10/21/2015 at 0800  . dextromethorphan (DELSYM) 30 MG/5ML liquid Take 10 mLs by mouth every 12 (twelve) hours as needed for cough.   09/27/2015 at unknown  . fluticasone (FLONASE) 50 MCG/ACT nasal spray Place 2 sprays into both nostrils daily.   10/21/2015 at 0800  . hydrochlorothiazide (HYDRODIURIL) 25 MG tablet Take 25 mg by mouth daily.   10/21/2015 at 0800  . hydrocortisone cream 1 % Apply 1 application topically every 6 (six) hours as needed for itching.   unknown at unknown  . hydroxypropyl methylcellulose / hypromellose (ISOPTO TEARS / GONIOVISC) 2.5 % ophthalmic solution Place 1 drop into both eyes every 4 (four) hours as needed for dry eyes.    10/21/2015 at 2100  . hydrOXYzine (VISTARIL) 25 MG capsule Take 25 mg by mouth every 8 (eight) hours as needed for anxiety.   10/21/2015 at 2200  . Infant Care Products (JOHNSONS BABY SHAMPOO EX) Apply topically. Dilute a pea size amount with water; close right eye, wash and rinse. Repeat twice daily for 2 days prior to injection.   10/06/2015 at unknown  . levothyroxine (SYNTHROID, LEVOTHROID) 112 MCG tablet Take 112 mcg by mouth daily before breakfast.   10/21/2015 at 0800  . loperamide (IMODIUM) 2 MG capsule Take 2 mg by mouth daily as needed for diarrhea or loose stools. After each loose stool   unknown at unknown  . methocarbamol (ROBAXIN) 500 MG tablet Take 1,000 mg by mouth 3 (three) times daily.    10/21/2015 at 2200  . Multiple Vitamins-Minerals (PRESERVISION AREDS) CAPS Take 1 capsule by mouth 2 (two) times daily.   10/21/2015 at 1700  . ondansetron (ZOFRAN) 8 MG tablet Take 8 mg by mouth every 8 (eight) hours as needed for nausea or vomiting.   10/21/2015 at Unknown time  . tobramycin (TOBREX) 0.3 % ophthalmic solution Place 1 drop into the right eye 4 (four) times daily as needed (the day prior, the day of, and the  day after treatment.).    10/06/2015 at unknown  . traMADol (ULTRAM) 50 MG tablet Take 50 mg by mouth every 6 (six) hours as needed for moderate pain.   10/18/2015 at unknown  . traZODone (DESYREL) 100 MG tablet Take 200 mg by mouth at bedtime.    10/21/2015 at 2000  . triamcinolone cream (KENALOG) 0.1 % Apply 1 application topically every 6 (six) hours as needed (itching).   unknown at unknown  . trolamine salicylate (ASPERCREME) 10 % cream Apply 1 application topically every 8 (eight) hours  as needed for muscle pain.    unk  . albuterol (PROVENTIL HFA;VENTOLIN HFA) 108 (90 BASE) MCG/ACT inhaler Inhale 2 puffs into the lungs every 4 (four) hours as needed for wheezing or shortness of breath (coough). (Patient not taking: Reported on 10/22/2015) 3.7 g 0 Not Taking at Unknown time  . benzonatate (TESSALON) 100 MG capsule Take 1 capsule (100 mg total) by mouth 3 (three) times daily as needed for cough. (Patient not taking: Reported on 10/22/2015) 21 capsule 0 Not Taking at Unknown time  . meclizine (ANTIVERT) 25 MG tablet Take 1 tablet (25 mg total) by mouth 3 (three) times daily as needed for dizziness. (Patient not taking: Reported on 10/22/2015) 30 tablet 0 Not Taking at Unknown time    Results for orders placed or performed during the hospital encounter of 10/22/15 (from the past 48 hour(s))  CBC with Differential     Status: Abnormal   Collection Time: 10/22/15  5:43 AM  Result Value Ref Range   WBC 10.2 4.0 - 10.5 K/uL    Comment: WHITE COUNT CONFIRMED ON SMEAR   RBC 5.03 3.87 - 5.11 MIL/uL   Hemoglobin 15.0 12.0 - 15.0 g/dL   HCT 41.9 36.0 - 46.0 %   MCV 83.3 78.0 - 100.0 fL   MCH 29.8 26.0 - 34.0 pg   MCHC 35.8 30.0 - 36.0 g/dL   RDW 12.6 11.5 - 15.5 %   Platelets  150 - 400 K/uL    PLATELET CLUMPS NOTED ON SMEAR, COUNT APPEARS ADEQUATE   Neutrophils Relative % 82 %   Lymphocytes Relative 7 %   Monocytes Relative 11 %   Eosinophils Relative 0 %   Basophils Relative 0 %   Neutro Abs 8.4 (H)  1.7 - 7.7 K/uL   Lymphs Abs 0.7 0.7 - 4.0 K/uL   Monocytes Absolute 1.1 (H) 0.1 - 1.0 K/uL   Eosinophils Absolute 0.0 0.0 - 0.7 K/uL   Basophils Absolute 0.0 0.0 - 0.1 K/uL   Smear Review PLATELET CLUMPS NOTED ON SMEAR   Comprehensive metabolic panel     Status: Abnormal   Collection Time: 10/22/15  5:43 AM  Result Value Ref Range   Sodium 133 (L) 135 - 145 mmol/L   Potassium 3.2 (L) 3.5 - 5.1 mmol/L   Chloride 95 (L) 101 - 111 mmol/L   CO2 29 22 - 32 mmol/L   Glucose, Bld 126 (H) 65 - 99 mg/dL   BUN 11 6 - 20 mg/dL   Creatinine, Ser 0.64 0.44 - 1.00 mg/dL   Calcium 9.2 8.9 - 10.3 mg/dL   Total Protein 6.9 6.5 - 8.1 g/dL   Albumin 4.2 3.5 - 5.0 g/dL   AST 471 (H) 15 - 41 U/L   ALT 375 (H) 14 - 54 U/L   Alkaline Phosphatase 263 (H) 38 - 126 U/L   Total Bilirubin 8.0 (H) 0.3 - 1.2 mg/dL   GFR calc non Af Amer >60 >60 mL/min   GFR calc Af Amer >60 >60 mL/min    Comment: (NOTE) The eGFR has been calculated using the CKD EPI equation. This calculation has not been validated in all clinical situations. eGFR's persistently <60 mL/min signify possible Chronic Kidney Disease.    Anion gap 9 5 - 15  Lipase, blood     Status: Abnormal   Collection Time: 10/22/15  5:43 AM  Result Value Ref Range   Lipase 135 (H) 11 - 51 U/L  Urinalysis, Routine w reflex microscopic (not at  [facility])     Status: Abnormal   Collection Time: 10/22/15  6:11 AM  Result Value Ref Range   Color, Urine ORANGE (A) YELLOW    Comment: BIOCHEMICALS MAY BE AFFECTED BY COLOR   APPearance CLEAR CLEAR   Specific Gravity, Urine 1.009 1.005 - 1.030   pH 7.0 5.0 - 8.0   Glucose, UA NEGATIVE NEGATIVE mg/dL   Hgb urine dipstick NEGATIVE NEGATIVE   Bilirubin Urine LARGE (A) NEGATIVE   Ketones, ur 40 (A) NEGATIVE mg/dL   Protein, ur NEGATIVE NEGATIVE mg/dL   Nitrite POSITIVE (A) NEGATIVE   Leukocytes, UA SMALL (A) NEGATIVE  Urine microscopic-add on     Status: Abnormal   Collection Time: 10/22/15  6:11 AM  Result Value  Ref Range   Squamous Epithelial / LPF 0-5 (A) NONE SEEN   WBC, UA 0-5 0 - 5 WBC/hpf   RBC / HPF 0-5 0 - 5 RBC/hpf   Bacteria, UA FEW (A) NONE SEEN    Ct Abdomen Pelvis W Contrast  Result Date: 10/22/2015 CLINICAL DATA:  Abdominal pain with elevated white blood cell count, elevated lipase, and nausea/vomiting EXAM: CT ABDOMEN AND PELVIS WITH CONTRAST TECHNIQUE: Multidetector CT imaging of the abdomen and pelvis was performed using the standard protocol following bolus administration of intravenous contrast. Oral contrast was also administered. CONTRAST:  113m ISOVUE-300 IOPAMIDOL (ISOVUE-300) INJECTION 61% COMPARISON:  None. FINDINGS: Lower chest:  There is patchy bibasilar atelectatic change. Hepatobiliary: The liver measures 17.5 cm in length. There is hepatic steatosis. No focal liver mass is seen. However, there is marked intrahepatic biliary duct dilatation. Gallbladder is absent. The common bile duct is dilated to the level of the ampulla. There is mild dilatation of the adjacent pancreatic duct in this area. There is an area of decreased attenuation in the pancreatic head measuring 2.6 x 1.9 x 1.8 cm, best appreciable on axial slice 27 series 2 and coronal slice 46 series 4. There is no peripancreatic mesenteric stranding or thickening. There is a cystic area in the tail of the pancreas measuring 8 x 7 x 8 mm, best seen on axial slice 22 series 2 and coronal slice 57 series 4. No other pancreatic lesions are evident. Pancreas: No splenic lesions are evident. Spleen: Adrenals appear normal bilaterally. Adrenals/Urinary Tract: Kidneys bilaterally show no mass or hydronephrosis on either side. There is no renal or ureteral calculus on either side. Urinary bladder is midline with wall thickness within normal limits. Stomach/Bowel: There is no appreciable bowel wall or mesenteric thickening. There are scattered colonic diverticula without diverticulitis. There is no bowel obstruction. No free air or portal  venous air. There is lipomatous infiltration of the ileocecal valve. Vascular/Lymphatic: There is atherosclerotic calcification in the aorta but no aneurysm appreciable. Major mesenteric vessels appear patent. There is no evident adenopathy in the abdomen or pelvis. Reproductive: Uterus is absent. There is no pelvic mass or pelvic fluid collection. Other: Appendix is not appreciable. There is no periappendiceal region inflammation. There is no abscess or ascites in the abdomen or pelvis. Musculoskeletal: The patient has had kyphoplasty procedures at T11 and T12. There is also marked collapse of the T10 vertebral body with increase in kyphosis in these regions. There is moderate spinal stenosis at T12 due to a degree of retropulsion of bone in this area. There are no blastic or lytic bone lesions. There is no intramuscular or abdominal wall lesion. IMPRESSION: **An incidental finding of potential clinical significance has been found. Concern for pancreatic neoplasm  in the head of the pancreas measuring 2.6 x 1.9 x 1.8 cm. There is marked biliary duct dilatation, likely due to compression from this apparent mass. There is in addition a subcentimeter cystic area in the tail of the pancreas. Given these findings, MR correlation with particular attention to the pancreas pre and post-contrast advised.**Note that there is no surrounding peripancreatic mesenteric stranding or edema as would be expected with pancreatitis. Pancreatitis is possible in this circumstance but felt to be less likely than neoplasm given the overall appearance. Liver is prominent with hepatic steatosis. No focal liver mass lesions are evident. No adenopathy evident. No bowel obstruction. No abscess. No periappendiceal region inflammation. Spinal stenosis due to retropulsion of bone at that level of T12. Patient has had fractures of T10, T11, and T12. The patient has undergone kyphoplasty procedures at T11 and T12. There is localized kyphosis in the  lower thoracic spine in the areas of these fractures. Electronically Signed   By: Lowella Grip III M.D.   On: 10/22/2015 07:40    Review of Systems  Constitutional: Positive for chills (she may have chills, she has felt feverish,but not temps taken.). Negative for diaphoresis, fever, malaise/fatigue and weight loss (she thinks she is swollen up and has gained weight.  ).  HENT: Positive for hearing loss (right ear). Negative for congestion, ear discharge, ear pain, nosebleeds, sore throat and tinnitus.   Eyes: Positive for blurred vision.       She has macular degeneration and has some loss.  She notes improvement with injections.    Respiratory: Positive for cough (non productive cough for some months, perhaps a year.  ) and shortness of breath (DOE with walking). Negative for hemoptysis, sputum production, wheezing and stridor.   Cardiovascular: Positive for leg swelling (some swelling at times).  Gastrointestinal: Positive for abdominal pain (mid to upper abdomen and into the chest), diarrhea (2-3 days of diarrhea, better last 12 hours.), heartburn (long term issue for years) and nausea. Negative for blood in stool, constipation, melena and vomiting.  Genitourinary: Negative.   Musculoskeletal: Positive for back pain (takes robaxin for this) and joint pain (She take tramadol for this.  ).       Arthritis is in her back, knees, hands, and shoulders.    Skin: Negative.   Neurological: Positive for weakness (some but it is chronic). Negative for dizziness, tingling, tremors, sensory change, speech change, focal weakness, seizures and loss of consciousness.  Psychiatric/Behavioral: Positive for depression. The patient is nervous/anxious.    Blood pressure (!) 153/58, pulse 76, temperature 99.6 F (37.6 C), temperature source Oral, resp. rate 16, height 5' 1" (1.549 m), weight 67.8 kg (149 lb 7.6 oz), SpO2 97 %. Physical Exam  Constitutional: She is oriented to person, place, and time.   Elderly woman currently not having much discomfort.  No acute distress.  HENT:  Head: Normocephalic and atraumatic.  Nose: Nose normal.  Mouth/Throat: No oropharyngeal exudate.  Eyes: Conjunctivae and EOM are normal. Right eye exhibits no discharge. Left eye exhibits no discharge. No scleral icterus.  Neck: Normal range of motion. Neck supple. No JVD present. No tracheal deviation present. No thyromegaly present.  Cardiovascular: Normal rate, regular rhythm, normal heart sounds and intact distal pulses.   No murmur heard. Respiratory: Effort normal and breath sounds normal. No respiratory distress. She has no wheezes. She has no rales. She exhibits no tenderness.  GI: Soft. Bowel sounds are normal. She exhibits no distension and no mass. There is  no tenderness. There is no rebound and no guarding.  She says she feels sore all over.  No discrete sites of pain now.  She has a midline scar and a horizontal incision under her umbilicus  Musculoskeletal: She exhibits no edema or tenderness.  Lymphadenopathy:    She has no cervical adenopathy.  Neurological: She is alert and oriented to person, place, and time. No cranial nerve deficit.  Skin: Skin is warm and dry. No rash noted. No erythema. No pallor.  Psychiatric: She has a normal mood and affect. Her behavior is normal. Judgment and thought content normal.    Assessment/Plan: Pancreatic mass with abdominal pain and diarrhea Elevated LFT's, biliary dilitation  Hx of recurrent syncope/falls Chronic GERD Chronic back pain with spinal stenosis, and compression fractures Arthritis Anxiety/Depression Deconditioning - Walker bound secondary to arthritis, back pain and falls.  Plan:  Dr. Johney Maine has alerted Dr. Barry Dienes and we will follow with you as work up is completed.     , 10/22/2015, 12:41 PM

## 2015-10-22 NOTE — Anesthesia Preprocedure Evaluation (Signed)
Anesthesia Evaluation  Patient identified by MRN, date of birth, ID band Patient awake    Reviewed: Allergy & Precautions, H&P , Patient's Chart, lab work & pertinent test results, reviewed documented beta blocker date and time   Airway Mallampati: II  TM Distance: >3 FB Neck ROM: full    Dental no notable dental hx.    Pulmonary    Pulmonary exam normal breath sounds clear to auscultation       Cardiovascular hypertension,  Rhythm:regular Rate:Normal     Neuro/Psych    GI/Hepatic GERD  ,  Endo/Other    Renal/GU      Musculoskeletal   Abdominal   Peds  Hematology   Anesthesia Other Findings Good LV fxn; No AS  Reproductive/Obstetrics                             Anesthesia Physical Anesthesia Plan  ASA: II  Anesthesia Plan: General   Post-op Pain Management:    Induction: Intravenous  Airway Management Planned: Oral ETT  Additional Equipment:   Intra-op Plan:   Post-operative Plan: Extubation in OR  Informed Consent: I have reviewed the patients History and Physical, chart, labs and discussed the procedure including the risks, benefits and alternatives for the proposed anesthesia with the patient or authorized representative who has indicated his/her understanding and acceptance.   Dental Advisory Given and Dental advisory given  Plan Discussed with: CRNA and Surgeon  Anesthesia Plan Comments: (  Discussed general anesthesia, including possible nausea, instrumentation of airway, sore throat,pulmonary aspiration, etc. I asked if the were any outstanding questions, or  concerns before we proceeded. )        Anesthesia Quick Evaluation

## 2015-10-22 NOTE — ED Provider Notes (Signed)
Pt care assumed from Dr. Florina Ou.  Pt here with abdominal pain, diarrhea.  LFTs elevated in obstructive pattern, CT scan with possible pancreatic head mass and biliary ductal dilatation.  Pt updated of findings of studies and hospitalist consulted for admission.  [facility] consulted for biliary obstruction.     Quintella Reichert, MD 10/22/15 [phone]

## 2015-10-22 NOTE — H&P (Signed)
History and Physical    Erika Conley [id number] DOB: April 09, 1934 DOA: 10/22/2015  PCP: Leonard Downing, MD   Patient coming from: Home  Chief Complaint: Abdominal pain and diarrhea.   HPI: Erika Conley is a 81 y.o. female with medical history significant of acid reflux disease and fibromyalgia who presents to hospital with the chief complaint of abdominal pain and diarrhea. Her symptoms have been ongoing for last 3 days. Abdominal pain is epigastric in location, burning in nature, there is no improving or worsening factors, has been associated with decreased appetite and profuse diarrhea. Patient denies choluria or acholic stools. No sick contacts. She has had her bladder removed many years ago. Denies any weight loss, fever or chills.   ED Course:  Diagnostic CT with a pancreatic mass. Cholestatic pattern of liver function test.   Review of Systems:  1. General. No fever chills, no weight gain or weight loss 2. Cardiovascular. No angina, claudication, no PND or orthopnea 3. Pulmonary no shortness of breath cough or hemoptysis 4. Gastrointestinal positive for abdominal pain and diarrhea, no vomiting 5. Musculoskeletal no joint pain 6. Dermatology no rashes 7. Urology no dysuria or increased urinary frequency 8. Hematology no easy bruisability or frequent infections 9. Endocrine no tremors heat or cold intolerance 10. Neurology no seizures or paresthesias  Past Medical History:  Diagnosis Date  . Anxiety   . Arthritis   . Depression   . Falls frequently   . Fracture, clavicle   . GERD (gastroesophageal reflux disease)   . History of fractured vertebra   . Hypertension   . Seasonal allergies   . Thyroid disease   . Vertigo   . Wrist fracture, right     Past Surgical History:  Procedure Laterality Date  . ABDOMINAL HYSTERECTOMY    . BACK SURGERY    . CHOLECYSTECTOMY    . FRACTURE SURGERY    . THYROIDECTOMY    . WRIST SURGERY       reports that she has  never smoked. She has never used smokeless tobacco. She reports that she does not drink alcohol or use drugs.  Allergies  Allergen Reactions  . Codeine     Pass out, fall  . Cortisone     Pass out, fall   . Other     All narcotics cause patient to Pass out, fall     Family History  Problem Relation Age of Onset  . Hypertension Other      Prior to Admission medications   Medication Sig Start Date End Date Taking? Authorizing Provider  Alcaftadine (LASTACAFT) 0.25 % SOLN Place 1 drop into both eyes at bedtime.   Yes Historical Provider, MD  aluminum hydroxide-magnesium carbonate (GAVISCON) 95-358 MG/15ML SUSP Take 10 mLs by mouth every 6 (six) hours as needed for indigestion.   Yes Historical Provider, MD  antiseptic oral rinse (BIOTENE) LIQD Swish and spit 15 mLs 3 (three) times daily.    Yes Historical Provider, MD  benzocaine-resorcinol (VAGISIL) 5-2 % vaginal cream Place 1 application vaginally as needed for itching.   Yes Historical Provider, MD  bismuth subsalicylate (PEPTO BISMOL) 262 MG/15ML suspension Take 30 mLs by mouth every 4 (four) hours as needed for diarrhea or loose stools.   Yes Historical Provider, MD  calcium-vitamin D (OSCAL WITH D) 500-200 MG-UNIT tablet Take 1 tablet by mouth daily.   Yes Historical Provider, MD  cetirizine (ZYRTEC) 10 MG tablet Take 10 mg by mouth daily.   Yes Historical Provider,  MD  chlorhexidine (PERIDEX) 0.12 % solution Swish and spit 15 mLs 2 (two) times daily.   Yes Historical Provider, MD  Dexlansoprazole 30 MG capsule Take 30 mg by mouth daily.   Yes Historical Provider, MD  dextromethorphan (DELSYM) 30 MG/5ML liquid Take 10 mLs by mouth every 12 (twelve) hours as needed for cough.   Yes Historical Provider, MD  fluticasone (FLONASE) 50 MCG/ACT nasal spray Place 2 sprays into both nostrils daily.   Yes Historical Provider, MD  hydrochlorothiazide (HYDRODIURIL) 25 MG tablet Take 25 mg by mouth daily.   Yes Historical Provider, MD    hydrocortisone cream 1 % Apply 1 application topically every 6 (six) hours as needed for itching.   Yes Historical Provider, MD  hydroxypropyl methylcellulose / hypromellose (ISOPTO TEARS / GONIOVISC) 2.5 % ophthalmic solution Place 1 drop into both eyes every 4 (four) hours as needed for dry eyes.    Yes Historical Provider, MD  hydrOXYzine (VISTARIL) 25 MG capsule Take 25 mg by mouth every 8 (eight) hours as needed for anxiety.   Yes Historical Provider, MD  Infant Care Products (JOHNSONS BABY SHAMPOO EX) Apply topically. Dilute a pea size amount with water; close right eye, wash and rinse. Repeat twice daily for 2 days prior to injection.   Yes Historical Provider, MD  levothyroxine (SYNTHROID, LEVOTHROID) 112 MCG tablet Take 112 mcg by mouth daily before breakfast.   Yes Historical Provider, MD  loperamide (IMODIUM) 2 MG capsule Take 2 mg by mouth daily as needed for diarrhea or loose stools. After each loose stool   Yes Historical Provider, MD  methocarbamol (ROBAXIN) 500 MG tablet Take 1,000 mg by mouth 3 (three) times daily.    Yes Historical Provider, MD  Multiple Vitamins-Minerals (PRESERVISION AREDS) CAPS Take 1 capsule by mouth 2 (two) times daily.   Yes Historical Provider, MD  ondansetron (ZOFRAN) 8 MG tablet Take 8 mg by mouth every 8 (eight) hours as needed for nausea or vomiting.   Yes Historical Provider, MD  tobramycin (TOBREX) 0.3 % ophthalmic solution Place 1 drop into the right eye 4 (four) times daily as needed (the day prior, the day of, and the day after treatment.).    Yes Historical Provider, MD  traMADol (ULTRAM) 50 MG tablet Take 50 mg by mouth every 6 (six) hours as needed for moderate pain.   Yes Historical Provider, MD  traZODone (DESYREL) 100 MG tablet Take 200 mg by mouth at bedtime.    Yes Historical Provider, MD  triamcinolone cream (KENALOG) 0.1 % Apply 1 application topically every 6 (six) hours as needed (itching).   Yes Historical Provider, MD  trolamine  salicylate (ASPERCREME) 10 % cream Apply 1 application topically every 8 (eight) hours as needed for muscle pain.    Yes Historical Provider, MD  albuterol (PROVENTIL HFA;VENTOLIN HFA) 108 (90 BASE) MCG/ACT inhaler Inhale 2 puffs into the lungs every 4 (four) hours as needed for wheezing or shortness of breath (coough). Patient not taking: Reported on 10/22/2015 01/29/15   Gareth Morgan, MD  benzonatate (TESSALON) 100 MG capsule Take 1 capsule (100 mg total) by mouth 3 (three) times daily as needed for cough. Patient not taking: Reported on 10/22/2015 01/29/15   Gareth Morgan, MD  meclizine (ANTIVERT) 25 MG tablet Take 1 tablet (25 mg total) by mouth 3 (three) times daily as needed for dizziness. Patient not taking: Reported on 10/22/2015 04/12/15   Thurnell Lose, MD    Physical Exam: Vitals:   10/22/15 0801  10/22/15 0803 10/22/15 0900 10/22/15 1001  BP: 123/77 123/77 128/69 142/62  Pulse: 73 67 68 65  Resp: 19 16 15 14   Temp: 98.9 F (37.2 C)  98.3 F (36.8 C) 99.7 F (37.6 C)  TempSrc: Oral  Oral Oral  SpO2: 100% 97% 98% 96%  Weight:      Height:          Constitutional: U ill-looking appearing, deconditioned Vitals:   10/22/15 0801 10/22/15 0803 10/22/15 0900 10/22/15 1001  BP: 123/77 123/77 128/69 142/62  Pulse: 73 67 68 65  Resp: 19 16 15 14   Temp: 98.9 F (37.2 C)  98.3 F (36.8 C) 99.7 F (37.6 C)  TempSrc: Oral  Oral Oral  SpO2: 100% 97% 98% 96%  Weight:      Height:       Eyes: PERRL, lids and conjunctivae with no pallor or icterus ENMT: Mucous membranes are dry. Posterior pharynx clear of any exudate or lesions.Normal dentition.  Neck: normal, supple, no masses, no thyromegaly Respiratory: clear to auscultation bilaterally, no wheezing, no crackles. Normal respiratory effort. No accessory muscle use.  Cardiovascular: Regular rate and rhythm, no murmurs / rubs / gallops. No extremity edema. 2+ pedal pulses. No carotid bruits.  Abdomen: Mild tenderness to deep  palpation in the epigastric area, no masses palpated. No hepatosplenomegaly. Bowel sounds positive. No peritoneal signs, no rebound or guarding Musculoskeletal: no clubbing / cyanosis. No joint deformity upper and lower extremities. Good ROM, no contractures. Normal muscle tone.  Skin: no rashes, lesions, ulcers. No induration Neurologic: CN 2-12 grossly intact. Sensation intact, DTR normal. Strength 5/5 in all 4.  Psychiatric: Normal judgment and insight. Alert and oriented x 3. Normal mood.    Labs on Admission: I have personally reviewed following labs and imaging studies  CBC:  Recent Labs Lab 10/22/15 0543  WBC 10.2  NEUTROABS 8.4*  HGB 15.0  HCT 41.9  MCV 83.3  PLT PLATELET CLUMPS NOTED ON SMEAR, COUNT APPEARS ADEQUATE   Basic Metabolic Panel:  Recent Labs Lab 10/22/15 0543  NA 133*  K 3.2*  CL 95*  CO2 29  GLUCOSE 126*  BUN 11  CREATININE 0.64  CALCIUM 9.2   GFR: Estimated Creatinine Clearance: 49.9 mL/min (by C-G formula based on SCr of 0.8 mg/dL). Liver Function Tests:  Recent Labs Lab 10/22/15 0543  AST 471*  ALT 375*  ALKPHOS 263*  BILITOT 8.0*  PROT 6.9  ALBUMIN 4.2    Recent Labs Lab 10/22/15 0543  LIPASE 135*   No results for input(s): AMMONIA in the last 168 hours. Coagulation Profile: No results for input(s): INR, PROTIME in the last 168 hours. Cardiac Enzymes: No results for input(s): CKTOTAL, CKMB, CKMBINDEX, TROPONINI in the last 168 hours. BNP (last 3 results) No results for input(s): PROBNP in the last 8760 hours. HbA1C: No results for input(s): HGBA1C in the last 72 hours. CBG: No results for input(s): GLUCAP in the last 168 hours. Lipid Profile: No results for input(s): CHOL, HDL, LDLCALC, TRIG, CHOLHDL, LDLDIRECT in the last 72 hours. Thyroid Function Tests: No results for input(s): TSH, T4TOTAL, FREET4, T3FREE, THYROIDAB in the last 72 hours. Anemia Panel: No results for input(s): VITAMINB12, FOLATE, FERRITIN, TIBC, IRON,  RETICCTPCT in the last 72 hours. Urine analysis:    Component Value Date/Time   COLORURINE ORANGE (A) 10/22/2015 0611   APPEARANCEUR CLEAR 10/22/2015 0611   LABSPEC 1.009 10/22/2015 0611   PHURINE 7.0 10/22/2015 0611   GLUCOSEU NEGATIVE 10/22/2015 [id number]  HGBUR NEGATIVE 10/22/2015 0611   BILIRUBINUR LARGE (A) 10/22/2015 0611   KETONESUR 40 (A) 10/22/2015 0611   PROTEINUR NEGATIVE 10/22/2015 0611   UROBILINOGEN 0.2 11/21/2014 0501   NITRITE POSITIVE (A) 10/22/2015 0611   LEUKOCYTESUR SMALL (A) 10/22/2015 0611   Sepsis Labs: !!!!!!!!!!!!!!!!!!!!!!!!!!!!!!!!!!!!!!!!!!!! @LABRCNTIP (procalcitonin:4,lacticidven:4) )No results found for this or any previous visit (from the past 240 hour(s)).   Radiological Exams on Admission: Ct Abdomen Pelvis W Contrast  Result Date: 10/22/2015 CLINICAL DATA:  Abdominal pain with elevated white blood cell count, elevated lipase, and nausea/vomiting EXAM: CT ABDOMEN AND PELVIS WITH CONTRAST TECHNIQUE: Multidetector CT imaging of the abdomen and pelvis was performed using the standard protocol following bolus administration of intravenous contrast. Oral contrast was also administered. CONTRAST:  135mL ISOVUE-300 IOPAMIDOL (ISOVUE-300) INJECTION 61% COMPARISON:  None. FINDINGS: Lower chest:  There is patchy bibasilar atelectatic change. Hepatobiliary: The liver measures 17.5 cm in length. There is hepatic steatosis. No focal liver mass is seen. However, there is marked intrahepatic biliary duct dilatation. Gallbladder is absent. The common bile duct is dilated to the level of the ampulla. There is mild dilatation of the adjacent pancreatic duct in this area. There is an area of decreased attenuation in the pancreatic head measuring 2.6 x 1.9 x 1.8 cm, best appreciable on axial slice 27 series 2 and coronal slice 46 series 4. There is no peripancreatic mesenteric stranding or thickening. There is a cystic area in the tail of the pancreas measuring 8 x 7 x 8 mm, best seen  on axial slice 22 series 2 and coronal slice 57 series 4. No other pancreatic lesions are evident. Pancreas: No splenic lesions are evident. Spleen: Adrenals appear normal bilaterally. Adrenals/Urinary Tract: Kidneys bilaterally show no mass or hydronephrosis on either side. There is no renal or ureteral calculus on either side. Urinary bladder is midline with wall thickness within normal limits. Stomach/Bowel: There is no appreciable bowel wall or mesenteric thickening. There are scattered colonic diverticula without diverticulitis. There is no bowel obstruction. No free air or portal venous air. There is lipomatous infiltration of the ileocecal valve. Vascular/Lymphatic: There is atherosclerotic calcification in the aorta but no aneurysm appreciable. Major mesenteric vessels appear patent. There is no evident adenopathy in the abdomen or pelvis. Reproductive: Uterus is absent. There is no pelvic mass or pelvic fluid collection. Other: Appendix is not appreciable. There is no periappendiceal region inflammation. There is no abscess or ascites in the abdomen or pelvis. Musculoskeletal: The patient has had kyphoplasty procedures at T11 and T12. There is also marked collapse of the T10 vertebral body with increase in kyphosis in these regions. There is moderate spinal stenosis at T12 due to a degree of retropulsion of bone in this area. There are no blastic or lytic bone lesions. There is no intramuscular or abdominal wall lesion. IMPRESSION: **An incidental finding of potential clinical significance has been found. Concern for pancreatic neoplasm in the head of the pancreas measuring 2.6 x 1.9 x 1.8 cm. There is marked biliary duct dilatation, likely due to compression from this apparent mass. There is in addition a subcentimeter cystic area in the tail of the pancreas. Given these findings, MR correlation with particular attention to the pancreas pre and post-contrast advised.**Note that there is no surrounding  peripancreatic mesenteric stranding or edema as would be expected with pancreatitis. Pancreatitis is possible in this circumstance but felt to be less likely than neoplasm given the overall appearance. Liver is prominent with hepatic steatosis. No focal liver mass  lesions are evident. No adenopathy evident. No bowel obstruction. No abscess. No periappendiceal region inflammation. Spinal stenosis due to retropulsion of bone at that level of T12. Patient has had fractures of T10, T11, and T12. The patient has undergone kyphoplasty procedures at T11 and T12. There is localized kyphosis in the lower thoracic spine in the areas of these fractures. Electronically Signed   By: Lowella Grip III M.D.   On: 10/22/2015 07:40    Assessment/Plan Active Problems:   Abdominal pain   Pancreatic mass   This is a 81 year old female who presents to the hospital with worsening abdominal pain and diarrhea for the last 3 days. On the initial physical examination she looks dehydrated. Her temperature is 99.7, blood pressure 142/62, heart 65, respiratory rate 14, oxygen saturation 96% on 2 L nasal cannula. Her serum sodium is 133, potassium 3.2, creatinine 0.64, BUN 11, glucose 126, alkaline phosphatase 263, lipase 135, AST 471, ALT 375, total protein 6.9, white count 10.2, hemoglobin 15.0 hematocrit 41.9, platelet count (clumped). Urinalysis with large bilirubin, orange in color, white cells 0-5. CT of the abdomen suggesting pancreatic neoplasm 2.6 x 1.9, x 1.8 cm, with marked biliary duct dilatation. No surrounding peripancreatic mesenteric stranding or edema.   Working diagnosis. 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   1. Pancreatic mass. Obstructive process, will continue supportive care with IV fluids, antiemetics and antiacids. Patient probably will need ERCP for a possible stent placement, will follow-up with  gastroenterology  recommendations. Will keep patient nothing by mouth.   2. Hypokalemia. Patient has received 10 mEq in the emergency department, will add 40 mEq more, follow-up kidney function and electrolytes in the morning, continue hydration with dextrose and isotonic saline at 75 ML per hour. Avoid hypotension or nephrotoxic agents.  3. Hypothyroidism. Continue levothyroxine, per patient's home regimen.  4. Dyspepsia. Continue antiacid therapy, for now patient will be kept nothing by mouth.   5. Depression. Continue trazodone  Patient continued at high risk of developing worsening dehydration, abdominal pain.   DVT prophylaxis: lovenox Code Status: DNR Family Communication: No family at bedside. Disposition Plan: Home Consults called: Gastroenterology Admission status: Inpatient   Karrah Mangini Gerome Apley MD [facility] Pager [phone]  If 7PM-7AM, please contact night-coverage [email or web address] Password [email or web address]  10/22/2015, 11:02 AM

## 2015-10-22 NOTE — Anesthesia Postprocedure Evaluation (Signed)
Anesthesia Post Note  Patient: Erika Conley  Procedure(s) Performed: Procedure(s) (LRB): ENDOSCOPIC RETROGRADE CHOLANGIOPANCREATOGRAPHY (ERCP) (N/A)  Patient location during evaluation: PACU Anesthesia Type: General Level of consciousness: sedated and patient cooperative Pain management: pain level controlled Vital Signs Assessment: post-procedure vital signs reviewed and stable Respiratory status: spontaneous breathing Cardiovascular status: stable Anesthetic complications: no Comments: Mild delirium post procedure. Her mental status rapidly improved AAOx3    Last Vitals:  Vitals:   10/22/15 1730 10/22/15 1740  BP: (!) 190/93 (!) 187/53  Pulse: (!) 104   Resp: (!) 22   Temp:      Last Pain:  Vitals:   10/22/15 1520  TempSrc: Oral  PainSc: [place]

## 2015-10-22 NOTE — ED Provider Notes (Signed)
[facility] DEPT Provider Note   CSN: [id number] Arrival date & time: 10/22/15  [id number]     History   Chief Complaint Chief Complaint  Patient presents with  . Abdominal Pain    HPI Erika Conley is a 81 y.o. female with abdominal pain and diarrhea that began yesterday. The abdominal pain has been gradual in onset and is now moderate severity. It is located diffusely but most prominent in the periumbilical region. The diarrhea has been described as nonbloody. She has also had nausea but no vomiting. She was given Zofran by protocol with improvement in her nausea. She was also given a small amount of morphine which has improved her pain; she refused additional morphine due to intolerance of narcotics. She is not aware of having a fever. She denies chest pain or shortness of breath but is having generalized body aches.  HPI  Past Medical History:  Diagnosis Date  . Anxiety   . Arthritis   . Depression   . Falls frequently   . Fracture, clavicle   . GERD (gastroesophageal reflux disease)   . History of fractured vertebra   . Hypertension   . Seasonal allergies   . Thyroid disease   . Vertigo   . Wrist fracture, right     Patient Active Problem List   Diagnosis Date Noted  . Dehydration with hyponatremia 04/09/2015  . Acute respiratory failure with hypoxia ([place]) 04/09/2015  . Thrombocytopenia ([place]) 04/09/2015  . Hypokalemia 11/10/2014  . Hypertension 11/10/2014  . Hypothyroidism 11/10/2014  . Syncope due to orthostatic hypotension 11/10/2014  . Constipated   . UTI (urinary tract infection) 06/04/2013  . Altered mental status 06/04/2013    Past Surgical History:  Procedure Laterality Date  . ABDOMINAL HYSTERECTOMY    . BACK SURGERY    . CHOLECYSTECTOMY    . FRACTURE SURGERY    . THYROIDECTOMY    . WRIST SURGERY      OB History    No data available       Home Medications    Prior to Admission medications   Medication Sig Start Date End Date Taking?  Authorizing Provider  Alcaftadine (LASTACAFT) 0.25 % SOLN Place 1 drop into both eyes at bedtime.   Yes Historical Provider, MD  aluminum hydroxide-magnesium carbonate (GAVISCON) 95-358 MG/15ML SUSP Take 10 mLs by mouth every 6 (six) hours as needed for indigestion.   Yes Historical Provider, MD  antiseptic oral rinse (BIOTENE) LIQD Swish and spit 15 mLs 3 (three) times daily.    Yes Historical Provider, MD  benzocaine-resorcinol (VAGISIL) 5-2 % vaginal cream Place 1 application vaginally as needed for itching.   Yes Historical Provider, MD  bismuth subsalicylate (PEPTO BISMOL) 262 MG/15ML suspension Take 30 mLs by mouth every 4 (four) hours as needed for diarrhea or loose stools.   Yes Historical Provider, MD  calcium-vitamin D (OSCAL WITH D) 500-200 MG-UNIT tablet Take 1 tablet by mouth daily.   Yes Historical Provider, MD  cetirizine (ZYRTEC) 10 MG tablet Take 10 mg by mouth daily.   Yes Historical Provider, MD  chlorhexidine (PERIDEX) 0.12 % solution Swish and spit 15 mLs 2 (two) times daily.   Yes Historical Provider, MD  Dexlansoprazole 30 MG capsule Take 30 mg by mouth daily.   Yes Historical Provider, MD  dextromethorphan (DELSYM) 30 MG/5ML liquid Take 10 mLs by mouth every 12 (twelve) hours as needed for cough.   Yes Historical Provider, MD  fluticasone (FLONASE) 50 MCG/ACT nasal spray Place 2  sprays into both nostrils daily.   Yes Historical Provider, MD  hydrochlorothiazide (HYDRODIURIL) 25 MG tablet Take 25 mg by mouth daily.   Yes Historical Provider, MD  hydrocortisone cream 1 % Apply 1 application topically every 6 (six) hours as needed for itching.   Yes Historical Provider, MD  hydroxypropyl methylcellulose / hypromellose (ISOPTO TEARS / GONIOVISC) 2.5 % ophthalmic solution Place 1 drop into both eyes every 4 (four) hours as needed for dry eyes.    Yes Historical Provider, MD  hydrOXYzine (VISTARIL) 25 MG capsule Take 25 mg by mouth every 8 (eight) hours as needed for anxiety.   Yes  Historical Provider, MD  Infant Care Products (JOHNSONS BABY SHAMPOO EX) Apply topically. Dilute a pea size amount with water; close right eye, wash and rinse. Repeat twice daily for 2 days prior to injection.   Yes Historical Provider, MD  levothyroxine (SYNTHROID, LEVOTHROID) 112 MCG tablet Take 112 mcg by mouth daily before breakfast.   Yes Historical Provider, MD  loperamide (IMODIUM) 2 MG capsule Take 2 mg by mouth daily as needed for diarrhea or loose stools. After each loose stool   Yes Historical Provider, MD  methocarbamol (ROBAXIN) 500 MG tablet Take 1,000 mg by mouth 3 (three) times daily.    Yes Historical Provider, MD  Multiple Vitamins-Minerals (PRESERVISION AREDS) CAPS Take 1 capsule by mouth 2 (two) times daily.   Yes Historical Provider, MD  ondansetron (ZOFRAN) 8 MG tablet Take 8 mg by mouth every 8 (eight) hours as needed for nausea or vomiting.   Yes Historical Provider, MD  tobramycin (TOBREX) 0.3 % ophthalmic solution Place 1 drop into the right eye 4 (four) times daily as needed (the day prior, the day of, and the day after treatment.).    Yes Historical Provider, MD  traMADol (ULTRAM) 50 MG tablet Take 50 mg by mouth every 6 (six) hours as needed for moderate pain.   Yes Historical Provider, MD  traZODone (DESYREL) 100 MG tablet Take 200 mg by mouth at bedtime.    Yes Historical Provider, MD  triamcinolone cream (KENALOG) 0.1 % Apply 1 application topically every 6 (six) hours as needed (itching).   Yes Historical Provider, MD  trolamine salicylate (ASPERCREME) 10 % cream Apply 1 application topically every 8 (eight) hours as needed for muscle pain.    Yes Historical Provider, MD  albuterol (PROVENTIL HFA;VENTOLIN HFA) 108 (90 BASE) MCG/ACT inhaler Inhale 2 puffs into the lungs every 4 (four) hours as needed for wheezing or shortness of breath (coough). Patient not taking: Reported on 10/22/2015 01/29/15   Gareth Morgan, MD  benzonatate (TESSALON) 100 MG capsule Take 1 capsule  (100 mg total) by mouth 3 (three) times daily as needed for cough. Patient not taking: Reported on 10/22/2015 01/29/15   Gareth Morgan, MD  meclizine (ANTIVERT) 25 MG tablet Take 1 tablet (25 mg total) by mouth 3 (three) times daily as needed for dizziness. Patient not taking: Reported on 10/22/2015 04/12/15   Thurnell Lose, MD    Family History Family History  Problem Relation Age of Onset  . Hypertension Other     Social History Social History  Substance Use Topics  . Smoking status: Never Smoker  . Smokeless tobacco: Never Used  . Alcohol use No     Allergies   Codeine; Cortisone; and Other   Review of Systems Review of Systems  All other systems reviewed and are negative.   Physical Exam Updated Vital Signs BP (!) 120/52  Pulse 66   Temp 98.8 F (37.1 C) (Oral)   Resp 13   Ht 5' (1.524 m)   Wt 150 lb (68 kg)   SpO2 95%   BMI 29.29 kg/m   Physical Exam General: Well-developed, well-nourished female in no acute distress; appearance consistent with age of record HENT: normocephalic; atraumatic; edentulous Eyes: pupils equal, round and reactive to light; extraocular muscles intact Neck: supple Heart: regular rate and rhythm Lungs: clear to auscultation bilaterally Abdomen: soft; nondistended; diffusely tender; no masses or hepatosplenomegaly; bowel sounds present Extremities: No deformity; full range of motion; pulses normal Neurologic: Awake, alert and oriented; motor function intact in all extremities and symmetric; no facial droop Skin: Warm and dry Psychiatric: Normal mood and affect    ED Treatments / Results  Nursing notes and vitals signs, including pulse oximetry, reviewed.  Summary of this visit's results, reviewed by myself:  Labs:  Results for orders placed or performed during the hospital encounter of 10/22/15 (from the past 24 hour(s))  CBC with Differential     Status: Abnormal   Collection Time: 10/22/15  5:43 AM  Result Value Ref  Range   WBC 10.2 4.0 - 10.5 K/uL   RBC 5.03 3.87 - 5.11 MIL/uL   Hemoglobin 15.0 12.0 - 15.0 g/dL   HCT 41.9 36.0 - 46.0 %   MCV 83.3 78.0 - 100.0 fL   MCH 29.8 26.0 - 34.0 pg   MCHC 35.8 30.0 - 36.0 g/dL   RDW 12.6 11.5 - 15.5 %   Platelets  150 - 400 K/uL    PLATELET CLUMPS NOTED ON SMEAR, COUNT APPEARS ADEQUATE   Neutrophils Relative % 82 %   Lymphocytes Relative 7 %   Monocytes Relative 11 %   Eosinophils Relative 0 %   Basophils Relative 0 %   Neutro Abs 8.4 (H) 1.7 - 7.7 K/uL   Lymphs Abs 0.7 0.7 - 4.0 K/uL   Monocytes Absolute 1.1 (H) 0.1 - 1.0 K/uL   Eosinophils Absolute 0.0 0.0 - 0.7 K/uL   Basophils Absolute 0.0 0.0 - 0.1 K/uL   Smear Review PLATELET CLUMPS NOTED ON SMEAR   Comprehensive metabolic panel     Status: Abnormal   Collection Time: 10/22/15  5:43 AM  Result Value Ref Range   Sodium 133 (L) 135 - 145 mmol/L   Potassium 3.2 (L) 3.5 - 5.1 mmol/L   Chloride 95 (L) 101 - 111 mmol/L   CO2 29 22 - 32 mmol/L   Glucose, Bld 126 (H) 65 - 99 mg/dL   BUN 11 6 - 20 mg/dL   Creatinine, Ser 0.64 0.44 - 1.00 mg/dL   Calcium 9.2 8.9 - 10.3 mg/dL   Total Protein 6.9 6.5 - 8.1 g/dL   Albumin 4.2 3.5 - 5.0 g/dL   AST 471 (H) 15 - 41 U/L   ALT 375 (H) 14 - 54 U/L   Alkaline Phosphatase 263 (H) 38 - 126 U/L   Total Bilirubin 8.0 (H) 0.3 - 1.2 mg/dL   GFR calc non Af Amer >60 >60 mL/min   GFR calc Af Amer >60 >60 mL/min   Anion gap 9 5 - 15  Lipase, blood     Status: Abnormal   Collection Time: 10/22/15  5:43 AM  Result Value Ref Range   Lipase 135 (H) 11 - 51 U/L  Urinalysis, Routine w reflex microscopic (not at [facility])     Status: Abnormal   Collection Time: 10/22/15  6:11 AM  Result Value  Ref Range   Color, Urine ORANGE (A) YELLOW   APPearance CLEAR CLEAR   Specific Gravity, Urine 1.009 1.005 - 1.030   pH 7.0 5.0 - 8.0   Glucose, UA NEGATIVE NEGATIVE mg/dL   Hgb urine dipstick NEGATIVE NEGATIVE   Bilirubin Urine LARGE (A) NEGATIVE   Ketones, ur 40 (A) NEGATIVE  mg/dL   Protein, ur NEGATIVE NEGATIVE mg/dL   Nitrite POSITIVE (A) NEGATIVE   Leukocytes, UA SMALL (A) NEGATIVE  Urine microscopic-add on     Status: Abnormal   Collection Time: 10/22/15  6:11 AM  Result Value Ref Range   Squamous Epithelial / LPF 0-5 (A) NONE SEEN   WBC, UA 0-5 0 - 5 WBC/hpf   RBC / HPF 0-5 0 - 5 RBC/hpf   Bacteria, UA FEW (A) NONE SEEN    Imaging Studies: Ct Abdomen Pelvis W Contrast  Result Date: 10/22/2015 CLINICAL DATA:  Abdominal pain with elevated white blood cell count, elevated lipase, and nausea/vomiting EXAM: CT ABDOMEN AND PELVIS WITH CONTRAST TECHNIQUE: Multidetector CT imaging of the abdomen and pelvis was performed using the standard protocol following bolus administration of intravenous contrast. Oral contrast was also administered. CONTRAST:  168mL ISOVUE-300 IOPAMIDOL (ISOVUE-300) INJECTION 61% COMPARISON:  None. FINDINGS: Lower chest:  There is patchy bibasilar atelectatic change. Hepatobiliary: The liver measures 17.5 cm in length. There is hepatic steatosis. No focal liver mass is seen. However, there is marked intrahepatic biliary duct dilatation. Gallbladder is absent. The common bile duct is dilated to the level of the ampulla. There is mild dilatation of the adjacent pancreatic duct in this area. There is an area of decreased attenuation in the pancreatic head measuring 2.6 x 1.9 x 1.8 cm, best appreciable on axial slice 27 series 2 and coronal slice 46 series 4. There is no peripancreatic mesenteric stranding or thickening. There is a cystic area in the tail of the pancreas measuring 8 x 7 x 8 mm, best seen on axial slice 22 series 2 and coronal slice 57 series 4. No other pancreatic lesions are evident. Pancreas: No splenic lesions are evident. Spleen: Adrenals appear normal bilaterally. Adrenals/Urinary Tract: Kidneys bilaterally show no mass or hydronephrosis on either side. There is no renal or ureteral calculus on either side. Urinary bladder is midline  with wall thickness within normal limits. Stomach/Bowel: There is no appreciable bowel wall or mesenteric thickening. There are scattered colonic diverticula without diverticulitis. There is no bowel obstruction. No free air or portal venous air. There is lipomatous infiltration of the ileocecal valve. Vascular/Lymphatic: There is atherosclerotic calcification in the aorta but no aneurysm appreciable. Major mesenteric vessels appear patent. There is no evident adenopathy in the abdomen or pelvis. Reproductive: Uterus is absent. There is no pelvic mass or pelvic fluid collection. Other: Appendix is not appreciable. There is no periappendiceal region inflammation. There is no abscess or ascites in the abdomen or pelvis. Musculoskeletal: The patient has had kyphoplasty procedures at T11 and T12. There is also marked collapse of the T10 vertebral body with increase in kyphosis in these regions. There is moderate spinal stenosis at T12 due to a degree of retropulsion of bone in this area. There are no blastic or lytic bone lesions. There is no intramuscular or abdominal wall lesion. IMPRESSION: **An incidental finding of potential clinical significance has been found. Concern for pancreatic neoplasm in the head of the pancreas measuring 2.6 x 1.9 x 1.8 cm. There is marked biliary duct dilatation, likely due to compression from this apparent  mass. There is in addition a subcentimeter cystic area in the tail of the pancreas. Given these findings, MR correlation with particular attention to the pancreas pre and post-contrast advised.**Note that there is no surrounding peripancreatic mesenteric stranding or edema as would be expected with pancreatitis. Pancreatitis is possible in this circumstance but felt to be less likely than neoplasm given the overall appearance. Liver is prominent with hepatic steatosis. No focal liver mass lesions are evident. No adenopathy evident. No bowel obstruction. No abscess. No periappendiceal  region inflammation. Spinal stenosis due to retropulsion of bone at that level of T12. Patient has had fractures of T10, T11, and T12. The patient has undergone kyphoplasty procedures at T11 and T12. There is localized kyphosis in the lower thoracic spine in the areas of these fractures. Electronically Signed   By: Lowella Grip III M.D.   On: 10/22/2015 07:40     Procedures (including critical care time)  Final Clinical Impressions(s) / ED Diagnoses   Final diagnoses:  Pancreatic mass       Shanon Rosser, MD 10/22/15 [id number]

## 2015-10-22 NOTE — Interval H&P Note (Signed)
History and Physical Interval Note:  10/22/2015 3:56 PM  [name]  has presented today for surgery, with the diagnosis of Pancreatic Mass, Elevated LFT's, Hyperbilirubinemia, Abdominal Pain  The various methods of treatment have been discussed with the patient and family. After consideration of risks, benefits and other options for treatment, the patient has consented to  Procedure(s): ENDOSCOPIC RETROGRADE CHOLANGIOPANCREATOGRAPHY (ERCP) (N/A) as a surgical intervention .  The patient's history has been reviewed, patient examined, no change in status, stable for surgery.  I have reviewed the patient's chart and labs.  Questions were answered to the patient's satisfaction.     Milus Banister   I'm struck that she has not been losing weight and that she is presenting with such acute symptoms (diarrhea, pain, now low grade fever).  This is not a very common way for pancreas mass to present.  CT report suggests mass in pancreas but my review of those images I find it hard to see a clear mass.  Bile duct is certainly very dilated and I'm proceeding with ERCP now, concerned about cholangitis, stone disease and pancreatico-biliary mass.

## 2015-10-22 NOTE — ED Triage Notes (Signed)
Patient BIB [facility] from [facility] for C/O nausea, diarrhea, and abdominal pain since yesterday at 1600. Patient denies emesis, denies any blood seen in stool. Patient also c/o headache and chronic pack pain.

## 2015-10-22 NOTE — ED Notes (Signed)
Bed: [id number] Expected date:  Expected time:  Means of arrival:  Comments: EMS 81 yo abd pain N/V/D

## 2015-10-22 NOTE — ED Notes (Signed)
Delay in transfer to floor related to hospitalist at bedside. 

## 2015-10-22 NOTE — Consult Note (Signed)
Consultation  Referring Provider: Dr. Ralene Bathe     Primary Care Physician:  Leonard Downing, MD Primary Gastroenterologist: None      Reason for Consultation:  Elevated LFT's, Abnormal CT, Abdominal pain, Diarrhea          HPI:   Erika Conley is a 81 y.o. female with a past medical history significant for anxiety, arthritis, depression, GERD and hypertension who presented to the ER this morning with a complaint of abdominal pain and diarrhea.  Today, the patient describes that 2 days ago she started with severe diarrhea noting that she "went to the bathroom all day and all night". This continued into the next day and night and around 2:00 this morning she woke with a severe abdominal pain which felt like "her abdomen was on fire". At that time her assisting living facility transferred her to the ER. Patient describes that she has not eaten anything since 4:00 on 10/20/15. She has had nausea over this time period which was somewhat relieved after antiemetics in the ER. Associated symptoms include chills and aching as well as some sweats, patient is unsure about fever as it was "never checked at the assisted living facility". She does tell me that her diarrhea has stopped since time of arrival to the ER.  Patient also has history of reflux for which she uses Gaviscon.  Patient denies vomiting, previous episodes of the same, use of anticoagulants or NSAIDs or history of melena or hematochezia.  ED course: CMP showing elevated alkaline phosphatase at 263, AST 471, ALT 375 and total bili increased at 8, potassium is low at 3.2; CT of the abdomen and pelvis with contrast showing concern for pancreatic neoplasm in the head of the pancreas measuring 2.6 x 1.9 x 1.8 cm. There is marked biliary duct dilation, likely due to compression from this apparent. There is in addition a subcentimeter cystic area in the tail of the pancreas. There is no surrounding peripancreatic mesenteric stranding or edema.  Liver is prominent with hepatic steatosis. 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 Patient has had fractures of T10-T11 and T12. Status post kyphoplasty procedures at T11 and T12.  Past GI history: 11/12/01: Dr. Vladimir Faster, colonoscopy: Assessment: Colon polyps; pathology: Adenomatous polyp with no high-grade dysplasia or invasive malignancy 11/12/01: Dr. Vladimir Faster, EGD: Assessment: Abnormal pylorus fixed in open position with abundant bile in the stomach; pathology: Reactive gastropathy  Past Medical History:  Diagnosis Date  . Anxiety   . Arthritis   . Depression   . Falls frequently   . Fracture, clavicle   . GERD (gastroesophageal reflux disease)   . History of fractured vertebra   . Hypertension   . Seasonal allergies   . Thyroid disease   . Vertigo   . Wrist fracture, right     Past Surgical History:  Procedure Laterality Date  . ABDOMINAL HYSTERECTOMY    . BACK SURGERY    . CHOLECYSTECTOMY    . FRACTURE SURGERY    . THYROIDECTOMY    . WRIST SURGERY      Family History  Problem Relation Age of Onset  . Hypertension Other     Social History  Substance Use Topics  . Smoking status: Never Smoker  . Smokeless tobacco: Never Used  . Alcohol use No    Prior to Admission medications   Medication Sig Start Date End Date Taking? Authorizing Provider  Alcaftadine (LASTACAFT) 0.25 % SOLN Place 1 drop  into both eyes at bedtime.   Yes Historical Provider, MD  aluminum hydroxide-magnesium carbonate (GAVISCON) 95-358 MG/15ML SUSP Take 10 mLs by mouth every 6 (six) hours as needed for indigestion.   Yes Historical Provider, MD  antiseptic oral rinse (BIOTENE) LIQD Swish and spit 15 mLs 3 (three) times daily.    Yes Historical Provider, MD  benzocaine-resorcinol (VAGISIL) 5-2 % vaginal cream Place 1 application vaginally as needed for itching.   Yes Historical Provider, MD  bismuth subsalicylate (PEPTO BISMOL) 262 MG/15ML suspension Take 30 mLs by  mouth every 4 (four) hours as needed for diarrhea or loose stools.   Yes Historical Provider, MD  calcium-vitamin D (OSCAL WITH D) 500-200 MG-UNIT tablet Take 1 tablet by mouth daily.   Yes Historical Provider, MD  cetirizine (ZYRTEC) 10 MG tablet Take 10 mg by mouth daily.   Yes Historical Provider, MD  chlorhexidine (PERIDEX) 0.12 % solution Swish and spit 15 mLs 2 (two) times daily.   Yes Historical Provider, MD  Dexlansoprazole 30 MG capsule Take 30 mg by mouth daily.   Yes Historical Provider, MD  dextromethorphan (DELSYM) 30 MG/5ML liquid Take 10 mLs by mouth every 12 (twelve) hours as needed for cough.   Yes Historical Provider, MD  fluticasone (FLONASE) 50 MCG/ACT nasal spray Place 2 sprays into both nostrils daily.   Yes Historical Provider, MD  hydrochlorothiazide (HYDRODIURIL) 25 MG tablet Take 25 mg by mouth daily.   Yes Historical Provider, MD  hydrocortisone cream 1 % Apply 1 application topically every 6 (six) hours as needed for itching.   Yes Historical Provider, MD  hydroxypropyl methylcellulose / hypromellose (ISOPTO TEARS / GONIOVISC) 2.5 % ophthalmic solution Place 1 drop into both eyes every 4 (four) hours as needed for dry eyes.    Yes Historical Provider, MD  hydrOXYzine (VISTARIL) 25 MG capsule Take 25 mg by mouth every 8 (eight) hours as needed for anxiety.   Yes Historical Provider, MD  Infant Care Products (JOHNSONS BABY SHAMPOO EX) Apply topically. Dilute a pea size amount with water; close right eye, wash and rinse. Repeat twice daily for 2 days prior to injection.   Yes Historical Provider, MD  levothyroxine (SYNTHROID, LEVOTHROID) 112 MCG tablet Take 112 mcg by mouth daily before breakfast.   Yes Historical Provider, MD  loperamide (IMODIUM) 2 MG capsule Take 2 mg by mouth daily as needed for diarrhea or loose stools. After each loose stool   Yes Historical Provider, MD  methocarbamol (ROBAXIN) 500 MG tablet Take 1,000 mg by mouth 3 (three) times daily.    Yes Historical  Provider, MD  Multiple Vitamins-Minerals (PRESERVISION AREDS) CAPS Take 1 capsule by mouth 2 (two) times daily.   Yes Historical Provider, MD  ondansetron (ZOFRAN) 8 MG tablet Take 8 mg by mouth every 8 (eight) hours as needed for nausea or vomiting.   Yes Historical Provider, MD  tobramycin (TOBREX) 0.3 % ophthalmic solution Place 1 drop into the right eye 4 (four) times daily as needed (the day prior, the day of, and the day after treatment.).    Yes Historical Provider, MD  traMADol (ULTRAM) 50 MG tablet Take 50 mg by mouth every 6 (six) hours as needed for moderate pain.   Yes Historical Provider, MD  traZODone (DESYREL) 100 MG tablet Take 200 mg by mouth at bedtime.    Yes Historical Provider, MD  triamcinolone cream (KENALOG) 0.1 % Apply 1 application topically every 6 (six) hours as needed (itching).   Yes Historical Provider,  MD  trolamine salicylate (ASPERCREME) 10 % cream Apply 1 application topically every 8 (eight) hours as needed for muscle pain.    Yes Historical Provider, MD  albuterol (PROVENTIL HFA;VENTOLIN HFA) 108 (90 BASE) MCG/ACT inhaler Inhale 2 puffs into the lungs every 4 (four) hours as needed for wheezing or shortness of breath (coough). Patient not taking: Reported on 10/22/2015 01/29/15   Gareth Morgan, MD  benzonatate (TESSALON) 100 MG capsule Take 1 capsule (100 mg total) by mouth 3 (three) times daily as needed for cough. Patient not taking: Reported on 10/22/2015 01/29/15   Gareth Morgan, MD  meclizine (ANTIVERT) 25 MG tablet Take 1 tablet (25 mg total) by mouth 3 (three) times daily as needed for dizziness. Patient not taking: Reported on 10/22/2015 04/12/15   Thurnell Lose, MD    No current facility-administered medications for this encounter.    Current Outpatient Prescriptions  Medication Sig Dispense Refill  . Alcaftadine (LASTACAFT) 0.25 % SOLN Place 1 drop into both eyes at bedtime.    Marland Kitchen aluminum hydroxide-magnesium carbonate (GAVISCON) 95-358 MG/15ML SUSP  Take 10 mLs by mouth every 6 (six) hours as needed for indigestion.    Marland Kitchen antiseptic oral rinse (BIOTENE) LIQD Swish and spit 15 mLs 3 (three) times daily.     . benzocaine-resorcinol (VAGISIL) 5-2 % vaginal cream Place 1 application vaginally as needed for itching.    . bismuth subsalicylate (PEPTO BISMOL) 262 MG/15ML suspension Take 30 mLs by mouth every 4 (four) hours as needed for diarrhea or loose stools.    . calcium-vitamin D (OSCAL WITH D) 500-200 MG-UNIT tablet Take 1 tablet by mouth daily.    . cetirizine (ZYRTEC) 10 MG tablet Take 10 mg by mouth daily.    . chlorhexidine (PERIDEX) 0.12 % solution Swish and spit 15 mLs 2 (two) times daily.    Marland Kitchen Dexlansoprazole 30 MG capsule Take 30 mg by mouth daily.    Marland Kitchen dextromethorphan (DELSYM) 30 MG/5ML liquid Take 10 mLs by mouth every 12 (twelve) hours as needed for cough.    . fluticasone (FLONASE) 50 MCG/ACT nasal spray Place 2 sprays into both nostrils daily.    . hydrochlorothiazide (HYDRODIURIL) 25 MG tablet Take 25 mg by mouth daily.    . hydrocortisone cream 1 % Apply 1 application topically every 6 (six) hours as needed for itching.    . hydroxypropyl methylcellulose / hypromellose (ISOPTO TEARS / GONIOVISC) 2.5 % ophthalmic solution Place 1 drop into both eyes every 4 (four) hours as needed for dry eyes.     . hydrOXYzine (VISTARIL) 25 MG capsule Take 25 mg by mouth every 8 (eight) hours as needed for anxiety.    Candace Gallus Care Products (JOHNSONS BABY SHAMPOO EX) Apply topically. Dilute a pea size amount with water; close right eye, wash and rinse. Repeat twice daily for 2 days prior to injection.    Marland Kitchen levothyroxine (SYNTHROID, LEVOTHROID) 112 MCG tablet Take 112 mcg by mouth daily before breakfast.    . loperamide (IMODIUM) 2 MG capsule Take 2 mg by mouth daily as needed for diarrhea or loose stools. After each loose stool    . methocarbamol (ROBAXIN) 500 MG tablet Take 1,000 mg by mouth 3 (three) times daily.     . Multiple  Vitamins-Minerals (PRESERVISION AREDS) CAPS Take 1 capsule by mouth 2 (two) times daily.    . ondansetron (ZOFRAN) 8 MG tablet Take 8 mg by mouth every 8 (eight) hours as needed for nausea or vomiting.    Marland Kitchen  tobramycin (TOBREX) 0.3 % ophthalmic solution Place 1 drop into the right eye 4 (four) times daily as needed (the day prior, the day of, and the day after treatment.).     Marland Kitchen traMADol (ULTRAM) 50 MG tablet Take 50 mg by mouth every 6 (six) hours as needed for moderate pain.    . traZODone (DESYREL) 100 MG tablet Take 200 mg by mouth at bedtime.     . triamcinolone cream (KENALOG) 0.1 % Apply 1 application topically every 6 (six) hours as needed (itching).    . trolamine salicylate (ASPERCREME) 10 % cream Apply 1 application topically every 8 (eight) hours as needed for muscle pain.     Marland Kitchen albuterol (PROVENTIL HFA;VENTOLIN HFA) 108 (90 BASE) MCG/ACT inhaler Inhale 2 puffs into the lungs every 4 (four) hours as needed for wheezing or shortness of breath (coough). (Patient not taking: Reported on 10/22/2015) 3.7 g 0  . benzonatate (TESSALON) 100 MG capsule Take 1 capsule (100 mg total) by mouth 3 (three) times daily as needed for cough. (Patient not taking: Reported on 10/22/2015) 21 capsule 0  . meclizine (ANTIVERT) 25 MG tablet Take 1 tablet (25 mg total) by mouth 3 (three) times daily as needed for dizziness. (Patient not taking: Reported on 10/22/2015) 30 tablet 0    Allergies as of 10/22/2015 - Review Complete 10/22/2015  Allergen Reaction Noted  . Codeine  07/20/2010  . Cortisone  07/20/2010  . Other  02/16/2013     Review of Systems:    Constitutional: Positive for fatigue, chills and sweats No weight loss HEENT: Eyes: No change in vision or yellow sclerae               Ears, Nose, Throat:  No change in hearing or congestion Skin: No rash or itching Cardiovascular: No chest pain, chest pressure or palpitations    Respiratory: No SOB or cough Gastrointestinal: See HPI and otherwise  negative Genitourinary: No dysuria or change in urinary frequency Neurological: Positive for headache No dizziness or syncope Musculoskeletal: Positive for chronic back pain No new muscle or joint pain Hematologic: No bleeding or bruising Psychiatric: Positive for historyof anxiety and depression    Physical Exam:  Vital signs in last 24 hours: Temp:  [98.3 F (36.8 C)-98.9 F (37.2 C)] 98.3 F (36.8 C) (09/01 0900) Pulse Rate:  [66-73] 68 (09/01 0900) Resp:  [13-19] 15 (09/01 0900) BP: (120-128)/(52-77) 128/69 (09/01 0900) SpO2:  [84 %-100 %] 98 % (09/01 0900) Weight:  [150 lb (68 kg)] 150 lb (68 kg) (09/01 0334)   General:   Pleasant Elderly Caucasian female appears to be in NAD, Well developed, Well nourished, alert and cooperative Head:  Normocephalic and atraumatic. Eyes:   PEERL, EOMI. No icterus. Conjunctiva pink. Ears:  Normal auditory acuity. Neck:  Supple Throat: Oral cavity and pharynx without inflammation, swelling or lesion. edentulous Lungs: Respirations even and unlabored. Lungs clear to auscultation bilaterally.   No wheezes, crackles, or rhonchi.  Heart: Normal S1, S2. No MRG. Regular rate and rhythm. No peripheral edema, cyanosis or pallor.  Abdomen:  Soft, nondistended, generalized tenderness to palpation, mild, patient describes somewhat worse in the left lower quadrant. No rebound or guarding. Normal bowel sounds. No appreciable masses or hepatomegaly. Rectal:  Not performed.  Msk:  Symmetrical without gross deformities. Extremities:  Without edema, no deformity or joint abnormality.  Neurologic:  Alert and  oriented x4;  grossly normal neurologically. Skin:   Dry and intact without significant lesions or rashes. Psychiatric:  Oriented to person, place and time. Demonstrates good judgement and reason without abnormal affect or behaviors.   LAB RESULTS:  Recent Labs  10/22/15 0543  WBC 10.2  HGB 15.0  HCT 41.9  PLT PLATELET CLUMPS NOTED ON SMEAR, COUNT  APPEARS ADEQUATE   BMET  Recent Labs  10/22/15 0543  NA 133*  K 3.2*  CL 95*  CO2 29  GLUCOSE 126*  BUN 11  CREATININE 0.64  CALCIUM 9.2   LFT  Recent Labs  10/22/15 0543  PROT 6.9  ALBUMIN 4.2  AST 471*  ALT 375*  ALKPHOS 263*  BILITOT 8.0*   PT/INR No results for input(s): LABPROT, INR in the last 72 hours.  STUDIES: Ct Abdomen Pelvis W Contrast  Result Date: 10/22/2015 CLINICAL DATA:  Abdominal pain with elevated white blood cell count, elevated lipase, and nausea/vomiting EXAM: CT ABDOMEN AND PELVIS WITH CONTRAST TECHNIQUE: Multidetector CT imaging of the abdomen and pelvis was performed using the standard protocol following bolus administration of intravenous contrast. Oral contrast was also administered. CONTRAST:  156m ISOVUE-300 IOPAMIDOL (ISOVUE-300) INJECTION 61% COMPARISON:  None. FINDINGS: Lower chest:  There is patchy bibasilar atelectatic change. Hepatobiliary: The liver measures 17.5 cm in length. There is hepatic steatosis. No focal liver mass is seen. However, there is marked intrahepatic biliary duct dilatation. Gallbladder is absent. The common bile duct is dilated to the level of the ampulla. There is mild dilatation of the adjacent pancreatic duct in this area. There is an area of decreased attenuation in the pancreatic head measuring 2.6 x 1.9 x 1.8 cm, best appreciable on axial slice 27 series 2 and coronal slice 46 series 4. There is no peripancreatic mesenteric stranding or thickening. There is a cystic area in the tail of the pancreas measuring 8 x 7 x 8 mm, best seen on axial slice 22 series 2 and coronal slice 57 series 4. No other pancreatic lesions are evident. Pancreas: No splenic lesions are evident. Spleen: Adrenals appear normal bilaterally. Adrenals/Urinary Tract: Kidneys bilaterally show no mass or hydronephrosis on either side. There is no renal or ureteral calculus on either side. Urinary bladder is midline with wall thickness within normal  limits. Stomach/Bowel: There is no appreciable bowel wall or mesenteric thickening. There are scattered colonic diverticula without diverticulitis. There is no bowel obstruction. No free air or portal venous air. There is lipomatous infiltration of the ileocecal valve. Vascular/Lymphatic: There is atherosclerotic calcification in the aorta but no aneurysm appreciable. Major mesenteric vessels appear patent. There is no evident adenopathy in the abdomen or pelvis. Reproductive: Uterus is absent. There is no pelvic mass or pelvic fluid collection. Other: Appendix is not appreciable. There is no periappendiceal region inflammation. There is no abscess or ascites in the abdomen or pelvis. Musculoskeletal: The patient has had kyphoplasty procedures at T11 and T12. There is also marked collapse of the T10 vertebral body with increase in kyphosis in these regions. There is moderate spinal stenosis at T12 due to a degree of retropulsion of bone in this area. There are no blastic or lytic bone lesions. There is no intramuscular or abdominal wall lesion. IMPRESSION: **An incidental finding of potential clinical significance has been found. Concern for pancreatic neoplasm in the head of the pancreas measuring 2.6 x 1.9 x 1.8 cm. There is marked biliary duct dilatation, likely due to compression from this apparent mass. There is in addition a subcentimeter cystic area in the tail of the pancreas. Given these findings, MR correlation with particular attention  to the pancreas pre and post-contrast advised.**Note that there is no surrounding peripancreatic mesenteric stranding or edema as would be expected with pancreatitis. Pancreatitis is possible in this circumstance but felt to be less likely than neoplasm given the overall appearance. Liver is prominent with hepatic steatosis. No focal liver mass lesions are evident. No adenopathy evident. No bowel obstruction. No abscess. No periappendiceal region inflammation. Spinal  stenosis due to retropulsion of bone at that level of T12. Patient has had fractures of T10, T11, and T12. The patient has undergone kyphoplasty procedures at T11 and T12. There is localized kyphosis in the lower thoracic spine in the areas of these fractures. Electronically Signed   By: Lowella Grip III M.D.   On: 10/22/2015 07:40     PREVIOUS ENDOSCOPIES:            EGD/ [facility] see history of present illness   Impression / Plan:   Impression: 1. Abdominal Pain:Increased after 2 days of diarrhea; consider relation to new finding of pancreatic mass and biliary ductal obstruction 2. Diarrhea: For the past 2 days, none since time of arrival to the ED, consider relation to above 3. Abnormal CT abdomen: showing likely pancreatic head mass and biliary ductal dilation 4. Elevated LFT's: with bili at 8 and elevate Alk phos- likely obstructive picture 5. Nausea: related to above 6. Hypokalemia: will need repletion prior to procedure  Plan: 1. Pt needs ERCP. Timing/arrangement pending, trying for this afternoon around 3:00/3:30 with Dr. Ardis Hughs, if not will arrange asap 2. Agree with NPO  3. Per Dr. Silverio Decamp appreciate surgical consult in regards to pancreatic mass 4. Continue supportive measures 5. Ordered 2 runs 31mq K IV prior to pending procedure 6. Patient does request that her son being notified of her arrival to the ED as well as recent diagnoses and possible plan for procedure. He does live in the area. She provides his name and phone number: KNada [phone] I did call him and alert him to what was going on. He acknowledges and denies any questions. He does tell me that his wife is currently going through chemo as well, so he will likely not be able to come and support his mother. 7. Discussed above with Dr. NSilverio Decamp please await any further recs  Thank you for your kind consultation, we will continue to follow.  JLavone NianLemmon  10/22/2015, 9:25 AM Pager #:  [phone]

## 2015-10-22 NOTE — Anesthesia Procedure Notes (Signed)
Procedure Name: Intubation Date/Time: 10/22/2015 4:06 PM Performed by: Lajuana Carry E Pre-anesthesia Checklist: Patient identified, Emergency Drugs available, Suction available and Patient being monitored Patient Re-evaluated:Patient Re-evaluated prior to inductionOxygen Delivery Method: Circle system utilized Preoxygenation: Pre-oxygenation with 100% oxygen Intubation Type: IV induction Ventilation: Mask ventilation without difficulty Laryngoscope Size: Miller and 2 Grade View: Grade I Tube type: Oral Tube size: 7.0 mm Number of attempts: 1 Airway Equipment and Method: Stylet Placement Confirmation: ETT inserted through vocal cords under direct vision,  positive ETCO2 and breath sounds checked- equal and bilateral Secured at: 21 cm Tube secured with: Tape Dental Injury: Teeth and Oropharynx as per pre-operative assessment

## 2015-10-22 NOTE — Op Note (Signed)
[facility] Patient Name: Erika Conley Procedure Date: 10/22/2015 MRN: [id number] Attending MD: Milus Banister , MD Date of Birth: 08/18/1934 CSN: [id number] Age: 81 Admit Type: Inpatient Procedure:                ERCP Indications:              Generalized abdominal pain, Biliary dilation on                            Computed Tomogram Scan, Jaundice; abd pain and                            profuse diarrhea for 3 days; no weight loss Providers:                Milus Banister, MD, Hilma Favors, RN, Laverta Baltimore RN, RN, William Dalton, Technician Referring MD:              Medicines:                General Anesthesia, Unasyn 3 g IV, Indomethacin 100                            mg PR Complications:            No immediate complications. Estimated blood loss:                            None Estimated Blood Loss:     Estimated blood loss: none. Procedure:                Pre-Anesthesia Assessment:                           - Prior to the procedure, a History and Physical                            was performed, and patient medications and                            allergies were reviewed. The patient's tolerance of                            previous anesthesia was also reviewed. The risks                            and benefits of the procedure and the sedation                            options and risks were discussed with the patient.                            All questions were answered, and informed consent  was obtained. Prior Anticoagulants: The patient has                            taken no previous anticoagulant or antiplatelet                            agents. ASA Grade Assessment: II - A patient with                            mild systemic disease. After reviewing the risks                            and benefits, the patient was deemed in                            satisfactory condition to  undergo the procedure.                           After obtaining informed consent, the scope was                            passed under direct vision. Throughout the                            procedure, the patient's blood pressure, pulse, and                            oxygen saturations were monitored continuously. The                            [id number] scope was introduced through                            the mouth, and used to inject contrast into and                            used to inject contrast into the bile duct. The                            ERCP was accomplished without difficulty. The                            patient tolerated the procedure well. Scope In: Scope Out: Findings:      A scout film of the abdomen was obtained. Surgical clips, consistent       with a previous cholecystectomy, were seen in the area of the right       upper quadrant of the abdomen. The esophagus was successfully intubated       under direct vision. The scope was advanced to the region of the major       papilla in the descending duodenum without detailed examination of the       pharynx, larynx and associated structures, and upper GI tract. The upper       GI tract was grossly normal. The  major papilla was quite small and there       was dark thin bile draining from it. The bile duct was deeply cannulated       and contrast was injected. Cholangiogram revealed difuse biliary       dilation (intrahepatic, CHD, CBD which measured 1.3cm). The bile duct       tapered smoothly but rapidly into the region of the major papilla       without clear stricture. There were no stones. There was no purulence.       The ampullary opening and most distal bile duct was brushed for cytology       and then it was stented with a 52mm diameter 4cm long fully covered       (removable) metal mesh stent in good position with distal 1-1.5cm of the       stent extending into the duodenum. The only  "waist" appeared to be at       the ampulla itself. There was immediate delivery of dark, non-purulent       bile through the stent into the duodenum. The main pancreatic duct was       never cannulated with wire or injected with dye. 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 She has not been losing weight and her                            presentation (abd pain and 3 days of diarrhea) is                            not very common for pancreatic tumors either. Moderate Sedation:      N/A- Per Anesthesia Care Recommendation:           - Return patient to hospital ward for ongoing care.                           - Plan is for discharge when medically stable,                            await final bile duct brushing result, likely will                            repeat ERCP in 3-4 weeks with EUS at same time,  possibly MRI of pancreas prior to that to                            understand if there truly is a mass present. Procedure Code(s):        --- Professional ---                           [id number], Endoscopic retrograde                            cholangiopancreatography (ERCP); with placement of                            endoscopic stent into biliary or pancreatic duct,                            including pre- and post-dilation and guide wire                            passage, when performed, including sphincterotomy,                            when performed, each stent Diagnosis Code(s):        --- Professional ---                           K83.1, Obstruction of bile duct                           R10.84, Generalized abdominal pain                           R17,  Unspecified jaundice                           K83.8, Other specified diseases of biliary tract CPT copyright 2016 American Medical Association. All rights reserved. The codes documented in this report are preliminary and upon coder review may  be revised to meet current compliance requirements. Milus Banister, MD 10/22/2015 5:00:42 PM This report has been signed electronically. Number of Addenda: 0

## 2015-10-22 NOTE — NC FL2 (Signed)
[place] LEVEL OF CARE SCREENING TOOL     IDENTIFICATION  Patient Name: Erika Conley Birthdate: Jul 20, 1934 Sex: female Admission Date (Current Location): 10/22/2015  [facility] and [place] Number:  Herbalist and Address:  [facility],  [place], [place]      Provider Number: [id number]  Attending Physician Name and Address:  Tawni Millers, *  Relative Name and Phone Number:       Current Level of Care: Hospital Recommended Level of Care: [place] Prior Approval Number:    Date Approved/Denied:   PASRR Number:    Discharge Plan: Other (Comment) (ALF)    Current Diagnoses: Patient Active Problem List   Diagnosis Date Noted  . Abdominal pain 10/22/2015  . Pancreatic mass 10/22/2015  . Dehydration with hyponatremia 04/09/2015  . Acute respiratory failure with hypoxia ([place]) 04/09/2015  . Thrombocytopenia ([place]) 04/09/2015  . Hypokalemia 11/10/2014  . Hypertension 11/10/2014  . Hypothyroidism 11/10/2014  . Syncope due to orthostatic hypotension 11/10/2014  . Constipated   . UTI (urinary tract infection) 06/04/2013  . Altered mental status 06/04/2013    Orientation RESPIRATION BLADDER Height & Weight     Self, Time, Situation, Place  Normal Continent Weight: 149 lb 7.6 oz (67.8 kg) Height:  5\' 1"  (154.9 cm)  BEHAVIORAL SYMPTOMS/MOOD NEUROLOGICAL BOWEL NUTRITION STATUS      Continent Diet (NPO)  AMBULATORY STATUS COMMUNICATION OF NEEDS Skin   Limited Assist Verbally Normal                       Personal Care Assistance Level of Assistance  Bathing, Feeding Bathing Assistance: Limited assistance Feeding assistance: Limited assistance       Functional Limitations Info             SPECIAL CARE FACTORS FREQUENCY                       Contractures Contractures Info: Not present    Additional Factors Info  Code Status, Allergies Code Status Info: DNR Allergies  Info: Codeine, Cortisone, Other           Current Medications (10/22/2015):  This is the current hospital active medication list Current Facility-Administered Medications  Medication Dose Route Frequency Provider Last Rate Last Dose  . acetaminophen (TYLENOL) tablet 650 mg  650 mg Oral Q6H PRN Tawni Millers, MD       Or  . acetaminophen (TYLENOL) suppository 650 mg  650 mg Rectal Q6H PRN Tawni Millers, MD      . aluminum hydroxide-magnesium carbonate (GAVISCON) 95-358 MG/15ML suspension 10 mL  10 mL Oral Q6H PRN Tawni Millers, MD      . antiseptic oral rinse (BIOTENE) solution 15 mL  15 mL Mouth Rinse TID Tawni Millers, MD      . benzocaine-resorcinol (VAGISIL) 5-2 % vaginal cream 1 application  1 application Vaginal PRN Mauricio Gerome Apley, MD      . bismuth subsalicylate (PEPTO BISMOL) 262 MG/15ML suspension 30 mL  30 mL Oral Q4H PRN Mauricio Gerome Apley, MD      . calcium-vitamin D (OSCAL WITH D) 500-200 MG-UNIT per tablet 1 tablet  1 tablet Oral Daily Mauricio Gerome Apley, MD      . dextromethorphan (DELSYM) 30 MG/5ML liquid 60 mg  10 mL Oral Q12H PRN Tawni Millers, MD      . dextrose 5 %-0.9 % sodium  chloride infusion   Intravenous Continuous Mauricio Gerome Apley, MD      . enoxaparin (LOVENOX) injection 40 mg  40 mg Subcutaneous Q24H Tawni Millers, MD      . fentaNYL (SUBLIMAZE) injection 12.5 mcg  12.5 mcg Intravenous Q4H PRN Tawni Millers, MD   12.5 mcg at 10/22/15 1201  . fluticasone (FLONASE) 50 MCG/ACT nasal spray 2 spray  2 spray Each Nare Daily Mauricio Gerome Apley, MD      . hydrOXYzine (VISTARIL) capsule 25 mg  25 mg Oral Q8H PRN Mauricio Gerome Apley, MD      . levothyroxine (SYNTHROID, LEVOTHROID) tablet 112 mcg  112 mcg Oral QAC breakfast Mauricio Gerome Apley, MD      . lip balm (CARMEX) ointment   Topical PRN Tawni Millers, MD      . methocarbamol (ROBAXIN) tablet 1,000 mg  1,000 mg Oral  TID Tawni Millers, MD      . multivitamin (PROSIGHT) tablet 1 tablet  1 tablet Oral BID Tawni Millers, MD      . ondansetron [facility]) tablet 4 mg  4 mg Oral Q6H PRN Tawni Millers, MD       Or  . ondansetron [facility]) injection 4 mg  4 mg Intravenous Q6H PRN Mauricio Gerome Apley, MD      . pantoprazole (PROTONIX) EC tablet 40 mg  40 mg Oral BID Tawni Millers, MD      . polyvinyl alcohol (LIQUIFILM TEARS) 1.4 % ophthalmic solution 1 drop  1 drop Both Eyes Q4H PRN Tawni Millers, MD      . potassium chloride 10 mEq in 100 mL IVPB  10 mEq Intravenous Q1 Hr x 2 Lavone Nian [place], [place]   10 mEq at 10/22/15 1146  . tobramycin (TOBREX) 0.3 % ophthalmic solution 1 drop  1 drop Right Eye QID PRN Tawni Millers, MD      . traMADol Veatrice [place]) tablet 50 mg  50 mg Oral Q6H PRN Tawni Millers, MD      . traZODone (DESYREL) tablet 200 mg  200 mg Oral QHS Mauricio Gerome Apley, MD         Discharge Medications: Please see discharge summary for a list of discharge medications.  Relevant Imaging Results:  Relevant Lab Results:   Additional Information SSN: [id number]  Clyde, Melvin

## 2015-10-22 NOTE — ED Notes (Signed)
Patient unable to tolerate full dose of iv morphine, morphine was mixed with NS and pushed very slowly. About half way through, patient stated she didn't think she needed anymore bc she was feeling light headed and like she was going to vomit. Provider notified.

## 2015-10-23 DIAGNOSIS — K859 Acute pancreatitis without necrosis or infection, unspecified: Secondary | ICD-10-CM | POA: Diagnosis present

## 2015-10-23 LAB — CBC
HEMATOCRIT: 36.9 % (ref 36.0–46.0)
Hemoglobin: 13.1 g/dL (ref 12.0–15.0)
MCH: 29.8 pg (ref 26.0–34.0)
MCHC: 35.5 g/dL (ref 30.0–36.0)
MCV: 84.1 fL (ref 78.0–100.0)
PLATELETS: 148 10*3/uL — AB (ref 150–400)
RBC: 4.39 MIL/uL (ref 3.87–5.11)
RDW: 12.7 % (ref 11.5–15.5)
WBC: 17.3 10*3/uL — AB (ref 4.0–10.5)

## 2015-10-23 LAB — BASIC METABOLIC PANEL
Anion gap: 6 (ref 5–15)
BUN: 11 mg/dL (ref 6–20)
CALCIUM: 8.1 mg/dL — AB (ref 8.9–10.3)
CHLORIDE: 101 mmol/L (ref 101–111)
CO2: 26 mmol/L (ref 22–32)
CREATININE: 0.64 mg/dL (ref 0.44–1.00)
GFR calc non Af Amer: >60 mL/min (ref >60)
GLUCOSE: 120 mg/dL — AB (ref 65–99)
Potassium: 3.6 mmol/L (ref 3.5–5.1)
Sodium: 133 mmol/L — ABNORMAL LOW (ref 135–145)

## 2015-10-23 LAB — COMPREHENSIVE METABOLIC PANEL
ALBUMIN: 3.2 g/dL — AB (ref 3.5–5.0)
ALT: 196 U/L — AB (ref 14–54)
AST: 137 U/L — AB (ref 15–41)
Alkaline Phosphatase: 202 U/L — ABNORMAL HIGH (ref 38–126)
Anion gap: 5 (ref 5–15)
BUN: 17 mg/dL (ref 6–20)
CHLORIDE: 101 mmol/L (ref 101–111)
CO2: 28 mmol/L (ref 22–32)
CREATININE: 0.93 mg/dL (ref 0.44–1.00)
Calcium: 8.3 mg/dL — ABNORMAL LOW (ref 8.9–10.3)
GFR calc Af Amer: >60 mL/min (ref >60)
GFR, EST NON AFRICAN AMERICAN: 57 mL/min — AB (ref >60)
GLUCOSE: 145 mg/dL — AB (ref 65–99)
POTASSIUM: 4.5 mmol/L (ref 3.5–5.1)
Sodium: 134 mmol/L — ABNORMAL LOW (ref 135–145)
Total Bilirubin: 5.4 mg/dL — ABNORMAL HIGH (ref 0.3–1.2)
Total Protein: 5.5 g/dL — ABNORMAL LOW (ref 6.5–8.1)

## 2015-10-23 LAB — LIPASE, BLOOD: Lipase: 566 U/L — ABNORMAL HIGH (ref 11–51)

## 2015-10-23 MED ORDER — HYDROXYZINE HCL 25 MG PO TABS
25.0000 mg | ORAL_TABLET | Freq: Three times a day (TID) | ORAL | Status: DC | PRN
Start: 2015-10-23 — End: 2015-10-29
  Administered 2015-10-23: 25 mg via ORAL
  Filled 2015-10-23: qty 1

## 2015-10-23 MED ORDER — LACTATED RINGERS IV SOLN
INTRAVENOUS | Status: DC
Start: 2015-10-23 — End: 2015-10-27
  Administered 2015-10-23: 1000 mL via INTRAVENOUS
  Administered 2015-10-23 – 2015-10-24 (×2): via INTRAVENOUS
  Administered 2015-10-24 – 2015-10-25 (×3): 1000 mL via INTRAVENOUS
  Administered 2015-10-25 – 2015-10-27 (×5): via INTRAVENOUS

## 2015-10-23 MED ORDER — MENTHOL 3 MG MT LOZG
1.0000 | LOZENGE | OROMUCOSAL | Status: DC | PRN
Start: 2015-10-23 — End: 2015-10-29
  Administered 2015-10-23 (×2): 3 mg via ORAL
  Filled 2015-10-23: qty 9

## 2015-10-23 MED ORDER — OXYCODONE HCL 5 MG PO TABS: 2.5000 mg | ORAL_TABLET | ORAL | Status: DC | PRN

## 2015-10-23 MED ORDER — OXYCODONE HCL 5 MG PO TABS
2.5000 mg | ORAL_TABLET | ORAL | Status: DC | PRN
  Administered 2015-10-23 – 2015-10-24 (×2): 5 mg via ORAL
  Filled 2015-10-23 (×2): qty 1

## 2015-10-23 MED ORDER — LACTATED RINGERS IV BOLUS (SEPSIS)
1000.0000 mL | Freq: Once | INTRAVENOUS | Status: AC
Start: 2015-10-23 — End: 2015-10-23
  Administered 2015-10-23: 1000 mL via INTRAVENOUS

## 2015-10-23 MED ORDER — MORPHINE SULFATE (PF) 2 MG/ML IV SOLN
2.0000 mg | INTRAVENOUS | Status: DC | PRN
  Administered 2015-10-23: 4 mg via INTRAVENOUS
  Administered 2015-10-23: 2 mg via INTRAVENOUS
  Administered 2015-10-23 – 2015-10-24 (×3): 4 mg via INTRAVENOUS
  Filled 2015-10-23 (×2): qty 2
  Filled 2015-10-23: qty 1
  Filled 2015-10-23 (×3): qty 2

## 2015-10-23 NOTE — Progress Notes (Signed)
[facility] Progress Note  Patient: Erika Conley [id number]   PCP: Leonard Downing, MD DOB: May 15, 1934   DOA: 10/22/2015   DOS: 10/23/2015   Date of Service: the patient was seen and examined on 10/23/2015  Subjective: Patient complains of significant epigastric pain. Denies any nausea or vomiting. Is tearful. Passing gas. Nutrition: Poor oral intake due to abdominal pain.  Brief hospital course: Pt. with PMH of GERD, fibromyalgia; admitted on 10/22/2015, with complaint of abdominal pain, was found to have pancreatic mass with cholestatic jaundice. 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 Gen. surgery was also consulted for pancreatic mass and workup is currently undergoing. On 10/23/2015 patient appears to have developed mild pancreatitis from ERCP. Currently further plan is continue supportive care and workup for pancreatic mass..  Assessment and Plan: 1. Pancreatic mass CT scan is showing pancreatic mass. Gastroenterology consulted general surgery consulted. Patient is status post ERCP with pancreatic stent placement for cholestatic jaundice. Patient denies having any weight loss. CEA and CA-19-9 are currently pending. We will monitor her biopsy result as well.  2. Cholestatic jaundice. Abnormal LFT. Avoid hepatotoxic medication. Improvement after pancreatic stent placement.  3. Post ERCP pancreatitis. Patient is developing significant abdominal pain, lipase level is elevated mildly. Appears to be developing post ERCP pancreatitis. Patient is having IV fluid bolus and ringer lactate. Pain management regimen changed to Dilaudid and OxyIR. Nothing by mouth with supportive treatment.  4. Hypothyroidism. Continue Synthroid.  Pain management: IV Dilaudid, OxyIR Activity: Consulted physical therapy Bowel regimen: last BM prior to admission Diet: Nothing by mouth DVT Prophylaxis: subcutaneous  Heparin  Advance goals of care discussion: DNR/DNI  Family Communication: no family was present at bedside, at the time of interview.  Disposition:  Discharge to home. Expected discharge date: 10/27/2015, improvement in abdominal symptoms as well as further workup for the pancreatic mass  Consultants: Gastroenterology, general surgery Procedures: ERCP and stent placement  Antibiotics: Anti-infectives    Start     Dose/Rate Route Frequency Ordered Stop   10/22/15 1545  Ampicillin-Sulbactam (UNASYN) 3 g in sodium chloride 0.9 % 100 mL IVPB     3 g 100 mL/hr over 60 Minutes Intravenous  Once 10/22/15 1538 10/22/15 1655        Intake/Output Summary (Last 24 hours) at 10/23/15 1536 Last data filed at 10/23/15 1408  Gross per 24 hour  Intake           2377.5 ml  Output              100 ml  Net           2277.5 ml   Filed Weights   10/22/15 0334 10/22/15 1025 10/22/15 1520  Weight: 68 kg (150 lb) 67.8 kg (149 lb 7.6 oz) 67.8 kg (149 lb 7.6 oz)    Objective: Physical Exam: Vitals:   10/22/15 1837 10/22/15 2107 10/23/15 0600 10/23/15 1418  BP: (!) 139/58 (!) 101/46 127/62 (!) 147/61  Pulse: 86 79 64 65  Resp: (!) 24 20 18 16   Temp: (!) 102 F (38.9 C) 98.9 F (37.2 C) 98.6 F (37 C) 99.1 F (37.3 C)  TempSrc: Oral Oral Oral Oral  SpO2: 94% 90% 94% 94%  Weight:      Height:        General: Alert, Awake and Oriented to Time, Place and Person. Appear in moderate distress Eyes: PERRL, Conjunctiva normal ENT: Oral Mucosa clear moist. Neck: no  JVD, no Abnormal Mass Or lumps Cardiovascular: S1 and S2 Present, aortic systolic Murmur, Respiratory: Bilateral Air entry equal and Decreased, Clear to Auscultation, no Crackles, no wheezes Abdomen: Bowel Sound present, Soft and mild tenderness Skin: no redness, no Rash  Extremities: no Pedal edema, no calf tenderness Neurologic: Grossly no focal neuro deficit. Bilaterally Equal motor strength  Data Reviewed: CBC:  Recent  Labs Lab 10/22/15 0543 10/23/15 0418  WBC 10.2 17.3*  NEUTROABS 8.4*  --   HGB 15.0 13.1  HCT 41.9 36.9  MCV 83.3 84.1  PLT PLATELET CLUMPS NOTED ON SMEAR, COUNT APPEARS ADEQUATE [id number]*   Basic Metabolic Panel:  Recent Labs Lab 10/22/15 0543 10/23/15 0418  NA 133* 134*  K 3.2* 4.5  CL 95* 101  CO2 29 28  GLUCOSE 126* 145*  BUN 11 17  CREATININE 0.64 0.93  CALCIUM 9.2 8.3*    Liver Function Tests:  Recent Labs Lab 10/22/15 0543 10/23/15 0418  AST 471* 137*  ALT 375* 196*  ALKPHOS 263* 202*  BILITOT 8.0* 5.4*  PROT 6.9 5.5*  ALBUMIN 4.2 3.2*    Recent Labs Lab 10/22/15 0543 10/23/15 0418  LIPASE 135* 566*   No results for input(s): AMMONIA in the last 168 hours. Coagulation Profile: No results for input(s): INR, PROTIME in the last 168 hours. Cardiac Enzymes: No results for input(s): CKTOTAL, CKMB, CKMBINDEX, TROPONINI in the last 168 hours. BNP (last 3 results) No results for input(s): PROBNP in the last 8760 hours.  CBG: No results for input(s): GLUCAP in the last 168 hours.  Studies: Dg Ercp  Result Date: 10/22/2015 CLINICAL DATA:  Biliary obstruction. EXAM: ERCP TECHNIQUE: Multiple spot images obtained with the fluoroscopic device and submitted for interpretation post-procedure. COMPARISON:  CT of the abdomen on 10/22/2015 FINDINGS: Submitted C-arm images from the endoscopic procedure demonstrate cannulation of the common bile duct with contrast injection demonstrating diffuse dilatation of the visualize common bile duct and intrahepatic ducts. There appears to be focal narrowing of the distal CBD/ampulla. A metallic biliary stent was placed spanning across the distal CBD and extending into the duodenum. IMPRESSION: Diffuse biliary dilatation with suggestion of stricture involving the distal CBD and/or ampulla. A metallic biliary stent was placed. These images were submitted for radiologic interpretation only. Please see the procedural report for the amount  of contrast and the fluoroscopy time utilized. Electronically Signed   By: Aletta Edouard M.D.   On: 10/22/2015 17:52     Scheduled Meds: . antiseptic oral rinse  15 mL Mouth Rinse TID  . calcium-vitamin D  1 tablet Oral Daily  . enoxaparin (LOVENOX) injection  40 mg Subcutaneous Q24H  . fluticasone  2 spray Each Nare Daily  . indomethacin  100 mg Rectal Once  . levothyroxine  112 mcg Oral QAC breakfast  . methocarbamol  1,000 mg Oral TID  . multivitamin  1 tablet Oral BID  . pantoprazole  40 mg Oral BID  . traZODone  200 mg Oral QHS   Continuous Infusions: . lactated ringers 1,000 mL (10/23/15 1305)   PRN Meds: acetaminophen **OR** acetaminophen, aluminum hydroxide-magnesium carbonate, benzocaine-resorcinol, bismuth subsalicylate, dextromethorphan, hydrOXYzine, lip balm, menthol-cetylpyridinium, morphine injection, ondansetron **OR** ondansetron (ZOFRAN) IV, oxyCODONE, polyvinyl alcohol, tobramycin  Time spent: 30 minutes  Author: Berle Mull, MD [facility] Pager: [phone] 10/23/2015 3:36 PM  If 7PM-7AM, please contact night-coverage at [email or web address], password [facility]

## 2015-10-23 NOTE — Clinical Social Work Note (Signed)
Clinical Social Work Assessment  Patient Details  Name: Erika Conley MRN: [id number] Date of Birth: 1934-11-17  Date of referral:  10/23/15               Reason for consult:  Discharge Planning                Permission sought to share information with:  Facility Art therapist granted to share information::  Yes, Verbal Permission Granted  Name::        Agency::     Relationship::     Contact Information:     Housing/Transportation Living arrangements for the past 2 months:  [place] of Information:  Patient Patient Interpreter Needed:  None Criminal Activity/Legal Involvement Pertinent to Current Situation/Hospitalization:    Significant Relationships:  Adult Children Lives with:  Self Do you feel safe going back to the place where you live?  Yes Need for family participation in patient care:  Yes (Comment)  Care giving concerns:  CSW received consult to speak with patient regarding discharge plans.    Social Worker assessment / plan:  CSW spoke with patient via phone to discuss discharge plans. Patient was admitted on 9/01 and has not yet been evaluated by physical therapy. Patient is planning on returning to her assisted living [facility] once stable for discharge. Patient stated multiple times that she was not feeling well today. Patient was oriented X4 and did not ask for any family members to be contacted.  Employment status:  Retired Forensic scientist:  Medicare PT Recommendations:  Not assessed at this time [place] / Referral to community resources:   ([place] )  Patient/Family's Response to care:  Patient appreciated CSW.   Patient/Family's Understanding of and Emotional Response to Diagnosis, Current Treatment, and Prognosis:  Patient understood her diagnosis and treatment. Patient stated she would like to return to her assisted living facility. CSW will re-evaluate if physical  therapy sees patient.   Emotional Assessment Appearance:  Appears stated age Attitude/Demeanor/Rapport:    Affect (typically observed):  Appropriate, Pleasant Orientation:  Oriented to Self, Oriented to Place, Oriented to  Time, Oriented to Situation Alcohol / Substance use:    Psych involvement (Current and /or in the community):  No (Comment)  Discharge Needs  Concerns to be addressed:  No discharge needs identified Readmission within the last 30 days:  No Current discharge risk:  None Barriers to Discharge:  No Barriers Identified   Weston Anna, LCSW 10/23/2015, 11:35 AM

## 2015-10-23 NOTE — Progress Notes (Signed)
[facility] Office:  [phone] General Surgery Progress Note   LOS: 1 day  POD -  1 Day Post-Op  Assessment/Plan: 1.  Pancreatic head mass  ENDOSCOPIC RETROGRADE CHOLANGIOPANCREATOGRAPHY (ERCP) and stent - 10/22/2015 - Ardis Hughs  T bili - 5.4 - 10/23/2015 (down from 8.0)  WBC - 17,300 - 10/23/2015 (not on antibiotics)  Doing okay - await biopsy results - will discuss with Dr. Barry Dienes  Will obtain CEA and CA 19-9  2.  DVT prophylaxis - Lovenox 3.  HTN 4.  GERD 5.  Spinal stenosis at level of T12 on CT scan  History of kyphoplasty T11-T12   Active Problems:   Abdominal pain   Pancreatic mass   Common bile duct (CBD) obstruction   Abnormal LFTs   Subjective:  Sore abdomen, but doing okay.   She is divorced.  Has 2 children.  Objective:   Vitals:   10/22/15 2107 10/23/15 0600  BP: (!) 101/46 127/62  Pulse: 79 64  Resp: 20 18  Temp: 98.9 F (37.2 C) 98.6 F (37 C)     Intake/Output from previous day:  09/01 0701 - 09/02 0700 In: 1860 [P.O.:360; I.V.:1400; IV Piggyback:100] Out: 100 [Urine:100]  Intake/Output this shift:  Total I/O In: 960 [P.O.:360; I.V.:600] Out: 100 [Urine:100]   Physical Exam:   General: Older WF who is alert and oriented.    HEENT: Normal. Pupils equal. .   Lungs: Clear   Abdomen: Sore, but soft.   Lab Results:    Recent Labs  10/22/15 0543 10/23/15 0418  WBC 10.2 17.3*  HGB 15.0 13.1  HCT 41.9 36.9  PLT PLATELET CLUMPS NOTED ON SMEAR, COUNT APPEARS ADEQUATE 148*    BMET   Recent Labs  10/22/15 0543 10/23/15 0418  NA 133* 134*  K 3.2* 4.5  CL 95* 101  CO2 29 28  GLUCOSE 126* 145*  BUN 11 17  CREATININE 0.64 0.93  CALCIUM 9.2 8.3*    PT/INR  No results for input(s): LABPROT, INR in the last 72 hours.  ABG  No results for input(s): PHART, HCO3 in the last 72 hours.  Invalid input(s): PCO2, PO2   Studies/Results:  Ct Abdomen Pelvis W Contrast  Result Date: 10/22/2015 CLINICAL DATA:  Abdominal pain with  elevated white blood cell count, elevated lipase, and nausea/vomiting EXAM: CT ABDOMEN AND PELVIS WITH CONTRAST TECHNIQUE: Multidetector CT imaging of the abdomen and pelvis was performed using the standard protocol following bolus administration of intravenous contrast. Oral contrast was also administered. CONTRAST:  162mL ISOVUE-300 IOPAMIDOL (ISOVUE-300) INJECTION 61% COMPARISON:  None. FINDINGS: Lower chest:  There is patchy bibasilar atelectatic change. Hepatobiliary: The liver measures 17.5 cm in length. There is hepatic steatosis. No focal liver mass is seen. However, there is marked intrahepatic biliary duct dilatation. Gallbladder is absent. The common bile duct is dilated to the level of the ampulla. There is mild dilatation of the adjacent pancreatic duct in this area. There is an area of decreased attenuation in the pancreatic head measuring 2.6 x 1.9 x 1.8 cm, best appreciable on axial slice 27 series 2 and coronal slice 46 series 4. There is no peripancreatic mesenteric stranding or thickening. There is a cystic area in the tail of the pancreas measuring 8 x 7 x 8 mm, best seen on axial slice 22 series 2 and coronal slice 57 series 4. No other pancreatic lesions are evident. Pancreas: No splenic lesions are evident. Spleen: Adrenals appear normal bilaterally. Adrenals/Urinary Tract: Kidneys bilaterally show no  mass or hydronephrosis on either side. There is no renal or ureteral calculus on either side. Urinary bladder is midline with wall thickness within normal limits. Stomach/Bowel: There is no appreciable bowel wall or mesenteric thickening. There are scattered colonic diverticula without diverticulitis. There is no bowel obstruction. No free air or portal venous air. There is lipomatous infiltration of the ileocecal valve. Vascular/Lymphatic: There is atherosclerotic calcification in the aorta but no aneurysm appreciable. Major mesenteric vessels appear patent. There is no evident adenopathy in the  abdomen or pelvis. Reproductive: Uterus is absent. There is no pelvic mass or pelvic fluid collection. Other: Appendix is not appreciable. There is no periappendiceal region inflammation. There is no abscess or ascites in the abdomen or pelvis. Musculoskeletal: The patient has had kyphoplasty procedures at T11 and T12. There is also marked collapse of the T10 vertebral body with increase in kyphosis in these regions. There is moderate spinal stenosis at T12 due to a degree of retropulsion of bone in this area. There are no blastic or lytic bone lesions. There is no intramuscular or abdominal wall lesion. IMPRESSION: **An incidental finding of potential clinical significance has been found. Concern for pancreatic neoplasm in the head of the pancreas measuring 2.6 x 1.9 x 1.8 cm. There is marked biliary duct dilatation, likely due to compression from this apparent mass. There is in addition a subcentimeter cystic area in the tail of the pancreas. Given these findings, MR correlation with particular attention to the pancreas pre and post-contrast advised.**Note that there is no surrounding peripancreatic mesenteric stranding or edema as would be expected with pancreatitis. Pancreatitis is possible in this circumstance but felt to be less likely than neoplasm given the overall appearance. Liver is prominent with hepatic steatosis. No focal liver mass lesions are evident. No adenopathy evident. No bowel obstruction. No abscess. No periappendiceal region inflammation. Spinal stenosis due to retropulsion of bone at that level of T12. Patient has had fractures of T10, T11, and T12. The patient has undergone kyphoplasty procedures at T11 and T12. There is localized kyphosis in the lower thoracic spine in the areas of these fractures. Electronically Signed   By: Lowella Grip III M.D.   On: 10/22/2015 07:40   Dg Ercp  Result Date: 10/22/2015 CLINICAL DATA:  Biliary obstruction. EXAM: ERCP TECHNIQUE: Multiple spot  images obtained with the fluoroscopic device and submitted for interpretation post-procedure. COMPARISON:  CT of the abdomen on 10/22/2015 FINDINGS: Submitted C-arm images from the endoscopic procedure demonstrate cannulation of the common bile duct with contrast injection demonstrating diffuse dilatation of the visualize common bile duct and intrahepatic ducts. There appears to be focal narrowing of the distal CBD/ampulla. A metallic biliary stent was placed spanning across the distal CBD and extending into the duodenum. IMPRESSION: Diffuse biliary dilatation with suggestion of stricture involving the distal CBD and/or ampulla. A metallic biliary stent was placed. These images were submitted for radiologic interpretation only. Please see the procedural report for the amount of contrast and the fluoroscopy time utilized. Electronically Signed   By: Aletta Edouard M.D.   On: 10/22/2015 17:52     Anti-infectives:   Anti-infectives    Start     Dose/Rate Route Frequency Ordered Stop   10/22/15 1545  Ampicillin-Sulbactam (UNASYN) 3 g in sodium chloride 0.9 % 100 mL IVPB     3 g 100 mL/hr over 60 Minutes Intravenous  Once 10/22/15 1538 10/22/15 1655      Alphonsa Overall, MD, FACS Pager: [facility]  Office: [phone] 10/23/2015

## 2015-10-23 NOTE — Progress Notes (Signed)
Progress Note   Subjective  Chief Complaint: Abdominal Pain, Elevated LFT's, Jaundice, Pancreatic head mass  Pt s/p ERCP yesterday with stent placement. This morning describes a lg amount of epigastric pain, untouched by recent Fentanyl. This does radiate through to her back and is causing her waves of nausea. Has not had much to eat this morning due to this. Requesting throat lozenges for a sore throat since yesterdays procedure. Did have a "runny" stool this morning.     Objective   Vital signs in last 24 hours: Temp:  [98.6 F (37 C)-102 F (38.9 C)] 98.6 F (37 C) (09/02 0600) Pulse Rate:  [64-104] 64 (09/02 0600) Resp:  [11-24] 18 (09/02 0600) BP: (101-190)/(41-111) 127/62 (09/02 0600) SpO2:  [90 %-99 %] 94 % (09/02 0600) Weight:  [149 lb 7.6 oz (67.8 kg)] 149 lb 7.6 oz (67.8 kg) (09/01 1520) Last BM Date: 10/23/15 General: Elderly Caucasian female in NAD Heart:  Regular rate and rhythm; no murmurs Lungs: Respirations even and unlabored, lungs CTA bilaterally Abdomen:  Soft, Markedly tender in epigastrum, with some involuntary guarding and nondistended. Normal bowel sounds. Extremities:  Without edema. Neurologic:  Alert and oriented,  grossly normal neurologically. Psych:  Cooperative. Normal mood and affect.  Intake/Output from previous day: 09/01 0701 - 09/02 0700 In: 1860 [P.O.:360; I.V.:1400; IV Piggyback:100] Out: 100 [Urine:100]  Lab Results:  Recent Labs  10/22/15 0543 10/23/15 0418  WBC 10.2 17.3*  HGB 15.0 13.1  HCT 41.9 36.9  PLT PLATELET CLUMPS NOTED ON SMEAR, COUNT APPEARS ADEQUATE 148*   BMET  Recent Labs  10/22/15 0543 10/23/15 0418  NA 133* 134*  K 3.2* 4.5  CL 95* 101  CO2 29 28  GLUCOSE 126* 145*  BUN 11 17  CREATININE 0.64 0.93  CALCIUM 9.2 8.3*   LFT  Recent Labs  10/23/15 0418  PROT 5.5*  ALBUMIN 3.2*  AST 137*  ALT 196*  ALKPHOS 202*  BILITOT 5.4*   PT/INR No results for input(s): LABPROT, INR in the last 72  hours.  Studies/Results: Ct Abdomen Pelvis W Contrast  Result Date: 10/22/2015 CLINICAL DATA:  Abdominal pain with elevated white blood cell count, elevated lipase, and nausea/vomiting EXAM: CT ABDOMEN AND PELVIS WITH CONTRAST TECHNIQUE: Multidetector CT imaging of the abdomen and pelvis was performed using the standard protocol following bolus administration of intravenous contrast. Oral contrast was also administered. CONTRAST:  153mL ISOVUE-300 IOPAMIDOL (ISOVUE-300) INJECTION 61% COMPARISON:  None. FINDINGS: Lower chest:  There is patchy bibasilar atelectatic change. Hepatobiliary: The liver measures 17.5 cm in length. There is hepatic steatosis. No focal liver mass is seen. However, there is marked intrahepatic biliary duct dilatation. Gallbladder is absent. The common bile duct is dilated to the level of the ampulla. There is mild dilatation of the adjacent pancreatic duct in this area. There is an area of decreased attenuation in the pancreatic head measuring 2.6 x 1.9 x 1.8 cm, best appreciable on axial slice 27 series 2 and coronal slice 46 series 4. There is no peripancreatic mesenteric stranding or thickening. There is a cystic area in the tail of the pancreas measuring 8 x 7 x 8 mm, best seen on axial slice 22 series 2 and coronal slice 57 series 4. No other pancreatic lesions are evident. Pancreas: No splenic lesions are evident. Spleen: Adrenals appear normal bilaterally. Adrenals/Urinary Tract: Kidneys bilaterally show no mass or hydronephrosis on either side. There is no renal or ureteral calculus on either side. Urinary bladder is midline  with wall thickness within normal limits. Stomach/Bowel: There is no appreciable bowel wall or mesenteric thickening. There are scattered colonic diverticula without diverticulitis. There is no bowel obstruction. No free air or portal venous air. There is lipomatous infiltration of the ileocecal valve. Vascular/Lymphatic: There is atherosclerotic calcification  in the aorta but no aneurysm appreciable. Major mesenteric vessels appear patent. There is no evident adenopathy in the abdomen or pelvis. Reproductive: Uterus is absent. There is no pelvic mass or pelvic fluid collection. Other: Appendix is not appreciable. There is no periappendiceal region inflammation. There is no abscess or ascites in the abdomen or pelvis. Musculoskeletal: The patient has had kyphoplasty procedures at T11 and T12. There is also marked collapse of the T10 vertebral body with increase in kyphosis in these regions. There is moderate spinal stenosis at T12 due to a degree of retropulsion of bone in this area. There are no blastic or lytic bone lesions. There is no intramuscular or abdominal wall lesion. IMPRESSION: **An incidental finding of potential clinical significance has been found. Concern for pancreatic neoplasm in the head of the pancreas measuring 2.6 x 1.9 x 1.8 cm. There is marked biliary duct dilatation, likely due to compression from this apparent mass. There is in addition a subcentimeter cystic area in the tail of the pancreas. Given these findings, MR correlation with particular attention to the pancreas pre and post-contrast advised.**Note that there is no surrounding peripancreatic mesenteric stranding or edema as would be expected with pancreatitis. Pancreatitis is possible in this circumstance but felt to be less likely than neoplasm given the overall appearance. Liver is prominent with hepatic steatosis. No focal liver mass lesions are evident. No adenopathy evident. No bowel obstruction. No abscess. No periappendiceal region inflammation. Spinal stenosis due to retropulsion of bone at that level of T12. Patient has had fractures of T10, T11, and T12. The patient has undergone kyphoplasty procedures at T11 and T12. There is localized kyphosis in the lower thoracic spine in the areas of these fractures. Electronically Signed   By: Lowella Grip III M.D.   On: 10/22/2015  07:40   Dg Ercp  Result Date: 10/22/2015 CLINICAL DATA:  Biliary obstruction. EXAM: ERCP TECHNIQUE: Multiple spot images obtained with the fluoroscopic device and submitted for interpretation post-procedure. COMPARISON:  CT of the abdomen on 10/22/2015 FINDINGS: Submitted C-arm images from the endoscopic procedure demonstrate cannulation of the common bile duct with contrast injection demonstrating diffuse dilatation of the visualize common bile duct and intrahepatic ducts. There appears to be focal narrowing of the distal CBD/ampulla. A metallic biliary stent was placed spanning across the distal CBD and extending into the duodenum. IMPRESSION: Diffuse biliary dilatation with suggestion of stricture involving the distal CBD and/or ampulla. A metallic biliary stent was placed. These images were submitted for radiologic interpretation only. Please see the procedural report for the amount of contrast and the fluoroscopy time utilized. Electronically Signed   By: Aletta Edouard M.D.   On: 10/22/2015 17:52       Assessment / Plan:   Assessment: 1. Abdominal Pain:Increased after 2 days of diarrhea at time of admission, now with epigastric pain after ERCP yesterday, consider pancreatitis 2. Diarrhea: For the past 2 days, more this morning; consider relation to recent bile duct obstruction, suspect thsi will resolve with time 3. Abnormal CT abdomen: showing likely pancreatic head mass and biliary ductal dilation, ERCP 10/22/15 with bile duct brushing, results pending 4. Elevated LFT's: decreasing since time of ERCP and stent placement on 10/22/15  5. Nausea:likely related to recent procedure, also need to consider acute pancreatitis 6. Hypokalemia: resolved  Plan: 1. Concern for pancreatitis, patient to remain NPO 2. Discussed results of ERCP with patient this morning, await path from bile duct brushings, CEA and CA 19-9 3. Will need to consider MRI of pancreas for further eval of mass 4. Ordered Lipase  this morning 5. Ordered throat lozenges prn 6. Continue supportive measures, at this time ordered 1 L Lactated ringer bolus and then 150cc/hr for suspected pancreatitis 7. Please await further recs from Dr. Silverio Decamp  Thank you for your kind consultation, we will continue to follow   LOS: 1 day   Erika Conley  10/23/2015, 9:15 AM  Pager # [phone]

## 2015-10-24 ENCOUNTER — Inpatient Hospital Stay (HOSPITAL_COMMUNITY): Payer: Medicare Other

## 2015-10-24 LAB — COMPREHENSIVE METABOLIC PANEL
ALBUMIN: 3.2 g/dL — AB (ref 3.5–5.0)
ALK PHOS: 187 U/L — AB (ref 38–126)
ALT: 128 U/L — ABNORMAL HIGH (ref 14–54)
ANION GAP: 7 (ref 5–15)
AST: 56 U/L — ABNORMAL HIGH (ref 15–41)
BILIRUBIN TOTAL: 2.8 mg/dL — AB (ref 0.3–1.2)
BUN: 13 mg/dL (ref 6–20)
CALCIUM: 8.3 mg/dL — AB (ref 8.9–10.3)
CO2: 26 mmol/L (ref 22–32)
Chloride: 101 mmol/L (ref 101–111)
Creatinine, Ser: 0.6 mg/dL (ref 0.44–1.00)
GFR calc Af Amer: >60 mL/min (ref >60)
GLUCOSE: 114 mg/dL — AB (ref 65–99)
POTASSIUM: 4 mmol/L (ref 3.5–5.1)
Sodium: 134 mmol/L — ABNORMAL LOW (ref 135–145)
TOTAL PROTEIN: 5.7 g/dL — AB (ref 6.5–8.1)

## 2015-10-24 LAB — CBC WITH DIFFERENTIAL/PLATELET
BASOS ABS: 0 10*3/uL (ref 0.0–0.1)
BASOS PCT: 0 %
EOS PCT: 0 %
Eosinophils Absolute: 0 10*3/uL (ref 0.0–0.7)
HCT: 37.4 % (ref 36.0–46.0)
Hemoglobin: 13.2 g/dL (ref 12.0–15.0)
Lymphocytes Relative: 4 %
Lymphs Abs: 0.5 10*3/uL — ABNORMAL LOW (ref 0.7–4.0)
MCH: 29.2 pg (ref 26.0–34.0)
MCHC: 35.3 g/dL (ref 30.0–36.0)
MCV: 82.7 fL (ref 78.0–100.0)
MONO ABS: 0.8 10*3/uL (ref 0.1–1.0)
Monocytes Relative: 7 %
NEUTROS ABS: 10.8 10*3/uL — AB (ref 1.7–7.7)
Neutrophils Relative %: 89 %
PLATELETS: 133 10*3/uL — AB (ref 150–400)
RBC: 4.52 MIL/uL (ref 3.87–5.11)
RDW: 12.8 % (ref 11.5–15.5)
WBC: 12.1 10*3/uL — AB (ref 4.0–10.5)

## 2015-10-24 LAB — PREALBUMIN: Prealbumin: 9.9 mg/dL — ABNORMAL LOW (ref 18–38)

## 2015-10-24 MED ORDER — OXYCODONE HCL 5 MG PO TABS
2.5000 mg | ORAL_TABLET | ORAL | Status: DC
  Administered 2015-10-24 – 2015-10-25 (×5): 5 mg via ORAL
  Filled 2015-10-24 (×5): qty 1

## 2015-10-24 MED ORDER — HYDROMORPHONE HCL 1 MG/ML IJ SOLN
0.5000 mg | INTRAMUSCULAR | Status: DC | PRN
  Administered 2015-10-24 – 2015-10-27 (×5): 0.5 mg via INTRAVENOUS
  Filled 2015-10-24 (×5): qty 1

## 2015-10-24 MED ORDER — MORPHINE SULFATE (PF) 4 MG/ML IV SOLN
4.0000 mg | Freq: Once | INTRAVENOUS | Status: AC
  Administered 2015-10-24: 4 mg via INTRAVENOUS

## 2015-10-24 MED ORDER — GADOBENATE DIMEGLUMINE 529 MG/ML IV SOLN
13.0000 mL | Freq: Once | INTRAVENOUS | Status: AC | PRN
  Administered 2015-10-24: 13 mL via INTRAVENOUS

## 2015-10-24 NOTE — Progress Notes (Signed)
[facility] Progress Note  Patient: Erika Conley [id number]   PCP: Leonard Downing, MD DOB: 05/22/1934   DOA: 10/22/2015   DOS: 10/24/2015   Date of Service: the patient was seen and examined on 10/24/2015  Subjective: Passing gas, no further bowel movement. Abdominal pain continues to remain uncontrolled. No nausea no vomiting. No fever no chills. Nutrition: Remains nothing by mouth at present  Brief hospital course: Pt. with PMH of GERD, fibromyalgia; admitted on 10/22/2015, with complaint of abdominal pain, was found to have pancreatic mass with cholestatic jaundice. 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 Gen. surgery was also consulted for pancreatic mass and workup is currently undergoing. On 10/23/2015 patient developed pancreatitis from ERCP. Currently further plan is continue supportive care and workup for pancreatic mass..  Assessment and Plan: 1. Pancreatic mass CT scan is showing pancreatic mass. Gastroenterology consulted general surgery consulted. Patient is status post ERCP with pancreatic stent placement for cholestatic jaundice. Patient denies having any weight loss. CEA and CA-19-9 are currently pending. We will monitor her biopsy result as well. MRI abdomen ordered by surgery today.  2. Cholestatic jaundice. Abnormal LFT. Avoid hepatotoxic medication. Continues to show Improvement after pancreatic stent placement.  3. Post ERCP pancreatitis. Patient is developing significant abdominal pain, lipase level is elevated mildly. Appears to be developing post ERCP pancreatitis. Patient was given indomethacin per protocol postprocedure.  Continue IV ringer lactate. Pain management regimen changed to Dilaudid and OxyIR. Nothing by mouth with supportive treatment. Appreciate GI evaluation.  4. Hypothyroidism. Continue Synthroid.  5. One episode of loose watery bowel movement. No further  bowel movement to suggest any concern for C. difficile. D/C enteric-precaution.  Pain management: IV Dilaudid, OxyIR Activity: Consulted physical therapy Bowel regimen: last BM 10/23/2015 Diet: Nothing by mouth DVT Prophylaxis: subcutaneous Heparin  Advance goals of care discussion: DNR/DNI  Family Communication: no family was present at bedside, at the time of interview.  Disposition:  Discharge to home. Expected discharge date: 10/27/2015, improvement in abdominal symptoms as well as further workup for the pancreatic mass  Consultants: Gastroenterology, general surgery Procedures: ERCP and stent placement  Antibiotics: Anti-infectives    Start     Dose/Rate Route Frequency Ordered Stop   10/22/15 1545  Ampicillin-Sulbactam (UNASYN) 3 g in sodium chloride 0.9 % 100 mL IVPB     3 g 100 mL/hr over 60 Minutes Intravenous  Once 10/22/15 1538 10/22/15 1655        Intake/Output Summary (Last 24 hours) at 10/24/15 0946 Last data filed at 10/24/15 0558  Gross per 24 hour  Intake           2600.3 ml  Output                0 ml  Net           2600.3 ml   Filed Weights   10/22/15 0334 10/22/15 1025 10/22/15 1520  Weight: 68 kg (150 lb) 67.8 kg (149 lb 7.6 oz) 67.8 kg (149 lb 7.6 oz)    Objective: Physical Exam: Vitals:   10/23/15 0600 10/23/15 1418 10/23/15 2049 10/24/15 0615  BP: 127/62 (!) 147/61 (!) 147/60 (!) 141/58  Pulse: 64 65 82 79  Resp: 18 16 17 16   Temp: 98.6 F (37 C) 99.1 F (37.3 C) 99 F (37.2 C) 99 F (37.2 C)  TempSrc: Oral Oral Oral Oral  SpO2: 94% 94% (!) 87% 90%  Weight:  Height:        General: Alert, Awake and Oriented to Time, Place and Person. Appear in moderate distress Eyes: PERRL, Conjunctiva normal ENT: Oral Mucosa clear moist. Neck: no JVD, no Abnormal Mass Or lumps Cardiovascular: S1 and S2 Present, aortic systolic Murmur, Respiratory: Bilateral Air entry equal and Decreased, Clear to Auscultation, no Crackles, no  wheezes Abdomen: Bowel Sound present, Soft and tenderness present Skin: no redness, no Rash  Extremities: no Pedal edema, no calf tenderness  Data Reviewed: CBC:  Recent Labs Lab 10/22/15 0543 10/23/15 0418 10/24/15 0429  WBC 10.2 17.3* 12.1*  NEUTROABS 8.4*  --  10.8*  HGB 15.0 13.1 13.2  HCT 41.9 36.9 37.4  MCV 83.3 84.1 82.7  PLT PLATELET CLUMPS NOTED ON SMEAR, COUNT APPEARS ADEQUATE 148* [id number]*   Basic Metabolic Panel:  Recent Labs Lab 10/22/15 0543 10/23/15 0418 10/23/15 1830 10/24/15 0429  NA 133* 134* 133* 134*  K 3.2* 4.5 3.6 4.0  CL 95* 101 101 101  CO2 29 28 26 26   GLUCOSE 126* 145* 120* 114*  BUN 11 17 11 13   CREATININE 0.64 0.93 0.64 0.60  CALCIUM 9.2 8.3* 8.1* 8.3*    Liver Function Tests:  Recent Labs Lab 10/22/15 0543 10/23/15 0418 10/24/15 0429  AST 471* 137* 56*  ALT 375* 196* 128*  ALKPHOS 263* 202* 187*  BILITOT 8.0* 5.4* 2.8*  PROT 6.9 5.5* 5.7*  ALBUMIN 4.2 3.2* 3.2*    Recent Labs Lab 10/22/15 0543 10/23/15 0418  LIPASE 135* 566*   No results for input(s): AMMONIA in the last 168 hours. Coagulation Profile: No results for input(s): INR, PROTIME in the last 168 hours. Cardiac Enzymes: No results for input(s): CKTOTAL, CKMB, CKMBINDEX, TROPONINI in the last 168 hours. BNP (last 3 results) No results for input(s): PROBNP in the last 8760 hours.  CBG: No results for input(s): GLUCAP in the last 168 hours.  Studies: No results found.   Scheduled Meds: . antiseptic oral rinse  15 mL Mouth Rinse TID  . calcium-vitamin D  1 tablet Oral Daily  . enoxaparin (LOVENOX) injection  40 mg Subcutaneous Q24H  . fluticasone  2 spray Each Nare Daily  . levothyroxine  112 mcg Oral QAC breakfast  . methocarbamol  1,000 mg Oral TID  . multivitamin  1 tablet Oral BID  . oxyCODONE  2.5-5 mg Oral Q4H  . pantoprazole  40 mg Oral BID  . traZODone  200 mg Oral QHS   Continuous Infusions: . lactated ringers 150 mL/hr at 10/24/15 0437    PRN Meds: acetaminophen **OR** acetaminophen, aluminum hydroxide-magnesium carbonate, benzocaine-resorcinol, bismuth subsalicylate, dextromethorphan, HYDROmorphone (DILAUDID) injection, hydrOXYzine, lip balm, menthol-cetylpyridinium, ondansetron **OR** ondansetron (ZOFRAN) IV, polyvinyl alcohol, tobramycin  Time spent: 30 minutes  Author: Berle Mull, MD [facility] Pager: [phone] 10/24/2015 9:46 AM  If 7PM-7AM, please contact night-coverage at [email or web address], password [facility]

## 2015-10-24 NOTE — Progress Notes (Signed)
Patient ID: Erika Conley, female   DOB: January 08, 1935, 81 y.o.   MRN: [id number] [facility] Surgery Office:  [phone] General Surgery Progress Note   LOS: 2 days  POD -  2 Days Post-Op  Assessment/Plan: 1.  Periampullary mass with obstructive jaundice  ENDOSCOPIC RETROGRADE CHOLANGIOPANCREATOGRAPHY (ERCP) and stent - 10/22/2015 - Ardis Hughs  T bili - 2.8 (down from 8.0)  WBC - 12.1k  Likely to stabilize and d/c back to assisted living with follow up as outpatient.    Unlikely to be a surgical candidate for whipple or ampullectomy given baseline frailty (ambulates with walker, lives in assisted living)  I will coordinate with Dr. Ardis Hughs and get her a follow up appt with me.  Will also get MRI/MRCP.   2.  DVT prophylaxis - Lovenox 3.  HTN 4.  GERD 5.  Spinal stenosis at level of T12 on CT scan  History of kyphoplasty T11-T12   Principal Problem:   Pancreatic mass Active Problems:   Abdominal pain   Common bile duct (CBD) obstruction   Abnormal LFTs   Pancreatitis   Subjective:  Abdomen still sore, but she says it is improving.    Objective:   Vitals:   10/23/15 2049 10/24/15 0615  BP: (!) 147/60 (!) 141/58  Pulse: 82 79  Resp: 17 16  Temp: 99 F (37.2 C) 99 F (37.2 C)     Intake/Output from previous day:  09/02 0701 - 09/03 0700 In: 2600.3 [P.O.:360; I.V.:2240.3] Out: -   Intake/Output this shift:  No intake/output data recorded.   Physical Exam:   General: Alert and oriented.   HEENT: Normal. Pupils equal. .   Lungs: breathing comfortably   Abdomen: soft, sl distended, non tender. Upper midline incision.   Lab Results:     Recent Labs  10/23/15 0418 10/24/15 0429  WBC 17.3* 12.1*  HGB 13.1 13.2  HCT 36.9 37.4  PLT 148* 133*    BMET    Recent Labs  10/23/15 1830 10/24/15 0429  NA 133* 134*  K 3.6 4.0  CL 101 101  CO2 26 26  GLUCOSE 120* 114*  BUN 11 13  CREATININE 0.64 0.60  CALCIUM 8.1* 8.3*    PT/INR  No results for  input(s): LABPROT, INR in the last 72 hours.  ABG  No results for input(s): PHART, HCO3 in the last 72 hours.  Invalid input(s): PCO2, PO2   Studies/Results:  Dg Ercp  Result Date: 10/22/2015 CLINICAL DATA:  Biliary obstruction. EXAM: ERCP TECHNIQUE: Multiple spot images obtained with the fluoroscopic device and submitted for interpretation post-procedure. COMPARISON:  CT of the abdomen on 10/22/2015 FINDINGS: Submitted C-arm images from the endoscopic procedure demonstrate cannulation of the common bile duct with contrast injection demonstrating diffuse dilatation of the visualize common bile duct and intrahepatic ducts. There appears to be focal narrowing of the distal CBD/ampulla. A metallic biliary stent was placed spanning across the distal CBD and extending into the duodenum. IMPRESSION: Diffuse biliary dilatation with suggestion of stricture involving the distal CBD and/or ampulla. A metallic biliary stent was placed. These images were submitted for radiologic interpretation only. Please see the procedural report for the amount of contrast and the fluoroscopy time utilized. Electronically Signed   By: Aletta Edouard M.D.   On: 10/22/2015 17:52     Anti-infectives:   Anti-infectives    Start     Dose/Rate Route Frequency Ordered Stop   10/22/15 1545  Ampicillin-Sulbactam (UNASYN) 3 g in sodium chloride 0.9 %  100 mL IVPB     3 g 100 mL/hr over 60 Minutes Intravenous  Once 10/22/15 1538 10/22/15 [place] [facility] Office: [phone] 10/24/2015

## 2015-10-24 NOTE — Progress Notes (Signed)
Progress Note   Subjective  Chief Complaint:Abdominal Pain, Elevated LFT's, Jaundice, Pancreatic Head Mass  Pt s/p ERCP 10/22/15, this morning continues with a large amount of epigastric abdominal pain, hungry, no BM since yesterday, also complains of back pain and requests heating pad.    Objective   Vital signs in last 24 hours: Temp:  [99 F (37.2 C)-99.1 F (37.3 C)] 99 F (37.2 C) (09/03 0615) Pulse Rate:  [65-82] 79 (09/03 0615) Resp:  [16-17] 16 (09/03 0615) BP: (141-147)/(58-61) 141/58 (09/03 0615) SpO2:  [87 %-94 %] 90 % (09/03 0615) Last BM Date: 10/23/15 General: Elderly Caucasian female in NAD Heart:  Regular rate and rhythm; no murmurs Lungs: Respirations even and unlabored, lungs CTA bilaterally Abdomen:  Soft, Markedly tender in epigastrum and nondistended. Normal bowel sounds. Extremities:  Without edema. Neurologic:  Alert and oriented,  grossly normal neurologically. Psych:  Cooperative. Normal mood and affect.  Intake/Output from previous day: 09/02 0701 - 09/03 0700 In: 2600.3 [P.O.:360; I.V.:2240.3] Out: -   Lab Results:  Recent Labs  10/22/15 0543 10/23/15 0418 10/24/15 0429  WBC 10.2 17.3* 12.1*  HGB 15.0 13.1 13.2  HCT 41.9 36.9 37.4  PLT PLATELET CLUMPS NOTED ON SMEAR, COUNT APPEARS ADEQUATE 148* 133*   BMET  Recent Labs  10/23/15 0418 10/23/15 1830 10/24/15 0429  NA 134* 133* 134*  K 4.5 3.6 4.0  CL 101 101 101  CO2 28 26 26   GLUCOSE 145* 120* 114*  BUN 17 11 13   CREATININE 0.93 0.64 0.60  CALCIUM 8.3* 8.1* 8.3*   LFT  Recent Labs  10/24/15 0429  PROT 5.7*  ALBUMIN 3.2*  AST 56*  ALT 128*  ALKPHOS 187*  BILITOT 2.8*   PT/INR No results for input(s): LABPROT, INR in the last 72 hours.  Studies/Results: Dg Ercp  Result Date: 10/22/2015 CLINICAL DATA:  Biliary obstruction. EXAM: ERCP TECHNIQUE: Multiple spot images obtained with the fluoroscopic device and submitted for interpretation post-procedure. COMPARISON:   CT of the abdomen on 10/22/2015 FINDINGS: Submitted C-arm images from the endoscopic procedure demonstrate cannulation of the common bile duct with contrast injection demonstrating diffuse dilatation of the visualize common bile duct and intrahepatic ducts. There appears to be focal narrowing of the distal CBD/ampulla. A metallic biliary stent was placed spanning across the distal CBD and extending into the duodenum. IMPRESSION: Diffuse biliary dilatation with suggestion of stricture involving the distal CBD and/or ampulla. A metallic biliary stent was placed. These images were submitted for radiologic interpretation only. Please see the procedural report for the amount of contrast and the fluoroscopy time utilized. Electronically Signed   By: Aletta Edouard M.D.   On: 10/22/2015 17:52       Assessment / Plan:    Assessment: 1. Abdominal Pain:Increased after 2 days of diarrhea at time of admission, now with continued epigastric pain, no better from yesterday due to mild pancreatitis s/p ERCP 2. Diarrhea:Prior to admission, now no more since 9/2 am-C.Diff negative 3. Abnormal CT abdomen:showing likely pancreatic head mass and biliary ductal dilation, ERCP 10/22/15 with bile duct brushing, results pending 4. Elevated LFT's:decreasing since time of ERCP and stent placement on 10/22/15 5. Nausea 6. Hypokalemia: resolved 7. Pancreatitis: post ERCP  Plan: 1. Patient to remain NPO as pain does not seem to be any better at time of exam this morning, will adjust pain medications-it appears they are prn 2. Awaiting cytology results and CEA/Ca-19-9 3. Sx team ordering MRI-per their notes 4. Continued increased fluid  management 5. Please await any further recs from Dr. Silverio Decamp  Thank you for your kind consultation, we will continue to follow   LOS: 2 days   Levin Erp  10/24/2015, 9:34 AM  Pager # [phone]

## 2015-10-24 NOTE — Evaluation (Signed)
Physical Therapy Evaluation Patient Details Name: Erika Conley MRN: [id number] DOB: Oct 03, 1934 Today's Date: 10/24/2015   History of Present Illness  Pt admitted from ALF  with c/o abdominal pain and dx with pancreatic mass.  Pt wtih hx o fo vertigo, fibromyalgia, back sugery and frequent falls  Clinical Impression  Pt admitted as above and presenting with functional mobility limitations 2* generalized weakness, pain, and ambulatory balance deficits.  Pt is resident of ALF and this level of assist would be appropriate with follow up HHPT to further address deficits dependent on acute stay progress.    Follow Up Recommendations Home health PT    Equipment Recommendations  None recommended by PT    Recommendations for Other Services       Precautions / Restrictions Precautions Precautions: Fall Restrictions Weight Bearing Restrictions: No      Mobility  Bed Mobility Overal bed mobility: Needs Assistance Bed Mobility: Supine to Sit     Supine to sit: Min assist;Mod assist     General bed mobility comments: cues and assist to complete log roll and move to sitting at EOB   Transfers Overall transfer level: Needs assistance Equipment used: Rolling walker (2 wheeled) Transfers: Sit to/from Stand Sit to Stand: Mod assist         General transfer comment: cues for safe transition position and use of UEs to self assist.  Ambulation/Gait Ambulation/Gait assistance: Min assist;Mod assist Ambulation Distance (Feet): 140 Feet Assistive device: Rolling walker (2 wheeled) Gait Pattern/deviations: Step-through pattern;Decreased step length - right;Decreased step length - left;Trunk flexed;Shuffle;Wide base of support Gait velocity: decr Gait velocity interpretation: Below normal speed for age/gender General Gait Details: cues for posture and position from RW.  Multiple standing rests required to complete task  Stairs            Wheelchair Mobility    Modified  Rankin (Stroke Patients Only)       Balance Overall balance assessment: Needs assistance Sitting-balance support: No upper extremity supported;Feet supported Sitting balance-Leahy Scale: Good     Standing balance support: Bilateral upper extremity supported Standing balance-Leahy Scale: Poor                               Pertinent Vitals/Pain Pain Assessment: Faces Faces Pain Scale: Hurts little more Pain Location: abdomen with attempts to mobilize OOB Pain Descriptors / Indicators: Grimacing;Guarding Pain Intervention(s): Limited activity within patient's tolerance;Monitored during session;Premedicated before session    Home Living Family/patient expects to be discharged to:: Assisted living               Home Equipment: Walker - 4 wheels;Cane - single point;Bedside commode;Shower seat      Prior Function Level of Independence: Independent with assistive device(s)               Hand Dominance   Dominant Hand: Right    Extremity/Trunk Assessment   Upper Extremity Assessment: Generalized weakness           Lower Extremity Assessment: Generalized weakness      Cervical / Trunk Assessment: Kyphotic  Communication   Communication: No difficulties  Cognition Arousal/Alertness: Lethargic Behavior During Therapy: WFL for tasks assessed/performed Overall Cognitive Status: History of cognitive impairments - at baseline                      General Comments      Exercises  Assessment/Plan    PT Assessment Patient needs continued PT services  PT Diagnosis Difficulty walking   PT Problem List Decreased strength;Decreased activity tolerance;Decreased balance;Decreased mobility;Decreased knowledge of use of DME;Decreased safety awareness;Obesity;Pain  PT Treatment Interventions DME instruction;Gait training;Functional mobility training;Therapeutic activities;Therapeutic exercise;Balance training;Cognitive  remediation;Patient/family education   PT Goals (Current goals can be found in the Care Plan section) Acute Rehab PT Goals Patient Stated Goal: Walk PT Goal Formulation: With patient Time For Goal Achievement: 11/06/15 Potential to Achieve Goals: Fair    Frequency Min 3X/week   Barriers to discharge        Co-evaluation               End of Session Equipment Utilized During Treatment: Gait belt Activity Tolerance: Patient tolerated treatment well;Patient limited by fatigue Patient left: in chair;with call bell/phone within reach;with chair alarm set Nurse Communication: Mobility status         Time: [id number] PT Time Calculation (min) (ACUTE ONLY): 38 min   Charges:   PT Evaluation $PT Eval Low Complexity: 1 Procedure PT Treatments $Gait Training: 23-37 mins   PT G Codes:        Patience Nuzzo 2015/11/22, 5:03 PM

## 2015-10-24 NOTE — Progress Notes (Signed)
Dr. Posey Pronto aware of MRI results. No new order.

## 2015-10-25 DIAGNOSIS — K831 Obstruction of bile duct: Secondary | ICD-10-CM

## 2015-10-25 LAB — CBC WITH DIFFERENTIAL/PLATELET
BASOS ABS: 0 10*3/uL (ref 0.0–0.1)
Basophils Relative: 0 %
Eosinophils Absolute: 0 10*3/uL (ref 0.0–0.7)
Eosinophils Relative: 0 %
HEMATOCRIT: 34.5 % — AB (ref 36.0–46.0)
HEMOGLOBIN: 12.1 g/dL (ref 12.0–15.0)
LYMPHS PCT: 6 %
Lymphs Abs: 0.7 10*3/uL (ref 0.7–4.0)
MCH: 29.3 pg (ref 26.0–34.0)
MCHC: 35.1 g/dL (ref 30.0–36.0)
MCV: 83.5 fL (ref 78.0–100.0)
Monocytes Absolute: 1.2 10*3/uL — ABNORMAL HIGH (ref 0.1–1.0)
Monocytes Relative: 9 %
NEUTROS ABS: 10.6 10*3/uL — AB (ref 1.7–7.7)
NEUTROS PCT: 85 %
PLATELETS: 134 10*3/uL — AB (ref 150–400)
RBC: 4.13 MIL/uL (ref 3.87–5.11)
RDW: 13 % (ref 11.5–15.5)
WBC: 12.5 10*3/uL — AB (ref 4.0–10.5)

## 2015-10-25 LAB — COMPREHENSIVE METABOLIC PANEL
ALT: 81 U/L — ABNORMAL HIGH (ref 14–54)
ANION GAP: 7 (ref 5–15)
AST: 30 U/L (ref 15–41)
Albumin: 2.8 g/dL — ABNORMAL LOW (ref 3.5–5.0)
Alkaline Phosphatase: 163 U/L — ABNORMAL HIGH (ref 38–126)
BILIRUBIN TOTAL: 2.8 mg/dL — AB (ref 0.3–1.2)
BUN: 14 mg/dL (ref 6–20)
CO2: 27 mmol/L (ref 22–32)
Calcium: 8.2 mg/dL — ABNORMAL LOW (ref 8.9–10.3)
Chloride: 101 mmol/L (ref 101–111)
Creatinine, Ser: 0.56 mg/dL (ref 0.44–1.00)
GFR calc Af Amer: >60 mL/min (ref >60)
Glucose, Bld: 89 mg/dL (ref 65–99)
POTASSIUM: 3.8 mmol/L (ref 3.5–5.1)
Sodium: 135 mmol/L (ref 135–145)
TOTAL PROTEIN: 5.5 g/dL — AB (ref 6.5–8.1)

## 2015-10-25 LAB — LIPASE, BLOOD: Lipase: 148 U/L — ABNORMAL HIGH (ref 11–51)

## 2015-10-25 LAB — MAGNESIUM: Magnesium: 1.4 mg/dL — ABNORMAL LOW (ref 1.7–2.4)

## 2015-10-25 MED ORDER — MAGNESIUM SULFATE 2 GM/50ML IV SOLN: 2.0000 g | Freq: Once | INTRAVENOUS | Status: DC

## 2015-10-25 MED ORDER — MAGNESIUM SULFATE 2 GM/50ML IV SOLN
2.0000 g | Freq: Once | INTRAVENOUS | Status: AC
  Administered 2015-10-25: 2 g via INTRAVENOUS
  Filled 2015-10-25: qty 50

## 2015-10-25 MED ORDER — OXYCODONE HCL 5 MG PO TABS
2.5000 mg | ORAL_TABLET | ORAL | Status: DC | PRN
  Administered 2015-10-25 – 2015-10-27 (×10): 5 mg via ORAL
  Filled 2015-10-25 (×10): qty 1

## 2015-10-25 NOTE — Progress Notes (Signed)
[facility] Progress Note  Patient: Erika Conley [id number]   PCP: Leonard Downing, MD DOB: 11-18-1934   DOA: 10/22/2015   DOS: 10/25/2015   Date of Service: the patient was seen and examined on 10/25/2015  Subjective: No BM. Patient was switched to clear liquid diet but after initiating clear liquid diet patient completed about increasing complaints of abdominal pain as well as increasing bloating and then transition back to nothing by mouth. Nutrition: Remains nothing by mouth at present  Brief hospital course: Pt. with PMH of GERD, fibromyalgia; admitted on 10/22/2015, with complaint of abdominal pain, was found to have pancreatic mass with cholestatic jaundice. 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 Gen. surgery was also consulted for pancreatic mass and workup is currently undergoing. On 10/23/2015 patient developed pancreatitis from ERCP. Currently further plan is continue supportive care and workup for pancreatic mass..  Assessment and Plan: 1. Pancreatic mass CT scan is showing pancreatic mass. Gastroenterology consulted general surgery consulted. Patient is status post ERCP with pancreatic stent placement for cholestatic jaundice. Patient denies having any weight loss. CEA and CA-19-9 are currently pending. We will monitor her biopsy result as well. MRI abdomen Negative for any pulmonary mass. Patient will require outpatient EUS.  2. Cholestatic jaundice. Abnormal LFT. Avoid hepatotoxic medication. Continues to show Improvement after pancreatic stent placement.  3. Post ERCP pancreatitis. Patient is developing significant abdominal pain, lipase level is elevated mildly. Appears to be developing post ERCP pancreatitis. Patient was given indomethacin per protocol postprocedure.  Continue IV ringer lactate. Pain management regimen changed to Dilaudid and OxyIR. Nothing by mouth with supportive  treatment. Appreciate GI evaluation.  4. Hypothyroidism. Continue Synthroid.  5. One episode of loose watery bowel movement. No further bowel movement to suggest any concern for C. difficile. D/C enteric-precaution.  Pain management: IV Dilaudid, OxyIR When necessary Activity: Consulted physical therapy Home health recommended Bowel regimen: last BM 10/24/2015 Diet: Nothing by mouth DVT Prophylaxis: subcutaneous Heparin  Advance goals of care discussion: DNR/DNI  Family Communication: no family was present at bedside, at the time of interview.  Disposition:  Discharge to home. Expected discharge date: 10/27/2015, improvement in abdominal symptoms as well as further workup for the pancreatic mass  Consultants: Gastroenterology, general surgery Procedures: ERCP and stent placement  Antibiotics: Anti-infectives    Start     Dose/Rate Route Frequency Ordered Stop   10/22/15 1545  Ampicillin-Sulbactam (UNASYN) 3 g in sodium chloride 0.9 % 100 mL IVPB     3 g 100 mL/hr over 60 Minutes Intravenous  Once 10/22/15 1538 10/22/15 1655        Intake/Output Summary (Last 24 hours) at 10/25/15 1624 Last data filed at 10/25/15 1515  Gross per 24 hour  Intake           3127.5 ml  Output              175 ml  Net           2952.5 ml   Filed Weights   10/22/15 0334 10/22/15 1025 10/22/15 1520  Weight: 68 kg (150 lb) 67.8 kg (149 lb 7.6 oz) 67.8 kg (149 lb 7.6 oz)    Objective: Physical Exam: Vitals:   10/25/15 0400 10/25/15 0445 10/25/15 0450 10/25/15 1304  BP:   (!) 151/68 (!) 142/57  Pulse:   77 72  Resp:   16 14  Temp: 98.6 F (37 C)  98.7 F (37.1 C)  98.6 F (37 C)  TempSrc:   Oral Oral  SpO2:  (!) 89% 91% 97%  Weight:      Height:        General: Alert, Awake and Oriented to Time, Place and Person. Appear in moderate distress Eyes: PERRL, Conjunctiva normal ENT: Oral Mucosa clear moist. Neck: no JVD, no Abnormal Mass Or lumps Cardiovascular: S1 and S2 Present,  aortic systolic Murmur, Respiratory: Bilateral Air entry equal and Decreased, Clear to Auscultation, no Crackles, no wheezes Abdomen: Bowel Sound present, Soft and tenderness present Skin: no redness, no Rash  Extremities: no Pedal edema, no calf tenderness  Data Reviewed: CBC:  Recent Labs Lab 10/22/15 0543 10/23/15 0418 10/24/15 0429 10/25/15 0529  WBC 10.2 17.3* 12.1* 12.5*  NEUTROABS 8.4*  --  10.8* 10.6*  HGB 15.0 13.1 13.2 12.1  HCT 41.9 36.9 37.4 34.5*  MCV 83.3 84.1 82.7 83.5  PLT PLATELET CLUMPS NOTED ON SMEAR, COUNT APPEARS ADEQUATE 148* 133* [id number]*   Basic Metabolic Panel:  Recent Labs Lab 10/22/15 0543 10/23/15 0418 10/23/15 1830 10/24/15 0429 10/25/15 0529  NA 133* 134* 133* 134* 135  K 3.2* 4.5 3.6 4.0 3.8  CL 95* 101 101 101 101  CO2 29 28 26 26 27   GLUCOSE 126* 145* 120* 114* 89  BUN 11 17 11 13 14   CREATININE 0.64 0.93 0.64 0.60 0.56  CALCIUM 9.2 8.3* 8.1* 8.3* 8.2*  MG  --   --   --   --  1.4*    Liver Function Tests:  Recent Labs Lab 10/22/15 0543 10/23/15 0418 10/24/15 0429 10/25/15 0529  AST 471* 137* 56* 30  ALT 375* 196* 128* 81*  ALKPHOS 263* 202* 187* 163*  BILITOT 8.0* 5.4* 2.8* 2.8*  PROT 6.9 5.5* 5.7* 5.5*  ALBUMIN 4.2 3.2* 3.2* 2.8*    Recent Labs Lab 10/22/15 0543 10/23/15 0418 10/25/15 0529  LIPASE 135* 566* 148*   No results for input(s): AMMONIA in the last 168 hours. Coagulation Profile: No results for input(s): INR, PROTIME in the last 168 hours. Cardiac Enzymes: No results for input(s): CKTOTAL, CKMB, CKMBINDEX, TROPONINI in the last 168 hours. BNP (last 3 results) No results for input(s): PROBNP in the last 8760 hours.  CBG: No results for input(s): GLUCAP in the last 168 hours.  Studies: No results found.   Scheduled Meds: . antiseptic oral rinse  15 mL Mouth Rinse TID  . calcium-vitamin D  1 tablet Oral Daily  . enoxaparin (LOVENOX) injection  40 mg Subcutaneous Q24H  . fluticasone  2 spray Each  Nare Daily  . levothyroxine  112 mcg Oral QAC breakfast  . methocarbamol  1,000 mg Oral TID  . multivitamin  1 tablet Oral BID  . pantoprazole  40 mg Oral BID  . traZODone  200 mg Oral QHS   Continuous Infusions: . lactated ringers 150 mL/hr at 10/25/15 1515   PRN Meds: acetaminophen **OR** acetaminophen, aluminum hydroxide-magnesium carbonate, benzocaine-resorcinol, bismuth subsalicylate, dextromethorphan, HYDROmorphone (DILAUDID) injection, hydrOXYzine, lip balm, menthol-cetylpyridinium, ondansetron **OR** ondansetron (ZOFRAN) IV, oxyCODONE, polyvinyl alcohol, tobramycin  Time spent: 30 minutes  Author: Berle Mull, MD [facility] Pager: [phone] 10/25/2015 4:24 PM  If 7PM-7AM, please contact night-coverage at [email or web address], password [facility]

## 2015-10-25 NOTE — Progress Notes (Signed)
Progress Note   Subjective   Chief Complaint: Abdominal Pain, Elevated LFT's, Jaundice, Pancreatic mass?  Pt s/p ERCP with stent placement 10/22/15, this morning doing much better, pain improved to a 5/10 currently, pt requesting to try to eat something. We did discuss that MRI tentatively shows no mass, though there was a lot of motion at time of study. No BM since 10/23/15.    Objective   Vital signs in last 24 hours: Temp:  [98.6 F (37 C)-100.3 F (37.9 C)] 98.7 F (37.1 C) (09/04 0450) Pulse Rate:  [68-77] 77 (09/04 0450) Resp:  [16-18] 16 (09/04 0450) BP: (100-151)/(57-69) 151/68 (09/04 0450) SpO2:  [89 %-93 %] 91 % (09/04 0450) Last BM Date: 10/23/15 General: Elderly Caucasian female in NAD Heart:  Regular rate and rhythm; no murmurs Lungs: Respirations even and unlabored, lungs CTA bilaterally Abdomen:  Soft, mild ttp in epigastrum and nondistended. Normal bowel sounds. Extremities:  Without edema. Neurologic:  Alert and oriented,  grossly normal neurologically. Psych:  Cooperative. Normal mood and affect.  Intake/Output from previous day: 09/03 0701 - 09/04 0700 In: 1270 [I.V.:1270] Out: 175 [Urine:175]  Lab Results:  Recent Labs  10/23/15 0418 10/24/15 0429 10/25/15 0529  WBC 17.3* 12.1* 12.5*  HGB 13.1 13.2 12.1  HCT 36.9 37.4 34.5*  PLT 148* 133* 134*   BMET  Recent Labs  10/23/15 1830 10/24/15 0429 10/25/15 0529  NA 133* 134* 135  K 3.6 4.0 3.8  CL 101 101 101  CO2 26 26 27   GLUCOSE 120* 114* 89  BUN 11 13 14   CREATININE 0.64 0.60 0.56  CALCIUM 8.1* 8.3* 8.2*   LFT  Recent Labs  10/25/15 0529  PROT 5.5*  ALBUMIN 2.8*  AST 30  ALT 81*  ALKPHOS 163*  BILITOT 2.8*   PT/INR No results for input(s): LABPROT, INR in the last 72 hours.  Studies/Results: Mr 3d Recon At Scanner  Result Date: 10/24/2015 CLINICAL DATA:  Abdominal pain, elevated lipase, jaundice, possible pancreatic mass on CT EXAM: MRI ABDOMEN WITHOUT AND WITH CONTRAST  (INCLUDING MRCP) TECHNIQUE: Multiplanar multisequence MR imaging of the abdomen was performed both before and after the administration of intravenous contrast. Heavily T2-weighted images of the biliary and pancreatic ducts were obtained, and three-dimensional MRCP images were rendered by post processing. CONTRAST:  39mL MULTIHANCE GADOBENATE DIMEGLUMINE 529 MG/ML IV SOLN COMPARISON:  CT abdomen pelvis dated 10/22/2015 FINDINGS: Motion degraded images. Lower chest: Small right and trace left pleural effusions. Associated dependent atelectasis at the lung bases. Hepatobiliary: Mild hepatic steatosis. No suspicious/enhancing hepatic lesion. Status post cholecystectomy. Associated intrahepatic/extrahepatic ductal dilatation. Common duct measures 12 mm. Distal CBD is poorly evaluated due to motion degradation. However, a 4 mm distal CBD stone is possible (series 4/ image 34), although equivocal. Pancreas: No focal pancreatic mass, ductal dilatation, or atrophy is visualized. Mild peripancreatic stranding/inflammatory changes along the pancreatic head/ uncinate process and pancreatic tail, suggesting acute pancreatitis. No peripancreatic fluid collection/pseudocyst. Postcontrast imaging is severely motion degraded. Spleen: Within normal limits. Adrenals/Urinary Tract: Adrenal glands are within normal limits. Kidneys are within normal limits.  No hydronephrosis. Stomach/Bowel: Stomach is within normal limits. Visualized bowel is unremarkable. Vascular/Lymphatic: No evidence of abdominal aortic aneurysm. No suspicious abdominopelvic lymphadenopathy. Small upper abdominal lymph nodes in the porta hepatis, likely reactive. Other: Mild stranding along the porta hepatis. Musculoskeletal: No focal osseous lesions. IMPRESSION: Severely motion degraded images. Suspected acute pancreatitis. No peripancreatic fluid collection/pseudocyst. While postcontrast imaging is severely motion degraded, there are no  secondary findings  suggestive of a pancreatic mass. Status post cholecystectomy. Associated intrahepatic and extrahepatic ductal dilatation. Common duct measures 12 mm. Possible 4 mm distal CBD stone, although equivocal/poorly evaluated due to motion degradation. Consider ERCP as clinically warranted. Electronically Signed   By: Julian Hy M.D.   On: 10/24/2015 12:25   Mr Jeananne Rama W/wo Cm/mrcp  Result Date: 10/24/2015 CLINICAL DATA:  Abdominal pain, elevated lipase, jaundice, possible pancreatic mass on CT EXAM: MRI ABDOMEN WITHOUT AND WITH CONTRAST (INCLUDING MRCP) TECHNIQUE: Multiplanar multisequence MR imaging of the abdomen was performed both before and after the administration of intravenous contrast. Heavily T2-weighted images of the biliary and pancreatic ducts were obtained, and three-dimensional MRCP images were rendered by post processing. CONTRAST:  63mL MULTIHANCE GADOBENATE DIMEGLUMINE 529 MG/ML IV SOLN COMPARISON:  CT abdomen pelvis dated 10/22/2015 FINDINGS: Motion degraded images. Lower chest: Small right and trace left pleural effusions. Associated dependent atelectasis at the lung bases. Hepatobiliary: Mild hepatic steatosis. No suspicious/enhancing hepatic lesion. Status post cholecystectomy. Associated intrahepatic/extrahepatic ductal dilatation. Common duct measures 12 mm. Distal CBD is poorly evaluated due to motion degradation. However, a 4 mm distal CBD stone is possible (series 4/ image 34), although equivocal. Pancreas: No focal pancreatic mass, ductal dilatation, or atrophy is visualized. Mild peripancreatic stranding/inflammatory changes along the pancreatic head/ uncinate process and pancreatic tail, suggesting acute pancreatitis. No peripancreatic fluid collection/pseudocyst. Postcontrast imaging is severely motion degraded. Spleen: Within normal limits. Adrenals/Urinary Tract: Adrenal glands are within normal limits. Kidneys are within normal limits.  No hydronephrosis. Stomach/Bowel: Stomach is  within normal limits. Visualized bowel is unremarkable. Vascular/Lymphatic: No evidence of abdominal aortic aneurysm. No suspicious abdominopelvic lymphadenopathy. Small upper abdominal lymph nodes in the porta hepatis, likely reactive. Other: Mild stranding along the porta hepatis. Musculoskeletal: No focal osseous lesions. IMPRESSION: Severely motion degraded images. Suspected acute pancreatitis. No peripancreatic fluid collection/pseudocyst. While postcontrast imaging is severely motion degraded, there are no secondary findings suggestive of a pancreatic mass. Status post cholecystectomy. Associated intrahepatic and extrahepatic ductal dilatation. Common duct measures 12 mm. Possible 4 mm distal CBD stone, although equivocal/poorly evaluated due to motion degradation. Consider ERCP as clinically warranted. Electronically Signed   By: Julian Hy M.D.   On: 10/24/2015 12:25       Assessment / Plan:    Assessment: 1. Abdominal Pain: Increased after 2 days of diarrhea at time of admission, now with continued epigastric pain, no better from yesterday due to mild pancreatitis s/p ERCP 2. Diarrhea:Prior to admission, now no more since 9/2 am-C.Diff negative 3. Abnormal CT abdomen:showing likely pancreatic head mass and biliary ductal dilation, ERCP 10/22/15 with bile duct brushing, results pending-MRI does not show pancreatic mass-plans for EUS outpatient for further eval 4. Elevated LFT's:decreasing since time of ERCP and stent placement on 10/22/15 5. Nausea 6. Hypokalemia: resolved 7. Pancreatitis: post ERCP  Plan: 1. Increased diet to clear liquids this morning 2. Awaiting cytology results and CEA/Ca-19-9 3. CT questions mass, MRI says no mass-plans for pt to follow up outpt for EUS for further eval 4. Continue supportive measures 5. Please await any further recs from Dr. Silverio Decamp  Thank you for your kind consultation, we will continue to follow    LOS: 3 days   Erika Conley  10/25/2015, 9:18 AM  Pager # [phone]

## 2015-10-25 NOTE — Progress Notes (Addendum)
Physical Therapy Treatment Patient Details Name: Erika Conley MRN: [id number] DOB: 03-Feb-1935 Today's Date: 10/25/2015    History of Present Illness Pt admitted from ALF  with c/o abdominal pain and dx with pancreatic mass.  Pt wtih hx o fo vertigo, fibromyalgia, back sugery and frequent falls    PT Comments    Pt up in recliner on arrival and agreeable to ambulate however upon standing, pt reported dizziness and nausea and requested return to supine.  Pt from ALF and states she typically does not require much assist from staff.  Pt may need ST-SNF upon d/c.   Follow Up Recommendations  SNF;Supervision for mobility/OOB     Equipment Recommendations  None recommended by PT    Recommendations for Other Services       Precautions / Restrictions Precautions Precautions: Fall    Mobility  Bed Mobility Overal bed mobility: Needs Assistance Bed Mobility: Sit to Supine       Sit to supine: Mod assist   General bed mobility comments: assist for LEs  Transfers Overall transfer level: Needs assistance Equipment used: Rolling walker (2 wheeled) Transfers: Sit to/from [facility] Sit to Stand: Min assist Stand pivot transfers: Min assist       General transfer comment: verbal cues for safe technique, assist to rise and steady, assist for RW with turning, pt felt unable to tolerate ambulation today due to dizziness and requested return to supine  Removed O2 [place] for transfer and pt with SpO2 88% room air upon return to bed so reapplied O2 [place].  Ambulation/Gait                 Stairs            Wheelchair Mobility    Modified Rankin (Stroke Patients Only)       Balance                                    Cognition Arousal/Alertness: Awake/alert Behavior During Therapy: WFL for tasks assessed/performed Overall Cognitive Status: History of cognitive impairments - at baseline                      Exercises       General Comments        Pertinent Vitals/Pain Pain Assessment: Faces Faces Pain Scale: Hurts even more Pain Location: abdomen Pain Descriptors / Indicators: Aching Pain Intervention(s): Limited activity within patient's tolerance;Monitored during session;Repositioned    Home Living                      Prior Function            PT Goals (current goals can now be found in the care plan section) Progress towards PT goals: Progressing toward goals    Frequency  Min 3X/week    PT Plan Discharge plan needs to be updated    Co-evaluation             End of Session Equipment Utilized During Treatment: Gait belt Activity Tolerance: Patient limited by fatigue;Other (comment) (dizziness) Patient left: with call bell/phone within reach;with bed alarm set;in bed     Time: 1145-1157 PT Time Calculation (min) (ACUTE ONLY): 12 min  Charges:  $Therapeutic Activity: 8-22 mins                    G Codes:  Davette Nugent,KATHrine E 10/25/2015, 1:39 PM Carmelia Bake, PT, DPT 10/25/2015 Pager: [phone]

## 2015-10-25 NOTE — Progress Notes (Signed)
Nursing Note: On am vitals check,PO2 88-89 % on r/a.Pt was asleep prior to check.A; Oxygen@1L  n/c applied.Sats up to 91 %.wbb

## 2015-10-25 NOTE — Progress Notes (Signed)
Pt c/o increased bloating and pain after drinking clear liquids.  Dr. Posey Pronto made aware.  Orders changed back to NPO.  Will continue to monitor closely.

## 2015-10-26 ENCOUNTER — Encounter (HOSPITAL_COMMUNITY): Payer: Self-pay | Admitting: Gastroenterology

## 2015-10-26 ENCOUNTER — Inpatient Hospital Stay (HOSPITAL_COMMUNITY): Payer: Medicare Other

## 2015-10-26 LAB — COMPREHENSIVE METABOLIC PANEL
ALT: 57 U/L — ABNORMAL HIGH (ref 14–54)
AST: 23 U/L (ref 15–41)
Albumin: 2.7 g/dL — ABNORMAL LOW (ref 3.5–5.0)
Alkaline Phosphatase: 154 U/L — ABNORMAL HIGH (ref 38–126)
Anion gap: 8 (ref 5–15)
BUN: 10 mg/dL (ref 6–20)
CO2: 28 mmol/L (ref 22–32)
CREATININE: 0.71 mg/dL (ref 0.44–1.00)
Calcium: 8.1 mg/dL — ABNORMAL LOW (ref 8.9–10.3)
Chloride: 99 mmol/L — ABNORMAL LOW (ref 101–111)
GLUCOSE: 104 mg/dL — AB (ref 65–99)
Potassium: 3.5 mmol/L (ref 3.5–5.1)
Sodium: 135 mmol/L (ref 135–145)
TOTAL PROTEIN: 5.7 g/dL — AB (ref 6.5–8.1)
Total Bilirubin: 2.1 mg/dL — ABNORMAL HIGH (ref 0.3–1.2)

## 2015-10-26 LAB — CBC WITH DIFFERENTIAL/PLATELET
Basophils Absolute: 0 10*3/uL (ref 0.0–0.1)
Basophils Relative: 0 %
EOS PCT: 0 %
Eosinophils Absolute: 0 10*3/uL (ref 0.0–0.7)
HCT: 33.3 % — ABNORMAL LOW (ref 36.0–46.0)
Hemoglobin: 11.7 g/dL — ABNORMAL LOW (ref 12.0–15.0)
LYMPHS ABS: 1 10*3/uL (ref 0.7–4.0)
LYMPHS PCT: 8 %
MCH: 29.2 pg (ref 26.0–34.0)
MCHC: 35.1 g/dL (ref 30.0–36.0)
MCV: 83 fL (ref 78.0–100.0)
MONO ABS: 1.2 10*3/uL — AB (ref 0.1–1.0)
Monocytes Relative: 9 %
Neutro Abs: 10.7 10*3/uL — ABNORMAL HIGH (ref 1.7–7.7)
Neutrophils Relative %: 83 %
PLATELETS: 152 10*3/uL (ref 150–400)
RBC: 4.01 MIL/uL (ref 3.87–5.11)
RDW: 12.9 % (ref 11.5–15.5)
WBC: 12.9 10*3/uL — ABNORMAL HIGH (ref 4.0–10.5)

## 2015-10-26 LAB — MAGNESIUM: MAGNESIUM: 1.8 mg/dL (ref 1.7–2.4)

## 2015-10-26 LAB — CANCER ANTIGEN 19-9: CA 19-9: 119 U/mL — ABNORMAL HIGH (ref 0–35)

## 2015-10-26 LAB — CEA: CEA: 4.8 ng/mL — ABNORMAL HIGH (ref 0.0–4.7)

## 2015-10-26 NOTE — NC FL2 (Signed)
[place] LEVEL OF CARE SCREENING TOOL     IDENTIFICATION  Patient Name: Erika Conley Birthdate: 1934-12-31 Sex: female Admission Date (Current Location): 10/22/2015  [facility] and [place] Number:  Herbalist and Address:  [facility],  [place] [address] [place]      Provider Number: [id number]  Attending Physician Name and Address:  Lavina Hamman, MD  Relative Name and Phone Number:       Current Level of Care: Hospital Recommended Level of Care: [place] Prior Approval Number:    Date Approved/Denied:   PASRR Number:   [id number]  Discharge Plan: SNF    Current Diagnoses: Patient Active Problem List   Diagnosis Date Noted  . Common bile duct (CBD) stricture   . Pancreatitis 10/23/2015  . Abdominal pain 10/22/2015  . Pancreatic mass 10/22/2015  . Common bile duct (CBD) obstruction   . Abnormal LFTs   . Dehydration with hyponatremia 04/09/2015  . Acute respiratory failure with hypoxia ([place]) 04/09/2015  . Thrombocytopenia ([place]) 04/09/2015  . Hypokalemia 11/10/2014  . Hypertension 11/10/2014  . Hypothyroidism 11/10/2014  . Syncope due to orthostatic hypotension 11/10/2014  . Constipated   . UTI (urinary tract infection) 06/04/2013  . Altered mental status 06/04/2013    Orientation RESPIRATION BLADDER Height & Weight     Self, Time, Situation, Place  Normal Continent Weight: 149 lb 7.6 oz (67.8 kg) Height:  5\' 1"  (154.9 cm)  BEHAVIORAL SYMPTOMS/MOOD NEUROLOGICAL BOWEL NUTRITION STATUS   (NONE )  (NONE ) Continent Diet (currently clear liquids )  AMBULATORY STATUS COMMUNICATION OF NEEDS Skin   Limited Assist Verbally Normal                       Personal Care Assistance Level of Assistance  Dressing, Bathing, Feeding Bathing Assistance: Limited assistance Feeding assistance: Limited assistance Dressing Assistance: Limited assistance     Functional Limitations Info  Speech, Hearing,  Sight Sight Info: Adequate Hearing Info: Adequate Speech Info: Adequate    SPECIAL CARE FACTORS FREQUENCY  PT (By licensed PT)     PT Frequency: 3              Contractures Contractures Info: Not present    Additional Factors Info  Code Status, Allergies Code Status Info: DNR CODE  Allergies Info: Codeine, Cortisone, Other            Current Medications (10/26/2015):  This is the current hospital active medication list Current Facility-Administered Medications  Medication Dose Route Frequency Provider Last Rate Last Dose  . acetaminophen (TYLENOL) tablet 650 mg  650 mg Oral Q6H PRN Tawni Millers, MD       Or  . acetaminophen (TYLENOL) suppository 650 mg  650 mg Rectal Q6H PRN Tawni Millers, MD   650 mg at 10/22/15 1842  . aluminum hydroxide-magnesium carbonate (GAVISCON) 95-358 MG/15ML suspension 10 mL  10 mL Oral Q6H PRN Tawni Millers, MD      . antiseptic oral rinse (BIOTENE) solution 15 mL  15 mL Mouth Rinse TID Tawni Millers, MD   15 mL at 10/25/15 2130  . bismuth subsalicylate (PEPTO BISMOL) 262 MG/15ML suspension 30 mL  30 mL Oral Q4H PRN Mauricio Gerome Apley, MD      . calcium-vitamin D (OSCAL WITH D) 500-200 MG-UNIT per tablet 1 tablet  1 tablet Oral Daily Mauricio Gerome Apley, MD   1 tablet at 10/25/15 0904  .  dextromethorphan (DELSYM) 30 MG/5ML liquid 60 mg  10 mL Oral Q12H PRN Tawni Millers, MD      . enoxaparin (LOVENOX) injection 40 mg  40 mg Subcutaneous Q24H Tawni Millers, MD   40 mg at 10/25/15 2130  . fluticasone (FLONASE) 50 MCG/ACT nasal spray 2 spray  2 spray Each Nare Daily Mauricio Gerome Apley, MD   2 spray at 10/25/15 0909  . HYDROmorphone (DILAUDID) injection 0.5 mg  0.5 mg Intravenous Q3H PRN Lavina Hamman, MD   0.5 mg at 10/25/15 1513  . hydrOXYzine (ATARAX/VISTARIL) tablet 25 mg  25 mg Oral TID PRN Lavina Hamman, MD   25 mg at 10/23/15 1410  . lactated ringers infusion   Intravenous  Continuous Lavina Hamman, MD 75 mL/hr at 10/26/15 [phone]    . levothyroxine (SYNTHROID, LEVOTHROID) tablet 112 mcg  112 mcg Oral QAC breakfast Tawni Millers, MD   112 mcg at 10/26/15 0818  . lip balm (CARMEX) ointment   Topical PRN Tawni Millers, MD      . menthol-cetylpyridinium (CEPACOL) lozenge 3 mg  1 lozenge Oral PRN Levin Erp, PA   3 mg at 10/23/15 2158  . methocarbamol (ROBAXIN) tablet 1,000 mg  1,000 mg Oral TID Tawni Millers, MD   1,000 mg at 10/25/15 2130  . multivitamin (PROSIGHT) tablet 1 tablet  1 tablet Oral BID Tawni Millers, MD   1 tablet at 10/25/15 2130  . ondansetron (ZOFRAN) tablet 4 mg  4 mg Oral Q6H PRN Tawni Millers, MD       Or  . ondansetron [facility]) injection 4 mg  4 mg Intravenous Q6H PRN Tawni Millers, MD   4 mg at 10/23/15 1047  . oxyCODONE (Oxy IR/ROXICODONE) immediate release tablet 2.5-5 mg  2.5-5 mg Oral Q3H PRN Lavina Hamman, MD   5 mg at 10/26/15 0818  . pantoprazole (PROTONIX) EC tablet 40 mg  40 mg Oral BID Tawni Millers, MD   40 mg at 10/25/15 2130  . polyvinyl alcohol (LIQUIFILM TEARS) 1.4 % ophthalmic solution 1 drop  1 drop Both Eyes Q4H PRN Mauricio Gerome Apley, MD      . tobramycin (TOBREX) 0.3 % ophthalmic solution 1 drop  1 drop Right Eye QID PRN Tawni Millers, MD      . traZODone (DESYREL) tablet 200 mg  200 mg Oral QHS Tawni Millers, MD   200 mg at 10/25/15 2130     Discharge Medications: Please see discharge summary for a list of discharge medications.  Relevant Imaging Results:  Relevant Lab Results:   Additional Information SSN [id number]  [name]

## 2015-10-26 NOTE — Progress Notes (Signed)
Progress Note   Subjective  Chief Complaint: Abdominal Pain, Elevated LFT's, Jaundice, Pancreatic mass?  Pt feeling better this morning, explains that she tried clear liquid diet yesterday, but this worsened her epigastric pain and she was placed back on NPO. This morning she had her first BM in 3-4 days and tells me that her abdominal pain is now a 2-3/10. Overall she feels much better and is willing to try to eat again.    Objective   Vital signs in last 24 hours: Temp:  [98.2 F (36.8 C)-98.6 F (37 C)] 98.2 F (36.8 C) (09/05 0505) Pulse Rate:  [72-76] 76 (09/05 0505) Resp:  [14] 14 (09/05 0505) BP: (142-157)/(57-63) 157/63 (09/05 0505) SpO2:  [90 %-97 %] 90 % (09/05 0505) Last BM Date: 10/23/15 General:  Elderly Caucasian female in NAD Heart:  Regular rate and rhythm; no murmurs Lungs: Respirations even and unlabored, lungs CTA bilaterally Abdomen:  Soft, mild ttp in epigastrum and nondistended. Normal bowel sounds. Extremities:  Without edema. Neurologic:  Alert and oriented,  grossly normal neurologically. Psych:  Cooperative. Normal mood and affect.  Intake/Output from previous day: 09/04 0701 - 09/05 0700 In: 5340 [P.O.:120; I.V.:5220] Out: -   Lab Results:  Recent Labs  10/24/15 0429 10/25/15 0529 10/26/15 0420  WBC 12.1* 12.5* 12.9*  HGB 13.2 12.1 11.7*  HCT 37.4 34.5* 33.3*  PLT 133* 134* 152   BMET  Recent Labs  10/24/15 0429 10/25/15 0529 10/26/15 0420  NA 134* 135 135  K 4.0 3.8 3.5  CL 101 101 99*  CO2 26 27 28   GLUCOSE 114* 89 104*  BUN 13 14 10   CREATININE 0.60 0.56 0.71  CALCIUM 8.3* 8.2* 8.1*   LFT  Recent Labs  10/26/15 0420  PROT 5.7*  ALBUMIN 2.7*  AST 23  ALT 57*  ALKPHOS 154*  BILITOT 2.1*   PT/INR No results for input(s): LABPROT, INR in the last 72 hours.  Studies/Results: Mr 3d Recon At Scanner  Result Date: 10/24/2015 CLINICAL DATA:  Abdominal pain, elevated lipase, jaundice, possible pancreatic mass on CT  EXAM: MRI ABDOMEN WITHOUT AND WITH CONTRAST (INCLUDING MRCP) TECHNIQUE: Multiplanar multisequence MR imaging of the abdomen was performed both before and after the administration of intravenous contrast. Heavily T2-weighted images of the biliary and pancreatic ducts were obtained, and three-dimensional MRCP images were rendered by post processing. CONTRAST:  31mL MULTIHANCE GADOBENATE DIMEGLUMINE 529 MG/ML IV SOLN COMPARISON:  CT abdomen pelvis dated 10/22/2015 FINDINGS: Motion degraded images. Lower chest: Small right and trace left pleural effusions. Associated dependent atelectasis at the lung bases. Hepatobiliary: Mild hepatic steatosis. No suspicious/enhancing hepatic lesion. Status post cholecystectomy. Associated intrahepatic/extrahepatic ductal dilatation. Common duct measures 12 mm. Distal CBD is poorly evaluated due to motion degradation. However, a 4 mm distal CBD stone is possible (series 4/ image 34), although equivocal. Pancreas: No focal pancreatic mass, ductal dilatation, or atrophy is visualized. Mild peripancreatic stranding/inflammatory changes along the pancreatic head/ uncinate process and pancreatic tail, suggesting acute pancreatitis. No peripancreatic fluid collection/pseudocyst. Postcontrast imaging is severely motion degraded. Spleen: Within normal limits. Adrenals/Urinary Tract: Adrenal glands are within normal limits. Kidneys are within normal limits.  No hydronephrosis. Stomach/Bowel: Stomach is within normal limits. Visualized bowel is unremarkable. Vascular/Lymphatic: No evidence of abdominal aortic aneurysm. No suspicious abdominopelvic lymphadenopathy. Small upper abdominal lymph nodes in the porta hepatis, likely reactive. Other: Mild stranding along the porta hepatis. Musculoskeletal: No focal osseous lesions. IMPRESSION: Severely motion degraded images. Suspected acute pancreatitis. No peripancreatic  fluid collection/pseudocyst. While postcontrast imaging is severely motion  degraded, there are no secondary findings suggestive of a pancreatic mass. Status post cholecystectomy. Associated intrahepatic and extrahepatic ductal dilatation. Common duct measures 12 mm. Possible 4 mm distal CBD stone, although equivocal/poorly evaluated due to motion degradation. Consider ERCP as clinically warranted. Electronically Signed   By: Julian Hy M.D.   On: 10/24/2015 12:25   Mr Jeananne Rama W/wo Cm/mrcp  Result Date: 10/24/2015 CLINICAL DATA:  Abdominal pain, elevated lipase, jaundice, possible pancreatic mass on CT EXAM: MRI ABDOMEN WITHOUT AND WITH CONTRAST (INCLUDING MRCP) TECHNIQUE: Multiplanar multisequence MR imaging of the abdomen was performed both before and after the administration of intravenous contrast. Heavily T2-weighted images of the biliary and pancreatic ducts were obtained, and three-dimensional MRCP images were rendered by post processing. CONTRAST:  66mL MULTIHANCE GADOBENATE DIMEGLUMINE 529 MG/ML IV SOLN COMPARISON:  CT abdomen pelvis dated 10/22/2015 FINDINGS: Motion degraded images. Lower chest: Small right and trace left pleural effusions. Associated dependent atelectasis at the lung bases. Hepatobiliary: Mild hepatic steatosis. No suspicious/enhancing hepatic lesion. Status post cholecystectomy. Associated intrahepatic/extrahepatic ductal dilatation. Common duct measures 12 mm. Distal CBD is poorly evaluated due to motion degradation. However, a 4 mm distal CBD stone is possible (series 4/ image 34), although equivocal. Pancreas: No focal pancreatic mass, ductal dilatation, or atrophy is visualized. Mild peripancreatic stranding/inflammatory changes along the pancreatic head/ uncinate process and pancreatic tail, suggesting acute pancreatitis. No peripancreatic fluid collection/pseudocyst. Postcontrast imaging is severely motion degraded. Spleen: Within normal limits. Adrenals/Urinary Tract: Adrenal glands are within normal limits. Kidneys are within normal limits.  No  hydronephrosis. Stomach/Bowel: Stomach is within normal limits. Visualized bowel is unremarkable. Vascular/Lymphatic: No evidence of abdominal aortic aneurysm. No suspicious abdominopelvic lymphadenopathy. Small upper abdominal lymph nodes in the porta hepatis, likely reactive. Other: Mild stranding along the porta hepatis. Musculoskeletal: No focal osseous lesions. IMPRESSION: Severely motion degraded images. Suspected acute pancreatitis. No peripancreatic fluid collection/pseudocyst. While postcontrast imaging is severely motion degraded, there are no secondary findings suggestive of a pancreatic mass. Status post cholecystectomy. Associated intrahepatic and extrahepatic ductal dilatation. Common duct measures 12 mm. Possible 4 mm distal CBD stone, although equivocal/poorly evaluated due to motion degradation. Consider ERCP as clinically warranted. Electronically Signed   By: Julian Hy M.D.   On: 10/24/2015 12:25       Assessment / Plan:    Assessment: 1. Abdominal Pain: Increased after 2 days of diarrhea at time of admission, now with epigastric pain thought s/p ERCP/pancreatitis-somewhat better today, 2-3/10, would like to try clear liquid diet again 2. Diarrhea:Prior to admission, C.Diff negative-soft stool this am, not liquid per nursing 3. Abnormal CT abdomen:showing likely pancreatic head mass and biliary ductal dilation, ERCP 10/22/15 with bile duct brushing, results pending-MRI does not show pancreatic mass- CA 19-9 elevated at 119, CEA minimally elevated at 4.8-plans for EUS outpatient for further eval 4. Elevated LFT's:decreasing since time of ERCP and stent placement on 10/22/15 5. Nausea 6. Hypokalemia: resolved 7. Pancreatitis: post ERCP  Plan: 1. Will retry clear liquid this morning 2. Awaiting cytology results 3. CT questions mass, MRI says no mass-plans for pt to follow up outpt for EUS for further eval 4. Continue supportive measures 5. Please await any further recs  from Dr. Henrene Pastor  Thank you for your kind consultation, we will continue to follow    LOS: 4 days   Lavone Nian [facility]  10/26/2015, 8:55 AM  Pager # [phone]  GI ATTENDING  Interval history data reviewed. Patient  personally seen and examined. Agree with interval progress note as outlined above. Overall she is stable. She states that her epigastric pain is the same now as it was prior to hospitalization. Plain films of the abdomen demonstrate biliary stent in adequate position. LFTs continue to improve. At this point, ongoing supportive care with advancement of diet as tolerated. Antiemetics as needed. Outpatient follow-up as previously discussed.  Docia Chuck. Geri Seminole., M.D. [facility] Division of Gastroenterology

## 2015-10-26 NOTE — Progress Notes (Signed)
[facility] Progress Note  Patient: Erika Conley [id number]   PCP: Leonard Downing, MD DOB: 01/09/1935   DOA: 10/22/2015   DOS: 10/26/2015   Date of Service: the patient was seen and examined on 10/26/2015  Subjective: Has been passing gas, it have a bowel movement as well. Tolerated clear liquid diet during the day. Pain has been well controlled. Nutrition: Remains nothing by mouth at present  Brief hospital course: Pt. with PMH of GERD, fibromyalgia; admitted on 10/22/2015, with complaint of abdominal pain, was found to have pancreatic mass with cholestatic jaundice. 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 Gen. surgery was also consulted for pancreatic mass and workup is currently undergoing. On 10/23/2015 patient developed pancreatitis from ERCP. Currently further plan is continue supportive care and workup for pancreatic mass..  Assessment and Plan: 1. Pancreatic mass CT scan is showing pancreatic mass. Gastroenterology consulted general surgery consulted. Patient is status post ERCP with pancreatic stent placement for cholestatic jaundice. Patient denies having any weight loss. CEA and CA-19-9 are elevated although that evaluation can also be in pancreatitis. We will monitor her biopsy result as well. MRI abdomen Negative for any pulmonary mass. Patient will require outpatient EUS. As per [facility] No general surgery follow-up indicated.  2. Cholestatic jaundice. Abnormal LFT. Avoid hepatotoxic medication. Continues to show Improvement after pancreatic stent placement.  3. Post ERCP pancreatitis. Patient is developing significant abdominal pain, lipase level is elevated mildly. Appears to be developing post ERCP pancreatitis. Patient was given indomethacin per protocol postprocedure.  Continue IV ringer lactate. Pain management with as needed Dilaudid and OxyIR. Appreciate GI evaluation. Patient was able  to tolerate clear liquid diet and therefore advance to full liquid diet.  4. Hypothyroidism. Continue Synthroid.  5. One episode of loose watery bowel movement. No further bowel movement to suggest any concern for C. difficile. D/C enteric-precaution.  Pain management: IV Dilaudid, OxyIR When necessary Activity: Consulted physical therapy Home health recommended Bowel regimen: last BM 10/26/2015 Diet: Patient was able to tolerate clear liquid diet in the morning and therefore advance to full liquid diet in the afternoon. DVT Prophylaxis: subcutaneous Heparin  Advance goals of care discussion: DNR/DNI  Family Communication: no family was present at bedside, at the time of interview.  Disposition:  Discharge to home. Expected discharge date: 10/28/2015, improvement in abdominal symptoms  Consultants: Gastroenterology, general surgery Procedures: ERCP and stent placement  Antibiotics: Anti-infectives    Start     Dose/Rate Route Frequency Ordered Stop   10/22/15 1545  Ampicillin-Sulbactam (UNASYN) 3 g in sodium chloride 0.9 % 100 mL IVPB     3 g 100 mL/hr over 60 Minutes Intravenous  Once 10/22/15 1538 10/22/15 1655        Intake/Output Summary (Last 24 hours) at 10/26/15 1844 Last data filed at 10/26/15 0600  Gross per 24 hour  Intake           2212.5 ml  Output                0 ml  Net           2212.5 ml   Filed Weights   10/22/15 0334 10/22/15 1025 10/22/15 1520  Weight: 68 kg (150 lb) 67.8 kg (149 lb 7.6 oz) 67.8 kg (149 lb 7.6 oz)    Objective: Physical Exam: Vitals:   10/25/15 1304 10/25/15 1600 10/26/15 0505 10/26/15 1340  BP: (!) 142/57  (!) 157/63 120/60  Pulse: 72  76 77  Resp: 14  14 16   Temp: 98.6 F (37 C)  98.2 F (36.8 C) 99.2 F (37.3 C)  TempSrc: Oral  Oral Oral  SpO2: 97% 95% 90% 91%  Weight:      Height:        General: Alert, Awake and Oriented to Time, Place and Person. Appear in moderate distress Eyes: PERRL, Conjunctiva  normal ENT: Oral Mucosa clear moist. Neck: no JVD, no Abnormal Mass Or lumps Cardiovascular: S1 and S2 Present, aortic systolic Murmur, Respiratory: Bilateral Air entry equal and Decreased, Clear to Auscultation, no Crackles, no wheezes Abdomen: Bowel Sound present, Soft and tenderness present Skin: no redness, no Rash  Extremities: no Pedal edema, no calf tenderness  Data Reviewed: CBC:  Recent Labs Lab 10/22/15 0543 10/23/15 0418 10/24/15 0429 10/25/15 0529 10/26/15 0420  WBC 10.2 17.3* 12.1* 12.5* 12.9*  NEUTROABS 8.4*  --  10.8* 10.6* 10.7*  HGB 15.0 13.1 13.2 12.1 11.7*  HCT 41.9 36.9 37.4 34.5* 33.3*  MCV 83.3 84.1 82.7 83.5 83.0  PLT PLATELET CLUMPS NOTED ON SMEAR, COUNT APPEARS ADEQUATE 148* 133* 134* 0000000   Basic Metabolic Panel:  Recent Labs Lab 10/23/15 0418 10/23/15 1830 10/24/15 0429 10/25/15 0529 10/26/15 0420  NA 134* 133* 134* 135 135  K 4.5 3.6 4.0 3.8 3.5  CL 101 101 101 101 99*  CO2 28 26 26 27 28   GLUCOSE 145* 120* 114* 89 104*  BUN 17 11 13 14 10   CREATININE 0.93 0.64 0.60 0.56 0.71  CALCIUM 8.3* 8.1* 8.3* 8.2* 8.1*  MG  --   --   --  1.4* 1.8    Liver Function Tests:  Recent Labs Lab 10/22/15 0543 10/23/15 0418 10/24/15 0429 10/25/15 0529 10/26/15 0420  AST 471* 137* 56* 30 23  ALT 375* 196* 128* 81* 57*  ALKPHOS 263* 202* 187* 163* 154*  BILITOT 8.0* 5.4* 2.8* 2.8* 2.1*  PROT 6.9 5.5* 5.7* 5.5* 5.7*  ALBUMIN 4.2 3.2* 3.2* 2.8* 2.7*    Recent Labs Lab 10/22/15 0543 10/23/15 0418 10/25/15 0529  LIPASE 135* 566* 148*   No results for input(s): AMMONIA in the last 168 hours. Coagulation Profile: No results for input(s): INR, PROTIME in the last 168 hours. Cardiac Enzymes: No results for input(s): CKTOTAL, CKMB, CKMBINDEX, TROPONINI in the last 168 hours. BNP (last 3 results) No results for input(s): PROBNP in the last 8760 hours.  CBG: No results for input(s): GLUCAP in the last 168 hours.  Studies: Dg Abd 1  View  Result Date: 10/26/2015 CLINICAL DATA:  Biliary stent location. Not seen on recent MRI. A biliary stent was placed on 10/22/2015. EXAM: ABDOMEN - 1 VIEW COMPARISON:  Abdominal MRI 10/24/2015 and ERCP 10/22/2015 FINDINGS: The bowel gas pattern is normal. A metallic stent measuring approximately 5 cm in length projects over the right upper quadrant just to the right of midline and appears stable in position and orientation oriented as it did on images from an ERCP dated 10/22/2015. Surgical clips are present in the right upper quadrant. Pneumobilia is noted. There is a sliver of a amorphous high density in the left upper quadrant which could potentially be related to contrast in the stomach from one of the patient's recent studies. Vertebroplasty changes are noted at T11 and T12. IMPRESSION: 1. Metallic biliary stent projects over the right upper quadrant and appears similar in position compared to images from the ERCP dated 10/22/2015. 2. Pneumobilia, this can be seen in  the setting of biliary stent. 3. Small amount of high density in the left upper quadrant may be contrast material within the stomach. Electronically Signed   By: Curlene Dolphin M.D.   On: 10/26/2015 12:24     Scheduled Meds: . antiseptic oral rinse  15 mL Mouth Rinse TID  . calcium-vitamin D  1 tablet Oral Daily  . enoxaparin (LOVENOX) injection  40 mg Subcutaneous Q24H  . fluticasone  2 spray Each Nare Daily  . levothyroxine  112 mcg Oral QAC breakfast  . methocarbamol  1,000 mg Oral TID  . multivitamin  1 tablet Oral BID  . pantoprazole  40 mg Oral BID  . traZODone  200 mg Oral QHS   Continuous Infusions: . lactated ringers 75 mL/hr at 10/26/15 1431   PRN Meds: acetaminophen **OR** acetaminophen, aluminum hydroxide-magnesium carbonate, bismuth subsalicylate, dextromethorphan, HYDROmorphone (DILAUDID) injection, hydrOXYzine, lip balm, menthol-cetylpyridinium, ondansetron **OR** ondansetron (ZOFRAN) IV, oxyCODONE, polyvinyl  alcohol, tobramycin  Time spent: 30 minutes  Author: Berle Mull, MD [facility] Pager: [phone] 10/26/2015 6:44 PM  If 7PM-7AM, please contact night-coverage at [email or web address], password [facility]

## 2015-10-26 NOTE — Care Management Important Message (Signed)
Important Message  Patient Details  Name: Erika Conley MRN: [id number] Date of Birth: 1934-03-05   Medicare Important Message Given:  Yes    Camillo Flaming 10/26/2015, 10:01 AMImportant Message  Patient Details  Name: Erika Conley MRN: [id number] Date of Birth: Nov 08, 1934   Medicare Important Message Given:  Yes    Camillo Flaming 10/26/2015, 10:01 AM

## 2015-10-27 ENCOUNTER — Telehealth: Payer: Self-pay

## 2015-10-27 DIAGNOSIS — K858 Other acute pancreatitis without necrosis or infection: Secondary | ICD-10-CM

## 2015-10-27 DIAGNOSIS — K831 Obstruction of bile duct: Secondary | ICD-10-CM

## 2015-10-27 DIAGNOSIS — R1013 Epigastric pain: Secondary | ICD-10-CM

## 2015-10-27 LAB — CBC WITH DIFFERENTIAL/PLATELET
Basophils Absolute: 0 10*3/uL (ref 0.0–0.1)
Basophils Relative: 0 %
Eosinophils Absolute: 0.1 10*3/uL (ref 0.0–0.7)
Eosinophils Relative: 1 %
HEMATOCRIT: 31.6 % — AB (ref 36.0–46.0)
Hemoglobin: 10.9 g/dL — ABNORMAL LOW (ref 12.0–15.0)
LYMPHS ABS: 0.7 10*3/uL (ref 0.7–4.0)
LYMPHS PCT: 6 %
MCH: 29 pg (ref 26.0–34.0)
MCHC: 34.5 g/dL (ref 30.0–36.0)
MCV: 84 fL (ref 78.0–100.0)
MONO ABS: 1.2 10*3/uL — AB (ref 0.1–1.0)
MONOS PCT: 11 %
NEUTROS ABS: 8.8 10*3/uL — AB (ref 1.7–7.7)
Neutrophils Relative %: 82 %
Platelets: 181 10*3/uL (ref 150–400)
RBC: 3.76 MIL/uL — ABNORMAL LOW (ref 3.87–5.11)
RDW: 13.2 % (ref 11.5–15.5)
WBC: 10.8 10*3/uL — ABNORMAL HIGH (ref 4.0–10.5)

## 2015-10-27 LAB — COMPREHENSIVE METABOLIC PANEL
ALT: 40 U/L (ref 14–54)
ANION GAP: 6 (ref 5–15)
AST: 20 U/L (ref 15–41)
Albumin: 2.5 g/dL — ABNORMAL LOW (ref 3.5–5.0)
Alkaline Phosphatase: 153 U/L — ABNORMAL HIGH (ref 38–126)
BILIRUBIN TOTAL: 1.6 mg/dL — AB (ref 0.3–1.2)
BUN: 7 mg/dL (ref 6–20)
CO2: 29 mmol/L (ref 22–32)
Calcium: 8 mg/dL — ABNORMAL LOW (ref 8.9–10.3)
Chloride: 100 mmol/L — ABNORMAL LOW (ref 101–111)
Creatinine, Ser: 0.54 mg/dL (ref 0.44–1.00)
Glucose, Bld: 119 mg/dL — ABNORMAL HIGH (ref 65–99)
POTASSIUM: 3.4 mmol/L — AB (ref 3.5–5.1)
Sodium: 135 mmol/L (ref 135–145)
TOTAL PROTEIN: 5.3 g/dL — AB (ref 6.5–8.1)

## 2015-10-27 LAB — MAGNESIUM: MAGNESIUM: 1.8 mg/dL (ref 1.7–2.4)

## 2015-10-27 MED ORDER — POTASSIUM CHLORIDE CRYS ER 20 MEQ PO TBCR
40.0000 meq | EXTENDED_RELEASE_TABLET | Freq: Once | ORAL | Status: AC
  Administered 2015-10-27: 40 meq via ORAL
  Filled 2015-10-27: qty 2

## 2015-10-27 MED ORDER — OXYCODONE HCL 5 MG PO TABS
5.0000 mg | ORAL_TABLET | ORAL | Status: DC | PRN
  Administered 2015-10-27 – 2015-10-29 (×13): 10 mg via ORAL
  Filled 2015-10-27 (×13): qty 2

## 2015-10-27 MED ORDER — ALUM HYDROXIDE-MAG TRISILICATE 80-20 MG PO CHEW
1.0000 | CHEWABLE_TABLET | Freq: Four times a day (QID) | ORAL | Status: DC | PRN
  Administered 2015-10-27 – 2015-10-29 (×2): 1 via ORAL
  Filled 2015-10-27 (×3): qty 1

## 2015-10-27 NOTE — Progress Notes (Signed)
Per CSW note, Pt son has declined SNF placement and wants pt to return to [facility] ([place]). Pt was active with [facility] for HHPT/RN prior to admission. Resumption orders received for HHPT/RN and [facility] rep aware. No other CM needs communicated. Marney Doctor RN,BSN,NCM [phone]

## 2015-10-27 NOTE — Telephone Encounter (Signed)
-----   Message from Milus Banister, MD sent at 10/27/2015 11:56 AM EDT ----- Thanks  Chong Sicilian, She needs EUS, ERCP in 4-5 weeks at [facility] for dilated CBD, jaundice.  Thanks  SHe is currently an inpatient, should be discharged in next few days.   ----- Message ----- From: Loralie Champagne, PA-C Sent: 10/27/2015   9:26 AM To: Milus Banister, MD  Patient for upper EUS and ERCP to remove stent in about 4-5 weeks.  Thanks,   [employer]

## 2015-10-27 NOTE — NC FL2 (Signed)
[place] LEVEL OF CARE SCREENING TOOL     IDENTIFICATION  Patient Name: Erika Conley Birthdate: Jul 07, 1934 Sex: female Admission Date (Current Location): 10/22/2015  [facility] and [place] Number:  Herbalist and Address:  [facility],  [place], [place]      Provider Number: [id number]  Attending Physician Name and Address:  Geradine Girt, DO  Relative Name and Phone Number:       Current Level of Care: Hospital Recommended Level of Care: [place] Prior Approval Number:    Date Approved/Denied:   PASRR Number: [id number]  Discharge Plan: Domiciliary (Rest home)    Current Diagnoses: Patient Active Problem List   Diagnosis Date Noted  . Common bile duct (CBD) stricture   . Pancreatitis 10/23/2015  . Abdominal pain 10/22/2015  . Pancreatic mass 10/22/2015  . Common bile duct (CBD) obstruction   . Abnormal LFTs   . Dehydration with hyponatremia 04/09/2015  . Acute respiratory failure with hypoxia ([place]) 04/09/2015  . Thrombocytopenia ([place]) 04/09/2015  . Hypokalemia 11/10/2014  . Hypertension 11/10/2014  . Hypothyroidism 11/10/2014  . Syncope due to orthostatic hypotension 11/10/2014  . Constipated   . UTI (urinary tract infection) 06/04/2013  . Altered mental status 06/04/2013    Orientation RESPIRATION BLADDER Height & Weight     Self, Time, Situation, Place  Normal Continent Weight: 149 lb 7.6 oz (67.8 kg) Height:  5\' 1"  (154.9 cm)  BEHAVIORAL SYMPTOMS/MOOD NEUROLOGICAL BOWEL NUTRITION STATUS   (none )  (none ) Continent Diet (Ful liquids )  AMBULATORY STATUS COMMUNICATION OF NEEDS Skin   Limited Assist Verbally Normal                       Personal Care Assistance Level of Assistance  Bathing, Feeding, Dressing Bathing Assistance: Limited assistance Feeding assistance: Limited assistance Dressing Assistance: Limited assistance     Functional Limitations Info  Speech,  Hearing, Sight Sight Info: Adequate Hearing Info: Adequate Speech Info: Adequate    SPECIAL CARE FACTORS FREQUENCY  PT (By licensed PT)     PT Frequency: 3              Contractures Contractures Info: Not present    Additional Factors Info  Code Status, Allergies Code Status Info: DNR CODE  Allergies Info: Codeine, Cortisone, Other            Current Medications (10/27/2015):  This is the current hospital active medication list Current Facility-Administered Medications  Medication Dose Route Frequency Provider Last Rate Last Dose  . acetaminophen (TYLENOL) tablet 650 mg  650 mg Oral Q6H PRN Tawni Millers, MD       Or  . acetaminophen (TYLENOL) suppository 650 mg  650 mg Rectal Q6H PRN Tawni Millers, MD   650 mg at 10/22/15 1842  . aluminum hydroxide-magnesium carbonate (GAVISCON) 95-358 MG/15ML suspension 10 mL  10 mL Oral Q6H PRN Tawni Millers, MD      . antiseptic oral rinse (BIOTENE) solution 15 mL  15 mL Mouth Rinse TID Tawni Millers, MD   15 mL at 10/26/15 2200  . bismuth subsalicylate (PEPTO BISMOL) 262 MG/15ML suspension 30 mL  30 mL Oral Q4H PRN Mauricio Gerome Apley, MD      . calcium-vitamin D (OSCAL WITH D) 500-200 MG-UNIT per tablet 1 tablet  1 tablet Oral Daily Mauricio Gerome Apley, MD   1 tablet at 10/26/15 1022  . dextromethorphan (  DELSYM) 30 MG/5ML liquid 60 mg  10 mL Oral Q12H PRN Tawni Millers, MD      . enoxaparin (LOVENOX) injection 40 mg  40 mg Subcutaneous Q24H Tawni Millers, MD   40 mg at 10/26/15 2102  . fluticasone (FLONASE) 50 MCG/ACT nasal spray 2 spray  2 spray Each Nare Daily Mauricio Gerome Apley, MD   2 spray at 10/26/15 1022  . HYDROmorphone (DILAUDID) injection 0.5 mg  0.5 mg Intravenous Q3H PRN Lavina Hamman, MD   0.5 mg at 10/26/15 1437  . hydrOXYzine (ATARAX/VISTARIL) tablet 25 mg  25 mg Oral TID PRN Lavina Hamman, MD   25 mg at 10/23/15 1410  . lactated ringers infusion    Intravenous Continuous Lavina Hamman, MD 75 mL/hr at 10/27/15 0258    . levothyroxine (SYNTHROID, LEVOTHROID) tablet 112 mcg  112 mcg Oral QAC breakfast Tawni Millers, MD   112 mcg at 10/27/15 0755  . lip balm (CARMEX) ointment   Topical PRN Tawni Millers, MD      . menthol-cetylpyridinium (CEPACOL) lozenge 3 mg  1 lozenge Oral PRN Levin Erp, PA   3 mg at 10/23/15 2158  . methocarbamol (ROBAXIN) tablet 1,000 mg  1,000 mg Oral TID Tawni Millers, MD   1,000 mg at 10/26/15 2101  . multivitamin (PROSIGHT) tablet 1 tablet  1 tablet Oral BID Tawni Millers, MD   1 tablet at 10/26/15 2101  . ondansetron (ZOFRAN) tablet 4 mg  4 mg Oral Q6H PRN Tawni Millers, MD       Or  . ondansetron [facility]) injection 4 mg  4 mg Intravenous Q6H PRN Tawni Millers, MD   4 mg at 10/27/15 0323  . oxyCODONE (Oxy IR/ROXICODONE) immediate release tablet 2.5-5 mg  2.5-5 mg Oral Q3H PRN Lavina Hamman, MD   5 mg at 10/27/15 0755  . pantoprazole (PROTONIX) EC tablet 40 mg  40 mg Oral BID Tawni Millers, MD   40 mg at 10/26/15 2101  . polyvinyl alcohol (LIQUIFILM TEARS) 1.4 % ophthalmic solution 1 drop  1 drop Both Eyes Q4H PRN Mauricio Gerome Apley, MD      . tobramycin (TOBREX) 0.3 % ophthalmic solution 1 drop  1 drop Right Eye QID PRN Tawni Millers, MD      . traZODone (DESYREL) tablet 200 mg  200 mg Oral QHS Tawni Millers, MD   200 mg at 10/26/15 2101     Discharge Medications: Please see discharge summary for a list of discharge medications.  Relevant Imaging Results:  Relevant Lab Results:   Additional Information SSN [id number]  [name]

## 2015-10-27 NOTE — Progress Notes (Signed)
PROGRESS NOTE    Erika Conley  [id number] DOB: September 19, 1934 DOA: 10/22/2015 PCP: Leonard Downing, MD   Outpatient Specialists:     Brief Narrative:  Pt. with PMH of GERD, fibromyalgia; admitted on 10/22/2015, with complaint of abdominal pain, was found to have pancreatic mass with cholestatic jaundice. 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 On 10/23/2015 patient developed pancreatitis from ERCP. MRI was negative for mass Path pending   Assessment & Plan:   Principal Problem:   Pancreatic mass Active Problems:   Abdominal pain   Common bile duct (CBD) obstruction   Abnormal LFTs   Pancreatitis   Common bile duct (CBD) stricture   ? Pancreatic mass CT scan is showing pancreatic mass but not seen on MRI Patient is status post ERCP with pancreatic stent placement for cholestatic jaundice. CEA and CA-19-9 are elevated although that can also be seen in pancreatitis. Await cytology  outpatient EUS.  Cholestatic jaundice. Abnormal LFT. Avoid hepatotoxic medication. Continues to show Improvement after pancreatic stent placement.   Post ERCP pancreatitis. Patient is developing significant abdominal pain, lipase level is elevated mildly. Appears to be developing post ERCP pancreatitis. -advance diet as tolerated  Hypothyroidism. Continue Synthroid.  Refused SNF   DVT prophylaxis:  Lovenox   Code Status: DNR   Family Communication: patient  Disposition Plan:     Consultants:   GI  General surgery  Procedures:  ERCP   Subjective: No issues  Objective: Vitals:   10/26/15 2055 10/27/15 0254 10/27/15 0255 10/27/15 0541  BP: (!) 144/66   (!) 132/58  Pulse: 73  67 62  Resp: 16   20  Temp: 100.1 F (37.8 C)   98.9 F (37.2 C)  TempSrc: Oral   Oral  SpO2: 90% 90%  96%  Weight:      Height:        Intake/Output Summary (Last 24 hours) at 10/27/15 1309 Last data  filed at 10/27/15 0648  Gross per 24 hour  Intake           2027.5 ml  Output                0 ml  Net           2027.5 ml   Filed Weights   10/22/15 0334 10/22/15 1025 10/22/15 1520  Weight: 68 kg (150 lb) 67.8 kg (149 lb 7.6 oz) 67.8 kg (149 lb 7.6 oz)    Examination:  General exam: Appears calm and comfortable  Respiratory system: Clear to auscultation. Respiratory effort normal. Cardiovascular system: S1 & S2 heard, RRR. No JVD, murmurs, rubs, gallops or clicks. No pedal edema. Gastrointestinal system: Abdomen is nondistended, soft and nontender. No organomegaly or masses felt. Normal bowel sounds heard. Central nervous system: Alert     Data Reviewed: I have personally reviewed following labs and imaging studies  CBC:  Recent Labs Lab 10/22/15 0543 10/23/15 0418 10/24/15 0429 10/25/15 0529 10/26/15 0420 10/27/15 0432  WBC 10.2 17.3* 12.1* 12.5* 12.9* 10.8*  NEUTROABS 8.4*  --  10.8* 10.6* 10.7* 8.8*  HGB 15.0 13.1 13.2 12.1 11.7* 10.9*  HCT 41.9 36.9 37.4 34.5* 33.3* 31.6*  MCV 83.3 84.1 82.7 83.5 83.0 84.0  PLT PLATELET CLUMPS NOTED ON SMEAR, COUNT APPEARS ADEQUATE 148* 133* 134* 152 0000000   Basic Metabolic Panel:  Recent Labs Lab 10/23/15 1830 10/24/15 0429 10/25/15 0529 10/26/15 0420 10/27/15 0432  NA 133* 134* 135 135 135  K 3.6 4.0 3.8 3.5 3.4*  CL 101 101 101 99* 100*  CO2 26 26 27 28 29   GLUCOSE 120* 114* 89 104* 119*  BUN 11 13 14 10 7   CREATININE 0.64 0.60 0.56 0.71 0.54  CALCIUM 8.1* 8.3* 8.2* 8.1* 8.0*  MG  --   --  1.4* 1.8 1.8   GFR: Estimated Creatinine Clearance: 51.1 mL/min (by C-G formula based on SCr of 0.8 mg/dL). Liver Function Tests:  Recent Labs Lab 10/23/15 0418 10/24/15 0429 10/25/15 0529 10/26/15 0420 10/27/15 0432  AST 137* 56* 30 23 20   ALT 196* 128* 81* 57* 40  ALKPHOS 202* 187* 163* 154* 153*  BILITOT 5.4* 2.8* 2.8* 2.1* 1.6*  PROT 5.5* 5.7* 5.5* 5.7* 5.3*  ALBUMIN 3.2* 3.2* 2.8* 2.7* 2.5*    Recent  Labs Lab 10/22/15 0543 10/23/15 0418 10/25/15 0529  LIPASE 135* 566* 148*   No results for input(s): AMMONIA in the last 168 hours. Coagulation Profile: No results for input(s): INR, PROTIME in the last 168 hours. Cardiac Enzymes: No results for input(s): CKTOTAL, CKMB, CKMBINDEX, TROPONINI in the last 168 hours. BNP (last 3 results) No results for input(s): PROBNP in the last 8760 hours. HbA1C: No results for input(s): HGBA1C in the last 72 hours. CBG: No results for input(s): GLUCAP in the last 168 hours. Lipid Profile: No results for input(s): CHOL, HDL, LDLCALC, TRIG, CHOLHDL, LDLDIRECT in the last 72 hours. Thyroid Function Tests: No results for input(s): TSH, T4TOTAL, FREET4, T3FREE, THYROIDAB in the last 72 hours. Anemia Panel: No results for input(s): VITAMINB12, FOLATE, FERRITIN, TIBC, IRON, RETICCTPCT in the last 72 hours. Urine analysis:    Component Value Date/Time   COLORURINE ORANGE (A) 10/22/2015 0611   APPEARANCEUR CLEAR 10/22/2015 0611   LABSPEC 1.009 10/22/2015 0611   PHURINE 7.0 10/22/2015 0611   GLUCOSEU NEGATIVE 10/22/2015 0611   HGBUR NEGATIVE 10/22/2015 0611   BILIRUBINUR LARGE (A) 10/22/2015 0611   KETONESUR 40 (A) 10/22/2015 0611   PROTEINUR NEGATIVE 10/22/2015 0611   UROBILINOGEN 0.2 11/21/2014 0501   NITRITE POSITIVE (A) 10/22/2015 0611   LEUKOCYTESUR SMALL (A) 10/22/2015 [id number]     ) Recent Results (from the past 240 hour(s))  MRSA PCR Screening     Status: None   Collection Time: 10/22/15  6:45 PM  Result Value Ref Range Status   MRSA by PCR NEGATIVE NEGATIVE Final    Comment:        The GeneXpert MRSA Assay (FDA approved for NASAL specimens only), is one component of a comprehensive MRSA colonization surveillance program. It is not intended to diagnose MRSA infection nor to guide or monitor treatment for MRSA infections.       Anti-infectives    Start     Dose/Rate Route Frequency Ordered Stop   10/22/15 1545   Ampicillin-Sulbactam (UNASYN) 3 g in sodium chloride 0.9 % 100 mL IVPB     3 g 100 mL/hr over 60 Minutes Intravenous  Once 10/22/15 1538 10/22/15 1655       Radiology Studies: Dg Abd 1 View  Result Date: 10/26/2015 CLINICAL DATA:  Biliary stent location. Not seen on recent MRI. A biliary stent was placed on 10/22/2015. EXAM: ABDOMEN - 1 VIEW COMPARISON:  Abdominal MRI 10/24/2015 and ERCP 10/22/2015 FINDINGS: The bowel gas pattern is normal. A metallic stent measuring approximately 5 cm in length projects over the right upper quadrant just to the right of midline and appears stable in position and orientation oriented as it did on  images from an ERCP dated 10/22/2015. Surgical clips are present in the right upper quadrant. Pneumobilia is noted. There is a sliver of a amorphous high density in the left upper quadrant which could potentially be related to contrast in the stomach from one of the patient's recent studies. Vertebroplasty changes are noted at T11 and T12. IMPRESSION: 1. Metallic biliary stent projects over the right upper quadrant and appears similar in position compared to images from the ERCP dated 10/22/2015. 2. Pneumobilia, this can be seen in the setting of biliary stent. 3. Small amount of high density in the left upper quadrant may be contrast material within the stomach. Electronically Signed   By: Curlene Dolphin M.D.   On: 10/26/2015 12:24        Scheduled Meds: . antiseptic oral rinse  15 mL Mouth Rinse TID  . calcium-vitamin D  1 tablet Oral Daily  . enoxaparin (LOVENOX) injection  40 mg Subcutaneous Q24H  . fluticasone  2 spray Each Nare Daily  . levothyroxine  112 mcg Oral QAC breakfast  . methocarbamol  1,000 mg Oral TID  . multivitamin  1 tablet Oral BID  . pantoprazole  40 mg Oral BID  . potassium chloride  40 mEq Oral Once  . traZODone  200 mg Oral QHS   Continuous Infusions: . lactated ringers 75 mL/hr at 10/27/15 0258     LOS: 5 days    Time spent: 15  min    [place], DO [facility] Pager [phone]  If 7PM-7AM, please contact night-coverage [email or web address] Password [email or web address] 10/27/2015, 1:09 PM

## 2015-10-27 NOTE — Clinical Social Work Note (Signed)
MSW contacted patient's son, [name] in regards to new PT recommendation for SNF placement. Pt's son reported that facility is able to accommodate pt's needs and has declined SNF placement at this time.   MSW has completed new FL2 reflecting patient's return to ALF and resubmitted [facility] for LTC.   MSW to follow up with facility.  Glendon Axe, MSW [phone] 10/27/2015 10:15 AM

## 2015-10-27 NOTE — Progress Notes (Signed)
[facility] Progress Note  Subjective:  Tolerating full liquids.  Mild abdominal pain this AM.  Main complaint is being tired and weak.  Today's labs pending.  Objective:  Vital signs in last 24 hours: Temp:  [98.9 F (37.2 C)-100.1 F (37.8 C)] 98.9 F (37.2 C) (09/06 0541) Pulse Rate:  [62-77] 62 (09/06 0541) Resp:  [16-20] 20 (09/06 0541) BP: (120-144)/(58-66) 132/58 (09/06 0541) SpO2:  [90 %-96 %] 96 % (09/06 0541) Last BM Date: 10/26/15 General:  Alert, Well-developed, in NAD Heart:  Regular rate and rhythm; SEM noted. Pulm:  CTAB.  No W/R/R. Abdomen:  Soft, non-distended.  BS present.  Mild epigastric TTP. Extremities:  Without edema. Neurologic:  Alert and oriented x 4;  grossly normal neurologically. Psych:  Alert and cooperative. Normal mood and affect.  Intake/Output from previous day: 09/05 0701 - 09/06 0700 In: 2027.5 [I.V.:2027.5] Out: -   Lab Results:  Recent Labs  10/25/15 0529 10/26/15 0420  WBC 12.5* 12.9*  HGB 12.1 11.7*  HCT 34.5* 33.3*  PLT 134* 152   BMET  Recent Labs  10/25/15 0529 10/26/15 0420  NA 135 135  K 3.8 3.5  CL 101 99*  CO2 27 28  GLUCOSE 89 104*  BUN 14 10  CREATININE 0.56 0.71  CALCIUM 8.2* 8.1*   LFT  Recent Labs  10/26/15 0420  PROT 5.7*  ALBUMIN 2.7*  AST 23  ALT 57*  ALKPHOS 154*  BILITOT 2.1*   Dg Abd 1 View  Result Date: 10/26/2015 CLINICAL DATA:  Biliary stent location. Not seen on recent MRI. A biliary stent was placed on 10/22/2015. EXAM: ABDOMEN - 1 VIEW COMPARISON:  Abdominal MRI 10/24/2015 and ERCP 10/22/2015 FINDINGS: The bowel gas pattern is normal. A metallic stent measuring approximately 5 cm in length projects over the right upper quadrant just to the right of midline and appears stable in position and orientation oriented as it did on images from an ERCP dated 10/22/2015. Surgical clips are present in the right upper quadrant. Pneumobilia is noted. There is a sliver of a  amorphous high density in the left upper quadrant which could potentially be related to contrast in the stomach from one of the patient's recent studies. Vertebroplasty changes are noted at T11 and T12. IMPRESSION: 1. Metallic biliary stent projects over the right upper quadrant and appears similar in position compared to images from the ERCP dated 10/22/2015. 2. Pneumobilia, this can be seen in the setting of biliary stent. 3. Small amount of high density in the left upper quadrant may be contrast material within the stomach. Electronically Signed   By: Curlene Dolphin M.D.   On: 10/26/2015 12:24   Assessment / Plan: *81 year old female with epigastric abdominal pain and CT scan showing likely pancreatic head mass and biliary ductal dilation, ERCP 10/22/15 with bile duct brushing and stent placement, cytology results pending.  MRI does not show pancreatic mass but CA 19-9 elevated at 119, CEA minimally elevated at 4.8.  LFT's have been trending down since procedure. *Post-ERCP pancreatitis:  Lipase trending down and patient is improving clinically.  -Continue full liquids with further advancement as tolerated. -Dr. Ardis Hughs is arranging for outpatient EUS/ERCP with stent removal in about 4-5 weeks and we will contact her with that information. -Will follow-up cytology as well. -Continue supportive measures. -Today's labs pending.   LOS: 5 days   ZEHR, JESSICA D.  10/27/2015, 8:55 AM  Pager number [phone]  GI ATTENDING  Interval  history data reviewed. Agree with interval progress note. Patient stable. Tolerating diet. Biliary stent functioning well. No further recommendations from GI standpoint. Should be sent home with antiemetics and oral analgesics if needed. GI follow-up with Dr. Ardis Hughs as planned.  Docia Chuck. Geri [place]., M.D. [facility] of Gastroenterology

## 2015-10-27 NOTE — Progress Notes (Signed)
Physical Therapy Treatment Patient Details Name: Erika Conley MRN: [id number] DOB: 1934/10/31 Today's Date: 10/27/2015    History of Present Illness Pt admitted from ALF  with c/o abdominal pain and dx with pancreatic mass.  Pt wtih hx o fo vertigo, fibromyalgia, back sugery and frequent falls    PT Comments    Pt required MAX encouragement to get OOB to amb.  Required increased, increased time.  Slow but staedy.  Pt usually amb with her 4WW at [facility].  Stated she has to walk to dinning room for meals.  Assisted with amb a limited distance due to need to "potty".  Assisted to bathroom then back to bed.   Per chart review, son declines SNF and wishes pt to return to her ALF.  Follow Up Recommendations  SNF     Equipment Recommendations  None recommended by PT    Recommendations for Other Services       Precautions / Restrictions Precautions Precautions: Fall Restrictions Weight Bearing Restrictions: No    Mobility  Bed Mobility Overal bed mobility: Needs Assistance Bed Mobility: Sit to Supine;Supine to Sit     Supine to sit: Min assist Sit to supine: Min assist   General bed mobility comments: assist for LEs  Transfers Overall transfer level: Needs assistance Equipment used: Rolling walker (2 wheeled) Transfers: Sit to/from [facility] Sit to Stand: Min assist Stand pivot transfers: Min assist       General transfer comment: increased, increased time and 25% VC's safety with turn completion and hand placement esp with stand to sit  Ambulation/Gait Ambulation/Gait assistance: Min assist;Min guard Ambulation Distance (Feet): 28 Feet (amb distance self limited) Assistive device: Rolling walker (2 wheeled) Gait Pattern/deviations: Step-to pattern;Step-through pattern     General Gait Details: cues for posture and position from RW.  Multiple standing rests required to complete task.  Required increased, increased, increased  time   Stairs            Wheelchair Mobility    Modified Rankin (Stroke Patients Only)       Balance                                    Cognition Arousal/Alertness: Awake/alert   Overall Cognitive Status: History of cognitive impairments - at baseline                      Exercises      General Comments        Pertinent Vitals/Pain Pain Assessment: Faces Faces Pain Scale: Hurts a little bit Pain Location: back and ABD Pain Descriptors / Indicators: Aching;Discomfort Pain Intervention(s): Monitored during session;Repositioned    Home Living                      Prior Function            PT Goals (current goals can now be found in the care plan section) Progress towards PT goals: Progressing toward goals    Frequency  Min 3X/week    PT Plan Discharge plan needs to be updated    Co-evaluation             End of Session Equipment Utilized During Treatment: Gait belt Activity Tolerance: Patient limited by fatigue;Other (comment) (self limiting) Patient left: in bed;with call bell/phone within reach;with bed alarm set     Time: [phone]  PT Time Calculation (min) (ACUTE ONLY): 27 min  Charges:  $Gait Training: 8-22 mins $Therapeutic Activity: 8-22 mins                    G Codes:      Rica Koyanagi  PTA [facility]  Acute  Rehab Pager      [phone]

## 2015-10-28 ENCOUNTER — Inpatient Hospital Stay (HOSPITAL_COMMUNITY): Payer: Medicare Other

## 2015-10-28 DIAGNOSIS — M797 Fibromyalgia: Secondary | ICD-10-CM

## 2015-10-28 LAB — COMPREHENSIVE METABOLIC PANEL
ALT: 33 U/L (ref 14–54)
ANION GAP: 6 (ref 5–15)
AST: 21 U/L (ref 15–41)
Albumin: 2.5 g/dL — ABNORMAL LOW (ref 3.5–5.0)
Alkaline Phosphatase: 145 U/L — ABNORMAL HIGH (ref 38–126)
BILIRUBIN TOTAL: 1.2 mg/dL (ref 0.3–1.2)
BUN: 6 mg/dL (ref 6–20)
CO2: 29 mmol/L (ref 22–32)
Calcium: 8 mg/dL — ABNORMAL LOW (ref 8.9–10.3)
Chloride: 102 mmol/L (ref 101–111)
Creatinine, Ser: 0.65 mg/dL (ref 0.44–1.00)
Glucose, Bld: 117 mg/dL — ABNORMAL HIGH (ref 65–99)
POTASSIUM: 3.4 mmol/L — AB (ref 3.5–5.1)
Sodium: 137 mmol/L (ref 135–145)
TOTAL PROTEIN: 5.3 g/dL — AB (ref 6.5–8.1)

## 2015-10-28 LAB — CBC
HEMATOCRIT: 31.7 % — AB (ref 36.0–46.0)
Hemoglobin: 11.2 g/dL — ABNORMAL LOW (ref 12.0–15.0)
MCH: 30.4 pg (ref 26.0–34.0)
MCHC: 35.3 g/dL (ref 30.0–36.0)
MCV: 85.9 fL (ref 78.0–100.0)
Platelets: 215 10*3/uL (ref 150–400)
RBC: 3.69 MIL/uL — ABNORMAL LOW (ref 3.87–5.11)
RDW: 13.6 % (ref 11.5–15.5)
WBC: 11 10*3/uL — ABNORMAL HIGH (ref 4.0–10.5)

## 2015-10-28 LAB — URINALYSIS, ROUTINE W REFLEX MICROSCOPIC
BILIRUBIN URINE: NEGATIVE
GLUCOSE, UA: NEGATIVE mg/dL
Hgb urine dipstick: NEGATIVE
KETONES UR: NEGATIVE mg/dL
NITRITE: NEGATIVE
Protein, ur: NEGATIVE mg/dL
Specific Gravity, Urine: 1.01 (ref 1.005–1.030)
pH: 6.5 (ref 5.0–8.0)

## 2015-10-28 LAB — URINE MICROSCOPIC-ADD ON

## 2015-10-28 MED ORDER — MORPHINE SULFATE (PF) 2 MG/ML IV SOLN: 2.0000 mg | INTRAVENOUS | Status: DC | PRN

## 2015-10-28 MED ORDER — GI COCKTAIL ~~LOC~~: 30.0000 mL | Freq: Three times a day (TID) | ORAL | Status: DC | PRN

## 2015-10-28 MED ORDER — TRAMADOL HCL 50 MG PO TABS: 50.0000 mg | ORAL_TABLET | Freq: Four times a day (QID) | ORAL | Status: DC | PRN

## 2015-10-28 NOTE — Progress Notes (Signed)
PROGRESS NOTE    Erika Conley  [id number] DOB: Sep 27, 1934 DOA: 10/22/2015 PCP: Leonard Downing, MD   Outpatient Specialists:     Brief Narrative:  Pt. with PMHx of GERD, fibromyalgia; admitted on 10/22/2015, with complaint of abdominal pain, was found to have pancreatic mass with cholestatic jaundice.  Patient underwent ERCP with pancreatic stent placement, she was found to be having a papillary stricture but no mass seen. On 10/23/2015 patient developed pancreatitis from ERCP.    Assessment & Plan:   Principal Problem:   Pancreatic mass Active Problems:   Abdominal pain   Common bile duct (CBD) obstruction   Abnormal LFTs   Pancreatitis   Common bile duct (CBD) stricture   Obstructive jaundice   ? Pancreatic mass CT scan is showing pancreatic mass but not seen on MRI Patient is status post ERCP with pancreatic stent placement for cholestatic jaundice. CEA and CA-19-9 are elevated although that can also be seen in pancreatitis. Cytology shows no sign of cancer  outpatient EUS 4-5 weeks with Dr. Ardis Hughs  Low grade fever 9/6 -check U/A and portable chest x ray (shows atelectasis) -added incentive spirometry OOB to chair  Cholestatic jaundice. Abnormal LFT. Avoid hepatotoxic medication. Continues to show Improvement after pancreatic stent placement.   Post ERCP pancreatitis. -advance diet as tolerated-- pain varies  Hypothyroidism. Continue Synthroid.  Refused SNF by son but ALF reviewed PT Notes and can accommodate her   DVT prophylaxis:  Lovenox   Code Status: DNR   Family Communication: patient  Disposition Plan:  D/c in AM back to ALF if afebrile   Consultants:   GI  General surgery  Procedures:  ERCP   Subjective: intermittently c/o abd pain after eating   Objective: Vitals:   10/27/15 1818 10/27/15 2149 10/27/15 2153 10/28/15 0543  BP:  106/65  136/65  Pulse:  83 79 64  Resp:  20  20  Temp: 97.8 F (36.6 C) 99.6  F (37.6 C)  98.7 F (37.1 C)  TempSrc:  Oral  Oral  SpO2:  (!) 81% 92% 96%  Weight:      Height:        Intake/Output Summary (Last 24 hours) at 10/28/15 1303 Last data filed at 10/28/15 0920  Gross per 24 hour  Intake              342 ml  Output                0 ml  Net              342 ml   Filed Weights   10/22/15 0334 10/22/15 1025 10/22/15 1520  Weight: 68 kg (150 lb) 67.8 kg (149 lb 7.6 oz) 67.8 kg (149 lb 7.6 oz)    Examination:  General exam: tearful, saying son has not come to visit her  Respiratory system: Clear to auscultation. Respiratory effort normal. Cardiovascular system: S1 & S2 heard, RRR. No JVD, murmurs, rubs, gallops or clicks. No pedal edema. Gastrointestinal system: Abdomen is nondistended, soft and nontender. No organomegaly or masses felt. Normal bowel sounds heard. Central nervous system: Alert     Data Reviewed: I have personally reviewed following labs and imaging studies  CBC:  Recent Labs Lab 10/22/15 0543  10/24/15 0429 10/25/15 0529 10/26/15 0420 10/27/15 0432 10/28/15 0400  WBC 10.2  < > 12.1* 12.5* 12.9* 10.8* 11.0*  NEUTROABS 8.4*  --  10.8* 10.6* 10.7* 8.8*  --   HGB 15.0  < >  13.2 12.1 11.7* 10.9* 11.2*  HCT 41.9  < > 37.4 34.5* 33.3* 31.6* 31.7*  MCV 83.3  < > 82.7 83.5 83.0 84.0 85.9  PLT PLATELET CLUMPS NOTED ON SMEAR, COUNT APPEARS ADEQUATE  < > 133* 134* 152 181 215  < > = values in this interval not displayed. Basic Metabolic Panel:  Recent Labs Lab 10/24/15 0429 10/25/15 0529 10/26/15 0420 10/27/15 0432 10/28/15 0400  NA 134* 135 135 135 137  K 4.0 3.8 3.5 3.4* 3.4*  CL 101 101 99* 100* 102  CO2 26 27 28 29 29   GLUCOSE 114* 89 104* 119* 117*  BUN 13 14 10 7 6   CREATININE 0.60 0.56 0.71 0.54 0.65  CALCIUM 8.3* 8.2* 8.1* 8.0* 8.0*  MG  --  1.4* 1.8 1.8  --    GFR: Estimated Creatinine Clearance: 51.1 mL/min (by C-G formula based on SCr of 0.8 mg/dL). Liver Function Tests:  Recent Labs Lab  10/24/15 0429 10/25/15 0529 10/26/15 0420 10/27/15 0432 10/28/15 0400  AST 56* 30 23 20 21   ALT 128* 81* 57* 40 33  ALKPHOS 187* 163* 154* 153* 145*  BILITOT 2.8* 2.8* 2.1* 1.6* 1.2  PROT 5.7* 5.5* 5.7* 5.3* 5.3*  ALBUMIN 3.2* 2.8* 2.7* 2.5* 2.5*    Recent Labs Lab 10/22/15 0543 10/23/15 0418 10/25/15 0529  LIPASE 135* 566* 148*   No results for input(s): AMMONIA in the last 168 hours. Coagulation Profile: No results for input(s): INR, PROTIME in the last 168 hours. Cardiac Enzymes: No results for input(s): CKTOTAL, CKMB, CKMBINDEX, TROPONINI in the last 168 hours. BNP (last 3 results) No results for input(s): PROBNP in the last 8760 hours. HbA1C: No results for input(s): HGBA1C in the last 72 hours. CBG: No results for input(s): GLUCAP in the last 168 hours. Lipid Profile: No results for input(s): CHOL, HDL, LDLCALC, TRIG, CHOLHDL, LDLDIRECT in the last 72 hours. Thyroid Function Tests: No results for input(s): TSH, T4TOTAL, FREET4, T3FREE, THYROIDAB in the last 72 hours. Anemia Panel: No results for input(s): VITAMINB12, FOLATE, FERRITIN, TIBC, IRON, RETICCTPCT in the last 72 hours. Urine analysis:    Component Value Date/Time   COLORURINE ORANGE (A) 10/22/2015 0611   APPEARANCEUR CLEAR 10/22/2015 0611   LABSPEC 1.009 10/22/2015 0611   PHURINE 7.0 10/22/2015 0611   GLUCOSEU NEGATIVE 10/22/2015 0611   HGBUR NEGATIVE 10/22/2015 0611   BILIRUBINUR LARGE (A) 10/22/2015 0611   KETONESUR 40 (A) 10/22/2015 0611   PROTEINUR NEGATIVE 10/22/2015 0611   UROBILINOGEN 0.2 11/21/2014 0501   NITRITE POSITIVE (A) 10/22/2015 0611   LEUKOCYTESUR SMALL (A) 10/22/2015 [id number]     ) Recent Results (from the past 240 hour(s))  MRSA PCR Screening     Status: None   Collection Time: 10/22/15  6:45 PM  Result Value Ref Range Status   MRSA by PCR NEGATIVE NEGATIVE Final    Comment:        The GeneXpert MRSA Assay (FDA approved for NASAL specimens only), is one component of  a comprehensive MRSA colonization surveillance program. It is not intended to diagnose MRSA infection nor to guide or monitor treatment for MRSA infections.       Anti-infectives    Start     Dose/Rate Route Frequency Ordered Stop   10/22/15 1545  Ampicillin-Sulbactam (UNASYN) 3 g in sodium chloride 0.9 % 100 mL IVPB     3 g 100 mL/hr over 60 Minutes Intravenous  Once 10/22/15 1538 10/22/15 1655       Radiology  Studies: Dg Chest Port 1 View  Result Date: 10/28/2015 CLINICAL DATA:  Cough, history hypertension EXAM: PORTABLE CHEST 1 VIEW COMPARISON:  Portable exam 1231 hours compared to 04/29/2015 FINDINGS: Enlargement of cardiac silhouette. Mediastinal contours and pulmonary vascularity normal. Bronchitic changes with bibasilar atelectasis greater on RIGHT. Upper lungs clear. No definite pleural effusion or pneumothorax. Bones demineralized. IMPRESSION: Enlargement of cardiac silhouette. Bronchitic changes with bibasilar atelectasis. Electronically Signed   By: Lavonia Dana M.D.   On: 10/28/2015 12:47        Scheduled Meds: . antiseptic oral rinse  15 mL Mouth Rinse TID  . calcium-vitamin D  1 tablet Oral Daily  . enoxaparin (LOVENOX) injection  40 mg Subcutaneous Q24H  . fluticasone  2 spray Each Nare Daily  . levothyroxine  112 mcg Oral QAC breakfast  . methocarbamol  1,000 mg Oral TID  . multivitamin  1 tablet Oral BID  . pantoprazole  40 mg Oral BID  . traZODone  200 mg Oral QHS   Continuous Infusions:     LOS: 6 days    Time spent: 15 min    [place], DO [facility] Pager [phone]  If 7PM-7AM, please contact night-coverage [email or web address] Password [email or web address] 10/28/2015, 1:03 PM

## 2015-10-28 NOTE — Progress Notes (Signed)
CSW assisting with d/c planning. CSW received call from April at ALF this afternoon. April reports that ALF is able to manage pt's care and would like to admit pt at d/c. MD is aware and planning possible d/c on 10/29/15. CSW will continue to follow to assist with d/c planning needs.  Werner Lean LCSW [phone]

## 2015-10-29 DIAGNOSIS — K838 Other specified diseases of biliary tract: Secondary | ICD-10-CM

## 2015-10-29 LAB — CREATININE, SERUM
Creatinine, Ser: 0.68 mg/dL (ref 0.44–1.00)
GFR calc Af Amer: >60 mL/min (ref >60)
GFR calc non Af Amer: >60 mL/min (ref >60)

## 2015-10-29 MED ORDER — OXYCODONE HCL 5 MG PO TABS: 5.0000 mg | ORAL_TABLET | Freq: Four times a day (QID) | ORAL | 0 refills | Status: AC | PRN

## 2015-10-29 NOTE — Telephone Encounter (Signed)
The pt is still an inpatient, will check again on Monday.

## 2015-10-29 NOTE — Clinical Social Work Note (Addendum)
Currently awaiting MD signature on DNR (DNR placed on shadow chart for MD signature).   UPDATE: MD received page and will sign DNR within the next 10-15 minutes.   Patient will eat lunch. [facility]AR arranged for 1:30PM.   Medical Social Worker facilitated patient discharge including contacting patient family (left detailed message with son, [name]) and facility to confirm patient discharge plans.  Clinical information faxed to facility and family agreeable with plan.  MSW arranged ambulance transport via [facility] to [facility] [place].  RN to call report prior to discharge.  Medical Social Worker will sign off for now as social work intervention is no longer needed. Please consult [place] again if new need arises.  Glendon Axe, MSW [phone] 10/29/2015 11:43 AM

## 2015-10-29 NOTE — Discharge Summary (Signed)
Physician Discharge Summary  Erika Conley [id number] DOB: 09-Aug-1934 DOA: 10/22/2015  PCP: Leonard Downing, MD  Admit date: 10/22/2015 Discharge date: 10/29/2015  Time spent: >45 minutes  Recommendations for Outpatient Follow-up:  F/u with GI. Dr. Ardis Hughs in 4-5 weeks F/u with PCP in 3-7 days as needed  Discharge Diagnoses:  Principal Problem:   Pancreatic mass Active Problems:   Abdominal pain   Common bile duct (CBD) obstruction   Abnormal LFTs   Pancreatitis   Common bile duct (CBD) stricture   Obstructive jaundice   Discharge Condition: stable   Diet recommendation: low sodium   Filed Weights   10/22/15 0334 10/22/15 1025 10/22/15 1520  Weight: 68 kg (150 lb) 67.8 kg (149 lb 7.6 oz) 67.8 kg (149 lb 7.6 oz)    History of present illness:  Pt. with PMH of GERD, fibromyalgia; admitted on 10/22/2015, with complaint of abdominal pain, was found to have pancreatic mass with cholestatic jaundice. 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 MRI was negative for mass. On 10/23/2015 patient developed pancreatitis from ERCP.  Path pending, GI recommended to f/u with Dr. Ardis Hughs in 4-5 weeks  Hospital Course:  ? Pancreatic mass. CT scan is showing pancreatic mass but not seen on MRI. Patient is status post ERCP with pancreatic stent placement for cholestatic jaundice. CEA and CA-19-9 are elevated although that can also be seen in pancreatitis. Await cytology, d/w patient plans to f/u with Dr. Ardis Hughs in 4-5 weeks. outpatient repeat ERCP/EUS  Cholestatic jaundice. Abnormal LFT on admission. LFTs improved after ERCP. Avoid hepatotoxic medication.  Post ERCP pancreatitis. Patient is developing significant abdominal pain, lipase level is elevated mildly. Appears to be developing post ERCP pancreatitis. -lipase continuous to improve, tolerating diet well   Hypothyroidism. continue Synthroid.  Patient requested few  oxycodone prn at discharge post procedure use  Procedures:  ERCP (i.e. Studies not automatically included, echos, thoracentesis, etc; not x-rays)  Consultations:  GI  Discharge Exam: Vitals:   10/28/15 2117 10/29/15 0603  BP: (!) 127/54 119/66  Pulse: 79 64  Resp: 20 16  Temp: (!) 100.4 F (38 C) 99.7 F (37.6 C)    General: alert, no distress  Cardiovascular: s1,s2 rrr Respiratory: CTA BL  Discharge Instructions  Discharge Instructions    Ambulatory referral to Gastroenterology    Complete by:  As directed   Call MD for:  persistant nausea and vomiting    Complete by:  As directed   Call MD for:  severe uncontrolled pain    Complete by:  As directed   Call MD for:  temperature >100.4    Complete by:  As directed   Diet - low sodium heart healthy    Complete by:  As directed   Increase activity slowly    Complete by:  As directed       Medication List    STOP taking these medications   benzonatate 100 MG capsule Commonly known as:  TESSALON   meclizine 25 MG tablet Commonly known as:  ANTIVERT     TAKE these medications   albuterol 108 (90 Base) MCG/ACT inhaler Commonly known as:  PROVENTIL HFA;VENTOLIN HFA Inhale 2 puffs into the lungs every 4 (four) hours as needed for wheezing or shortness of breath (coough).   aluminum hydroxide-magnesium carbonate 95-358 MG/15ML Susp Commonly known as:  GAVISCON Take 10 mLs by mouth every 6 (six) hours as needed for indigestion.   antiseptic oral rinse  Liqd Swish and spit 15 mLs 3 (three) times daily.   benzocaine-resorcinol 5-2 % vaginal cream Commonly known as:  VAGISIL Place 1 application vaginally as needed for itching.   bismuth subsalicylate 99991111 [id number] suspension Commonly known as:  PEPTO BISMOL Take 30 mLs by mouth every 4 (four) hours as needed for diarrhea or loose stools.   calcium-vitamin D 500-200 MG-UNIT tablet Commonly known as:  OSCAL WITH D Take 1 tablet by mouth daily.   cetirizine 10 MG  tablet Commonly known as:  ZYRTEC Take 10 mg by mouth daily.   chlorhexidine 0.12 % solution Commonly known as:  PERIDEX Swish and spit 15 mLs 2 (two) times daily.   DELSYM 30 MG/5ML liquid Generic drug:  dextromethorphan Take 10 mLs by mouth every 12 (twelve) hours as needed for cough.   Dexlansoprazole 30 MG capsule Take 30 mg by mouth daily.   fluticasone 50 MCG/ACT nasal spray Commonly known as:  FLONASE Place 2 sprays into both nostrils daily.   hydrochlorothiazide 25 MG tablet Commonly known as:  HYDRODIURIL Take 25 mg by mouth daily.   hydrocortisone cream 1 % Apply 1 application topically every 6 (six) hours as needed for itching.   hydroxypropyl methylcellulose / hypromellose 2.5 % ophthalmic solution Commonly known as:  ISOPTO TEARS / GONIOVISC Place 1 drop into both eyes every 4 (four) hours as needed for dry eyes.   hydrOXYzine 25 MG capsule Commonly known as:  VISTARIL Take 25 mg by mouth every 8 (eight) hours as needed for anxiety.   JOHNSONS BABY SHAMPOO EX Apply topically. Dilute a pea size amount with water; close right eye, wash and rinse. Repeat twice daily for 2 days prior to injection.   LASTACAFT 0.25 % Soln Generic drug:  Alcaftadine Place 1 drop into both eyes at bedtime.   levothyroxine 112 MCG tablet Commonly known as:  SYNTHROID, LEVOTHROID Take 112 mcg by mouth daily before breakfast.   loperamide 2 MG capsule Commonly known as:  IMODIUM Take 2 mg by mouth daily as needed for diarrhea or loose stools. After each loose stool   methocarbamol 500 MG tablet Commonly known as:  ROBAXIN Take 1,000 mg by mouth 3 (three) times daily.   ondansetron 8 MG tablet Commonly known as:  ZOFRAN Take 8 mg by mouth every 8 (eight) hours as needed for nausea or vomiting.   oxyCODONE 5 MG immediate release tablet Commonly known as:  Oxy IR/ROXICODONE Take 1 tablet (5 mg total) by mouth every 6 (six) hours as needed for severe pain or breakthrough  pain.   PRESERVISION AREDS Caps Take 1 capsule by mouth 2 (two) times daily.   tobramycin 0.3 % ophthalmic solution Commonly known as:  TOBREX Place 1 drop into the right eye 4 (four) times daily as needed (the day prior, the day of, and the day after treatment.).   traMADol 50 MG tablet Commonly known as:  ULTRAM Take 50 mg by mouth every 6 (six) hours as needed for moderate pain.   traZODone 100 MG tablet Commonly known as:  DESYREL Take 200 mg by mouth at bedtime.   triamcinolone cream 0.1 % Commonly known as:  KENALOG Apply 1 application topically every 6 (six) hours as needed (itching).   trolamine salicylate 10 % cream Commonly known as:  ASPERCREME Apply 1 application topically every 8 (eight) hours as needed for muscle pain.      Allergies  Allergen Reactions  . Codeine     Pass out, fall  .  Cortisone     Pass out, fall   . Other     All narcotics cause patient to Pass out, fall    Follow-up Information    [facility] .   Specialty:  Home Health Services Contact information: [place] [phone]            The results of significant diagnostics from this hospitalization (including imaging, microbiology, ancillary and laboratory) are listed below for reference.    Significant Diagnostic Studies: Dg Abd 1 View  Result Date: 10/26/2015 CLINICAL DATA:  Biliary stent location. Not seen on recent MRI. A biliary stent was placed on 10/22/2015. EXAM: ABDOMEN - 1 VIEW COMPARISON:  Abdominal MRI 10/24/2015 and ERCP 10/22/2015 FINDINGS: The bowel gas pattern is normal. A metallic stent measuring approximately 5 cm in length projects over the right upper quadrant just to the right of midline and appears stable in position and orientation oriented as it did on images from an ERCP dated 10/22/2015. Surgical clips are present in the right upper quadrant. Pneumobilia is noted. There is a sliver of a amorphous high density in  the left upper quadrant which could potentially be related to contrast in the stomach from one of the patient's recent studies. Vertebroplasty changes are noted at T11 and T12. IMPRESSION: 1. Metallic biliary stent projects over the right upper quadrant and appears similar in position compared to images from the ERCP dated 10/22/2015. 2. Pneumobilia, this can be seen in the setting of biliary stent. 3. Small amount of high density in the left upper quadrant may be contrast material within the stomach. Electronically Signed   By: Curlene Dolphin M.D.   On: 10/26/2015 12:24   Ct Abdomen Pelvis W Contrast  Result Date: 10/22/2015 CLINICAL DATA:  Abdominal pain with elevated white blood cell count, elevated lipase, and nausea/vomiting EXAM: CT ABDOMEN AND PELVIS WITH CONTRAST TECHNIQUE: Multidetector CT imaging of the abdomen and pelvis was performed using the standard protocol following bolus administration of intravenous contrast. Oral contrast was also administered. CONTRAST:  170mL ISOVUE-300 IOPAMIDOL (ISOVUE-300) INJECTION 61% COMPARISON:  None. FINDINGS: Lower chest:  There is patchy bibasilar atelectatic change. Hepatobiliary: The liver measures 17.5 cm in length. There is hepatic steatosis. No focal liver mass is seen. However, there is marked intrahepatic biliary duct dilatation. Gallbladder is absent. The common bile duct is dilated to the level of the ampulla. There is mild dilatation of the adjacent pancreatic duct in this area. There is an area of decreased attenuation in the pancreatic head measuring 2.6 x 1.9 x 1.8 cm, best appreciable on axial slice 27 series 2 and coronal slice 46 series 4. There is no peripancreatic mesenteric stranding or thickening. There is a cystic area in the tail of the pancreas measuring 8 x 7 x 8 mm, best seen on axial slice 22 series 2 and coronal slice 57 series 4. No other pancreatic lesions are evident. Pancreas: No splenic lesions are evident. Spleen: Adrenals appear  normal bilaterally. Adrenals/Urinary Tract: Kidneys bilaterally show no mass or hydronephrosis on either side. There is no renal or ureteral calculus on either side. Urinary bladder is midline with wall thickness within normal limits. Stomach/Bowel: There is no appreciable bowel wall or mesenteric thickening. There are scattered colonic diverticula without diverticulitis. There is no bowel obstruction. No free air or portal venous air. There is lipomatous infiltration of the ileocecal valve. Vascular/Lymphatic: There is atherosclerotic calcification in the aorta but no aneurysm appreciable. Major mesenteric  vessels appear patent. There is no evident adenopathy in the abdomen or pelvis. Reproductive: Uterus is absent. There is no pelvic mass or pelvic fluid collection. Other: Appendix is not appreciable. There is no periappendiceal region inflammation. There is no abscess or ascites in the abdomen or pelvis. Musculoskeletal: The patient has had kyphoplasty procedures at T11 and T12. There is also marked collapse of the T10 vertebral body with increase in kyphosis in these regions. There is moderate spinal stenosis at T12 due to a degree of retropulsion of bone in this area. There are no blastic or lytic bone lesions. There is no intramuscular or abdominal wall lesion. IMPRESSION: **An incidental finding of potential clinical significance has been found. Concern for pancreatic neoplasm in the head of the pancreas measuring 2.6 x 1.9 x 1.8 cm. There is marked biliary duct dilatation, likely due to compression from this apparent mass. There is in addition a subcentimeter cystic area in the tail of the pancreas. Given these findings, MR correlation with particular attention to the pancreas pre and post-contrast advised.**Note that there is no surrounding peripancreatic mesenteric stranding or edema as would be expected with pancreatitis. Pancreatitis is possible in this circumstance but felt to be less likely than  neoplasm given the overall appearance. Liver is prominent with hepatic steatosis. No focal liver mass lesions are evident. No adenopathy evident. No bowel obstruction. No abscess. No periappendiceal region inflammation. Spinal stenosis due to retropulsion of bone at that level of T12. Patient has had fractures of T10, T11, and T12. The patient has undergone kyphoplasty procedures at T11 and T12. There is localized kyphosis in the lower thoracic spine in the areas of these fractures. Electronically Signed   By: Lowella Grip III M.D.   On: 10/22/2015 07:40   Mr 3d Recon At Scanner  Result Date: 10/24/2015 CLINICAL DATA:  Abdominal pain, elevated lipase, jaundice, possible pancreatic mass on CT EXAM: MRI ABDOMEN WITHOUT AND WITH CONTRAST (INCLUDING MRCP) TECHNIQUE: Multiplanar multisequence MR imaging of the abdomen was performed both before and after the administration of intravenous contrast. Heavily T2-weighted images of the biliary and pancreatic ducts were obtained, and three-dimensional MRCP images were rendered by post processing. CONTRAST:  75mL MULTIHANCE GADOBENATE DIMEGLUMINE 529 MG/ML IV SOLN COMPARISON:  CT abdomen pelvis dated 10/22/2015 FINDINGS: Motion degraded images. Lower chest: Small right and trace left pleural effusions. Associated dependent atelectasis at the lung bases. Hepatobiliary: Mild hepatic steatosis. No suspicious/enhancing hepatic lesion. Status post cholecystectomy. Associated intrahepatic/extrahepatic ductal dilatation. Common duct measures 12 mm. Distal CBD is poorly evaluated due to motion degradation. However, a 4 mm distal CBD stone is possible (series 4/ image 34), although equivocal. Pancreas: No focal pancreatic mass, ductal dilatation, or atrophy is visualized. Mild peripancreatic stranding/inflammatory changes along the pancreatic head/ uncinate process and pancreatic tail, suggesting acute pancreatitis. No peripancreatic fluid collection/pseudocyst. Postcontrast  imaging is severely motion degraded. Spleen: Within normal limits. Adrenals/Urinary Tract: Adrenal glands are within normal limits. Kidneys are within normal limits.  No hydronephrosis. Stomach/Bowel: Stomach is within normal limits. Visualized bowel is unremarkable. Vascular/Lymphatic: No evidence of abdominal aortic aneurysm. No suspicious abdominopelvic lymphadenopathy. Small upper abdominal lymph nodes in the porta hepatis, likely reactive. Other: Mild stranding along the porta hepatis. Musculoskeletal: No focal osseous lesions. IMPRESSION: Severely motion degraded images. Suspected acute pancreatitis. No peripancreatic fluid collection/pseudocyst. While postcontrast imaging is severely motion degraded, there are no secondary findings suggestive of a pancreatic mass. Status post cholecystectomy. Associated intrahepatic and extrahepatic ductal dilatation. Common duct measures 12 mm.  Possible 4 mm distal CBD stone, although equivocal/poorly evaluated due to motion degradation. Consider ERCP as clinically warranted. Electronically Signed   By: Julian Hy M.D.   On: 10/24/2015 12:25   Dg Chest Port 1 View  Result Date: 10/28/2015 CLINICAL DATA:  Cough, history hypertension EXAM: PORTABLE CHEST 1 VIEW COMPARISON:  Portable exam 1231 hours compared to 04/29/2015 FINDINGS: Enlargement of cardiac silhouette. Mediastinal contours and pulmonary vascularity normal. Bronchitic changes with bibasilar atelectasis greater on RIGHT. Upper lungs clear. No definite pleural effusion or pneumothorax. Bones demineralized. IMPRESSION: Enlargement of cardiac silhouette. Bronchitic changes with bibasilar atelectasis. Electronically Signed   By: Lavonia Dana M.D.   On: 10/28/2015 12:47   Dg Ercp  Result Date: 10/22/2015 CLINICAL DATA:  Biliary obstruction. EXAM: ERCP TECHNIQUE: Multiple spot images obtained with the fluoroscopic device and submitted for interpretation post-procedure. COMPARISON:  CT of the abdomen on  10/22/2015 FINDINGS: Submitted C-arm images from the endoscopic procedure demonstrate cannulation of the common bile duct with contrast injection demonstrating diffuse dilatation of the visualize common bile duct and intrahepatic ducts. There appears to be focal narrowing of the distal CBD/ampulla. A metallic biliary stent was placed spanning across the distal CBD and extending into the duodenum. IMPRESSION: Diffuse biliary dilatation with suggestion of stricture involving the distal CBD and/or ampulla. A metallic biliary stent was placed. These images were submitted for radiologic interpretation only. Please see the procedural report for the amount of contrast and the fluoroscopy time utilized. Electronically Signed   By: Aletta Edouard M.D.   On: 10/22/2015 17:52   Mr Abd W/wo Cm/mrcp  Result Date: 10/24/2015 CLINICAL DATA:  Abdominal pain, elevated lipase, jaundice, possible pancreatic mass on CT EXAM: MRI ABDOMEN WITHOUT AND WITH CONTRAST (INCLUDING MRCP) TECHNIQUE: Multiplanar multisequence MR imaging of the abdomen was performed both before and after the administration of intravenous contrast. Heavily T2-weighted images of the biliary and pancreatic ducts were obtained, and three-dimensional MRCP images were rendered by post processing. CONTRAST:  57mL MULTIHANCE GADOBENATE DIMEGLUMINE 529 MG/ML IV SOLN COMPARISON:  CT abdomen pelvis dated 10/22/2015 FINDINGS: Motion degraded images. Lower chest: Small right and trace left pleural effusions. Associated dependent atelectasis at the lung bases. Hepatobiliary: Mild hepatic steatosis. No suspicious/enhancing hepatic lesion. Status post cholecystectomy. Associated intrahepatic/extrahepatic ductal dilatation. Common duct measures 12 mm. Distal CBD is poorly evaluated due to motion degradation. However, a 4 mm distal CBD stone is possible (series 4/ image 34), although equivocal. Pancreas: No focal pancreatic mass, ductal dilatation, or atrophy is visualized.  Mild peripancreatic stranding/inflammatory changes along the pancreatic head/ uncinate process and pancreatic tail, suggesting acute pancreatitis. No peripancreatic fluid collection/pseudocyst. Postcontrast imaging is severely motion degraded. Spleen: Within normal limits. Adrenals/Urinary Tract: Adrenal glands are within normal limits. Kidneys are within normal limits.  No hydronephrosis. Stomach/Bowel: Stomach is within normal limits. Visualized bowel is unremarkable. Vascular/Lymphatic: No evidence of abdominal aortic aneurysm. No suspicious abdominopelvic lymphadenopathy. Small upper abdominal lymph nodes in the porta hepatis, likely reactive. Other: Mild stranding along the porta hepatis. Musculoskeletal: No focal osseous lesions. IMPRESSION: Severely motion degraded images. Suspected acute pancreatitis. No peripancreatic fluid collection/pseudocyst. While postcontrast imaging is severely motion degraded, there are no secondary findings suggestive of a pancreatic mass. Status post cholecystectomy. Associated intrahepatic and extrahepatic ductal dilatation. Common duct measures 12 mm. Possible 4 mm distal CBD stone, although equivocal/poorly evaluated due to motion degradation. Consider ERCP as clinically warranted. Electronically Signed   By: Julian Hy M.D.   On: 10/24/2015 12:25  Microbiology: Recent Results (from the past 240 hour(s))  MRSA PCR Screening     Status: None   Collection Time: 10/22/15  6:45 PM  Result Value Ref Range Status   MRSA by PCR NEGATIVE NEGATIVE Final    Comment:        The GeneXpert MRSA Assay (FDA approved for NASAL specimens only), is one component of a comprehensive MRSA colonization surveillance program. It is not intended to diagnose MRSA infection nor to guide or monitor treatment for MRSA infections.      Labs: Basic Metabolic Panel:  Recent Labs Lab 10/24/15 0429 10/25/15 0529 10/26/15 0420 10/27/15 0432 10/28/15 0400 10/29/15 0407   NA 134* 135 135 135 137  --   K 4.0 3.8 3.5 3.4* 3.4*  --   CL 101 101 99* 100* 102  --   CO2 26 27 28 29 29   --   GLUCOSE 114* 89 104* 119* 117*  --   BUN 13 14 10 7 6   --   CREATININE 0.60 0.56 0.71 0.54 0.65 0.68  CALCIUM 8.3* 8.2* 8.1* 8.0* 8.0*  --   MG  --  1.4* 1.8 1.8  --   --    Liver Function Tests:  Recent Labs Lab 10/24/15 0429 10/25/15 0529 10/26/15 0420 10/27/15 0432 10/28/15 0400  AST 56* 30 23 20 21   ALT 128* 81* 57* 40 33  ALKPHOS 187* 163* 154* 153* 145*  BILITOT 2.8* 2.8* 2.1* 1.6* 1.2  PROT 5.7* 5.5* 5.7* 5.3* 5.3*  ALBUMIN 3.2* 2.8* 2.7* 2.5* 2.5*    Recent Labs Lab 10/23/15 0418 10/25/15 0529  LIPASE 566* 148*   No results for input(s): AMMONIA in the last 168 hours. CBC:  Recent Labs Lab 10/24/15 0429 10/25/15 0529 10/26/15 0420 10/27/15 0432 10/28/15 0400  WBC 12.1* 12.5* 12.9* 10.8* 11.0*  NEUTROABS 10.8* 10.6* 10.7* 8.8*  --   HGB 13.2 12.1 11.7* 10.9* 11.2*  HCT 37.4 34.5* 33.3* 31.6* 31.7*  MCV 82.7 83.5 83.0 84.0 85.9  PLT 133* 134* 152 181 215   Cardiac Enzymes: No results for input(s): CKTOTAL, CKMB, CKMBINDEX, TROPONINI in the last 168 hours. BNP: BNP (last 3 results) No results for input(s): BNP in the last 8760 hours.  ProBNP (last 3 results) No results for input(s): PROBNP in the last 8760 hours.  CBG: No results for input(s): GLUCAP in the last 168 hours.     SignedKinnie Feil  [facility] 10/29/2015, 10:57 AM

## 2015-11-10 ENCOUNTER — Other Ambulatory Visit: Payer: Self-pay

## 2015-11-10 DIAGNOSIS — K831 Obstruction of bile duct: Secondary | ICD-10-CM

## 2015-11-10 DIAGNOSIS — R17 Unspecified jaundice: Secondary | ICD-10-CM

## 2015-11-10 NOTE — Telephone Encounter (Addendum)
The pt is a resident of [facility] in [place], son has been notified   I have tried to find a number for the facility, I have not been able to find the number call placed to the son for the number or another name of the facility.

## 2015-11-10 NOTE — Telephone Encounter (Signed)
The son called with the number of the facility [facility] [phone].  I called and spoke to April and she asked that I fax the instructions to her at [phone].  I have faxed the instructions. They will call with any questions or concerns.

## 2015-11-24 ENCOUNTER — Encounter (HOSPITAL_COMMUNITY): Payer: Self-pay | Admitting: *Deleted

## 2015-11-24 DIAGNOSIS — Z4659 Encounter for fitting and adjustment of other gastrointestinal appliance and device: Secondary | ICD-10-CM | POA: Diagnosis not present

## 2015-11-24 DIAGNOSIS — K859 Acute pancreatitis without necrosis or infection, unspecified: Secondary | ICD-10-CM | POA: Diagnosis not present

## 2015-11-24 DIAGNOSIS — K219 Gastro-esophageal reflux disease without esophagitis: Secondary | ICD-10-CM | POA: Diagnosis not present

## 2015-11-24 DIAGNOSIS — E039 Hypothyroidism, unspecified: Secondary | ICD-10-CM | POA: Diagnosis not present

## 2015-11-24 DIAGNOSIS — F329 Major depressive disorder, single episode, unspecified: Secondary | ICD-10-CM | POA: Diagnosis not present

## 2015-11-24 DIAGNOSIS — M797 Fibromyalgia: Secondary | ICD-10-CM | POA: Diagnosis not present

## 2015-11-24 DIAGNOSIS — Z885 Allergy status to narcotic agent status: Secondary | ICD-10-CM | POA: Diagnosis not present

## 2015-11-24 DIAGNOSIS — K838 Other specified diseases of biliary tract: Secondary | ICD-10-CM | POA: Diagnosis present

## 2015-11-24 DIAGNOSIS — F419 Anxiety disorder, unspecified: Secondary | ICD-10-CM | POA: Diagnosis not present

## 2015-11-24 DIAGNOSIS — I10 Essential (primary) hypertension: Secondary | ICD-10-CM | POA: Diagnosis not present

## 2015-11-24 DIAGNOSIS — Z888 Allergy status to other drugs, medicaments and biological substances status: Secondary | ICD-10-CM | POA: Diagnosis not present

## 2015-11-24 NOTE — Progress Notes (Addendum)
Preop instructions for: MAIS MARBAN                        Date of Birth  Mar 12, 1934                          Date of Procedure:  11-25-2015     Doctor:JACOBS Time to arrive at [facility]: 845 AM Report to: Admitting Any procedure time changes, MD office will notify you!   Do not eat or drink past midnight the night before your procedure.(To include any tube feedings-must be discontinued)  Take these morning medications only with sips of water.(or give through gastrostomy or feeding tube).DEXILANT, LASTACAFT EYE DROP, LEVOTHYROXINE, ZANAFLEX, HYDROXYZINE, OPTIVE REFRESH EYE DROP   Note: No Insulin or Diabetic meds should be given or taken the morning of the procedure!   Facility contact: [place]                 Phone:    [phone]              Health Care [place]:[id number] SIGNS OWN CONSENTS PER FACILITY  Transportation contact phone#:FACILITY TO [place] PHONE [phone]  Please send day of procedure:current med list and meds last taken that day, confirm nothing by mouth status from what time, Patient Demographic info( to include DNR status, problem list, allergies)   RN contact name/phone#:   [place]  PHONE [phone]                        and Fax [phone]  Bring Insurance card and picture ID Leave all jewelry and other valuables at place where living( no metal or rings to be worn) No contact lens Women-no make-up, no lotions,perfumes,powders Men-no colognes,lotions  Any questions day of procedure,call Endoscopy unit-[phone]!   Sent from :[facility]                   Phone:[phone]                  [phone]  Sent by : Zelphia Cairo RN

## 2015-11-25 ENCOUNTER — Ambulatory Visit (HOSPITAL_COMMUNITY)
Admission: RE | Admit: 2015-11-25 | Discharge: 2015-11-25 | Disposition: A | Discharge status: Nursing Home (Includes ICF) | Payer: Medicare Other | Source: Ambulatory Visit | Attending: Gastroenterology | Admitting: Gastroenterology

## 2015-11-25 ENCOUNTER — Ambulatory Visit (HOSPITAL_COMMUNITY): Payer: Medicare Other | Admitting: Certified Registered Nurse Anesthetist

## 2015-11-25 ENCOUNTER — Telehealth: Payer: Self-pay

## 2015-11-25 ENCOUNTER — Ambulatory Visit (HOSPITAL_COMMUNITY): Payer: Medicare Other

## 2015-11-25 ENCOUNTER — Encounter (HOSPITAL_COMMUNITY): Payer: Self-pay | Admitting: Certified Registered Nurse Anesthetist

## 2015-11-25 ENCOUNTER — Encounter (HOSPITAL_COMMUNITY)
Admission: RE | Disposition: A | Discharge status: Nursing Home (Includes ICF) | Payer: Self-pay | Source: Ambulatory Visit | Attending: Gastroenterology

## 2015-11-25 DIAGNOSIS — K831 Obstruction of bile duct: Secondary | ICD-10-CM

## 2015-11-25 DIAGNOSIS — Z885 Allergy status to narcotic agent status: Secondary | ICD-10-CM | POA: Insufficient documentation

## 2015-11-25 DIAGNOSIS — K859 Acute pancreatitis without necrosis or infection, unspecified: Secondary | ICD-10-CM | POA: Diagnosis not present

## 2015-11-25 DIAGNOSIS — Z888 Allergy status to other drugs, medicaments and biological substances status: Secondary | ICD-10-CM | POA: Insufficient documentation

## 2015-11-25 DIAGNOSIS — K838 Other specified diseases of biliary tract: Secondary | ICD-10-CM

## 2015-11-25 DIAGNOSIS — R932 Abnormal findings on diagnostic imaging of liver and biliary tract: Secondary | ICD-10-CM

## 2015-11-25 DIAGNOSIS — I10 Essential (primary) hypertension: Secondary | ICD-10-CM | POA: Insufficient documentation

## 2015-11-25 DIAGNOSIS — F419 Anxiety disorder, unspecified: Secondary | ICD-10-CM | POA: Insufficient documentation

## 2015-11-25 DIAGNOSIS — F329 Major depressive disorder, single episode, unspecified: Secondary | ICD-10-CM | POA: Insufficient documentation

## 2015-11-25 DIAGNOSIS — Z4659 Encounter for fitting and adjustment of other gastrointestinal appliance and device: Secondary | ICD-10-CM | POA: Diagnosis not present

## 2015-11-25 DIAGNOSIS — M797 Fibromyalgia: Secondary | ICD-10-CM | POA: Insufficient documentation

## 2015-11-25 DIAGNOSIS — E039 Hypothyroidism, unspecified: Secondary | ICD-10-CM | POA: Insufficient documentation

## 2015-11-25 DIAGNOSIS — K219 Gastro-esophageal reflux disease without esophagitis: Secondary | ICD-10-CM | POA: Insufficient documentation

## 2015-11-25 DIAGNOSIS — R17 Unspecified jaundice: Secondary | ICD-10-CM

## 2015-11-25 HISTORY — DX: Vitamin a deficiency, unspecified: E50.9

## 2015-11-25 HISTORY — PX: EUS: SHX5427

## 2015-11-25 HISTORY — DX: Fibromyalgia: M79.7

## 2015-11-25 HISTORY — PX: ERCP: SHX5425

## 2015-11-25 HISTORY — DX: Hypothyroidism, unspecified: E03.9

## 2015-11-25 SURGERY — ESOPHAGEAL ENDOSCOPIC ULTRASOUND (EUS) RADIAL
Anesthesia: General

## 2015-11-25 MED ORDER — ROCURONIUM BROMIDE 10 MG/ML (PF) SYRINGE
PREFILLED_SYRINGE | INTRAVENOUS | Status: DC | PRN
  Administered 2015-11-25: 30 mg via INTRAVENOUS

## 2015-11-25 MED ORDER — SODIUM CHLORIDE 0.9 % IV SOLN
INTRAVENOUS | Status: DC | PRN
  Administered 2015-11-25: 43 mL

## 2015-11-25 MED ORDER — INDOMETHACIN 50 MG RE SUPP
RECTAL | Status: DC | PRN
  Administered 2015-11-25: 100 mg via RECTAL

## 2015-11-25 MED ORDER — ROCURONIUM BROMIDE 10 MG/ML (PF) SYRINGE
PREFILLED_SYRINGE | INTRAVENOUS | Status: AC
  Filled 2015-11-25: qty 10

## 2015-11-25 MED ORDER — PHENYLEPHRINE 40 MCG/ML (10ML) SYRINGE FOR IV PUSH (FOR BLOOD PRESSURE SUPPORT)
PREFILLED_SYRINGE | INTRAVENOUS | Status: DC | PRN
  Administered 2015-11-25: 200 ug via INTRAVENOUS

## 2015-11-25 MED ORDER — LIDOCAINE 2% (20 MG/ML) 5 ML SYRINGE
INTRAMUSCULAR | Status: AC
  Filled 2015-11-25: qty 5

## 2015-11-25 MED ORDER — CIPROFLOXACIN IN D5W 400 MG/200ML IV SOLN
INTRAVENOUS | Status: DC | PRN
  Administered 2015-11-25: 400 mg via INTRAVENOUS

## 2015-11-25 MED ORDER — SUGAMMADEX SODIUM 500 MG/5ML IV SOLN
INTRAVENOUS | Status: AC
  Filled 2015-11-25: qty 5

## 2015-11-25 MED ORDER — PROPOFOL 10 MG/ML IV BOLUS
INTRAVENOUS | Status: DC | PRN
  Administered 2015-11-25: 50 mg via INTRAVENOUS
  Administered 2015-11-25: 120 mg via INTRAVENOUS

## 2015-11-25 MED ORDER — FENTANYL CITRATE (PF) 100 MCG/2ML IJ SOLN
INTRAMUSCULAR | Status: DC | PRN
  Administered 2015-11-25 (×2): 50 ug via INTRAVENOUS

## 2015-11-25 MED ORDER — GLUCAGON HCL RDNA (DIAGNOSTIC) 1 MG IJ SOLR
INTRAMUSCULAR | Status: AC
  Filled 2015-11-25: qty 1

## 2015-11-25 MED ORDER — SUCCINYLCHOLINE CHLORIDE 200 MG/10ML IV SOSY
PREFILLED_SYRINGE | INTRAVENOUS | Status: DC | PRN
  Administered 2015-11-25: 100 mg via INTRAVENOUS

## 2015-11-25 MED ORDER — CIPROFLOXACIN IN D5W 400 MG/200ML IV SOLN
INTRAVENOUS | Status: AC
  Filled 2015-11-25: qty 200

## 2015-11-25 MED ORDER — ONDANSETRON HCL 4 MG/2ML IJ SOLN
INTRAMUSCULAR | Status: DC | PRN
  Administered 2015-11-25: 4 mg via INTRAVENOUS

## 2015-11-25 MED ORDER — SODIUM CHLORIDE 0.9 % IV SOLN: INTRAVENOUS | Status: DC

## 2015-11-25 MED ORDER — SUGAMMADEX SODIUM 200 MG/2ML IV SOLN
INTRAVENOUS | Status: AC
  Filled 2015-11-25: qty 2

## 2015-11-25 MED ORDER — LIDOCAINE 2% (20 MG/ML) 5 ML SYRINGE
INTRAMUSCULAR | Status: DC | PRN
Start: 2015-11-25 — End: 2015-11-25
  Administered 2015-11-25: 80 mg via INTRAVENOUS

## 2015-11-25 MED ORDER — SUGAMMADEX SODIUM 200 MG/2ML IV SOLN
INTRAVENOUS | Status: DC | PRN
  Administered 2015-11-25: 200 mg via INTRAVENOUS

## 2015-11-25 MED ORDER — INDOMETHACIN 50 MG RE SUPP
RECTAL | Status: AC
  Filled 2015-11-25: qty 2

## 2015-11-25 MED ORDER — ONDANSETRON HCL 4 MG/2ML IJ SOLN
INTRAMUSCULAR | Status: AC
  Filled 2015-11-25: qty 2

## 2015-11-25 MED ORDER — FENTANYL CITRATE (PF) 100 MCG/2ML IJ SOLN
INTRAMUSCULAR | Status: AC
  Filled 2015-11-25: qty 2

## 2015-11-25 MED ORDER — LACTATED RINGERS IV SOLN
INTRAVENOUS | Status: DC | PRN
  Administered 2015-11-25 (×2): via INTRAVENOUS

## 2015-11-25 MED ORDER — PROPOFOL 10 MG/ML IV BOLUS
INTRAVENOUS | Status: AC
  Filled 2015-11-25: qty 20

## 2015-11-25 NOTE — Interval H&P Note (Signed)
  We discussed risks of the procedures, specifically pancreatitis, perforation, bleeding.  She agreed to proceed.

## 2015-11-25 NOTE — Interval H&P Note (Signed)
History and Physical Interval Note:  11/25/2015 9:45 AM  Erika Conley  has presented today for surgery, with the diagnosis of dilated common bile duct, jaundice   The various methods of treatment have been discussed with the patient and family. After consideration of risks, benefits and other options for treatment, the patient has consented to  Procedure(s): ESOPHAGEAL ENDOSCOPIC ULTRASOUND (EUS) RADIAL (N/A) ENDOSCOPIC RETROGRADE CHOLANGIOPANCREATOGRAPHY (ERCP) (N/A) as a surgical intervention .  The patient's history has been reviewed, patient examined, no change in status, stable for surgery.  I have reviewed the patient's chart and labs.  Questions were answered to the patient's satisfaction.     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 CT scan suggested pancreatic mass (radiologist reading but to my review I did not see overt mass.  MRI showed no pancreatic mass.  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  5cm long covered SEMS was placed with normalization of LFTs. She is still having left flank pain. This pain was present before the ERCP.  Not sure that the left flank pain is related to her bile duct stricture.  She is frustrated about the uncertainty and I am as well.  Plan today to to remove the metal stent, interrogate the pancreas and bile dcut with EUS (FNA if mass noted), then repeat ERCP to interrogate the distal bile duct better.

## 2015-11-25 NOTE — Op Note (Signed)
[facility] Patient Name: Erika Conley Procedure Date: 11/25/2015 MRN: [id number] Attending MD: Milus Banister , MD Date of Birth: [birth year] CSN: [id number] Age: 81 Admit Type: Inpatient Procedure:                Upper EUS Indications:              Common bile duct dilation (etiology unknown) seen                            on CT scan; presented with abd pain, elevated liver                            tests (T bili 8.71max) and CT scan showed                            intra/extra hepatic biliary dilation and                            'suggestion of" a mass in pancreatic head. ERCP Dr.                            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 MRI 2 days                            later showed NO pancreatic mass. Providers:                Milus Banister, MD, Dortha Schwalbe RN, RN,                            William Dalton, Technician Referring MD:              Medicines:                General Anesthesia Complications:            No immediate complications. Estimated blood loss:                            None. Estimated Blood Loss:     Estimated blood loss: none. Procedure:                Pre-Anesthesia Assessment:                           - Prior to the procedure, a History and Physical                            was performed, and patient medications and  allergies were reviewed. The patient's tolerance of                            previous anesthesia was also reviewed. The risks                            and benefits of the procedure and the sedation                            options and risks were discussed with the patient.                            All questions were answered, and informed consent                            was obtained. Prior Anticoagulants: The  patient has                            taken no previous anticoagulant or antiplatelet                            agents. ASA Grade Assessment: II - A patient with                            mild systemic disease. After reviewing the risks                            and benefits, the patient was deemed in                            satisfactory condition to undergo the procedure.                           After obtaining informed consent, the endoscope was                            passed under direct vision. Throughout the                            procedure, the patient's blood pressure, pulse, and                            oxygen saturations were monitored continuously. The                            [id number]) scope was introduced through                            the mouth, and advanced to the second part of                            duodenum. The upper EUS was accomplished without  difficulty. The patient tolerated the procedure                            well. Findings:      Endoscopic Finding :      1. The previously placed metal biliary stent was in good position       extenting into the duodenum and it was removed with forceps prior to       evaluation with EUS.      Endosonographic Finding :      1. CBD wall was diffusely thickened (51mm) without focal mass lesion.       There was a small amount of air artifact (bubbles) in the bile duct. CBD       diameter 8-60mm.      2. The pancreatic duct was normal (15mm diameter in head, tapering       smoothly into the neck, body and tail.      3. Pancreatic parenchyma was normal; no discrete mass lesions.      4. No peripancreatic adenopathy.      5. Limited views of liver, spleen, portal and splenic vessels were all       normal.      Images from this procedure are all on the ERCP report from today. Impression:               - Previously placed metal bile duct stent was in                             good position, this was removed with forceps.                           - EUS examination shows diffusely thickened CBD                            wall (68mm), this is very likely reactive                            inflammation from the bile duct stent that has been                            in place for past 5 weeks.                           - Otherwise EUS examination was normal: no clear                            mass lesions in head of pancreas, bile duct. Moderate Sedation:      N/A- Per Anesthesia Care Recommendation:           - ERCP now (all the images are on the ERCP report. Procedure Code(s):        --- Professional ---                           [phone], Esophagogastroduodenoscopy, flexible,                            transoral; with endoscopic ultrasound  examination                            limited to the esophagus, stomach or duodenum, and                            adjacent structures Diagnosis Code(s):        --- Professional ---                           R93.2, Abnormal findings on diagnostic imaging of                            liver and biliary tract CPT copyright 2016 American Medical Association. All rights reserved. The codes documented in this report are preliminary and upon coder review may  be revised to meet current compliance requirements. Milus Banister, MD 11/25/2015 11:15:59 AM This report has been signed electronically. Number of Addenda: 0

## 2015-11-25 NOTE — Op Note (Addendum)
[facility] Patient Name: Erika Conley Procedure Date: 11/25/2015 MRN: [id number] Attending MD: Milus Banister , MD Date of Birth: [birth year] CSN: [id number] Age: 81 Admit Type: Inpatient Procedure:                ERCP Indications:              Biliary dilation on Computed Tomogram Scan (see EUS                            from earlier today) Providers:                Milus Banister, MD, Dortha Schwalbe RN, RN,                            William Dalton, Technician Referring MD:              Medicines:                General Anesthesia, Cipro 400 mg IV, Indomethacin                            123XX123 mg PR Complications:            No immediate complications. Estimated blood loss:                            None Estimated Blood Loss:     Estimated blood loss: none. Procedure:                Pre-Anesthesia Assessment:                           - Prior to the procedure, a History and Physical                            was performed, and patient medications and                            allergies were reviewed. The patient's tolerance of                            previous anesthesia was also reviewed. The risks                            and benefits of the procedure and the sedation                            options and risks were discussed with the patient.                            All questions were answered, and informed consent                            was obtained. Prior Anticoagulants: The patient has                            taken no  previous anticoagulant or antiplatelet                            agents. ASA Grade Assessment: II - A patient with                            mild systemic disease. After reviewing the risks                            and benefits, the patient was deemed in                            satisfactory condition to undergo the procedure.                           After obtaining informed consent, the scope was                             passed under direct vision. Throughout the                            procedure, the patient's blood pressure, pulse, and                            oxygen saturations were monitored continuously. The                            [id number]) scope was introduced through                            the mouth, and used to inject contrast into and                            used to inject contrast into the bile duct. The                            ERCP was accomplished without difficulty. The                            patient tolerated the procedure well. Scope In: Scope Out: Findings:      The scout film was normal. The esophagus was successfully intubated       under direct vision. The scope was advanced to a normal major papilla in       the descending duodenum without detailed examination of the pharynx,       larynx and associated structures, and upper GI tract. The upper GI tract       was grossly normal. The previous bile duct stent had been removed just       prior to this procedure (see EUS report) and the underlying ampullary       mucosa was edematous, inflamed appearing but not overtly neoplastic.       Yellow bile was flowing from the site even before attempts at       cannulation. A 44 [employer]ot[employer] over a .035 hydrawire was used  to cannulate       the bile duct and contrast was injected.The bile duct was deeply       cannulated. Contrast was injected. The extrahepatic biliary tree was       slightly dilated, CBD diameter 9-97mm and it tapered smoothly into the       region of the major papilla. No clear strictures noted. Given my concern       for underlying papillary stenosis and to better examine the distal CBD       visually I performed biliary sphincterotomy (6-61mm cut). 12-42mm biliary       balloon was used to sweep the bile duct. Stent related debris but no       stones, no purulence was delivered into the duodenum. No overt tumor,       mass was noted. The  balloon at 15mm easily passed through the       sphincterotomy site. Finally, mucosal biopsies were taken from the       distal-most bile duct (es exposed after shincterotomy) as well as the       periampullary mucosa. The main pancreatic duct was never cannulated with       wire or injected with dye. Impression:               - The previous biliary stent was removed during EUS                            just prior to this procedure (stent was sent to                            cytology). Stent was not replaced.                           - No overt neoplastic mucosa in periampulla or                            distal CBD after bilary sphincterotomy (biopsies                            taken to be more certain).                           - I do not think that the biliary findings explain                            her left flank pain which is her predominant                            symptom. Moderate Sedation:      N/A- Per Anesthesia Care Recommendation:           - Discharge patient to a nursing home.                           - LFTs in 4 weeks with the results sent to Dr.                            Ardis Hughs                           -  Office visit with Dr. Ardis Hughs in 6-7 weeks (my                            office will call to schedule this). Procedure Code(s):        --- Professional ---                           [phone], Endoscopic retrograde                            cholangiopancreatography (ERCP); with                            sphincterotomy/papillotomy Diagnosis Code(s):        --- Professional ---                           K83.8, Other specified diseases of biliary tract CPT copyright 2016 American Medical Association. All rights reserved. The codes documented in this report are preliminary and upon coder review may  be revised to meet current compliance requirements. Milus Banister, MD 11/25/2015 12:08:02 PM This report has been signed electronically. Number of Addenda: 0

## 2015-11-25 NOTE — Addendum Note (Signed)
Addendum  created 11/25/15 1320 by Albertha Ghee, MD   Sign clinical note

## 2015-11-25 NOTE — Transfer of Care (Signed)
Immediate Anesthesia Transfer of Care Note  Patient: Erika Conley  Procedure(s) Performed: Procedure(s): ESOPHAGEAL ENDOSCOPIC ULTRASOUND (EUS) RADIAL (N/A) ENDOSCOPIC RETROGRADE CHOLANGIOPANCREATOGRAPHY (ERCP) (N/A)  Patient Location: PACU  Anesthesia Type:General  Level of Consciousness:  sedated, patient cooperative and responds to stimulation  Airway & Oxygen Therapy:Patient Spontanous Breathing and Patient connected to face mask oxgen  Post-op Assessment:  Report given to PACU RN and Post -op Vital signs reviewed and stable  Post vital signs:  Reviewed and stable  Last Vitals:  Vitals:   11/25/15 1004  BP: (!) 163/61  Pulse: 72  Resp: 15  Temp: [id number] C    Complications: No apparent anesthesia complications

## 2015-11-25 NOTE — Anesthesia Preprocedure Evaluation (Signed)
Anesthesia Evaluation  Patient identified by MRN, date of birth, ID band Patient awake    Reviewed: Allergy & Precautions, H&P , NPO status , Patient's Chart, lab work & pertinent test results  Airway Mallampati: II   Neck ROM: full    Dental   Pulmonary neg pulmonary ROS,    breath sounds clear to auscultation       Cardiovascular hypertension,  Rhythm:regular Rate:Normal     Neuro/Psych Anxiety Depression    GI/Hepatic GERD  ,  Endo/Other  Hypothyroidism   Renal/GU      Musculoskeletal  (+) Arthritis , Fibromyalgia -  Abdominal   Peds  Hematology   Anesthesia Other Findings   Reproductive/Obstetrics                             Anesthesia Physical Anesthesia Plan  ASA: II  Anesthesia Plan: General   Post-op Pain Management:    Induction: Intravenous  Airway Management Planned: Oral ETT  Additional Equipment:   Intra-op Plan:   Post-operative Plan: Extubation in OR  Informed Consent: I have reviewed the patients History and Physical, chart, labs and discussed the procedure including the risks, benefits and alternatives for the proposed anesthesia with the patient or authorized representative who has indicated his/her understanding and acceptance.     Plan Discussed with: CRNA, Anesthesiologist and Surgeon  Anesthesia Plan Comments:         Anesthesia Quick Evaluation

## 2015-11-25 NOTE — Anesthesia Postprocedure Evaluation (Addendum)
Anesthesia Post Note  Patient: Erika Conley  Procedure(s) Performed: Procedure(s) (LRB): ESOPHAGEAL ENDOSCOPIC ULTRASOUND (EUS) RADIAL (N/A) ENDOSCOPIC RETROGRADE CHOLANGIOPANCREATOGRAPHY (ERCP) (N/A)  Patient location during evaluation: PACU Anesthesia Type: General Level of consciousness: awake and alert Pain management: pain level controlled Vital Signs Assessment: post-procedure vital signs reviewed and stable Respiratory status: spontaneous breathing, nonlabored ventilation, respiratory function stable and patient connected to nasal cannula oxygen Cardiovascular status: stable and blood pressure returned to baseline Anesthetic complications: no    Last Vitals:  Vitals:   11/25/15 1215 11/25/15 1220  BP: 121/76 (!) 188/89  Pulse:    Resp:    Temp:      Last Pain:  Vitals:   11/25/15 1205  TempSrc: Oral  PainSc:                  [place]

## 2015-11-25 NOTE — Telephone Encounter (Signed)
Appt info faxed to [facility]  Also notified that pt needs LFT's in 4 weeks.

## 2015-11-25 NOTE — Telephone Encounter (Signed)
-----   Message from Milus Banister, MD sent at 11/25/2015 12:13 PM EDT ----- Marykay Lex, She needs LFTs in 4 weeks (can be done at her NH and results sent to me). She needs rov with me in 6-8 weeks.  Thanks  dj

## 2015-11-25 NOTE — H&P (View-Only) (Signed)
[facility] Progress Note  Subjective:  Tolerating full liquids.  Mild abdominal pain this AM.  Main complaint is being tired and weak.  Today's labs pending.  Objective:  Vital signs in last 24 hours: Temp:  [98.9 F (37.2 C)-100.1 F (37.8 C)] 98.9 F (37.2 C) (09/06 0541) Pulse Rate:  [62-77] 62 (09/06 0541) Resp:  [16-20] 20 (09/06 0541) BP: (120-144)/(58-66) 132/58 (09/06 0541) SpO2:  [90 %-96 %] 96 % (09/06 0541) Last BM Date: 10/26/15 General:  Alert, Well-developed, in NAD Heart:  Regular rate and rhythm; SEM noted. Pulm:  CTAB.  No W/R/R. Abdomen:  Soft, non-distended.  BS present.  Mild epigastric TTP. Extremities:  Without edema. Neurologic:  Alert and oriented x 4;  grossly normal neurologically. Psych:  Alert and cooperative. Normal mood and affect.  Intake/Output from previous day: 09/05 0701 - 09/06 0700 In: 2027.5 [I.V.:2027.5] Out: -   Lab Results:  Recent Labs  10/25/15 0529 10/26/15 0420  WBC 12.5* 12.9*  HGB 12.1 11.7*  HCT 34.5* 33.3*  PLT 134* 152   BMET  Recent Labs  10/25/15 0529 10/26/15 0420  NA 135 135  K 3.8 3.5  CL 101 99*  CO2 27 28  GLUCOSE 89 104*  BUN 14 10  CREATININE 0.56 0.71  CALCIUM 8.2* 8.1*   LFT  Recent Labs  10/26/15 0420  PROT 5.7*  ALBUMIN 2.7*  AST 23  ALT 57*  ALKPHOS 154*  BILITOT 2.1*   Dg Abd 1 View  Result Date: 10/26/2015 CLINICAL DATA:  Biliary stent location. Not seen on recent MRI. A biliary stent was placed on 10/22/2015. EXAM: ABDOMEN - 1 VIEW COMPARISON:  Abdominal MRI 10/24/2015 and ERCP 10/22/2015 FINDINGS: The bowel gas pattern is normal. A metallic stent measuring approximately 5 cm in length projects over the right upper quadrant just to the right of midline and appears stable in position and orientation oriented as it did on images from an ERCP dated 10/22/2015. Surgical clips are present in the right upper quadrant. Pneumobilia is noted. There is a sliver of a  amorphous high density in the left upper quadrant which could potentially be related to contrast in the stomach from one of the patient's recent studies. Vertebroplasty changes are noted at T11 and T12. IMPRESSION: 1. Metallic biliary stent projects over the right upper quadrant and appears similar in position compared to images from the ERCP dated 10/22/2015. 2. Pneumobilia, this can be seen in the setting of biliary stent. 3. Small amount of high density in the left upper quadrant may be contrast material within the stomach. Electronically Signed   By: Curlene Dolphin M.D.   On: 10/26/2015 12:24   Assessment / Plan: *81 year old female with epigastric abdominal pain and CT scan showing likely pancreatic head mass and biliary ductal dilation, ERCP 10/22/15 with bile duct brushing and stent placement, cytology results pending.  MRI does not show pancreatic mass but CA 19-9 elevated at 119, CEA minimally elevated at 4.8.  LFT's have been trending down since procedure. *Post-ERCP pancreatitis:  Lipase trending down and patient is improving clinically.  -Continue full liquids with further advancement as tolerated. -Dr. Ardis Hughs is arranging for outpatient EUS/ERCP with stent removal in about 4-5 weeks and we will contact her with that information. -Will follow-up cytology as well. -Continue supportive measures. -Today's labs pending.   LOS: 5 days   ZEHR, JESSICA D.  10/27/2015, 8:55 AM  Pager number [phone]  GI ATTENDING  Interval  history data reviewed. Agree with interval progress note. Patient stable. Tolerating diet. Biliary stent functioning well. No further recommendations from GI standpoint. Should be sent home with antiemetics and oral analgesics if needed. GI follow-up with Dr. Ardis Hughs as planned.  Docia Chuck. Geri [place]in[place]., M.D. [facility] Division of Gastroenterology

## 2015-11-25 NOTE — Discharge Instructions (Signed)
YOU HAD AN ENDOSCOPIC PROCEDURE TODAY: Refer to the procedure report and other information in the discharge instructions given to you for any specific questions about what was found during the examination. If this information does not answer your questions, please call [place] office at [phone] to clarify.  ° °YOU SHOULD EXPECT: Some feelings of bloating in the abdomen. Passage of more gas than usual. Walking can help get rid of the air that was put into your GI tract during the procedure and reduce the bloating. If you had a lower endoscopy (such as a colonoscopy or flexible sigmoidoscopy) you may notice spotting of blood in your stool or on the toilet paper. Some abdominal soreness may be present for a day or two, also. ° °DIET: Your first meal following the procedure should be a light meal and then it is ok to progress to your normal diet. A half-sandwich or bowl of soup is an example of a good first meal. Heavy or fried foods are harder to digest and may make you feel nauseous or bloated. Drink plenty of fluids but you should avoid alcoholic beverages for 24 hours. If you had a esophageal dilation, please see attached instructions for diet.   ° °ACTIVITY: Your care partner should take you home directly after the procedure. You should plan to take it easy, moving slowly for the rest of the day. You can resume normal activity the day after the procedure however YOU SHOULD NOT DRIVE, use power tools, machinery or perform tasks that involve climbing or major physical exertion for 24 hours (because of the sedation medicines used during the test).  ° °SYMPTOMS TO REPORT IMMEDIATELY: °A gastroenterologist can be reached at any hour. Please call [phone]  for any of the following symptoms:  °Following lower endoscopy (colonoscopy, flexible sigmoidoscopy) °Excessive amounts of blood in the stool  °Significant tenderness, worsening of abdominal pains  °Swelling of the abdomen that is new, acute  °Fever of 100° or  higher  °Following upper endoscopy (EGD, EUS, ERCP, esophageal dilation) °Vomiting of blood or coffee ground material  °New, significant abdominal pain  °New, significant chest pain or pain under the shoulder blades  °Painful or persistently difficult swallowing  °New shortness of breath  °Black, tarry-looking or red, bloody stools ° °FOLLOW UP:  °If any biopsies were taken you will be contacted by phone or by letter within the next 1-3 weeks. Call [phone]  if you have not heard about the biopsies in 3 weeks.  °Please also call with any specific questions about appointments or follow up tests. ° °

## 2015-11-25 NOTE — Anesthesia Procedure Notes (Signed)
Procedure Name: Intubation Date/Time: 11/25/2015 10:22 AM Performed by: Montel Clock Pre-anesthesia Checklist: Patient identified, Emergency Drugs available, Suction available, Patient being monitored and Timeout performed Patient Re-evaluated:Patient Re-evaluated prior to inductionOxygen Delivery Method: Circle system utilized Preoxygenation: Pre-oxygenation with 100% oxygen Intubation Type: IV induction Laryngoscope Size: Mac and 3 Grade View: Grade I Tube type: Oral Tube size: 7.0 mm Number of attempts: 1 Airway Equipment and Method: Stylet Placement Confirmation: ETT inserted through vocal cords under direct vision,  positive ETCO2 and breath sounds checked- equal and bilateral Secured at: 21 cm Tube secured with: Tape Dental Injury: Teeth and Oropharynx as per pre-operative assessment

## 2015-11-26 ENCOUNTER — Encounter (HOSPITAL_COMMUNITY): Payer: Self-pay | Admitting: Gastroenterology

## 2015-12-13 ENCOUNTER — Encounter (HOSPITAL_COMMUNITY): Payer: Self-pay | Admitting: Emergency Medicine

## 2015-12-13 ENCOUNTER — Emergency Department (HOSPITAL_COMMUNITY): Payer: Medicare Other

## 2015-12-13 ENCOUNTER — Inpatient Hospital Stay (HOSPITAL_COMMUNITY)
Admission: EM | Admit: 2015-12-13 | Discharge: 2015-12-16 | DRG: 645 | Disposition: A | Discharge status: Home Health | Payer: Medicare Other | Attending: Family Medicine | Admitting: Family Medicine

## 2015-12-13 DIAGNOSIS — R300 Dysuria: Secondary | ICD-10-CM | POA: Diagnosis present

## 2015-12-13 DIAGNOSIS — Z888 Allergy status to other drugs, medicaments and biological substances status: Secondary | ICD-10-CM

## 2015-12-13 DIAGNOSIS — Z79891 Long term (current) use of opiate analgesic: Secondary | ICD-10-CM

## 2015-12-13 DIAGNOSIS — K529 Noninfective gastroenteritis and colitis, unspecified: Secondary | ICD-10-CM | POA: Diagnosis present

## 2015-12-13 DIAGNOSIS — Z7951 Long term (current) use of inhaled steroids: Secondary | ICD-10-CM

## 2015-12-13 DIAGNOSIS — E876 Hypokalemia: Secondary | ICD-10-CM | POA: Diagnosis present

## 2015-12-13 DIAGNOSIS — R112 Nausea with vomiting, unspecified: Secondary | ICD-10-CM | POA: Diagnosis present

## 2015-12-13 DIAGNOSIS — Z885 Allergy status to narcotic agent status: Secondary | ICD-10-CM

## 2015-12-13 DIAGNOSIS — Z23 Encounter for immunization: Secondary | ICD-10-CM

## 2015-12-13 DIAGNOSIS — Z9689 Presence of other specified functional implants: Secondary | ICD-10-CM

## 2015-12-13 DIAGNOSIS — E860 Dehydration: Secondary | ICD-10-CM | POA: Diagnosis present

## 2015-12-13 DIAGNOSIS — R110 Nausea: Secondary | ICD-10-CM

## 2015-12-13 DIAGNOSIS — I10 Essential (primary) hypertension: Secondary | ICD-10-CM | POA: Diagnosis not present

## 2015-12-13 DIAGNOSIS — J302 Other seasonal allergic rhinitis: Secondary | ICD-10-CM | POA: Diagnosis present

## 2015-12-13 DIAGNOSIS — R296 Repeated falls: Secondary | ICD-10-CM | POA: Diagnosis present

## 2015-12-13 DIAGNOSIS — M1990 Unspecified osteoarthritis, unspecified site: Secondary | ICD-10-CM | POA: Diagnosis present

## 2015-12-13 DIAGNOSIS — E222 Syndrome of inappropriate secretion of antidiuretic hormone: Principal | ICD-10-CM | POA: Diagnosis present

## 2015-12-13 DIAGNOSIS — Z66 Do not resuscitate: Secondary | ICD-10-CM | POA: Diagnosis present

## 2015-12-13 DIAGNOSIS — R9431 Abnormal electrocardiogram [ECG] [EKG]: Secondary | ICD-10-CM

## 2015-12-13 DIAGNOSIS — R109 Unspecified abdominal pain: Secondary | ICD-10-CM | POA: Diagnosis present

## 2015-12-13 DIAGNOSIS — M797 Fibromyalgia: Secondary | ICD-10-CM | POA: Diagnosis present

## 2015-12-13 DIAGNOSIS — E039 Hypothyroidism, unspecified: Secondary | ICD-10-CM | POA: Diagnosis present

## 2015-12-13 DIAGNOSIS — K219 Gastro-esophageal reflux disease without esophagitis: Secondary | ICD-10-CM | POA: Diagnosis present

## 2015-12-13 DIAGNOSIS — E871 Hypo-osmolality and hyponatremia: Secondary | ICD-10-CM | POA: Diagnosis present

## 2015-12-13 DIAGNOSIS — T502X5A Adverse effect of carbonic-anhydrase inhibitors, benzothiadiazides and other diuretics, initial encounter: Secondary | ICD-10-CM | POA: Diagnosis present

## 2015-12-13 DIAGNOSIS — F329 Major depressive disorder, single episode, unspecified: Secondary | ICD-10-CM | POA: Diagnosis present

## 2015-12-13 LAB — LIPASE, BLOOD: LIPASE: 23 U/L (ref 11–51)

## 2015-12-13 LAB — CBC WITH DIFFERENTIAL/PLATELET
BASOS PCT: 0 %
Basophils Absolute: 0 10*3/uL (ref 0.0–0.1)
EOS ABS: 0 10*3/uL (ref 0.0–0.7)
EOS PCT: 0 %
HCT: 39.8 % (ref 36.0–46.0)
HEMOGLOBIN: 14.8 g/dL (ref 12.0–15.0)
LYMPHS PCT: 9 %
Lymphs Abs: 0.7 10*3/uL (ref 0.7–4.0)
MCH: 29.1 pg (ref 26.0–34.0)
MCHC: 37.2 g/dL — ABNORMAL HIGH (ref 30.0–36.0)
MCV: 78.2 fL (ref 78.0–100.0)
MONO ABS: 0.3 10*3/uL (ref 0.1–1.0)
Monocytes Relative: 4 %
NEUTROS PCT: 87 %
Neutro Abs: 6.4 10*3/uL (ref 1.7–7.7)
PLATELETS: 207 10*3/uL (ref 150–400)
RBC: 5.09 MIL/uL (ref 3.87–5.11)
RDW: 12.6 % (ref 11.5–15.5)
WBC: 7.4 10*3/uL (ref 4.0–10.5)

## 2015-12-13 LAB — BASIC METABOLIC PANEL
ANION GAP: 10 (ref 5–15)
BUN: 11 mg/dL (ref 6–20)
CHLORIDE: 81 mmol/L — AB (ref 101–111)
CO2: 28 mmol/L (ref 22–32)
Calcium: 8.5 mg/dL — ABNORMAL LOW (ref 8.9–10.3)
Creatinine, Ser: 0.46 mg/dL (ref 0.44–1.00)
GFR calc Af Amer: >60 mL/min (ref >60)
Glucose, Bld: 128 mg/dL — ABNORMAL HIGH (ref 65–99)
POTASSIUM: 3.3 mmol/L — AB (ref 3.5–5.1)
SODIUM: 119 mmol/L — AB (ref 135–145)

## 2015-12-13 MED ORDER — DEXTROSE-NACL 5-0.45 % IV SOLN: INTRAVENOUS | Status: DC

## 2015-12-13 MED ORDER — SODIUM CHLORIDE 0.9 % IV BOLUS (SEPSIS)
1000.0000 mL | Freq: Once | INTRAVENOUS | Status: AC
  Administered 2015-12-14: 1000 mL via INTRAVENOUS

## 2015-12-13 MED ORDER — SODIUM CHLORIDE 0.9 % IV BOLUS (SEPSIS)
1000.0000 mL | Freq: Once | INTRAVENOUS | Status: AC
  Administered 2015-12-13: 1000 mL via INTRAVENOUS

## 2015-12-13 MED ORDER — ONDANSETRON HCL 4 MG/2ML IJ SOLN
4.0000 mg | Freq: Once | INTRAMUSCULAR | Status: AC
  Administered 2015-12-13: 4 mg via INTRAVENOUS
  Filled 2015-12-13: qty 2

## 2015-12-13 NOTE — ED Triage Notes (Signed)
Per EMS pt is from [facility]. Pt has been experiencing nausea and vomiting since 1400. Facility staff members report giving pt antiemetics with no relief. Pt reported to EMS that she has been experiencing burning with urination. Pt has a hx of altered mental status. EMS states pt is currently AOX4.

## 2015-12-13 NOTE — ED Provider Notes (Signed)
[facility] DEPT Provider Note   CSN: [id number] Arrival date & time: 12/13/15  2127     History   Chief Complaint Chief Complaint  Patient presents with  . Nausea  . Dysuria    HPI Erika Conley is a 81 y.o. female.   Dysuria   This is a recurrent problem. The current episode started 6 to 12 hours ago. The problem occurs every urination. The problem has not changed since onset.The quality of the pain is described as burning and stabbing. The pain is moderate. There has been no fever. Associated symptoms include chills, nausea and vomiting. She has tried nothing for the symptoms.  Emesis   This is a recurrent problem. The current episode started 6 to 12 hours ago. The problem occurs 2 to 4 times per day. The problem has been gradually improving. The emesis has an appearance of stomach contents. There has been no fever. The fever has been present for less than 1 day. Associated symptoms include abdominal pain (epigastric) and chills.    Past Medical History:  Diagnosis Date  . Anxiety   . Arthritis   . Depression   . Falls frequently   . Fibromyalgia   . Fracture, clavicle   . GERD (gastroesophageal reflux disease)   . History of fractured vertebra   . Hypertension   . Hypothyroidism   . Seasonal allergies   . Thyroid disease   . Vertigo   . Vitamin A deficiency   . Wrist fracture, right     Patient Active Problem List   Diagnosis Date Noted  . Hyponatremia 12/13/2015  . Obstructive jaundice   . Bile duct stricture   . Pancreatitis 10/23/2015  . Abdominal pain 10/22/2015  . Pancreatic mass 10/22/2015  . Common bile duct (CBD) obstruction   . Abnormal LFTs   . Dehydration with hyponatremia 04/09/2015  . Acute respiratory failure with hypoxia ([place]) 04/09/2015  . Thrombocytopenia ([place]) 04/09/2015  . Hypokalemia 11/10/2014  . Hypertension 11/10/2014  . Hypothyroidism 11/10/2014  . Syncope due to orthostatic hypotension 11/10/2014  . Constipated   . UTI  (urinary tract infection) 06/04/2013  . Altered mental status 06/04/2013    Past Surgical History:  Procedure Laterality Date  . ABDOMINAL HYSTERECTOMY    . BACK SURGERY    . CHOLECYSTECTOMY    . ERCP N/A 10/22/2015   Procedure: ENDOSCOPIC RETROGRADE CHOLANGIOPANCREATOGRAPHY (ERCP);  Surgeon: Milus Banister, MD;  Location: [facility];  Service: Endoscopy;  Laterality: N/A;  . ERCP N/A 11/25/2015   Procedure: ENDOSCOPIC RETROGRADE CHOLANGIOPANCREATOGRAPHY (ERCP);  Surgeon: Milus Banister, MD;  Location: [facility];  Service: Endoscopy;  Laterality: N/A;  . EUS N/A 11/25/2015   Procedure: ESOPHAGEAL ENDOSCOPIC ULTRASOUND (EUS) RADIAL;  Surgeon: Milus Banister, MD;  Location: [facility] ENDOSCOPY;  Service: Endoscopy;  Laterality: N/A;  . FRACTURE SURGERY    . THYROIDECTOMY    . WRIST SURGERY      OB History    No data available       Home Medications    Prior to Admission medications   Medication Sig Start Date End Date Taking? Authorizing Provider  Alcaftadine (LASTACAFT) 0.25 % SOLN Place 1 drop into both eyes at bedtime.   Yes Historical Provider, MD  aluminum hydroxide-magnesium carbonate (GAVISCON) 95-358 MG/15ML SUSP Take 10 mLs by mouth 4 (four) times daily.    Yes Historical Provider, MD  bismuth subsalicylate (PEPTO BISMOL) 262 MG/15ML suspension Take 30 mLs by mouth every 4 (four) hours as needed  for diarrhea or loose stools.   Yes Historical Provider, MD  calcium-vitamin D (OSCAL WITH D) 500-200 MG-UNIT tablet Take 1 tablet by mouth daily.   Yes Historical Provider, MD  Carboxymethylcellul-Glycerin 0.5-0.9 % SOLN Apply to eye every 4 (four) hours. REFRESH  EYE DROP   Yes Historical Provider, MD  cetirizine (ZYRTEC) 10 MG tablet Take 10 mg by mouth at bedtime.    Yes Historical Provider, MD  chlorhexidine (PERIDEX) 0.12 % solution Swish and spit 15 mLs 2 (two) times daily.   Yes Historical Provider, MD  Dexlansoprazole 30 MG capsule Take 30 mg by mouth daily.   Yes Historical  Provider, MD  dextromethorphan (DELSYM) 30 MG/5ML liquid Take 10 mLs by mouth every 12 (twelve) hours as needed for cough.   Yes Historical Provider, MD  diclofenac sodium (VOLTAREN) 1 % GEL Apply topically every 6 (six) hours as needed (pain).    Yes Historical Provider, MD  fluticasone (FLONASE) 50 MCG/ACT nasal spray Place 2 sprays into both nostrils daily.   Yes Historical Provider, MD  hydrochlorothiazide (HYDRODIURIL) 25 MG tablet Take 12.5 mg by mouth daily.    Yes Historical Provider, MD  HYDROcodone-acetaminophen (NORCO/VICODIN) 5-325 MG tablet Take 1 tablet by mouth every morning.   Yes Historical Provider, MD  hydrocortisone cream 1 % Apply 1 application topically every 6 (six) hours as needed for itching.   Yes Historical Provider, MD  hydrOXYzine (VISTARIL) 25 MG capsule Take 25 mg by mouth 3 (three) times daily.    Yes Historical Provider, MD  Infant Care Products (JOHNSONS BABY SHAMPOO EX) Apply topically. Dilute a pea size amount with water; close right eye, wash and rinse. Repeat twice daily for 2 days prior to injection.   Yes Historical Provider, MD  levothyroxine (SYNTHROID, LEVOTHROID) 112 MCG tablet Take 112 mcg by mouth daily before breakfast.   Yes Historical Provider, MD  loperamide (IMODIUM) 2 MG capsule Take 2 mg by mouth daily as needed for diarrhea or loose stools. After each loose stool   Yes Historical Provider, MD  Multiple Vitamins-Minerals (PRESERVISION AREDS) CAPS Take 1 capsule by mouth 2 (two) times daily.   Yes Historical Provider, MD  ondansetron (ZOFRAN) 8 MG tablet Take 8 mg by mouth every 8 (eight) hours as needed for nausea or vomiting.   Yes Historical Provider, MD  tiZANidine (ZANAFLEX) 4 MG tablet Take 4 mg by mouth 2 (two) times daily.   Yes Historical Provider, MD  tobramycin (TOBREX) 0.3 % ophthalmic solution Place 1 drop into the right eye 4 (four) times daily as needed (the day prior, the day of, and the day after treatment.).    Yes Historical  Provider, MD  traMADol (ULTRAM) 50 MG tablet Take 25 mg by mouth 3 (three) times daily.    Yes Historical Provider, MD  traZODone (DESYREL) 100 MG tablet Take 200 mg by mouth at bedtime.    Yes Historical Provider, MD  albuterol (PROVENTIL HFA;VENTOLIN HFA) 108 (90 BASE) MCG/ACT inhaler Inhale 2 puffs into the lungs every 4 (four) hours as needed for wheezing or shortness of breath (coough). Patient not taking: Reported on 12/13/2015 01/29/15   Gareth Morgan, MD  antiseptic oral rinse (BIOTENE) LIQD Swish and spit 15 mLs 3 (three) times daily.     Historical Provider, MD  benzocaine-resorcinol (VAGISIL) 5-2 % vaginal cream Place 1 application vaginally as needed for itching.    Historical Provider, MD  trolamine salicylate (ASPERCREME) 10 % cream Apply 1 application topically every 8 (eight)  hours as needed for muscle pain.     Historical Provider, MD    Family History Family History  Problem Relation Age of Onset  . Hypertension Other     Social History Social History  Substance Use Topics  . Smoking status: Never Smoker  . Smokeless tobacco: Never Used  . Alcohol use No     Allergies   Codeine; Cortisone; and Other   Review of Systems Review of Systems  Constitutional: Positive for chills.  Gastrointestinal: Positive for abdominal pain (epigastric), nausea and vomiting.  Genitourinary: Positive for dysuria.  All other systems reviewed and are negative.    Physical Exam Updated Vital Signs BP 170/80 (BP Location: Left Arm)   Pulse 64   Temp 98.1 F (36.7 C) (Oral)   Resp 16   SpO2 95%   Physical Exam  Constitutional: She appears well-developed and well-nourished.  HENT:  Head: Normocephalic and atraumatic.  Eyes: Conjunctivae and EOM are normal.  Neck: Normal range of motion.  Cardiovascular: Normal rate and regular rhythm.   Pulmonary/Chest: No stridor. No respiratory distress. She has no wheezes.  Abdominal: She exhibits no distension. There is no  tenderness.  Neurological: She is alert.  Skin: Skin is dry.  Nursing note and vitals reviewed.    ED Treatments / Results  Labs (all labs ordered are listed, but only abnormal results are displayed) Labs Reviewed  CBC WITH DIFFERENTIAL/PLATELET - Abnormal; Notable for the following:       Result Value   MCHC 37.2 (*)    All other components within normal limits  BASIC METABOLIC PANEL - Abnormal; Notable for the following:    Sodium 119 (*)    Potassium 3.3 (*)    Chloride 81 (*)    Glucose, Bld 128 (*)    Calcium 8.5 (*)    All other components within normal limits  URINALYSIS, ROUTINE W REFLEX MICROSCOPIC (NOT AT [facility]) - Abnormal; Notable for the following:    Ketones, ur >80 (*)    All other components within normal limits  LIPASE, BLOOD  SODIUM, URINE, RANDOM  BASIC METABOLIC PANEL  BASIC METABOLIC PANEL  BASIC METABOLIC PANEL  TSH  CBC    EKG  EKG Interpretation  Date/Time:  Tuesday December 14 2015 00:18:43 EDT Ventricular Rate:  71 PR Interval:    QRS Duration: 99 QT Interval:  483 QTC Calculation: 525 R Axis:   20 Text Interpretation:  Sinus rhythm Low voltage, precordial leads Nonspecific T abnormalities, lateral leads Prolonged QT interval Confirmed by [facility] MD, Corene Cornea [phone]) on 12/14/2015 12:29:15 AM       Radiology Dg Abd Acute W/chest  Result Date: 12/13/2015 CLINICAL DATA:  Vomiting since 1400 hours EXAM: DG ABDOMEN ACUTE W/ 1V CHEST COMPARISON:  10/28/2015 chest radiograph, CT from 10/22/2015 FINDINGS: The heart is top-normal in size. Streaky atelectasis noted at each lung base. There is mild pulmonary vascular congestion with slightly low lung volumes bilaterally. Slight blunting of left costophrenic angle from small left pleural effusion is suspected. The patient is status post T11 and T12 kyphoplasty. Surgical clips are seen in the right upper quadrant. Supine and decubitus views of the abdomen demonstrate findings of bowel obstruction. Moderate  stool burden within large bowel. There is no free intraperitoneal air. Three rounded calcified densities in the right hemipelvis are in keeping with known phleboliths. IMPRESSION: Negative abdominal radiographs.  No acute cardiopulmonary disease. Electronically Signed   By: Ashley Royalty M.D.   On: 12/13/2015 23:02  Procedures Procedures (including critical care time)  CRITICAL CARE Performed by: Merrily Pew Total critical care time: 35 minutes Critical care time was exclusive of separately billable procedures and treating other patients. Critical care was necessary to treat or prevent imminent or life-threatening deterioration. Critical care was time spent personally by me on the following activities: development of treatment plan with patient and/or surrogate as well as nursing, discussions with consultants, evaluation of patient's response to treatment, examination of patient, obtaining history from patient or surrogate, ordering and performing treatments and interventions, ordering and review of laboratory studies, ordering and review of radiographic studies, pulse oximetry and re-evaluation of patient's condition.   Medications Ordered in ED Medications  dextrose 5 %-0.45 % sodium chloride infusion (not administered)  Carboxymethylcellul-Glycerin 0.5-0.9 % SOLN (not administered)  diclofenac sodium (VOLTAREN) 1 % transdermal gel 2 g (not administered)  tiZANidine (ZANAFLEX) tablet 4 mg (not administered)  loratadine (CLARITIN) tablet 10 mg (not administered)  pantoprazole (PROTONIX) EC tablet 40 mg (not administered)  dextromethorphan (DELSYM) 30 MG/5ML liquid 60 mg (not administered)  hydrOXYzine (VISTARIL) capsule 25 mg (not administered)  traMADol (ULTRAM) tablet 25 mg (not administered)  bismuth subsalicylate (PEPTO BISMOL) 262 MG/15ML suspension 30 mL (not administered)  calcium-vitamin D (OSCAL WITH D) 500-200 MG-UNIT per tablet 1 tablet (not administered)    benzocaine-resorcinol (VAGISIL) 5-2 % vaginal cream 1 application (not administered)  fluticasone (FLONASE) 50 MCG/ACT nasal spray 2 spray (not administered)  loperamide (IMODIUM) capsule 2 mg (not administered)  tobramycin (TOBREX) 0.3 % ophthalmic solution 1 drop (not administered)  ketotifen (ZADITOR) 0.025 % ophthalmic solution 1 drop (not administered)  antiseptic oral rinse (BIOTENE) solution 15 mL (not administered)  traZODone (DESYREL) tablet 200 mg (not administered)  trolamine salicylate (ASPERCREME) 10 % cream 1 application (not administered)  chlorhexidine (PERIDEX) 0.12 % solution 15 mL (not administered)  PRESERVISION AREDS CAPS 1 capsule (not administered)  levothyroxine (SYNTHROID, LEVOTHROID) tablet 112 mcg (not administered)  enoxaparin (LOVENOX) injection 40 mg (not administered)  ondansetron (ZOFRAN) tablet 4 mg (not administered)    Or  ondansetron (ZOFRAN) injection 4 mg (not administered)  sodium chloride flush (NS) 0.9 % injection 3 mL (not administered)  sodium chloride flush (NS) 0.9 % injection 3 mL (not administered)  0.9 %  sodium chloride infusion (not administered)  potassium chloride 10 mEq in 100 mL IVPB (not administered)  HYDROcodone-acetaminophen (NORCO/VICODIN) 5-325 MG per tablet 1 tablet (not administered)  simethicone (MYLICON) chewable tablet 80 mg (not administered)  famotidine (PEPCID) IVPB 20 mg premix (not administered)  sodium chloride 0.9 % bolus 1,000 mL (0 mLs Intravenous Stopped 12/14/15 0004)  ondansetron (ZOFRAN) injection 4 mg (4 mg Intravenous Given 12/13/15 2249)  sodium chloride 0.9 % bolus 1,000 mL (1,000 mLs Intravenous New Bag/Given 12/14/15 0000)  metoCLOPramide (REGLAN) injection 10 mg (10 mg Intravenous Given 12/14/15 0015)     Initial Impression / Assessment and Plan / ED Course  I have reviewed the triage vital signs and the nursing notes.  Pertinent labs & imaging results that were available during my care of the  patient were reviewed by me and considered in my medical decision making (see chart for details).  Clinical Course   Will eval for UTI/bowel obstruction/pancreatitis.  Daily, series negative. Still waiting urine will do and out cath. Sodium is 119 so will continue with sodium repletion admit her to the hospital. Also add on an EKG. ecg with prolonged qt. Will need to hold on most anti emetics.  Final Clinical  Impressions(s) / ED Diagnoses   Final diagnoses:  Nausea  Hyponatremia  Nausea and vomiting, intractability of vomiting not specified, unspecified vomiting type  Prolonged Q-T interval on ECG    New Prescriptions New Prescriptions   No medications on file     Merrily Pew, MD 12/14/15 0030

## 2015-12-13 NOTE — H&P (Addendum)
History and Physical    Erika Conley [id number] DOB: 1934-03-24 DOA: 12/13/2015  Referring MD/NP/PA: Dr. Dolly Rias PCP: Leonard Downing, MD  Patient coming from: [facility] retirement facility  Chief Complaint: Nausea and vomiting  HPI: Erika Conley is a 81 y.o. female with medical history significant of HTN, hypothyroidism, GERD, depression; who presents with nausea and vomiting. Patient notes that she did not feel well when she woke up and then around 1400 this afternoon started having nausea vomiting. She does not believe that she ate anything out of the ordinary and has not been around any sick contacts to her knowledge. Patient had anywhere from 4-5 episodes of nausea and vomiting. Patient was given oral antiemetics without relief of symptoms at the nursing facility. Associated symptoms include dysuria, epigastric/lower abdominal discomfort, abdominal distention, and hiccups. Denies any chest pain, shortness of breath, diarrhea, or blood in stool or urine. Patient notes that she had recently been evaluated for abdominal pain and pancreatic masson 10/5; which turned out to not be a pancreatic mass on further imaging studies. 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 Laboratory work revealed CBC relatively within normal limits, sodium 119, potassium 3.3, chloride 81, CO2 28, BUN 11, creatinine 0.46, glucose 128, lipase 23, and calcium 8.5. Urinalysis was negative. Acute abdominal series x-ray showed no acute abnormalities. Patient was given 1 L of normal saline IV fluids in the ED along with Reglan and Zofran. TRH called to admit.  Review of Systems: As per HPI otherwise 10 point review of systems negative.   Past Medical History:  Diagnosis Date  . Anxiety   .  Arthritis   . Depression   . Falls frequently   . Fibromyalgia   . Fracture, clavicle   . GERD (gastroesophageal reflux disease)   . History of fractured vertebra   . Hypertension   . Hypothyroidism   . Seasonal allergies   . Thyroid disease   . Vertigo   . Vitamin A deficiency   . Wrist fracture, right     Past Surgical History:  Procedure Laterality Date  . ABDOMINAL HYSTERECTOMY    . BACK SURGERY    . CHOLECYSTECTOMY    . ERCP N/A 10/22/2015   Procedure: ENDOSCOPIC RETROGRADE CHOLANGIOPANCREATOGRAPHY (ERCP);  Surgeon: Milus Banister, MD;  Location: [facility];  Service: Endoscopy;  Laterality: N/A;  . ERCP N/A 11/25/2015   Procedure: ENDOSCOPIC RETROGRADE CHOLANGIOPANCREATOGRAPHY (ERCP);  Surgeon: Milus Banister, MD;  Location: [facility];  Service: Endoscopy;  Laterality: N/A;  . EUS N/A 11/25/2015   Procedure: ESOPHAGEAL ENDOSCOPIC ULTRASOUND (EUS) RADIAL;  Surgeon: Milus Banister, MD;  Location: [facility];  Service: Endoscopy;  Laterality: N/A;  . FRACTURE SURGERY    . THYROIDECTOMY    . WRIST SURGERY       reports that she has never smoked. She has never used smokeless tobacco. She reports that she does not drink alcohol or use drugs.  Allergies  Allergen Reactions  . Codeine     Pass out, fall  . Cortisone     Pass out, fall   . Other     All narcotics cause patient to Pass out, fall     Family History  Problem Relation Age of Onset  . Hypertension Other     Prior to  Admission medications   Medication Sig Start Date End Date Taking? Authorizing Provider  Alcaftadine (LASTACAFT) 0.25 % SOLN Place 1 drop into both eyes at bedtime.   Yes Historical Provider, MD  aluminum hydroxide-magnesium carbonate (GAVISCON) 95-358 MG/15ML SUSP Take 10 mLs by mouth 4 (four) times daily.    Yes Historical Provider, MD  bismuth subsalicylate (PEPTO BISMOL) 262 MG/15ML suspension Take 30 mLs by mouth every 4 (four) hours as needed for diarrhea or loose stools.   Yes  Historical Provider, MD  calcium-vitamin D (OSCAL WITH D) 500-200 MG-UNIT tablet Take 1 tablet by mouth daily.   Yes Historical Provider, MD  Carboxymethylcellul-Glycerin 0.5-0.9 % SOLN Apply to eye every 4 (four) hours. REFRESH  EYE DROP   Yes Historical Provider, MD  cetirizine (ZYRTEC) 10 MG tablet Take 10 mg by mouth at bedtime.    Yes Historical Provider, MD  chlorhexidine (PERIDEX) 0.12 % solution Swish and spit 15 mLs 2 (two) times daily.   Yes Historical Provider, MD  Dexlansoprazole 30 MG capsule Take 30 mg by mouth daily.   Yes Historical Provider, MD  dextromethorphan (DELSYM) 30 MG/5ML liquid Take 10 mLs by mouth every 12 (twelve) hours as needed for cough.   Yes Historical Provider, MD  diclofenac sodium (VOLTAREN) 1 % GEL Apply topically every 6 (six) hours as needed (pain).    Yes Historical Provider, MD  fluticasone (FLONASE) 50 MCG/ACT nasal spray Place 2 sprays into both nostrils daily.   Yes Historical Provider, MD  hydrochlorothiazide (HYDRODIURIL) 25 MG tablet Take 12.5 mg by mouth daily.    Yes Historical Provider, MD  HYDROcodone-acetaminophen (NORCO/VICODIN) 5-325 MG tablet Take 1 tablet by mouth every morning.   Yes Historical Provider, MD  hydrocortisone cream 1 % Apply 1 application topically every 6 (six) hours as needed for itching.   Yes Historical Provider, MD  hydrOXYzine (VISTARIL) 25 MG capsule Take 25 mg by mouth 3 (three) times daily.    Yes Historical Provider, MD  Infant Care Products (JOHNSONS BABY SHAMPOO EX) Apply topically. Dilute a pea size amount with water; close right eye, wash and rinse. Repeat twice daily for 2 days prior to injection.   Yes Historical Provider, MD  levothyroxine (SYNTHROID, LEVOTHROID) 112 MCG tablet Take 112 mcg by mouth daily before breakfast.   Yes Historical Provider, MD  loperamide (IMODIUM) 2 MG capsule Take 2 mg by mouth daily as needed for diarrhea or loose stools. After each loose stool   Yes Historical Provider, MD  Multiple  Vitamins-Minerals (PRESERVISION AREDS) CAPS Take 1 capsule by mouth 2 (two) times daily.   Yes Historical Provider, MD  ondansetron (ZOFRAN) 8 MG tablet Take 8 mg by mouth every 8 (eight) hours as needed for nausea or vomiting.   Yes Historical Provider, MD  tiZANidine (ZANAFLEX) 4 MG tablet Take 4 mg by mouth 2 (two) times daily.   Yes Historical Provider, MD  tobramycin (TOBREX) 0.3 % ophthalmic solution Place 1 drop into the right eye 4 (four) times daily as needed (the day prior, the day of, and the day after treatment.).    Yes Historical Provider, MD  traMADol (ULTRAM) 50 MG tablet Take 25 mg by mouth 3 (three) times daily.    Yes Historical Provider, MD  traZODone (DESYREL) 100 MG tablet Take 200 mg by mouth at bedtime.    Yes Historical Provider, MD  albuterol (PROVENTIL HFA;VENTOLIN HFA) 108 (90 BASE) MCG/ACT inhaler Inhale 2 puffs into the lungs every 4 (four) hours as needed  for wheezing or shortness of breath (coough). Patient not taking: Reported on 12/13/2015 01/29/15   Gareth Morgan, MD  antiseptic oral rinse (BIOTENE) LIQD Swish and spit 15 mLs 3 (three) times daily.     Historical Provider, MD  benzocaine-resorcinol (VAGISIL) 5-2 % vaginal cream Place 1 application vaginally as needed for itching.    Historical Provider, MD  trolamine salicylate (ASPERCREME) 10 % cream Apply 1 application topically every 8 (eight) hours as needed for muscle pain.     Historical Provider, MD    Physical Exam:  Constitutional: Elderly female who feels be in mild discomfort, but nontoxic able to follow commands Vitals:   12/13/15 2138 12/13/15 2148  BP:  170/80  Pulse:  64  Resp:  16  Temp:  98.1 F (36.7 C)  TempSrc:  Oral  SpO2: 94% 95%   Eyes: PERRL, lids and conjunctivae normal ENMT: Mucous membranes are dry. Posterior pharynx clear of any exudate or lesions.   Neck: normal, supple, no masses, no thyromegaly Respiratory: clear to auscultation bilaterally, no wheezing, no crackles.  Normal respiratory effort. No accessory muscle use.  Cardiovascular: Regular rate and rhythm, no murmurs / rubs / gallops. No extremity edema. 2+ pedal pulses. No carotid bruits.  Abdomen: Epigastric and lower suprapubic abdominal tenderness noted. No masses palpated. No hepatosplenomegaly. Bowel sounds positive.  Musculoskeletal: no clubbing / cyanosis. No joint deformity upper and lower extremities. Good ROM, no contractures. Normal muscle tone.  Skin: no rashes, lesions, ulcers. No induration Neurologic: CN 2-12 grossly intact. Sensation intact, DTR normal. Strength 5/5 in all 4.  Psychiatric: Normal judgment and insight. Alert and oriented x 3. Normal mood.     Labs on Admission: I have personally reviewed following labs and imaging studies  CBC: No results for input(s): WBC, NEUTROABS, HGB, HCT, MCV, PLT in the last 168 hours. Basic Metabolic Panel:  Recent Labs Lab 12/13/15 2249  NA 119*  K 3.3*  CL 81*  CO2 28  GLUCOSE 128*  BUN 11  CREATININE 0.46  CALCIUM 8.5*   GFR: CrCl cannot be calculated (Unknown ideal weight.). Liver Function Tests: No results for input(s): AST, ALT, ALKPHOS, BILITOT, PROT, ALBUMIN in the last 168 hours.  Recent Labs Lab 12/13/15 2249  LIPASE 23   No results for input(s): AMMONIA in the last 168 hours. Coagulation Profile: No results for input(s): INR, PROTIME in the last 168 hours. Cardiac Enzymes: No results for input(s): CKTOTAL, CKMB, CKMBINDEX, TROPONINI in the last 168 hours. BNP (last 3 results) No results for input(s): PROBNP in the last 8760 hours. HbA1C: No results for input(s): HGBA1C in the last 72 hours. CBG: No results for input(s): GLUCAP in the last 168 hours. Lipid Profile: No results for input(s): CHOL, HDL, LDLCALC, TRIG, CHOLHDL, LDLDIRECT in the last 72 hours. Thyroid Function Tests: No results for input(s): TSH, T4TOTAL, FREET4, T3FREE, THYROIDAB in the last 72 hours. Anemia Panel: No results for input(s):  VITAMINB12, FOLATE, FERRITIN, TIBC, IRON, RETICCTPCT in the last 72 hours. Urine analysis:    Component Value Date/Time   COLORURINE YELLOW 10/28/2015 [place] 10/28/2015 1255   LABSPEC 1.010 10/28/2015 1255   PHURINE 6.5 10/28/2015 1255   GLUCOSEU NEGATIVE 10/28/2015 1255   HGBUR NEGATIVE 10/28/2015 1255   BILIRUBINUR NEGATIVE 10/28/2015 1255   KETONESUR NEGATIVE 10/28/2015 1255   PROTEINUR NEGATIVE 10/28/2015 1255   UROBILINOGEN 0.2 11/21/2014 0501   NITRITE NEGATIVE 10/28/2015 1255   LEUKOCYTESUR TRACE (A) 10/28/2015 1255   Sepsis Labs:  No results found for this or any previous visit (from the past 240 hour(s)).   Radiological Exams on Admission: Dg Abd Acute W/chest  Result Date: 12/13/2015 CLINICAL DATA:  Vomiting since 1400 hours EXAM: DG ABDOMEN ACUTE W/ 1V CHEST COMPARISON:  10/28/2015 chest radiograph, CT from 10/22/2015 FINDINGS: The heart is top-normal in size. Streaky atelectasis noted at each lung base. There is mild pulmonary vascular congestion with slightly low lung volumes bilaterally. Slight blunting of left costophrenic angle from small left pleural effusion is suspected. The patient is status post T11 and T12 kyphoplasty. Surgical clips are seen in the right upper quadrant. Supine and decubitus views of the abdomen demonstrate findings of bowel obstruction. Moderate stool burden within large bowel. There is no free intraperitoneal air. Three rounded calcified densities in the right hemipelvis are in keeping with known phleboliths. IMPRESSION: Negative abdominal radiographs.  No acute cardiopulmonary disease. Electronically Signed   By: Ashley Royalty M.D.   On: 12/13/2015 23:02   EKG: Sinus rhythm with prolonged PR interval similar to previous tracings. QTC 525  Assessment/Plan Hyponatremia: Acute on chronic. Sodium down to 119 on admission. Previous sodium level seem to be as low as 120 - Admit to a telemetry bed - Goal to raise sodium no more than 10  mEq per 24 hours - Follow-up urine sodium and potassium - Continued D5 1/2 normal saline at 75 ml/hr just rate as needed,  - BMPs every 8 hours 3   Nausea, vomiting and abdominal pain: Acute suspected could be secondary to hyponatremia vs. gastroenteritis vs. other. Urinalysis showed no signs of acute infection - Zofran prn N/V  Hypokalemia: Acute. Potassium 3.3 on admission. - IV potassium chloride 40 mEq - Continue to monitor and replace as needed   Prolonged QTC: QTC 525 - held zanaflex, hydroxyzine, and Imodium - correcting electrolyte abnormalities - Recheck EKG in am  Essential hypertension - Hold HCTZ  Hypothyroidism - Check TSH - Continue levothyroxine  Depression - Continue trazodone   GERD - pepcid IV x 1 dose   DVT prophylaxis: Lovenox Code Status: DO NOT RESUSCITATE Family Communication: No family present at bedside Disposition Plan: Likely discharge back to skilled nursing facility once stable Consults called: None Admission status: Observation telemetry  Norval Morton MD [facility] Pager [phone]  If 7PM-7AM, please contact night-coverage [email or web address] Password [email or web address]  12/13/2015, 11:54 PM

## 2015-12-13 NOTE — ED Notes (Signed)
Bed: [id number] Expected date:  Expected time:  Means of arrival:  Comments: EMS 81 yo female from [place] and vomiting since 1400-burning on urination

## 2015-12-14 DIAGNOSIS — R112 Nausea with vomiting, unspecified: Secondary | ICD-10-CM | POA: Diagnosis present

## 2015-12-14 DIAGNOSIS — Z7951 Long term (current) use of inhaled steroids: Secondary | ICD-10-CM | POA: Diagnosis not present

## 2015-12-14 DIAGNOSIS — I10 Essential (primary) hypertension: Secondary | ICD-10-CM | POA: Diagnosis present

## 2015-12-14 DIAGNOSIS — K529 Noninfective gastroenteritis and colitis, unspecified: Secondary | ICD-10-CM | POA: Diagnosis present

## 2015-12-14 DIAGNOSIS — Z66 Do not resuscitate: Secondary | ICD-10-CM | POA: Diagnosis present

## 2015-12-14 DIAGNOSIS — R296 Repeated falls: Secondary | ICD-10-CM | POA: Diagnosis present

## 2015-12-14 DIAGNOSIS — E860 Dehydration: Secondary | ICD-10-CM | POA: Diagnosis present

## 2015-12-14 DIAGNOSIS — M1990 Unspecified osteoarthritis, unspecified site: Secondary | ICD-10-CM | POA: Diagnosis present

## 2015-12-14 DIAGNOSIS — F329 Major depressive disorder, single episode, unspecified: Secondary | ICD-10-CM | POA: Diagnosis present

## 2015-12-14 DIAGNOSIS — Z9689 Presence of other specified functional implants: Secondary | ICD-10-CM | POA: Diagnosis present

## 2015-12-14 DIAGNOSIS — T502X5A Adverse effect of carbonic-anhydrase inhibitors, benzothiadiazides and other diuretics, initial encounter: Secondary | ICD-10-CM | POA: Diagnosis present

## 2015-12-14 DIAGNOSIS — Z79891 Long term (current) use of opiate analgesic: Secondary | ICD-10-CM | POA: Diagnosis not present

## 2015-12-14 DIAGNOSIS — Z23 Encounter for immunization: Secondary | ICD-10-CM | POA: Diagnosis not present

## 2015-12-14 DIAGNOSIS — J302 Other seasonal allergic rhinitis: Secondary | ICD-10-CM | POA: Diagnosis present

## 2015-12-14 DIAGNOSIS — R300 Dysuria: Secondary | ICD-10-CM | POA: Diagnosis present

## 2015-12-14 DIAGNOSIS — E871 Hypo-osmolality and hyponatremia: Secondary | ICD-10-CM | POA: Diagnosis not present

## 2015-12-14 DIAGNOSIS — E222 Syndrome of inappropriate secretion of antidiuretic hormone: Secondary | ICD-10-CM | POA: Diagnosis present

## 2015-12-14 DIAGNOSIS — R109 Unspecified abdominal pain: Secondary | ICD-10-CM | POA: Diagnosis not present

## 2015-12-14 DIAGNOSIS — K219 Gastro-esophageal reflux disease without esophagitis: Secondary | ICD-10-CM | POA: Diagnosis present

## 2015-12-14 DIAGNOSIS — E876 Hypokalemia: Secondary | ICD-10-CM | POA: Diagnosis present

## 2015-12-14 DIAGNOSIS — Z888 Allergy status to other drugs, medicaments and biological substances status: Secondary | ICD-10-CM | POA: Diagnosis not present

## 2015-12-14 DIAGNOSIS — Z885 Allergy status to narcotic agent status: Secondary | ICD-10-CM | POA: Diagnosis not present

## 2015-12-14 DIAGNOSIS — E039 Hypothyroidism, unspecified: Secondary | ICD-10-CM | POA: Diagnosis present

## 2015-12-14 DIAGNOSIS — M797 Fibromyalgia: Secondary | ICD-10-CM | POA: Diagnosis present

## 2015-12-14 LAB — URINALYSIS, ROUTINE W REFLEX MICROSCOPIC
Bilirubin Urine: NEGATIVE
GLUCOSE, UA: NEGATIVE mg/dL
HGB URINE DIPSTICK: NEGATIVE
Ketones, ur: >80 mg/dL — AB
Leukocytes, UA: NEGATIVE
Nitrite: NEGATIVE
PH: 7 (ref 5.0–8.0)
Protein, ur: NEGATIVE mg/dL
SPECIFIC GRAVITY, URINE: 1.016 (ref 1.005–1.030)

## 2015-12-14 LAB — COMPREHENSIVE METABOLIC PANEL
ALK PHOS: 79 U/L (ref 38–126)
ALT: 8 U/L — AB (ref 14–54)
AST: 14 U/L — ABNORMAL LOW (ref 15–41)
Albumin: 3.4 g/dL — ABNORMAL LOW (ref 3.5–5.0)
Anion gap: 9 (ref 5–15)
BUN: 8 mg/dL (ref 6–20)
CALCIUM: 8.4 mg/dL — AB (ref 8.9–10.3)
CO2: 28 mmol/L (ref 22–32)
CREATININE: 0.53 mg/dL (ref 0.44–1.00)
Chloride: 88 mmol/L — ABNORMAL LOW (ref 101–111)
Glucose, Bld: 121 mg/dL — ABNORMAL HIGH (ref 65–99)
Potassium: 3.4 mmol/L — ABNORMAL LOW (ref 3.5–5.1)
Sodium: 125 mmol/L — ABNORMAL LOW (ref 135–145)
Total Bilirubin: 1.1 mg/dL (ref 0.3–1.2)
Total Protein: 5.6 g/dL — ABNORMAL LOW (ref 6.5–8.1)

## 2015-12-14 LAB — BASIC METABOLIC PANEL
Anion gap: 9 (ref 5–15)
Anion gap: 9 (ref 5–15)
Anion gap: 6 (ref 5–15)
BUN: 6 mg/dL (ref 6–20)
BUN: 6 mg/dL (ref 6–20)
BUN: 7 mg/dL (ref 6–20)
CHLORIDE: 90 mmol/L — AB (ref 101–111)
CHLORIDE: 96 mmol/L — AB (ref 101–111)
CHLORIDE: 101 mmol/L (ref 101–111)
CO2: 26 mmol/L (ref 22–32)
CO2: 27 mmol/L (ref 22–32)
CO2: 28 mmol/L (ref 22–32)
CREATININE: 0.58 mg/dL (ref 0.44–1.00)
CREATININE: 0.7 mg/dL (ref 0.44–1.00)
CREATININE: 0.67 mg/dL (ref 0.44–1.00)
Calcium: 8.7 mg/dL — ABNORMAL LOW (ref 8.9–10.3)
Calcium: 8.8 mg/dL — ABNORMAL LOW (ref 8.9–10.3)
Calcium: 8.5 mg/dL — ABNORMAL LOW (ref 8.9–10.3)
GFR calc Af Amer: >60 mL/min (ref >60)
GFR calc Af Amer: >60 mL/min (ref >60)
GFR calc Af Amer: >60 mL/min (ref >60)
GFR calc non Af Amer: >60 mL/min (ref >60)
GFR calc non Af Amer: >60 mL/min (ref >60)
GFR calc non Af Amer: >60 mL/min (ref >60)
GLUCOSE: 150 mg/dL — AB (ref 65–99)
GLUCOSE: 133 mg/dL — AB (ref 65–99)
Glucose, Bld: 148 mg/dL — ABNORMAL HIGH (ref 65–99)
Potassium: 3.8 mmol/L (ref 3.5–5.1)
Potassium: 3.2 mmol/L — ABNORMAL LOW (ref 3.5–5.1)
Potassium: 3.4 mmol/L — ABNORMAL LOW (ref 3.5–5.1)
SODIUM: 125 mmol/L — AB (ref 135–145)
SODIUM: 132 mmol/L — AB (ref 135–145)
SODIUM: 135 mmol/L (ref 135–145)

## 2015-12-14 LAB — CBC
HCT: 39.6 % (ref 36.0–46.0)
HEMOGLOBIN: 14.6 g/dL (ref 12.0–15.0)
MCH: 28.7 pg (ref 26.0–34.0)
MCHC: 36.9 g/dL — ABNORMAL HIGH (ref 30.0–36.0)
MCV: 77.8 fL — AB (ref 78.0–100.0)
PLATELETS: 211 10*3/uL (ref 150–400)
RBC: 5.09 MIL/uL (ref 3.87–5.11)
RDW: 12.7 % (ref 11.5–15.5)
WBC: 5.9 10*3/uL (ref 4.0–10.5)

## 2015-12-14 LAB — TSH: TSH: 2.584 u[IU]/mL (ref 0.350–4.500)

## 2015-12-14 LAB — TROPONIN I: Troponin I: <0.03 ng/mL (ref <0.03)

## 2015-12-14 LAB — MRSA PCR SCREENING: MRSA BY PCR: NEGATIVE

## 2015-12-14 LAB — SODIUM, URINE, RANDOM: Sodium, Ur: 131 mmol/L

## 2015-12-14 MED ORDER — ENOXAPARIN SODIUM 40 MG/0.4ML ~~LOC~~ SOLN
40.0000 mg | SUBCUTANEOUS | Status: DC
  Administered 2015-12-14 – 2015-12-16 (×3): 40 mg via SUBCUTANEOUS
  Filled 2015-12-14 (×2): qty 0.4

## 2015-12-14 MED ORDER — ONDANSETRON HCL 4 MG/2ML IJ SOLN
4.0000 mg | Freq: Four times a day (QID) | INTRAMUSCULAR | Status: DC | PRN
  Administered 2015-12-14 (×2): 4 mg via INTRAVENOUS
  Filled 2015-12-14 (×2): qty 2

## 2015-12-14 MED ORDER — POLYVINYL ALCOHOL 1.4 % OP SOLN
1.0000 [drp] | OPHTHALMIC | Status: DC
  Administered 2015-12-14 – 2015-12-16 (×10): 1 [drp] via OPHTHALMIC
  Filled 2015-12-14: qty 15

## 2015-12-14 MED ORDER — FLUTICASONE PROPIONATE 50 MCG/ACT NA SUSP
2.0000 | Freq: Every day | NASAL | Status: DC
  Administered 2015-12-14 – 2015-12-16 (×3): 2 via NASAL
  Filled 2015-12-14: qty 16

## 2015-12-14 MED ORDER — PANTOPRAZOLE SODIUM 40 MG PO TBEC: 40.0000 mg | DELAYED_RELEASE_TABLET | Freq: Every day | ORAL | Status: DC

## 2015-12-14 MED ORDER — CHLORHEXIDINE GLUCONATE 0.12 % MT SOLN
15.0000 mL | Freq: Two times a day (BID) | OROMUCOSAL | Status: DC
  Administered 2015-12-14 – 2015-12-16 (×5): 15 mL via OROMUCOSAL
  Filled 2015-12-14 (×5): qty 15

## 2015-12-14 MED ORDER — SODIUM CHLORIDE 0.9 % IV SOLN: 250.0000 mL | INTRAVENOUS | Status: DC | PRN

## 2015-12-14 MED ORDER — BISMUTH SUBSALICYLATE 262 MG/15ML PO SUSP
30.0000 mL | ORAL | Status: DC | PRN
  Filled 2015-12-14: qty 118

## 2015-12-14 MED ORDER — DEXTROSE-NACL 5-0.45 % IV SOLN
INTRAVENOUS | Status: DC
  Administered 2015-12-14 – 2015-12-15 (×3): via INTRAVENOUS

## 2015-12-14 MED ORDER — BIOTENE DRY MOUTH MT LIQD
15.0000 mL | Freq: Three times a day (TID) | OROMUCOSAL | Status: DC
  Administered 2015-12-14 – 2015-12-16 (×7): 15 mL via OROMUCOSAL

## 2015-12-14 MED ORDER — METOCLOPRAMIDE HCL 5 MG/ML IJ SOLN
10.0000 mg | Freq: Once | INTRAMUSCULAR | Status: AC
  Administered 2015-12-14: 10 mg via INTRAVENOUS
  Filled 2015-12-14: qty 2

## 2015-12-14 MED ORDER — TRAMADOL HCL 50 MG PO TABS
25.0000 mg | ORAL_TABLET | Freq: Three times a day (TID) | ORAL | Status: DC | PRN
  Administered 2015-12-14: 25 mg via ORAL
  Filled 2015-12-14 (×2): qty 1

## 2015-12-14 MED ORDER — TRAZODONE HCL 100 MG PO TABS
200.0000 mg | ORAL_TABLET | Freq: Every day | ORAL | Status: DC
  Administered 2015-12-14 – 2015-12-15 (×2): 200 mg via ORAL
  Filled 2015-12-14 (×4): qty 2

## 2015-12-14 MED ORDER — LORATADINE 10 MG PO TABS
10.0000 mg | ORAL_TABLET | Freq: Every day | ORAL | Status: DC
  Administered 2015-12-14 – 2015-12-16 (×3): 10 mg via ORAL
  Filled 2015-12-14 (×3): qty 1

## 2015-12-14 MED ORDER — FAMOTIDINE IN NACL 20-0.9 MG/50ML-% IV SOLN
20.0000 mg | Freq: Once | INTRAVENOUS | Status: AC
Start: 2015-12-14 — End: 2015-12-14
  Administered 2015-12-14: 20 mg via INTRAVENOUS
  Filled 2015-12-14: qty 50

## 2015-12-14 MED ORDER — PROSIGHT PO TABS
1.0000 | ORAL_TABLET | Freq: Two times a day (BID) | ORAL | Status: DC
  Administered 2015-12-14 – 2015-12-16 (×5): 1 via ORAL
  Filled 2015-12-14 (×5): qty 1

## 2015-12-14 MED ORDER — HYDROXYZINE PAMOATE 25 MG PO CAPS: 25.0000 mg | ORAL_CAPSULE | Freq: Three times a day (TID) | ORAL | Status: DC

## 2015-12-14 MED ORDER — DEXTROMETHORPHAN POLISTIREX ER 30 MG/5ML PO SUER
10.0000 mL | Freq: Two times a day (BID) | ORAL | Status: DC | PRN
  Filled 2015-12-14: qty 10

## 2015-12-14 MED ORDER — HYDROCODONE-ACETAMINOPHEN 5-325 MG PO TABS
1.0000 | ORAL_TABLET | Freq: Every day | ORAL | Status: DC | PRN
  Administered 2015-12-14: 1 via ORAL
  Filled 2015-12-14 (×2): qty 1

## 2015-12-14 MED ORDER — CALCIUM CARBONATE-VITAMIN D 500-200 MG-UNIT PO TABS
1.0000 | ORAL_TABLET | Freq: Every day | ORAL | Status: DC
  Administered 2015-12-14 – 2015-12-16 (×3): 1 via ORAL
  Filled 2015-12-14 (×3): qty 1

## 2015-12-14 MED ORDER — ONDANSETRON HCL 4 MG PO TABS
4.0000 mg | ORAL_TABLET | Freq: Four times a day (QID) | ORAL | Status: DC | PRN
  Administered 2015-12-15 (×2): 4 mg via ORAL
  Filled 2015-12-14 (×2): qty 1

## 2015-12-14 MED ORDER — HYDROCODONE-ACETAMINOPHEN 5-325 MG PO TABS
1.0000 | ORAL_TABLET | Freq: Four times a day (QID) | ORAL | Status: DC | PRN
  Administered 2015-12-15 – 2015-12-16 (×4): 1 via ORAL
  Filled 2015-12-14 (×4): qty 1

## 2015-12-14 MED ORDER — HYDROCODONE-ACETAMINOPHEN 5-325 MG PO TABS: 1.0000 | ORAL_TABLET | ORAL | Status: DC

## 2015-12-14 MED ORDER — DICLOFENAC SODIUM 1 % TD GEL
2.0000 g | Freq: Four times a day (QID) | TRANSDERMAL | Status: DC | PRN
  Filled 2015-12-14: qty 100

## 2015-12-14 MED ORDER — LEVOTHYROXINE SODIUM 112 MCG PO TABS
112.0000 ug | ORAL_TABLET | Freq: Every day | ORAL | Status: DC
  Administered 2015-12-14 – 2015-12-16 (×3): 112 ug via ORAL
  Filled 2015-12-14 (×3): qty 1

## 2015-12-14 MED ORDER — TOBRAMYCIN 0.3 % OP SOLN
1.0000 [drp] | Freq: Four times a day (QID) | OPHTHALMIC | Status: DC | PRN
  Filled 2015-12-14: qty 5

## 2015-12-14 MED ORDER — SODIUM CHLORIDE 0.9% FLUSH: 3.0000 mL | INTRAVENOUS | Status: DC | PRN

## 2015-12-14 MED ORDER — KETOTIFEN FUMARATE 0.025 % OP SOLN
1.0000 [drp] | Freq: Every day | OPHTHALMIC | Status: DC
  Administered 2015-12-14 – 2015-12-15 (×2): 1 [drp] via OPHTHALMIC
  Filled 2015-12-14: qty 5

## 2015-12-14 MED ORDER — BENZOCAINE-RESORCINOL 5-2 % VA CREA: 1.0000 "application " | TOPICAL_CREAM | VAGINAL | Status: DC | PRN

## 2015-12-14 MED ORDER — LOPERAMIDE HCL 2 MG PO CAPS: 2.0000 mg | ORAL_CAPSULE | Freq: Every day | ORAL | Status: DC | PRN

## 2015-12-14 MED ORDER — POTASSIUM CHLORIDE 10 MEQ/100ML IV SOLN
10.0000 meq | INTRAVENOUS | Status: AC
  Administered 2015-12-14: 10 meq via INTRAVENOUS

## 2015-12-14 MED ORDER — POTASSIUM CHLORIDE 10 MEQ/100ML IV SOLN
INTRAVENOUS | Status: AC
  Administered 2015-12-14: 10 meq
  Filled 2015-12-14: qty 200

## 2015-12-14 MED ORDER — TROLAMINE SALICYLATE 10 % EX CREA
1.0000 "application " | TOPICAL_CREAM | Freq: Three times a day (TID) | CUTANEOUS | Status: DC | PRN
  Filled 2015-12-14: qty 85

## 2015-12-14 MED ORDER — SODIUM CHLORIDE 0.9% FLUSH
3.0000 mL | Freq: Two times a day (BID) | INTRAVENOUS | Status: DC
  Administered 2015-12-14 – 2015-12-16 (×4): 3 mL via INTRAVENOUS

## 2015-12-14 MED ORDER — POTASSIUM CHLORIDE 10 MEQ/100ML IV SOLN
10.0000 meq | INTRAVENOUS | Status: AC
  Administered 2015-12-14 (×2): 10 meq via INTRAVENOUS
  Filled 2015-12-14 (×2): qty 100

## 2015-12-14 MED ORDER — ACETAMINOPHEN 325 MG PO TABS
650.0000 mg | ORAL_TABLET | Freq: Four times a day (QID) | ORAL | Status: DC | PRN
  Administered 2015-12-14: 650 mg via ORAL
  Filled 2015-12-14: qty 2

## 2015-12-14 MED ORDER — TIZANIDINE HCL 4 MG PO TABS: 4.0000 mg | ORAL_TABLET | Freq: Two times a day (BID) | ORAL | Status: DC

## 2015-12-14 MED ORDER — SIMETHICONE 80 MG PO CHEW
80.0000 mg | CHEWABLE_TABLET | Freq: Four times a day (QID) | ORAL | Status: DC | PRN
  Administered 2015-12-15: 80 mg via ORAL
  Filled 2015-12-14: qty 1

## 2015-12-14 MED ORDER — INFLUENZA VAC SPLIT QUAD 0.5 ML IM SUSY
0.5000 mL | PREFILLED_SYRINGE | INTRAMUSCULAR | Status: AC
  Administered 2015-12-16: 0.5 mL via INTRAMUSCULAR
  Filled 2015-12-14 (×2): qty 0.5

## 2015-12-14 NOTE — NC FL2 (Addendum)
[place] LEVEL OF CARE SCREENING TOOL     IDENTIFICATION  Patient Name: Erika Conley Birthdate: 03-26-1934 Sex: female Admission Date (Current Location): 12/13/2015  [facility] [place] Number:  Herbalist and Address:  [facility],  [address] [place], [place]      Provider Number: [id number]  Attending Physician Name and Address:  Janece Canterbury, MD  Relative Name and Phone Number:  Alliene Ashworth [phone]    Current Level of Care: Hospital Recommended Level of Care: [place] Prior Approval Number:    Date Approved/Denied:   PASRR Number: [id number]  Discharge Plan:  (ALF)    Current Diagnoses: Patient Active Problem List   Diagnosis Date Noted  . Nausea and vomiting 12/14/2015  . Hyponatremia 12/13/2015  . Obstructive jaundice   . Bile duct stricture   . Pancreatitis 10/23/2015  . Abdominal pain 10/22/2015  . Pancreatic mass 10/22/2015  . Common bile duct (CBD) obstruction   . Abnormal LFTs   . Dehydration with hyponatremia 04/09/2015  . Acute respiratory failure with hypoxia ([place]) 04/09/2015  . Thrombocytopenia ([place]) 04/09/2015  . Hypokalemia 11/10/2014  . Hypertension 11/10/2014  . Hypothyroidism 11/10/2014  . Syncope due to orthostatic hypotension 11/10/2014  . Constipated   . UTI (urinary tract infection) 06/04/2013  . Altered mental status 06/04/2013    Orientation RESPIRATION BLADDER Height & Weight     Self, Time, Situation, Place  Normal Continent Weight: 143 lb 15.4 oz (65.3 kg) Height:  5' (152.4 cm)  BEHAVIORAL SYMPTOMS/MOOD NEUROLOGICAL BOWEL NUTRITION STATUS      Continent Diet (Heart)  AMBULATORY STATUS COMMUNICATION OF NEEDS Skin   Limited Assist Verbally Normal                       Personal Care Assistance Level of Assistance  Bathing, Feeding, Dressing Bathing Assistance: Limited assistance Feeding assistance: Independent Dressing Assistance: Limited  assistance     Functional Limitations Info  Speech, Hearing, Sight Sight Info: Adequate Hearing Info: Adequate Speech Info: Adequate    SPECIAL CARE FACTORS FREQUENCY                       Contractures      Additional Factors Info  Code Status, Allergies Code Status Info: DNR Allergies Info: Codeine, Cortisone            Discharge Medications: aluminum hydroxide-magnesium carbonate 95-358 MG/15ML Susp Commonly known as:  GAVISCON Take 10 mLs by mouth 4 (four) times daily.  antiseptic oral rinse Liqd Swish and spit 15 mLs 3 (three) times daily.  benzocaine-resorcinol 5-2 % vaginal cream Commonly known as:  VAGISIL Place 1 application vaginally as needed for itching.  bismuth subsalicylate [id number] suspension Commonly known as:  PEPTO BISMOL Take 30 mLs by mouth every 4 (four) hours as needed for diarrhea or loose stools.  calcium-vitamin D 500-200 MG-UNIT tablet Commonly known as:  OSCAL WITH D Take 1 tablet by mouth daily.  Carboxymethylcellul-Glycerin 0.5-0.9 % Soln Apply to eye every 4 (four) hours. REFRESH  EYE DROP  cetirizine 10 MG tablet Commonly known as:  ZYRTEC Take 10 mg by mouth at bedtime.  chlorhexidine 0.12 % solution Commonly known as:  PERIDEX Swish and spit 15 mLs 2 (two) times daily.  DELSYM 30 MG/5ML liquid Generic drug:  dextromethorphan Take 10 mLs by mouth every 12 (twelve) hours as needed for cough.  Dexlansoprazole 30 MG capsule Take 30  mg by mouth daily.  diclofenac sodium 1 % Gel Commonly known as:  VOLTAREN Apply topically every 6 (six) hours as needed (pain).  fluticasone 50 MCG/ACT nasal spray Commonly known as:  FLONASE Place 2 sprays into both nostrils daily.  hydrocortisone cream 1 % Apply 1 application topically every 6 (six) hours as needed for itching.  hydrOXYzine 25 MG capsule Commonly known as:  VISTARIL Take 25 mg by mouth 3 (three) times daily.  JOHNSONS BABY SHAMPOO EX Apply topically. Dilute a pea  size amount with water; close right eye, wash and rinse. Repeat twice daily for 2 days prior to injection.  LASTACAFT 0.25 % Soln Generic drug:  Alcaftadine Place 1 drop into both eyes at bedtime.  levothyroxine 112 MCG tablet Commonly known as:  SYNTHROID, LEVOTHROID Take 112 mcg by mouth daily before breakfast.  loperamide 2 MG capsule Commonly known as:  IMODIUM Take 2 mg by mouth daily as needed for diarrhea or loose stools. After each loose stool  ondansetron 8 MG tablet Commonly known as:  ZOFRAN Take 8 mg by mouth every 8 (eight) hours as needed for nausea or vomiting.  PRESERVISION AREDS Caps Take 1 capsule by mouth 2 (two) times daily.  tiZANidine 4 MG tablet Commonly known as:  ZANAFLEX Take 4 mg by mouth 2 (two) times daily.  tobramycin 0.3 % ophthalmic solution Commonly known as:  TOBREX Place 1 drop into the right eye 4 (four) times daily as needed (the day prior, the day of, and the day after treatment.).  traMADol 50 MG tablet Commonly known as:  ULTRAM Take 0.5 tablets (25 mg total) by mouth 3 (three) times daily as needed for moderate pain. What changed:  when to take this  reasons to take this  traZODone 100 MG tablet Commonly known as:  DESYREL Take 200 mg by mouth at bedtime.  trolamine salicylate 10 % cream Commonly known as:  ASPERCREME Apply 1 application topically every 8 (eight) hours as needed for muscle pain.     Relevant Imaging Results:  Relevant Lab Results:   Additional Information SSN [id number]  Standley Brooking, LCSW

## 2015-12-14 NOTE — Progress Notes (Signed)
CSW received consult that patient was admitted from [facility] (ph#: [phone]). Patient was recently admitted (see CSW assessment 10/23/15). CSW met with patient who states that she plans to return to ALF at discharge. CSW confirmed with Margreta Journey at ALF that they would be able to take patient back, pending clinical review. FL2 completed, will add medications once discharge summary is complete.    Raynaldo Opitz, [facility] Clinical Social Worker cell #: [phone]

## 2015-12-14 NOTE — Evaluation (Signed)
Physical Therapy Evaluation Patient Details Name: Erika Conley MRN: [id number] DOB: 12/12/1934 Today's Date: 12/14/2015   History of Present Illness  81 yo female admitted with hyponatremia, N/V. hx of vertigo, fibromyalgia, falls, HTN, arthritis, anxiety  Clinical Impression  On eval, pt was Min guard assist for mobility. She walked ~75 feet with a rollator. Recommend daily ambulation with nursing. Will follow and progress activity as tolerated.     Follow Up Recommendations Home health PT (at ALF)    Equipment Recommendations  None recommended by PT    Recommendations for Other Services       Precautions / Restrictions Precautions Precautions: Fall Restrictions Weight Bearing Restrictions: No      Mobility  Bed Mobility               General bed mobility comments: on BSC with NT  Transfers Overall transfer level: Needs assistance Equipment used: 4-wheeled walker Transfers: Sit to/from [employer] Transfers Sit to Stand: Min guard Stand pivot transfers: Min guard       General transfer comment: close guard for safety  Ambulation/Gait Ambulation/Gait assistance: Min guard Ambulation Distance (Feet): 75 Feet Assistive device: 4-wheeled walker Gait Pattern/deviations: Step-through pattern     General Gait Details: close guard for safety  Stairs            Wheelchair Mobility    Modified Rankin (Stroke Patients Only)       Balance Overall balance assessment: History of Falls;Needs assistance           Standing balance-Leahy Scale: Poor Standing balance comment: requires RW                             Pertinent Vitals/Pain Pain Assessment: No/denies pain    Home Living Family/patient expects to be discharged to:: Assisted living               Home Equipment: Walker - 4 wheels      Prior Function Level of Independence: Independent with assistive device(s)               Hand Dominance         Extremity/Trunk Assessment   Upper Extremity Assessment: Overall WFL for tasks assessed           Lower Extremity Assessment: Generalized weakness      Cervical / Trunk Assessment: Normal  Communication   Communication: No difficulties  Cognition Arousal/Alertness: Awake/alert Behavior During Therapy: WFL for tasks assessed/performed Overall Cognitive Status: Within Functional Limits for tasks assessed                      General Comments      Exercises     Assessment/Plan    PT Assessment Patient needs continued PT services  PT Problem List Decreased strength;Decreased mobility;Decreased activity tolerance;Decreased balance          PT Treatment Interventions DME instruction;Therapeutic activities;Therapeutic exercise;Gait training;Patient/family education;Balance training;Functional mobility training    PT Goals (Current goals can be found in the Care Plan section)  Acute Rehab PT Goals Patient Stated Goal: to get stronger PT Goal Formulation: With patient Time For Goal Achievement: 12/28/15 Potential to Achieve Goals: Good    Frequency Min 3X/week   Barriers to discharge        Co-evaluation               End of Session Equipment Utilized During Treatment: Gait belt  Activity Tolerance: Patient tolerated treatment well Patient left: in chair;with call bell/phone within reach;with chair alarm set      Functional Assessment Tool Used: clinical judgement Functional Limitation: Mobility: Walking and moving around Mobility: Walking and Moving Around Current Status [id number]): At least 1 percent but less than 20 percent impaired, limited or restricted Mobility: Walking and Moving Around Goal Status [phone]): At least 1 percent but less than 20 percent impaired, limited or restricted    Time: 1517-1535 PT Time Calculation (min) (ACUTE ONLY): 18 min   Charges:   PT Evaluation $PT Eval Low Complexity: 1 Procedure     PT G Codes:   PT  G-Codes **NOT FOR INPATIENT CLASS** Functional Assessment Tool Used: clinical judgement Functional Limitation: Mobility: Walking and moving around Mobility: Walking and Moving Around Current Status [id number]): At least 1 percent but less than 20 percent impaired, limited or restricted Mobility: Walking and Moving Around Goal Status [phone]): At least 1 percent but less than 20 percent impaired, limited or restricted    Weston Anna, MPT Pager: [phone]

## 2015-12-14 NOTE — Care Management Obs Status (Signed)
[place] NOTIFICATION   Patient Details  Name: Erika Conley MRN: [id number] Date of Birth: [birth year]   Medicare Observation Status Notification Given:  Yes    MahabirJuliann Pulse, RN 12/14/2015, 12:45 PM

## 2015-12-14 NOTE — Care Management Note (Signed)
Case Management Note  Patient Details  Name: Erika Conley MRN: [id number] Date of Birth: Jul 11, 1934  Subjective/Objective:  81 y/o f admitted w/hyponatremnia. From [facility] [place].Active w/[facility]. PT cons-await recc.Hx: DNR.CSW following.                Action/Plan:d/c plan return ALF.   Expected Discharge Date:                  Expected Discharge Plan:  Assisted Living / Rest Home  In-House Referral:  Clinical Social Work  Discharge planning Services  CM Consult  Post Acute Care Choice:  Home Health (Active w/[facility]-HHPT/HHOT) Choice offered to:     DME Arranged:    DME Agency:     HH Arranged:    HH Agency:     Status of Service:  In process, will continue to follow  If discussed at Long Length of Stay Meetings, dates discussed:    Additional Comments:  Dessa Phi, RN 12/14/2015, 11:56 AM

## 2015-12-14 NOTE — Progress Notes (Addendum)
PROGRESS NOTE  Erika Conley  [id number] DOB: 03/04/1934 DOA: 12/13/2015 PCP: Leonard Downing, MD  Brief Narrative:   Erika Conley is a 81 y.o. female with medical history significant of HTN, hypothyroidism, GERD, depression; who presented with nausea and vomiting, dysuria, epigastric/lower abdominal discomfort, abdominal distention, and hiccups.  In the ER, she was hypertensive but otherwise VSS.  Sodium was 119.  Urinalysis was negative.  Acute abdominal series x-ray showed no acute abnormalities. TRH called to admit for hyponatremia.  She was started on IVF and her sodium has gradually improved.  She has subsequently developed diarrhea suggesting viral illness.  She denies risk factors for C. Diff such as antibiotics or recent hospitalizations, however, she has had two endoscopic procedures in the last two months.    Assessment & Plan:   Principal Problem:   Hyponatremia Active Problems:   Hypokalemia   Hypertension   Hypothyroidism   Abdominal pain   Nausea and vomiting  Hyponatremia: Acute.  Suspect this is due to appropriate ADH secretion in setting of nausea/vomiting, dehydration, and HCTZ use.  Sodium down to 119 on admission but trending up gradually.  -  No history of cirrhosis (CT scan 10/2015) or significant heart failure (history/ECHO 03/2015) -  No pulmonary nodules on CXR -  TSH wnl -  Check cortisol level tomorrow morning - Tele:  NSR with PVC - Goal to raise sodium no more than 10 mEq per 24 hours - Continue D5 1/2 normal saline at 75 ml/hr just rate as needed,  - BMPs q6h  Nausea, vomiting and abdominal pain likely due to gastroenteritis.  UA negative.  LFTs low, lipase negative.  - Zofran prn N/V -  Continue IVF  Hypokalemia, resolved with IV potassium supplementation  Prolonged QTC 525 msec on initial ECG, but resolved on subsequent ECG despite zofran use. -  Check magnesium   - held zanaflex, hydroxyzine, and Imodium  Essential  hypertension, blood pressures stable.  Continue to hold HCTZ  Hypothyroidism, TSH stable.  Continue levothyroxine  Depression, stable, continue trazodone   GERD, with hiccoughs, continue pepcid   DVT prophylaxis: Lovenox Code Status: DO NOT RESUSCITATE Family Communication: No family present at bedside Disposition Plan: Likely discharge back to facility Wed or Thursday if tolerating some food, diarrhea slowing, and sodium continuing to improve   Consultants:   none  Procedures:  none  Antimicrobials:   none    Subjective:  Still having nausea but now having diarrhea too.  No blood in stools.  No recent hospitalizations or antibiotics.  Has a headache.  Denies focal numbness, tingling, weakness, slurred speech.  Thinking feels clear.  Unable to tolerate breakfast due to nausea.    Objective: Vitals:   12/14/15 0030 12/14/15 0200 12/14/15 0258 12/14/15 0539  BP: 143/69 124/71 (!) 153/56 138/70  Pulse: 67 65 64 66  Resp: 16 17 16 18   Temp:   98.3 F (36.8 C) 99.2 F (37.3 C)  TempSrc:   Oral Oral  SpO2: 90% 90% 93% 95%  Weight:   65.3 kg (143 lb 15.4 oz)   Height:   5' (1.524 m)     Intake/Output Summary (Last 24 hours) at 12/14/15 1359 Last data filed at 12/14/15 1044  Gross per 24 hour  Intake           818.75 ml  Output              200 ml  Net  618.75 ml   Filed Weights   12/14/15 0258  Weight: 65.3 kg (143 lb 15.4 oz)    Examination:  General exam:  Adult female.  No acute distress.  HEENT:  NCAT, MMM Respiratory system: Clear to auscultation bilaterally Cardiovascular system: Regular rate and rhythm, normal S1/S2. No murmurs, rubs, gallops or clicks.  Warm extremities Gastrointestinal system: Hyperactive bowel sounds, soft, mildly distended, nontender. MSK:  Normal tone and bulk, no lower extremity edema Neuro:  Grossly intact    Data Reviewed: I have personally reviewed following labs and imaging studies  CBC:  Recent Labs Lab  12/13/15 2249 12/14/15 0525  WBC 7.4 5.9  NEUTROABS 6.4  --   HGB 14.8 14.6  HCT 39.8 39.6  MCV 78.2 77.8*  PLT 207 123456   Basic Metabolic Panel:  Recent Labs Lab 12/13/15 2249 12/14/15 0525 12/14/15 1053  NA 119* 125* 125*  K 3.3* 3.4* 3.8  CL 81* 88* 90*  CO2 28 28 26   GLUCOSE 128* 121* 148*  BUN 11 8 6   CREATININE 0.46 0.53 0.58  CALCIUM 8.5* 8.4* 8.7*   GFR: Estimated Creatinine Clearance: 48.1 mL/min (by C-G formula based on SCr of 0.58 mg/dL). Liver Function Tests:  Recent Labs Lab 12/14/15 0525  AST 14*  ALT 8*  ALKPHOS 79  BILITOT 1.1  PROT 5.6*  ALBUMIN 3.4*    Recent Labs Lab 12/13/15 2249  LIPASE 23   No results for input(s): AMMONIA in the last 168 hours. Coagulation Profile: No results for input(s): INR, PROTIME in the last 168 hours. Cardiac Enzymes:  Recent Labs Lab 12/14/15 0525  TROPONINI <0.03   BNP (last 3 results) No results for input(s): PROBNP in the last 8760 hours. HbA1C: No results for input(s): HGBA1C in the last 72 hours. CBG: No results for input(s): GLUCAP in the last 168 hours. Lipid Profile: No results for input(s): CHOL, HDL, LDLCALC, TRIG, CHOLHDL, LDLDIRECT in the last 72 hours. Thyroid Function Tests:  Recent Labs  12/14/15 0045  TSH 2.584   Anemia Panel: No results for input(s): VITAMINB12, FOLATE, FERRITIN, TIBC, IRON, RETICCTPCT in the last 72 hours. Urine analysis:    Component Value Date/Time   COLORURINE YELLOW 12/13/2015 [place] 12/13/2015 2354   LABSPEC 1.016 12/13/2015 2354   PHURINE 7.0 12/13/2015 2354   GLUCOSEU NEGATIVE 12/13/2015 2354   HGBUR NEGATIVE 12/13/2015 2354   BILIRUBINUR NEGATIVE 12/13/2015 2354   KETONESUR >80 (A) 12/13/2015 2354   PROTEINUR NEGATIVE 12/13/2015 2354   UROBILINOGEN 0.2 11/21/2014 0501   NITRITE NEGATIVE 12/13/2015 2354   LEUKOCYTESUR NEGATIVE 12/13/2015 2354   Sepsis Labs: @LABRCNTIP (procalcitonin:4,lacticidven:4)  ) Recent Results  (from the past 240 hour(s))  MRSA PCR Screening     Status: None   Collection Time: 12/14/15  3:51 AM  Result Value Ref Range Status   MRSA by PCR NEGATIVE NEGATIVE Final    Comment:        The GeneXpert MRSA Assay (FDA approved for NASAL specimens only), is one component of a comprehensive MRSA colonization surveillance program. It is not intended to diagnose MRSA infection nor to guide or monitor treatment for MRSA infections.       Radiology Studies: Dg Abd Acute W/chest  Result Date: 12/13/2015 CLINICAL DATA:  Vomiting since 1400 hours EXAM: DG ABDOMEN ACUTE W/ 1V CHEST COMPARISON:  10/28/2015 chest radiograph, CT from 10/22/2015 FINDINGS: The heart is top-normal in size. Streaky atelectasis noted at each lung base. There is mild pulmonary vascular congestion  with slightly low lung volumes bilaterally. Slight blunting of left costophrenic angle from small left pleural effusion is suspected. The patient is status post T11 and T12 kyphoplasty. Surgical clips are seen in the right upper quadrant. Supine and decubitus views of the abdomen demonstrate findings of bowel obstruction. Moderate stool burden within large bowel. There is no free intraperitoneal air. Three rounded calcified densities in the right hemipelvis are in keeping with known phleboliths. IMPRESSION: Negative abdominal radiographs.  No acute cardiopulmonary disease. Electronically Signed   By: Ashley Royalty M.D.   On: 12/13/2015 23:02     Scheduled Meds: . antiseptic oral rinse  15 mL Mouth Rinse TID  . calcium-vitamin D  1 tablet Oral Daily  . chlorhexidine  15 mL Mouth/Throat BID  . enoxaparin (LOVENOX) injection  40 mg Subcutaneous Q24H  . fluticasone  2 spray Each Nare Daily  . [START ON 12/15/2015] Influenza vac split quadrivalent PF  0.5 mL Intramuscular Tomorrow-1000  . ketotifen  1 drop Both Eyes QHS  . levothyroxine  112 mcg Oral QAC breakfast  . loratadine  10 mg Oral Daily  . multivitamin  1 tablet Oral  BID  . polyvinyl alcohol  1 drop Both Eyes Q4H while awake  . sodium chloride flush  3 mL Intravenous Q12H  . traZODone  200 mg Oral QHS   Continuous Infusions: . dextrose 5 % and 0.45% NaCl 75 mL/hr at 12/14/15 1316     LOS: 0 days    Time spent: 30 min    Janece Canterbury, MD [facility] Pager [phone]  If 7PM-7AM, please contact night-coverage [email or web address] Password [email or web address] 12/14/2015, 1:59 PM

## 2015-12-14 NOTE — ED Notes (Signed)
In and Out cath  [facility] and [employer].

## 2015-12-14 NOTE — Progress Notes (Signed)
PCR results were negative--Isolation removed per protocol

## 2015-12-14 NOTE — Progress Notes (Signed)
Pt has had several loose BM, MD aware---states diarrhea is suggesting viral. Will continue to monitor. SRP, RN

## 2015-12-15 DIAGNOSIS — E876 Hypokalemia: Secondary | ICD-10-CM

## 2015-12-15 DIAGNOSIS — R112 Nausea with vomiting, unspecified: Secondary | ICD-10-CM

## 2015-12-15 DIAGNOSIS — I10 Essential (primary) hypertension: Secondary | ICD-10-CM

## 2015-12-15 DIAGNOSIS — E039 Hypothyroidism, unspecified: Secondary | ICD-10-CM

## 2015-12-15 DIAGNOSIS — E871 Hypo-osmolality and hyponatremia: Secondary | ICD-10-CM

## 2015-12-15 LAB — BASIC METABOLIC PANEL
ANION GAP: 5 (ref 5–15)
ANION GAP: 8 (ref 5–15)
BUN: 5 mg/dL — ABNORMAL LOW (ref 6–20)
BUN: 5 mg/dL — ABNORMAL LOW (ref 6–20)
CALCIUM: 9 mg/dL (ref 8.9–10.3)
CHLORIDE: 104 mmol/L (ref 101–111)
CO2: 29 mmol/L (ref 22–32)
CO2: 27 mmol/L (ref 22–32)
Calcium: 8.7 mg/dL — ABNORMAL LOW (ref 8.9–10.3)
Chloride: 102 mmol/L (ref 101–111)
Creatinine, Ser: 0.68 mg/dL (ref 0.44–1.00)
Creatinine, Ser: 0.76 mg/dL (ref 0.44–1.00)
GLUCOSE: 124 mg/dL — AB (ref 65–99)
Glucose, Bld: 135 mg/dL — ABNORMAL HIGH (ref 65–99)
POTASSIUM: 3.3 mmol/L — AB (ref 3.5–5.1)
POTASSIUM: 3.7 mmol/L (ref 3.5–5.1)
SODIUM: 138 mmol/L (ref 135–145)
SODIUM: 137 mmol/L (ref 135–145)

## 2015-12-15 LAB — MAGNESIUM: MAGNESIUM: 1.9 mg/dL (ref 1.7–2.4)

## 2015-12-15 LAB — CORTISOL-AM, BLOOD: CORTISOL - AM: 5.1 ug/dL — AB (ref 6.7–22.6)

## 2015-12-15 MED ORDER — GI COCKTAIL ~~LOC~~
30.0000 mL | Freq: Once | ORAL | Status: AC
  Administered 2015-12-15: 30 mL via ORAL
  Filled 2015-12-15: qty 30

## 2015-12-15 MED ORDER — LORAZEPAM 1 MG PO TABS
1.0000 mg | ORAL_TABLET | Freq: Once | ORAL | Status: AC
  Administered 2015-12-15: 1 mg via ORAL
  Filled 2015-12-15: qty 1

## 2015-12-15 NOTE — Progress Notes (Signed)
PROGRESS NOTE  [name]  [id number] DOB: Mar 14, 1934 DOA: 12/13/2015 PCP: Leonard Downing, MD  Brief Narrative:   Erika Conley is a 81 y.o. female with medical history significant of HTN, hypothyroidism, GERD, depression; who presented with nausea and vomiting, dysuria, epigastric/lower abdominal discomfort, abdominal distention, and hiccups.  In the ER, she was hypertensive but otherwise VSS.  Sodium was 119.  Urinalysis was negative.  Acute abdominal series x-ray showed no acute abnormalities. TRH called to admit for hyponatremia.  She was started on IVF and her sodium has gradually improved.  She has subsequently developed diarrhea suggesting viral illness.  She denies risk factors for C. Diff such as antibiotics or recent hospitalizations, however, she has had two endoscopic procedures in the last two months.    Assessment & Plan:   Principal Problem:   Hyponatremia Active Problems:   Hypokalemia   Hypertension   Hypothyroidism   Abdominal pain   Nausea and vomiting  Hyponatremia: Acute.  Suspect this is due to appropriate ADH secretion in setting of nausea/vomiting, dehydration, and HCTZ use.  Sodium down to 119 on admission but trending up gradually.  -  No history of cirrhosis (CT scan 10/2015) or significant heart failure (history/ECHO 03/2015) -  No pulmonary nodules on CXR -  TSH wnl -  AM cortisol a little low, sodium has corrected - Tele:  NSR with PVC - Continue D5 1/2 normal saline at 75 ml/hr discontinued 10/25.  - BMPs have stabilized and sodium has corrected.    Nausea, vomiting and abdominal pain - resolved now, was likely due to gastroenteritis.  UA negative.  LFTs low, lipase negative.  - Zofran prn N/V - symptoms resolved now  Hypokalemia, resolved with IV potassium supplementation  Prolonged QTC 525 msec on initial ECG, but resolved on subsequent ECG despite zofran use. - magnesium pending. - held zanaflex, hydroxyzine, and  Imodium  Essential hypertension, blood pressures stable.  Continue to hold HCTZ  Hypothyroidism, TSH stable.  Continue levothyroxine  Depression, stable, continue trazodone   GERD, with hiccoughs, continue pepcid  DVT prophylaxis: Lovenox Code Status: DO NOT RESUSCITATE Family Communication: No family present at bedside Disposition Plan: Likely discharge back to facility 10/26 with HHPT  Consultants:   none  Procedures:  none  Antimicrobials:   none   Subjective:  Pt says that she is feeling much better today.  No further diarrhea.    Objective: Vitals:   12/14/15 0539 12/14/15 1427 12/14/15 2036 12/15/15 0609  BP: 138/70 (!) 142/67 (!) 130/59 (!) 143/61  Pulse: 66 68 (!) 59 (!) 59  Resp: 18 19 19 18   Temp: 99.2 F (37.3 C) 99.9 F (37.7 C) 98.1 F (36.7 C) 98.3 F (36.8 C)  TempSrc: Oral Oral Oral Oral  SpO2: 95% 97% 96% 94%  Weight:    64 kg (141 lb 1.5 oz)  Height:        Intake/Output Summary (Last 24 hours) at 12/15/15 1336 Last data filed at 12/15/15 0900  Gross per 24 hour  Intake             2085 ml  Output             1700 ml  Net              385 ml   Filed Weights   12/14/15 0258 12/15/15 0609  Weight: 65.3 kg (143 lb 15.4 oz) 64 kg (141 lb 1.5 oz)    Examination:  General exam:  Adult female.  No acute distress.  HEENT:  NCAT, MMM Respiratory system: Clear to auscultation bilaterally Cardiovascular system: Regular rate and rhythm, normal S1/S2. No murmurs, rubs, gallops or clicks.  Warm extremities Gastrointestinal system: Hyperactive bowel sounds, soft, mildly distended, nontender. MSK:  Normal tone and bulk, no lower extremity edema Neuro:  Grossly intact   Data Reviewed: I have personally reviewed following labs and imaging studies  CBC:  Recent Labs Lab 12/13/15 2249 12/14/15 0525  WBC 7.4 5.9  NEUTROABS 6.4  --   HGB 14.8 14.6  HCT 39.8 39.6  MCV 78.2 77.8*  PLT 207 123456   Basic Metabolic Panel:  Recent  Labs Lab 12/14/15 1053 12/14/15 1551 12/14/15 2150 12/15/15 0405 12/15/15 0952  NA 125* 132* 135 138 137  K 3.8 3.2* 3.4* 3.3* 3.7  CL 90* 96* 101 104 102  CO2 26 27 28 29 27   GLUCOSE 148* 150* 133* 135* 124*  BUN 6 6 7  5* 5*  CREATININE 0.58 0.70 0.67 0.68 0.76  CALCIUM 8.7* 8.8* 8.5* 8.7* 9.0   GFR: Estimated Creatinine Clearance: 47.6 mL/min (by C-G formula based on SCr of 0.76 mg/dL). Liver Function Tests:  Recent Labs Lab 12/14/15 0525  AST 14*  ALT 8*  ALKPHOS 79  BILITOT 1.1  PROT 5.6*  ALBUMIN 3.4*    Recent Labs Lab 12/13/15 2249  LIPASE 23   No results for input(s): AMMONIA in the last 168 hours. Coagulation Profile: No results for input(s): INR, PROTIME in the last 168 hours. Cardiac Enzymes:  Recent Labs Lab 12/14/15 0525  TROPONINI <0.03   BNP (last 3 results) No results for input(s): PROBNP in the last 8760 hours. HbA1C: No results for input(s): HGBA1C in the last 72 hours. CBG: No results for input(s): GLUCAP in the last 168 hours. Lipid Profile: No results for input(s): CHOL, HDL, LDLCALC, TRIG, CHOLHDL, LDLDIRECT in the last 72 hours. Thyroid Function Tests:  Recent Labs  12/14/15 0045  TSH 2.584   Anemia Panel: No results for input(s): VITAMINB12, FOLATE, FERRITIN, TIBC, IRON, RETICCTPCT in the last 72 hours. Urine analysis:    Component Value Date/Time   COLORURINE YELLOW 12/13/2015 2354   APPEARANCEUR CLEAR 12/13/2015 2354   LABSPEC 1.016 12/13/2015 2354   PHURINE 7.0 12/13/2015 2354   GLUCOSEU NEGATIVE 12/13/2015 2354   HGBUR NEGATIVE 12/13/2015 2354   BILIRUBINUR NEGATIVE 12/13/2015 2354   KETONESUR >80 (A) 12/13/2015 2354   PROTEINUR NEGATIVE 12/13/2015 2354   UROBILINOGEN 0.2 11/21/2014 0501   NITRITE NEGATIVE 12/13/2015 2354   LEUKOCYTESUR NEGATIVE 12/13/2015 2354   Sepsis Labs: @LABRCNTIP (procalcitonin:4,lacticidven:4)  ) Recent Results (from the past 240 hour(s))  MRSA PCR Screening     Status: None    Collection Time: 12/14/15  3:51 AM  Result Value Ref Range Status   MRSA by PCR NEGATIVE NEGATIVE Final    Comment:        The GeneXpert MRSA Assay (FDA approved for NASAL specimens only), is one component of a comprehensive MRSA colonization surveillance program. It is not intended to diagnose MRSA infection nor to guide or monitor treatment for MRSA infections.     Radiology Studies: Dg Abd Acute W/chest  Result Date: 12/13/2015 CLINICAL DATA:  Vomiting since 1400 hours EXAM: DG ABDOMEN ACUTE W/ 1V CHEST COMPARISON:  10/28/2015 chest radiograph, CT from 10/22/2015 FINDINGS: The heart is top-normal in size. Streaky atelectasis noted at each lung base. There is mild pulmonary vascular congestion with slightly low lung volumes bilaterally. Slight blunting of  left costophrenic angle from small left pleural effusion is suspected. The patient is status post T11 and T12 kyphoplasty. Surgical clips are seen in the right upper quadrant. Supine and decubitus views of the abdomen demonstrate findings of bowel obstruction. Moderate stool burden within large bowel. There is no free intraperitoneal air. Three rounded calcified densities in the right hemipelvis are in keeping with known phleboliths. IMPRESSION: Negative abdominal radiographs.  No acute cardiopulmonary disease. Electronically Signed   By: Ashley Royalty M.D.   On: 12/13/2015 23:02   Scheduled Meds: . antiseptic oral rinse  15 mL Mouth Rinse TID  . calcium-vitamin D  1 tablet Oral Daily  . chlorhexidine  15 mL Mouth/Throat BID  . enoxaparin (LOVENOX) injection  40 mg Subcutaneous Q24H  . fluticasone  2 spray Each Nare Daily  . Influenza vac split quadrivalent PF  0.5 mL Intramuscular Tomorrow-1000  . ketotifen  1 drop Both Eyes QHS  . levothyroxine  112 mcg Oral QAC breakfast  . loratadine  10 mg Oral Daily  . multivitamin  1 tablet Oral BID  . polyvinyl alcohol  1 drop Both Eyes Q4H while awake  . sodium chloride flush  3 mL  Intravenous Q12H  . traZODone  200 mg Oral QHS   Continuous Infusions:    LOS: 1 day   Time spent: 20 min  Irwin Brakeman, MD [facility] Pager [phone]  If 7PM-7AM, please contact night-coverage [email or web address] Password [email or web address] 12/15/2015, 1:36 PM

## 2015-12-15 NOTE — Progress Notes (Signed)
Physical Therapy Treatment Patient Details Name: Erika Conley MRN: [id number] DOB: 1934/08/28 Today's Date: 12/15/2015    History of Present Illness 81 yo female admitted with hyponatremia, N/V. hx of vertigo, fibromyalgia, falls, HTN, arthritis, anxiety    PT Comments    Pt continues to participate well with therapy. Recommend daily ambulation in hallway with nursing supervision as well.   Follow Up Recommendations  Home health PT (at ALF)     Equipment Recommendations  None recommended by PT    Recommendations for Other Services       Precautions / Restrictions Precautions Precautions: Fall Restrictions Weight Bearing Restrictions: No    Mobility  Bed Mobility Overal bed mobility: Modified Independent                Transfers Overall transfer level: Needs assistance Equipment used: Rolling walker (2 wheeled) Transfers: Sit to/from Stand Sit to Stand: Min guard         General transfer comment: close guard for safety  Ambulation/Gait Ambulation/Gait assistance: Min guard Ambulation Distance (Feet): 100 Feet Assistive device: Rolling walker (2 wheeled) Gait Pattern/deviations: Step-through pattern;Decreased stride length     General Gait Details: close guard for safety   Stairs            Wheelchair Mobility    Modified Rankin (Stroke Patients Only)       Balance Overall balance assessment: History of Falls           Standing balance-Leahy Scale: Poor Standing balance comment: requires RW                    Cognition Arousal/Alertness: Awake/alert Behavior During Therapy: WFL for tasks assessed/performed Overall Cognitive Status: Within Functional Limits for tasks assessed                      Exercises      General Comments        Pertinent Vitals/Pain Pain Assessment: No/denies pain    Home Living                      Prior Function            PT Goals (current goals can now be  found in the care plan section) Progress towards PT goals: Progressing toward goals    Frequency    Min 3X/week      PT Plan Current plan remains appropriate    Co-evaluation             End of Session Equipment Utilized During Treatment: Gait belt Activity Tolerance: Patient tolerated treatment well Patient left: in bed;with call bell/phone within reach;with bed alarm set     Time: [id number] PT Time Calculation (min) (ACUTE ONLY): 14 min  Charges:  $Gait Training: 8-22 mins                    G Codes:      Weston Anna, MPT Pager: [phone]

## 2015-12-16 DIAGNOSIS — R109 Unspecified abdominal pain: Secondary | ICD-10-CM

## 2015-12-16 MED ORDER — TRAMADOL HCL 50 MG PO TABS: 25.0000 mg | ORAL_TABLET | Freq: Three times a day (TID) | ORAL | 0 refills | Status: DC | PRN

## 2015-12-16 NOTE — Care Management Note (Signed)
Case Management Note  Patient Details  Name: Erika Conley MRN: [id number] Date of Birth: [birth year]  Subjective/Objective:HHPT ordered. Patient active w/[facility]rep Abby aware of d/c, & orders. CSW managing return back to [facility] [place](patient agreeable to return)No further CM needs.                    Action/Plan:d/c ALF w/HHPT   Expected Discharge Date:                  Expected Discharge Plan:  Assisted Living / Rest Home  In-House Referral:  Clinical Social Work  Discharge planning Services  CM Consult  Post Acute Care Choice:  Home Health (Active w/[facility]-HHPT/HHOT) Choice offered to:  Patient  DME Arranged:    DME Agency:     HH Arranged:  PT HH Agency:  [place]  Status of Service:  Completed, signed off  If discussed at [place] of Stay Meetings, dates discussed:    Additional Comments:  Dessa Phi, RN 12/16/2015, 10:40 AM

## 2015-12-16 NOTE — Discharge Instructions (Signed)
Hyponatremia Hyponatremia is when the amount of salt (sodium) in your blood is too low. When salt levels are low, your cells absorb extra water and they swell. The swelling happens throughout the body, but it mostly affects the brain.  HOME CARE  Take medicines only as told by your doctor. Many medicines can make this condition worse. Talk with your doctor about any medicines that you are currently taking.  Carefully follow a recommended diet as told by your doctor.  Carefully follow instructions from your doctor about fluid restrictions.  Keep all follow-up visits as told by your doctor. This is important.  Do not drink alcohol. GET HELP IF:  You feel sicker to your stomach (nauseous).  You feel more confused.  You feel more tired (fatigued).  Your headache gets worse.  You feel weaker.  Your symptoms go away and then they come back.  You have trouble following the diet instructions. GET HELP RIGHT AWAY IF:  You start to twitch and shake (have a seizure).  You pass out (faint).  You keep having watery poop (diarrhea).  You keep throwing up (vomiting).   This information is not intended to replace advice given to you by your health care provider. Make sure you discuss any questions you have with your health care provider.   Document Released: 10/19/2010 Document Revised: 06/23/2014 Document Reviewed: 02/02/2014 Elsevier Interactive Patient Education 2016 Elsevier Inc.    Sodium (Na) Test Sodium (Na) is an important mineral (electrolyte) in your body. You need a normal amount of sodium to balance the amount of fluid you have in your body. If your sodium level is too high (hypernatremia), you can retain too much fluid. If your sodium level is too low (hyponatremia), you may lose too much fluid. Sodium is also important for proper nerve and muscle function. You get sodium from the salt in your diet. Chemical messengers (hormones) made in glands located above your kidneys  (adrenal glands) control how much sodium your body keeps or gets rid of in your urine. This test requires a blood sample taken from a vein in your hand or arm. You may have this test along with tests for other electrolytes as part of routine blood work. You may also have a sodium test if:  You have signs of hypernatremia. These include:  Being thirsty.  Having reduced urination.  Having muscle twitching.  You have signs of hyponatremia, such as weakness or confusion.  You are given fluids through a vein (intravenously).  You take water pills (diuretics).  Your health care provider needs to monitor a condition that can cause your fluids to be unbalanced, such as:  High blood pressure.  Heart disease.  Kidney disease.  Liver disease. PREPARATION FOR TEST Many medicines can interfere with your sodium level. Tell your health care provider about all the medicines you take. You may need to stop taking some medicines before having this test. These can include:  Certain antibiotics.  Certain high blood pressure drugs.  Diuretics.  Birth control pills. RESULTS It is your responsibility to obtain your test results. Ask the lab or department performing the test when and how you will get your results. Contact your health care provider to discuss any questions you have about your results.  Results of a sodium test will be a range of values. Sodium is measured in milliequivalents per liter (mEq/L).  Range of Normal Values Ranges for normal values may vary among different labs and hospitals. You should always check with  your health care provider after having lab work or other tests done to discuss whether your values are considered within normal limits.  The normal ranges for sodium are:  Adult/elderly: 136-145 mEq/L or 136/145 mmol/L (SI units).  Child: 136-145 mEq/L.  Infant: 134-150 mEq/L.  Newborn: 134-144 mEq/L. Meaning of Results Outside Normal Value Ranges Many different  factors can cause a high or low sodium level.  Common causes of hypernatremia include:  Making (secreting) too many hormones.  Not drinking enough fluids (dehydration).  Having too much salt in your diet. Common causes of hyponatremia include:  Undersecreting certain hormones.  Drinking too much water.  Losing sodium through sweating, vomiting, or diarrhea.  Taking a diuretic.  Having kidney disease. Discuss the meaning of your results with your health care provider. You may need to have additional tests to measure your sodium level.    This information is not intended to replace advice given to you by your health care provider. Make sure you discuss any questions you have with your health care provider.   Document Released: 03/11/2004 Document Revised: 02/27/2014 Document Reviewed: 05/29/2013 Elsevier Interactive Patient Education 2016 Elsevier Inc.   Nausea and Vomiting Nausea means you feel sick to your stomach. Throwing up (vomiting) is a reflex where stomach contents come out of your mouth. HOME CARE   Take medicine as told by your doctor.  Do not force yourself to eat. However, you do need to drink fluids.  If you feel like eating, eat a normal diet as told by your doctor.  Eat rice, wheat, potatoes, bread, lean meats, yogurt, fruits, and vegetables.  Avoid high-fat foods.  Drink enough fluids to keep your pee (urine) clear or pale yellow.  Ask your doctor how to replace body fluid losses (rehydrate). Signs of body fluid loss (dehydration) include:  Feeling very thirsty.  Dry lips and mouth.  Feeling dizzy.  Dark pee.  Peeing less than normal.  Feeling confused.  Fast breathing or heart rate. GET HELP RIGHT AWAY IF:   You have blood in your throw up.  You have black or bloody poop (stool).  You have a bad headache or stiff neck.  You feel confused.  You have bad belly (abdominal) pain.  You have chest pain or trouble breathing.  You do  not pee at least once every 8 hours.  You have cold, clammy skin.  You keep throwing up after 24 to 48 hours.  You have a fever. MAKE SURE YOU:   Understand these instructions.  Will watch your condition.  Will get help right away if you are not doing well or get worse.   This information is not intended to replace advice given to you by your health care provider. Make sure you discuss any questions you have with your health care provider.   Document Released: 07/26/2007 Document Revised: 05/01/2011 Document Reviewed: 07/08/2010 Elsevier Interactive Patient Education 2016 Elsevier Inc.   Syndrome of Inappropriate Antidiuretic Hormone Syndrome of inappropriate antidiuretic hormone (SIADH) is when excess antidiuretic hormone (ADH) is produced in the body. This hormone is produced in a part of the brain called the hypothalamus. ADH controls water levels in the body. When too much of the hormone is produced, this can cause problems such as low salt (sodium) levels in your blood and reduced urine secretion. The body retains too much water, which can cause various symptoms. Symptoms can range from mild to severe. SIADH is most common in people who are hospitalized. CAUSES  Several  conditions can cause SIADH, including:  Heart failure.  Injured or diseased hypothalamus.  Infection or inflammation in the brain, such as meningitis or encephalitis.  Lung disease or cancer.  Tumor.  Psychosis.  Major surgery.  Stroke.  Guillain-Barre syndrome.  Medicines, including chemotherapy drugs, NSAIDs, and pain killers.  Abnormalities in thyroid or parathyroid hormones.  Human immunodeficiency virus (HIV).  Hereditary causes. RISK FACTORS  Increasing age.  Recent major surgery.  Hospitalization. SIGNS AND SYMPTOMS  Irritability.  Cramps.  Nausea or vomiting.  Mental changes, such as memory problems, aggressiveness, confusion, or  hallucinations.  Seizure.  Coma. DIAGNOSIS  Diagnosis may include:  Medical history and physical exam.  Blood tests.  Urine tests.  Chest X-ray or brain scan. TREATMENT  Treatments can help reduce the symptoms of SIADH. Treatment may include the following:  Restriction of your fluid intake.  Saline given through an IV tube or oral salt tablets to raise sodium levels to normal.  Medicines to help eliminate water from the body (diuretics).  Medicines to help control sodium levels. Various treatments may also be done to manage the underlying cause of your SIADH. HOME CARE INSTRUCTIONS  Follow your health care provider's instructions on restricting your intake of fluids.  Take medicines only as directed by your health care provider.  Keep all follow-up visits as directed by your health care provider. This is important. SEEK MEDICAL CARE IF:  You have cramping.  You are nauseous or vomiting.  You have mental changes, such as memory problems, aggressiveness, confusion, or hallucinations. SEEK IMMEDIATE MEDICAL CARE IF: You have a seizure or loss of consciousness.    This information is not intended to replace advice given to you by your health care provider. Make sure you discuss any questions you have with your health care provider.   Document Released: 09/03/2013 Document Reviewed: 09/03/2013 Elsevier Interactive Patient Education 2016 [place].  Generalized Anxiety Disorder Generalized anxiety disorder (GAD) is a mental disorder. It interferes with life functions, including relationships, work, and school. GAD is different from normal anxiety, which everyone experiences at some point in their lives in response to specific life events and activities. Normal anxiety actually helps [place] prepare for and get through these life events and activities. Normal anxiety goes away after the event or activity is over.  GAD causes anxiety that is not necessarily related to  specific events or activities. It also causes excess anxiety in proportion to specific events or activities. The anxiety associated with GAD is also difficult to control. GAD can vary from mild to severe. People with severe GAD can have intense waves of anxiety with physical symptoms (panic attacks).  SYMPTOMS The anxiety and worry associated with GAD are difficult to control. This anxiety and worry are related to many life events and activities and also occur more days than not for 6 months or longer. People with GAD also have three or more of the following symptoms (one or more in children):  Restlessness.   Fatigue.  Difficulty concentrating.   Irritability.  Muscle tension.  Difficulty sleeping or unsatisfying sleep. DIAGNOSIS GAD is diagnosed through an assessment by your health care provider. Your health care provider will ask you questions aboutyour mood,physical symptoms, and events in your life. Your health care provider may ask you about your medical history and use of alcohol or drugs, including prescription medicines. Your health care provider may also do a physical exam and blood tests. Certain medical conditions and the use of certain substances can  cause symptoms similar to those associated with GAD. Your health care provider may refer you to a mental health specialist for further evaluation. TREATMENT The following therapies are usually used to treat GAD:   Medication. Antidepressant medication usually is prescribed for long-term daily control. Antianxiety medicines may be added in severe cases, especially when panic attacks occur.   Talk therapy (psychotherapy). Certain types of talk therapy can be helpful in treating GAD by providing support, education, and guidance. A form of talk therapy called cognitive behavioral therapy can teach you healthy ways to think about and react to daily life events and activities.  Stress managementtechniques. These include yoga,  meditation, and exercise and can be very helpful when they are practiced regularly. A mental health specialist can help determine which treatment is best for you. Some people see improvement with one therapy. However, other people require a combination of therapies.   This information is not intended to replace advice given to you by your health care provider. Make sure you discuss any questions you have with your health care provider.   Document Released: 06/03/2012 Document Revised: 02/27/2014 Document Reviewed: 06/03/2012 Elsevier Interactive Patient Education 2016 Elsevier Inc.   Low-Sodium Eating Plan Sodium raises blood pressure and causes water to be held in the body. Getting less sodium from food will help lower your blood pressure, reduce any swelling, and protect your heart, liver, and kidneys. We get sodium by adding salt (sodium chloride) to food. Most of our sodium comes from canned, boxed, and frozen foods. Restaurant foods, fast foods, and pizza are also very high in sodium. Even if you take medicine to lower your blood pressure or to reduce fluid in your body, getting less sodium from your food is important. WHAT IS MY PLAN? Most people should limit their sodium intake to 2,300 mg a day. Your health care provider recommends that you limit your sodium intake to __________ a day.  WHAT DO I NEED TO KNOW ABOUT THIS EATING PLAN? For the low-sodium eating plan, you will follow these general guidelines:  Choose foods with a % Daily Value for sodium of less than 5% (as listed on the food label).   Use salt-free seasonings or herbs instead of table salt or sea salt.   Check with your health care provider or pharmacist before using salt substitutes.   Eat fresh foods.  Eat more vegetables and fruits.  Limit canned vegetables. If you do use them, rinse them well to decrease the sodium.   Limit cheese to 1 oz (28 g) per day.   Eat lower-sodium products, often labeled as  "lower sodium" or "no salt added."  Avoid foods that contain monosodium glutamate (MSG). MSG is sometimes added to [place] food and some canned foods.  Check food labels (Nutrition Facts labels) on foods to learn how much sodium is in one serving.  Eat more home-cooked food and less restaurant, buffet, and fast food.  When eating at a restaurant, ask that your food be prepared with less salt, or no salt if possible.  HOW DO I READ FOOD LABELS FOR SODIUM INFORMATION? The Nutrition Facts label lists the amount of sodium in one serving of the food. If you eat more than one serving, you must multiply the listed amount of sodium by the number of servings. Food labels may also identify foods as:  Sodium free--Less than 5 mg in a serving.  Very low sodium--35 mg or less in a serving.  Low sodium--140 mg or less in  a serving.  Light in sodium--50% less sodium in a serving. For example, if a food that usually has 300 mg of sodium is changed to become light in sodium, it will have 150 mg of sodium.  Reduced sodium--25% less sodium in a serving. For example, if a food that usually has 400 mg of sodium is changed to reduced sodium, it will have 300 mg of sodium. WHAT FOODS CAN I EAT? Grains Low-sodium cereals, including oats, puffed wheat and rice, and shredded wheat cereals. Low-sodium crackers. Unsalted rice and pasta. Lower-sodium bread.  Vegetables Frozen or fresh vegetables. Low-sodium or reduced-sodium canned vegetables. Low-sodium or reduced-sodium tomato sauce and paste. Low-sodium or reduced-sodium tomato and vegetable juices.  Fruits Fresh, frozen, and canned fruit. Fruit juice.  Meat and Other Protein Products Low-sodium canned tuna and salmon. Fresh or frozen meat, poultry, seafood, and fish. Lamb. Unsalted nuts. Dried beans, peas, and lentils without added salt. Unsalted canned beans. Homemade soups without salt. Eggs.  Dairy Milk. Soy milk. Ricotta cheese. Low-sodium or  reduced-sodium cheeses. Yogurt.  Condiments Fresh and dried herbs and spices. Salt-free seasonings. Onion and garlic powders. Low-sodium varieties of mustard and ketchup. Fresh or refrigerated horseradish. Lemon juice.  Fats and Oils Reduced-sodium salad dressings. Unsalted butter.  Other Unsalted popcorn and pretzels.  The items listed above may not be a complete list of recommended foods or beverages. Contact your dietitian for more options. WHAT FOODS ARE NOT RECOMMENDED? Grains Instant hot cereals. Bread stuffing, pancake, and biscuit mixes. Croutons. Seasoned rice or pasta mixes. Noodle soup cups. Boxed or frozen macaroni and cheese. Self-rising flour. Regular salted crackers. Vegetables Regular canned vegetables. Regular canned tomato sauce and paste. Regular tomato and vegetable juices. Frozen vegetables in sauces. Salted [place] fries. Olives. Angie Fava. Relishes. Sauerkraut. Salsa. Meat and Other Protein Products Salted, canned, smoked, spiced, or pickled meats, seafood, or fish. Bacon, ham, sausage, hot dogs, corned beef, chipped beef, and packaged luncheon meats. Salt pork. Jerky. Pickled herring. Anchovies, regular canned tuna, and sardines. Salted nuts. Dairy Processed cheese and cheese spreads. Cheese curds. Blue cheese and cottage cheese. Buttermilk.  Condiments Onion and garlic salt, seasoned salt, table salt, and sea salt. Canned and packaged gravies. Worcestershire sauce. Tartar sauce. Barbecue sauce. Teriyaki sauce. Soy sauce, including reduced sodium. Steak sauce. Fish sauce. Oyster sauce. Cocktail sauce. Horseradish that you find on the shelf. Regular ketchup and mustard. Meat flavorings and tenderizers. Bouillon cubes. Hot sauce. Tabasco sauce. Marinades. Taco seasonings. Relishes. Fats and Oils Regular salad dressings. Salted butter. Margarine. Ghee. Bacon fat.  Other Potato and tortilla chips. Corn chips and puffs. Salted popcorn and pretzels. Canned or dried  soups. Pizza. Frozen entrees and pot pies.  The items listed above may not be a complete list of foods and beverages to avoid. Contact your dietitian for more information.   This information is not intended to replace advice given to you by your health care provider. Make sure you discuss any questions you have with your health care provider.   Document Released: 07/29/2001 Document Revised: 02/27/2014 Document Reviewed: 12/11/2012 Elsevier Interactive Patient Education [employer].

## 2015-12-16 NOTE — Progress Notes (Signed)
Patient is set to discharge back to [facility] today. Patient made aware. Discharge packet given to RN, Anderson Malta. ALF to pickup patient.    Raynaldo Opitz, [facility] Clinical Social Worker cell #: [phone]

## 2015-12-16 NOTE — Progress Notes (Signed)
Pt to be discharged to [facility] this afternoon. Report called to facility Lavone Nian RN accepting report for the facility.

## 2015-12-16 NOTE — Progress Notes (Signed)
[facility] called for transport.     Ion Gonnella, LCSW [facility] Clinical Social Worker cell #: [phone]  

## 2015-12-16 NOTE — Discharge Summary (Signed)
Physician Discharge Summary  Erika Conley [id number] DOB: [birth year] DOA: 12/13/2015  PCP: Leonard Downing, MD  Admit date: 12/13/2015 Discharge date: 12/16/2015  Admitted From: ALF Disposition:  ALF   Recommendations for outpatient follow up:  recheck BMP on follow up with PCP   Discharge Condition: STABLE CODE STATUS: DNR  Brief/Interim Summary: Erika Fassbender Ryanis a 81 y.o.femalewith medical history significant of HTN, hypothyroidism, GERD, depression; who presented with nausea and vomiting, dysuria, epigastric/lower abdominal discomfort, abdominal distention, and hiccups.  In the ER, she was hypertensive but otherwise VSS.  Sodium was 119.  Urinalysis was negative. Acute abdominal series x-ray showed no acute abnormalities. [facility] called to admit for hyponatremia.  She was started on IVF and her sodium has gradually improved.  She has subsequently developed diarrhea suggesting viral illness.  She denies risk factors for C. Diff such as antibiotics or recent hospitalizations, however, she has had two endoscopic procedures in the last two months.    Hyponatremia:Acute.  (resolved now) Suspect this is due to inappropriate ADH secretion in setting of nausea/vomiting, dehydration, and HCTZ use.  Sodium down to 119 on admission but trending up gradually.  -  No history of cirrhosis (CT scan 10/2015) or significant heart failure (history/ECHO 03/2015) -  No pulmonary nodules on CXR -  TSH wnl -  AM cortisol a little low, sodium has corrected - D5 1/2 normal saline at 75 ml/hrdiscontinued 10/25.  - BMPs have stabilized and sodium has corrected.    Nausea,vomiting andabdominal pain - resolved now, was likely due togastroenteritis.  UA negative.  LFTs low, lipase negative.  - Zofran prn N/V - symptoms resolved now  Hypokalemia, resolved with IV potassium supplementation  Prolonged QTC 525 msec on initial ECG, but resolved on subsequent ECG.  Essential hypertension, blood  pressures stable.  Continue to hold HCTZ, Did not restart at discharge  Hypothyroidism, TSH stable.  Continue levothyroxine  Depression, stable, continue trazodone   GERD, with hiccoughs, resume home PPI  DVT prophylaxis:Lovenox Code Status:DO NOT RESUSCITATE Family Communication:No family present at bedside Disposition Plan:Likely discharge back to facility 10/26 with HHPT  Consultants:   none  Procedures:  none  Antimicrobials:   none   Discharge Diagnoses:  Principal Problem:   Hyponatremia Active Problems:   Hypokalemia   Hypertension   Hypothyroidism   Abdominal pain   Nausea and vomiting  Discharge Instructions    Medication List    STOP taking these medications   albuterol 108 (90 Base) MCG/ACT inhaler Commonly known as:  PROVENTIL HFA;VENTOLIN HFA   hydrochlorothiazide 25 MG tablet Commonly known as:  HYDRODIURIL   HYDROcodone-acetaminophen 5-325 MG tablet Commonly known as:  NORCO/VICODIN     TAKE these medications   aluminum hydroxide-magnesium carbonate 95-358 MG/15ML Susp Commonly known as:  GAVISCON Take 10 mLs by mouth 4 (four) times daily.   antiseptic oral rinse Liqd Swish and spit 15 mLs 3 (three) times daily.   benzocaine-resorcinol 5-2 % vaginal cream Commonly known as:  VAGISIL Place 1 application vaginally as needed for itching.   bismuth subsalicylate 99991111 [id number] suspension Commonly known as:  PEPTO BISMOL Take 30 mLs by mouth every 4 (four) hours as needed for diarrhea or loose stools.   calcium-vitamin D 500-200 MG-UNIT tablet Commonly known as:  OSCAL WITH D Take 1 tablet by mouth daily.   Carboxymethylcellul-Glycerin 0.5-0.9 % Soln Apply to eye every 4 (four) hours. REFRESH  EYE DROP   cetirizine 10 MG tablet Commonly known as:  ZYRTEC Take  10 mg by mouth at bedtime.   chlorhexidine 0.12 % solution Commonly known as:  PERIDEX Swish and spit 15 mLs 2 (two) times daily.   DELSYM 30 MG/5ML  liquid Generic drug:  dextromethorphan Take 10 mLs by mouth every 12 (twelve) hours as needed for cough.   Dexlansoprazole 30 MG capsule Take 30 mg by mouth daily.   diclofenac sodium 1 % Gel Commonly known as:  VOLTAREN Apply topically every 6 (six) hours as needed (pain).   fluticasone 50 MCG/ACT nasal spray Commonly known as:  FLONASE Place 2 sprays into both nostrils daily.   hydrocortisone cream 1 % Apply 1 application topically every 6 (six) hours as needed for itching.   hydrOXYzine 25 MG capsule Commonly known as:  VISTARIL Take 25 mg by mouth 3 (three) times daily.   JOHNSONS BABY SHAMPOO EX Apply topically. Dilute a pea size amount with water; close right eye, wash and rinse. Repeat twice daily for 2 days prior to injection.   LASTACAFT 0.25 % Soln Generic drug:  Alcaftadine Place 1 drop into both eyes at bedtime.   levothyroxine 112 MCG tablet Commonly known as:  SYNTHROID, LEVOTHROID Take 112 mcg by mouth daily before breakfast.   loperamide 2 MG capsule Commonly known as:  IMODIUM Take 2 mg by mouth daily as needed for diarrhea or loose stools. After each loose stool   ondansetron 8 MG tablet Commonly known as:  ZOFRAN Take 8 mg by mouth every 8 (eight) hours as needed for nausea or vomiting.   PRESERVISION AREDS Caps Take 1 capsule by mouth 2 (two) times daily.   tiZANidine 4 MG tablet Commonly known as:  ZANAFLEX Take 4 mg by mouth 2 (two) times daily.   tobramycin 0.3 % ophthalmic solution Commonly known as:  TOBREX Place 1 drop into the right eye 4 (four) times daily as needed (the day prior, the day of, and the day after treatment.).   traMADol 50 MG tablet Commonly known as:  ULTRAM Take 0.5 tablets (25 mg total) by mouth 3 (three) times daily as needed for moderate pain. What changed:  when to take this  reasons to take this   traZODone 100 MG tablet Commonly known as:  DESYREL Take 200 mg by mouth at bedtime.   trolamine salicylate  10 % cream Commonly known as:  ASPERCREME Apply 1 application topically every 8 (eight) hours as needed for muscle pain.      Follow-up Information    [facility] .   Specialty:  Home Health Services Why:  [facility] physical therapy Contact information: [place] [id number] [phone]        Leonard Downing, MD. Schedule an appointment as soon as possible for a visit in 2 week(s).   Specialty:  Family Medicine Contact information: [place] [address] [phone]          Allergies  Allergen Reactions  . Codeine     Pass out, fall  . Cortisone     Pass out, fall   . Other     All narcotics cause patient to Pass out, fall    Procedures/Studies: Dg Ercp Biliary & Pancreatic Ducts  Result Date: 11/25/2015 CLINICAL DATA:  Bile duct stricture. EXAM: ERCP TECHNIQUE: Multiple spot images obtained with the fluoroscopic device and submitted for interpretation post-procedure. FLUOROSCOPY TIME:  Fluoroscopy Time:  1 minutes and 28 seconds Radiation Exposure Index (if provided by the fluoroscopic device): 17.39 mGy Number of  Acquired Spot Images: 2 COMPARISON:  10/22/2015 FINDINGS: Dilatation of the common bile duct is similar to the previous examination. No large filling defects. Wire advanced into the central biliary ducts. IMPRESSION: Dilatation of the biliary system is similar to the recent comparison examination. These images were submitted for radiologic interpretation only. Please see the procedural report for the amount of contrast and the fluoroscopy time utilized. Electronically Signed   By: Markus Daft M.D.   On: 11/25/2015 12:25   Dg Abd Acute W/chest  Result Date: 12/13/2015 CLINICAL DATA:  Vomiting since 1400 hours EXAM: DG ABDOMEN ACUTE W/ 1V CHEST COMPARISON:  10/28/2015 chest radiograph, CT from 10/22/2015 FINDINGS: The heart is top-normal in size. Streaky atelectasis noted at each lung base. There is mild  pulmonary vascular congestion with slightly low lung volumes bilaterally. Slight blunting of left costophrenic angle from small left pleural effusion is suspected. The patient is status post T11 and T12 kyphoplasty. Surgical clips are seen in the right upper quadrant. Supine and decubitus views of the abdomen demonstrate findings of bowel obstruction. Moderate stool burden within large bowel. There is no free intraperitoneal air. Three rounded calcified densities in the right hemipelvis are in keeping with known phleboliths. IMPRESSION: Negative abdominal radiographs.  No acute cardiopulmonary disease. Electronically Signed   By: Ashley Royalty M.D.   On: 12/13/2015 23:02     Subjective: Pt without complaints, feels much better, ready for discharge  Discharge Exam: Vitals:   12/15/15 2155 12/16/15 0635  BP: (!) 134/54 140/64  Pulse: 65 (!) 56  Resp: 18 18  Temp: 98.4 F (36.9 C) 98.5 F (36.9 C)   Vitals:   12/15/15 0609 12/15/15 1403 12/15/15 2155 12/16/15 0635  BP: (!) 143/61 (!) 142/73 (!) 134/54 140/64  Pulse: (!) 59 67 65 (!) 56  Resp: 18 19 18 18   Temp: 98.3 F (36.8 C) 98 F (36.7 C) 98.4 F (36.9 C) 98.5 F (36.9 C)  TempSrc: Oral Oral Oral Oral  SpO2: 94% 97% 94% 93%  Weight: 64 kg (141 lb 1.5 oz)     Height:       General: Pt is alert, awake, not in acute distress Cardiovascular: RRR, S1/S2 +, no rubs, no gallops Respiratory: CTA bilaterally, no wheezing, no rhonchi Abdominal: Soft, NT, ND, bowel sounds + Extremities: no edema, no cyanosis  The results of significant diagnostics from this hospitalization (including imaging, microbiology, ancillary and laboratory) are listed below for reference.     Microbiology: Recent Results (from the past 240 hour(s))  MRSA PCR Screening     Status: None   Collection Time: 12/14/15  3:51 AM  Result Value Ref Range Status   MRSA by PCR NEGATIVE NEGATIVE Final    Comment:        The GeneXpert MRSA Assay (FDA approved for NASAL  specimens only), is one component of a comprehensive MRSA colonization surveillance program. It is not intended to diagnose MRSA infection nor to guide or monitor treatment for MRSA infections.      Labs: BNP (last 3 results) No results for input(s): BNP in the last 8760 hours. Basic Metabolic Panel:  Recent Labs Lab 12/14/15 1053 12/14/15 1551 12/14/15 2150 12/15/15 0405 12/15/15 0952  NA 125* 132* 135 138 137  K 3.8 3.2* 3.4* 3.3* 3.7  CL 90* 96* 101 104 102  CO2 26 27 28 29 27   GLUCOSE 148* 150* 133* 135* 124*  BUN 6 6 7  5* 5*  CREATININE 0.58 0.70 0.67 0.68 0.76  CALCIUM 8.7* 8.8* 8.5* 8.7* 9.0  MG  --   --   --   --  1.9   Liver Function Tests:  Recent Labs Lab 12/14/15 0525  AST 14*  ALT 8*  ALKPHOS 79  BILITOT 1.1  PROT 5.6*  ALBUMIN 3.4*    Recent Labs Lab 12/13/15 2249  LIPASE 23   No results for input(s): AMMONIA in the last 168 hours. CBC:  Recent Labs Lab 12/13/15 2249 12/14/15 0525  WBC 7.4 5.9  NEUTROABS 6.4  --   HGB 14.8 14.6  HCT 39.8 39.6  MCV 78.2 77.8*  PLT 207 211   Cardiac Enzymes:  Recent Labs Lab 12/14/15 0525  TROPONINI <0.03   BNP: Invalid input(s): POCBNP CBG: No results for input(s): GLUCAP in the last 168 hours. D-Dimer No results for input(s): DDIMER in the last 72 hours. Hgb A1c No results for input(s): HGBA1C in the last 72 hours. Lipid Profile No results for input(s): CHOL, HDL, LDLCALC, TRIG, CHOLHDL, LDLDIRECT in the last 72 hours. Thyroid function studies  Recent Labs  12/14/15 0045  TSH 2.584   Anemia work up No results for input(s): VITAMINB12, FOLATE, FERRITIN, TIBC, IRON, RETICCTPCT in the last 72 hours. Urinalysis    Component Value Date/Time   COLORURINE YELLOW 12/13/2015 2354   APPEARANCEUR CLEAR 12/13/2015 2354   LABSPEC 1.016 12/13/2015 2354   PHURINE 7.0 12/13/2015 2354   GLUCOSEU NEGATIVE 12/13/2015 2354   HGBUR NEGATIVE 12/13/2015 2354   BILIRUBINUR NEGATIVE 12/13/2015  2354   KETONESUR >80 (A) 12/13/2015 2354   PROTEINUR NEGATIVE 12/13/2015 2354   UROBILINOGEN 0.2 11/21/2014 0501   NITRITE NEGATIVE 12/13/2015 2354   LEUKOCYTESUR NEGATIVE 12/13/2015 2354   Sepsis Labs Invalid input(s): PROCALCITONIN,  WBC,  LACTICIDVEN Microbiology Recent Results (from the past 240 hour(s))  MRSA PCR Screening     Status: None   Collection Time: 12/14/15  3:51 AM  Result Value Ref Range Status   MRSA by PCR NEGATIVE NEGATIVE Final    Comment:        The GeneXpert MRSA Assay (FDA approved for NASAL specimens only), is one component of a comprehensive MRSA colonization surveillance program. It is not intended to diagnose MRSA infection nor to guide or monitor treatment for MRSA infections.    Time coordinating discharge: 33 minutes  SIGNED:  Irwin Brakeman, MD  [facility] 12/16/2015, 11:22 AM  If 7PM-7AM, please contact night-coverage [email or web address] Password [email or web address]

## 2016-01-12 ENCOUNTER — Ambulatory Visit: Payer: Medicare Other | Admitting: Gastroenterology

## 2016-01-21 ENCOUNTER — Ambulatory Visit (INDEPENDENT_AMBULATORY_CARE_PROVIDER_SITE_OTHER): Payer: Medicare Other | Admitting: Gastroenterology

## 2016-01-21 ENCOUNTER — Encounter: Payer: Self-pay | Admitting: Gastroenterology

## 2016-01-21 ENCOUNTER — Other Ambulatory Visit (INDEPENDENT_AMBULATORY_CARE_PROVIDER_SITE_OTHER): Payer: Medicare Other

## 2016-01-21 ENCOUNTER — Encounter (INDEPENDENT_AMBULATORY_CARE_PROVIDER_SITE_OTHER): Payer: Self-pay

## 2016-01-21 VITALS — BP 140/70 | HR 76 | Ht 59.0 in | Wt 145.2 lb

## 2016-01-21 DIAGNOSIS — K315 Obstruction of duodenum: Secondary | ICD-10-CM | POA: Diagnosis not present

## 2016-01-21 DIAGNOSIS — K30 Functional dyspepsia: Secondary | ICD-10-CM

## 2016-01-21 LAB — COMPREHENSIVE METABOLIC PANEL
ALBUMIN: 4.2 g/dL (ref 3.5–5.2)
ALK PHOS: 95 U/L (ref 39–117)
ALT: 6 U/L (ref 0–35)
AST: 12 U/L (ref 0–37)
BILIRUBIN TOTAL: 0.4 mg/dL (ref 0.2–1.2)
BUN: 11 mg/dL (ref 6–23)
CO2: 33 mEq/L — ABNORMAL HIGH (ref 19–32)
Calcium: 9.5 mg/dL (ref 8.4–10.5)
Chloride: 98 mEq/L (ref 96–112)
Creatinine, Ser: 0.74 mg/dL (ref 0.40–1.20)
GFR: 80.44 mL/min (ref >60.00)
Glucose, Bld: 103 mg/dL — ABNORMAL HIGH (ref 70–99)
POTASSIUM: 4.2 meq/L (ref 3.5–5.1)
Sodium: 137 mEq/L (ref 135–145)
TOTAL PROTEIN: 6.8 g/dL (ref 6.0–8.3)

## 2016-01-21 NOTE — Progress Notes (Signed)
Review of pertinent gastrointestinal problems: 1.  Common bile duct dilation (etiology unknown... Overall most suspicious for papillary stenosis) seen on CT scan 10/2015; presented with abd pain, elevated liver tests (T bili 8.43max) and CT scan showed intra/extra hepatic biliary dilation and  'suggestion of" a mass in pancreatic head. ERCP Dr. 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 MRI 2 days later showed NO pancreatic mass. EUS and repeat ERCP 11/2015 Dr. Ardis Hughs: previously placed metal stent was removed, no pancreatic or overt biliary masses noted, bile duct cannulated, CBD tapered smoothly and was only mildly dilated, biliary sphincterotomy performed and distal CBD mucosa was biopsied; stent cytology and bile duct biopsies both negative for neoplasm.  LFTs 11/2015 were normal.     HPI: This is a very pleasant 81 year old woman whom I last saw the time of a repeat ERCP and EUS about 6 weeks ago.  Chief complaint is previous bile duct blockage, current indigestion   She is bothered by indigestion, nausea.  No overt jaundice.  Has had a pain in her upper mid back.  These GI symptoms can occur anytime of day.  She takes tums and pepto and these usually help her GI upset  She has had no jaundice and no significant abdominal pains   ROS: complete GI ROS as described in HPI.  Constitutional:  No unintentional weight loss   Past Medical History:  Diagnosis Date  . Anxiety   . Arthritis   . Depression   . Falls frequently   . Fibromyalgia   . Fracture, clavicle   . GERD (gastroesophageal reflux disease)   . History of fractured vertebra   . Hypertension   . Hypothyroidism   . Seasonal allergies   . Thyroid disease   . Vertigo   . Vitamin A deficiency   . Wrist fracture, right     Past Surgical History:  Procedure Laterality Date  . ABDOMINAL HYSTERECTOMY    . BACK SURGERY    .  CHOLECYSTECTOMY    . ERCP N/A 10/22/2015   Procedure: ENDOSCOPIC RETROGRADE CHOLANGIOPANCREATOGRAPHY (ERCP);  Surgeon: Milus Banister, MD;  Location: [facility];  Service: Endoscopy;  Laterality: N/A;  . ERCP N/A 11/25/2015   Procedure: ENDOSCOPIC RETROGRADE CHOLANGIOPANCREATOGRAPHY (ERCP);  Surgeon: Milus Banister, MD;  Location: [facility];  Service: Endoscopy;  Laterality: N/A;  . EUS N/A 11/25/2015   Procedure: ESOPHAGEAL ENDOSCOPIC ULTRASOUND (EUS) RADIAL;  Surgeon: Milus Banister, MD;  Location: [facility];  Service: Endoscopy;  Laterality: N/A;  . FRACTURE SURGERY    . THYROIDECTOMY    . WRIST SURGERY      Current Outpatient Prescriptions  Medication Sig Dispense Refill  . Alcaftadine (LASTACAFT) 0.25 % SOLN Place 1 drop into both eyes at bedtime.    Marland Kitchen antiseptic oral rinse (BIOTENE) LIQD Swish and spit 15 mLs 3 (three) times daily.     . benzocaine-resorcinol (VAGISIL) 5-2 % vaginal cream Place 1 application vaginally as needed for itching.    . bismuth subsalicylate (PEPTO BISMOL) 262 MG/15ML suspension Take 30 mLs by mouth every 4 (four) hours as needed for diarrhea or loose stools.    . calcium-vitamin D (OSCAL WITH D) 500-200 MG-UNIT tablet Take 1 tablet by mouth daily.    . cetirizine (ZYRTEC) 10 MG tablet Take 10 mg by mouth at bedtime.     . chlorhexidine (PERIDEX) 0.12 % solution Swish and spit 15 mLs 2 (two)  times daily.    Marland Kitchen dextromethorphan (DELSYM) 30 MG/5ML liquid Take 10 mLs by mouth every 12 (twelve) hours as needed for cough.    . diclofenac sodium (VOLTAREN) 1 % GEL Apply topically every 6 (six) hours as needed (pain).     . fluticasone (FLONASE) 50 MCG/ACT nasal spray Place 2 sprays into both nostrils daily.    . hydrocortisone cream 1 % Apply 1 application topically every 6 (six) hours as needed for itching.    . hydrOXYzine (VISTARIL) 25 MG capsule Take 25 mg by mouth 3 (three) times daily.     Candace Gallus Care Products (JOHNSONS BABY SHAMPOO EX) Apply  topically. Dilute a pea size amount with water; close right eye, wash and rinse. Repeat twice daily for 2 days prior to injection.    Marland Kitchen levothyroxine (SYNTHROID, LEVOTHROID) 112 MCG tablet Take 112 mcg by mouth daily before breakfast.    . loperamide (IMODIUM) 2 MG capsule Take 2 mg by mouth daily as needed for diarrhea or loose stools. After each loose stool    . Multiple Vitamins-Minerals (PRESERVISION AREDS) CAPS Take 1 capsule by mouth 2 (two) times daily.    . ondansetron (ZOFRAN) 8 MG tablet Take 8 mg by mouth every 8 (eight) hours as needed for nausea or vomiting.    Marland Kitchen tiZANidine (ZANAFLEX) 4 MG tablet Take 4 mg by mouth 2 (two) times daily.    Marland Kitchen tobramycin (TOBREX) 0.3 % ophthalmic solution Place 1 drop into the right eye 4 (four) times daily as needed (the day prior, the day of, and the day after treatment.).     Marland Kitchen traMADol (ULTRAM) 50 MG tablet Take 0.5 tablets (25 mg total) by mouth 3 (three) times daily as needed for moderate pain. 15 tablet 0  . traZODone (DESYREL) 100 MG tablet Take 200 mg by mouth at bedtime.     . trolamine salicylate (ASPERCREME) 10 % cream Apply 1 application topically every 8 (eight) hours as needed for muscle pain.      No current facility-administered medications for this visit.     Allergies as of 01/21/2016 - Review Complete 01/21/2016  Allergen Reaction Noted  . Codeine  07/20/2010  . Cortisone  07/20/2010  . Other  02/16/2013    Family History  Problem Relation Age of Onset  . Hypertension Other     Social History   Social History  . Marital status: Divorced    Spouse name: N/A  . Number of children: N/A  . Years of education: N/A   Occupational History  . Not on file.   Social History Main Topics  . Smoking status: Never Smoker  . Smokeless tobacco: Never Used  . Alcohol use No  . Drug use: No  . Sexual activity: Not on file   Other Topics Concern  . Not on file   Social History Narrative  . No narrative on file      Physical Exam: BP 140/70 (BP Location: Left Arm, Patient Position: Sitting, Cuff Size: Normal)   Pulse 76   Ht 4\' 11"  (1.499 m) Comment: height measured without shoes  Wt 145 lb 4 oz (65.9 kg)   BMI 29.34 kg/m  Constitutional: Chronically ill-appearing, sitting in wheelchair Psychiatric: alert and oriented x3 Abdomen: soft, nontender, nondistended, no obvious ascites, no peritoneal signs, normal bowel sounds No peripheral edema noted in lower extremities  Assessment and plan: 81 y.o. female with mild indigestion, previous bile duct obstruction  After the extensive biliary workup described above  by think that she simply had papillary stenosis. No imaging tests or biopsies have over proven underlying cancer and she has no masses noted on any examinations. She is going to get a repeat set of blood work today including a complete about profile and if it is normal I would probably just repeat this again in 3 or 4 months. She's having some mild dyspepsia, indigestion. Pepto-Bismol and Tums helps her and she is never been on proton pump inhibitor that I can tell and minimus start her on omeprazole one pill once daily.   Owens Loffler, MD [facility] 01/21/2016, 3:47 PM

## 2016-01-21 NOTE — Patient Instructions (Addendum)
You will have labs checked today in the basement lab.  Please head down after you check out with the front desk  (cmet). Will likely want repeat cmet in 2-3 months depending on the results of todays labs. Start new prescription; omeprazole 40mg  pill, take one before breakfast every morning.

## 2016-01-24 ENCOUNTER — Other Ambulatory Visit: Payer: Self-pay

## 2016-01-24 DIAGNOSIS — R945 Abnormal results of liver function studies: Secondary | ICD-10-CM

## 2016-01-24 DIAGNOSIS — R7989 Other specified abnormal findings of blood chemistry: Secondary | ICD-10-CM

## 2016-01-25 ENCOUNTER — Telehealth: Payer: Self-pay | Admitting: Gastroenterology

## 2016-01-25 MED ORDER — OMEPRAZOLE 40 MG PO CPDR: 40.0000 mg | DELAYED_RELEASE_CAPSULE | Freq: Every day | ORAL | 3 refills | Status: DC

## 2016-01-25 MED ORDER — OMEPRAZOLE 40 MG PO CPDR
40.0000 mg | DELAYED_RELEASE_CAPSULE | Freq: Every day | ORAL | 3 refills | Status: DC
Start: 2016-01-25 — End: 2018-12-08

## 2016-01-25 NOTE — Telephone Encounter (Signed)
rx sent.  She also asked that I send a hard copy to her work

## 2016-06-02 ENCOUNTER — Emergency Department (HOSPITAL_COMMUNITY)
Admission: EM | Admit: 2016-06-02 | Discharge: 2016-06-02 | Disposition: A | Discharge status: Home / Self Care | Payer: Medicare Other | Attending: Emergency Medicine | Admitting: Emergency Medicine

## 2016-06-02 ENCOUNTER — Encounter (HOSPITAL_COMMUNITY): Payer: Self-pay | Admitting: *Deleted

## 2016-06-02 ENCOUNTER — Emergency Department (HOSPITAL_COMMUNITY): Payer: Medicare Other

## 2016-06-02 DIAGNOSIS — E039 Hypothyroidism, unspecified: Secondary | ICD-10-CM | POA: Diagnosis not present

## 2016-06-02 DIAGNOSIS — I10 Essential (primary) hypertension: Secondary | ICD-10-CM | POA: Insufficient documentation

## 2016-06-02 DIAGNOSIS — G8929 Other chronic pain: Secondary | ICD-10-CM | POA: Insufficient documentation

## 2016-06-02 DIAGNOSIS — Z79899 Other long term (current) drug therapy: Secondary | ICD-10-CM | POA: Diagnosis not present

## 2016-06-02 DIAGNOSIS — R55 Syncope and collapse: Secondary | ICD-10-CM | POA: Insufficient documentation

## 2016-06-02 DIAGNOSIS — R109 Unspecified abdominal pain: Secondary | ICD-10-CM | POA: Diagnosis not present

## 2016-06-02 LAB — CBC WITH DIFFERENTIAL/PLATELET
BASOS PCT: 1 %
Basophils Absolute: 0 10*3/uL (ref 0.0–0.1)
EOS PCT: 1 %
Eosinophils Absolute: 0 10*3/uL (ref 0.0–0.7)
HCT: 38.8 % (ref 36.0–46.0)
Hemoglobin: 13.6 g/dL (ref 12.0–15.0)
Lymphocytes Relative: 20 %
Lymphs Abs: 0.9 10*3/uL (ref 0.7–4.0)
MCH: 29.9 pg (ref 26.0–34.0)
MCHC: 35.1 g/dL (ref 30.0–36.0)
MCV: 85.3 fL (ref 78.0–100.0)
MONO ABS: 0.4 10*3/uL (ref 0.1–1.0)
Monocytes Relative: 8 %
Neutro Abs: 3.4 10*3/uL (ref 1.7–7.7)
Neutrophils Relative %: 70 %
PLATELETS: 150 10*3/uL (ref 150–400)
RBC: 4.55 MIL/uL (ref 3.87–5.11)
RDW: 13.7 % (ref 11.5–15.5)
WBC: 4.8 10*3/uL (ref 4.0–10.5)

## 2016-06-02 LAB — COMPREHENSIVE METABOLIC PANEL
ALBUMIN: 3.9 g/dL (ref 3.5–5.0)
ALT: 11 U/L — AB (ref 14–54)
AST: 19 U/L (ref 15–41)
Alkaline Phosphatase: 56 U/L (ref 38–126)
Anion gap: 11 (ref 5–15)
BUN: 9 mg/dL (ref 6–20)
CHLORIDE: 98 mmol/L — AB (ref 101–111)
CO2: 28 mmol/L (ref 22–32)
Calcium: 9.3 mg/dL (ref 8.9–10.3)
Creatinine, Ser: 1.01 mg/dL — ABNORMAL HIGH (ref 0.44–1.00)
GFR calc Af Amer: 60 mL/min — ABNORMAL LOW (ref >60)
GFR calc non Af Amer: 52 mL/min — ABNORMAL LOW (ref >60)
GLUCOSE: 117 mg/dL — AB (ref 65–99)
POTASSIUM: 4.3 mmol/L (ref 3.5–5.1)
SODIUM: 137 mmol/L (ref 135–145)
Total Bilirubin: 0.8 mg/dL (ref 0.3–1.2)
Total Protein: 6 g/dL — ABNORMAL LOW (ref 6.5–8.1)

## 2016-06-02 LAB — TSH: TSH: 2.665 u[IU]/mL (ref 0.350–4.500)

## 2016-06-02 LAB — I-STAT TROPONIN, ED: Troponin i, poc: 0 ng/mL (ref 0.00–0.08)

## 2016-06-02 LAB — LIPASE, BLOOD: LIPASE: 16 U/L (ref 11–51)

## 2016-06-02 MED ORDER — SODIUM CHLORIDE 0.9 % IV BOLUS (SEPSIS)
500.0000 mL | Freq: Once | INTRAVENOUS | Status: AC
  Administered 2016-06-02: 500 mL via INTRAVENOUS

## 2016-06-02 MED ORDER — ONDANSETRON HCL 4 MG/2ML IJ SOLN
4.0000 mg | Freq: Once | INTRAMUSCULAR | Status: AC
  Administered 2016-06-02: 4 mg via INTRAVENOUS
  Filled 2016-06-02: qty 2

## 2016-06-02 NOTE — ED Triage Notes (Signed)
Pt arrived by ems from [facility], pt was sitting at breakfast and "slumped over". Per ems, pts HR was initially in 40's and pt was hypotensive and slow to respond. Given 54ml bolus and pt is now fatigued but responding to verbal stimuli, HR 48.

## 2016-06-02 NOTE — ED Notes (Signed)
Pt ambulated with minimal assistance. spo2 remained 94-96% on room air and HR 70-75/min.

## 2016-06-02 NOTE — ED Notes (Signed)
[facility] contacted to transport patient back to facility  

## 2016-06-02 NOTE — Discharge Instructions (Signed)
Follow up with your PCP, they may want to get an [place] of your heart or put you on a heart monitor for home.

## 2016-06-02 NOTE — ED Provider Notes (Signed)
[facility] DEPT [facility] Provider Note   CSN: [id number] Arrival date & time: 06/02/16  0913     History   Chief Complaint Chief Complaint  Patient presents with  . Bradycardia    HPI Erika Conley is a 82 y.o. female.  89 yoF with a chief complaint of a syncopal event. Patient was eating breakfast and suddenly slumped over at the table. She remembers that she felt bad but is unable to quantify further. Denies chest pain shortness breath headache neck pain. She feels quite a bit better at this point but still feels generally weak. Denies unilateral weakness or numbness. Denies recent infection. Denies vomiting or diarrhea.   The history is provided by the patient.  Illness  This is a new problem. The current episode started 1 to 2 hours ago. The problem occurs rarely. The problem has been resolved. Pertinent negatives include no chest pain, no headaches and no shortness of breath. Nothing aggravates the symptoms. Nothing relieves the symptoms. She has tried nothing for the symptoms. The treatment provided no relief.    Past Medical History:  Diagnosis Date  . Anxiety   . Arthritis   . Depression   . Falls frequently   . Fibromyalgia   . Fracture, clavicle   . GERD (gastroesophageal reflux disease)   . History of fractured vertebra   . Hypertension   . Hypothyroidism   . Seasonal allergies   . Thyroid disease   . Vertigo   . Vitamin A deficiency   . Wrist fracture, right     Patient Active Problem List   Diagnosis Date Noted  . Nausea and vomiting 12/14/2015  . Hyponatremia 12/13/2015  . Obstructive jaundice   . Bile duct stricture   . Pancreatitis 10/23/2015  . Abdominal pain 10/22/2015  . Pancreatic mass 10/22/2015  . Common bile duct (CBD) obstruction   . Abnormal LFTs   . Dehydration with hyponatremia 04/09/2015  . Acute respiratory failure with hypoxia ([place]) 04/09/2015  . Thrombocytopenia ([place]) 04/09/2015  . Hypokalemia 11/10/2014  . Hypertension  11/10/2014  . Hypothyroidism 11/10/2014  . Syncope due to orthostatic hypotension 11/10/2014  . Constipated   . UTI (urinary tract infection) 06/04/2013  . Altered mental status 06/04/2013    Past Surgical History:  Procedure Laterality Date  . ABDOMINAL HYSTERECTOMY    . BACK SURGERY    . CHOLECYSTECTOMY    . ERCP N/A 10/22/2015   Procedure: ENDOSCOPIC RETROGRADE CHOLANGIOPANCREATOGRAPHY (ERCP);  Surgeon: Milus Banister, MD;  Location: [facility];  Service: Endoscopy;  Laterality: N/A;  . ERCP N/A 11/25/2015   Procedure: ENDOSCOPIC RETROGRADE CHOLANGIOPANCREATOGRAPHY (ERCP);  Surgeon: Milus Banister, MD;  Location: [facility];  Service: Endoscopy;  Laterality: N/A;  . EUS N/A 11/25/2015   Procedure: ESOPHAGEAL ENDOSCOPIC ULTRASOUND (EUS) RADIAL;  Surgeon: Milus Banister, MD;  Location: [facility];  Service: Endoscopy;  Laterality: N/A;  . FRACTURE SURGERY    . THYROIDECTOMY    . WRIST SURGERY      OB History    No data available       Home Medications    Prior to Admission medications   Medication Sig Start Date End Date Taking? Authorizing Provider  acetaminophen (TYLENOL) 500 MG tablet Take 500 mg by mouth 2 (two) times daily.   Yes Historical Provider, MD  Alcaftadine (LASTACAFT) 0.25 % SOLN Place 1 drop into both eyes daily.    Yes Historical Provider, MD  antiseptic oral rinse (BIOTENE) LIQD Swish and spit 15  mLs 3 (three) times daily.    Yes Historical Provider, MD  azelastine (OPTIVAR) 0.05 % ophthalmic solution Place 1 drop into both eyes 2 (two) times daily.   Yes Historical Provider, MD  busPIRone (BUSPAR) 15 MG tablet Take 22.5 mg by mouth 2 (two) times daily.   Yes Historical Provider, MD  calcium-vitamin D (OSCAL WITH D) 500-200 MG-UNIT tablet Take 1 tablet by mouth daily.   Yes Historical Provider, MD  fluorometholone (FML) 0.1 % ophthalmic suspension Place 1 drop into both eyes as directed. Starting 06/01/16 instill 1 drop both eyes 4 times daily for 1  week, then 1 drop both eyes 3 times daily for 1 week, then 1 drop both eyes twice daily for 1 week   Yes Historical Provider, MD  fluticasone (FLONASE) 50 MCG/ACT nasal spray Place 1 spray into both nostrils 2 (two) times daily.    Yes Historical Provider, MD  HYDROcodone-acetaminophen (NORCO/VICODIN) 5-325 MG tablet Take 1 tablet by mouth 2 (two) times daily.    Yes Historical Provider, MD  hydrOXYzine (VISTARIL) 25 MG capsule Take 25 mg by mouth 3 (three) times daily.    Yes Historical Provider, MD  Infant Care Products (JOHNSONS BABY SHAMPOO EX) Apply topically. Dilute a pea size amount with water; close right eye, wash and rinse. Repeat twice daily for 2 days prior to injection.   Yes Historical Provider, MD  levothyroxine (SYNTHROID, LEVOTHROID) 88 MCG tablet Take 88 mcg by mouth daily before breakfast.    Yes Historical Provider, MD  loperamide (IMODIUM) 2 MG capsule Take 2 mg by mouth daily as needed for diarrhea or loose stools. After each loose stool   Yes Historical Provider, MD  loratadine (CLARITIN) 10 MG tablet Take 10 mg by mouth daily.   Yes Historical Provider, MD  LORazepam (ATIVAN) 0.5 MG tablet Take 0.5 mg by mouth daily at 12 noon.   Yes Historical Provider, MD  Multiple Vitamins-Minerals (CERTAVITE SENIOR/ANTIOXIDANT PO) Take 1 tablet by mouth daily.   Yes Historical Provider, MD  Multiple Vitamins-Minerals (PRESERVISION AREDS) CAPS Take 1 capsule by mouth 2 (two) times daily.   Yes Historical Provider, MD  omeprazole (PRILOSEC) 40 MG capsule Take 1 capsule (40 mg total) by mouth daily. 01/25/16  Yes Milus Banister, MD  ondansetron (ZOFRAN) 8 MG tablet Take 8 mg by mouth every 8 (eight) hours as needed for nausea or vomiting.   Yes Historical Provider, MD  sertraline (ZOLOFT) 50 MG tablet Take 75 mg by mouth daily.    Yes Historical Provider, MD  traZODone (DESYREL) 100 MG tablet Take 200 mg by mouth at bedtime.    Yes Historical Provider, MD    Family History Family History    Problem Relation Age of Onset  . Hypertension Other     Social History Social History  Substance Use Topics  . Smoking status: Never Smoker  . Smokeless tobacco: Never Used  . Alcohol use No     Allergies   Codeine; Cortisone; and Other   Review of Systems Review of Systems  Constitutional: Negative for chills and fever.  HENT: Negative for congestion and rhinorrhea.   Eyes: Negative for redness and visual disturbance.  Respiratory: Negative for shortness of breath and wheezing.   Cardiovascular: Negative for chest pain and palpitations.  Gastrointestinal: Negative for nausea and vomiting.  Genitourinary: Negative for dysuria and urgency.  Musculoskeletal: Negative for arthralgias and myalgias.  Skin: Negative for pallor and wound.  Neurological: Positive for syncope, weakness (generalized) and  light-headedness. Negative for dizziness and headaches.     Physical Exam Updated Vital Signs BP (!) 132/49 (BP Location: Right Arm)   Pulse 62   Temp 97.5 F (36.4 C) (Oral)   Resp 16   SpO2 95%   Physical Exam  Constitutional: She is oriented to person, place, and time. She appears well-developed and well-nourished. No distress.  HENT:  Head: Normocephalic and atraumatic.  Eyes: EOM are normal. Pupils are equal, round, and reactive to light.  Neck: Normal range of motion. Neck supple.  Cardiovascular: Normal rate and regular rhythm.  Exam reveals no gallop and no friction rub.   No murmur heard. Pulmonary/Chest: Effort normal. She has no wheezes. She has no rales.  Abdominal: Soft. She exhibits no distension and no mass. There is tenderness (diffuse, chronic per patient). There is no guarding.  Musculoskeletal: She exhibits no edema or tenderness.  Neurological: She is alert and oriented to person, place, and time.  Skin: Skin is warm and dry. She is not diaphoretic.  Psychiatric: She has a normal mood and affect. Her behavior is normal.  Nursing note and vitals  reviewed.    ED Treatments / Results  Labs (all labs ordered are listed, but only abnormal results are displayed) Labs Reviewed  COMPREHENSIVE METABOLIC PANEL - Abnormal; Notable for the following:       Result Value   Chloride 98 (*)    Glucose, Bld 117 (*)    Creatinine, Ser 1.01 (*)    Total Protein 6.0 (*)    ALT 11 (*)    GFR calc non Af Amer 52 (*)    GFR calc Af Amer 60 (*)    All other components within normal limits  CBC WITH DIFFERENTIAL/PLATELET  LIPASE, BLOOD  TSH  T4  I-STAT TROPOININ, ED    EKG  EKG Interpretation  Date/Time:  Friday June 02 2016 09:25:54 EDT Ventricular Rate:  53 PR Interval:    QRS Duration: 103 QT Interval:  634 QTC Calculation: 596 R Axis:   9 Text Interpretation:  Sinus bradycardia Low voltage, extremity and precordial leads Nonspecific T abnormalities, lateral leads Otherwise no significant change Confirmed by Corynne Scibilia MD, Quillian Quince ([id number]) on 06/02/2016 9:31:50 AM       Radiology Dg Chest 2 View  Result Date: 06/02/2016 CLINICAL DATA:  Patient reports she started to feel weak while eating breakfast this AM. HX HTN EXAM: CHEST  2 VIEW COMPARISON:  12/13/2015 FINDINGS: Lateral view degraded by patient arm position. Thoracolumbar vertebral augmentation for prior compression deformities. There iis a moderate compression deformity just cephalad to the augmentation. Configuration is similar to 04/10/2015. Midline trachea. Patient rotated minimally right on the frontal. Mild cardiomegaly. No pleural effusion or pneumothorax. No congestive failure. Mild left base scarring laterally. IMPRESSION: Cardiomegaly without congestive failure. Electronically Signed   By: Abigail Miyamoto M.D.   On: 06/02/2016 10:47    Procedures Procedures (including critical care time)  Medications Ordered in ED Medications  sodium chloride 0.9 % bolus 500 mL (0 mLs Intravenous Stopped 06/02/16 1049)  ondansetron (ZOFRAN) injection 4 mg (4 mg Intravenous Given 06/02/16  1011)     Initial Impression / Assessment and Plan / ED Course  I have reviewed the triage vital signs and the nursing notes.  Pertinent labs & imaging results that were available during my care of the patient were reviewed by me and considered in my medical decision making (see chart for details).     82 yo F With a  chief complaint of a syncopal event. No history of heart failure.  Mild elevation of creatinine.  Patient feeling much better after a L of fluids.  Discussed inpatient vs outpatient workup.  Patient electing for d/c at this time.  PCP follow up in a week for creatinine recheck.   :  I have discussed the diagnosis/risks/treatment options with the patient and caregiver and believe the pt to be eligible for discharge home to follow-up with PCP. We also discussed returning to the ED immediately if new or worsening sx occur. We discussed the sx which are most concerning (e.g., sudden worsening pain, fever, inability to tolerate by mouth) that necessitate immediate return. Medications administered to the patient during their visit and any new prescriptions provided to the patient are listed below.  Medications given during this visit Medications  sodium chloride 0.9 % bolus 500 mL (0 mLs Intravenous Stopped 06/02/16 1049)  ondansetron (ZOFRAN) injection 4 mg (4 mg Intravenous Given 06/02/16 1011)     The patient appears reasonably screen and/or stabilized for discharge and I doubt any other medical condition or other [facility] requiring further screening, evaluation, or treatment in the ED at this time prior to discharge.    Final Clinical Impressions(s) / ED Diagnoses   Final diagnoses:  Syncope and collapse    New Prescriptions Discharge Medication List as of 06/02/2016 12:50 PM       Deno Etienne, DO 06/04/16 [id number]

## 2016-06-02 NOTE — ED Notes (Signed)
Patient transported to X-ray 

## 2016-06-03 LAB — T4: T4 TOTAL: 6.4 ug/dL (ref 4.5–12.0)

## 2016-09-12 IMAGING — CT CT HEAD W/O CM
2 series · 17 of 30 positions shown, 20 images · non-contrast
Comparison: February 09, 2015

CLINICAL DATA: Syncope with fall

EXAM:
CT HEAD WITHOUT CONTRAST
TECHNIQUE: Contiguous axial images were obtained from the base of the skull
through the vertex without intravenous contrast.

[Series 2: head w/o · axial · non-contrast · 0.45mm/px · z∈[-101,+19]mm · 9 of 31 slices shown, 12 images]
[im 4/31  brain]
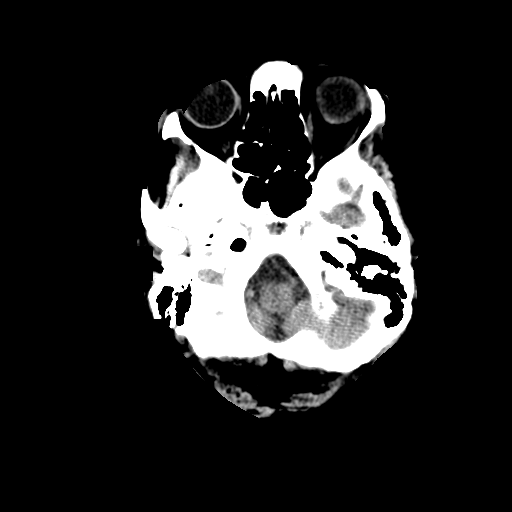
[im 4/31  bone]
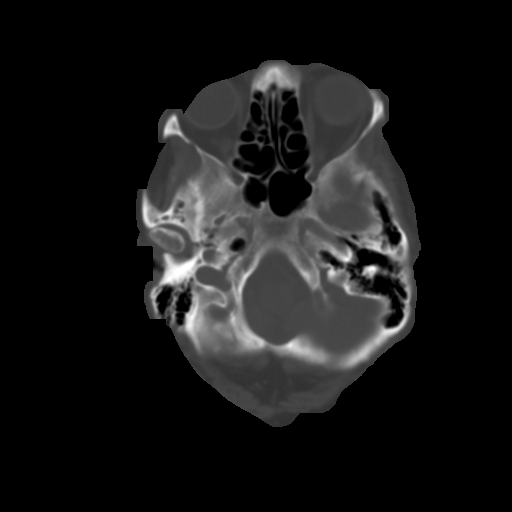
[im 7/31  brain]
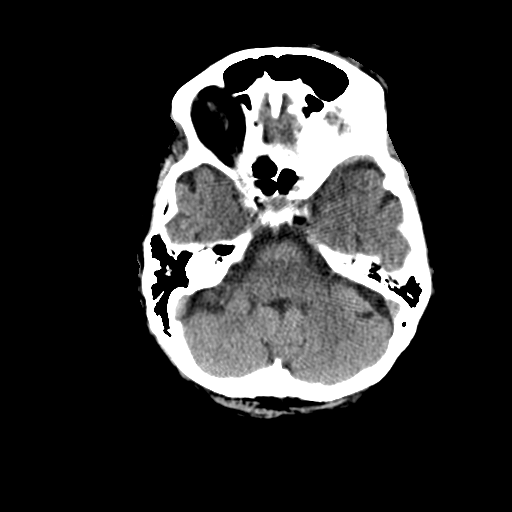
[im 10/31  brain]
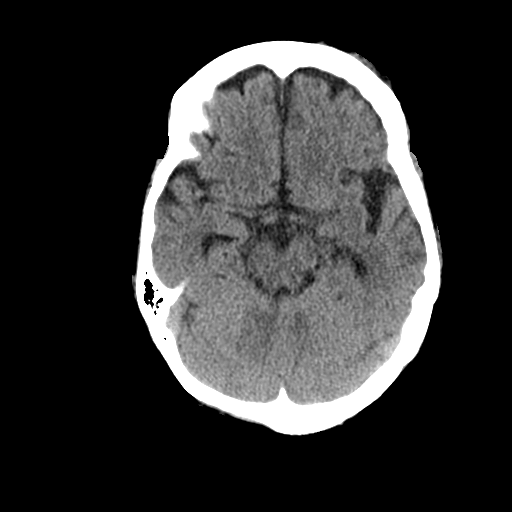
[im 13/31  brain]
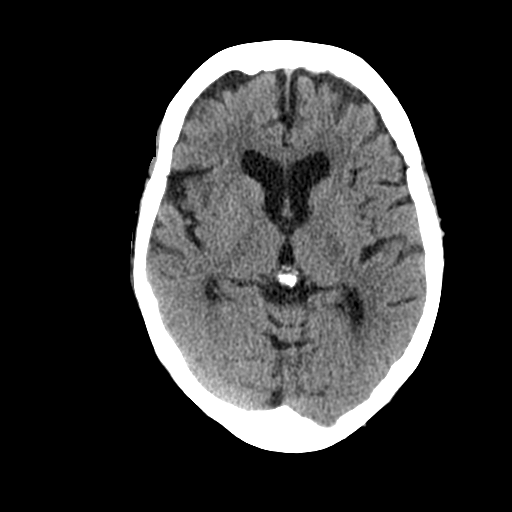
[im 16/31  brain]
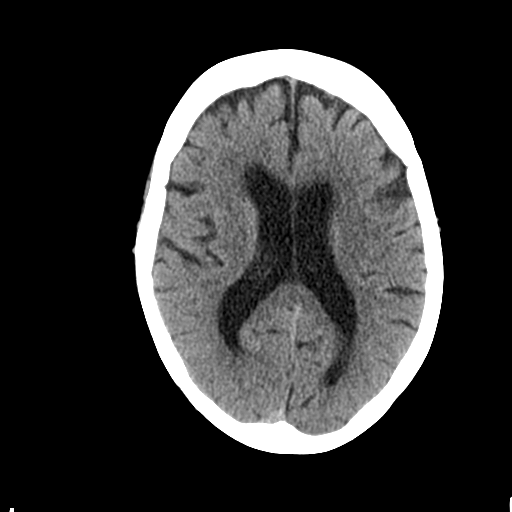
[im 16/31  bone]
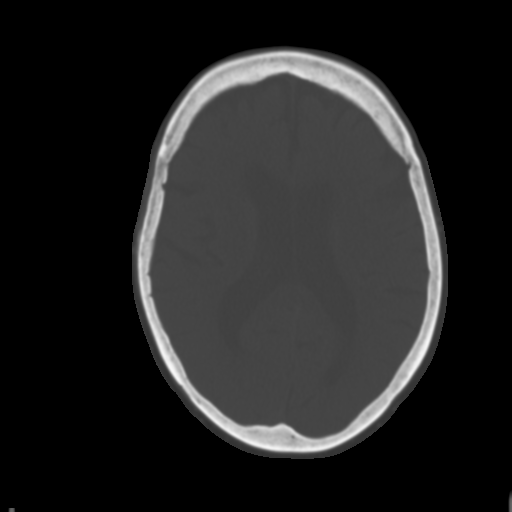
[im 19/31  brain]
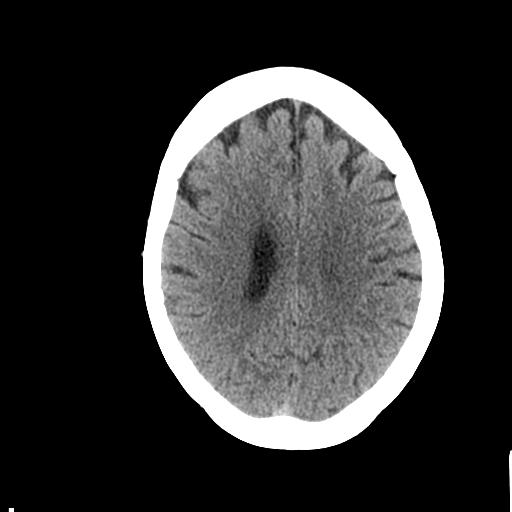
[im 22/31  brain]
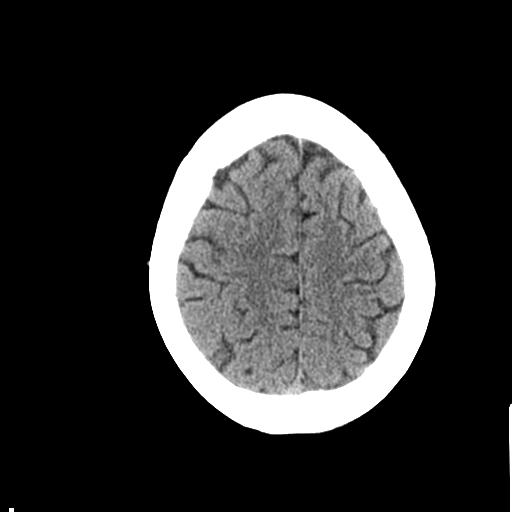
[im 25/31  brain]
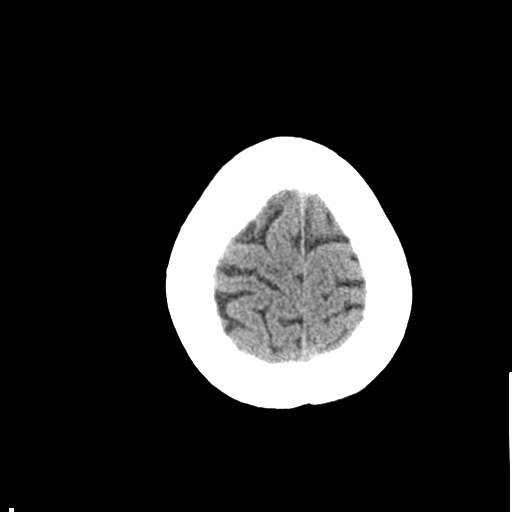
[im 28/31  brain]
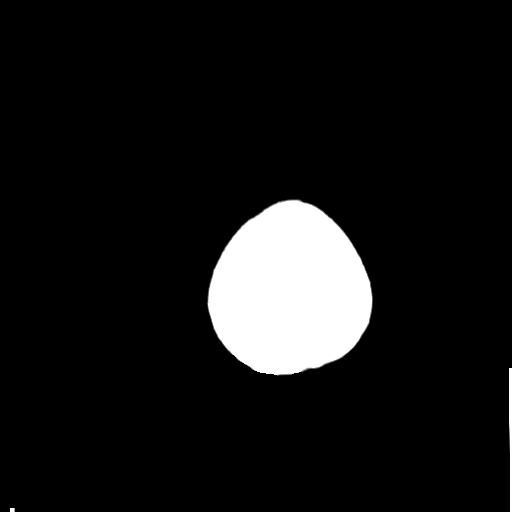
[im 28/31  bone]
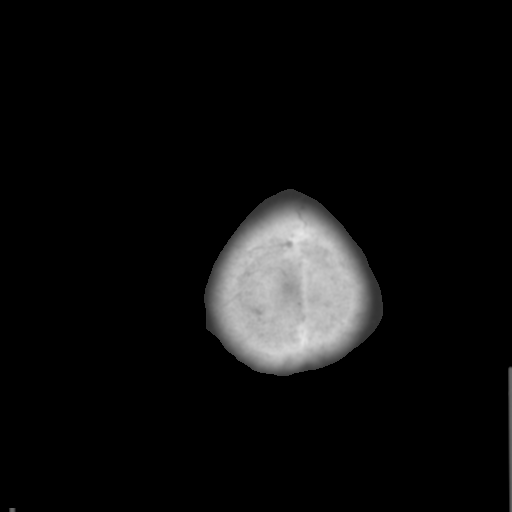

[Series 3: bone windows · axial · 0.45mm/px · z∈[-101,+16]mm · 8 of 51 slices shown]
[im 6/51  bone]
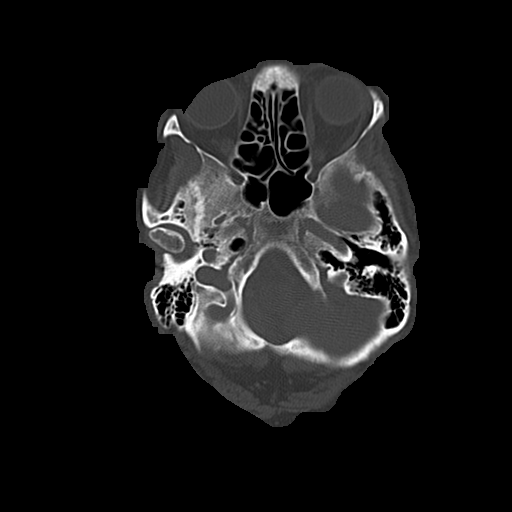
[im 12/51  bone]
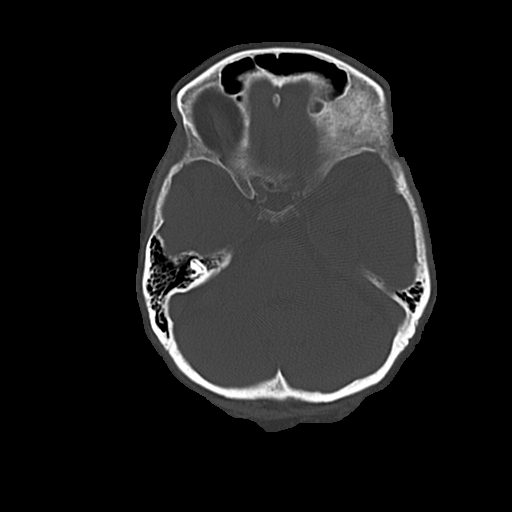
[im 17/51  bone]
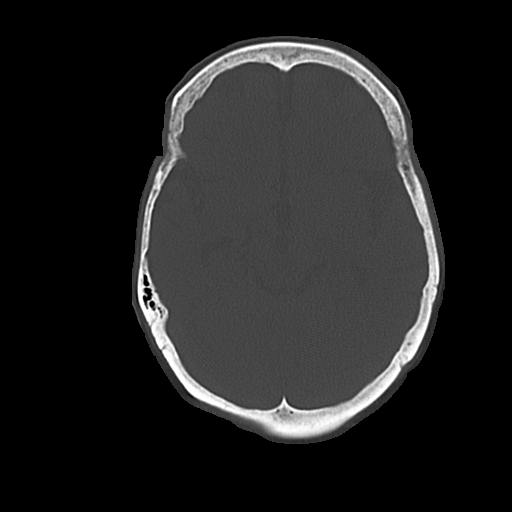
[im 23/51  bone]
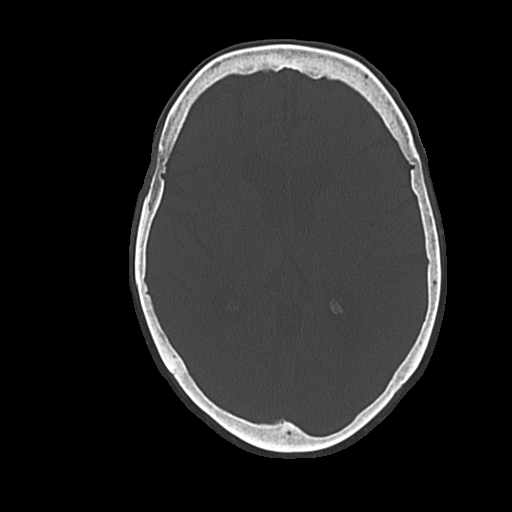
[im 28/51  bone]
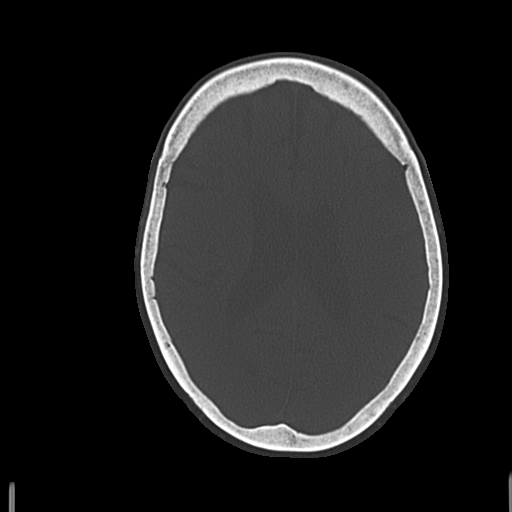
[im 34/51  bone]
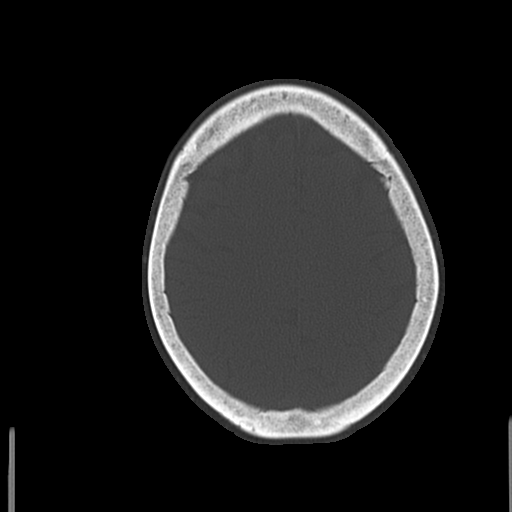
[im 39/51  bone]
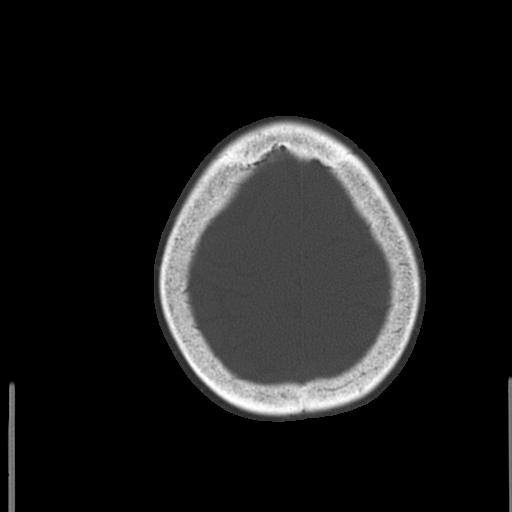
[im 45/51  bone]
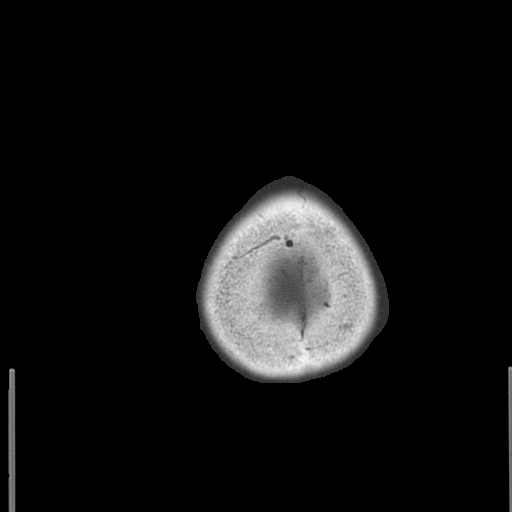

[17 of 30 positions shown; findings below may reference images not displayed]

FINDINGS: Slight generalized atrophy is stable. There is no intracranial mass,
hemorrhage, extra-axial fluid collection, or midline shift. There is
mild small vessel disease in the centra semiovale bilaterally.
Elsewhere gray-white compartments appear normal. No acute infarct
evident. The bony calvarium appears intact. The mastoid air cells
are clear. No intraorbital lesions are identified.
IMPRESSION: Mild atrophy with slight periventricular small vessel disease. No
intracranial mass, hemorrhage, or extra-axial fluid collection. No
acute infarct evident.

## 2016-09-12 IMAGING — CR DG CHEST 2V
2 series · 2 of 2 positions shown · non-contrast
Comparison: 01/29/2015

CLINICAL DATA: Recent fall with elbow pain and right shoulder pain,
initial encounter

EXAM:
CHEST - 2 VIEW

[x chest ap]
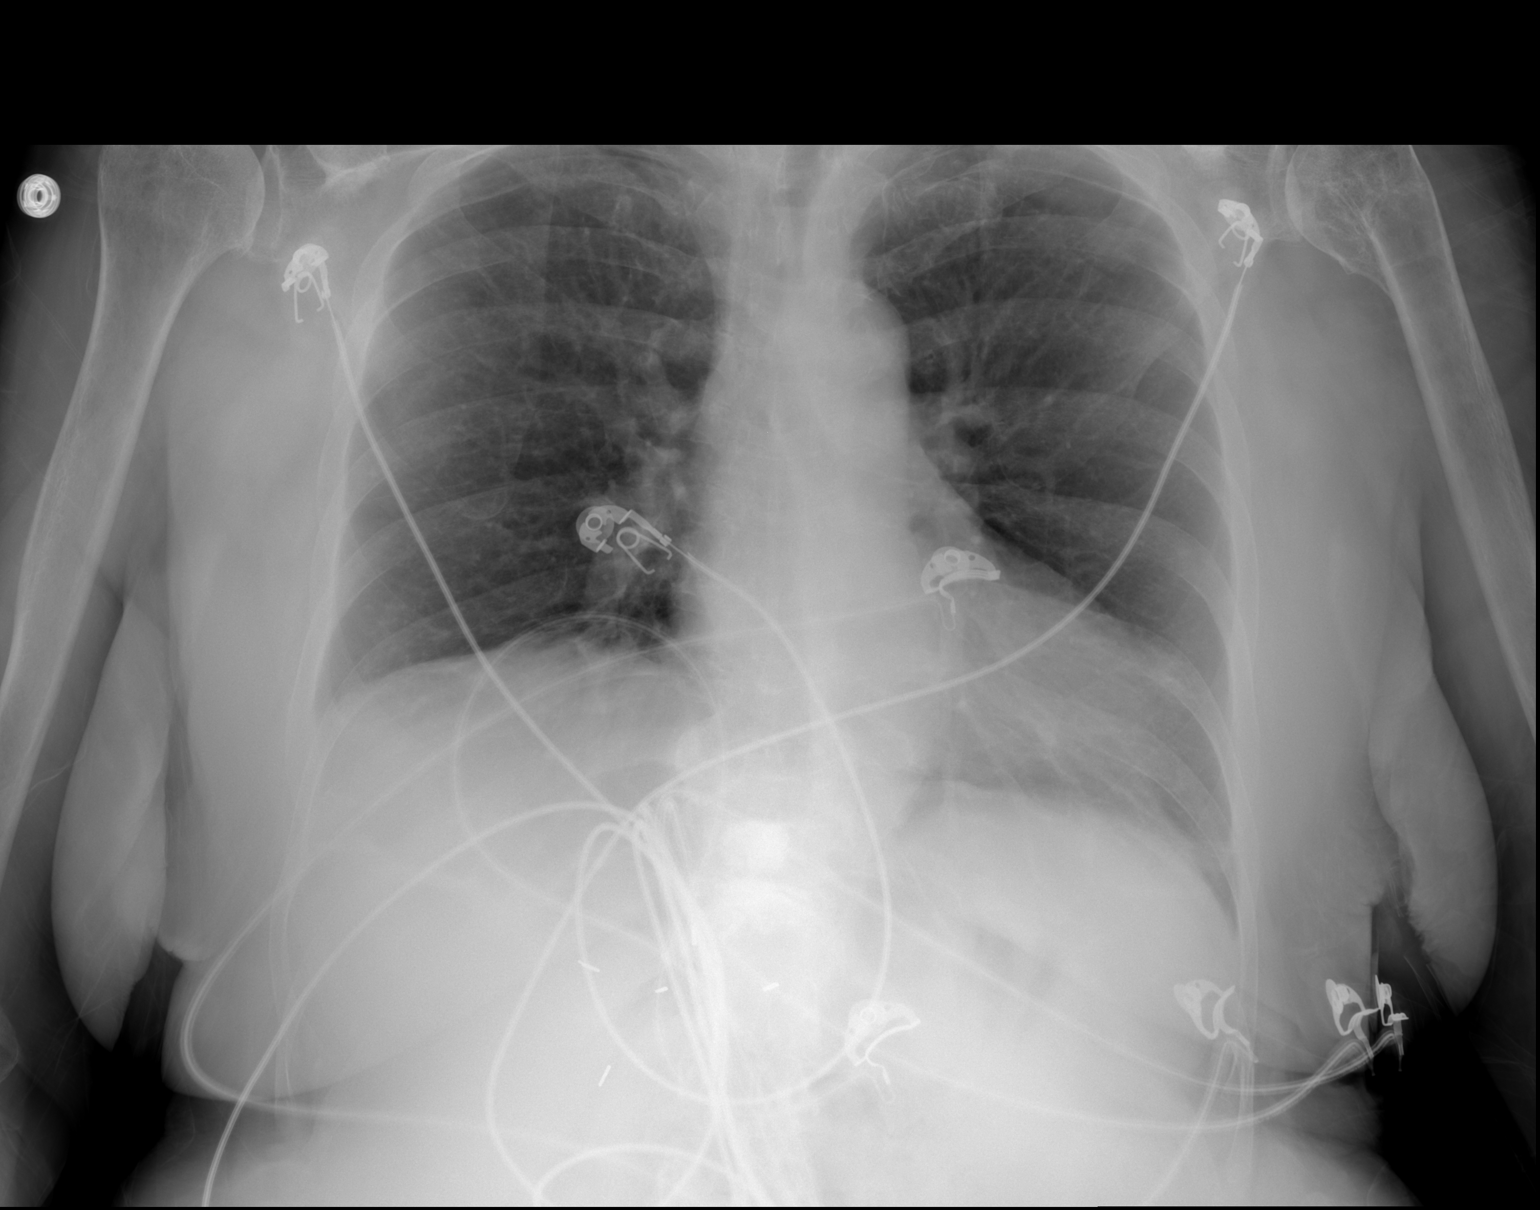

[w chest lat]
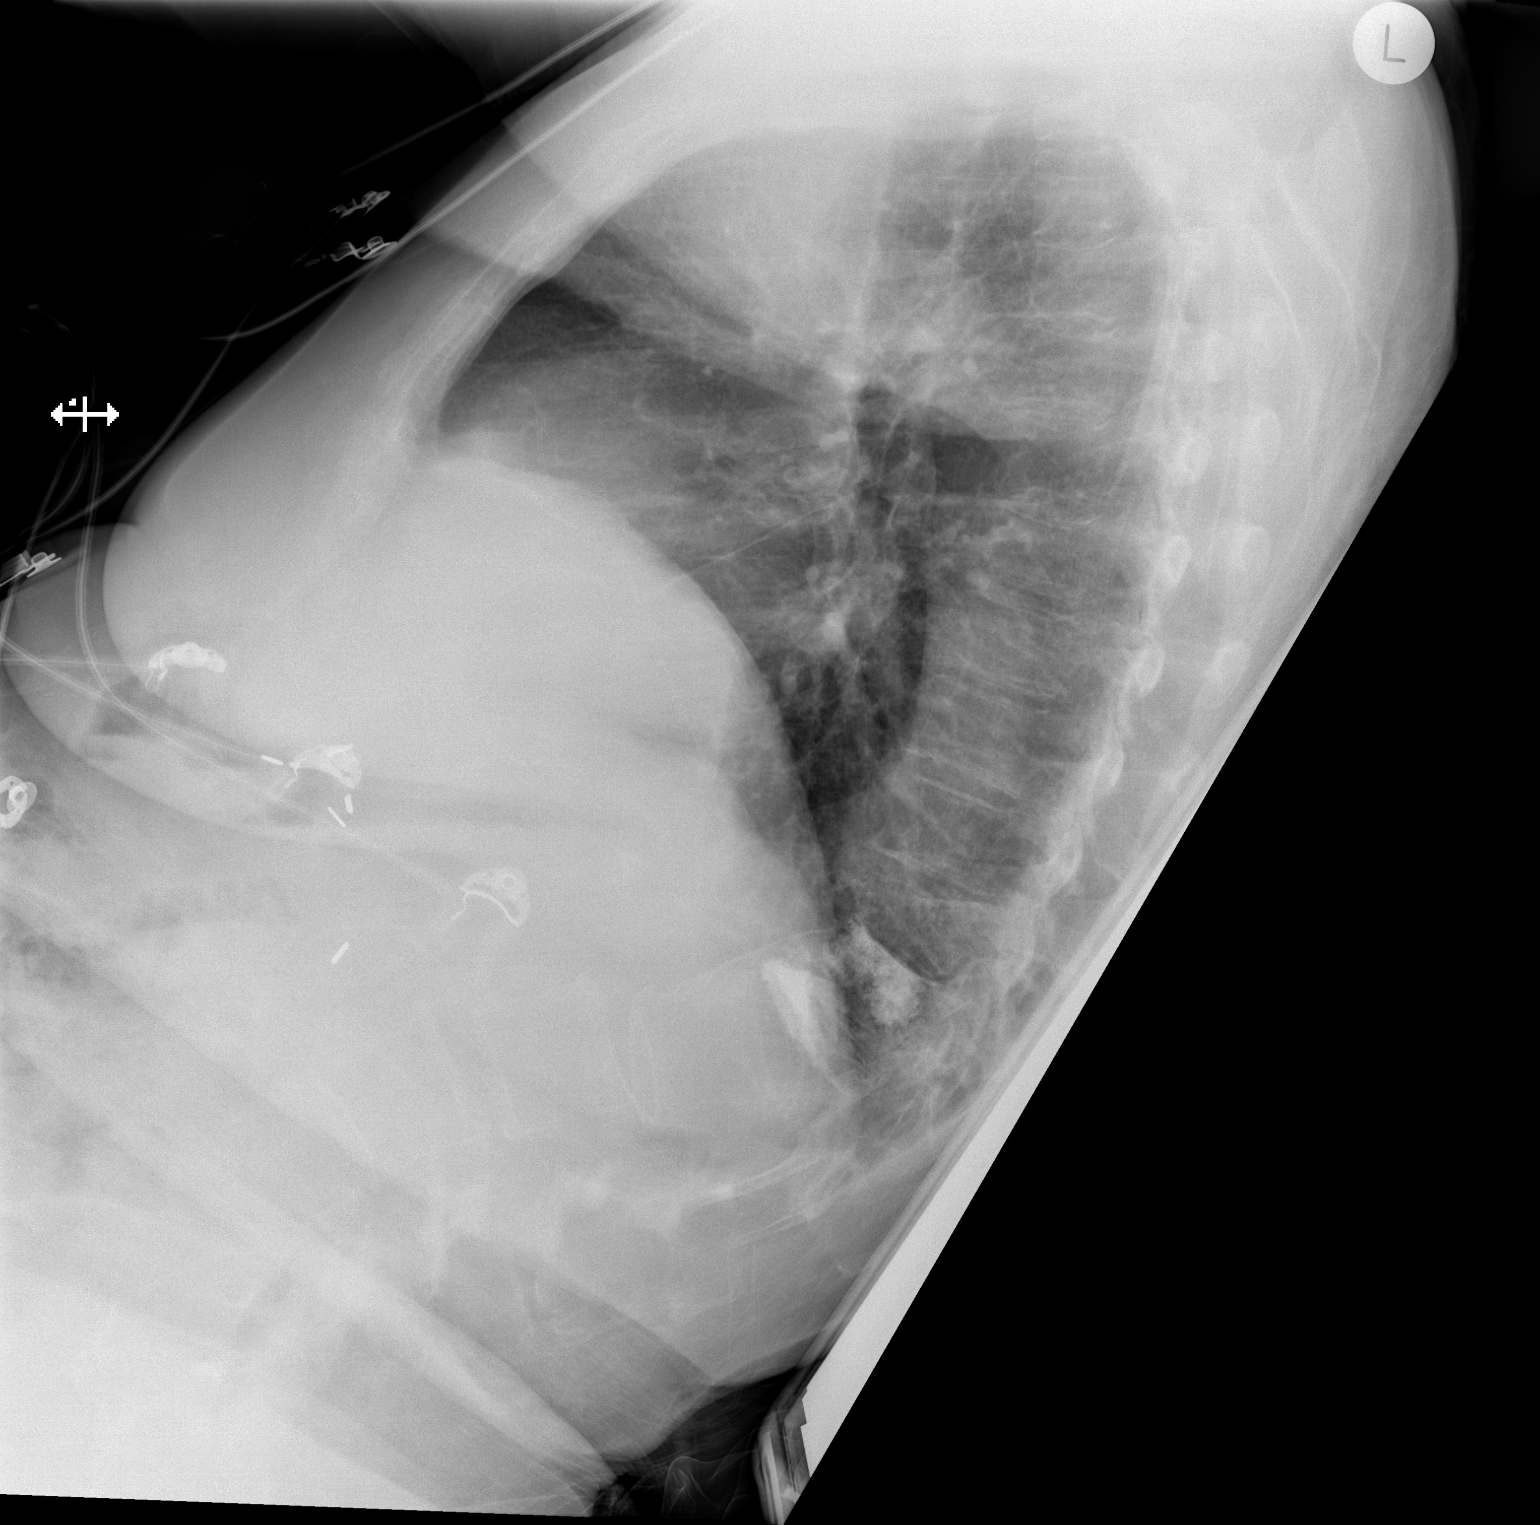

[2 of 2 positions shown; findings below may reference images not displayed]

FINDINGS: Cardiac shadow is within normal limits. Elevation the right
hemidiaphragm is again seen. No focal infiltrate or sizable effusion
is seen. Chronic changes in the proximal left humerus are noted.
Chronic and treated vertebral compression deformities are again
seen.
IMPRESSION: No acute abnormality noted.

## 2016-09-12 IMAGING — CR DG ELBOW COMPLETE 3+V*R*
4 series · 4 of 4 positions shown · non-contrast
Comparison: None.

CLINICAL DATA: Recent fall with right elbow pain

EXAM:
RIGHT ELBOW - COMPLETE 3+ VIEW

[x elbow obl right (1 of 2)]
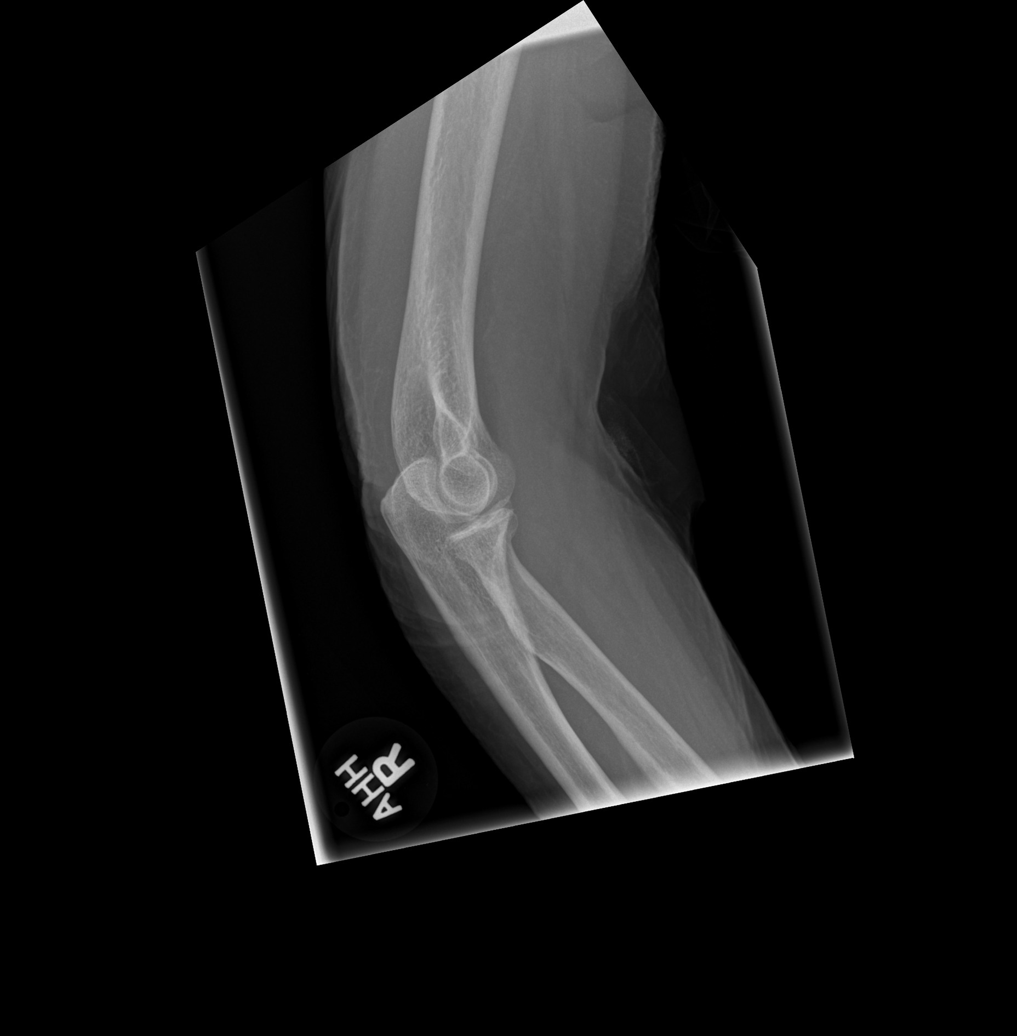

[x elbow ap right]
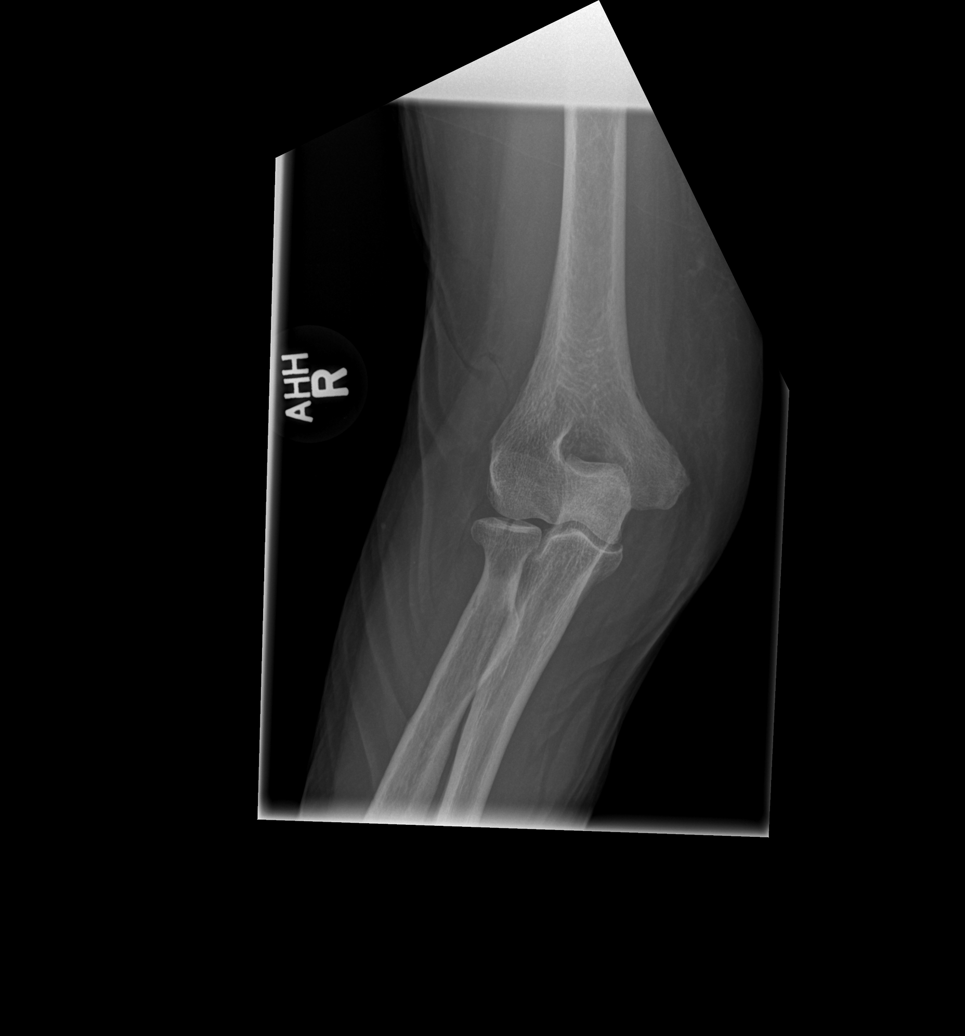

[x elbow obl right (2 of 2)]
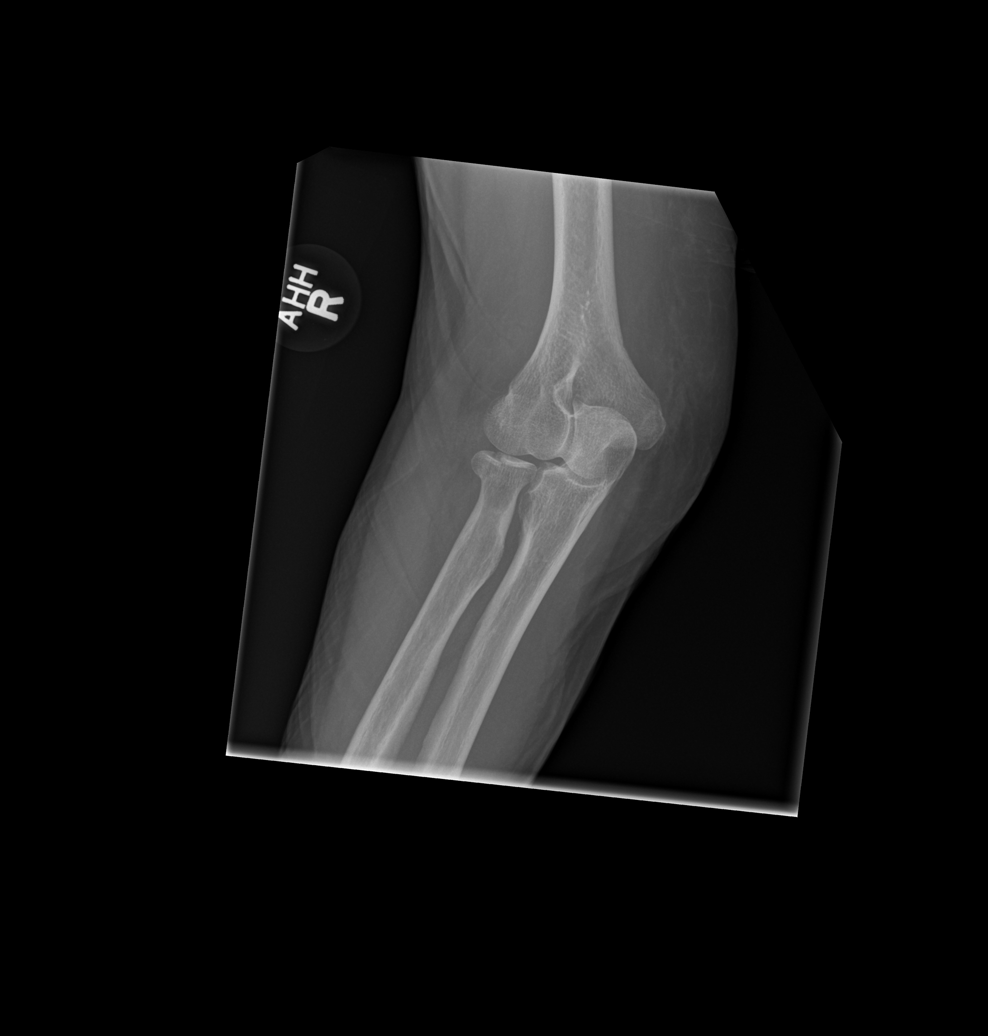

[x elbow lat right]
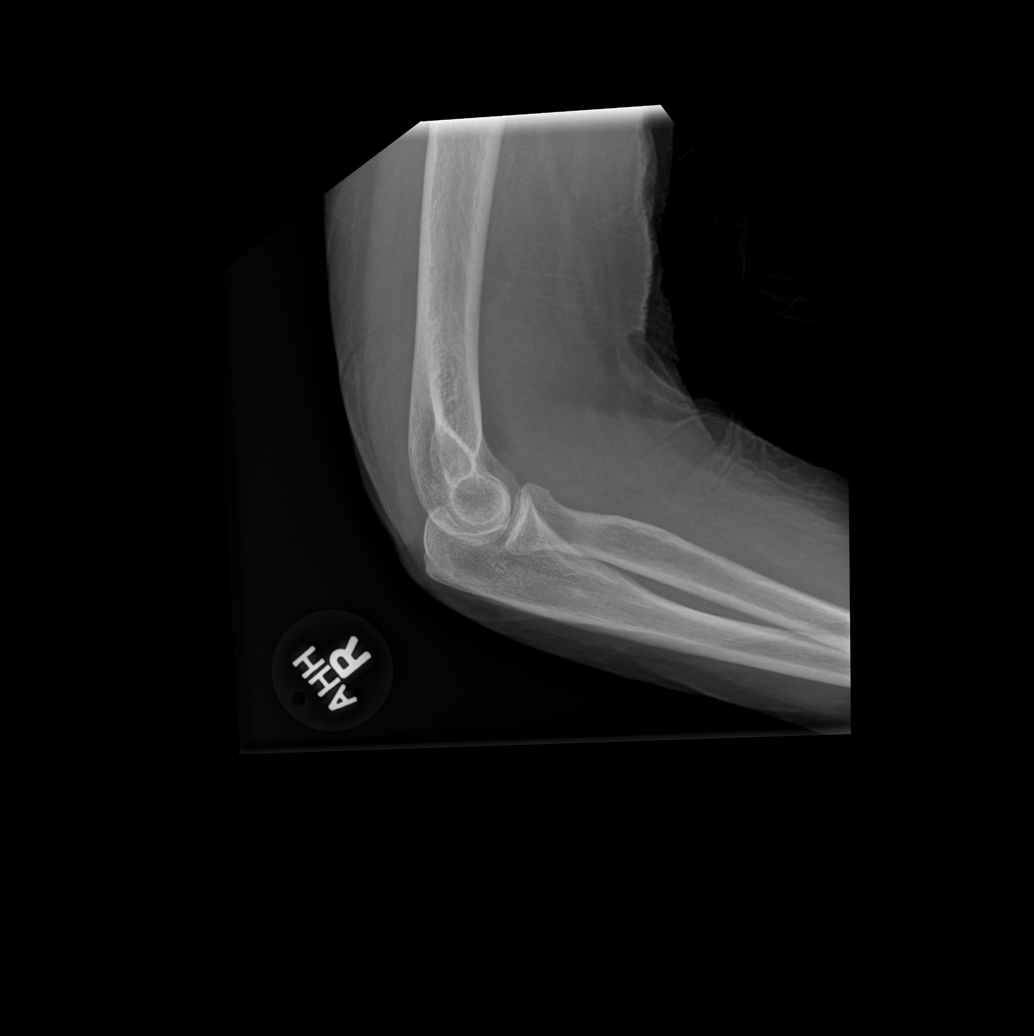

[4 of 4 positions shown; findings below may reference images not displayed]

FINDINGS: There is a mildly depressed radial head fracture with mild elevation
the anterior fat pad. No other focal abnormality is seen.
IMPRESSION: Mildly depressed radial head fracture

## 2017-10-12 ENCOUNTER — Ambulatory Visit: Payer: Medicare Other | Admitting: Gastroenterology

## 2017-12-12 ENCOUNTER — Ambulatory Visit: Payer: Medicare Other | Admitting: Gastroenterology

## 2018-02-03 ENCOUNTER — Emergency Department (HOSPITAL_COMMUNITY)
Admission: EM | Admit: 2018-02-03 | Discharge: 2018-02-03 | Disposition: A | Discharge status: Home / Self Care | Payer: Medicare Other | Attending: Emergency Medicine | Admitting: Emergency Medicine

## 2018-02-03 ENCOUNTER — Emergency Department (HOSPITAL_COMMUNITY): Payer: Medicare Other

## 2018-02-03 ENCOUNTER — Other Ambulatory Visit: Payer: Self-pay

## 2018-02-03 DIAGNOSIS — I10 Essential (primary) hypertension: Secondary | ICD-10-CM | POA: Insufficient documentation

## 2018-02-03 DIAGNOSIS — J069 Acute upper respiratory infection, unspecified: Secondary | ICD-10-CM | POA: Insufficient documentation

## 2018-02-03 DIAGNOSIS — B9789 Other viral agents as the cause of diseases classified elsewhere: Secondary | ICD-10-CM | POA: Insufficient documentation

## 2018-02-03 DIAGNOSIS — Z79899 Other long term (current) drug therapy: Secondary | ICD-10-CM | POA: Diagnosis not present

## 2018-02-03 DIAGNOSIS — E039 Hypothyroidism, unspecified: Secondary | ICD-10-CM | POA: Diagnosis not present

## 2018-02-03 DIAGNOSIS — R05 Cough: Secondary | ICD-10-CM | POA: Diagnosis present

## 2018-02-03 LAB — COMPREHENSIVE METABOLIC PANEL
ALBUMIN: 4.1 g/dL (ref 3.5–5.0)
ALK PHOS: 48 U/L (ref 38–126)
ALT: 13 U/L (ref 0–44)
AST: 19 U/L (ref 15–41)
Anion gap: 9 (ref 5–15)
BILIRUBIN TOTAL: 0.5 mg/dL (ref 0.3–1.2)
BUN: 10 mg/dL (ref 8–23)
CALCIUM: 8.9 mg/dL (ref 8.9–10.3)
CO2: 30 mmol/L (ref 22–32)
Chloride: 95 mmol/L — ABNORMAL LOW (ref 98–111)
Creatinine, Ser: 1.19 mg/dL — ABNORMAL HIGH (ref 0.44–1.00)
GFR calc Af Amer: 50 mL/min — ABNORMAL LOW (ref >60)
GFR calc non Af Amer: 43 mL/min — ABNORMAL LOW (ref >60)
GLUCOSE: 110 mg/dL — AB (ref 70–99)
POTASSIUM: 4 mmol/L (ref 3.5–5.1)
SODIUM: 134 mmol/L — AB (ref 135–145)
TOTAL PROTEIN: 6.1 g/dL — AB (ref 6.5–8.1)

## 2018-02-03 LAB — CBC
HEMATOCRIT: 40.9 % (ref 36.0–46.0)
HEMOGLOBIN: 14 g/dL (ref 12.0–15.0)
MCH: 30.4 pg (ref 26.0–34.0)
MCHC: 34.2 g/dL (ref 30.0–36.0)
MCV: 88.7 fL (ref 80.0–100.0)
NRBC: 0 % (ref 0.0–0.2)
Platelets: 158 10*3/uL (ref 150–400)
RBC: 4.61 MIL/uL (ref 3.87–5.11)
RDW: 12.9 % (ref 11.5–15.5)
WBC: 4.5 10*3/uL (ref 4.0–10.5)

## 2018-02-03 MED ORDER — GUAIFENESIN ER 600 MG PO TB12: 600.0000 mg | ORAL_TABLET | Freq: Two times a day (BID) | ORAL | 0 refills | Status: DC

## 2018-02-03 MED ORDER — BENZONATATE 100 MG PO CAPS: 100.0000 mg | ORAL_CAPSULE | Freq: Three times a day (TID) | ORAL | 0 refills | Status: DC

## 2018-02-03 NOTE — ED Notes (Signed)
[facility] contacted for discharge transport 

## 2018-02-03 NOTE — ED Triage Notes (Signed)
Pt BIB from assisted living facility on [address]. She has been experiencing cough and congestion for 6 weeks now. She tried one dose of a Z pack, but got itchy so she stopped. She started amoxicillin yesterday, but doesn't feel better. Cough is not productive, but she feels like there is mucus to cough up. No SOB, N/V, or fever.

## 2018-02-03 NOTE — Discharge Instructions (Addendum)
Try taking the mucinex and tessalon to help with your cough, follow up with your doctor next week if not improving, return as needed for worsening symptoms

## 2018-02-03 NOTE — ED Provider Notes (Signed)
[facility] DEPT Provider Note   CSN: [id number] Arrival date & time: 02/03/18  1623     History   Chief Complaint Chief Complaint  Patient presents with  . Cough  . Nasal Congestion    HPI Erika Conley is a 83 y.o. female.  HPI Patient presents to the emergency room for evaluation of cough that has persisted for several weeks now.  Patient states the symptoms initially started after Thanksgiving.  She has had a dry cough.  She feels like she has mucus in her chest that she cannot get up.  Her doctor initially tried prescribing azithromycin and prednisone.  Patient states she could not tolerate those and was unable to take them.  She is also had a rash on her extremities and also has developed inside of her mouth.  It is itchy.  Patient was started on amoxicillin last evening.  She continues to have a harsh cough and does not feel any better.  She denies any fevers.  No shortness of breath.  No vomiting or diarrhea. Past Medical History:  Diagnosis Date  . Anxiety   . Arthritis   . Depression   . Falls frequently   . Fibromyalgia   . Fracture, clavicle   . GERD (gastroesophageal reflux disease)   . History of fractured vertebra   . Hypertension   . Hypothyroidism   . Seasonal allergies   . Thyroid disease   . Vertigo   . Vitamin A deficiency   . Wrist fracture, right     Patient Active Problem List   Diagnosis Date Noted  . Nausea and vomiting 12/14/2015  . Hyponatremia 12/13/2015  . Obstructive jaundice   . Bile duct stricture   . Pancreatitis 10/23/2015  . Abdominal pain 10/22/2015  . Pancreatic mass 10/22/2015  . Common bile duct (CBD) obstruction   . Abnormal LFTs   . Dehydration with hyponatremia 04/09/2015  . Acute respiratory failure with hypoxia ([place]) 04/09/2015  . Thrombocytopenia ([place]) 04/09/2015  . Hypokalemia 11/10/2014  . Hypertension 11/10/2014  . Hypothyroidism 11/10/2014  . Syncope due to orthostatic  hypotension 11/10/2014  . Constipated   . UTI (urinary tract infection) 06/04/2013  . Altered mental status 06/04/2013    Past Surgical History:  Procedure Laterality Date  . ABDOMINAL HYSTERECTOMY    . BACK SURGERY    . CHOLECYSTECTOMY    . ERCP N/A 10/22/2015   Procedure: ENDOSCOPIC RETROGRADE CHOLANGIOPANCREATOGRAPHY (ERCP);  Surgeon: Milus Banister, MD;  Location: [facility];  Service: Endoscopy;  Laterality: N/A;  . ERCP N/A 11/25/2015   Procedure: ENDOSCOPIC RETROGRADE CHOLANGIOPANCREATOGRAPHY (ERCP);  Surgeon: Milus Banister, MD;  Location: [facility];  Service: Endoscopy;  Laterality: N/A;  . EUS N/A 11/25/2015   Procedure: ESOPHAGEAL ENDOSCOPIC ULTRASOUND (EUS) RADIAL;  Surgeon: Milus Banister, MD;  Location: [facility];  Service: Endoscopy;  Laterality: N/A;  . FRACTURE SURGERY    . THYROIDECTOMY    . WRIST SURGERY       OB History   No obstetric history on file.      Home Medications    Prior to Admission medications   Medication Sig Start Date End Date Taking? Authorizing Provider  acetaminophen (TYLENOL) 500 MG tablet Take 500 mg by mouth 2 (two) times daily.    [provider]  Alcaftadine (LASTACAFT) 0.25 % SOLN Place 1 drop into both eyes daily.     [provider]  antiseptic oral rinse (BIOTENE) LIQD Swish and spit 15  mLs 3 (three) times daily.     [provider]  azelastine (OPTIVAR) 0.05 % ophthalmic solution Place 1 drop into both eyes 2 (two) times daily.    [provider]  benzonatate (TESSALON) 100 MG capsule Take 1 capsule (100 mg total) by mouth every 8 (eight) hours. 02/03/18   Dorie Rank, MD  busPIRone (BUSPAR) 15 MG tablet Take 22.5 mg by mouth 2 (two) times daily.    [provider]  calcium-vitamin D (OSCAL WITH D) 500-200 MG-UNIT tablet Take 1 tablet by mouth daily.    [provider]  fluorometholone (FML) 0.1 % ophthalmic suspension Place 1 drop into both eyes as directed. Starting  06/01/16 instill 1 drop both eyes 4 times daily for 1 week, then 1 drop both eyes 3 times daily for 1 week, then 1 drop both eyes twice daily for 1 week    [provider]  fluticasone (FLONASE) 50 MCG/ACT nasal spray Place 1 spray into both nostrils 2 (two) times daily.     [provider]  guaiFENesin (MUCINEX) 600 MG 12 hr tablet Take 1 tablet (600 mg total) by mouth 2 (two) times daily. 02/03/18   Dorie Rank, MD  HYDROcodone-acetaminophen (NORCO/VICODIN) 5-325 MG tablet Take 1 tablet by mouth 2 (two) times daily.     [provider]  hydrOXYzine (VISTARIL) 25 MG capsule Take 25 mg by mouth 3 (three) times daily.     [provider]  Infant Care Products (JOHNSONS BABY SHAMPOO EX) Apply topically. Dilute a pea size amount with water; close right eye, wash and rinse. Repeat twice daily for 2 days prior to injection.    [provider]  levothyroxine (SYNTHROID, LEVOTHROID) 88 MCG tablet Take 88 mcg by mouth daily before breakfast.     [provider]  loperamide (IMODIUM) 2 MG capsule Take 2 mg by mouth daily as needed for diarrhea or loose stools. After each loose stool    [provider]  loratadine (CLARITIN) 10 MG tablet Take 10 mg by mouth daily.    [provider]  LORazepam (ATIVAN) 0.5 MG tablet Take 0.5 mg by mouth daily at 12 noon.    [provider]  Multiple Vitamins-Minerals (CERTAVITE SENIOR/ANTIOXIDANT PO) Take 1 tablet by mouth daily.    [provider]  Multiple Vitamins-Minerals (PRESERVISION AREDS) CAPS Take 1 capsule by mouth 2 (two) times daily.    [provider]  omeprazole (PRILOSEC) 40 MG capsule Take 1 capsule (40 mg total) by mouth daily. 01/25/16   Milus Banister, MD  ondansetron (ZOFRAN) 8 MG tablet Take 8 mg by mouth every 8 (eight) hours as needed for nausea or vomiting.    [provider]  sertraline (ZOLOFT) 50 MG tablet Take 75 mg by mouth daily.     [provider]  traZODone (DESYREL) 100 MG tablet Take 200 mg by mouth at bedtime.     [provider]    Family History Family History  Problem Relation Age of Onset  . Hypertension Other     Social History Social History   Tobacco Use  . Smoking status: Never Smoker  . Smokeless tobacco: Never Used  Substance Use Topics  . Alcohol use: No  . Drug use: No     Allergies   Codeine; Cortisone; and Other   Review of Systems Review of Systems  All other systems reviewed and are negative.    Physical Exam Updated Vital Signs BP (!) 157/62  Pulse 67   Resp 18   SpO2 98%   Physical Exam Vitals signs and nursing note reviewed.  Constitutional:      General: She is not in acute distress.    Appearance: She is well-developed. She is not ill-appearing or diaphoretic.  HENT:     Head: Normocephalic and atraumatic.     Right Ear: External ear normal.     Left Ear: External ear normal.     Mouth/Throat:     Comments: Mucosal ulceration  Eyes:     General: No scleral icterus.       Right eye: No discharge.        Left eye: No discharge.     Conjunctiva/sclera: Conjunctivae normal.  Neck:     Musculoskeletal: Neck supple.     Trachea: No tracheal deviation.  Cardiovascular:     Rate and Rhythm: Normal rate and regular rhythm.  Pulmonary:     Effort: Pulmonary effort is normal. No respiratory distress.     Breath sounds: Normal breath sounds. No stridor. No wheezing or rales.  Abdominal:     General: Bowel sounds are normal. There is no distension.     Palpations: Abdomen is soft.     Tenderness: There is no abdominal tenderness. There is no guarding or rebound.  Musculoskeletal:        General: No tenderness.  Skin:    General: Skin is warm and dry.     Findings: No rash.     Comments: Rash consistent of oval erythematous areas, no purpura, no petechiae  Neurological:     Mental Status: She is alert.     Cranial Nerves: No cranial nerve deficit  (no facial droop, extraocular movements intact, no slurred speech).     Sensory: No sensory deficit.     Motor: No abnormal muscle tone or seizure activity.     Coordination: Coordination normal.      ED Treatments / Results  Labs (all labs ordered are listed, but only abnormal results are displayed) Labs Reviewed  COMPREHENSIVE METABOLIC PANEL - Abnormal; Notable for the following components:      Result Value   Sodium 134 (*)    Chloride 95 (*)    Glucose, Bld 110 (*)    Creatinine, Ser 1.19 (*)    Total Protein 6.1 (*)    GFR calc non Af Amer 43 (*)    GFR calc Af Amer 50 (*)    All other components within normal limits  CBC    Radiology Dg Chest 2 View  Result Date: 02/03/2018 CLINICAL DATA:  Cough and congestion EXAM: CHEST - 2 VIEW COMPARISON:  06/02/2016 FINDINGS: The heart size and mediastinal contours are within normal limits. Both lungs are clear. Lower thoracic compression deformity status post augmentation, unchanged. IMPRESSION: No active cardiopulmonary disease. Electronically Signed   By: Ulyses Jarred M.D.   On: 02/03/2018 17:48    Procedures Procedures (including critical care time)  Medications Ordered in ED Medications - No data to display   Initial Impression / Assessment and Plan / ED Course  I have reviewed the triage vital signs and the nursing notes.  Pertinent labs & imaging results that were available during my care of the patient were reviewed by me and considered in my medical decision making (see chart for details).   Patient presented to emergency room for evaluation of persistent cough.  Patient is afebrile.  Her laboratory tests are reassuring.  Chest x-ray does not show  pneumonia.  Symptoms are more suggestive of a viral process.  She does have a rash that could be related to this viral illness or there could be an allergic component to it.  Patient is already on antihistamines.  Cannot tolerate steroids.  I will have her try taking  guaifenesin and Tessalon for symptomatic relief.  Final Clinical Impressions(s) / ED Diagnoses   Final diagnoses:  Viral URI with cough    ED Discharge Orders         Ordered    benzonatate (TESSALON) 100 MG capsule  Every 8 hours     02/03/18 1858    guaiFENesin (MUCINEX) 600 MG 12 hr tablet  2 times daily     02/03/18 1858           Dorie Rank, MD 02/03/18 1901

## 2018-07-07 ENCOUNTER — Emergency Department (HOSPITAL_COMMUNITY)
Admission: EM | Admit: 2018-07-07 | Discharge: 2018-07-07 | Disposition: A | Discharge status: Skilled Nursing Facility | Payer: Medicare Other | Attending: Emergency Medicine | Admitting: Emergency Medicine

## 2018-07-07 ENCOUNTER — Emergency Department (HOSPITAL_COMMUNITY): Payer: Medicare Other

## 2018-07-07 ENCOUNTER — Other Ambulatory Visit: Payer: Self-pay

## 2018-07-07 ENCOUNTER — Encounter (HOSPITAL_COMMUNITY): Payer: Self-pay

## 2018-07-07 DIAGNOSIS — R55 Syncope and collapse: Secondary | ICD-10-CM

## 2018-07-07 DIAGNOSIS — E039 Hypothyroidism, unspecified: Secondary | ICD-10-CM | POA: Insufficient documentation

## 2018-07-07 DIAGNOSIS — Z79899 Other long term (current) drug therapy: Secondary | ICD-10-CM | POA: Insufficient documentation

## 2018-07-07 DIAGNOSIS — R531 Weakness: Secondary | ICD-10-CM | POA: Diagnosis not present

## 2018-07-07 DIAGNOSIS — I10 Essential (primary) hypertension: Secondary | ICD-10-CM | POA: Diagnosis not present

## 2018-07-07 LAB — CBC
HCT: 40.9 % (ref 36.0–46.0)
Hemoglobin: 14.2 g/dL (ref 12.0–15.0)
MCH: 31 pg (ref 26.0–34.0)
MCHC: 34.7 g/dL (ref 30.0–36.0)
MCV: 89.3 fL (ref 80.0–100.0)
Platelets: 144 10*3/uL — ABNORMAL LOW (ref 150–400)
RBC: 4.58 MIL/uL (ref 3.87–5.11)
RDW: 13.2 % (ref 11.5–15.5)
WBC: 4.4 10*3/uL (ref 4.0–10.5)
nRBC: 0 % (ref 0.0–0.2)

## 2018-07-07 LAB — HEPATIC FUNCTION PANEL
ALT: 12 U/L (ref 0–44)
AST: 17 U/L (ref 15–41)
Albumin: 4.2 g/dL (ref 3.5–5.0)
Alkaline Phosphatase: 38 U/L (ref 38–126)
Bilirubin, Direct: 0.2 mg/dL (ref 0.0–0.2)
Indirect Bilirubin: 0.6 mg/dL (ref 0.3–0.9)
Total Bilirubin: 0.8 mg/dL (ref 0.3–1.2)
Total Protein: 5.9 g/dL — ABNORMAL LOW (ref 6.5–8.1)

## 2018-07-07 LAB — URINALYSIS, ROUTINE W REFLEX MICROSCOPIC
Bacteria, UA: NONE SEEN
Bilirubin Urine: NEGATIVE
Glucose, UA: NEGATIVE mg/dL
Hgb urine dipstick: NEGATIVE
Ketones, ur: NEGATIVE mg/dL
Nitrite: NEGATIVE
Protein, ur: NEGATIVE mg/dL
Specific Gravity, Urine: 1.005 (ref 1.005–1.030)
pH: 6 (ref 5.0–8.0)

## 2018-07-07 LAB — LIPASE, BLOOD: Lipase: 34 U/L (ref 11–51)

## 2018-07-07 LAB — BASIC METABOLIC PANEL
Anion gap: 7 (ref 5–15)
BUN: 11 mg/dL (ref 8–23)
CO2: 30 mmol/L (ref 22–32)
Calcium: 8.8 mg/dL — ABNORMAL LOW (ref 8.9–10.3)
Chloride: 100 mmol/L (ref 98–111)
Creatinine, Ser: 0.86 mg/dL (ref 0.44–1.00)
GFR calc Af Amer: >60 mL/min (ref >60)
GFR calc non Af Amer: >60 mL/min (ref >60)
Glucose, Bld: 99 mg/dL (ref 70–99)
Potassium: 3.9 mmol/L (ref 3.5–5.1)
Sodium: 137 mmol/L (ref 135–145)

## 2018-07-07 LAB — CBG MONITORING, ED: Glucose-Capillary: 92 mg/dL (ref 70–99)

## 2018-07-07 LAB — TROPONIN I: Troponin I: <0.03 ng/mL (ref <0.03)

## 2018-07-07 MED ORDER — SODIUM CHLORIDE 0.9 % IV BOLUS
500.0000 mL | Freq: Once | INTRAVENOUS | Status: AC
  Administered 2018-07-07: 500 mL via INTRAVENOUS

## 2018-07-07 NOTE — ED Provider Notes (Signed)
[place] DEPT Provider Note   CSN: [id number] Arrival date & time: 07/07/18  0946    History   Chief Complaint Chief Complaint  Patient presents with   Near Syncope   Weakness    HPI Erika Conley is a 84 y.o. female.     HPI Patient states that yesterday she had upper abdominal discomfort which she attributes to her gastritis.  She is also had some loose stools.  This morning patient stood up and became lightheaded and nauseated.  States she sat down on the toilet and had a brief loss of consciousness.  No known trauma.  Patient is currently complaining of generalized headache which she says she gets frequently.  No visual difficulties, speech changes, focal weakness or numbness.  Patient denies chest pain or shortness of breath.  She has had a mild nonproductive cough.  No recent fever or chills.  Patient was noted to be bradycardic and hypotensive on EMS arrival.  Given IV fluids en route. Past Medical History:  Diagnosis Date   Anxiety    Arthritis    Depression    Falls frequently    Fibromyalgia    Fracture, clavicle    GERD (gastroesophageal reflux disease)    History of fractured vertebra    Hypertension    Hypothyroidism    Seasonal allergies    Thyroid disease    Vertigo    Vitamin A deficiency    Wrist fracture, right     Patient Active Problem List   Diagnosis Date Noted   Nausea and vomiting 12/14/2015   Hyponatremia 12/13/2015   Obstructive jaundice    Bile duct stricture    Pancreatitis 10/23/2015   Abdominal pain 10/22/2015   Pancreatic mass 10/22/2015   Common bile duct (CBD) obstruction    Abnormal LFTs    Dehydration with hyponatremia 04/09/2015   Acute respiratory failure with hypoxia ([place]) 04/09/2015   Thrombocytopenia ([place]) 04/09/2015   Hypokalemia 11/10/2014   Hypertension 11/10/2014   Hypothyroidism 11/10/2014   Syncope due to orthostatic hypotension 11/10/2014    Constipated    UTI (urinary tract infection) 06/04/2013   Altered mental status 06/04/2013    Past Surgical History:  Procedure Laterality Date   ABDOMINAL HYSTERECTOMY     BACK SURGERY     CHOLECYSTECTOMY     ERCP N/A 10/22/2015   Procedure: ENDOSCOPIC RETROGRADE CHOLANGIOPANCREATOGRAPHY (ERCP);  Surgeon: Milus Banister, MD;  Location: [facility];  Service: Endoscopy;  Laterality: N/A;   ERCP N/A 11/25/2015   Procedure: ENDOSCOPIC RETROGRADE CHOLANGIOPANCREATOGRAPHY (ERCP);  Surgeon: Milus Banister, MD;  Location: [facility];  Service: Endoscopy;  Laterality: N/A;   EUS N/A 11/25/2015   Procedure: ESOPHAGEAL ENDOSCOPIC ULTRASOUND (EUS) RADIAL;  Surgeon: Milus Banister, MD;  Location: [facility];  Service: Endoscopy;  Laterality: N/A;   FRACTURE SURGERY     THYROIDECTOMY     WRIST SURGERY       OB History   No obstetric history on file.      Home Medications    Prior to Admission medications   Medication Sig Start Date End Date Taking? Authorizing Provider  acetaminophen (TYLENOL) 500 MG tablet Take 500 mg by mouth 2 (two) times daily.    [provider]  Alcaftadine (LASTACAFT) 0.25 % SOLN Place 1 drop into both eyes daily.     [provider]  antiseptic oral rinse (BIOTENE) LIQD Swish and spit 15 mLs 3 (three) times daily.     [provider]  azelastine (OPTIVAR) 0.05 % ophthalmic solution Place 1 drop into both eyes 2 (two) times daily.    [provider]  benzonatate (TESSALON) 100 MG capsule Take 1 capsule (100 mg total) by mouth every 8 (eight) hours. 02/03/18   Dorie Rank, MD  busPIRone (BUSPAR) 15 MG tablet Take 22.5 mg by mouth 2 (two) times daily.    [provider]  calcium-vitamin D (OSCAL WITH D) 500-200 MG-UNIT tablet Take 1 tablet by mouth daily.    [provider]  fluorometholone (FML) 0.1 % ophthalmic suspension Place 1 drop into both eyes as directed. Starting 06/01/16 instill 1 drop both  eyes 4 times daily for 1 week, then 1 drop both eyes 3 times daily for 1 week, then 1 drop both eyes twice daily for 1 week    [provider]  fluticasone (FLONASE) 50 MCG/ACT nasal spray Place 1 spray into both nostrils 2 (two) times daily.     [provider]  guaiFENesin (MUCINEX) 600 MG 12 hr tablet Take 1 tablet (600 mg total) by mouth 2 (two) times daily. 02/03/18   Dorie Rank, MD  HYDROcodone-acetaminophen (NORCO/VICODIN) 5-325 MG tablet Take 1 tablet by mouth 2 (two) times daily.     [provider]  hydrOXYzine (VISTARIL) 25 MG capsule Take 25 mg by mouth 3 (three) times daily.     [provider]  Infant Care Products (JOHNSONS BABY SHAMPOO EX) Apply topically. Dilute a pea size amount with water; close right eye, wash and rinse. Repeat twice daily for 2 days prior to injection.    [provider]  levothyroxine (SYNTHROID, LEVOTHROID) 88 MCG tablet Take 88 mcg by mouth daily before breakfast.     [provider]  loperamide (IMODIUM) 2 MG capsule Take 2 mg by mouth daily as needed for diarrhea or loose stools. After each loose stool    [provider]  loratadine (CLARITIN) 10 MG tablet Take 10 mg by mouth daily.    [provider]  LORazepam (ATIVAN) 0.5 MG tablet Take 0.5 mg by mouth daily at 12 noon.    [provider]  Multiple Vitamins-Minerals (CERTAVITE SENIOR/ANTIOXIDANT PO) Take 1 tablet by mouth daily.    [provider]  Multiple Vitamins-Minerals (PRESERVISION AREDS) CAPS Take 1 capsule by mouth 2 (two) times daily.    [provider]  omeprazole (PRILOSEC) 40 MG capsule Take 1 capsule (40 mg total) by mouth daily. 01/25/16   Milus Banister, MD  ondansetron (ZOFRAN) 8 MG tablet Take 8 mg by mouth every 8 (eight) hours as needed for nausea or vomiting.    [provider]  sertraline (ZOLOFT) 50 MG tablet Take 75 mg by mouth daily.     [provider]  traZODone  (DESYREL) 100 MG tablet Take 200 mg by mouth at bedtime.     [provider]    Family History Family History  Problem Relation Age of Onset   Hypertension Other     Social History Social History   Tobacco Use   Smoking status: Never Smoker   Smokeless tobacco: Never Used  Substance Use Topics   Alcohol use: No   Drug use: No     Allergies   Codeine; Cortisone; and Other   Review of Systems Review of Systems  Constitutional: Positive for fatigue. Negative for chills and fever.  HENT: Negative for facial swelling and trouble swallowing.   Eyes: Negative for visual disturbance.  Respiratory: Positive  for cough. Negative for shortness of breath.   Cardiovascular: Negative for chest pain, palpitations and leg swelling.  Gastrointestinal: Positive for abdominal pain, diarrhea and nausea. Negative for constipation and vomiting.  Genitourinary: Negative for dysuria, flank pain and frequency.  Musculoskeletal: Positive for neck pain. Negative for back pain.  Skin: Negative for rash and wound.  Neurological: Positive for syncope, light-headedness and headaches. Negative for weakness and numbness.  All other systems reviewed and are negative.    Physical Exam Updated Vital Signs BP (!) 152/66    Pulse (!) 57    Temp 97.7 F (36.5 C) (Oral)    Resp 13    Ht 4\' 11"  (1.499 m)    Wt 63.5 kg    SpO2 95%    BMI 28.28 kg/m   Physical Exam Vitals signs and nursing note reviewed.  Constitutional:      Appearance: Normal appearance. She is well-developed.  HENT:     Head: Normocephalic and atraumatic.     Comments: No obvious head trauma.  No intraoral trauma.  Cranial nerves II through XII intact.    Nose: Nose normal. No congestion.     Mouth/Throat:     Mouth: Mucous membranes are moist.  Eyes:     Extraocular Movements: Extraocular movements intact.     Pupils: Pupils are equal, round, and reactive to light.  Neck:     Musculoskeletal: Normal range of motion  and neck supple. Muscular tenderness present. No neck rigidity.     Vascular: No carotid bruit.     Comments: Mild diffuse posterior cervical tenderness to palpation without focality, step-offs or deformities. Cardiovascular:     Rate and Rhythm: Regular rhythm. Bradycardia present.     Heart sounds: No murmur. No friction rub. No gallop.   Pulmonary:     Effort: Pulmonary effort is normal. No respiratory distress.     Comments: Diminished breath sounds in the right base.  No respiratory distress. Abdominal:     General: Bowel sounds are normal. There is no distension.     Palpations: Abdomen is soft. There is no mass.     Tenderness: There is abdominal tenderness. There is no right CVA tenderness, left CVA tenderness, guarding or rebound.     Hernia: No hernia is present.     Comments: Mild epigastric tenderness to palpation.  Musculoskeletal: Normal range of motion.        General: No swelling, tenderness, deformity or signs of injury.     Right lower leg: No edema.     Left lower leg: No edema.  Lymphadenopathy:     Cervical: No cervical adenopathy.  Skin:    General: Skin is warm and dry.     Capillary Refill: Capillary refill takes less than 2 seconds.     Findings: No erythema or rash.  Neurological:     General: No focal deficit present.     Mental Status: She is alert and oriented to person, place, and time.     Comments: Patient is alert and oriented x3 with clear, goal oriented speech. Patient has 5/5 motor in all extremities. Sensation is intact to light touch. Bilateral finger-to-nose is normal with no signs of dysmetria.  Psychiatric:        Mood and Affect: Mood normal.        Behavior: Behavior normal.      ED Treatments / Results  Labs (all labs ordered are listed, but only abnormal results are displayed) Labs Reviewed  BASIC  METABOLIC PANEL - Abnormal; Notable for the following components:      Result Value   Calcium 8.8 (*)    All other components within  normal limits  CBC - Abnormal; Notable for the following components:   Platelets 144 (*)    All other components within normal limits  URINALYSIS, ROUTINE W REFLEX MICROSCOPIC - Abnormal; Notable for the following components:   Leukocytes,Ua TRACE (*)    All other components within normal limits  HEPATIC FUNCTION PANEL - Abnormal; Notable for the following components:   Total Protein 5.9 (*)    All other components within normal limits  LIPASE, BLOOD  TROPONIN I  CBG MONITORING, ED    EKG EKG Interpretation  Date/Time:  Sunday Jul 07 2018 10:06:35 EDT Ventricular Rate:  53 PR Interval:    QRS Duration: 98 QT Interval:  511 QTC Calculation: 480 R Axis:   -7 Text Interpretation:  Sinus rhythm Borderline prolonged PR interval Low voltage, precordial leads Confirmed by Julianne Rice [phone]) on 07/07/2018 10:39:43 AM   Radiology Ct Head Wo Contrast  Result Date: 07/07/2018 CLINICAL DATA:  Dizziness and weakness. EXAM: CT HEAD WITHOUT CONTRAST CT CERVICAL SPINE WITHOUT CONTRAST TECHNIQUE: Multidetector CT imaging of the head and cervical spine was performed following the standard protocol without intravenous contrast. Multiplanar CT image reconstructions of the cervical spine were also generated. COMPARISON:  Head CT-02/10/2015; head and cervical spine CT-02/09/2015 FINDINGS: CT HEAD FINDINGS Brain: Similar findings of atrophy with sulcal prominence centralized volume loss with commensurate ex vacuo dilatation of the ventricular system. Scattered periventricular hypodensities compatible with microvascular ischemic disease. No CT evidence of acute large territory infarct. No intraparenchymal or extra-axial mass or hemorrhage. Unchanged size and configuration of the ventricles and basilar cisterns. No midline shift. Vascular: Intracranial atherosclerosis. Skull: No displaced calvarial fracture. Sinuses/Orbits: Limited visualization the paranasal sinuses and the mastoid air cells is normal.  No air-fluid levels. A minimal amount of debris is seen within the right external auditory canal. Post right-sided cataract surgery. Other: Regional soft tissues appear normal. _________________________________________________________ CT CERVICAL SPINE FINDINGS Alignment: C1 to the superior endplate of T3 is imaged. Normal alignment of the cervical spine. No anterolisthesis or retrolisthesis. The bilateral facets are normally aligned. Skull base and vertebrae: The dens is normally positioned between the lateral masses of C1. Moderate to severe degenerative change of the atlantodental articulation with associated pannus formation. Normal atlantoaxial articulations. Cervical vertebral body heights are preserved. Soft tissues and spinal canal: Prevertebral soft tissues are normal. Disc levels: Moderate to severe DDD of C5-C6 with disc space height loss, endplate irregularity and small posteriorly directed disc osteophyte complex at this location. Moderate to severe bilateral facet degenerative change throughout the cervical spine. Upper chest: Minimal interstitial thickening within the imaged bilateral lung apices. Other: Suspected approximately 0.5 cm peripherally calcified nodule within the right lobe of the thyroid, of doubtful clinical concern. No bulky cervical lymphadenopathy on this noncontrast examination IMPRESSION: 1. Similar findings of atrophy and microvascular ischemic disease without superimposed acute intracranial process. 2. No fracture or static subluxation of the cervical spine. 3. Moderate to severe DDD of C5-C6. 4. Moderate severe degenerative change of the atlantodental articulation with associated pannus formation. Electronically Signed   By: Sandi Mariscal M.D.   On: 07/07/2018 11:33   Ct Cervical Spine Wo Contrast  Result Date: 07/07/2018 CLINICAL DATA:  Dizziness and weakness. EXAM: CT HEAD WITHOUT CONTRAST CT CERVICAL SPINE WITHOUT CONTRAST TECHNIQUE: Multidetector CT imaging of the head and  cervical  spine was performed following the standard protocol without intravenous contrast. Multiplanar CT image reconstructions of the cervical spine were also generated. COMPARISON:  Head CT-02/10/2015; head and cervical spine CT-02/09/2015 FINDINGS: CT HEAD FINDINGS Brain: Similar findings of atrophy with sulcal prominence centralized volume loss with commensurate ex vacuo dilatation of the ventricular system. Scattered periventricular hypodensities compatible with microvascular ischemic disease. No CT evidence of acute large territory infarct. No intraparenchymal or extra-axial mass or hemorrhage. Unchanged size and configuration of the ventricles and basilar cisterns. No midline shift. Vascular: Intracranial atherosclerosis. Skull: No displaced calvarial fracture. Sinuses/Orbits: Limited visualization the paranasal sinuses and the mastoid air cells is normal. No air-fluid levels. A minimal amount of debris is seen within the right external auditory canal. Post right-sided cataract surgery. Other: Regional soft tissues appear normal. _________________________________________________________ CT CERVICAL SPINE FINDINGS Alignment: C1 to the superior endplate of T3 is imaged. Normal alignment of the cervical spine. No anterolisthesis or retrolisthesis. The bilateral facets are normally aligned. Skull base and vertebrae: The dens is normally positioned between the lateral masses of C1. Moderate to severe degenerative change of the atlantodental articulation with associated pannus formation. Normal atlantoaxial articulations. Cervical vertebral body heights are preserved. Soft tissues and spinal canal: Prevertebral soft tissues are normal. Disc levels: Moderate to severe DDD of C5-C6 with disc space height loss, endplate irregularity and small posteriorly directed disc osteophyte complex at this location. Moderate to severe bilateral facet degenerative change throughout the cervical spine. Upper chest: Minimal  interstitial thickening within the imaged bilateral lung apices. Other: Suspected approximately 0.5 cm peripherally calcified nodule within the right lobe of the thyroid, of doubtful clinical concern. No bulky cervical lymphadenopathy on this noncontrast examination IMPRESSION: 1. Similar findings of atrophy and microvascular ischemic disease without superimposed acute intracranial process. 2. No fracture or static subluxation of the cervical spine. 3. Moderate to severe DDD of C5-C6. 4. Moderate severe degenerative change of the atlantodental articulation with associated pannus formation. Electronically Signed   By: Sandi Mariscal M.D.   On: 07/07/2018 11:33   Dg Chest Port 1 View  Result Date: 07/07/2018 CLINICAL DATA:  Dizziness and weakness beginning today.  Hypoxia. EXAM: PORTABLE CHEST 1 VIEW COMPARISON:  None. FINDINGS: Heart size is upper limits of normal. Lung volumes are low. No focal airspace disease is present. There is no edema or effusion. Aortic atherosclerosis is present. IMPRESSION: 1. Low lung volumes. 2. Borderline cardiomegaly without failure. 3. Aortic atherosclerosis. Electronically Signed   By: San Morelle M.D.   On: 07/07/2018 10:54    Procedures Procedures (including critical care time)  Medications Ordered in ED Medications  sodium chloride 0.9 % bolus 500 mL (0 mLs Intravenous Stopped 07/07/18 1155)     Initial Impression / Assessment and Plan / ED Course  I have reviewed the triage vital signs and the nursing notes.  Pertinent labs & imaging results that were available during my care of the patient were reviewed by me and considered in my medical decision making (see chart for details).        Ambulates without difficulty.  No complaints at this time.  Normal neurologic exam.  Work-up is essentially normal.  Suspect possible vasovagal episode.  Encouraged to drink plenty of fluids.  Believe she can be discharged to follow-up with her primary physician.   Return precautions given.  Final Clinical Impressions(s) / ED Diagnoses   Final diagnoses:  Syncope and collapse    ED Discharge Orders    None  Julianne Rice, MD 07/07/18 1336

## 2018-07-07 NOTE — ED Notes (Signed)
Bed: WA20 Expected date:  Expected time:  Means of arrival:  Comments: 84 yo weakness w/syncope

## 2018-07-07 NOTE — ED Notes (Signed)
[facility] has been called and will be in route to transport patient ASAP.  Meal has been provided to patient while patient waits for transport.

## 2018-07-07 NOTE — ED Notes (Signed)
Portable Xray at bedside.

## 2018-07-07 NOTE — ED Notes (Addendum)
Patient ambulated to restroom after ortho vitals were conducted without complication or issues regarding feeling dizzy or light headed.

## 2018-07-07 NOTE — ED Triage Notes (Signed)
Patient arrived via [facility] from [facility]. Patient is AOx4 and ambulatory with device / assistance. Patient called 911 due to having dizzness and weakness which started this morning. Patient has received 470mL's of Normal saline, Patient had O2 sat of 90-91 on room air and was placed on 2L Nasal Canula. Patient is not on O2. Patient has no other complaints.   BP on arrival- 80/60 Pulse - between 45-55

## 2018-07-16 ENCOUNTER — Other Ambulatory Visit: Payer: Self-pay

## 2018-07-16 ENCOUNTER — Ambulatory Visit (INDEPENDENT_AMBULATORY_CARE_PROVIDER_SITE_OTHER): Payer: Medicare Other | Admitting: Gastroenterology

## 2018-07-16 VITALS — Wt 140.0 lb

## 2018-07-16 DIAGNOSIS — R109 Unspecified abdominal pain: Secondary | ICD-10-CM

## 2018-07-16 DIAGNOSIS — R140 Abdominal distension (gaseous): Secondary | ICD-10-CM | POA: Diagnosis not present

## 2018-07-16 MED ORDER — OMEPRAZOLE 40 MG PO CPDR: 40.0000 mg | DELAYED_RELEASE_CAPSULE | Freq: Two times a day (BID) | ORAL | 5 refills | Status: DC

## 2018-07-16 NOTE — Patient Instructions (Addendum)
We will arrange a CT scan abdomen pelvis with IV and oral contrast for her abdominal pain, epigastric.  [facility] will contact Wells Guiles for a date and time and to go over instructions   We will call in a new prescription for Prilosec for her.  It will be omeprazole 40 mg 1 pill twice daily shortly before breakfast and dinner meals.  Dispense 60 with 5 refills.  This needs to go to [facility] in [place] have been scheduled for a CT scan of the abdomen and pelvis at .   You are scheduled on  at . You should arrive 15 minutes prior to your appointment time for registration. Please follow the written instructions below on the day of your exam:  WARNING: IF YOU ARE ALLERGIC TO IODINE/X-RAY DYE, PLEASE NOTIFY RADIOLOGY IMMEDIATELY AT [phone]! YOU WILL BE GIVEN A 13 HOUR PREMEDICATION PREP.  1) Do not eat or drink anything after  (4 hours prior to your test) 2) You have been given 2 bottles of oral contrast to drink. The solution may taste better if refrigerated, but do NOT add ice or any other liquid to this solution. Shake well before drinking.    Drink 1 bottle of contrast @  (2 hours prior to your exam)  Drink 1 bottle of contrast @  (1 hour prior to your exam)  You may take any medications as prescribed with a small amount of water, if necessary. If you take any of the following medications: METFORMIN, GLUCOPHAGE, GLUCOVANCE, AVANDAMET, RIOMET, FORTAMET, [place] MET, JANUMET, GLUMETZA or METAGLIP, you MAY be asked to HOLD this medication 48 hours AFTER the exam.  The purpose of you drinking the oral contrast is to aid in the visualization of your intestinal tract. The contrast solution may cause some diarrhea. Depending on your individual set of symptoms, you may also receive an intravenous injection of x-ray contrast/dye. Plan on being at [facility] for 30 minutes or longer, depending on the type of exam you are having performed.  This test typically takes 30-45  minutes to complete.  If you have any questions regarding your exam or if you need to reschedule, you may call the CT department at [phone] between the hours of 8:00 am and 5:00 pm, Monday-Friday.  ________________________________________________________________________

## 2018-07-16 NOTE — Progress Notes (Signed)
Review of pertinent gastrointestinal problems: 1. Common bile duct dilation (etiology unknown... Overall most suspicious for papillary stenosis) seen on CT scan 10/2015; presented with abd pain, elevated liver tests (T bili 8.53max) and CT scan showed intra/extra hepatic biliary dilation and  'suggestion of" a mass in pancreatic head. ERCP Dr. 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 MRI 2 days later showed NO pancreatic mass. EUS and repeat ERCP 11/2015 Dr. Ardis Hughs: previously placed metal stent was removed, no pancreatic or overt biliary masses noted, bile duct cannulated, CBD tapered smoothly and was only mildly dilated, biliary sphincterotomy performed and distal CBD mucosa was biopsied; stent cytology and bile duct biopsies both negative for neoplasm.  LFTs 11/2015 were normal.   This service was provided via virtual visit.Only audio was used.  The patient was located at [facility].  It sounds like some sort of a community home.  I was only able to reach her through the main number and then they tracked her down and put her on.  I was located in my office.  The patient did consent to this virtual visit and is aware of possible charges through their insurance for this visit.  I last saw her about 3 years ago.  She is here today to discuss a new problem.  My certified medical assistant, Grace Bushy, contributed to this visit by contacting the patient by phone 1 or 2 business days prior to the appointment and also followed up on the recommendations I made after the visit.  Time spent on virtual visit: 23 min   HPI: This is a very pleasant 84 year old woman whom I last saw about 3 years ago.  She has acid reflux, abd pain with eating, bloating, belching, sweating.  The abdominal pain is in her epigastrium, it is a full sensation.  These have been going on for about 6 months, she's been ignoring the  symptoms.  She has 'excessive gas' and her IBS is bothersome.  Her stomach hurts and her back hurts.   She takes prilosec, gas ex and tums, pepcid.  After she eats she feels full early.  She has no teeth.  Food gets to the middle of her stomach and 'just lays' there.  She is gaining weight  Chief complaint is bloating, epigastric abdominal pain  Labs 06/2018: CBC and complete metabolic profile were normal except for platelets of 144,000   Colonoscopy September 2003, Dr. Knox Saliva from [facility].  Normal except for 1 diminutive polyp which was removed by hot biopsy forceps, pathology showed this was adenomatous.   ROS: complete GI ROS as described in HPI, all other review negative.  Constitutional:  No unintentional weight loss   Past Medical History:  Diagnosis Date  . Anxiety   . Arthritis   . Depression   . Falls frequently   . Fibromyalgia   . Fracture, clavicle   . GERD (gastroesophageal reflux disease)   . History of fractured vertebra   . Hypertension   . Hypothyroidism   . Seasonal allergies   . Thyroid disease   . Vertigo   . Vitamin A deficiency   . Wrist fracture, right     Past Surgical History:  Procedure Laterality Date  . ABDOMINAL HYSTERECTOMY    . BACK SURGERY    . CHOLECYSTECTOMY    . ERCP N/A 10/22/2015   Procedure: ENDOSCOPIC RETROGRADE CHOLANGIOPANCREATOGRAPHY (ERCP);  Surgeon: Milus Banister, MD;  Location: [facility];  Service: Endoscopy;  Laterality: N/A;  . ERCP N/A 11/25/2015   Procedure: ENDOSCOPIC RETROGRADE CHOLANGIOPANCREATOGRAPHY (ERCP);  Surgeon: Milus Banister, MD;  Location: [facility];  Service: Endoscopy;  Laterality: N/A;  . EUS N/A 11/25/2015   Procedure: ESOPHAGEAL ENDOSCOPIC ULTRASOUND (EUS) RADIAL;  Surgeon: Milus Banister, MD;  Location: [facility];  Service: Endoscopy;  Laterality: N/A;  . FRACTURE SURGERY    . THYROIDECTOMY    . WRIST SURGERY      Current Outpatient Medications  Medication  Sig Dispense Refill  . acetaminophen (TYLENOL) 500 MG tablet Take 500 mg by mouth 2 (two) times daily.    . Alcaftadine (LASTACAFT) 0.25 % SOLN Place 1 drop into both eyes daily.     Marland Kitchen antiseptic oral rinse (BIOTENE) LIQD Swish and spit 15 mLs 3 (three) times daily.     Marland Kitchen azelastine (OPTIVAR) 0.05 % ophthalmic solution Place 1 drop into both eyes 2 (two) times daily.    . benzonatate (TESSALON) 100 MG capsule Take 1 capsule (100 mg total) by mouth every 8 (eight) hours. 21 capsule 0  . busPIRone (BUSPAR) 15 MG tablet Take 22.5 mg by mouth 2 (two) times daily.    . calcium-vitamin D (OSCAL WITH D) 500-200 MG-UNIT tablet Take 1 tablet by mouth daily.    . fluorometholone (FML) 0.1 % ophthalmic suspension Place 1 drop into both eyes as directed. Starting 06/01/16 instill 1 drop both eyes 4 times daily for 1 week, then 1 drop both eyes 3 times daily for 1 week, then 1 drop both eyes twice daily for 1 week    . fluticasone (FLONASE) 50 MCG/ACT nasal spray Place 1 spray into both nostrils 2 (two) times daily.     Marland Kitchen guaiFENesin (MUCINEX) 600 MG 12 hr tablet Take 1 tablet (600 mg total) by mouth 2 (two) times daily. 14 tablet 0  . HYDROcodone-acetaminophen (NORCO/VICODIN) 5-325 MG tablet Take 1 tablet by mouth 2 (two) times daily.     . hydrOXYzine (VISTARIL) 25 MG capsule Take 25 mg by mouth 3 (three) times daily.     Candace Gallus Care Products (JOHNSONS BABY SHAMPOO EX) Apply topically. Dilute a pea size amount with water; close right eye, wash and rinse. Repeat twice daily for 2 days prior to injection.    Marland Kitchen levothyroxine (SYNTHROID, LEVOTHROID) 88 MCG tablet Take 88 mcg by mouth daily before breakfast.     . loperamide (IMODIUM) 2 MG capsule Take 2 mg by mouth daily as needed for diarrhea or loose stools. After each loose stool    . loratadine (CLARITIN) 10 MG tablet Take 10 mg by mouth daily.    Marland Kitchen LORazepam (ATIVAN) 0.5 MG tablet Take 0.5 mg by mouth daily at 12 noon.    . Multiple Vitamins-Minerals  (CERTAVITE SENIOR/ANTIOXIDANT PO) Take 1 tablet by mouth daily.    . Multiple Vitamins-Minerals (PRESERVISION AREDS) CAPS Take 1 capsule by mouth 2 (two) times daily.    Marland Kitchen omeprazole (PRILOSEC) 40 MG capsule Take 1 capsule (40 mg total) by mouth daily. 90 capsule 3  . ondansetron (ZOFRAN) 8 MG tablet Take 8 mg by mouth every 8 (eight) hours as needed for nausea or vomiting.    . sertraline (ZOLOFT) 50 MG tablet Take 75 mg by mouth daily.     . traZODone (DESYREL) 100 MG tablet Take 200 mg by mouth at bedtime.      No current facility-administered medications for this visit.     Allergies as  of 07/16/2018 - Review Complete 07/07/2018  Allergen Reaction Noted  . Codeine  07/20/2010  . Cortisone  07/20/2010  . Other  02/16/2013    Family History  Problem Relation Age of Onset  . Hypertension Other     Social History   Socioeconomic History  . Marital status: Divorced    Spouse name: Not on file  . Number of children: Not on file  . Years of education: Not on file  . Highest education level: Not on file  Occupational History  . Not on file  Social Needs  . Financial resource strain: Not on file  . Food insecurity:    Worry: Not on file    Inability: Not on file  . Transportation needs:    Medical: Not on file    Non-medical: Not on file  Tobacco Use  . Smoking status: Never Smoker  . Smokeless tobacco: Never Used  Substance and Sexual Activity  . Alcohol use: No  . Drug use: No  . Sexual activity: Not Currently  Lifestyle  . Physical activity:    Days per week: Not on file    Minutes per session: Not on file  . Stress: Not on file  Relationships  . Social connections:    Talks on phone: Not on file    Gets together: Not on file    Attends religious service: Not on file    Active member of club or organization: Not on file    Attends meetings of clubs or organizations: Not on file    Relationship status: Not on file  . Intimate partner violence:    Fear of  current or ex partner: Not on file    Emotionally abused: Not on file    Physically abused: Not on file    Forced sexual activity: Not on file  Other Topics Concern  . Not on file  Social History Narrative  . Not on file     Physical Exam: Unable to perform because this was a "telemed visit" due to current Covid-19 pandemic  Assessment and plan: 84 y.o. female with epigastric abdominal pain after eating, bloating, acid reflux  She has numerous upper GI symptoms.  Fortunately she has been gaining weight.  Unclear etiology however she thinks some of it is acid related and certainly that may be the case.  We will call in a new dosing regimen for her proton pump inhibitor so that she will start taking omeprazole 40 mg twice daily instead of once daily.  We will also start her work-up with a CT scan abdomen pelvis with IV and oral contrast  Please see the "Patient Instructions" section for addition details about the plan.  Owens Loffler, MD [facility] 07/16/2018, 1:16 PM

## 2018-08-01 ENCOUNTER — Other Ambulatory Visit: Payer: Medicare Other

## 2018-08-14 ENCOUNTER — Ambulatory Visit
Admission: RE | Admit: 2018-08-14 | Discharge: 2018-08-14 | Disposition: A | Discharge status: Home / Self Care | Payer: Medicare Other | Source: Ambulatory Visit | Attending: Gastroenterology | Admitting: Gastroenterology

## 2018-08-14 DIAGNOSIS — R109 Unspecified abdominal pain: Secondary | ICD-10-CM

## 2018-08-14 DIAGNOSIS — R140 Abdominal distension (gaseous): Secondary | ICD-10-CM

## 2018-08-14 MED ORDER — IOPAMIDOL (ISOVUE-300) INJECTION 61%
100.0000 mL | Freq: Once | INTRAVENOUS | Status: AC | PRN
  Administered 2018-08-14: 100 mL via INTRAVENOUS

## 2018-09-20 ENCOUNTER — Encounter: Payer: Medicare Other | Admitting: Gastroenterology

## 2018-09-20 ENCOUNTER — Encounter: Payer: Self-pay | Admitting: Gastroenterology

## 2018-09-20 NOTE — Progress Notes (Signed)
Review of pertinent gastrointestinal problems: 1.Common bile duct dilation (etiology unknown... Overall most suspicious for papillary stenosis) seen on CT scan9/2017; presented with abd pain, elevated liver tests(T bili 8.67max) and CT scan showed intra/extra hepatic biliary dilation and 'suggestion of" a mass in pancreatic head. ERCP Dr. 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 MRI 2 days later showed NO pancreatic mass. EUS and repeat ERCP 11/2015 Dr. Ardis Hughs: previously placed metal stent was removed, no pancreatic or overt biliary masses noted, bile duct cannulated, CBD tapered smoothly and was only mildly dilated, biliary sphincterotomy performed and distal CBD mucosa was biopsied; stent cytology and bile duct biopsies both negative for neoplasm.LFTs 11/2015 were normal. 2. Colonoscopy September 2003, Dr. Knox Saliva from [facility].  Normal except for 1 diminutive polyp which was removed by hot biopsy forceps, pathology showed this was adenomatous.    I spent several minutes preparing her chart for this telemedicine visit today.  I called her listed phone number at [facility].  I tried reaching her through the "supervisor on duty" however there was no answer at that extension.  I then tried contacting her through the "care coordinator" and there was also no answer there.  I ended up leaving a message on the patient care coordinator voicemail system to asked Ms. Ashton to call back to reschedule this appointment.

## 2018-11-07 NOTE — Progress Notes (Signed)
This encounter was created in error - please disregard.

## 2018-11-23 ENCOUNTER — Emergency Department (HOSPITAL_COMMUNITY): Payer: Medicare Other

## 2018-11-23 ENCOUNTER — Encounter (HOSPITAL_COMMUNITY): Payer: Self-pay

## 2018-11-23 ENCOUNTER — Emergency Department (HOSPITAL_COMMUNITY)
Admission: EM | Admit: 2018-11-23 | Discharge: 2018-11-23 | Disposition: A | Discharge status: Home / Self Care | Payer: Medicare Other | Attending: Emergency Medicine | Admitting: Emergency Medicine

## 2018-11-23 ENCOUNTER — Other Ambulatory Visit: Payer: Self-pay

## 2018-11-23 DIAGNOSIS — F0390 Unspecified dementia without behavioral disturbance: Secondary | ICD-10-CM | POA: Diagnosis not present

## 2018-11-23 DIAGNOSIS — E039 Hypothyroidism, unspecified: Secondary | ICD-10-CM | POA: Insufficient documentation

## 2018-11-23 DIAGNOSIS — I10 Essential (primary) hypertension: Secondary | ICD-10-CM | POA: Insufficient documentation

## 2018-11-23 DIAGNOSIS — R569 Unspecified convulsions: Secondary | ICD-10-CM | POA: Insufficient documentation

## 2018-11-23 DIAGNOSIS — Z79899 Other long term (current) drug therapy: Secondary | ICD-10-CM | POA: Insufficient documentation

## 2018-11-23 LAB — URINALYSIS, ROUTINE W REFLEX MICROSCOPIC
Bacteria, UA: NONE SEEN
Bilirubin Urine: NEGATIVE
Glucose, UA: NEGATIVE mg/dL
Hgb urine dipstick: NEGATIVE
Ketones, ur: NEGATIVE mg/dL
Nitrite: NEGATIVE
Protein, ur: 30 mg/dL — AB
Specific Gravity, Urine: 1.018 (ref 1.005–1.030)
pH: 6 (ref 5.0–8.0)

## 2018-11-23 LAB — CBC WITH DIFFERENTIAL/PLATELET
Abs Immature Granulocytes: 0.02 10*3/uL (ref 0.00–0.07)
Basophils Absolute: 0 10*3/uL (ref 0.0–0.1)
Basophils Relative: 0 %
Eosinophils Absolute: 0 10*3/uL (ref 0.0–0.5)
Eosinophils Relative: 0 %
HCT: 39.1 % (ref 36.0–46.0)
Hemoglobin: 13.5 g/dL (ref 12.0–15.0)
Immature Granulocytes: 0 %
Lymphocytes Relative: 11 %
Lymphs Abs: 0.8 10*3/uL (ref 0.7–4.0)
MCH: 30.7 pg (ref 26.0–34.0)
MCHC: 34.5 g/dL (ref 30.0–36.0)
MCV: 88.9 fL (ref 80.0–100.0)
Monocytes Absolute: 0.5 10*3/uL (ref 0.1–1.0)
Monocytes Relative: 7 %
Neutro Abs: 6.1 10*3/uL (ref 1.7–7.7)
Neutrophils Relative %: 82 %
Platelets: 151 10*3/uL (ref 150–400)
RBC: 4.4 MIL/uL (ref 3.87–5.11)
RDW: 12.7 % (ref 11.5–15.5)
WBC: 7.5 10*3/uL (ref 4.0–10.5)
nRBC: 0 % (ref 0.0–0.2)

## 2018-11-23 LAB — BASIC METABOLIC PANEL
Anion gap: 9 (ref 5–15)
BUN: 8 mg/dL (ref 8–23)
CO2: 27 mmol/L (ref 22–32)
Calcium: 8.7 mg/dL — ABNORMAL LOW (ref 8.9–10.3)
Chloride: 97 mmol/L — ABNORMAL LOW (ref 98–111)
Creatinine, Ser: 1.16 mg/dL — ABNORMAL HIGH (ref 0.44–1.00)
Glucose, Bld: 134 mg/dL — ABNORMAL HIGH (ref 70–99)
Potassium: 3.9 mmol/L (ref 3.5–5.1)
Sodium: 133 mmol/L — ABNORMAL LOW (ref 135–145)

## 2018-11-23 MED ORDER — ACETAMINOPHEN 325 MG PO TABS
650.0000 mg | ORAL_TABLET | Freq: Once | ORAL | Status: AC
  Administered 2018-11-23: 650 mg via ORAL
  Filled 2018-11-23: qty 2

## 2018-11-23 NOTE — ED Notes (Signed)
Patient verbalizes understanding of discharge instructions. Opportunity for questioning and answers were provided. Armband removed by staff, pt discharged from ED.  

## 2018-11-23 NOTE — ED Triage Notes (Signed)
Pt bib [facility] from [facility] after pt was being assisted to the bathroom and had a "possible seizure" per nursing home staff. Pt experienced tremors and was assisted to the floor. No LOC per EMS. Pt does not have hx of seizures. Pt has dementia hx, however, is AOx4 on arrival.

## 2018-11-23 NOTE — ED Provider Notes (Signed)
[facility] EMERGENCY DEPARTMENT Provider Note   CSN: [id number] Arrival date & time: 11/23/18  0327     History   Chief Complaint Chief Complaint  Patient presents with  . Seizures   Level 5 caveat due to dementia HPI Erika Conley is a 84 y.o. female.     The history is provided by the EMS personnel, the patient and the nursing home.  Seizures Seizure activity on arrival: no   Severity:  Moderate Patient with history of anxiety, dementia, hypertension presents with possible seizure.  Patient resides at a local nursing home.  They reported a possible seizure and called EMS.  EMS evaluated patient and felt that it was tremors.  No traumatic falls reported.  Patient without history of previous seizures.  Patient is currently awake and alert.  She reports recent urinary frequency.  No other acute complaints  Past Medical History:  Diagnosis Date  . Anxiety   . Arthritis   . Depression   . Falls frequently   . Fibromyalgia   . Fracture, clavicle   . GERD (gastroesophageal reflux disease)   . History of fractured vertebra   . Hypertension   . Hypothyroidism   . Seasonal allergies   . Thyroid disease   . Vertigo   . Vitamin A deficiency   . Wrist fracture, right     Patient Active Problem List   Diagnosis Date Noted  . Nausea and vomiting 12/14/2015  . Hyponatremia 12/13/2015  . Obstructive jaundice   . Bile duct stricture   . Pancreatitis 10/23/2015  . Abdominal pain 10/22/2015  . Pancreatic mass 10/22/2015  . Common bile duct (CBD) obstruction   . Abnormal LFTs   . Dehydration with hyponatremia 04/09/2015  . Acute respiratory failure with hypoxia ([place]) 04/09/2015  . Thrombocytopenia ([place]) 04/09/2015  . Hypokalemia 11/10/2014  . Hypertension 11/10/2014  . Hypothyroidism 11/10/2014  . Syncope due to orthostatic hypotension 11/10/2014  . Constipated   . UTI (urinary tract infection) 06/04/2013  . Altered mental status 06/04/2013     Past Surgical History:  Procedure Laterality Date  . ABDOMINAL HYSTERECTOMY    . BACK SURGERY    . CHOLECYSTECTOMY    . ERCP N/A 10/22/2015   Procedure: ENDOSCOPIC RETROGRADE CHOLANGIOPANCREATOGRAPHY (ERCP);  Surgeon: Milus Banister, MD;  Location: [facility];  Service: Endoscopy;  Laterality: N/A;  . ERCP N/A 11/25/2015   Procedure: ENDOSCOPIC RETROGRADE CHOLANGIOPANCREATOGRAPHY (ERCP);  Surgeon: Milus Banister, MD;  Location: [facility];  Service: Endoscopy;  Laterality: N/A;  . EUS N/A 11/25/2015   Procedure: ESOPHAGEAL ENDOSCOPIC ULTRASOUND (EUS) RADIAL;  Surgeon: Milus Banister, MD;  Location: [facility];  Service: Endoscopy;  Laterality: N/A;  . FRACTURE SURGERY    . THYROIDECTOMY    . WRIST SURGERY       OB History   No obstetric history on file.      Home Medications    Prior to Admission medications   Medication Sig Start Date End Date Taking? Authorizing Provider  acetaminophen (TYLENOL) 500 MG tablet Take 500 mg by mouth 2 (two) times daily.    [provider]  Alcaftadine (LASTACAFT) 0.25 % SOLN Place 1 drop into both eyes daily.     [provider]  antiseptic oral rinse (BIOTENE) LIQD Swish and spit 15 mLs 3 (three) times daily.     [provider]  azelastine (OPTIVAR) 0.05 % ophthalmic solution Place 1 drop into both eyes 2 (two) times daily.  [provider]  benzonatate (TESSALON) 100 MG capsule Take 1 capsule (100 mg total) by mouth every 8 (eight) hours. 02/03/18   Dorie Rank, MD  busPIRone (BUSPAR) 15 MG tablet Take 22.5 mg by mouth 2 (two) times daily.    [provider]  calcium-vitamin D (OSCAL WITH D) 500-200 MG-UNIT tablet Take 1 tablet by mouth daily.    [provider]  fluorometholone (FML) 0.1 % ophthalmic suspension Place 1 drop into both eyes as directed. Starting 06/01/16 instill 1 drop both eyes 4 times daily for 1 week, then 1 drop both eyes 3 times daily for 1 week, then 1 drop both eyes  twice daily for 1 week    [provider]  fluticasone (FLONASE) 50 MCG/ACT nasal spray Place 1 spray into both nostrils 2 (two) times daily.     [provider]  guaiFENesin (MUCINEX) 600 MG 12 hr tablet Take 1 tablet (600 mg total) by mouth 2 (two) times daily. 02/03/18   Dorie Rank, MD  HYDROcodone-acetaminophen (NORCO/VICODIN) 5-325 MG tablet Take 1 tablet by mouth 2 (two) times daily.     [provider]  hydrOXYzine (VISTARIL) 25 MG capsule Take 25 mg by mouth 3 (three) times daily.     [provider]  Infant Care Products (JOHNSONS BABY SHAMPOO EX) Apply topically. Dilute a pea size amount with water; close right eye, wash and rinse. Repeat twice daily for 2 days prior to injection.    [provider]  levothyroxine (SYNTHROID, LEVOTHROID) 88 MCG tablet Take 88 mcg by mouth daily before breakfast.     [provider]  loperamide (IMODIUM) 2 MG capsule Take 2 mg by mouth daily as needed for diarrhea or loose stools. After each loose stool    [provider]  loratadine (CLARITIN) 10 MG tablet Take 10 mg by mouth daily.    [provider]  LORazepam (ATIVAN) 0.5 MG tablet Take 0.5 mg by mouth daily at 12 noon.    [provider]  Multiple Vitamins-Minerals (CERTAVITE SENIOR/ANTIOXIDANT PO) Take 1 tablet by mouth daily.    [provider]  Multiple Vitamins-Minerals (PRESERVISION AREDS) CAPS Take 1 capsule by mouth 2 (two) times daily.    [provider]  omeprazole (PRILOSEC) 40 MG capsule Take 1 capsule (40 mg total) by mouth daily. 01/25/16   Milus Banister, MD  omeprazole (PRILOSEC) 40 MG capsule Take 1 capsule (40 mg total) by mouth 2 (two) times daily. Take shortly before breakfast and dinner meal 07/16/18   Milus Banister, MD  ondansetron (ZOFRAN) 8 MG tablet Take 8 mg by mouth every 8 (eight) hours as needed for nausea or vomiting.    [provider]  sertraline (ZOLOFT) 50 MG  tablet Take 75 mg by mouth daily.     [provider]  traZODone (DESYREL) 100 MG tablet Take 200 mg by mouth at bedtime.     [provider]    Family History Family History  Problem Relation Age of Onset  . Hypertension Other     Social History Social History   Tobacco Use  . Smoking status: Never Smoker  . Smokeless tobacco: Never Used  Substance Use Topics  . Alcohol use: No  . Drug use: No     Allergies   Codeine, Cortisone, and Other   Review of Systems Review of Systems  Unable to perform ROS: Dementia  Neurological: Positive for seizures.     Physical Exam Updated Vital  Signs BP (!) 122/51 (BP Location: Right Arm)   Pulse 60   Temp 98.7 F (37.1 C) (Oral)   Resp 18   SpO2 90%   Physical Exam CONSTITUTIONAL: Elderly and frail HEAD: Normocephalic/atraumatic EYES: EOMI/PERRL ENMT: Mucous membranes moist, edentulous NECK: supple no meningeal signs SPINE/BACK: Kyphotic spine CV: S1/S2 noted, no murmurs/rubs/gallops noted LUNGS: Lungs are clear to auscultation bilaterally, no apparent distress ABDOMEN: soft, nontender, no rebound or guarding, bowel sounds noted throughout abdomen GU:no cva tenderness NEURO: Pt is awake/alert moves all extremitiesx4.  No facial droop.  No arm or leg drift.  Patient appears mildly confused but overall no distress EXTREMITIES: pulses normal/equal, full ROM, no signs of trauma SKIN: warm, color normal  ED Treatments / Results  Labs (all labs ordered are listed, but only abnormal results are displayed) Labs Reviewed  BASIC METABOLIC PANEL - Abnormal; Notable for the following components:      Result Value   Sodium 133 (*)    Chloride 97 (*)    Glucose, Bld 134 (*)    Creatinine, Ser 1.16 (*)    Calcium 8.7 (*)    All other components within normal limits  URINALYSIS, ROUTINE W REFLEX MICROSCOPIC - Abnormal; Notable for the following components:   APPearance HAZY (*)    Protein, ur 30 (*)     Leukocytes,Ua SMALL (*)    All other components within normal limits  CBC WITH DIFFERENTIAL/PLATELET    EKG EKG Interpretation  Date/Time:  Saturday November 23 2018 03:30:58 EDT Ventricular Rate:  61 PR Interval:    QRS Duration: 94 QT Interval:  448 QTC Calculation: 452 R Axis:   -3 Text Interpretation:  Sinus rhythm Low voltage, precordial leads Borderline T wave abnormalities No significant change since last tracing Confirmed by Ripley Fraise [phone]) on 11/23/2018 3:53:17 AM   Radiology Ct Head Wo Contrast  Result Date: 11/23/2018 CLINICAL DATA:  Possible seizures EXAM: CT HEAD WITHOUT CONTRAST TECHNIQUE: Contiguous axial images were obtained from the base of the skull through the vertex without intravenous contrast. COMPARISON:  02/10/2015 FINDINGS: Brain: No evidence of acute infarction, hemorrhage, extra-axial collection, ventriculomegaly, or mass effect. Generalized cerebral atrophy. Periventricular white matter low attenuation likely secondary to microangiopathy. Vascular: Cerebrovascular atherosclerotic calcifications are noted. Skull: Negative for fracture or focal lesion. Sinuses/Orbits: Visualized portions of the orbits are unremarkable. Visualized portions of the paranasal sinuses and mastoid air cells are unremarkable. Other: None. IMPRESSION: No acute intracranial pathology. Electronically Signed   By: Kathreen Devoid   On: 11/23/2018 06:09   Dg Chest Port 1 View  Result Date: 11/23/2018 CLINICAL DATA:  Weakness EXAM: PORTABLE CHEST 1 VIEW COMPARISON:  07/07/2018 FINDINGS: Low lung volumes. No consolidation or effusion. Stable cardiomediastinal silhouette with aortic atherosclerosis. No pneumothorax. IMPRESSION: No active disease.  Low lung volumes. Electronically Signed   By: Donavan Foil M.D.   On: 11/23/2018 03:58    Procedures Procedures  Medications Ordered in ED Medications - No data to display   Initial Impression / Assessment and Plan / ED Course  I have  reviewed the triage vital signs and the nursing notes.  Pertinent labs & imaging results that were available during my care of the patient were reviewed by me and considered in my medical decision making (see chart for details).        4:44 AM Patient presents from local nursing home ([facility]) For concern for seizure.  I discussed with the nursing facility staff.  They report patient was found  on the toilet with tremors/seizure activity.  No traumatic falls reported They report she appeared altered afterwards.  She had otherwise been at her baseline recently.  No known history of seizure per nursing staff  I spoke to the son via phone.  He reports patient has had a similar episode in the past. At this time patient is awake alert with mild confusion Plan to obtain labs and imaging.  If negative patient to be discharged back to facility 6:28 AM Work-up in the ER was unrevealing.  CT head negative.  Labs reassuring.  Patient is awake/alert this time.  She does report mild headache. BP (!) 126/48   Pulse 63   Temp 98.7 F (37.1 C) (Oral)   Resp 19   SpO2 97%  At this point I feel she is appropriate for discharge back to nursing facility Patient appears to be at her baseline mental status Final Clinical Impressions(s) / ED Diagnoses   Final diagnoses:  Seizure-like activity [facility])    ED Discharge Orders    None       Ripley Fraise, MD 11/23/18 [phone]

## 2018-11-23 NOTE — ED Notes (Signed)
[facility]AR CALLED  °

## 2018-12-07 ENCOUNTER — Other Ambulatory Visit: Payer: Self-pay

## 2018-12-07 ENCOUNTER — Encounter (HOSPITAL_COMMUNITY): Payer: Self-pay | Admitting: Emergency Medicine

## 2018-12-07 ENCOUNTER — Inpatient Hospital Stay (HOSPITAL_COMMUNITY)
Admission: EM | Admit: 2018-12-07 | Discharge: 2018-12-12 | DRG: 312 | Disposition: A | Discharge status: Home Health | Payer: Medicare Other | Source: Skilled Nursing Facility | Attending: Internal Medicine | Admitting: Internal Medicine

## 2018-12-07 DIAGNOSIS — Z90710 Acquired absence of both cervix and uterus: Secondary | ICD-10-CM

## 2018-12-07 DIAGNOSIS — R5381 Other malaise: Secondary | ICD-10-CM | POA: Diagnosis present

## 2018-12-07 DIAGNOSIS — R296 Repeated falls: Secondary | ICD-10-CM | POA: Diagnosis present

## 2018-12-07 DIAGNOSIS — R001 Bradycardia, unspecified: Secondary | ICD-10-CM | POA: Diagnosis present

## 2018-12-07 DIAGNOSIS — M797 Fibromyalgia: Secondary | ICD-10-CM | POA: Diagnosis present

## 2018-12-07 DIAGNOSIS — Z8249 Family history of ischemic heart disease and other diseases of the circulatory system: Secondary | ICD-10-CM

## 2018-12-07 DIAGNOSIS — I951 Orthostatic hypotension: Secondary | ICD-10-CM | POA: Diagnosis not present

## 2018-12-07 DIAGNOSIS — Z993 Dependence on wheelchair: Secondary | ICD-10-CM

## 2018-12-07 DIAGNOSIS — I440 Atrioventricular block, first degree: Secondary | ICD-10-CM | POA: Diagnosis present

## 2018-12-07 DIAGNOSIS — K219 Gastro-esophageal reflux disease without esophagitis: Secondary | ICD-10-CM | POA: Diagnosis present

## 2018-12-07 DIAGNOSIS — J302 Other seasonal allergic rhinitis: Secondary | ICD-10-CM | POA: Diagnosis present

## 2018-12-07 DIAGNOSIS — Z66 Do not resuscitate: Secondary | ICD-10-CM | POA: Diagnosis present

## 2018-12-07 DIAGNOSIS — F329 Major depressive disorder, single episode, unspecified: Secondary | ICD-10-CM | POA: Diagnosis present

## 2018-12-07 DIAGNOSIS — F0390 Unspecified dementia without behavioral disturbance: Secondary | ICD-10-CM | POA: Diagnosis present

## 2018-12-07 DIAGNOSIS — Z20828 Contact with and (suspected) exposure to other viral communicable diseases: Secondary | ICD-10-CM | POA: Diagnosis present

## 2018-12-07 DIAGNOSIS — F419 Anxiety disorder, unspecified: Secondary | ICD-10-CM | POA: Diagnosis present

## 2018-12-07 DIAGNOSIS — R55 Syncope and collapse: Secondary | ICD-10-CM | POA: Diagnosis not present

## 2018-12-07 DIAGNOSIS — Z7951 Long term (current) use of inhaled steroids: Secondary | ICD-10-CM

## 2018-12-07 DIAGNOSIS — E039 Hypothyroidism, unspecified: Secondary | ICD-10-CM | POA: Diagnosis present

## 2018-12-07 DIAGNOSIS — E890 Postprocedural hypothyroidism: Secondary | ICD-10-CM | POA: Diagnosis present

## 2018-12-07 DIAGNOSIS — K580 Irritable bowel syndrome with diarrhea: Secondary | ICD-10-CM | POA: Diagnosis present

## 2018-12-07 DIAGNOSIS — Z79899 Other long term (current) drug therapy: Secondary | ICD-10-CM

## 2018-12-07 DIAGNOSIS — Z79890 Hormone replacement therapy (postmenopausal): Secondary | ICD-10-CM

## 2018-12-07 DIAGNOSIS — M47812 Spondylosis without myelopathy or radiculopathy, cervical region: Secondary | ICD-10-CM | POA: Diagnosis present

## 2018-12-07 DIAGNOSIS — Z9089 Acquired absence of other organs: Secondary | ICD-10-CM

## 2018-12-07 DIAGNOSIS — Z8679 Personal history of other diseases of the circulatory system: Secondary | ICD-10-CM

## 2018-12-07 DIAGNOSIS — Z8719 Personal history of other diseases of the digestive system: Secondary | ICD-10-CM

## 2018-12-07 DIAGNOSIS — H8110 Benign paroxysmal vertigo, unspecified ear: Secondary | ICD-10-CM | POA: Diagnosis present

## 2018-12-07 DIAGNOSIS — Z885 Allergy status to narcotic agent status: Secondary | ICD-10-CM

## 2018-12-07 DIAGNOSIS — Z9181 History of falling: Secondary | ICD-10-CM

## 2018-12-07 DIAGNOSIS — Z9049 Acquired absence of other specified parts of digestive tract: Secondary | ICD-10-CM

## 2018-12-07 DIAGNOSIS — Z888 Allergy status to other drugs, medicaments and biological substances status: Secondary | ICD-10-CM

## 2018-12-07 DIAGNOSIS — I340 Nonrheumatic mitral (valve) insufficiency: Secondary | ICD-10-CM | POA: Diagnosis present

## 2018-12-07 DIAGNOSIS — I10 Essential (primary) hypertension: Secondary | ICD-10-CM | POA: Diagnosis present

## 2018-12-07 NOTE — ED Triage Notes (Signed)
Pt presents to ED from [facility]. Pt c/o syncopal episode x2 after urination and bowel movement. Pt also complains of nausea and abd pain that is chronic. Per EMS pt brady (47 HR) and 87% on RA. Placed on 3L [place] 97%, no Resp distress.

## 2018-12-07 NOTE — ED Provider Notes (Signed)
Emergency Department Provider Note   I have reviewed the triage vital signs and the nursing notes.   HISTORY  Chief Complaint Loss of Consciousness   HPI Erika Conley is a 84 y.o. female with multiple medical problems document below who presents the emergency department today for a recurrent episode of syncope.  Patient had a near syncopal episode in May and  another period of unresponsiveness 2 weeks ago.  Happened again today.  All 3 of these episodes have been when she was on the commode.  She states that happens when she stands up and then she just backs down and syncopized.  Every time is on that she is bradycardic on EMS arrival.  One time she was hypotensive today she was normotensive.  Reported room air saturation of 87% so she was placed on oxygen.  She had no abnormal movements this time.  She did not fall off the commode.  She not have any pain right now.  She does relate having a episode of left-sided chest pain yesterday.  She also states that she has generalized abdominal pain that is not new for her has consistent with her previous episodes of gastritis. No other associated or modifying symptoms.    Past Medical History:  Diagnosis Date  . Anxiety   . Arthritis   . Depression   . Falls frequently   . Fibromyalgia   . Fracture, clavicle   . GERD (gastroesophageal reflux disease)   . History of fractured vertebra   . Hypertension   . Hypothyroidism   . Seasonal allergies   . Thyroid disease   . Vertigo   . Vitamin A deficiency   . Wrist fracture, right     Patient Active Problem List   Diagnosis Date Noted  . Nausea and vomiting 12/14/2015  . Hyponatremia 12/13/2015  . Obstructive jaundice   . Bile duct stricture   . Pancreatitis 10/23/2015  . Abdominal pain 10/22/2015  . Pancreatic mass 10/22/2015  . Common bile duct (CBD) obstruction   . Abnormal LFTs   . Dehydration with hyponatremia 04/09/2015  . Acute respiratory failure with hypoxia ([place])  04/09/2015  . Thrombocytopenia ([place]) 04/09/2015  . Hypokalemia 11/10/2014  . Hypertension 11/10/2014  . Hypothyroidism 11/10/2014  . Syncope due to orthostatic hypotension 11/10/2014  . Constipated   . UTI (urinary tract infection) 06/04/2013  . Altered mental status 06/04/2013    Past Surgical History:  Procedure Laterality Date  . ABDOMINAL HYSTERECTOMY    . BACK SURGERY    . CHOLECYSTECTOMY    . ERCP N/A 10/22/2015   Procedure: ENDOSCOPIC RETROGRADE CHOLANGIOPANCREATOGRAPHY (ERCP);  Surgeon: Milus Banister, MD;  Location: [facility];  Service: Endoscopy;  Laterality: N/A;  . ERCP N/A 11/25/2015   Procedure: ENDOSCOPIC RETROGRADE CHOLANGIOPANCREATOGRAPHY (ERCP);  Surgeon: Milus Banister, MD;  Location: [facility];  Service: Endoscopy;  Laterality: N/A;  . EUS N/A 11/25/2015   Procedure: ESOPHAGEAL ENDOSCOPIC ULTRASOUND (EUS) RADIAL;  Surgeon: Milus Banister, MD;  Location: [facility];  Service: Endoscopy;  Laterality: N/A;  . FRACTURE SURGERY    . THYROIDECTOMY    . WRIST SURGERY      Current Outpatient Rx  . Order #: [id number] Class: Historical Med  . Order #: [id number] Class: Historical Med  . Order #: [id number] Class: Historical Med  . Order #: [id number] Class: Historical Med  . Order #: [id number] Class: Historical Med  . Order #: [id number] Class: Historical Med  . Order #: [id number] Class: Historical Med  . Order #: [id number] Class:  Historical Med  . Order #: [id number] Class: Print  . Order #: [id number] Class: Historical Med  . Order #: [id number] Class: Historical Med  . Order #: [id number] Class: Historical Med  . Order #: [id number] Class: Historical Med  . Order #: [id number] Class: Historical Med  . Order #: [id number] Class: Historical Med  . Order #: [id number] Class: Historical Med  . Order #: [id number] Class: Historical Med  . Order #: [id number] Class: Historical Med  . Order #: [id number] Class: Print  . Order #: [id number] Class: Print  . Order #: [id number] Class: Historical  Med  . Order #: [id number] Class: Historical Med  . Order #: [id number] Class: Historical Med    Allergies Codeine, Cortisone, and Other  Family History  Problem Relation Age of Onset  . Hypertension Other     Social History Social History   Tobacco Use  . Smoking status: Never Smoker  . Smokeless tobacco: Never Used  Substance Use Topics  . Alcohol use: No  . Drug use: No    Review of Systems  All other systems negative except as documented in the HPI. All pertinent positives and negatives as reviewed in the HPI. ____________________________________________   PHYSICAL EXAM:  VITAL SIGNS: ED Triage Vitals  Enc Vitals Group     BP --      Pulse --      Resp --      Temp 12/07/18 2344 97.8 F (36.6 C)     Temp Source 12/07/18 2344 Oral     SpO2 12/07/18 2340 (!) 87 %     Weight 12/07/18 2345 141 lb (64 kg)     Height 12/07/18 2345 5' (1.524 m)    Constitutional: Alert and oriented. Well appearing and in no acute distress. Eyes: Conjunctivae are normal. PERRL. EOMI. Head: Atraumatic. Nose: No congestion/rhinnorhea. Mouth/Throat: Mucous membranes are dry.  Oropharynx non-erythematous. Neck: No stridor.  No meningeal signs.   Cardiovascular: bradycardic rate, regular rhythm. Good peripheral circulation. Grossly normal heart sounds.   Respiratory: Normal respiratory effort.  No retractions. Lungs CTAB. Gastrointestinal: Soft and diffusely tender without focality. No distention.  Musculoskeletal: No lower extremity tenderness nor edema. No gross deformities of extremities. Neurologic:  Normal speech and language. No gross focal neurologic deficits are appreciated.  Skin:  Skin is warm, dry and intact. No rash noted.  ____________________________________________   LABS (all labs ordered are listed, but only abnormal results are displayed)  Labs Reviewed - No data to display ____________________________________________  EKG   EKG Interpretation  Date/Time:   Sunday December 08 2018 08:54:36 EDT Ventricular Rate:  67 PR Interval:    QRS Duration: 88 QT Interval:  429 QTC Calculation: 453 R Axis:   -37 Text Interpretation:  Sinus rhythm Borderline prolonged PR interval Left axis deviation Low voltage, precordial leads Consider anterior infarct No significant change since last tracing Confirmed by Isla Pence [id number]) on 12/08/2018 9:01:23 AM       ____________________________________________  RADIOLOGY  X-ray Chest Pa And Lateral  Result Date: 12/08/2018 CLINICAL DATA:  Initial evaluation for acute syncope. EXAM: CHEST - 2 VIEW COMPARISON:  Prior radiograph from 11/23/2018. FINDINGS: Mild cardiomegaly, stable. Mediastinal silhouette within normal limits. Lungs hypoinflated. Streaky bibasilar atelectatic changes. No focal infiltrates. No edema or pleural effusion. No pneumothorax. No acute osseous finding. Multiple chronic compression deformities with sequelae of prior vertebral augmentation noted about the thoracolumbar junction. Remote posttraumatic deformity noted at the proximal left humerus. IMPRESSION: 1. Low lung volumes with associated bibasilar atelectatic changes. 2. No other active cardiopulmonary disease. Electronically Signed  By: Jeannine Boga M.D.   On: 12/08/2018 05:50    ____________________________________________   PROCEDURES  Procedure(s) performed:   Procedures   ____________________________________________   INITIAL IMPRESSION / ASSESSMENT AND PLAN / ED COURSE  Mild bradycardia in a 29-year-old female with 3 episodes of syncope in the last 5 months.  Here she appears stable.  However patient is at high risk for this and has not been worked up for in the past.  Will admit to medicine for the same.     Pertinent labs & imaging results that were available during my care of the patient were reviewed by me and considered in my medical decision making (see chart for details).    ____________________________________________  FINAL CLINICAL IMPRESSION(S) / ED DIAGNOSES  Final diagnoses:  None     MEDICATIONS GIVEN DURING THIS VISIT:  Medications - No data to display   NEW OUTPATIENT MEDICATIONS STARTED DURING THIS VISIT:  New Prescriptions   No medications on file    Note:  This note was prepared with assistance of Dragon voice recognition software. Occasional wrong-word or sound-a-like substitutions may have occurred due to the inherent limitations of voice recognition software.   Aroura Vasudevan, Corene Cornea, MD 12/09/18 [phone]

## 2018-12-08 ENCOUNTER — Observation Stay (HOSPITAL_COMMUNITY): Payer: Medicare Other

## 2018-12-08 ENCOUNTER — Other Ambulatory Visit: Payer: Self-pay

## 2018-12-08 ENCOUNTER — Observation Stay (HOSPITAL_BASED_OUTPATIENT_CLINIC_OR_DEPARTMENT_OTHER): Payer: Medicare Other

## 2018-12-08 ENCOUNTER — Encounter (HOSPITAL_COMMUNITY): Payer: Self-pay | Admitting: Cardiology

## 2018-12-08 DIAGNOSIS — E039 Hypothyroidism, unspecified: Secondary | ICD-10-CM

## 2018-12-08 DIAGNOSIS — I10 Essential (primary) hypertension: Secondary | ICD-10-CM | POA: Diagnosis not present

## 2018-12-08 DIAGNOSIS — R55 Syncope and collapse: Secondary | ICD-10-CM | POA: Diagnosis not present

## 2018-12-08 DIAGNOSIS — R0602 Shortness of breath: Secondary | ICD-10-CM | POA: Diagnosis not present

## 2018-12-08 LAB — CBC WITH DIFFERENTIAL/PLATELET
Abs Immature Granulocytes: 0.01 10*3/uL (ref 0.00–0.07)
Basophils Absolute: 0.1 10*3/uL (ref 0.0–0.1)
Basophils Relative: 1 %
Eosinophils Absolute: 0 10*3/uL (ref 0.0–0.5)
Eosinophils Relative: 1 %
HCT: 38.6 % (ref 36.0–46.0)
Hemoglobin: 13.6 g/dL (ref 12.0–15.0)
Immature Granulocytes: 0 %
Lymphocytes Relative: 18 %
Lymphs Abs: 1.1 10*3/uL (ref 0.7–4.0)
MCH: 31 pg (ref 26.0–34.0)
MCHC: 35.2 g/dL (ref 30.0–36.0)
MCV: 87.9 fL (ref 80.0–100.0)
Monocytes Absolute: 0.6 10*3/uL (ref 0.1–1.0)
Monocytes Relative: 9 %
Neutro Abs: 4.4 10*3/uL (ref 1.7–7.7)
Neutrophils Relative %: 71 %
Platelets: 163 10*3/uL (ref 150–400)
RBC: 4.39 MIL/uL (ref 3.87–5.11)
RDW: 13 % (ref 11.5–15.5)
WBC: 6.2 10*3/uL (ref 4.0–10.5)
nRBC: 0 % (ref 0.0–0.2)

## 2018-12-08 LAB — COMPREHENSIVE METABOLIC PANEL
ALT: 11 U/L (ref 0–44)
AST: 18 U/L (ref 15–41)
Albumin: 3.5 g/dL (ref 3.5–5.0)
Alkaline Phosphatase: 46 U/L (ref 38–126)
Anion gap: 12 (ref 5–15)
BUN: 9 mg/dL (ref 8–23)
CO2: 29 mmol/L (ref 22–32)
Calcium: 8.8 mg/dL — ABNORMAL LOW (ref 8.9–10.3)
Chloride: 95 mmol/L — ABNORMAL LOW (ref 98–111)
Creatinine, Ser: 0.93 mg/dL (ref 0.44–1.00)
GFR calc Af Amer: >60 mL/min (ref >60)
GFR calc non Af Amer: 57 mL/min — ABNORMAL LOW (ref >60)
Glucose, Bld: 124 mg/dL — ABNORMAL HIGH (ref 70–99)
Potassium: 3.9 mmol/L (ref 3.5–5.1)
Sodium: 136 mmol/L (ref 135–145)
Total Bilirubin: 0.4 mg/dL (ref 0.3–1.2)
Total Protein: 5.5 g/dL — ABNORMAL LOW (ref 6.5–8.1)

## 2018-12-08 LAB — MAGNESIUM: Magnesium: 1.8 mg/dL (ref 1.7–2.4)

## 2018-12-08 LAB — TROPONIN I (HIGH SENSITIVITY)
Troponin I (High Sensitivity): <2 ng/L (ref <18)
Troponin I (High Sensitivity): 3 ng/L (ref <18)

## 2018-12-08 LAB — T4, FREE: Free T4: 1.04 ng/dL (ref 0.61–1.12)

## 2018-12-08 LAB — ECHOCARDIOGRAM COMPLETE
Height: 60 in
Weight: 2255.99 oz

## 2018-12-08 LAB — CBG MONITORING, ED
Glucose-Capillary: 101 mg/dL — ABNORMAL HIGH (ref 70–99)
Glucose-Capillary: 122 mg/dL — ABNORMAL HIGH (ref 70–99)

## 2018-12-08 LAB — D-DIMER, QUANTITATIVE: D-Dimer, Quant: <0.27 ug/mL-FEU (ref 0.00–0.50)

## 2018-12-08 LAB — TSH: TSH: 0.946 u[IU]/mL (ref 0.350–4.500)

## 2018-12-08 LAB — SARS CORONAVIRUS 2 (TAT 6-24 HRS): SARS Coronavirus 2: NEGATIVE

## 2018-12-08 MED ORDER — KETOTIFEN FUMARATE 0.025 % OP SOLN: 1.0000 [drp] | Freq: Two times a day (BID) | OPHTHALMIC | Status: DC | PRN

## 2018-12-08 MED ORDER — ARTIFICIAL TEARS OPHTHALMIC OINT
TOPICAL_OINTMENT | Freq: Every day | OPHTHALMIC | Status: DC
  Administered 2018-12-09 – 2018-12-11 (×3): via OPHTHALMIC
  Filled 2018-12-08: qty 3.5

## 2018-12-08 MED ORDER — LOPERAMIDE HCL 2 MG PO CAPS
2.0000 mg | ORAL_CAPSULE | Freq: Every day | ORAL | Status: DC | PRN
  Administered 2018-12-11: 2 mg via ORAL
  Filled 2018-12-08: qty 1

## 2018-12-08 MED ORDER — BUSPIRONE HCL 5 MG PO TABS
22.5000 mg | ORAL_TABLET | Freq: Two times a day (BID) | ORAL | Status: DC
  Administered 2018-12-08 – 2018-12-12 (×8): 22.5 mg via ORAL
  Filled 2018-12-08: qty 1
  Filled 2018-12-08: qty 2
  Filled 2018-12-08: qty 1
  Filled 2018-12-08: qty 2
  Filled 2018-12-08 (×6): qty 1

## 2018-12-08 MED ORDER — PRESERVISION AREDS PO CAPS: 1.0000 | ORAL_CAPSULE | Freq: Two times a day (BID) | ORAL | Status: DC

## 2018-12-08 MED ORDER — ACETAMINOPHEN 325 MG PO TABS
650.0000 mg | ORAL_TABLET | Freq: Four times a day (QID) | ORAL | Status: DC | PRN
  Administered 2018-12-08 – 2018-12-12 (×5): 650 mg via ORAL
  Filled 2018-12-08 (×6): qty 2

## 2018-12-08 MED ORDER — SERTRALINE HCL 100 MG PO TABS
100.0000 mg | ORAL_TABLET | Freq: Every day | ORAL | Status: DC
  Administered 2018-12-09 – 2018-12-12 (×4): 100 mg via ORAL
  Filled 2018-12-08 (×5): qty 1

## 2018-12-08 MED ORDER — CALCIUM CARBONATE-VITAMIN D 500-200 MG-UNIT PO TABS
1.0000 | ORAL_TABLET | Freq: Every day | ORAL | Status: DC
  Administered 2018-12-08 – 2018-12-12 (×5): 1 via ORAL
  Filled 2018-12-08 (×5): qty 1

## 2018-12-08 MED ORDER — ONDANSETRON HCL 4 MG PO TABS
4.0000 mg | ORAL_TABLET | Freq: Four times a day (QID) | ORAL | Status: DC | PRN
  Administered 2018-12-11 – 2018-12-12 (×4): 4 mg via ORAL
  Filled 2018-12-08 (×5): qty 1

## 2018-12-08 MED ORDER — LEVOTHYROXINE SODIUM 100 MCG PO TABS
100.0000 ug | ORAL_TABLET | Freq: Every day | ORAL | Status: DC
  Administered 2018-12-08 – 2018-12-12 (×5): 100 ug via ORAL
  Filled 2018-12-08 (×5): qty 1

## 2018-12-08 MED ORDER — ADULT MULTIVITAMIN W/MINERALS CH
1.0000 | ORAL_TABLET | Freq: Every day | ORAL | Status: DC
  Administered 2018-12-08 – 2018-12-12 (×5): 1 via ORAL
  Filled 2018-12-08 (×5): qty 1

## 2018-12-08 MED ORDER — LORAZEPAM 0.5 MG PO TABS
0.5000 mg | ORAL_TABLET | Freq: Every evening | ORAL | Status: DC
  Administered 2018-12-08 – 2018-12-12 (×5): 0.5 mg via ORAL
  Filled 2018-12-08 (×5): qty 1

## 2018-12-08 MED ORDER — MECLIZINE HCL 25 MG PO TABS
25.0000 mg | ORAL_TABLET | Freq: Three times a day (TID) | ORAL | Status: DC | PRN
  Administered 2018-12-11 – 2018-12-12 (×2): 25 mg via ORAL
  Filled 2018-12-08 (×2): qty 1

## 2018-12-08 MED ORDER — PANTOPRAZOLE SODIUM 40 MG PO TBEC
80.0000 mg | DELAYED_RELEASE_TABLET | Freq: Two times a day (BID) | ORAL | Status: DC
  Administered 2018-12-08 – 2018-12-12 (×10): 80 mg via ORAL
  Filled 2018-12-08: qty 4
  Filled 2018-12-08 (×9): qty 2

## 2018-12-08 MED ORDER — SODIUM CHLORIDE 0.9% FLUSH
3.0000 mL | Freq: Two times a day (BID) | INTRAVENOUS | Status: DC
  Administered 2018-12-08 – 2018-12-12 (×8): 3 mL via INTRAVENOUS

## 2018-12-08 MED ORDER — DICYCLOMINE HCL 20 MG PO TABS
20.0000 mg | ORAL_TABLET | Freq: Three times a day (TID) | ORAL | Status: DC
  Administered 2018-12-08 – 2018-12-12 (×14): 20 mg via ORAL
  Filled 2018-12-08 (×14): qty 1

## 2018-12-08 MED ORDER — FLUTICASONE PROPIONATE 50 MCG/ACT NA SUSP
1.0000 | Freq: Two times a day (BID) | NASAL | Status: DC
  Administered 2018-12-08 – 2018-12-12 (×8): 1 via NASAL
  Filled 2018-12-08: qty 16

## 2018-12-08 MED ORDER — SIMETHICONE 80 MG PO CHEW
125.0000 mg | CHEWABLE_TABLET | Freq: Four times a day (QID) | ORAL | Status: DC | PRN
  Administered 2018-12-11: 16:00:00 120 mg via ORAL
  Filled 2018-12-08 (×2): qty 2

## 2018-12-08 MED ORDER — FAMOTIDINE 20 MG PO TABS
20.0000 mg | ORAL_TABLET | Freq: Two times a day (BID) | ORAL | Status: DC
  Administered 2018-12-08 – 2018-12-12 (×9): 20 mg via ORAL
  Filled 2018-12-08 (×9): qty 1

## 2018-12-08 MED ORDER — TRAZODONE HCL 100 MG PO TABS
200.0000 mg | ORAL_TABLET | Freq: Every day | ORAL | Status: DC
  Administered 2018-12-08 – 2018-12-11 (×4): 200 mg via ORAL
  Filled 2018-12-08 (×4): qty 2

## 2018-12-08 MED ORDER — LORATADINE 10 MG PO TABS
10.0000 mg | ORAL_TABLET | Freq: Every day | ORAL | Status: DC
  Administered 2018-12-08 – 2018-12-12 (×5): 10 mg via ORAL
  Filled 2018-12-08 (×5): qty 1

## 2018-12-08 MED ORDER — LUBRIFRESH P.M. OP OINT: 1.0000 "application " | TOPICAL_OINTMENT | Freq: Every day | OPHTHALMIC | Status: DC

## 2018-12-08 MED ORDER — ONDANSETRON HCL 4 MG/2ML IJ SOLN: 4.0000 mg | Freq: Four times a day (QID) | INTRAMUSCULAR | Status: DC | PRN

## 2018-12-08 MED ORDER — ACETAMINOPHEN 650 MG RE SUPP: 650.0000 mg | Freq: Four times a day (QID) | RECTAL | Status: DC | PRN

## 2018-12-08 MED ORDER — POLYVINYL ALCOHOL 1.4 % OP SOLN: 1.0000 [drp] | OPHTHALMIC | Status: DC | PRN

## 2018-12-08 MED ORDER — DONEPEZIL HCL 10 MG PO TABS
10.0000 mg | ORAL_TABLET | Freq: Every day | ORAL | Status: DC
  Administered 2018-12-08 – 2018-12-11 (×4): 10 mg via ORAL
  Filled 2018-12-08 (×5): qty 1

## 2018-12-08 MED ORDER — HYDROCORTISONE 1 % EX OINT: TOPICAL_OINTMENT | Freq: Two times a day (BID) | CUTANEOUS | Status: DC | PRN

## 2018-12-08 MED ORDER — ACETAMINOPHEN 325 MG PO TABS: 650.0000 mg | ORAL_TABLET | ORAL | Status: DC | PRN

## 2018-12-08 MED ORDER — GUAIFENESIN 100 MG/5ML PO SOLN
200.0000 mg | ORAL | Status: DC | PRN
  Filled 2018-12-08: qty 10

## 2018-12-08 MED ORDER — GABAPENTIN 300 MG PO CAPS
300.0000 mg | ORAL_CAPSULE | Freq: Two times a day (BID) | ORAL | Status: DC
  Administered 2018-12-08 – 2018-12-12 (×8): 300 mg via ORAL
  Filled 2018-12-08 (×8): qty 1

## 2018-12-08 MED ORDER — HYDROXYZINE HCL 25 MG PO TABS
25.0000 mg | ORAL_TABLET | Freq: Three times a day (TID) | ORAL | Status: DC
  Administered 2018-12-08 – 2018-12-12 (×14): 25 mg via ORAL
  Filled 2018-12-08 (×14): qty 1

## 2018-12-08 MED ORDER — NAPROXEN 250 MG PO TABS
250.0000 mg | ORAL_TABLET | Freq: Three times a day (TID) | ORAL | Status: DC | PRN
  Administered 2018-12-08 – 2018-12-12 (×3): 250 mg via ORAL
  Filled 2018-12-08 (×4): qty 1

## 2018-12-08 MED ORDER — ENOXAPARIN SODIUM 40 MG/0.4ML ~~LOC~~ SOLN
40.0000 mg | SUBCUTANEOUS | Status: DC
  Administered 2018-12-08 – 2018-12-11 (×4): 40 mg via SUBCUTANEOUS
  Filled 2018-12-08 (×6): qty 0.4

## 2018-12-08 NOTE — Consult Note (Addendum)
Cardiology Consultation:   Patient ID: SEVAN BUFKIN MRN: [id number]; DOB: 1934-09-12  Admit date: 12/07/2018 Date of Consult: 12/08/2018  Primary Care Provider: Leonard Downing, MD Primary Cardiologist: No primary care provider on file. New Primary Electrophysiologist:  None    Patient Profile:   Erika Conley is a 84 y.o. female with a hx of HTN, dementia  and numerous prior syncopal episodes and with review HR in 64s by EMS and hypotension who is being seen today for the evaluation of syncope and bradycardia at the request of Dr. Benny Lennert.  History of Present Illness:   Ms. Zengel with hx of HTN and dementia.  Also prior hx of syncope due to orthostatic hypotension.  Recently with  Syncope her HR with EMS have been low 50s to 40s.  EKG confirms this .  HR today at 67.  She was hypoxic today as well.  Had syncope in May, another episodes 2 weeks ago and then today.  She did have Lt sided chest pain yesterday.     Echo in 2017 with EF 55-60%, G1DD, trivial MR  EKG:  The EKG was personally reviewed and demonstrates:  SR 67, borderline 1t degree AV block at 200 ms.  Telemetry:  Telemetry was personally reviewed and demonstrates:  SR  Na 136 K+ 3.9, Cr 0.93, Mg + 1.8,  Troponin 2,3  Hgb 13.6 HCT 38.6 2V CXR IMPRESSION: 1. Low lung volumes with associated bibasilar atelectatic changes. 2. No other active cardiopulmonary disease.  BP 148/69 P in 60s now.    Heart Pathway Score:     Past Medical History:  Diagnosis Date  . Anxiety   . Arthritis   . Depression   . Falls frequently   . Fibromyalgia   . Fracture, clavicle   . GERD (gastroesophageal reflux disease)   . History of fractured vertebra   . Hypertension   . Hypothyroidism   . Seasonal allergies   . Thyroid disease   . Vertigo   . Vitamin A deficiency   . Wrist fracture, right     Past Surgical History:  Procedure Laterality Date  . ABDOMINAL HYSTERECTOMY    . BACK SURGERY    . CHOLECYSTECTOMY    .  ERCP N/A 10/22/2015   Procedure: ENDOSCOPIC RETROGRADE CHOLANGIOPANCREATOGRAPHY (ERCP);  Surgeon: Milus Banister, MD;  Location: [facility];  Service: Endoscopy;  Laterality: N/A;  . ERCP N/A 11/25/2015   Procedure: ENDOSCOPIC RETROGRADE CHOLANGIOPANCREATOGRAPHY (ERCP);  Surgeon: Milus Banister, MD;  Location: [facility];  Service: Endoscopy;  Laterality: N/A;  . EUS N/A 11/25/2015   Procedure: ESOPHAGEAL ENDOSCOPIC ULTRASOUND (EUS) RADIAL;  Surgeon: Milus Banister, MD;  Location: [facility];  Service: Endoscopy;  Laterality: N/A;  . FRACTURE SURGERY    . THYROIDECTOMY    . WRIST SURGERY       Home Medications:  Prior to Admission medications   Medication Sig Start Date End Date Taking? Authorizing Provider  acetaminophen (TYLENOL) 325 MG tablet Take 650 mg by mouth every 4 (four) hours as needed for mild pain.   Yes [provider]  acetaminophen (TYLENOL) 500 MG tablet Take 500 mg by mouth 2 (two) times daily.   Yes [provider]  busPIRone (BUSPAR) 15 MG tablet Take 22.5 mg by mouth 2 (two) times daily.   Yes [provider]  calcium-vitamin D (OSCAL WITH D) 500-200 MG-UNIT tablet Take 1 tablet by mouth daily.   Yes [provider]  carboxymethylcellulose 1 % ophthalmic  solution Place 1 drop into both eyes as needed (for dry eyes).   Yes [provider]  cetirizine (ZYRTEC) 10 MG tablet Take 10 mg by mouth daily.   Yes [provider]  dicyclomine (BENTYL) 20 MG tablet Take 20 mg by mouth 3 (three) times daily.   Yes [provider]  donepezil (ARICEPT) 10 MG tablet Take 10 mg by mouth at bedtime.   Yes [provider]  Epinastine HCl 0.05 % ophthalmic solution Place 1 drop into both eyes 2 (two) times daily as needed (for dry eye syndrome or allergic conjunctivitis).   Yes [provider]  famotidine (PEPCID) 20 MG tablet Take 20 mg by mouth 2 (two) times daily.   Yes [provider]   fluticasone (FLONASE) 50 MCG/ACT nasal spray Place 1 spray into both nostrils 2 (two) times daily.    Yes [provider]  gabapentin (NEURONTIN) 300 MG capsule Take 300 mg by mouth 2 (two) times daily.   Yes [provider]  guaiFENesin (ROBAFEN MUCUS/CHEST CONGESTION) 100 MG/5ML liquid Take 200 mg by mouth every 4 (four) hours as needed for cough.   Yes [provider]  hydrocortisone 2.5 % ointment Apply topically 2 (two) times daily as needed (to area on face for rash).   Yes [provider]  hydrOXYzine (VISTARIL) 25 MG capsule Take 25 mg by mouth 3 (three) times daily.    Yes [provider]  levothyroxine (SYNTHROID) 100 MCG tablet Take 100 mcg by mouth daily before breakfast.   Yes [provider]  loperamide (IMODIUM) 2 MG capsule Take 2 mg by mouth daily as needed for diarrhea or loose stools. After each loose stool   Yes [provider]  LORazepam (ATIVAN) 0.5 MG tablet Take 0.5 mg by mouth every evening.    Yes [provider]  meclizine (ANTIVERT) 25 MG tablet Take 25 mg by mouth 3 (three) times daily as needed for dizziness.   Yes [provider]  Multiple Vitamins-Minerals (CERTAVITE SENIOR/ANTIOXIDANT PO) Take 1 tablet by mouth daily.   Yes [provider]  Multiple Vitamins-Minerals (PRESERVISION AREDS) CAPS Take 1 capsule by mouth 2 (two) times daily.   Yes [provider]  naproxen (NAPROSYN) 250 MG tablet Take 250 mg by mouth 3 (three) times daily as needed (for osteoarthritis).   Yes [provider]  omeprazole (PRILOSEC) 40 MG capsule Take 1 capsule (40 mg total) by mouth 2 (two) times daily. Take shortly before breakfast and dinner meal 07/16/18  Yes Milus Banister, MD  ondansetron (ZOFRAN) 8 MG tablet Take 8 mg by mouth every 8 (eight) hours as needed for nausea or vomiting.   Yes [provider]  sertraline (ZOLOFT) 100 MG tablet Take 100 mg by mouth daily.    Yes [provider]  simethicone (MYLICON) 0000000 MG chewable tablet Chew 125 mg by mouth every 6 (six) hours as needed for flatulence.   Yes [provider]  traZODone (DESYREL) 100 MG tablet Take 200 mg by mouth at bedtime.    Yes [provider]  Jay Schlichter Oil (ARTIFICIAL TEARS) OINT ophthalmic ointment Place 1 application into both eyes at bedtime.   Yes [provider]    Inpatient Medications: Scheduled Meds: . artificial tears   Both Eyes QHS  . busPIRone  22.5 mg Oral BID  . calcium-vitamin D  1 tablet Oral Daily  . dicyclomine  20 mg Oral TID  . donepezil  10 mg  Oral QHS  . enoxaparin (LOVENOX) injection  40 mg Subcutaneous Q24H  . famotidine  20 mg Oral BID  . fluticasone  1 spray Each Nare BID  . gabapentin  300 mg Oral BID  . hydrOXYzine  25 mg Oral TID  . levothyroxine  100 mcg Oral Q0600  . loratadine  10 mg Oral Daily  . LORazepam  0.5 mg Oral QPM  . multivitamin with minerals  1 tablet Oral Daily  . pantoprazole  80 mg Oral BID AC  . sertraline  100 mg Oral Daily  . sodium chloride flush  3 mL Intravenous Q12H  . traZODone  200 mg Oral QHS   Continuous Infusions:  PRN Meds: acetaminophen **OR** acetaminophen, guaiFENesin, hydrocortisone, ketotifen, loperamide, meclizine, naproxen, ondansetron **OR** ondansetron (ZOFRAN) IV, polyvinyl alcohol, simethicone  Allergies:    Allergies  Allergen Reactions  . Codeine     Pass out, fall  . Cortisone     Pass out, fall   . Other     All narcotics cause patient to Pass out, fall     Social History:   Social History   Socioeconomic History  . Marital status: Divorced    Spouse name: Not on file  . Number of children: Not on file  . Years of education: Not on file  . Highest education level: Not on file  Occupational History  . Not on file  Social Needs  . Financial resource strain: Not on file  . Food insecurity    Worry: Not on file    Inability: Not on  file  . Transportation needs    Medical: Not on file    Non-medical: Not on file  Tobacco Use  . Smoking status: Never Smoker  . Smokeless tobacco: Never Used  Substance and Sexual Activity  . Alcohol use: No  . Drug use: No  . Sexual activity: Not Currently  Lifestyle  . Physical activity    Days per week: Not on file    Minutes per session: Not on file  . Stress: Not on file  Relationships  . Social Herbalist on phone: Not on file    Gets together: Not on file    Attends religious service: Not on file    Active member of club or organization: Not on file    Attends meetings of clubs or organizations: Not on file    Relationship status: Not on file  . Intimate partner violence    Fear of current or ex partner: Not on file    Emotionally abused: Not on file    Physically abused: Not on file    Forced sexual activity: Not on file  Other Topics Concern  . Not on file  Social History Narrative  . Not on file    Family History:    Family History  Problem Relation Age of Onset  . Hypertension Other      ROS:  Please see the history of present illness.  General:no colds or fevers, no weight changes Skin:no rashes or ulcers HEENT:no blurred vision, no congestion CV:see HPI PUL:see HPI GI:no diarrhea constipation or melena, no indigestion GU:no hematuria, no dysuria MS:no joint pain, no claudication Neuro:no syncope, no lightheadedness, dementia Endo:no diabetes,+ thyroid disease  All other ROS reviewed and negative.     Physical Exam/Data:   Vitals:   12/08/18 0730 12/08/18 0800 12/08/18 0828 12/08/18 1000  BP: (!) 170/44 (!) 150/68  (!) 148/68  Pulse: 66 70  63  Resp: 14 15  15   Temp:      TempSrc:      SpO2: 97% 97%  98%  Weight:   64 kg   Height:   5' (1.524 m)    No intake or output data in the 24 hours ending 12/08/18 1108 Last 3 Weights 12/08/2018 12/07/2018 09/20/2018  Weight (lbs) 141 lb 141 lb 143 lb  Weight (kg) 63.957 kg 63.957 kg  64.864 kg  Some encounter information is confidential and restricted. Go to Review Flowsheets activity to see all data.     Body mass index is 27.54 kg/m.  Exam by Dr. Sallyanne Kuster   Relevant CV Studies: Echo 04/09/15 Study Conclusions  - Left ventricle: The cavity size was normal. Systolic function was   normal. The estimated ejection fraction was in the range of 55%   to 60%. Wall motion was normal; there were no regional wall   motion abnormalities. There was an increased relative   contribution of atrial contraction to ventricular filling.   Doppler parameters are consistent with abnormal left ventricular   relaxation (grade 1 diastolic dysfunction). - Mitral valve: There was trivial regurgitation.  Laboratory Data:  High Sensitivity Troponin:   Recent Labs  Lab 12/08/18 0141 12/08/18 0412  TROPONINIHS <2 3     Chemistry Recent Labs  Lab 12/08/18 0035  NA 136  K 3.9  CL 95*  CO2 29  GLUCOSE 124*  BUN 9  CREATININE 0.93  CALCIUM 8.8*  GFRNONAA 57*  GFRAA >60  ANIONGAP 12    Recent Labs  Lab 12/08/18 0035  PROT 5.5*  ALBUMIN 3.5  AST 18  ALT 11  ALKPHOS 46  BILITOT 0.4   Hematology Recent Labs  Lab 12/08/18 0035  WBC 6.2  RBC 4.39  HGB 13.6  HCT 38.6  MCV 87.9  MCH 31.0  MCHC 35.2  RDW 13.0  PLT 163   BNPNo results for input(s): BNP, PROBNP in the last 168 hours.  DDimer  Recent Labs  Lab 12/08/18 0141  DDIMER <0.27     Radiology/Studies:  X-ray Chest Pa And Lateral  Result Date: 12/08/2018 CLINICAL DATA:  Initial evaluation for acute syncope. EXAM: CHEST - 2 VIEW COMPARISON:  Prior radiograph from 11/23/2018. FINDINGS: Mild cardiomegaly, stable. Mediastinal silhouette within normal limits. Lungs hypoinflated. Streaky bibasilar atelectatic changes. No focal infiltrates. No edema or pleural effusion. No pneumothorax. No acute osseous finding. Multiple chronic compression deformities with sequelae of prior vertebral augmentation noted  about the thoracolumbar junction. Remote posttraumatic deformity noted at the proximal left humerus. IMPRESSION: 1. Low lung volumes with associated bibasilar atelectatic changes. 2. No other active cardiopulmonary disease. Electronically Signed   By: Jeannine Boga M.D.   On: 12/08/2018 05:50    Assessment and Plan:   1. Syncope with some bradycardia and hx of orthostatic hypotension.  Check orthostatic BP,  Check TSH , echo pending.  She has hx of syncope dating back to 2017, now more freq episodes.  Dr. Sallyanne Kuster has seen.  No rate lowering meds. 2.  HTN controlled 3. hypothyroidism on synthroid.       For questions or updates, please contact [place] Please consult [email or web address] for contact info under     Signed, Cecilie Kicks, NP  12/08/2018 11:08 AM   I have seen and examined the patient along with Cecilie Kicks, NP .  I have reviewed the chart, notes and new data.  I agree with PA/NP's note.  Key new complaints: life  long history of fainting with pattern highly suggestive of vasovagal syncope (lengthy prodrome w flushing/heat, nausea), resolved by lying down). Recent episodes (including current one) related to straining at restroom. She has occasional dyspnea at rest, but not with usual activity. Key examination changes:  General: Alert, oriented x3, no distress Head: no evidence of trauma, PERRL, EOMI, no exophtalmos or lid lag, no myxedema, no xanthelasma; normal ears, nose and oropharynx Neck: normal jugular venous pulsations and no hepatojugular reflux; brisk carotid pulses without delay and no carotid bruits Chest: clear to auscultation, no signs of consolidation by percussion or palpation, normal fremitus, symmetrical and full respiratory excursions Cardiovascular: normal position and quality of the apical impulse, regular rhythm, normal first and second heart sounds, no murmurs, rubs or gallops Abdomen: no tenderness or distention, no masses by palpation, no  abnormal pulsatility or arterial bruits, normal bowel sounds, no hepatosplenomegaly Extremities: no clubbing, cyanosis or edema; 2+ radial, ulnar and brachial pulses bilaterally; 2+ right femoral, posterior tibial and dorsalis pedis pulses; 2+ left femoral, posterior tibial and dorsalis pedis pulses; no subclavian or femoral bruits Neurological: grossly nonfocal Psych: Normal mood and affect  Key new findings / data: mild sinus bradycardia (mostly high 50s) on telemetry  PLAN: Discussed measures to avoid injury/syncope (avoid triggers if possible, drink plenty of fluids, a more liberal diet regarding sodium content, avoid constipation, etc.). Needs to lie down horizontally as soon as prodromal complaints occur.  Check updated echo for dyspnea, but otherwise no signs of CHF and symptom presentation would be atypical.  Sanda Klein, MD, [place] [phone] 12/08/2018, 12:50 PM

## 2018-12-08 NOTE — Progress Notes (Signed)
*  PRELIMINARY RESULTS* Echocardiogram 2D Echocardiogram has been performed.  Erika Conley 12/08/2018, 4:50 PM

## 2018-12-08 NOTE — H&P (Signed)
History and Physical    Erika Conley [id number] DOB: 1934-06-04 DOA: 12/07/2018  PCP: Leonard Downing, MD  Patient coming from: [place] have personally briefly reviewed patient's old medical records in [place]  Chief Complaint: Syncope  HPI: Erika Conley is a 84 y.o. female with medical history significant of HTN, numerous prior syncopal episodes.  Patient presents to the ED after a syncopal episode.  Had syncopal episode in May, and another episode x2 weeks ago.  Happened again today.  All 3 of these episodes have been when she was on the commode for BM, just finished using the bathroom and then she stands up, feels weak, sits back down, and passes out.  After each episode EMS is called, and she is typically documented as being bradycardic and hypotensive on EMS arrival.  She is documented as bradycardic and hypoxic (the latter oddly enough) today.  Did have episode of L-sided CP yesterday.  Does have generalized abd pain, not new for her, c/w prior episodes of gastritis.   ED Course: Vital signs normal in ED and she is asymptomatic at this time.   Review of Systems: As per HPI, otherwise all review of systems negative.  Past Medical History:  Diagnosis Date  . Anxiety   . Arthritis   . Depression   . Falls frequently   . Fibromyalgia   . Fracture, clavicle   . GERD (gastroesophageal reflux disease)   . History of fractured vertebra   . Hypertension   . Hypothyroidism   . Seasonal allergies   . Thyroid disease   . Vertigo   . Vitamin A deficiency   . Wrist fracture, right     Past Surgical History:  Procedure Laterality Date  . ABDOMINAL HYSTERECTOMY    . BACK SURGERY    . CHOLECYSTECTOMY    . ERCP N/A 10/22/2015   Procedure: ENDOSCOPIC RETROGRADE CHOLANGIOPANCREATOGRAPHY (ERCP);  Surgeon: Milus Banister, MD;  Location: [facility];  Service: Endoscopy;  Laterality: N/A;  . ERCP N/A 11/25/2015   Procedure: ENDOSCOPIC  RETROGRADE CHOLANGIOPANCREATOGRAPHY (ERCP);  Surgeon: Milus Banister, MD;  Location: [facility];  Service: Endoscopy;  Laterality: N/A;  . EUS N/A 11/25/2015   Procedure: ESOPHAGEAL ENDOSCOPIC ULTRASOUND (EUS) RADIAL;  Surgeon: Milus Banister, MD;  Location: [facility];  Service: Endoscopy;  Laterality: N/A;  . FRACTURE SURGERY    . THYROIDECTOMY    . WRIST SURGERY       reports that she has never smoked. She has never used smokeless tobacco. She reports that she does not drink alcohol or use drugs.  Allergies  Allergen Reactions  . Codeine     Pass out, fall  . Cortisone     Pass out, fall   . Other     All narcotics cause patient to Pass out, fall     Family History  Problem Relation Age of Onset  . Hypertension Other      Prior to Admission medications   Medication Sig Start Date End Date Taking? Authorizing Provider  acetaminophen (TYLENOL) 325 MG tablet Take 650 mg by mouth every 4 (four) hours as needed for mild pain.   Yes [provider]  acetaminophen (TYLENOL) 500 MG tablet Take 500 mg by mouth 2 (two) times daily.   Yes [provider]  busPIRone (BUSPAR) 15 MG tablet Take 22.5 mg by mouth 2 (two) times daily.   Yes [provider]  calcium-vitamin D (OSCAL WITH D) 500-200  MG-UNIT tablet Take 1 tablet by mouth daily.   Yes [provider]  carboxymethylcellulose 1 % ophthalmic solution Place 1 drop into both eyes as needed (for dry eyes).   Yes [provider]  cetirizine (ZYRTEC) 10 MG tablet Take 10 mg by mouth daily.   Yes [provider]  dicyclomine (BENTYL) 20 MG tablet Take 20 mg by mouth 3 (three) times daily.   Yes [provider]  donepezil (ARICEPT) 10 MG tablet Take 10 mg by mouth at bedtime.   Yes [provider]  Epinastine HCl 0.05 % ophthalmic solution Place 1 drop into both eyes 2 (two) times daily as needed (for dry eye syndrome or allergic conjunctivitis).   Yes [provider]  famotidine (PEPCID) 20 MG tablet Take 20 mg by mouth 2 (two) times daily.   Yes [provider]  fluticasone (FLONASE) 50 MCG/ACT nasal spray Place 1 spray into both nostrils 2 (two) times daily.    Yes [provider]  gabapentin (NEURONTIN) 300 MG capsule Take 300 mg by mouth 2 (two) times daily.   Yes [provider]  guaiFENesin (ROBAFEN MUCUS/CHEST CONGESTION) 100 MG/5ML liquid Take 200 mg by mouth every 4 (four) hours as needed for cough.   Yes [provider]  hydrocortisone 2.5 % ointment Apply topically 2 (two) times daily as needed (to area on face for rash).   Yes [provider]  hydrOXYzine (VISTARIL) 25 MG capsule Take 25 mg by mouth 3 (three) times daily.    Yes [provider]  levothyroxine (SYNTHROID) 100 MCG tablet Take 100 mcg by mouth daily before breakfast.   Yes [provider]  loperamide (IMODIUM) 2 MG capsule Take 2 mg by mouth daily as needed for diarrhea or loose stools. After each loose stool   Yes [provider]  LORazepam (ATIVAN) 0.5 MG tablet Take 0.5 mg by mouth every evening.    Yes [provider]  meclizine (ANTIVERT) 25 MG tablet Take 25 mg by mouth 3 (three) times daily as needed for dizziness.   Yes [provider]  Multiple Vitamins-Minerals (CERTAVITE SENIOR/ANTIOXIDANT PO) Take 1 tablet by mouth daily.   Yes [provider]  Multiple Vitamins-Minerals (PRESERVISION AREDS) CAPS Take 1 capsule by mouth 2 (two) times daily.   Yes [provider]  naproxen (NAPROSYN) 250 MG tablet Take 250 mg by mouth 3 (three) times daily as needed (for osteoarthritis).   Yes [provider]  omeprazole (PRILOSEC) 40 MG capsule Take 1 capsule (40 mg total) by mouth 2 (two) times daily. Take shortly before breakfast and dinner meal 07/16/18  Yes Milus Banister, MD  ondansetron (ZOFRAN) 8 MG tablet Take 8 mg by mouth every 8 (eight) hours as needed  for nausea or vomiting.   Yes [provider]  sertraline (ZOLOFT) 100 MG tablet Take 100 mg by mouth daily.   Yes [provider]  simethicone (MYLICON) 0000000 MG chewable tablet Chew 125 mg by mouth every 6 (six) hours as needed for flatulence.   Yes [provider]  traZODone (DESYREL) 100 MG tablet Take 200 mg by mouth at bedtime.    Yes [provider]  Jay Schlichter Oil (ARTIFICIAL TEARS) OINT ophthalmic ointment Place 1 application into both eyes at bedtime.   Yes [provider]    Physical Exam: Vitals:   12/08/18 0030 12/08/18 0045 12/08/18 0100 12/08/18 0115  BP: (!) 130/52 102/80 (!) 130/53 140/73  Pulse: Marland Kitchen)  52 (!) 51 (!) 51 (!) 52  Resp: 16 16 15 15   Temp:      TempSrc:      SpO2: 99% 99% 98% 98%  Weight:      Height:        Constitutional: NAD, calm, comfortable Eyes: PERRL, lids and conjunctivae normal ENMT: Mucous membranes are moist. Posterior pharynx clear of any exudate or lesions.Normal dentition.  Neck: normal, supple, no masses, no thyromegaly Respiratory: clear to auscultation bilaterally, no wheezing, no crackles. Normal respiratory effort. No accessory muscle use.  Cardiovascular: Regular rate and rhythm, no murmurs / rubs / gallops. No extremity edema. 2+ pedal pulses. No carotid bruits.  Abdomen: no tenderness, no masses palpated. No hepatosplenomegaly. Bowel sounds positive.  Musculoskeletal: no clubbing / cyanosis. No joint deformity upper and lower extremities. Good ROM, no contractures. Normal muscle tone.  Skin: no rashes, lesions, ulcers. No induration Neurologic: CN 2-12 grossly intact. Sensation intact, DTR normal. Strength 5/5 in all 4.  Psychiatric: Normal judgment and insight. Alert and oriented x 3. Normal mood.    Labs on Admission: I have personally reviewed following labs and imaging studies  CBC: Recent Labs  Lab 12/08/18 0035  WBC 6.2  NEUTROABS 4.4  HGB 13.6  HCT 38.6  MCV  87.9  PLT [id number]   Basic Metabolic Panel: Recent Labs  Lab 12/08/18 0035  NA 136  K 3.9  CL 95*  CO2 29  GLUCOSE 124*  BUN 9  CREATININE 0.93  CALCIUM 8.8*  MG 1.8   GFR: Estimated Creatinine Clearance: 38.9 mL/min (by C-G formula based on SCr of 0.93 mg/dL). Liver Function Tests: Recent Labs  Lab 12/08/18 0035  AST 18  ALT 11  ALKPHOS 46  BILITOT 0.4  PROT 5.5*  ALBUMIN 3.5   No results for input(s): LIPASE, AMYLASE in the last 168 hours. No results for input(s): AMMONIA in the last 168 hours. Coagulation Profile: No results for input(s): INR, PROTIME in the last 168 hours. Cardiac Enzymes: No results for input(s): CKTOTAL, CKMB, CKMBINDEX, TROPONINI in the last 168 hours. BNP (last 3 results) No results for input(s): PROBNP in the last 8760 hours. HbA1C: No results for input(s): HGBA1C in the last 72 hours. CBG: Recent Labs  Lab 12/08/18 0034  GLUCAP 122*   Lipid Profile: No results for input(s): CHOL, HDL, LDLCALC, TRIG, CHOLHDL, LDLDIRECT in the last 72 hours. Thyroid Function Tests: No results for input(s): TSH, T4TOTAL, FREET4, T3FREE, THYROIDAB in the last 72 hours. Anemia Panel: No results for input(s): VITAMINB12, FOLATE, FERRITIN, TIBC, IRON, RETICCTPCT in the last 72 hours. Urine analysis:    Component Value Date/Time   COLORURINE YELLOW 11/23/2018 0451   APPEARANCEUR HAZY (A) 11/23/2018 0451   LABSPEC 1.018 11/23/2018 0451   PHURINE 6.0 11/23/2018 0451   GLUCOSEU NEGATIVE 11/23/2018 0451   HGBUR NEGATIVE 11/23/2018 0451   BILIRUBINUR NEGATIVE 11/23/2018 0451   KETONESUR NEGATIVE 11/23/2018 0451   PROTEINUR 30 (A) 11/23/2018 0451   UROBILINOGEN 0.2 11/21/2014 0501   NITRITE NEGATIVE 11/23/2018 0451   LEUKOCYTESUR SMALL (A) 11/23/2018 0451    Radiological Exams on Admission: No results found.  EKG: Independently reviewed.  Assessment/Plan Principal Problem:   Syncope Active Problems:   Hypertension   Hypothyroidism    1. Syncope  - sounds vasovagal, but due to frequency and recurrence is becoming a major risk to the patient for falls. 1. Syncope pathway 2. Tele monitor 3. 2d echo 4. Likely will want to discuss with cards after  2d echo results 2. HTN - 1. Continue home BP meds 3. Hypothyroidism - continue synthroid  DVT prophylaxis: Lovenox Code Status: DNR Family Communication: No family in room Disposition Plan: Back to [facility] after admission Consults called: None Admission status: Place in [place], [facility]  How to contact the [facility] Attending or Consulting provider [place] or covering provider during after hours [place], for this patient?  1. Check the care team in [facility] and look for a) attending/consulting [facility] provider listed and b) the [facility] team listed 2. Log into [email or web address]  Amion Physician Scheduling and messaging for groups and whole hospitals  On call and physician scheduling software for group practices, residents, hospitalists and other medical providers for call, clinic, rotation and shift schedules. On[employer] is a hospital-wide system for scheduling doctors and paging doctors on call. EasyPlot is for scientific plotting and data analysis.  [email or web address]  and use [place]'s universal password to access. If you do not have the password, please contact the hospital operator.  3. Locate the [facility] provider you are looking for under [facility] and page to a number that you can be directly reached. 4. If you still have difficulty reaching the provider, please page the [facility] (Director on Call) for the Hospitalists listed on amion for assistance.  12/08/2018, 4:46 AM

## 2018-12-08 NOTE — ED Notes (Signed)
Ordered breakfast--Erika Conley 

## 2018-12-08 NOTE — Progress Notes (Signed)
This patient was examined and admitted by my colleague, Dr Alcario Drought, earlier this morning. She has been roomed in the ED awaiting a telemetry bed. Cardiology has been consulted for her bradycardia, although not severe, as the patient is not on a rate limiting medication and has had 3 episodes of vasovagal syncope since May. Except for HR on the low side, the patient's vitals have been within normal range. She is saturating 98% on room air. Echocardiogram is pending. Troponins are flat.   She is awake, alert, and oriented x 3. No acute distress. Heart rate and lung sounds are within normal limits. Abdomen is soft, non-tender, non-distended. +2 pitting edema to her lower extremities bilaterally.

## 2018-12-09 DIAGNOSIS — R5381 Other malaise: Secondary | ICD-10-CM

## 2018-12-09 DIAGNOSIS — R296 Repeated falls: Secondary | ICD-10-CM | POA: Diagnosis present

## 2018-12-09 DIAGNOSIS — F0390 Unspecified dementia without behavioral disturbance: Secondary | ICD-10-CM | POA: Diagnosis present

## 2018-12-09 DIAGNOSIS — R001 Bradycardia, unspecified: Secondary | ICD-10-CM

## 2018-12-09 DIAGNOSIS — I440 Atrioventricular block, first degree: Secondary | ICD-10-CM | POA: Diagnosis present

## 2018-12-09 DIAGNOSIS — Z9049 Acquired absence of other specified parts of digestive tract: Secondary | ICD-10-CM | POA: Diagnosis not present

## 2018-12-09 DIAGNOSIS — Z20828 Contact with and (suspected) exposure to other viral communicable diseases: Secondary | ICD-10-CM | POA: Diagnosis present

## 2018-12-09 DIAGNOSIS — E039 Hypothyroidism, unspecified: Secondary | ICD-10-CM | POA: Diagnosis not present

## 2018-12-09 DIAGNOSIS — K219 Gastro-esophageal reflux disease without esophagitis: Secondary | ICD-10-CM | POA: Diagnosis present

## 2018-12-09 DIAGNOSIS — R55 Syncope and collapse: Secondary | ICD-10-CM | POA: Diagnosis present

## 2018-12-09 DIAGNOSIS — M47812 Spondylosis without myelopathy or radiculopathy, cervical region: Secondary | ICD-10-CM | POA: Diagnosis present

## 2018-12-09 DIAGNOSIS — M797 Fibromyalgia: Secondary | ICD-10-CM | POA: Diagnosis present

## 2018-12-09 DIAGNOSIS — F329 Major depressive disorder, single episode, unspecified: Secondary | ICD-10-CM | POA: Diagnosis present

## 2018-12-09 DIAGNOSIS — Z8679 Personal history of other diseases of the circulatory system: Secondary | ICD-10-CM | POA: Diagnosis not present

## 2018-12-09 DIAGNOSIS — Z66 Do not resuscitate: Secondary | ICD-10-CM | POA: Diagnosis present

## 2018-12-09 DIAGNOSIS — I10 Essential (primary) hypertension: Secondary | ICD-10-CM | POA: Diagnosis not present

## 2018-12-09 DIAGNOSIS — I340 Nonrheumatic mitral (valve) insufficiency: Secondary | ICD-10-CM | POA: Diagnosis present

## 2018-12-09 DIAGNOSIS — I951 Orthostatic hypotension: Secondary | ICD-10-CM | POA: Diagnosis present

## 2018-12-09 DIAGNOSIS — E890 Postprocedural hypothyroidism: Secondary | ICD-10-CM | POA: Diagnosis present

## 2018-12-09 DIAGNOSIS — R42 Dizziness and giddiness: Secondary | ICD-10-CM | POA: Diagnosis not present

## 2018-12-09 DIAGNOSIS — H8110 Benign paroxysmal vertigo, unspecified ear: Secondary | ICD-10-CM | POA: Diagnosis present

## 2018-12-09 DIAGNOSIS — J302 Other seasonal allergic rhinitis: Secondary | ICD-10-CM | POA: Diagnosis present

## 2018-12-09 DIAGNOSIS — Z9089 Acquired absence of other organs: Secondary | ICD-10-CM | POA: Diagnosis not present

## 2018-12-09 DIAGNOSIS — F419 Anxiety disorder, unspecified: Secondary | ICD-10-CM | POA: Diagnosis present

## 2018-12-09 DIAGNOSIS — K580 Irritable bowel syndrome with diarrhea: Secondary | ICD-10-CM | POA: Diagnosis present

## 2018-12-09 DIAGNOSIS — Z8719 Personal history of other diseases of the digestive system: Secondary | ICD-10-CM | POA: Diagnosis not present

## 2018-12-09 DIAGNOSIS — Z90710 Acquired absence of both cervix and uterus: Secondary | ICD-10-CM | POA: Diagnosis not present

## 2018-12-09 LAB — GLUCOSE, CAPILLARY: Glucose-Capillary: 101 mg/dL — ABNORMAL HIGH (ref 70–99)

## 2018-12-09 LAB — MRSA PCR SCREENING: MRSA by PCR: NEGATIVE

## 2018-12-09 NOTE — Progress Notes (Signed)
Patient is experiencing moderate weakness. With changes in b/p from lying to sitting [id number] systolic to [id number] systolic. With some acute/chronic  jerking

## 2018-12-09 NOTE — Progress Notes (Signed)
Progress Note  Patient Name: Erika Conley Date of Encounter: 12/09/2018  Primary Cardiologist: Sanda Klein, MD   Subjective   Feeling tired.  She feels dizzy lying in bed and attributes this to her GERD.  Appetite is poor  Inpatient Medications    Scheduled Meds: . artificial tears   Both Eyes QHS  . busPIRone  22.5 mg Oral BID  . calcium-vitamin D  1 tablet Oral Daily  . dicyclomine  20 mg Oral TID  . donepezil  10 mg Oral QHS  . enoxaparin (LOVENOX) injection  40 mg Subcutaneous Q24H  . famotidine  20 mg Oral BID  . fluticasone  1 spray Each Nare BID  . gabapentin  300 mg Oral BID  . hydrOXYzine  25 mg Oral TID  . levothyroxine  100 mcg Oral Q0600  . loratadine  10 mg Oral Daily  . LORazepam  0.5 mg Oral QPM  . multivitamin with minerals  1 tablet Oral Daily  . pantoprazole  80 mg Oral BID AC  . sertraline  100 mg Oral Daily  . sodium chloride flush  3 mL Intravenous Q12H  . traZODone  200 mg Oral QHS   Continuous Infusions:  PRN Meds: acetaminophen **OR** acetaminophen, guaiFENesin, hydrocortisone, ketotifen, loperamide, meclizine, naproxen, ondansetron **OR** ondansetron (ZOFRAN) IV, polyvinyl alcohol, simethicone   Vital Signs    Vitals:   12/08/18 1656 12/08/18 2006 12/09/18 0036 12/09/18 0600  BP: (!) 160/59 (!) 149/57 128/88 127/70  Pulse: 65 63 (!) 58 (!) 46  Resp: 15 16 16 16   Temp: 99.6 F (37.6 C) 99 F (37.2 C) 98.2 F (36.8 C) 97.6 F (36.4 C)  TempSrc: Oral Oral  Oral  SpO2: 95% 95% 93% 93%  Weight: 64 kg   64.1 kg  Height: 5' (1.524 m)       Intake/Output Summary (Last 24 hours) at 12/09/2018 1054 Last data filed at 12/09/2018 0944 Gross per 24 hour  Intake 203 ml  Output 301 ml  Net -98 ml   Last 3 Weights 12/09/2018 12/08/2018 12/08/2018  Weight (lbs) 141 lb 6.4 oz 141 lb 1.6 oz 141 lb  Weight (kg) 64.139 kg 64.003 kg 63.957 kg  Some encounter information is confidential and restricted. Go to Review Flowsheets activity to  see all data.      Telemetry    Sinus bradycardia, sinus rhythm.  50s overnight.  60s in daytime.  - Personally Reviewed  ECG    n/a - Personally Reviewed  Physical Exam   VS:  BP 127/70 (BP Location: Right Arm)   Pulse (!) 46   Temp 97.6 F (36.4 C) (Oral)   Resp 16   Ht 5' (1.524 m)   Wt 64.1 kg   SpO2 93%   BMI 27.62 kg/m  , BMI Body mass index is 27.62 kg/m. GENERAL:  Well appearing.  No acute distress HEENT: Pupils equal round and reactive, fundi not visualized, oral mucosa unremarkable NECK:  No jugular venous distention, waveform within normal limits, carotid upstroke brisk and symmetric, no bruits LUNGS:  Clear to auscultation bilaterally HEART:  RRR.  PMI not displaced or sustained,S1 and S2 within normal limits, no S3, no S4, no clicks, no rubs, no murmurs ABD:  Flat, positive bowel sounds normal in frequency in pitch, no bruits, no rebound, no guarding, no midline pulsatile mass, no hepatomegaly, no splenomegaly EXT:  2 plus pulses throughout, no edema, no cyanosis no clubbing SKIN:  No rashes no nodules NEURO:  Cranial  nerves II through XII grossly intact, motor grossly intact throughout [facility]:  Cognitively intact, oriented to person place and time   Labs    High Sensitivity Troponin:   Recent Labs  Lab 12/08/18 0141 12/08/18 0412  TROPONINIHS <2 3      Chemistry Recent Labs  Lab 12/08/18 0035  NA 136  K 3.9  CL 95*  CO2 29  GLUCOSE 124*  BUN 9  CREATININE 0.93  CALCIUM 8.8*  PROT 5.5*  ALBUMIN 3.5  AST 18  ALT 11  ALKPHOS 46  BILITOT 0.4  GFRNONAA 57*  GFRAA >60  ANIONGAP 12     Hematology Recent Labs  Lab 12/08/18 0035  WBC 6.2  RBC 4.39  HGB 13.6  HCT 38.6  MCV 87.9  MCH 31.0  MCHC 35.2  RDW 13.0  PLT 163    BNPNo results for input(s): BNP, PROBNP in the last 168 hours.   DDimer  Recent Labs  Lab 12/08/18 0141  DDIMER <0.27     Radiology    X-ray Chest Pa And Lateral  Result Date: 12/08/2018 CLINICAL  DATA:  Initial evaluation for acute syncope. EXAM: CHEST - 2 VIEW COMPARISON:  Prior radiograph from 11/23/2018. FINDINGS: Mild cardiomegaly, stable. Mediastinal silhouette within normal limits. Lungs hypoinflated. Streaky bibasilar atelectatic changes. No focal infiltrates. No edema or pleural effusion. No pneumothorax. No acute osseous finding. Multiple chronic compression deformities with sequelae of prior vertebral augmentation noted about the thoracolumbar junction. Remote posttraumatic deformity noted at the proximal left humerus. IMPRESSION: 1. Low lung volumes with associated bibasilar atelectatic changes. 2. No other active cardiopulmonary disease. Electronically Signed   By: Jeannine Boga M.D.   On: 12/08/2018 05:50    Cardiac Studies   Echo 12/08/18:  IMPRESSIONS   1. Left ventricular ejection fraction, by visual estimation, is 60 to 65%. The left ventricle has normal function. Normal left ventricular size. There is no left ventricular hypertrophy.  2. Left ventricular diastolic Doppler parameters are consistent with impaired relaxation pattern of LV diastolic filling.  3. Global right ventricle has normal systolic function.The right ventricular size is normal. No increase in right ventricular wall thickness.  4. Left atrial size was mildly dilated.  5. Right atrial size was normal.  6. The mitral valve is normal in structure. No evidence of mitral valve regurgitation. No evidence of mitral stenosis.  7. The tricuspid valve is normal in structure. Tricuspid valve regurgitation was not visualized by color flow Doppler.  8. The aortic valve is normal in structure. Aortic valve regurgitation was not visualized by color flow Doppler. Structurally normal aortic valve, with no evidence of sclerosis or stenosis.  9. The pulmonic valve was normal in structure. Pulmonic valve regurgitation is not visualized by color flow Doppler. 10. Mildly elevated pulmonary artery systolic pressure.  11. The inferior vena cava is normal in size with greater than 50% respiratory variability, suggesting right atrial pressure of 3 mmHg.   Patient Profile     84 y.o. female with hypertension, dementia, bradycardia and recurrent syncope.  Assessment & Plan    # Syncope: Ma. Schmeer has recurrent episodes of syncope.  She was documented to be bradycardic in the 40s when EMS arrived. She also has known orthostatic hypotension.  This episode occurred in the setting of straining in the restroom.  She currently reports dizziness, but is lying in bed.  It is unlikely that this is cardiac.  Her appetite is also poor.  She is going to start drinking protein  supplements.  Her heart rate has been in the 50s overnight but increases to the 50s and 60s when she is awake.  No plans for pacemaker.  It seems that her episodes are vasovagal in nature.  Orthostatic vital signs show that her blood pressure went from 163/61 sitting to 146/89 standing after 3 minutes.  Her heart rate also increased from 66-115.  This is consistent with POTS.  Start with hydration and increased salt intake as recommended by Dr. Sallyanne Kuster.  Would not recommend beta blocker due to bradycardia.   Recommend compression stockings.   # Hypertension: Her blood pressure is labile.  No plans for antihypertensives given her recurrent syncope.  [facility] will sign off.   Medication Recommendations: n/a Other recommendations (labs, testing, etc):  Increased fluid/salt intake, compression stockings Follow up as an outpatient:  We will arrage  For questions or updates, please contact [place] Please consult [email or web address] for contact info under        Signed, Skeet Latch, MD  12/09/2018, 10:54 AM

## 2018-12-09 NOTE — Plan of Care (Signed)
  Problem: Education: Goal: Knowledge of General Education information will improve Description: Including pain rating scale, medication(s)/side effects and non-pharmacologic comfort measures Outcome: Progressing   Problem: Health Behavior/Discharge Planning: Goal: Ability to manage health-related needs will improve Outcome: Progressing   Problem: Clinical Measurements: Goal: Ability to maintain clinical measurements within normal limits will improve Outcome: Progressing Goal: Will remain free from infection Outcome: Progressing Goal: Diagnostic test results will improve Outcome: Progressing Goal: Respiratory complications will improve Outcome: Progressing Goal: Cardiovascular complication will be avoided Outcome: Progressing   Problem: Activity: Goal: Risk for activity intolerance will decrease Outcome: Progressing   Problem: Coping: Goal: Level of anxiety will decrease Outcome: Progressing   Problem: Elimination: Goal: Will not experience complications related to bowel motility Outcome: Progressing   Problem: Pain Managment: Goal: General experience of comfort will improve Outcome: Progressing   Problem: Safety: Goal: Ability to remain free from injury will improve Outcome: Progressing   

## 2018-12-09 NOTE — Evaluation (Signed)
Physical Therapy Evaluation Patient Details Name: Erika Conley MRN: [id number] DOB: Oct 13, 1934 Today's Date: 12/09/2018   History of Present Illness  Pt is an 84 y/o female admitted following syncopal episode. PMH includes HTN, orthostatic hypotension, fibromyalgia, and vertigo.   Clinical Impression  Pt admitted secondary to problem above with deficits below. Pt requiring min to mod A to stand at EOB using RW. Pt with positive orthostatics, however, stabilized with prolonged rest (see vitals flowsheet). Pt also with notable jerking type motions in BLE when attempting to return to sitting from standing. Pt reporting dizziness and lightheadedness with change in position. Pt from ALF, however, unsure of support they are able to provide. Feel pt is at increased risk for falls and will require increased support at d/c. If ALF able to provide necessary assist, pt will likely require WC for safety given current mobility deficits. Will continue to follow acutely to maximize functional mobility independence and safety.     Follow Up Recommendations SNF;Supervision/Assistance - 24 hour(unless ALF able to provide necessary support )    Equipment Recommendations  Wheelchair (measurements PT);Wheelchair cushion (measurements PT)    Recommendations for Other Services       Precautions / Restrictions Precautions Precautions: Fall Restrictions Weight Bearing Restrictions: No      Mobility  Bed Mobility Overal bed mobility: Needs Assistance Bed Mobility: Supine to Sit;Sit to Supine     Supine to sit: Min assist Sit to supine: Supervision   General bed mobility comments: Min A for LE assist and trunk assist to come to sitting. Pt reports feeling light headed upon sitting and demonstrated positive orthostatics (see vitals flowsheet). Did stabilize with seated rest. Pt with notable jerking type motions in trunk upon return to supine.   Transfers Overall transfer level: Needs  assistance Equipment used: Rolling walker (2 wheeled) Transfers: Sit to/from Stand Sit to Stand: Min assist;Mod assist         General transfer comment: Min A for lift assist to stand with RW. Pt reporting increased light headedness with transfers and demonstrated jerking type motions in BLE. Required mod A for controlled descent back to sitting. RN present in room throughout.   Ambulation/Gait             General Gait Details: unable   Stairs            Wheelchair Mobility    Modified Rankin (Stroke Patients Only)       Balance Overall balance assessment: Needs assistance Sitting-balance support: No upper extremity supported;Feet supported Sitting balance-Leahy Scale: Fair     Standing balance support: Bilateral upper extremity supported;During functional activity Standing balance-Leahy Scale: Poor Standing balance comment: Reliant on UE and external support                             Pertinent Vitals/Pain Pain Assessment: No/denies pain    Home Living Family/patient expects to be discharged to:: Assisted living               Home Equipment: Walker - 4 wheels      Prior Function Level of Independence: Needs assistance   Gait / Transfers Assistance Needed: Reports she is mod I with use of rollator for mobility tasks.   ADL's / Homemaking Assistance Needed: Reports aide has to assist with shower transfers and bathin.         Hand Dominance        Extremity/Trunk Assessment  Upper Extremity Assessment Upper Extremity Assessment: Defer to OT evaluation    Lower Extremity Assessment Lower Extremity Assessment: Generalized weakness    Cervical / Trunk Assessment Cervical / Trunk Assessment: Normal  Communication   Communication: No difficulties  Cognition Arousal/Alertness: Awake/alert Behavior During Therapy: WFL for tasks assessed/performed Overall Cognitive Status: Within Functional Limits for tasks assessed                                         General Comments      Exercises     Assessment/Plan    PT Assessment Patient needs continued PT services  PT Problem List Decreased knowledge of precautions;Cardiopulmonary status limiting activity;Decreased mobility;Decreased balance;Decreased activity tolerance;Decreased strength;Decreased knowledge of use of DME       PT Treatment Interventions Gait training;Functional mobility training;Therapeutic activities;Therapeutic exercise;Balance training;Patient/family education;DME instruction    PT Goals (Current goals can be found in the Care Plan section)  Acute Rehab PT Goals Patient Stated Goal: to feel better PT Goal Formulation: With patient Time For Goal Achievement: 12/23/18 Potential to Achieve Goals: Good    Frequency Min 2X/week   Barriers to discharge        Co-evaluation               AM-PAC PT "6 Clicks" Mobility  Outcome Measure Help needed turning from your back to your side while in a flat bed without using bedrails?: A Little Help needed moving from lying on your back to sitting on the side of a flat bed without using bedrails?: A Little Help needed moving to and from a bed to a chair (including a wheelchair)?: A Lot Help needed standing up from a chair using your arms (e.g., wheelchair or bedside chair)?: A Lot Help needed to walk in hospital room?: A Lot Help needed climbing 3-5 steps with a railing? : A Lot 6 Click Score: 14    End of Session Equipment Utilized During Treatment: Gait belt Activity Tolerance: Treatment limited secondary to medical complications (Comment)(dizziness, lightheadedness) Patient left: in bed;with call bell/phone within reach;with bed alarm set;with nursing/sitter in room Nurse Communication: Mobility status;Other (comment)(dizziness/lightheadedness) PT Visit Diagnosis: Unsteadiness on feet (R26.81);Muscle weakness (generalized) (M62.81);Dizziness and giddiness (R42)     Time: [id number] PT Time Calculation (min) (ACUTE ONLY): 16 min   Charges:   PT Evaluation $PT Eval Moderate Complexity: 1 Mod          Leighton Ruff, PT, DPT  Acute Rehabilitation Services  Pager: [phone] Office: [phone]   Rudean Hitt 12/09/2018, 4:52 PM

## 2018-12-09 NOTE — Plan of Care (Signed)
  Problem: Nutrition: Goal: Adequate nutrition will be maintained Outcome: Completed/Met   Problem: Elimination: Goal: Will not experience complications related to urinary retention Outcome: Completed/Met   Problem: Skin Integrity: Goal: Risk for impaired skin integrity will decrease Outcome: Completed/Met

## 2018-12-09 NOTE — Progress Notes (Signed)
PROGRESS NOTE  BEETA DINIS [id number] DOB: 1935/01/21 DOA: 12/07/2018 PCP: Leonard Downing, MD  Brief History    STORY MUSSELL is a 84 y.o. female with medical history significant of HTN, numerous prior syncopal episodes.  Patient presents to the ED after a syncopal episode.  Had syncopal episode in May, and another episode x2 weeks ago.  Happened again today.  All 3 of these episodes have been when she was on the commode for BM, just finished using the bathroom and then she stands up, feels weak, sits back down, and passes out.  After each episode EMS is called, and she is typically documented as being bradycardic and hypotensive on EMS arrival.  She is documented as bradycardic and hypoxic (the latter oddly enough) today.  Did have episode of L-sided CP yesterday.  Does have generalized abd pain, not new for her, c/w prior episodes of gastritis.   ED Course: Vital signs normal in ED and she is asymptomatic at this time.  The patient was admitted to a telemetry bed. Cardiology was consulted and feels that she is passing out due to vasovagal syncope. Orthostatic blood pressures are equivocal and her blood pressures are labile. Echocardiogram was obtained that demonstrates EF 60-65% with normal function and evidence for diastolic dysfunction. There was mildly elevated pulmonary artery systolic pressure. MI has been ruled out by enzyme and EKG criteria. Cardiology does not recommend any changes to her blood pressure medications for this reason. She has been evaluated by PT/OT. They have recommended SNF. Will consult social work.  Consultants  . Cardiology  Procedures  . None  Antibiotics   Anti-infectives (From admission, onward)   None    .  Subjective  The patient is resting comfortably. No new complaints. She states that she feels weak when she gets up.  Objective   Vitals:  Vitals:   12/09/18 0600 12/09/18 1651  BP: 127/70 108/64  Pulse: (!) 46 61   Resp: 16 20  Temp: 97.6 F (36.4 C) 97.8 F (36.6 C)  SpO2: 93% 92%   Exam:  Constitutional:  . The patient is awake, alert, and oriented x 3. No acute distress. She is frail, elderly, and weak. Respiratory:  . No increased work of breathing. . No wheezes, rales, or rhonchi . No tactile fremitus Cardiovascular:  . Regular rate and rhythm . No murmurs, ectopy, or gallups. . No lateral PMI. No thrills. Abdomen:  . Abdomen is soft, non-tender, non-distended . No hernias, masses, or organomegaly . Normoactive bowel sounds.  Musculoskeletal:  . No cyanosis, clubbing, or edema Skin:  . No rashes, lesions, ulcers . palpation of skin: no induration or nodules Neurologic:  . CN 2-12 intact . Sensation all 4 extremities intact Psychiatric:  . Mental status o Mood, affect appropriate o Orientation to person, place, time  . judgment and insight appear intact  I have personally reviewed the following:   Today's Data  . CMP, CBC, Troponins.  Cardiology Data  . Echocardiogram  Scheduled Meds: . artificial tears   Both Eyes QHS  . busPIRone  22.5 mg Oral BID  . calcium-vitamin D  1 tablet Oral Daily  . dicyclomine  20 mg Oral TID  . donepezil  10 mg Oral QHS  . enoxaparin (LOVENOX) injection  40 mg Subcutaneous Q24H  . famotidine  20 mg Oral BID  . fluticasone  1 spray Each Nare BID  . gabapentin  300 mg Oral BID  . hydrOXYzine  25 mg Oral  TID  . levothyroxine  100 mcg Oral Q0600  . loratadine  10 mg Oral Daily  . LORazepam  0.5 mg Oral QPM  . multivitamin with minerals  1 tablet Oral Daily  . pantoprazole  80 mg Oral BID AC  . sertraline  100 mg Oral Daily  . sodium chloride flush  3 mL Intravenous Q12H  . traZODone  200 mg Oral QHS   Continuous Infusions:  Principal Problem:   Syncope Active Problems:   Hypertension   Hypothyroidism   Syncope and collapse   LOS: 0 days   A & P   Syncope: Vasovagal per cardiology. They recommend that the patient remember  to go and lie down when she feels the prodrome before she passes out. Echocardiogram demonstrates only diastolic dysfunction. Orthostatics are equivocal and blood pressures are labile. Cardiology does not wish to do anything with her blood pressure medications at this time.   Frequent Falls/Debility: The patient has been evaluated by PT/OT. They feel that she is appropriate to go to SNF prior to home. I have consulted social work for this reason.  HTN: Quite labile. No changes to home BP meds per cardiology for this reason.  Hypothyroidism: Continue synthroid as at home.  I have seen and examined this patient myself. I have spent 45 minutes in her evaluation and care.  DVT prophylaxis: Lovenox Code Status: DNR Family Communication: I have discussed the patient in detail with her son, [name] Disposition Plan: Back to [facility] after admission Naoko Diperna, DO [facility] Direct contact: see [email or web address]  7PM-7AM contact night coverage as above 12/09/2018, 5:13 PM  LOS: 0 days

## 2018-12-10 DIAGNOSIS — R42 Dizziness and giddiness: Secondary | ICD-10-CM

## 2018-12-10 DIAGNOSIS — R55 Syncope and collapse: Secondary | ICD-10-CM | POA: Diagnosis not present

## 2018-12-10 LAB — GLUCOSE, CAPILLARY: Glucose-Capillary: 99 mg/dL (ref 70–99)

## 2018-12-10 NOTE — Progress Notes (Signed)
PROGRESS NOTE  Erika Conley [id number] DOB: Mar 17, 1934 DOA: 12/07/2018 PCP: Leonard Downing, MD  Brief History    CALLAWAY DUCHON is a 84 y.o. female with medical history significant of HTN, numerous prior syncopal episodes.  Patient presents to the ED after a syncopal episode.  Had syncopal episode in May, and another episode x2 weeks ago.  Happened again today.  All 3 of these episodes have been when she was on the commode for BM, just finished using the bathroom and then she stands up, feels weak, sits back down, and passes out.  After each episode EMS is called, and she is typically documented as being bradycardic and hypotensive on EMS arrival.  She is documented as bradycardic and hypoxic (the latter oddly enough) today.  Did have episode of L-sided CP yesterday.  Does have generalized abd pain, not new for her, c/w prior episodes of gastritis.   ED Course: Vital signs normal in ED and she is asymptomatic at this time.  The patient was admitted to a telemetry bed. Cardiology was consulted and feels that she is passing out due to vasovagal syncope. Orthostatic blood pressures are equivocal and her blood pressures are labile. Echocardiogram was obtained that demonstrates EF 60-65% with normal function and evidence for diastolic dysfunction. There was mildly elevated pulmonary artery systolic pressure. MI has been ruled out by enzyme and EKG criteria. Cardiology does not recommend any changes to her blood pressure medications for this reason. She has been evaluated by PT/OT. They have recommended SNF. OT saw the patient this am and they feel that the patient has BPPV. They have alerted PT vestibular treatment team.  Consultants  . Cardiology  Procedures  . None  Antibiotics   Anti-infectives (From admission, onward)   None     Subjective  The patient is resting comfortably. No new complaints. She states that she is feeling very dizzy.  Objective   Vitals:   Vitals:   12/10/18 0408 12/10/18 1140  BP: 127/67 139/72  Pulse: (!) 55 (!) 51  Resp: 18 18  Temp: 98.2 F (36.8 C) 98.4 F (36.9 C)  SpO2: 90% 96%   Exam:  Constitutional:  . The patient is awake, alert, and oriented x 3. No acute distress. She is frail, elderly, and weak. She states that she is very dizzy. Respiratory:  . No increased work of breathing. . No wheezes, rales, or rhonchi . No tactile fremitus Cardiovascular:  . Regular rate and rhythm . No murmurs, ectopy, or gallups. . No lateral PMI. No thrills. Abdomen:  . Abdomen is soft, non-tender, non-distended . No hernias, masses, or organomegaly . Normoactive bowel sounds.  Musculoskeletal:  . No cyanosis, clubbing, or edema Skin:  . No rashes, lesions, ulcers . palpation of skin: no induration or nodules Neurologic:  . CN 2-12 intact . Sensation all 4 extremities intact Psychiatric:  . Mental status o Mood, affect appropriate o Orientation to person, place, time  . judgment and insight appear intact  I have personally reviewed the following:   Today's Data  . Vitals  Cardiology Data  . Echocardiogram  Scheduled Meds: . artificial tears   Both Eyes QHS  . busPIRone  22.5 mg Oral BID  . calcium-vitamin D  1 tablet Oral Daily  . dicyclomine  20 mg Oral TID  . donepezil  10 mg Oral QHS  . enoxaparin (LOVENOX) injection  40 mg Subcutaneous Q24H  . famotidine  20 mg Oral BID  . fluticasone  1 spray Each Nare BID  . gabapentin  300 mg Oral BID  . hydrOXYzine  25 mg Oral TID  . levothyroxine  100 mcg Oral Q0600  . loratadine  10 mg Oral Daily  . LORazepam  0.5 mg Oral QPM  . multivitamin with minerals  1 tablet Oral Daily  . pantoprazole  80 mg Oral BID AC  . sertraline  100 mg Oral Daily  . sodium chloride flush  3 mL Intravenous Q12H  . traZODone  200 mg Oral QHS   Continuous Infusions:  Principal Problem:   Syncope Active Problems:   Hypertension   Hypothyroidism   Syncope and collapse    LOS: 1 day   A & P   Syncope: Vasovagal per cardiology. They recommend that the patient remember to go and lie down when she feels the prodrome before she passes out. Echocardiogram demonstrates only diastolic dysfunction. Orthostatics are equivocal and blood pressures are labile. Cardiology does not wish to do anything with her blood pressure medications at this time.   Frequent Falls/Debility: The patient has been evaluated by PT/OT. They feel that she is appropriate to go to SNF prior to home. I have consulted social work for this reason.  BPPV: As per occupational therapy. PT to try vestibular therapy on patient.  HTN: Quite labile. No changes to home BP meds per cardiology for this reason.  Hypothyroidism: Continue synthroid as at home.  I have seen and examined this patient myself. I have spent 28 minutes in her evaluation and care.  DVT prophylaxis: Lovenox Code Status: DNR Family Communication: I have discussed the patient in detail with her son, [name] Disposition Plan: Back to [facility] after admission  Kinze Labo, DO [facility] Direct contact: see [email or web address]  7PM-7AM contact night coverage as above 12/10/2018, 3:00 PM  LOS: 0 days

## 2018-12-10 NOTE — Evaluation (Signed)
Occupational Therapy Evaluation Patient Details Name: Erika Conley MRN: [id number] DOB: 07/01/1934 Today's Date: 12/10/2018    History of Present Illness Pt is an 83 y/o female admitted following syncopal episode. PMH includes HTN, orthostatic hypotension, fibromyalgia, and vertigo.    Clinical Impression   Pt admitted with above diagnoses, presenting with dizziness and nausea with functional mobility that compromises safe BADL completion. PTA pt lived at [place], having assist with bathing. She is able to dress herself and complete functional mobility around her apartment. At time of eval, pt is met on BSC just having BM. Per history, pt having syncopal episodes after BM. She was able to stand and complete peri care at set up A, then needing to sit EOB and ultimately lie down 2/2 dizziness and nausea. BP not extremely remarkable for orthostasis, suspect more BPPV symptoms. Information referred to vestibular PT for continued evaluation for suspected BPPV. At this time recommending HHOT at d/c with returning to ALF. Will continue to follow acutely per POC listed below.     Follow Up Recommendations  Home health OT;Other (comment)(return to ALF with HHOT)    Equipment Recommendations  3 in 1 bedside commode    Recommendations for Other Services       Precautions / Restrictions Precautions Precautions: Fall Precaution Comments: dizziness with movement Restrictions Weight Bearing Restrictions: No      Mobility Bed Mobility               General bed mobility comments: on BSC on arrival  Transfers Overall transfer level: Needs assistance Equipment used: None Transfers: Stand Pivot Transfers   Stand pivot transfers: Min guard       General transfer comment: min guard for safety, with onset of dizziness after transfer from [facility] to bed, used bed rail to steady self back to EOB    Balance Overall balance assessment: Needs assistance Sitting-balance  support: No upper extremity supported;Feet supported Sitting balance-Leahy Scale: Fair     Standing balance support: Bilateral upper extremity supported;During functional activity Standing balance-Leahy Scale: Poor Standing balance comment: Reliant on UE and external support. limited by dizziness                           ADL either performed or assessed with clinical judgement   ADL Overall ADL's : Needs assistance/impaired Eating/Feeding: Set up;Sitting   Grooming: Set up;Sitting Grooming Details (indicate cue type and reason): dizziness with standing Upper Body Bathing: Minimal assistance;Sitting   Lower Body Bathing: Minimal assistance;Sit to/from stand;Sitting/lateral leans   Upper Body Dressing : Set up;Sitting   Lower Body Dressing: Set up;Sit to/from stand;Sitting/lateral leans   Toilet Transfer: Min Public librarian Details (indicate cue type and reason): on BSC on arrival by bed, min guard for safety with onset of dizziness after t/f Toileting- Clothing Manipulation and Hygiene: Set up;Sit to/from stand Toileting - Clothing Manipulation Details (indicate cue type and reason): standing to complete peri care with BM, set up for supply Tub/ Shower Transfer: Min guard;Shower seat;Ambulation   Functional mobility during ADLs: Supervision/safety;Rolling walker General ADL Comments: overall min guard- supervision for safety considering sudden onset of dizziness that compromises safety in BADL     Vision Baseline Vision/History: No visual deficits Additional Comments: having vertigo like symptoms with hx of vertigo. Information given to vestibular PT for eval     Perception     Praxis      Pertinent Vitals/Pain Pain Assessment: Faces Faces  Pain Scale: Hurts a little bit Pain Location: head with dizziness onset Pain Descriptors / Indicators: Aching Pain Intervention(s): Limited activity within patient's tolerance;Patient requesting pain  meds-RN notified;Monitored during session     Hand Dominance     Extremity/Trunk Assessment Upper Extremity Assessment Upper Extremity Assessment: Generalized weakness   Lower Extremity Assessment Lower Extremity Assessment: Defer to PT evaluation       Communication Communication Communication: No difficulties   Cognition Arousal/Alertness: Awake/alert Behavior During Therapy: WFL for tasks assessed/performed Overall Cognitive Status: Within Functional Limits for tasks assessed                                     General Comments       Exercises     Shoulder Instructions      Home Living Family/patient expects to be discharged to:: Assisted living                             Home Equipment: Walker - 4 wheels   Additional Comments: from [facility]      Prior Functioning/Environment Level of Independence: Needs assistance  Gait / Transfers Assistance Needed: Reports she is mod I with use of rollator for mobility tasks.  ADL's / Homemaking Assistance Needed: Reports aide has to assist with shower transfers and bathing. Otherwise dresses self, walks around room            OT Problem List: Decreased knowledge of use of DME or AE;Decreased activity tolerance;Impaired balance (sitting and/or standing);Decreased safety awareness;Pain      OT Treatment/Interventions: Self-care/ADL training;Therapeutic exercise;Patient/family education;Balance training;Energy conservation;Therapeutic activities;DME and/or AE instruction    OT Goals(Current goals can be found in the care plan section) Acute Rehab OT Goals Patient Stated Goal: to feel better OT Goal Formulation: With patient Time For Goal Achievement: 12/24/18 Potential to Achieve Goals: Good  OT Frequency:     Barriers to D/C:            Co-evaluation              AM-PAC OT "6 Clicks" Daily Activity     Outcome Measure Help from another person eating meals?: A  Little Help from another person taking care of personal grooming?: A Little Help from another person toileting, which includes using toliet, bedpan, or urinal?: A Little Help from another person bathing (including washing, rinsing, drying)?: A Little Help from another person to put on and taking off regular upper body clothing?: A Little Help from another person to put on and taking off regular lower body clothing?: A Little 6 Click Score: 18   End of Session Equipment Utilized During Treatment: Gait belt Nurse Communication: Mobility status  Activity Tolerance: Patient tolerated treatment well Patient left: in bed;with call bell/phone within reach;with bed alarm set  OT Visit Diagnosis: Unsteadiness on feet (R26.81);BPPV;Other abnormalities of gait and mobility (R26.89);Pain Pain - part of body: (HA)                Time: 0940-7680 OT Time Calculation (min): 24 min Charges:  OT General Charges $OT Visit: 1 Visit OT Evaluation $OT Eval Moderate Complexity: [place], MSOT, OTR/L [place] OT/ Acute Relief OT [facility] Office: [phone]   Zenovia Jarred 12/10/2018, 10:27 AM

## 2018-12-10 NOTE — Plan of Care (Signed)
  Problem: Education: Goal: Knowledge of General Education information will improve Description: Including pain rating scale, medication(s)/side effects and non-pharmacologic comfort measures Outcome: Progressing   Problem: Health Behavior/Discharge Planning: Goal: Ability to manage health-related needs will improve Outcome: Progressing   Problem: Clinical Measurements: Goal: Ability to maintain clinical measurements within normal limits will improve Outcome: Progressing Goal: Will remain free from infection Outcome: Progressing Goal: Diagnostic test results will improve Outcome: Progressing Goal: Respiratory complications will improve Outcome: Progressing Goal: Cardiovascular complication will be avoided Outcome: Progressing   Problem: Activity: Goal: Risk for activity intolerance will decrease Outcome: Progressing   Problem: Coping: Goal: Level of anxiety will decrease Outcome: Progressing   Problem: Elimination: Goal: Will not experience complications related to bowel motility Outcome: Progressing   Problem: Pain Managment: Goal: General experience of comfort will improve Outcome: Progressing   Problem: Safety: Goal: Ability to remain free from injury will improve Outcome: Progressing   

## 2018-12-10 NOTE — Progress Notes (Signed)
Physical Therapy Treatment Note  Patient seen for mobility progression. Pt requires min guard for safety with OOB mobility. Pt is limited by c/o dizziness ("room spinning"), nausea, and onset of headache  after transfer from [facility] after BM. Pt would benefit from vestibular eval given symptoms and history of vertigo. PT will continue to follow and progress as tolerated.   BP in sitting beginning of session 131/81 (98)  BP in sitting after standing for hygiene and SPT to EOB 150/63 (91) BP in supine as pt reports room spinning and requested to lie down 154/63 (88) Pulse 54-60    12/10/18 1141  PT Visit Information  Last PT Received On 12/10/18  Assistance Needed +2 (for safety with amb)  PT/OT/SLP Co-Evaluation/Treatment Yes  Reason for Co-Treatment Complexity of the patient's impairments (multi-system involvement);For patient/therapist safety  PT goals addressed during session Mobility/safety with mobility  History of Present Illness Pt is an 84 y/o female admitted following syncopal episode. PMH includes HTN, orthostatic hypotension, fibromyalgia, and vertigo.   Subjective Data  Patient Stated Goal to feel better  Precautions  Precautions Fall  Precaution Comments dizziness with movement  Restrictions  Weight Bearing Restrictions No  Pain Assessment  Pain Assessment Faces  Faces Pain Scale 2  Pain Location head with dizziness onset  Pain Descriptors / Indicators Aching  Pain Intervention(s) Monitored during session;Limited activity within patient's tolerance;Repositioned  Cognition  Arousal/Alertness Awake/alert  Behavior During Therapy WFL for tasks assessed/performed  Overall Cognitive Status Within Functional Limits for tasks assessed  Bed Mobility  Overal bed mobility Needs Assistance  Bed Mobility Sit to Supine  Sit to supine Supervision  General bed mobility comments on BSC on arrival; supervision for return to supine   Transfers  Overall transfer level Needs assistance   Equipment used None  Transfers Stand Pivot Transfers  Stand pivot transfers Min guard  General transfer comment min guard for safety, with onset of dizziness after transfer from [facility] to bed, used bed rail to steady self back to EOB  Ambulation/Gait  General Gait Details unable to progress gait due to c/o "room spinning", dizziness, nausea, and onset of headache  Balance  Overall balance assessment Needs assistance  Sitting-balance support No upper extremity supported;Feet supported  Sitting balance-Leahy Scale Fair  Standing balance support Bilateral upper extremity supported;During functional activity  Standing balance-Leahy Scale Poor  Standing balance comment Reliant on UE and external support. limited by dizziness  General Comments  General comments (skin integrity, edema, etc.) pt had BM and then symptomatic after transfer from [facility]  PT - End of Session  Equipment Utilized During Treatment Gait belt  Activity Tolerance Treatment limited secondary to medical complications (Comment) (dizziness, lightheadedness)  Patient left in bed;with call bell/phone within reach;with bed alarm set  Nurse Communication Mobility status;Other (comment) (pt requests anti emesis medication)   PT - Assessment/Plan  PT Plan Current plan remains appropriate  PT Visit Diagnosis Unsteadiness on feet (R26.81);Muscle weakness (generalized) (M62.81);Dizziness and giddiness (R42)  PT Frequency (ACUTE ONLY) Min 2X/week  Follow Up Recommendations SNF;Supervision/Assistance - 24 hour (unless ALF able to provide necessary support )  PT equipment Wheelchair (measurements PT);Wheelchair cushion (measurements PT)  AM-PAC PT "6 Clicks" Mobility Outcome Measure (Version 2)  Help needed turning from your back to your side while in a flat bed without using bedrails? 3  Help needed moving from lying on your back to sitting on the side of a flat bed without using bedrails? 3  Help needed moving to and from  a bed to a  chair (including a wheelchair)? 3  Help needed standing up from a chair using your arms (e.g., wheelchair or bedside chair)? 3  Help needed to walk in hospital room? 2  Help needed climbing 3-5 steps with a railing?  2  6 Click Score 16  Consider Recommendation of Discharge To: Home with [facility]  PT Goal Progression  Progress towards PT goals Progressing toward goals  PT Time Calculation  PT Start Time (ACUTE ONLY) 0913  PT Stop Time (ACUTE ONLY) 0937  PT Time Calculation (min) (ACUTE ONLY) 24 min  PT General Charges  $$ ACUTE PT VISIT 1 Visit  PT Treatments  $Gait Training 8-22 mins   Earney Navy, PTA Acute Rehabilitation Services Pager: [phone] Office: [phone]

## 2018-12-10 NOTE — TOC Initial Note (Signed)
Transition of Care [facility]) - Initial/Assessment Note    Patient Details  Name: Erika Conley MRN: [id number] Date of Birth: 04/16/1934  [facility]) CM/SW Contact:    Alberteen Sam, [place] Phone Number: [phone] 12/10/2018, 4:05 PM  Clinical Narrative:                  CSW consulted with patient son Erika Conley regarding discharge planning, he reports in the past patient has continued to go back to [place] and reports he would like her to return there at discharge. CSW has lvm with [facility] admissions Wells Guiles in order to receive fax number to send clinicals, ensuring patient can return. Awaiting call back at this time.   Expected Discharge Plan: Assisted Living Barriers to Discharge: Continued Medical Work up   Patient Goals and CMS Choice Patient states their goals for this hospitalization and ongoing recovery are:: to go back to ALF CMS Medicare.gov Compare Post Acute Care list provided to:: Patient Represenative (must comment)(patient's son Erika Conley) Choice offered to / list presented to : Adult Children  Expected Discharge Plan and Services Expected Discharge Plan: Assisted Living       Living arrangements for the past 2 months: Assisted Living Facility([facility])                                      Prior Living Arrangements/Services Living arrangements for the past 2 months: Assisted Living Facility([facility]) Lives with:: Self Patient language and need for interpreter reviewed:: Yes Do you feel safe going back to the place where you live?: Yes      Need for Family Participation in Patient Care: Yes (Comment) Care giver support system in place?: Yes (comment)   Criminal Activity/Legal Involvement Pertinent to Current Situation/Hospitalization: No - Comment as needed  Activities of Daily Living Home Assistive Devices/Equipment: Walker (specify type)(4 wheel) ADL Screening (condition at time of admission) Patient's  cognitive ability adequate to safely complete daily activities?: Yes Is the patient deaf or have difficulty hearing?: No Does the patient have difficulty seeing, even when wearing glasses/contacts?: No Does the patient have difficulty concentrating, remembering, or making decisions?: No Patient able to express need for assistance with ADLs?: Yes Does the patient have difficulty dressing or bathing?: Yes Independently performs ADLs?: No Communication: Independent Dressing (OT): Independent Grooming: Independent Feeding: Independent Bathing: Needs assistance Is this a change from baseline?: Pre-admission baseline Toileting: Independent In/Out Bed: Independent Walks in Home: Independent Does the patient have difficulty walking or climbing stairs?: No Weakness of Legs: Both Weakness of Arms/Hands: None  Permission Sought/Granted Permission sought to share information with : Case Manager, Customer service manager, Family Supports Permission granted to share information with : Yes, Verbal Permission Granted  Share Information with NAME: Erika Conley  Permission granted to share info w AGENCY: ALFs  Permission granted to share info w Relationship: son  Permission granted to share info w Contact Information: [phone]  Emotional Assessment   Attitude/Demeanor/Rapport: Unable to Assess Affect (typically observed): Unable to Assess Orientation: : Oriented to Self, Oriented to Place, Oriented to  Time, Oriented to Situation Alcohol / Substance Use: Not Applicable Psych Involvement: No (comment)  Admission diagnosis:  Syncope [R55] Syncope, unspecified syncope type [R55] Patient Active Problem List   Diagnosis Date Noted  . Syncope and collapse 12/09/2018  . Syncope 12/08/2018  . Nausea and vomiting 12/14/2015  . Hyponatremia  12/13/2015  . Obstructive jaundice   . Bile duct stricture   . Pancreatitis 10/23/2015  . Abdominal pain 10/22/2015  . Pancreatic mass 10/22/2015  . Common  bile duct (CBD) obstruction   . Abnormal LFTs   . Dehydration with hyponatremia 04/09/2015  . Acute respiratory failure with hypoxia ([place]) 04/09/2015  . Thrombocytopenia ([place]) 04/09/2015  . Hypokalemia 11/10/2014  . Hypertension 11/10/2014  . Hypothyroidism 11/10/2014  . Syncope due to orthostatic hypotension 11/10/2014  . Constipated   . UTI (urinary tract infection) 06/04/2013  . Altered mental status 06/04/2013   PCP:  Leonard Downing, MD Pharmacy:  No Pharmacies Listed    Social Determinants of Health (SDOH) Interventions    Readmission Risk Interventions No flowsheet data found.

## 2018-12-10 NOTE — Progress Notes (Signed)
Physical Therapy Treatment/Vestibular Assessment Patient Details Name: Erika Conley MRN: [id number] DOB: 11-15-1934 Today's Date: 12/10/2018    History of Present Illness Pt is an 84 y/o female admitted following syncopal episode. PMH includes HTN, orthostatic hypotension, fibromyalgia, and vertigo.     PT Comments    Pt does show signs of posterior canal BPPV, most suspicious for R side (most symptomatic with testing despite L side producing nystagmus).  We did Brandt daroff x 1 round and will attempt Marye Round with Epleys to the R tomorrow if she is still here.  PT will continue to follow acutely for safe mobility progression.  I updated her plan based on SW note re: d/c back to her ALF.    Follow Up Recommendations  Home health PT;Other (comment)(at her ALF, request vestibular PT)     Equipment Recommendations  Wheelchair (measurements PT);Wheelchair cushion (measurements PT);Other (comment)(can ALF provide WC?)    Recommendations for Other Services   NA     Precautions / Restrictions Precautions Precautions: Fall Precaution Comments: dizziness with movement and h/o falls    Mobility  Bed Mobility Overal bed mobility: Needs Assistance Bed Mobility: Sidelying to Sit;Supine to Sit   Sidelying to sit: Min guard   Sit to supine: Supervision   General bed mobility comments: Min guard assist to support trunk when coming up to sitting, especially after vestibular testing as she would get dizzy, supervision for return to bed, both directions she is heavily reliant on the rail (she does not have rails or hospital bed at her ALF).   Transfers Overall transfer level: Needs assistance Equipment used: Rolling walker (2 wheeled) Transfers: Sit to/from Stand Sit to Stand: Min assist         General transfer comment: Min assist to come to standing, stabilize, cues to find a target and not to move away from the bed until her dizziness settles.    Ambulation/Gait Ambulation/Gait assistance: Min assist Gait Distance (Feet): 3 Feet Assistive device: Rolling walker (2 wheeled) Gait Pattern/deviations: Step-to pattern     General Gait Details: Pt was able to march in place and take ~4-5 side steps up to [facility] with RW, min assist for balance and steadying the RW          Balance Overall balance assessment: Needs assistance Sitting-balance support: Feet supported;Bilateral upper extremity supported Sitting balance-Leahy Scale: Fair Sitting balance - Comments: supervision EOB   Standing balance support: Bilateral upper extremity supported Standing balance-Leahy Scale: Poor Standing balance comment: reliant on RW and therapist's assist.                12/10/18 0001  Vestibular Assessment  General Observation Pt half seated EOB with HOB raised when PT entered the room,  Symptom Behavior  Subjective history of current problem dizziness with movement, recent ear infection, no tinnitus, no fullness, some h/o head trauma, but none recently, no recent antibiotic use or medication changes, no double vision, wears glasses to see (they are at home).   Type of Dizziness  Spinning  Frequency of Dizziness with movement  Duration of Dizziness short <30  Symptom Nature Motion provoked;Positional;Intermittent  Aggravating Factors Activity in general;Lying supine;Turning head quickly;Sit to stand  Relieving Factors Head stationary;Lying supine;Closing eyes;Slow movements  Progression of Symptoms Better  History of similar episodes yes  Oculomotor Exam  Oculomotor Alignment Normal  Spontaneous Absent  Gaze-induced  Absent  Smooth Pursuits Intact  Saccades Intact  Vestibulo-Ocular Reflex  VOR 1 Head Only (x 1 viewing) intact  Auditory  Comments grossly equal  Positional Testing  Sidelying Test Sidelying Right;Sidelying Left  Sidelying Right  Sidelying Right Duration <20  Sidelying Right Symptoms No nystagmus;Other  (comment) (more symptomatic than L)  Sidelying Left  Sidelying Left Duration 30  Sidelying Left Symptoms Other (comment) (rotational, fleeting, less symptomatic)  Orthostatics  Orthostatics Comment see vitals flow sheet.  Taken earlier in the day and negative                   Cognition Arousal/Alertness: Awake/alert Behavior During Therapy: WFL for tasks assessed/performed Overall Cognitive Status: Within Functional Limits for tasks assessed                                               Pertinent Vitals/Pain Pain Assessment: Faces Faces Pain Scale: Hurts even more Pain Location: posterior head and neck (tension) Pain Descriptors / Indicators: Aching Pain Intervention(s): Limited activity within patient's tolerance;Monitored during session;Repositioned;Heat applied           PT Goals (current goals can now be found in the care plan section) Acute Rehab PT Goals Patient Stated Goal: to feel better Progress towards PT goals: Progressing toward goals    Frequency    Min 4X/week(vestibular)      PT Plan Discharge plan needs to be updated       AM-PAC PT "6 Clicks" Mobility   Outcome Measure  Help needed turning from your back to your side while in a flat bed without using bedrails?: A Little Help needed moving from lying on your back to sitting on the side of a flat bed without using bedrails?: A Little Help needed moving to and from a bed to a chair (including a wheelchair)?: A Little Help needed standing up from a chair using your arms (e.g., wheelchair or bedside chair)?: A Little Help needed to walk in hospital room?: A Little Help needed climbing 3-5 steps with a railing? : A Lot 6 Click Score: 17    End of Session   Activity Tolerance: Other (comment)(limited by nausea ) Patient left: in bed;with call bell/phone within reach;with bed alarm set   PT Visit Diagnosis: Unsteadiness on feet (R26.81);Muscle weakness (generalized)  (M62.81);Dizziness and giddiness (R42)     Time: [id number] PT Time Calculation (min) (ACUTE ONLY): 30 min  Charges:  $Therapeutic Activity: 8-22 mins 1 re-eval                   Alexandrea Westergard B. Jamani Bearce, PT, DPT  Acute Rehabilitation [phone] pager [phone] office  @ [place]: [phone]   12/10/2018, 5:32 PM

## 2018-12-11 DIAGNOSIS — R55 Syncope and collapse: Secondary | ICD-10-CM | POA: Diagnosis not present

## 2018-12-11 LAB — GLUCOSE, CAPILLARY: Glucose-Capillary: 94 mg/dL (ref 70–99)

## 2018-12-11 LAB — SARS CORONAVIRUS 2 (TAT 6-24 HRS): SARS Coronavirus 2: NEGATIVE

## 2018-12-11 NOTE — TOC Progression Note (Signed)
Transition of Care [facility]) - Progression Note    Patient Details  Name: Erika Conley MRN: [id number] Date of Birth: 10/15/1934  Transition of Care [facility]) CM/SW [place] Phone Number: [phone] 12/11/2018, 8:45 AM  Clinical Narrative:     CSW spoke with Wells Guiles at [facility] who reports they can accept patient back and are able to meet her needs. They request clinicals be faxed to [phone]. CSW informed Wells Guiles of last COVID test from 10/18 being negative, she spoke with their DON who reports patient will need updated COVID test prior to discharge. CSW has paged MD to inform.   Expected Discharge Plan: Assisted Living Barriers to Discharge: Continued Medical Work up  Expected Discharge Plan and Services Expected Discharge Plan: Assisted Living       Living arrangements for the past 2 months: Assisted Living Facility([facility])                                       Social Determinants of Health (SDOH) Interventions    Readmission Risk Interventions No flowsheet data found.

## 2018-12-11 NOTE — Progress Notes (Signed)
PROGRESS NOTE    Erika Conley  [id number] DOB: 16-Dec-1934 DOA: 12/07/2018 PCP: Leonard Downing, MD    Brief Narrative:  Erika Conley a 84 y.o.femalewith medical history significant ofHTN, numerous prior syncopal episodes.  Patient presents to the ED after a syncopal episode. Had syncopal episode in May, and another episode x2 weeks ago. Happened again today. All 3 of these episodes have been when she was on the commode for BM, just finished using the bathroom and then she stands up, feels weak, sits back down, and passes out.After each episode EMS is called, and she is typically documented as being bradycardic and hypotensive on EMS arrival. She is documented as bradycardic and hypoxic (the latter oddly enough) today.  Did have episode of L-sided CP yesterday. Does have generalized abd pain, not new for her (has IBS with frequent diarrhea), also with history of prior episodes of gastritis and similar symptoms.  In the ED, patient had stable vitals and was asymptomatic.    The patient was admitted to a telemetry bed. Cardiology was consulted and feels that she is passing out due to vasovagal syncope. Orthostatic blood pressures are equivocal and her blood pressures are labile. Echocardiogram was obtained that demonstrates EF 60-65% with normal function and evidence for diastolic dysfunction. There was mildly elevated pulmonary artery systolic pressure. MI has been ruled out by enzyme and EKG criteria. Cardiology does not recommend any changes to her blood pressure medications for this reason. She has been evaluated by PT/OT. They have recommended SNF. OT saw the patient this am and they feel that the patient has BPPV. They have alerted PT vestibular treatment team.   Assessment & Plan:   Principal Problem:   Syncope Active Problems:   Hypertension   Hypothyroidism   Syncope and collapse   Syncope: Vasovagal per cardiology, consistently occur with BM's. They  recommend that the patient remember to go and lie down when she feels the prodrome before she passes out. Echocardiogram demonstrates only diastolic dysfunction. Orthostatics are equivocal and blood pressures are labile. Cardiology does not wish to do anything with her blood pressure medications at this time.  - fall precautions - compression stockings  Frequent Falls/Debility: The patient has been evaluated by PT/OT. Recommend SNF for rehab prior to home.  - accepted today, COVID test pending  BPPV: As per occupational therapy. PT to try vestibular therapy on patient.  Trial of meclizine.  Hypertension: Quite labile. No changes to home BP meds per cardiology for this reason.  Hypothyroidism: Continue synthroid as at home.  Irritable Bowel Syndrome: patient reports frequent diarrhea, at least 3 times/week, and intermittent abdominal pain. - cont imodium, simethicone PRN  Cervical spine arthritis: chronic, stable.  Continue heating pad as needed. Tylenol PRN.   DVT prophylaxis: Lovenox Code Status:   Code Status: DNR Family Communication:  Disposition Plan:  D/C to SNF/ALF tomorrow, pending negative covid test   Consultants:   Cardiology  Procedures:   Echo 10/18 EF 60-65%  Antimicrobials:   none    Subjective: Patient seen and examined, awake laying in bed with heat pad on side of her neck.  States her neck arthritis "acting up" and giving her a headache this morning.  Reports diarrhea, but normal for her with IBS.  No fevers/chills.  Still feels quite unsteady on her feet.    Objective: Vitals:   12/10/18 1932 12/11/18 0532 12/11/18 0846 12/11/18 1141  BP: (!) 141/53 (!) 134/57 (!) 167/73 136/63  Pulse: 61 (!)  57 60 (!) 49  Resp: 18 18 20 18   Temp: 98.1 F (36.7 C) 98.5 F (36.9 C) 98.3 F (36.8 C) 98.6 F (37 C)  TempSrc: Oral Oral Oral Oral  SpO2: 94% 92% 93%   Weight:  64.7 kg    Height:        Intake/Output Summary (Last 24 hours) at 12/11/2018 1413  Last data filed at 12/11/2018 1002 Gross per 24 hour  Intake 1300 ml  Output 350 ml  Net 950 ml   Filed Weights   12/09/18 0600 12/10/18 0408 12/11/18 0532  Weight: 64.1 kg 64.6 kg 64.7 kg    Examination:  General exam: awake, alert, no acute distress HEENT: atraumatic, clear conjunctiva, anicteric sclera, hearing grossly normal  Respiratory system: decreased breath sounds, no wheezes, rales or rhonchi, normal respiratory effort. Cardiovascular system: normal S1/S2, RRR, no JVD, murmurs, rubs, gallops. Gastrointestinal system: soft, non-tender, non-distended abdomen, hyperactive bowel sounds. Central nervous system: alert and oriented x4. no gross focal neurologic deficits, normal speech Extremities: moves all, normal tone Skin: dry, intact, normal temperature. Psychiatry: normal mood, congruent affect, judgement and insight appear normal    Data Reviewed: I have personally reviewed following labs and imaging studies  CBC: Recent Labs  Lab 12/08/18 0035  WBC 6.2  NEUTROABS 4.4  HGB 13.6  HCT 38.6  MCV 87.9  PLT [id number]   Basic Metabolic Panel: Recent Labs  Lab 12/08/18 0035  NA 136  K 3.9  CL 95*  CO2 29  GLUCOSE 124*  BUN 9  CREATININE 0.93  CALCIUM 8.8*  MG 1.8   GFR: Estimated Creatinine Clearance: 39.2 mL/min (by C-G formula based on SCr of 0.93 mg/dL). Liver Function Tests: Recent Labs  Lab 12/08/18 0035  AST 18  ALT 11  ALKPHOS 46  BILITOT 0.4  PROT 5.5*  ALBUMIN 3.5   No results for input(s): LIPASE, AMYLASE in the last 168 hours. No results for input(s): AMMONIA in the last 168 hours. Coagulation Profile: No results for input(s): INR, PROTIME in the last 168 hours. Cardiac Enzymes: No results for input(s): CKTOTAL, CKMB, CKMBINDEX, TROPONINI in the last 168 hours. BNP (last 3 results) No results for input(s): PROBNP in the last 8760 hours. HbA1C: No results for input(s): HGBA1C in the last 72 hours. CBG: Recent Labs  Lab 12/08/18 0034  12/08/18 0537 12/09/18 0554 12/10/18 0615 12/11/18 0613  GLUCAP 122* 101* 101* 99 94   Lipid Profile: No results for input(s): CHOL, HDL, LDLCALC, TRIG, CHOLHDL, LDLDIRECT in the last 72 hours. Thyroid Function Tests: No results for input(s): TSH, T4TOTAL, FREET4, T3FREE, THYROIDAB in the last 72 hours. Anemia Panel: No results for input(s): VITAMINB12, FOLATE, FERRITIN, TIBC, IRON, RETICCTPCT in the last 72 hours. Sepsis Labs: No results for input(s): PROCALCITON, LATICACIDVEN in the last 168 hours.  Recent Results (from the past 240 hour(s))  SARS CORONAVIRUS 2 (TAT 6-24 HRS) Nasopharyngeal Nasopharyngeal Swab     Status: None   Collection Time: 12/08/18  5:39 AM   Specimen: Nasopharyngeal Swab  Result Value Ref Range Status   SARS Coronavirus 2 NEGATIVE NEGATIVE Final    Comment: (NOTE) SARS-CoV-2 target nucleic acids are NOT DETECTED. The SARS-CoV-2 RNA is generally detectable in upper and lower respiratory specimens during the acute phase of infection. Negative results do not preclude SARS-CoV-2 infection, do not rule out co-infections with other pathogens, and should not be used as the sole basis for treatment or other patient management decisions. Negative results must be combined with  clinical observations, patient history, and epidemiological information. The expected result is Negative. Fact Sheet for Patients: SugarRoll.be Fact Sheet for Healthcare Providers: https://www.woods-mathews.com/ This test is not yet approved or cleared by the [place] FDA and  has been authorized for detection and/or diagnosis of SARS-CoV-2 by FDA under an Emergency Use Authorization (EUA). This EUA will remain  in effect (meaning this test can be used) for the duration of the COVID-19 declaration under Section 56 4(b)(1) of the Act, 21 U.S.C. section 360bbb-3(b)(1), unless the authorization is terminated or revoked sooner. Performed at  [facility], [place] [address], [place], [place] [address]   MRSA PCR Screening     Status: None   Collection Time: 12/08/18  7:44 PM   Specimen: Nasal Mucosa; Nasopharyngeal  Result Value Ref Range Status   MRSA by PCR NEGATIVE NEGATIVE Final    Comment:        The GeneXpert MRSA Assay (FDA approved for NASAL specimens only), is one component of a comprehensive MRSA colonization surveillance program. It is not intended to diagnose MRSA infection nor to guide or monitor treatment for MRSA infections. Performed at [facility], [place] [address] [place], [place] [address]          Radiology Studies: No results found.      Scheduled Meds: . artificial tears   Both Eyes QHS  . busPIRone  22.5 mg Oral BID  . calcium-vitamin D  1 tablet Oral Daily  . dicyclomine  20 mg Oral TID  . donepezil  10 mg Oral QHS  . enoxaparin (LOVENOX) injection  40 mg Subcutaneous Q24H  . famotidine  20 mg Oral BID  . fluticasone  1 spray Each Nare BID  . gabapentin  300 mg Oral BID  . hydrOXYzine  25 mg Oral TID  . levothyroxine  100 mcg Oral Q0600  . loratadine  10 mg Oral Daily  . LORazepam  0.5 mg Oral QPM  . multivitamin with minerals  1 tablet Oral Daily  . pantoprazole  80 mg Oral BID AC  . sertraline  100 mg Oral Daily  . sodium chloride flush  3 mL Intravenous Q12H  . traZODone  200 mg Oral QHS   Continuous Infusions:   LOS: 2 days    Time spent: 35-40 min    Ezekiel Slocumb, DO [facility] Pager: [phone]  If 7PM-7AM, please contact night-coverage [email or web address] Password [email or web address] 12/11/2018, 2:13 PM

## 2018-12-11 NOTE — Progress Notes (Signed)
Physical Therapy Treatment Patient Details Name: Erika Conley MRN: [id number] DOB: February 10, 1935 Today's Date: 12/11/2018    History of Present Illness Pt is an 84 y/o female admitted following syncopal episode. PMH includes HTN, orthostatic hypotension, fibromyalgia, and vertigo.     PT Comments    Patient progressing with ambulation this pm and with decreased c/o of dizziness after treatment with Eply this am.  Feel she is safe for back to ALF and would recommend follow up HHPT with vestibular rehab PT.     Follow Up Recommendations  Home health PT;Other (comment)     Equipment Recommendations  Wheelchair (measurements PT);Wheelchair cushion (measurements PT);Other (comment)    Recommendations for Other Services       Precautions / Restrictions Precautions Precautions: Fall Precaution Comments: dizziness with movement and h/o falls    Mobility  Bed Mobility Overal bed mobility: Needs Assistance Bed Mobility: Sit to Supine     Supine to sit: Min assist Sit to supine: Min guard   General bed mobility comments: cues for positioning in bed  Transfers Overall transfer level: Needs assistance Equipment used: Rolling walker (2 wheeled) Transfers: Sit to/from Stand Sit to Stand: Min assist        General transfer comment: pulling up on sink where she had been sitting to wash up with NT  Ambulation/Gait Ambulation/Gait assistance: Min guard;Min assist Gait Distance (Feet): 130 Feet Assistive device: Rolling walker (2 wheeled) Gait Pattern/deviations: Step-to pattern;Step-through pattern     General Gait Details: ambulated with RW in hallway demonstrating safe turns and without c/o of vertigo   Stairs             Wheelchair Mobility    Modified Rankin (Stroke Patients Only)       Balance Overall balance assessment: Needs assistance   Sitting balance-Leahy Scale: Good Sitting balance - Comments: supervision EOB     Standing balance-Leahy Scale:  Poor Standing balance comment: holding on with one hand to sink while standing to wash perineal area.                            Cognition Arousal/Alertness: Awake/alert Behavior During Therapy: WFL for tasks assessed/performed Overall Cognitive Status: Within Functional Limits for tasks assessed                                        Exercises      General Comments       Pertinent Vitals/Pain Pain Assessment: Faces Faces Pain Scale: Hurts little more Pain Location: R neck stiffness Pain Descriptors / Indicators: Aching Pain Intervention(s): Monitored during session;Repositioned;Heat applied    Home Living                      Prior Function            PT Goals (current goals can now be found in the care plan section) Progress towards PT goals: Progressing toward goals    Frequency    Min 4X/week      PT Plan Current plan remains appropriate    Co-evaluation              AM-PAC PT "6 Clicks" Mobility   Outcome Measure  Help needed turning from your back to your side while in a flat bed without using bedrails?: A Little Help needed moving from  lying on your back to sitting on the side of a flat bed without using bedrails?: A Little Help needed moving to and from a bed to a chair (including a wheelchair)?: A Little Help needed standing up from a chair using your arms (e.g., wheelchair or bedside chair)?: A Little Help needed to walk in hospital room?: A Little Help needed climbing 3-5 steps with a railing? : A Lot 6 Click Score: 17    End of Session   Activity Tolerance: Patient tolerated treatment well Patient left: in bed;with call bell/phone within reach;with bed alarm set   PT Visit Diagnosis: Unsteadiness on feet (R26.81);Muscle weakness (generalized) (M62.81);Dizziness and giddiness (R42)     Time: 1412-1430 PT Time Calculation (min) (ACUTE ONLY): 18 min  Charges:  $Gait Training: 8-22 mins                      Magda Kiel, [place] [phone] 12/11/2018    Reginia Naas 12/11/2018, 3:30 PM

## 2018-12-11 NOTE — Progress Notes (Signed)
Physical Therapy Treatment Patient Details Name: Erika Conley MRN: [id number] DOB: 03-20-1934 Today's Date: 12/11/2018    History of Present Illness Pt is an 84 y/o female admitted following syncopal episode. PMH includes HTN, orthostatic hypotension, fibromyalgia, and vertigo.     PT Comments    Able to perform R DHP and Eply to treat supposed R posterior canal BPPV.  Patient symptomatic but unable to see any nystagmus possibly due to pre-medication with Zofran and Meclizine.  Patient not willing to undergo treatment again immediately.  Will return to attempt to check again after some mobility today as time permits.    Follow Up Recommendations  Home health PT;Other (comment)     Equipment Recommendations  Wheelchair (measurements PT);Wheelchair cushion (measurements PT);Other (comment)(can ALF provide w/c?)    Recommendations for Other Services       Precautions / Restrictions Precautions Precautions: Fall Precaution Comments: dizziness with movement and h/o falls    Mobility  Bed Mobility Overal bed mobility: Needs Assistance       Supine to sit: Min assist     General bed mobility comments: struggling to come up to sit so provided support at trunk and to scoot; positioning for DHP & Eply min A as well  Transfers Overall transfer level: Needs assistance Equipment used: 1 person hand held assist Transfers: Sit to/from [facility] Sit to Stand: Min assist Stand pivot transfers: Min assist       General transfer comment: assist to recliner after Eply and set up for lunch  Ambulation/Gait                 Stairs             Wheelchair Mobility    Modified Rankin (Stroke Patients Only)       Balance Overall balance assessment: Needs assistance   Sitting balance-Leahy Scale: Fair Sitting balance - Comments: supervision EOB     Standing balance-Leahy Scale: Poor Standing balance comment: assist while on her feet to  chair                            Cognition Arousal/Alertness: Awake/alert Behavior During Therapy: WFL for tasks assessed/performed Overall Cognitive Status: Within Functional Limits for tasks assessed                                        Exercises      General Comments General comments (skin integrity, edema, etc.): completed DHP and Eply for R post canal BPPV; no nystagmus seen as pt premedicated with Meclizine and Zofran, but reports symptoms both with R DHP and with head turn L during Eply      Pertinent Vitals/Pain Faces Pain Scale: Hurts little more Pain Location: posterior head and neck (tension) Pain Descriptors / Indicators: Aching Pain Intervention(s): Monitored during session;RN gave pain meds during session;Heat applied    Home Living                      Prior Function            PT Goals (current goals can now be found in the care plan section) Progress towards PT goals: Progressing toward goals    Frequency    Min 4X/week      PT Plan Current plan remains appropriate    Co-evaluation  AM-PAC PT "6 Clicks" Mobility   Outcome Measure  Help needed turning from your back to your side while in a flat bed without using bedrails?: A Little Help needed moving from lying on your back to sitting on the side of a flat bed without using bedrails?: A Little Help needed moving to and from a bed to a chair (including a wheelchair)?: A Little Help needed standing up from a chair using your arms (e.g., wheelchair or bedside chair)?: A Little Help needed to walk in hospital room?: A Little Help needed climbing 3-5 steps with a railing? : A Lot 6 Click Score: 17    End of Session   Activity Tolerance: Patient tolerated treatment well(but did not want to repeat treatment this session) Patient left: in chair;with call bell/phone within reach   PT Visit Diagnosis: Unsteadiness on feet (R26.81);Muscle weakness  (generalized) (M62.81);Dizziness and giddiness (R42)     Time: [id number] PT Time Calculation (min) (ACUTE ONLY): 22 min  Charges:  $Canalith Rep Proc: 8-22 mins                     Magda Kiel, PT Acute Rehabilitation Services [phone] 12/11/2018    Reginia Naas 12/11/2018, 12:58 PM

## 2018-12-11 NOTE — Plan of Care (Signed)
  Problem: Education: Goal: Knowledge of General Education information will improve Description: Including pain rating scale, medication(s)/side effects and non-pharmacologic comfort measures Outcome: Progressing   Problem: Health Behavior/Discharge Planning: Goal: Ability to manage health-related needs will improve Outcome: Progressing   Problem: Clinical Measurements: Goal: Ability to maintain clinical measurements within normal limits will improve Outcome: Progressing Goal: Will remain free from infection Outcome: Progressing Goal: Diagnostic test results will improve Outcome: Progressing Goal: Respiratory complications will improve Outcome: Progressing Goal: Cardiovascular complication will be avoided Outcome: Progressing   Problem: Activity: Goal: Risk for activity intolerance will decrease Outcome: Progressing   Problem: Coping: Goal: Level of anxiety will decrease Outcome: Progressing   Problem: Elimination: Goal: Will not experience complications related to bowel motility Outcome: Progressing   Problem: Pain Managment: Goal: General experience of comfort will improve Outcome: Progressing   Problem: Safety: Goal: Ability to remain free from injury will improve Outcome: Progressing   

## 2018-12-11 NOTE — Plan of Care (Signed)
  Problem: Education: Goal: Knowledge of General Education information will improve Description Including pain rating scale, medication(s)/side effects and non-pharmacologic comfort measures Outcome: Progressing   

## 2018-12-12 LAB — GLUCOSE, CAPILLARY
Glucose-Capillary: 98 mg/dL (ref 70–99)
Glucose-Capillary: 145 mg/dL — ABNORMAL HIGH (ref 70–99)

## 2018-12-12 MED ORDER — TRAZODONE HCL 100 MG PO TABS: 150.0000 mg | ORAL_TABLET | Freq: Every day | ORAL | 1 refills | Status: DC

## 2018-12-12 NOTE — Plan of Care (Signed)
  Problem: Education: Goal: Knowledge of General Education information will improve Description: Including pain rating scale, medication(s)/side effects and non-pharmacologic comfort measures Outcome: Progressing   Problem: Health Behavior/Discharge Planning: Goal: Ability to manage health-related needs will improve Outcome: Progressing   Problem: Clinical Measurements: Goal: Ability to maintain clinical measurements within normal limits will improve Outcome: Progressing Goal: Will remain free from infection Outcome: Progressing Goal: Diagnostic test results will improve Outcome: Progressing Goal: Respiratory complications will improve Outcome: Progressing Goal: Cardiovascular complication will be avoided Outcome: Progressing   Problem: Activity: Goal: Risk for activity intolerance will decrease Outcome: Progressing   Problem: Coping: Goal: Level of anxiety will decrease Outcome: Progressing   Problem: Elimination: Goal: Will not experience complications related to bowel motility Outcome: Progressing   Problem: Pain Managment: Goal: General experience of comfort will improve Outcome: Progressing   Problem: Safety: Goal: Ability to remain free from injury will improve Outcome: Progressing   

## 2018-12-12 NOTE — Care Management Important Message (Signed)
Important Message  Patient Details  Name: Erika Conley MRN: [id number] Date of Birth: 1935/01/04   Medicare Important Message Given:  Yes     Shelda Altes 12/12/2018, 1:27 PM

## 2018-12-12 NOTE — TOC Transition Note (Signed)
Transition of Care [facility]) - CM/SW Discharge Note   Patient Details  Name: Erika Conley MRN: [id number] Date of Birth: 1934/12/21  Transition of Care [facility]) CM/SW Contact:  Vinie Sill, [place] Phone Number: 12/12/2018, 3:10 PM   Clinical Narrative:     Patient will DC to: [place] Date: 12/11/2018 Family Notified: [name], son Transport By: Corey Harold  RN, patient, and facility notified of DC. Discharge Summary sent to facility. RN given number for [phone]. Ambulance transport requested for patient.   Clinical Social Worker signing off. Thurmond Butts, MSW, [facility] [employer] Clinical Social Worker [phone]    Final next level of care: Assisted Living Barriers to Discharge: No Barriers Identified   Patient Goals and CMS Choice Patient states their goals for this hospitalization and ongoing recovery are:: to go back to ALF CMS Medicare.gov Compare Post Acute Care list provided to:: Patient Represenative (must comment)(patient's son [name]) Choice offered to / list presented to : Adult Children  Discharge Placement              Patient chooses bed at: [facility]) Patient to be transferred to facility by: [place] Name of family member notified: Ken,son Patient and family notified of of transfer: 12/12/18  Discharge Plan and Services                                     Social Determinants of Health (SDOH) Interventions     Readmission Risk Interventions No flowsheet data found.

## 2018-12-12 NOTE — Plan of Care (Signed)
  Problem: Education: Goal: Knowledge of General Education information will improve Description: Including pain rating scale, medication(s)/side effects and non-pharmacologic comfort measures 12/12/2018 1652 by Arlina Robes, RN Outcome: Adequate for Discharge 12/12/2018 1137 by Arlina Robes, RN Outcome: Progressing 12/12/2018 1137 by Arlina Robes, RN Outcome: Progressing   Problem: Health Behavior/Discharge Planning: Goal: Ability to manage health-related needs will improve 12/12/2018 1652 by Arlina Robes, RN Outcome: Adequate for Discharge 12/12/2018 1137 by Arlina Robes, RN Outcome: Progressing 12/12/2018 1137 by Arlina Robes, RN Outcome: Progressing   Problem: Clinical Measurements: Goal: Ability to maintain clinical measurements within normal limits will improve 12/12/2018 1652 by Arlina Robes, RN Outcome: Adequate for Discharge 12/12/2018 1137 by Arlina Robes, RN Outcome: Progressing 12/12/2018 1137 by Arlina Robes, RN Outcome: Progressing Goal: Will remain free from infection 12/12/2018 1652 by Arlina Robes, RN Outcome: Adequate for Discharge 12/12/2018 1137 by Arlina Robes, RN Outcome: Progressing 12/12/2018 1137 by Arlina Robes, RN Outcome: Progressing Goal: Diagnostic test results will improve 12/12/2018 1652 by Arlina Robes, RN Outcome: Adequate for Discharge 12/12/2018 1137 by Arlina Robes, RN Outcome: Progressing 12/12/2018 1137 by Arlina Robes, RN Outcome: Progressing Goal: Respiratory complications will improve 12/12/2018 1652 by Arlina Robes, RN Outcome: Adequate for Discharge 12/12/2018 1137 by Arlina Robes, RN Outcome: Progressing 12/12/2018 1137 by Arlina Robes, RN Outcome: Progressing Goal: Cardiovascular complication will be avoided 12/12/2018 1652 by Arlina Robes, RN Outcome: Adequate for Discharge 12/12/2018 1137 by Arlina Robes, RN Outcome: Progressing 12/12/2018 1137 by Arlina Robes, RN Outcome: Progressing   Problem: Activity: Goal: Risk for activity intolerance will decrease 12/12/2018 1652 by Arlina Robes, RN Outcome: Adequate for Discharge 12/12/2018 1137 by Arlina Robes, RN Outcome: Progressing 12/12/2018 1137 by Arlina Robes, RN Outcome: Progressing   Problem: Coping: Goal: Level of anxiety will decrease 12/12/2018 1652 by Arlina Robes, RN Outcome: Adequate for Discharge 12/12/2018 1137 by Arlina Robes, RN Outcome: Progressing 12/12/2018 1137 by Arlina Robes, RN Outcome: Progressing   Problem: Elimination: Goal: Will not experience complications related to bowel motility 12/12/2018 1652 by Arlina Robes, RN Outcome: Adequate for Discharge 12/12/2018 1137 by Arlina Robes, RN Outcome: Progressing 12/12/2018 1137 by Arlina Robes, RN Outcome: Progressing   Problem: Pain Managment: Goal: General experience of comfort will improve 12/12/2018 1652 by Arlina Robes, RN Outcome: Adequate for Discharge 12/12/2018 1137 by Arlina Robes, RN Outcome: Progressing 12/12/2018 1137 by Arlina Robes, RN Outcome: Progressing   Problem: Safety: Goal: Ability to remain free from injury will improve 12/12/2018 1652 by Arlina Robes, RN Outcome: Adequate for Discharge 12/12/2018 1137 by Arlina Robes, RN Outcome: Progressing 12/12/2018 1137 by Arlina Robes, RN Outcome: Progressing

## 2018-12-12 NOTE — Progress Notes (Signed)
Physical Therapy Treatment Patient Details Name: Erika Conley MRN: [id number] DOB: Jun 19, 1934 Today's Date: 12/12/2018    History of Present Illness Pt is an 84 y/o female admitted following syncopal episode. PMH includes HTN, orthostatic hypotension, fibromyalgia, and vertigo.     PT Comments    Patient still with positive symptoms of R post canal BPPV.  Able to treat again with Eply maneuver.  Feel she will benefit from follow up [facility] for vestibular rehab.  Will follow up if not d/c.  Follow Up Recommendations  Home health PT;Other (comment)(at her ALF)     Equipment Recommendations  Wheelchair (measurements PT);Wheelchair cushion (measurements PT);Other (comment)(unsure if ALF might can provide w/c)    Recommendations for Other Services       Precautions / Restrictions Precautions Precautions: Fall Precaution Comments: dizziness with movement and h/o falls Restrictions Weight Bearing Restrictions: (P) No    Mobility  Bed Mobility Overal bed mobility: Needs Assistance Bed Mobility: (P) Supine to Sit     Supine to sit: (P) Min guard     General bed mobility comments: bed mobility only for DHP and Eply for R post canal BPPV  Transfers Overall transfer level: Needs assistance Equipment used: 1 person hand held assist Transfers: Sit to/from [facility] Sit to Stand: Min guard Stand pivot transfers: Min guard       General transfer comment: assisted from recliner to bed for vertigo tx and back to recliner  Ambulation/Gait                 Stairs             Wheelchair Mobility    Modified Rankin (Stroke Patients Only)       Balance Overall balance assessment: Needs assistance Sitting-balance support: (P) Feet supported Sitting balance-Leahy Scale: Good     Standing balance support: (P) Bilateral upper extremity supported;Single extremity supported;No upper extremity supported;During functional activity Standing  balance-Leahy Scale: Poor Standing balance comment: (P) able to perform grooming without UE support                            Cognition Arousal/Alertness: Awake/alert Behavior During Therapy: WFL for tasks assessed/performed Overall Cognitive Status: Within Functional Limits for tasks assessed                                        Exercises      General Comments General comments (skin integrity, edema, etc.): patient feels vertigo is better, but reports had episode when getting out of bed today.  Repeated R DHP off foot of bed and treated for R post canal BPPV with Eply manuever as positive for rotational nystagmus (still unable to see direction).      Pertinent Vitals/Pain Pain Assessment: (P) Faces Faces Pain Scale: Hurts little more Pain Location: R neck stiffness Pain Descriptors / Indicators: Aching Pain Intervention(s): Monitored during session;Heat applied    Home Living                      Prior Function            PT Goals (current goals can now be found in the care plan section) Progress towards PT goals: Progressing toward goals    Frequency           PT Plan Current plan remains  appropriate    Co-evaluation              AM-PAC PT "6 Clicks" Mobility   Outcome Measure  Help needed turning from your back to your side while in a flat bed without using bedrails?: A Little Help needed moving from lying on your back to sitting on the side of a flat bed without using bedrails?: A Little Help needed moving to and from a bed to a chair (including a wheelchair)?: A Little Help needed standing up from a chair using your arms (e.g., wheelchair or bedside chair)?: A Little Help needed to walk in hospital room?: A Little Help needed climbing 3-5 steps with a railing? : A Lot 6 Click Score: 17    End of Session   Activity Tolerance: Patient tolerated treatment well Patient left: in chair;with call bell/phone within  reach   PT Visit Diagnosis: Unsteadiness on feet (R26.81);Muscle weakness (generalized) (M62.81);Dizziness and giddiness (R42)     Time: [id number] PT Time Calculation (min) (ACUTE ONLY): 24 min  Charges:  $Therapeutic Activity: 8-22 mins $Canalith Rep Proc: 8-22 mins                     Magda Kiel, [place] [facility] [phone] 12/12/2018    Reginia Naas 12/12/2018, 3:22 PM

## 2018-12-12 NOTE — NC FL2 (Signed)
[place] LEVEL OF CARE SCREENING TOOL     IDENTIFICATION  Patient Name: Erika Conley Birthdate: [birth year] Sex: female Admission Date (Current Location): 12/07/2018  [facility] and [place] Number:  Herbalist and Address:  [facility] [place], [place] [address], [place], [place] [address]      Provider Number: [id number]  Attending Physician Name and Address:  Ezekiel Slocumb, DO  Relative Name and Phone Number:  Natalia Gallop [phone]    Current Level of Care: Hospital Recommended Level of Care: [place] Prior Approval Number:    Date Approved/Denied:   PASRR Number:    Discharge Plan: ([place])    Current Diagnoses: Patient Active Problem List   Diagnosis Date Noted  . Syncope and collapse 12/09/2018  . Syncope 12/08/2018  . Nausea and vomiting 12/14/2015  . Hyponatremia 12/13/2015  . Obstructive jaundice   . Bile duct stricture   . Pancreatitis 10/23/2015  . Abdominal pain 10/22/2015  . Pancreatic mass 10/22/2015  . Common bile duct (CBD) obstruction   . Abnormal LFTs   . Dehydration with hyponatremia 04/09/2015  . Acute respiratory failure with hypoxia ([place]) 04/09/2015  . Thrombocytopenia ([name]) 04/09/2015  . Hypokalemia 11/10/2014  . Hypertension 11/10/2014  . Hypothyroidism 11/10/2014  . Syncope due to orthostatic hypotension 11/10/2014  . Constipated   . UTI (urinary tract infection) 06/04/2013  . Altered mental status 06/04/2013    Orientation RESPIRATION BLADDER Height & Weight     Self, Time, Situation, Place  Normal External catheter, Continent Weight: 141 lb 1.6 oz (64 kg)(scale b) Height:  5' (152.4 cm)  BEHAVIORAL SYMPTOMS/MOOD NEUROLOGICAL BOWEL NUTRITION STATUS      Continent Diet(please see discharge summary)  AMBULATORY STATUS COMMUNICATION OF NEEDS Skin   Limited Assist Verbally (Ecchymosis arm,bilateral)                       Personal Care Assistance  Level of Assistance  Bathing, Feeding, Dressing Bathing Assistance: Limited assistance Feeding assistance: Independent Dressing Assistance: Limited assistance     Functional Limitations Info  Sight, Hearing, Speech Sight Info: Adequate Hearing Info: Adequate Speech Info: Adequate    SPECIAL CARE FACTORS FREQUENCY  PT by licensed PT     PT Frequency: [facility] [facility] 5x per week             Contractures Contractures Info: Not present    Additional Factors Info  Code Status, Allergies, Isolation Precautions, Insulin Sliding Scale, Psychotropic Code Status Info: DNR Allergies Info: Codeine,Cortisone,All narcotics cause patient to Pass out, fall Psychotropic Info: LORazepam (ATIVAN) tablet 0.5 mg, sertraline (ZOLOFT) tablet 100 mg Insulin Sliding Scale Info: enoxaparin (LOVENOX) injection 40 mg , Isolation Precautions Info: MRSA     Current Medications (12/12/2018):  This is the current hospital active medication list Current Facility-Administered Medications  Medication Dose Route Frequency Provider Last Rate Last Dose  . acetaminophen (TYLENOL) tablet 650 mg  650 mg Oral Q6H PRN Etta Quill, DO   650 mg at 12/12/18 1325   Or  . acetaminophen (TYLENOL) suppository 650 mg  650 mg Rectal Q6H PRN Etta Quill, DO      . artificial tears (LACRILUBE) ophthalmic ointment   Both Eyes QHS Swayze, Ava, DO      . busPIRone (BUSPAR) tablet 22.5 mg  22.5 mg Oral BID Jennette Kettle M, DO   22.5 mg at 12/12/18 0815  . calcium-vitamin D (OSCAL WITH  D) 500-200 MG-UNIT per tablet 1 tablet  1 tablet Oral Daily Etta Quill, DO   1 tablet at 12/12/18 0810  . dicyclomine (BENTYL) tablet 20 mg  20 mg Oral TID Etta Quill, DO   20 mg at 12/12/18 0816  . donepezil (ARICEPT) tablet 10 mg  10 mg Oral QHS Jennette Kettle M, DO   10 mg at 12/11/18 2118  . enoxaparin (LOVENOX) injection 40 mg  40 mg Subcutaneous Q24H Jennette Kettle M, DO   40 mg at 12/11/18 1534  . famotidine (PEPCID)  tablet 20 mg  20 mg Oral BID Jennette Kettle M, DO   20 mg at 12/12/18 [id number]  . fluticasone (FLONASE) 50 MCG/ACT nasal spray 1 spray  1 spray Each Nare BID Etta Quill, DO   1 spray at 12/12/18 0817  . gabapentin (NEURONTIN) capsule 300 mg  300 mg Oral BID Jennette Kettle M, DO   300 mg at 12/12/18 0813  . guaiFENesin (ROBITUSSIN) 100 MG/5ML solution 200 mg  200 mg Oral Q4H PRN Etta Quill, DO      . hydrocortisone 1 % ointment   Topical BID PRN Etta Quill, DO      . hydrOXYzine (ATARAX/VISTARIL) tablet 25 mg  25 mg Oral TID Etta Quill, DO   25 mg at 12/12/18 [id number]  . ketotifen (ZADITOR) 0.025 % ophthalmic solution 1 drop  1 drop Both Eyes BID PRN Etta Quill, DO      . levothyroxine (SYNTHROID) tablet 100 mcg  100 mcg Oral Q0600 Etta Quill, DO   100 mcg at 12/12/18 0547  . loperamide (IMODIUM) capsule 2 mg  2 mg Oral Daily PRN Etta Quill, DO   2 mg at 12/11/18 1014  . loratadine (CLARITIN) tablet 10 mg  10 mg Oral Daily Jennette Kettle M, DO   10 mg at 12/12/18 0813  . LORazepam (ATIVAN) tablet 0.5 mg  0.5 mg Oral QPM Jennette Kettle M, DO   0.5 mg at 12/11/18 1724  . meclizine (ANTIVERT) tablet 25 mg  25 mg Oral TID PRN Etta Quill, DO   25 mg at 12/11/18 1043  . multivitamin with minerals tablet 1 tablet  1 tablet Oral Daily Jennette Kettle M, DO   1 tablet at 12/12/18 [id number]  . naproxen (NAPROSYN) tablet 250 mg  250 mg Oral TID PRN Etta Quill, DO   250 mg at 12/12/18 0830  . ondansetron (ZOFRAN) tablet 4 mg  4 mg Oral Q6H PRN Etta Quill, DO   4 mg at 12/12/18 [id number]   Or  . ondansetron [facility]) injection 4 mg  4 mg Intravenous Q6H PRN Etta Quill, DO      . pantoprazole (PROTONIX) EC tablet 80 mg  80 mg Oral BID AC Jennette Kettle M, DO   80 mg at 12/12/18 [id number]  . polyvinyl alcohol (LIQUIFILM TEARS) 1.4 % ophthalmic solution 1 drop  1 drop Both Eyes PRN Etta Quill, DO      . sertraline (ZOLOFT) tablet 100 mg  100 mg Oral Daily Jennette Kettle M, DO   100 mg at 12/12/18 [id number]  . simethicone (MYLICON) chewable tablet 120 mg  120 mg Oral Q6H PRN Etta Quill, DO   120 mg at 12/11/18 1531  . sodium chloride flush (NS) 0.9 % injection 3 mL  3 mL Intravenous Q12H Jennette Kettle M, DO   3 mL at 12/12/18 0819  . traZODone (DESYREL)  tablet 200 mg  200 mg Oral QHS Jennette Kettle M, DO   200 mg at 12/11/18 2115     Discharge Medications: TAKE these medications   acetaminophen 325 MG tablet Commonly known as: TYLENOL Take 650 mg by mouth every 4 (four) hours as needed for mild pain. What changed: Another medication with the same name was removed. Continue taking this medication, and follow the directions you see here.   artificial tears Oint ophthalmic ointment Place 1 application into both eyes at bedtime.   busPIRone 15 MG tablet Commonly known as: BUSPAR Take 22.5 mg by mouth 2 (two) times daily.   calcium-vitamin D 500-200 MG-UNIT tablet Commonly known as: OSCAL WITH D Take 1 tablet by mouth daily.   carboxymethylcellulose 1 % ophthalmic solution Place 1 drop into both eyes as needed (for dry eyes).   CERTAVITE SENIOR/ANTIOXIDANT PO Take 1 tablet by mouth daily. What changed: Another medication with the same name was removed. Continue taking this medication, and follow the directions you see here.   cetirizine 10 MG tablet Commonly known as: ZYRTEC Take 10 mg by mouth daily.   dicyclomine 20 MG tablet Commonly known as: BENTYL Take 20 mg by mouth 3 (three) times daily.   donepezil 10 MG tablet Commonly known as: ARICEPT Take 10 mg by mouth at bedtime.   Epinastine HCl 0.05 % ophthalmic solution Place 1 drop into both eyes 2 (two) times daily as needed (for dry eye syndrome or allergic conjunctivitis).   famotidine 20 MG tablet Commonly known as: PEPCID Take 20 mg by mouth 2 (two) times daily.   fluticasone 50 MCG/ACT nasal spray Commonly known as: FLONASE Place 1 spray into both nostrils 2  (two) times daily.   gabapentin 300 MG capsule Commonly known as: NEURONTIN Take 300 mg by mouth 2 (two) times daily.   hydrocortisone 2.5 % ointment Apply topically 2 (two) times daily as needed (to area on face for rash).   hydrOXYzine 25 MG capsule Commonly known as: VISTARIL Take 25 mg by mouth 3 (three) times daily.   levothyroxine 100 MCG tablet Commonly known as: SYNTHROID Take 100 mcg by mouth daily before breakfast.   loperamide 2 MG capsule Commonly known as: IMODIUM Take 2 mg by mouth daily as needed for diarrhea or loose stools. After each loose stool   LORazepam 0.5 MG tablet Commonly known as: ATIVAN Take 0.5 mg by mouth every evening.   meclizine 25 MG tablet Commonly known as: ANTIVERT Take 25 mg by mouth 3 (three) times daily as needed for dizziness.   naproxen 250 MG tablet Commonly known as: NAPROSYN Take 250 mg by mouth 3 (three) times daily as needed (for osteoarthritis).   omeprazole 40 MG capsule Commonly known as: PRILOSEC Take 1 capsule (40 mg total) by mouth 2 (two) times daily. Take shortly before breakfast and dinner meal   ondansetron 8 MG tablet Commonly known as: ZOFRAN Take 8 mg by mouth every 8 (eight) hours as needed for nausea or vomiting.   Robafen Mucus/Chest Congestion 100 MG/5ML liquid Generic drug: guaiFENesin Take 200 mg by mouth every 4 (four) hours as needed for cough.   sertraline 100 MG tablet Commonly known as: ZOLOFT Take 100 mg by mouth daily.   simethicone 125 MG chewable tablet Commonly known as: MYLICON Chew 0000000 mg by mouth every 6 (six) hours as needed for flatulence.   traZODone 100 MG tablet Commonly known as: DESYREL Take 1.5 tablets (150 mg total) by mouth at bedtime.  What changed: how much to take     Relevant Imaging Results:  Relevant Lab Results:   Additional Information SSN [id number]  Vinie Sill, LCSWA

## 2018-12-12 NOTE — Discharge Summary (Signed)
Physician Discharge Summary  Erika Conley [id number] DOB: [birth year] DOA: 12/07/2018  PCP: Leonard Downing, MD  Admit date: 12/07/2018 Discharge date: 12/12/2018  Admitted From: [facility]  Disposition:  ALF - [facility]   Recommendations for Outpatient Follow-up:  1. Follow up with PCP in 1-2 weeks 2. Please obtain BMP/CBC in one week 3. Please consider outpatient vestibular rehab (for vertigo) at [facility].  Home Health: No Equipment/Devices: Wheelchair  Discharge Condition: Stable CODE STATUS: DNR  Diet recommendation: Heart Healthy  Brief/Interim Summary:  Erika Camp Ryanis a 83 y.o.femalewith medical history significant ofHTN, numerous prior syncopal episodes.  Patient presents to the ED after a syncopal episode. Had syncopal episode in May, and another episode x2 weeks ago. Happened again today. All 3 of these episodes have been when she was on the commode for BM, just finished using the bathroom and then she stands up, feels weak, sits back down, and passes out.After each episode EMS is called, and she is typically documented as being bradycardic and hypotensive on EMS arrival. She is documented as bradycardic and hypoxic (the latter oddly enough) today.  Did have episode of L-sided CP yesterday. Does have generalized abd pain, not new for her (has IBS with frequent diarrhea), also with history of prior episodes of gastritis and similar symptoms.  In the ED, patient had stable vitals and was asymptomatic.    The patient was admitted to a telemetry bed. Cardiology was consulted and feels that she is passing out due to vasovagal syncope. Orthostatic blood pressures are equivocal and her blood pressures are labile. Echocardiogram was obtained that demonstrates EF 60-65% with normal function and evidence for diastolic dysfunction. There was mildly elevated pulmonary artery systolic pressure. MI has been ruled out by enzyme and EKG  criteria. Cardiology does not recommend any changes to her blood pressure medications at this time. She has been evaluated by PT/OT. They have recommended home health PT and wheelchair with cushion.OT also worked with  the patient and found she dose have BPPV which was treated with Epley maneuver while admitted. They have alerted PT vestibular treatment team.  Recommend patient go to outpatient vestibular rehab if possible to continue therapy.  She does state meclizine helps.  Patient tested negative for COVID-19 on 12/11/18.  She is clinically improved and stable for discharge to her ALF today with home health PT.  Recommend fall precautions and use of compression stockings.    Discharge Diagnoses: Principal Problem:   Syncope Active Problems:   Hypertension   Hypothyroidism   Syncope and collapse    Discharge Instructions   Discharge Instructions    Call MD for:  persistant dizziness or light-headedness   Complete by: As directed    Call MD for:  temperature >100.4   Complete by: As directed    Diet - low sodium heart healthy   Complete by: As directed    Discharge instructions   Complete by: As directed    Please use caution and go slow when you get up to stand or walk.   Make sure you are not dizzy or lightheaded, and if you, sit or lay down immediately.    For your vertigo, would highly recommend going to [facility] for "[facility]ibular rehab".  This is a form of therapy to help with vertigo which is likely causing some of your dizziness.  Stay well-hydrated and use compression stocking.   Increase activity slowly   Complete by: As directed  Allergies as of 12/12/2018      Reactions   Codeine    Pass out, fall   Cortisone    Pass out, fall   Other    All narcotics cause patient to Pass out, fall      Medication List    TAKE these medications   acetaminophen 325 MG tablet Commonly known as: TYLENOL Take 650 mg by mouth every 4 (four) hours as needed for mild  pain. What changed: Another medication with the same name was removed. Continue taking this medication, and follow the directions you see here.   artificial tears Oint ophthalmic ointment Place 1 application into both eyes at bedtime.   busPIRone 15 MG tablet Commonly known as: BUSPAR Take 22.5 mg by mouth 2 (two) times daily.   calcium-vitamin D 500-200 MG-UNIT tablet Commonly known as: OSCAL WITH D Take 1 tablet by mouth daily.   carboxymethylcellulose 1 % ophthalmic solution Place 1 drop into both eyes as needed (for dry eyes).   CERTAVITE SENIOR/ANTIOXIDANT PO Take 1 tablet by mouth daily. What changed: Another medication with the same name was removed. Continue taking this medication, and follow the directions you see here.   cetirizine 10 MG tablet Commonly known as: ZYRTEC Take 10 mg by mouth daily.   dicyclomine 20 MG tablet Commonly known as: BENTYL Take 20 mg by mouth 3 (three) times daily.   donepezil 10 MG tablet Commonly known as: ARICEPT Take 10 mg by mouth at bedtime.   Epinastine HCl 0.05 % ophthalmic solution Place 1 drop into both eyes 2 (two) times daily as needed (for dry eye syndrome or allergic conjunctivitis).   famotidine 20 MG tablet Commonly known as: PEPCID Take 20 mg by mouth 2 (two) times daily.   fluticasone 50 MCG/ACT nasal spray Commonly known as: FLONASE Place 1 spray into both nostrils 2 (two) times daily.   gabapentin 300 MG capsule Commonly known as: NEURONTIN Take 300 mg by mouth 2 (two) times daily.   hydrocortisone 2.5 % ointment Apply topically 2 (two) times daily as needed (to area on face for rash).   hydrOXYzine 25 MG capsule Commonly known as: VISTARIL Take 25 mg by mouth 3 (three) times daily.   levothyroxine 100 MCG tablet Commonly known as: SYNTHROID Take 100 mcg by mouth daily before breakfast.   loperamide 2 MG capsule Commonly known as: IMODIUM Take 2 mg by mouth daily as needed for diarrhea or loose  stools. After each loose stool   LORazepam 0.5 MG tablet Commonly known as: ATIVAN Take 0.5 mg by mouth every evening.   meclizine 25 MG tablet Commonly known as: ANTIVERT Take 25 mg by mouth 3 (three) times daily as needed for dizziness.   naproxen 250 MG tablet Commonly known as: NAPROSYN Take 250 mg by mouth 3 (three) times daily as needed (for osteoarthritis).   omeprazole 40 MG capsule Commonly known as: PRILOSEC Take 1 capsule (40 mg total) by mouth 2 (two) times daily. Take shortly before breakfast and dinner meal   ondansetron 8 MG tablet Commonly known as: ZOFRAN Take 8 mg by mouth every 8 (eight) hours as needed for nausea or vomiting.   Robafen Mucus/Chest Congestion 100 MG/5ML liquid Generic drug: guaiFENesin Take 200 mg by mouth every 4 (four) hours as needed for cough.   sertraline 100 MG tablet Commonly known as: ZOLOFT Take 100 mg by mouth daily.   simethicone 125 MG chewable tablet Commonly known as: MYLICON Chew 0000000 mg by mouth  every 6 (six) hours as needed for flatulence.   traZODone 100 MG tablet Commonly known as: DESYREL Take 1.5 tablets (150 mg total) by mouth at bedtime. What changed: how much to take      Follow-up Information    Kroeger, Lorelee Cover., PA-C Follow up.   Specialty: Physician Assistant Why: You have a follow-up scheduled for 12/25/2018 at 2:30pm with Roby Lofts, one of Dr. Victorino December PAs. Please arrive 15 minutes early for check-in. Contact information: [address] [place] [address] [place] [address] [phone]          Allergies  Allergen Reactions  . Codeine     Pass out, fall  . Cortisone     Pass out, fall   . Other     All narcotics cause patient to Pass out, fall     Consultations:  Cardiology   Procedures/Studies: X-ray Chest Pa And Lateral  Result Date: 12/08/2018 CLINICAL DATA:  Initial evaluation for acute syncope. EXAM: CHEST - 2 VIEW COMPARISON:  Prior radiograph from 11/23/2018.  FINDINGS: Mild cardiomegaly, stable. Mediastinal silhouette within normal limits. Lungs hypoinflated. Streaky bibasilar atelectatic changes. No focal infiltrates. No edema or pleural effusion. No pneumothorax. No acute osseous finding. Multiple chronic compression deformities with sequelae of prior vertebral augmentation noted about the thoracolumbar junction. Remote posttraumatic deformity noted at the proximal left humerus. IMPRESSION: 1. Low lung volumes with associated bibasilar atelectatic changes. 2. No other active cardiopulmonary disease. Electronically Signed   By: Jeannine Boga M.D.   On: 12/08/2018 05:50   Ct Head Wo Contrast  Result Date: 11/23/2018 CLINICAL DATA:  Possible seizures EXAM: CT HEAD WITHOUT CONTRAST TECHNIQUE: Contiguous axial images were obtained from the base of the skull through the vertex without intravenous contrast. COMPARISON:  02/10/2015 FINDINGS: Brain: No evidence of acute infarction, hemorrhage, extra-axial collection, ventriculomegaly, or mass effect. Generalized cerebral atrophy. Periventricular white matter low attenuation likely secondary to microangiopathy. Vascular: Cerebrovascular atherosclerotic calcifications are noted. Skull: Negative for fracture or focal lesion. Sinuses/Orbits: Visualized portions of the orbits are unremarkable. Visualized portions of the paranasal sinuses and mastoid air cells are unremarkable. Other: None. IMPRESSION: No acute intracranial pathology. Electronically Signed   By: Kathreen Devoid   On: 11/23/2018 06:09   Dg Chest Port 1 View  Result Date: 11/23/2018 CLINICAL DATA:  Weakness EXAM: PORTABLE CHEST 1 VIEW COMPARISON:  07/07/2018 FINDINGS: Low lung volumes. No consolidation or effusion. Stable cardiomediastinal silhouette with aortic atherosclerosis. No pneumothorax. IMPRESSION: No active disease.  Low lung volumes. Electronically Signed   By: Donavan Foil M.D.   On: 11/23/2018 03:58      Echocardiogram 12/08/18 - EF  60-65%   Subjective: Patient reports feeling better this morning than yesterday.  Does have a headache this earlier morning.  Still with occasional nausea but no vomiting.  Had a BM earlier and did not feel dizzy or lightheaded.  Denies fever/chills, chest pain or SOB.  States feels ready to return to her ALF today and continue with PT there.   Discharge Exam: Vitals:   12/12/18 0414 12/12/18 1141  BP: (!) 125/48 (!) 145/56  Pulse: (!) 53 (!) 56  Resp: 18 18  Temp: 97.8 F (36.6 C) 98.2 F (36.8 C)  SpO2: 93% 96%   Vitals:   12/11/18 1141 12/11/18 1952 12/12/18 0414 12/12/18 1141  BP: 136/63 (!) 126/51 (!) 125/48 (!) 145/56  Pulse: (!) 49 66 (!) 53 (!) 56  Resp: 18 18 18 18   Temp: 98.6 F (37 C) 98.5  F (36.9 C) 97.8 F (36.6 C) 98.2 F (36.8 C)  TempSrc: Oral Oral Oral Oral  SpO2:  93% 93% 96%  Weight:   64 kg   Height:        General: Pt is alert, awake, not in acute distress Cardiovascular: RRR, S1/S2 +, no rubs, no gallops Respiratory: CTA bilaterally, no wheezing, no rhonchi Abdominal: Soft, NT, ND, bowel sounds + Extremities: b/l lower extremity nonpitting edema, no cyanosis    The results of significant diagnostics from this hospitalization (including imaging, microbiology, ancillary and laboratory) are listed below for reference.     Microbiology: Recent Results (from the past 240 hour(s))  SARS CORONAVIRUS 2 (TAT 6-24 HRS) Nasopharyngeal Nasopharyngeal Swab     Status: None   Collection Time: 12/08/18  5:39 AM   Specimen: Nasopharyngeal Swab  Result Value Ref Range Status   SARS Coronavirus 2 NEGATIVE NEGATIVE Final    Comment: (NOTE) SARS-CoV-2 target nucleic acids are NOT DETECTED. The SARS-CoV-2 RNA is generally detectable in upper and lower respiratory specimens during the acute phase of infection. Negative results do not preclude SARS-CoV-2 infection, do not rule out co-infections with other pathogens, and should not be used as the sole basis for  treatment or other patient management decisions. Negative results must be combined with clinical observations, patient history, and epidemiological information. The expected result is Negative. Fact Sheet for Patients: SugarRoll.be Fact Sheet for Healthcare Providers: https://www.woods-mathews.com/ This test is not yet approved or cleared by the [place] FDA and  has been authorized for detection and/or diagnosis of SARS-CoV-2 by FDA under an Emergency Use Authorization (EUA). This EUA will remain  in effect (meaning this test can be used) for the duration of the COVID-19 declaration under Section 56 4(b)(1) of the Act, 21 U.S.C. section 360bbb-3(b)(1), unless the authorization is terminated or revoked sooner. Performed at [facility], [place] [address] [place], [place] [address]   MRSA PCR Screening     Status: None   Collection Time: 12/08/18  7:44 PM   Specimen: Nasal Mucosa; Nasopharyngeal  Result Value Ref Range Status   MRSA by PCR NEGATIVE NEGATIVE Final    Comment:        The GeneXpert MRSA Assay (FDA approved for NASAL specimens only), is one component of a comprehensive MRSA colonization surveillance program. It is not intended to diagnose MRSA infection nor to guide or monitor treatment for MRSA infections. Performed at [facility], [place] [address], [place], [place] [address]   SARS CORONAVIRUS 2 (TAT 6-24 HRS) Nasopharyngeal Nasopharyngeal Swab     Status: None   Collection Time: 12/11/18  4:30 PM   Specimen: Nasopharyngeal Swab  Result Value Ref Range Status   SARS Coronavirus 2 NEGATIVE NEGATIVE Final    Comment: (NOTE) SARS-CoV-2 target nucleic acids are NOT DETECTED. The SARS-CoV-2 RNA is generally detectable in upper and lower respiratory specimens during the acute phase of infection. Negative results do not preclude SARS-CoV-2 infection, do not rule out co-infections with other pathogens, and  should not be used as the sole basis for treatment or other patient management decisions. Negative results must be combined with clinical observations, patient history, and epidemiological information. The expected result is Negative. Fact Sheet for Patients: SugarRoll.be Fact Sheet for Healthcare Providers: https://www.woods-mathews.com/ This test is not yet approved or cleared by the [place] FDA and  has been authorized for detection and/or diagnosis of SARS-CoV-2 by FDA under an Emergency Use Authorization (EUA). This EUA will  remain  in effect (meaning this test can be used) for the duration of the COVID-19 declaration under Section 56 4(b)(1) of the Act, 21 U.S.C. section 360bbb-3(b)(1), unless the authorization is terminated or revoked sooner. Performed at [facility], [place] [address] [place], [place] [address]      Labs: BNP (last 3 results) No results for input(s): BNP in the last 8760 hours. Basic Metabolic Panel: Recent Labs  Lab 12/08/18 0035  NA 136  K 3.9  CL 95*  CO2 29  GLUCOSE 124*  BUN 9  CREATININE 0.93  CALCIUM 8.8*  MG 1.8   Liver Function Tests: Recent Labs  Lab 12/08/18 0035  AST 18  ALT 11  ALKPHOS 46  BILITOT 0.4  PROT 5.5*  ALBUMIN 3.5   No results for input(s): LIPASE, AMYLASE in the last 168 hours. No results for input(s): AMMONIA in the last 168 hours. CBC: Recent Labs  Lab 12/08/18 0035  WBC 6.2  NEUTROABS 4.4  HGB 13.6  HCT 38.6  MCV 87.9  PLT 163   Cardiac Enzymes: No results for input(s): CKTOTAL, CKMB, CKMBINDEX, TROPONINI in the last 168 hours. BNP: Invalid input(s): POCBNP CBG: Recent Labs  Lab 12/08/18 0537 12/09/18 0554 12/10/18 0615 12/11/18 0613 12/12/18 0553  GLUCAP 101* 101* 99 94 98   D-Dimer No results for input(s): DDIMER in the last 72 hours. Hgb A1c No results for input(s): HGBA1C in the last 72 hours. Lipid Profile No results for  input(s): CHOL, HDL, LDLCALC, TRIG, CHOLHDL, LDLDIRECT in the last 72 hours. Thyroid function studies No results for input(s): TSH, T4TOTAL, T3FREE, THYROIDAB in the last 72 hours.  Invalid input(s): FREET3 Anemia work up No results for input(s): VITAMINB12, FOLATE, FERRITIN, TIBC, IRON, RETICCTPCT in the last 72 hours. Urinalysis    Component Value Date/Time   COLORURINE YELLOW 11/23/2018 0451   APPEARANCEUR HAZY (A) 11/23/2018 0451   LABSPEC 1.018 11/23/2018 0451   PHURINE 6.0 11/23/2018 0451   GLUCOSEU NEGATIVE 11/23/2018 0451   HGBUR NEGATIVE 11/23/2018 0451   BILIRUBINUR NEGATIVE 11/23/2018 0451   KETONESUR NEGATIVE 11/23/2018 0451   PROTEINUR 30 (A) 11/23/2018 0451   UROBILINOGEN 0.2 11/21/2014 0501   NITRITE NEGATIVE 11/23/2018 0451   LEUKOCYTESUR SMALL (A) 11/23/2018 0451   Sepsis Labs Invalid input(s): PROCALCITONIN,  WBC,  LACTICIDVEN Microbiology Recent Results (from the past 240 hour(s))  SARS CORONAVIRUS 2 (TAT 6-24 HRS) Nasopharyngeal Nasopharyngeal Swab     Status: None   Collection Time: 12/08/18  5:39 AM   Specimen: Nasopharyngeal Swab  Result Value Ref Range Status   SARS Coronavirus 2 NEGATIVE NEGATIVE Final    Comment: (NOTE) SARS-CoV-2 target nucleic acids are NOT DETECTED. The SARS-CoV-2 RNA is generally detectable in upper and lower respiratory specimens during the acute phase of infection. Negative results do not preclude SARS-CoV-2 infection, do not rule out co-infections with other pathogens, and should not be used as the sole basis for treatment or other patient management decisions. Negative results must be combined with clinical observations, patient history, and epidemiological information. The expected result is Negative. Fact Sheet for Patients: SugarRoll.be Fact Sheet for Healthcare Providers: https://www.woods-mathews.com/ This test is not yet approved or cleared by the [place] FDA and  has  been authorized for detection and/or diagnosis of SARS-CoV-2 by FDA under an Emergency Use Authorization (EUA). This EUA will remain  in effect (meaning this test can be used) for the duration of the COVID-19 declaration under Section 56 4(b)(1) of the Act,  21 U.S.C. section 360bbb-3(b)(1), unless the authorization is terminated or revoked sooner. Performed at [facility], [place] [address] [place], [place] [address]   MRSA PCR Screening     Status: None   Collection Time: 12/08/18  7:44 PM   Specimen: Nasal Mucosa; Nasopharyngeal  Result Value Ref Range Status   MRSA by PCR NEGATIVE NEGATIVE Final    Comment:        The GeneXpert MRSA Assay (FDA approved for NASAL specimens only), is one component of a comprehensive MRSA colonization surveillance program. It is not intended to diagnose MRSA infection nor to guide or monitor treatment for MRSA infections. Performed at [facility], [place] [address] [place], [place] [address]   SARS CORONAVIRUS 2 (TAT 6-24 HRS) Nasopharyngeal Nasopharyngeal Swab     Status: None   Collection Time: 12/11/18  4:30 PM   Specimen: Nasopharyngeal Swab  Result Value Ref Range Status   SARS Coronavirus 2 NEGATIVE NEGATIVE Final    Comment: (NOTE) SARS-CoV-2 target nucleic acids are NOT DETECTED. The SARS-CoV-2 RNA is generally detectable in upper and lower respiratory specimens during the acute phase of infection. Negative results do not preclude SARS-CoV-2 infection, do not rule out co-infections with other pathogens, and should not be used as the sole basis for treatment or other patient management decisions. Negative results must be combined with clinical observations, patient history, and epidemiological information. The expected result is Negative. Fact Sheet for Patients: SugarRoll.be Fact Sheet for Healthcare Providers: https://www.woods-mathews.com/ This test is not yet approved or  cleared by the [place] FDA and  has been authorized for detection and/or diagnosis of SARS-CoV-2 by FDA under an Emergency Use Authorization (EUA). This EUA will remain  in effect (meaning this test can be used) for the duration of the COVID-19 declaration under Section 56 4(b)(1) of the Act, 21 U.S.C. section 360bbb-3(b)(1), unless the authorization is terminated or revoked sooner. Performed at [facility], [place] [address] [place], [place] [address]      Time coordinating discharge: Over 30 minutes  SIGNED:   Ezekiel Slocumb, DO [facility] 12/12/2018, 12:23 PM Pager [phone]  If 7PM-7AM, please contact night-coverage [email or web address] Password [email or web address]

## 2018-12-12 NOTE — Plan of Care (Signed)
  Problem: Education: Goal: Knowledge of General Education information will improve Description: Including pain rating scale, medication(s)/side effects and non-pharmacologic comfort measures 12/12/2018 1137 by Arlina Robes, RN Outcome: Progressing 12/12/2018 1137 by Arlina Robes, RN Outcome: Progressing   Problem: Health Behavior/Discharge Planning: Goal: Ability to manage health-related needs will improve 12/12/2018 1137 by Arlina Robes, RN Outcome: Progressing 12/12/2018 1137 by Arlina Robes, RN Outcome: Progressing   Problem: Clinical Measurements: Goal: Ability to maintain clinical measurements within normal limits will improve 12/12/2018 1137 by Arlina Robes, RN Outcome: Progressing 12/12/2018 1137 by Arlina Robes, RN Outcome: Progressing Goal: Will remain free from infection 12/12/2018 1137 by Arlina Robes, RN Outcome: Progressing 12/12/2018 1137 by Arlina Robes, RN Outcome: Progressing Goal: Diagnostic test results will improve 12/12/2018 1137 by Arlina Robes, RN Outcome: Progressing 12/12/2018 1137 by Arlina Robes, RN Outcome: Progressing Goal: Respiratory complications will improve 12/12/2018 1137 by Arlina Robes, RN Outcome: Progressing 12/12/2018 1137 by Arlina Robes, RN Outcome: Progressing Goal: Cardiovascular complication will be avoided 12/12/2018 1137 by Arlina Robes, RN Outcome: Progressing 12/12/2018 1137 by Arlina Robes, RN Outcome: Progressing   Problem: Activity: Goal: Risk for activity intolerance will decrease 12/12/2018 1137 by Arlina Robes, RN Outcome: Progressing 12/12/2018 1137 by Arlina Robes, RN Outcome: Progressing   Problem: Coping: Goal: Level of anxiety will decrease 12/12/2018 1137 by Arlina Robes, RN Outcome: Progressing 12/12/2018 1137 by Arlina Robes, RN Outcome: Progressing   Problem: Elimination: Goal: Will not  experience complications related to bowel motility 12/12/2018 1137 by Arlina Robes, RN Outcome: Progressing 12/12/2018 1137 by Arlina Robes, RN Outcome: Progressing   Problem: Pain Managment: Goal: General experience of comfort will improve 12/12/2018 1137 by Arlina Robes, RN Outcome: Progressing 12/12/2018 1137 by Arlina Robes, RN Outcome: Progressing   Problem: Safety: Goal: Ability to remain free from injury will improve 12/12/2018 1137 by Arlina Robes, RN Outcome: Progressing 12/12/2018 1137 by Arlina Robes, RN Outcome: Progressing

## 2018-12-12 NOTE — Progress Notes (Signed)
Report called to [facility] [place]  Talked with Wells Guiles care coordinator at [phone] she verbalized understanding of report given She is to transfer via [facility]. Patient agreeable with transfer.

## 2018-12-12 NOTE — Progress Notes (Signed)
Patient to be discharged to SNF today case manager to alert writer which facility and discharge time await update via case manager.

## 2018-12-12 NOTE — Progress Notes (Signed)
Occupational Therapy Treatment Patient Details Name: Erika Conley MRN: [id number] DOB: [birth year] Today's Date: 12/12/2018    History of present illness Pt is an 84 y/o female admitted following syncopal episode. PMH includes HTN, orthostatic hypotension, fibromyalgia, and vertigo.    OT comments  Pt progressing towards OT goals this session. Able to complete transfers at min guard/supervision level with RW. No dizziness with transitions/transfers and able to complete sink level grooming and ambulation to chair with RW. LB dressing completed with set up at EOB. Current POC remains appropriate.    Follow Up Recommendations  Home health OT(return to ALF with HHOT)    Equipment Recommendations  3 in 1 bedside commode    Recommendations for Other Services      Precautions / Restrictions Precautions Precautions: Fall Precaution Comments: dizziness with movement and h/o falls Restrictions Weight Bearing Restrictions: No       Mobility Bed Mobility Overal bed mobility: Needs Assistance Bed Mobility: Supine to Sit     Supine to sit: Min guard     General bed mobility comments: bed mobility only for DHP and Eply for R post canal BPPV  Transfers Overall transfer level: Needs assistance Equipment used: 1 person hand held assist Transfers: Sit to/from [facility] Sit to Stand: Min guard Stand pivot transfers: Min guard       General transfer comment: assisted from recliner to bed for vertigo tx and back to recliner    Balance Overall balance assessment: Needs assistance Sitting-balance support: Feet supported Sitting balance-Leahy Scale: Good     Standing balance support: Bilateral upper extremity supported;Single extremity supported;No upper extremity supported;During functional activity Standing balance-Leahy Scale: Poor Standing balance comment: able to perform grooming without UE support                           ADL either performed  or assessed with clinical judgement   ADL Overall ADL's : Needs assistance/impaired     Grooming: Wash/dry hands;Wash/dry face;Oral care;Min guard;Standing Grooming Details (indicate cue type and reason): sink level, no dizziness today             Lower Body Dressing: Supervision/safety;Sit to/from stand Lower Body Dressing Details (indicate cue type and reason): able to don socks EOB without assist Toilet Transfer: Min guard;RW;Ambulation           Functional mobility during ADLs: Supervision/safety;Rolling walker General ADL Comments: overall min guard- supervision for safety considering sudden onset of dizziness that compromises safety in BADL     Vision       Perception     Praxis      Cognition Arousal/Alertness: Awake/alert Behavior During Therapy: WFL for tasks assessed/performed Overall Cognitive Status: Within Functional Limits for tasks assessed                                          Exercises     Shoulder Instructions       General Comments patient feels vertigo is better, but reports had episode when getting out of bed today.  Repeated R DHP off foot of bed and treated for R post canal BPPV with Eply manuever as positive for rotational nystagmus (still unable to see direction).    Pertinent Vitals/ Pain       Pain Assessment: Faces Faces Pain Scale: Hurts little more Pain Location: R neck  stiffness Pain Descriptors / Indicators: Aching Pain Intervention(s): Monitored during session;Heat applied  Home Living                                          Prior Functioning/Environment              Frequency  Min 2X/week        Progress Toward Goals  OT Goals(current goals can now be found in the care plan section)  Progress towards OT goals: Progressing toward goals  Acute Rehab OT Goals Patient Stated Goal: to feel better OT Goal Formulation: With patient Time For Goal Achievement:  12/24/18 Potential to Achieve Goals: Good  Plan Discharge plan remains appropriate;Frequency remains appropriate    Co-evaluation                 AM-PAC OT "6 Clicks" Daily Activity     Outcome Measure   Help from another person eating meals?: A Little Help from another person taking care of personal grooming?: A Little Help from another person toileting, which includes using toliet, bedpan, or urinal?: A Little Help from another person bathing (including washing, rinsing, drying)?: A Little Help from another person to put on and taking off regular upper body clothing?: A Little Help from another person to put on and taking off regular lower body clothing?: A Little 6 Click Score: 18    End of Session Equipment Utilized During Treatment: Gait belt;Rolling walker  OT Visit Diagnosis: Unsteadiness on feet (R26.81);BPPV;Other abnormalities of gait and mobility (R26.89);Pain Pain - part of body: (right neck)   Activity Tolerance Patient tolerated treatment well   Patient Left in chair   Nurse Communication          Time: [id number] OT Time Calculation (min): 33 min  Charges: OT General Charges $OT Visit: 1 Visit OT Treatments $Self Care/Home Management : 23-37 mins  Hulda Humphrey OTR/L Acute Rehabilitation Services Pager: [phone] Office: [place] 12/12/2018, 3:48 PM

## 2018-12-12 NOTE — Progress Notes (Signed)
Patient discharged to [place] back to her previous facility left with [facility].

## 2018-12-24 NOTE — Progress Notes (Deleted)
Cardiology Office Note   Date:  12/24/2018   ID:  [name], DOB [birth year], MRN [id number]  PCP:  Leonard Downing, MD  Cardiologist:  Sanda Klein, MD EP: None  No chief complaint on file.     History of Present Illness: Erika Conley is a 84 y.o. female with a PMH of HTN and dementia with recent admission for syncope, who presents for post-hospital follow-up.   She was admitted to the hospital from *** . Cardiology evaluated for syncope which was felt to be vasovagal in nature. She had positive orthostatic vitals and was recommended to maintain adequate hydration, increase salt intake, and wear compression stockings. Echo that admission showed EF 60-65%, G1DD, and no significant valvular abnormalities.   She presents today for post-hospital follow-up.   1. Recurrent syncope: most recent syncope felt to be vasovagal mediated. No syncopal episodes since discharge.  - Continue to stay hydrated, increase salt intake, and wear compression stockings.  2. HTN: antihypertensives deferred given history of orthostatic hypotension.     Past Medical History:  Diagnosis Date  . Anxiety   . Arthritis   . Depression   . Falls frequently   . Fibromyalgia   . Fracture, clavicle   . GERD (gastroesophageal reflux disease)   . History of fractured vertebra   . Hypertension   . Hypothyroidism   . Seasonal allergies   . Thyroid disease   . Vertigo   . Vitamin A deficiency   . Wrist fracture, right     Past Surgical History:  Procedure Laterality Date  . ABDOMINAL HYSTERECTOMY    . BACK SURGERY    . CHOLECYSTECTOMY    . ERCP N/A 10/22/2015   Procedure: ENDOSCOPIC RETROGRADE CHOLANGIOPANCREATOGRAPHY (ERCP);  Surgeon: Milus Banister, MD;  Location: [facility];  Service: Endoscopy;  Laterality: N/A;  . ERCP N/A 11/25/2015   Procedure: ENDOSCOPIC RETROGRADE CHOLANGIOPANCREATOGRAPHY (ERCP);  Surgeon: Milus Banister, MD;  Location: [facility];  Service: Endoscopy;   Laterality: N/A;  . EUS N/A 11/25/2015   Procedure: ESOPHAGEAL ENDOSCOPIC ULTRASOUND (EUS) RADIAL;  Surgeon: Milus Banister, MD;  Location: [facility];  Service: Endoscopy;  Laterality: N/A;  . FRACTURE SURGERY    . THYROIDECTOMY    . WRIST SURGERY       Current Outpatient Medications  Medication Sig Dispense Refill  . acetaminophen (TYLENOL) 325 MG tablet Take 650 mg by mouth every 4 (four) hours as needed for mild pain.    . busPIRone (BUSPAR) 15 MG tablet Take 22.5 mg by mouth 2 (two) times daily.    . calcium-vitamin D (OSCAL WITH D) 500-200 MG-UNIT tablet Take 1 tablet by mouth daily.    . carboxymethylcellulose 1 % ophthalmic solution Place 1 drop into both eyes as needed (for dry eyes).    . cetirizine (ZYRTEC) 10 MG tablet Take 10 mg by mouth daily.    Marland Kitchen dicyclomine (BENTYL) 20 MG tablet Take 20 mg by mouth 3 (three) times daily.    Marland Kitchen donepezil (ARICEPT) 10 MG tablet Take 10 mg by mouth at bedtime.    Marland Kitchen Epinastine HCl 0.05 % ophthalmic solution Place 1 drop into both eyes 2 (two) times daily as needed (for dry eye syndrome or allergic conjunctivitis).    . famotidine (PEPCID) 20 MG tablet Take 20 mg by mouth 2 (two) times daily.    . fluticasone (FLONASE) 50 MCG/ACT nasal spray Place 1 spray into both nostrils 2 (two) times daily.     Marland Kitchen  gabapentin (NEURONTIN) 300 MG capsule Take 300 mg by mouth 2 (two) times daily.    Marland Kitchen guaiFENesin (ROBAFEN MUCUS/CHEST CONGESTION) 100 MG/5ML liquid Take 200 mg by mouth every 4 (four) hours as needed for cough.    . hydrocortisone 2.5 % ointment Apply topically 2 (two) times daily as needed (to area on face for rash).    . hydrOXYzine (VISTARIL) 25 MG capsule Take 25 mg by mouth 3 (three) times daily.     Marland Kitchen levothyroxine (SYNTHROID) 100 MCG tablet Take 100 mcg by mouth daily before breakfast.    . loperamide (IMODIUM) 2 MG capsule Take 2 mg by mouth daily as needed for diarrhea or loose stools. After each loose stool    . LORazepam (ATIVAN) 0.5 MG  tablet Take 0.5 mg by mouth every evening.     . meclizine (ANTIVERT) 25 MG tablet Take 25 mg by mouth 3 (three) times daily as needed for dizziness.    . Multiple Vitamins-Minerals (CERTAVITE SENIOR/ANTIOXIDANT PO) Take 1 tablet by mouth daily.    . naproxen (NAPROSYN) 250 MG tablet Take 250 mg by mouth 3 (three) times daily as needed (for osteoarthritis).    Marland Kitchen omeprazole (PRILOSEC) 40 MG capsule Take 1 capsule (40 mg total) by mouth 2 (two) times daily. Take shortly before breakfast and dinner meal 60 capsule 5  . ondansetron (ZOFRAN) 8 MG tablet Take 8 mg by mouth every 8 (eight) hours as needed for nausea or vomiting.    . sertraline (ZOLOFT) 100 MG tablet Take 100 mg by mouth daily.    . simethicone (MYLICON) 0000000 MG chewable tablet Chew 125 mg by mouth every 6 (six) hours as needed for flatulence.    . traZODone (DESYREL) 100 MG tablet Take 1.5 tablets (150 mg total) by mouth at bedtime. 30 tablet 1  . White Petrolatum-Mineral Oil (ARTIFICIAL TEARS) OINT ophthalmic ointment Place 1 application into both eyes at bedtime.     No current facility-administered medications for this visit.     Allergies:   Codeine, Cortisone, and Other    Social History:  The patient  reports that she has never smoked. She has never used smokeless tobacco. She reports that she does not drink alcohol or use drugs.   Family History:  The patient's ***family history includes Hypertension in an other family member.    ROS:  Please see the history of present illness.   Otherwise, review of systems are positive for {NONE DEFAULTED:18576::"none"}.   All other systems are reviewed and negative.    PHYSICAL EXAM: VS:  There were no vitals taken for this visit. , BMI There is no height or weight on file to calculate BMI. GEN: Well nourished, well developed, in no acute distress HEENT: normal Neck: no JVD, carotid bruits, or masses Cardiac: ***RRR; no murmurs, rubs, or gallops,no edema  Respiratory:  clear to  auscultation bilaterally, normal work of breathing GI: soft, nontender, nondistended, + BS MS: no deformity or atrophy Skin: warm and dry, no rash Neuro:  Strength and sensation are intact Psych: euthymic mood, full affect   EKG:  EKG {ACTION; IS/IS [id number] ordered today. The ekg ordered today demonstrates ***   Recent Labs: 12/08/2018: ALT 11; BUN 9; Creatinine, Ser 0.93; Hemoglobin 13.6; Magnesium 1.8; Platelets 163; Potassium 3.9; Sodium 136; TSH 0.946    Lipid Panel No results found for: CHOL, TRIG, HDL, CHOLHDL, VLDL, LDLCALC, LDLDIRECT    Wt Readings from Last 3 Encounters:  12/12/18 141 lb 1.6 oz (64 kg)  09/20/18 143 lb (64.9 kg)  07/16/18 140 lb (63.5 kg)      Other studies Reviewed: Additional studies/ records that were reviewed today include:   Echocardiogram 12/08/2018: IMPRESSIONS    1. Left ventricular ejection fraction, by visual estimation, is 60 to 65%. The left ventricle has normal function. Normal left ventricular size. There is no left ventricular hypertrophy.  2. Left ventricular diastolic Doppler parameters are consistent with impaired relaxation pattern of LV diastolic filling.  3. Global right ventricle has normal systolic function.The right ventricular size is normal. No increase in right ventricular wall thickness.  4. Left atrial size was mildly dilated.  5. Right atrial size was normal.  6. The mitral valve is normal in structure. No evidence of mitral valve regurgitation. No evidence of mitral stenosis.  7. The tricuspid valve is normal in structure. Tricuspid valve regurgitation was not visualized by color flow Doppler.  8. The aortic valve is normal in structure. Aortic valve regurgitation was not visualized by color flow Doppler. Structurally normal aortic valve, with no evidence of sclerosis or stenosis.  9. The pulmonic valve was normal in structure. Pulmonic valve regurgitation is not visualized by color flow Doppler. 10. Mildly  elevated pulmonary artery systolic pressure. 11. The inferior vena cava is normal in size with greater than 50% respiratory variability, suggesting right atrial pressure of 3 mmHg.   ASSESSMENT AND PLAN:  1.  ***   Current medicines are reviewed at length with the patient today.  The patient {ACTIONS; HAS/DOES NOT HAVE:19233} concerns regarding medicines.  The following changes have been made:  {PLAN; NO CHANGE:13088:s}  Labs/ tests ordered today include: *** No orders of the defined types were placed in this encounter.    Disposition:   FU with *** in {gen number [id number] {Days to years:10300}  Signed, Abigail Butts, PA-C  12/24/2018 11:27 PM

## 2018-12-25 ENCOUNTER — Ambulatory Visit: Payer: Medicare Other | Admitting: Medical

## 2019-01-04 ENCOUNTER — Encounter (HOSPITAL_COMMUNITY): Payer: Self-pay | Admitting: Emergency Medicine

## 2019-01-04 ENCOUNTER — Emergency Department (HOSPITAL_COMMUNITY)
Admission: EM | Admit: 2019-01-04 | Discharge: 2019-01-04 | Disposition: A | Discharge status: Home / Self Care | Payer: Medicare Other | Attending: Emergency Medicine | Admitting: Emergency Medicine

## 2019-01-04 ENCOUNTER — Emergency Department (HOSPITAL_COMMUNITY): Payer: Medicare Other

## 2019-01-04 ENCOUNTER — Other Ambulatory Visit: Payer: Self-pay

## 2019-01-04 DIAGNOSIS — R55 Syncope and collapse: Secondary | ICD-10-CM | POA: Insufficient documentation

## 2019-01-04 DIAGNOSIS — I10 Essential (primary) hypertension: Secondary | ICD-10-CM | POA: Diagnosis not present

## 2019-01-04 DIAGNOSIS — R110 Nausea: Secondary | ICD-10-CM | POA: Insufficient documentation

## 2019-01-04 DIAGNOSIS — E039 Hypothyroidism, unspecified: Secondary | ICD-10-CM | POA: Diagnosis not present

## 2019-01-04 DIAGNOSIS — Z79899 Other long term (current) drug therapy: Secondary | ICD-10-CM | POA: Insufficient documentation

## 2019-01-04 LAB — CBC WITH DIFFERENTIAL/PLATELET
Abs Immature Granulocytes: 0.01 10*3/uL (ref 0.00–0.07)
Basophils Absolute: 0 10*3/uL (ref 0.0–0.1)
Basophils Relative: 1 %
Eosinophils Absolute: 0.1 10*3/uL (ref 0.0–0.5)
Eosinophils Relative: 2 %
HCT: 40 % (ref 36.0–46.0)
Hemoglobin: 13.8 g/dL (ref 12.0–15.0)
Immature Granulocytes: 0 %
Lymphocytes Relative: 31 %
Lymphs Abs: 1.4 10*3/uL (ref 0.7–4.0)
MCH: 30.7 pg (ref 26.0–34.0)
MCHC: 34.5 g/dL (ref 30.0–36.0)
MCV: 89.1 fL (ref 80.0–100.0)
Monocytes Absolute: 0.5 10*3/uL (ref 0.1–1.0)
Monocytes Relative: 10 %
Neutro Abs: 2.5 10*3/uL (ref 1.7–7.7)
Neutrophils Relative %: 56 %
Platelets: 152 10*3/uL (ref 150–400)
RBC: 4.49 MIL/uL (ref 3.87–5.11)
RDW: 12.5 % (ref 11.5–15.5)
WBC: 4.5 10*3/uL (ref 4.0–10.5)
nRBC: 0 % (ref 0.0–0.2)

## 2019-01-04 LAB — COMPREHENSIVE METABOLIC PANEL
ALT: 12 U/L (ref 0–44)
AST: 16 U/L (ref 15–41)
Albumin: 3.5 g/dL (ref 3.5–5.0)
Alkaline Phosphatase: 39 U/L (ref 38–126)
Anion gap: 8 (ref 5–15)
BUN: 8 mg/dL (ref 8–23)
CO2: 27 mmol/L (ref 22–32)
Calcium: 8.5 mg/dL — ABNORMAL LOW (ref 8.9–10.3)
Chloride: 98 mmol/L (ref 98–111)
Creatinine, Ser: 0.98 mg/dL (ref 0.44–1.00)
GFR calc Af Amer: >60 mL/min (ref >60)
GFR calc non Af Amer: 54 mL/min — ABNORMAL LOW (ref >60)
Glucose, Bld: 94 mg/dL (ref 70–99)
Potassium: 3.8 mmol/L (ref 3.5–5.1)
Sodium: 133 mmol/L — ABNORMAL LOW (ref 135–145)
Total Bilirubin: 0.6 mg/dL (ref 0.3–1.2)
Total Protein: 5.3 g/dL — ABNORMAL LOW (ref 6.5–8.1)

## 2019-01-04 LAB — MAGNESIUM: Magnesium: 1.8 mg/dL (ref 1.7–2.4)

## 2019-01-04 LAB — TROPONIN I (HIGH SENSITIVITY)
Troponin I (High Sensitivity): <2 ng/L (ref <18)
Troponin I (High Sensitivity): 2 ng/L (ref <18)

## 2019-01-04 MED ORDER — ONDANSETRON HCL 4 MG/2ML IJ SOLN
4.0000 mg | Freq: Once | INTRAMUSCULAR | Status: AC
  Administered 2019-01-04: 13:00:00 4 mg via INTRAVENOUS
  Filled 2019-01-04: qty 2

## 2019-01-04 MED ORDER — ACETAMINOPHEN 500 MG PO TABS
500.0000 mg | ORAL_TABLET | Freq: Once | ORAL | Status: AC
  Administered 2019-01-04: 500 mg via ORAL
  Filled 2019-01-04: qty 1

## 2019-01-04 MED ORDER — SODIUM CHLORIDE 0.9 % IV BOLUS
1000.0000 mL | Freq: Once | INTRAVENOUS | Status: AC
  Administered 2019-01-04: 1000 mL via INTRAVENOUS

## 2019-01-04 NOTE — ED Triage Notes (Signed)
Pt BIB [facility] from [place]. Pt was sitting down taking a shower this morning when she became lightheaded and nauseated. Initial BP with EMS was 72/48 manual. HR in 50s.

## 2019-01-04 NOTE — ED Notes (Signed)
Pt discharged via [facility]. Discharge paperwork reviewed with patient. Pt verbalized understanding. Pt discharged.

## 2019-01-04 NOTE — Discharge Instructions (Signed)
Please call your primary doctor to get an appointment to be seen on Monday or Tuesday of this coming week.  Please return to ER if you have any further episodes of passing out, any falls, chest pain or difficulty breathing.  Please stay hydrated as discussed.

## 2019-01-04 NOTE — ED Notes (Signed)
Called [facility] for pt transport  

## 2019-01-04 NOTE — ED Provider Notes (Signed)
[facility] EMERGENCY DEPARTMENT Provider Note   CSN: [id number] Arrival date & time: 01/04/19  [id number]     History   Chief Complaint No chief complaint on file.   HPI Erika Conley is a 84 y.o. female.  Presents emerged department after syncopal episode.  Patient reports this morning she felt lightheaded and somewhat nauseous, had a syncopal episode, eased down slowly to the ground as she felt the symptoms coming on.  Denies any head trauma.  No vomiting.  Currently, she states she has a mild dull headache.  Frontal, not worse headache of her life.  States she frequently has these headaches after syncopal episodes.  Mildly nauseous.  No ongoing lightheadedness, no dizziness.  States that she otherwise has been feeling well, no systemic symptoms.  Chart review, recent admission in October for syncope.  Cardiology consulted, patient had no events on telemetry monitoring, grossly normal echocardiogram.  Per hospitalist and cardiology note, most likely vasovagal syncope.     HPI  Past Medical History:  Diagnosis Date  . Anxiety   . Arthritis   . Depression   . Falls frequently   . Fibromyalgia   . Fracture, clavicle   . GERD (gastroesophageal reflux disease)   . History of fractured vertebra   . Hypertension   . Hypothyroidism   . Seasonal allergies   . Thyroid disease   . Vertigo   . Vitamin A deficiency   . Wrist fracture, right     Patient Active Problem List   Diagnosis Date Noted  . Syncope and collapse 12/09/2018  . Syncope 12/08/2018  . Nausea and vomiting 12/14/2015  . Hyponatremia 12/13/2015  . Obstructive jaundice   . Bile duct stricture   . Pancreatitis 10/23/2015  . Abdominal pain 10/22/2015  . Pancreatic mass 10/22/2015  . Common bile duct (CBD) obstruction   . Abnormal LFTs   . Dehydration with hyponatremia 04/09/2015  . Acute respiratory failure with hypoxia ([place]) 04/09/2015  . Thrombocytopenia ([place]) 04/09/2015  . Hypokalemia  11/10/2014  . Hypertension 11/10/2014  . Hypothyroidism 11/10/2014  . Syncope due to orthostatic hypotension 11/10/2014  . Constipated   . UTI (urinary tract infection) 06/04/2013  . Altered mental status 06/04/2013    Past Surgical History:  Procedure Laterality Date  . ABDOMINAL HYSTERECTOMY    . BACK SURGERY    . CHOLECYSTECTOMY    . ERCP N/A 10/22/2015   Procedure: ENDOSCOPIC RETROGRADE CHOLANGIOPANCREATOGRAPHY (ERCP);  Surgeon: Milus Banister, MD;  Location: [facility];  Service: Endoscopy;  Laterality: N/A;  . ERCP N/A 11/25/2015   Procedure: ENDOSCOPIC RETROGRADE CHOLANGIOPANCREATOGRAPHY (ERCP);  Surgeon: Milus Banister, MD;  Location: [facility];  Service: Endoscopy;  Laterality: N/A;  . EUS N/A 11/25/2015   Procedure: ESOPHAGEAL ENDOSCOPIC ULTRASOUND (EUS) RADIAL;  Surgeon: Milus Banister, MD;  Location: [facility];  Service: Endoscopy;  Laterality: N/A;  . FRACTURE SURGERY    . THYROIDECTOMY    . WRIST SURGERY       OB History   No obstetric history on file.      Home Medications    Prior to Admission medications   Medication Sig Start Date End Date Taking? Authorizing Provider  acetaminophen (TYLENOL) 325 MG tablet Take 650 mg by mouth every 4 (four) hours as needed for mild pain.    [provider]  busPIRone (BUSPAR) 15 MG tablet Take 22.5 mg by mouth 2 (two) times daily.    [provider]  calcium-vitamin D Darron Doom  WITH D) 500-200 MG-UNIT tablet Take 1 tablet by mouth daily.    [provider]  carboxymethylcellulose 1 % ophthalmic solution Place 1 drop into both eyes as needed (for dry eyes).    [provider]  cetirizine (ZYRTEC) 10 MG tablet Take 10 mg by mouth daily.    [provider]  dicyclomine (BENTYL) 20 MG tablet Take 20 mg by mouth 3 (three) times daily.    [provider]  donepezil (ARICEPT) 10 MG tablet Take 10 mg by mouth at bedtime.    [provider]  Epinastine HCl 0.05 %  ophthalmic solution Place 1 drop into both eyes 2 (two) times daily as needed (for dry eye syndrome or allergic conjunctivitis).    [provider]  famotidine (PEPCID) 20 MG tablet Take 20 mg by mouth 2 (two) times daily.    [provider]  fluticasone (FLONASE) 50 MCG/ACT nasal spray Place 1 spray into both nostrils 2 (two) times daily.     [provider]  gabapentin (NEURONTIN) 300 MG capsule Take 300 mg by mouth 2 (two) times daily.    [provider]  guaiFENesin (ROBAFEN MUCUS/CHEST CONGESTION) 100 MG/5ML liquid Take 200 mg by mouth every 4 (four) hours as needed for cough.    [provider]  hydrocortisone 2.5 % ointment Apply topically 2 (two) times daily as needed (to area on face for rash).    [provider]  hydrOXYzine (VISTARIL) 25 MG capsule Take 25 mg by mouth 3 (three) times daily.     [provider]  levothyroxine (SYNTHROID) 100 MCG tablet Take 100 mcg by mouth daily before breakfast.    [provider]  loperamide (IMODIUM) 2 MG capsule Take 2 mg by mouth daily as needed for diarrhea or loose stools. After each loose stool    [provider]  LORazepam (ATIVAN) 0.5 MG tablet Take 0.5 mg by mouth every evening.     [provider]  meclizine (ANTIVERT) 25 MG tablet Take 25 mg by mouth 3 (three) times daily as needed for dizziness.    [provider]  Multiple Vitamins-Minerals (CERTAVITE SENIOR/ANTIOXIDANT PO) Take 1 tablet by mouth daily.    [provider]  naproxen (NAPROSYN) 250 MG tablet Take 250 mg by mouth 3 (three) times daily as needed (for osteoarthritis).    [provider]  omeprazole (PRILOSEC) 40 MG capsule Take 1 capsule (40 mg total) by mouth 2 (two) times daily. Take shortly before breakfast and dinner meal 07/16/18   Milus Banister, MD  ondansetron (ZOFRAN) 8 MG tablet Take 8 mg by mouth every 8 (eight) hours as needed for nausea or vomiting.     [provider]  sertraline (ZOLOFT) 100 MG tablet Take 100 mg by mouth daily.    [provider]  simethicone (MYLICON) 0000000 MG chewable tablet Chew 125 mg by mouth every 6 (six) hours as needed for flatulence.    [provider]  traZODone (DESYREL) 100 MG tablet Take 1.5 tablets (150 mg total) by mouth at bedtime. 12/12/18   Ezekiel Slocumb, DO  White Petrolatum-Mineral Oil (ARTIFICIAL TEARS) OINT ophthalmic ointment Place 1 application into both eyes at bedtime.    [provider]    Family History Family History  Problem Relation Age of Onset  . Hypertension Other     Social History Social History   Tobacco Use  . Smoking status: Never Smoker  . Smokeless tobacco: Never Used  Substance Use Topics  . Alcohol use: No  . Drug use: No     Allergies   Codeine, Cortisone, and Other   Review of Systems Review of Systems   Physical Exam Updated Vital Signs BP (!) 150/59   Pulse (!) 59   Temp 97.7 F (36.5 C) (Oral)   Resp 16   Ht 5' (1.524 m)   Wt 64 kg   SpO2 98%   BMI 27.54 kg/m   Physical Exam   ED Treatments / Results  Labs (all labs ordered are listed, but only abnormal results are displayed) Labs Reviewed  COMPREHENSIVE METABOLIC PANEL - Abnormal; Notable for the following components:      Result Value   Sodium 133 (*)    Calcium 8.5 (*)    Total Protein 5.3 (*)    GFR calc non Af Amer 54 (*)    All other components within normal limits  CBC WITH DIFFERENTIAL/PLATELET  MAGNESIUM  TROPONIN I (HIGH SENSITIVITY)  TROPONIN I (HIGH SENSITIVITY)    EKG EKG Interpretation  Date/Time:  Saturday January 04 2019 09:30:23 EST Ventricular Rate:  50 PR Interval:    QRS Duration: 96 QT Interval:  516 QTC Calculation: 471 R Axis:   -10 Text Interpretation: Junctional rhythm Low voltage, precordial leads Borderline T wave abnormalities Confirmed by Madalyn Rob [phone]) on 01/04/2019 11:31:53 AM   Radiology Dg  Chest 2 View  Result Date: 01/04/2019 CLINICAL DATA:  Syncope. EXAM: CHEST - 2 VIEW COMPARISON:  12/08/2018 FINDINGS: The heart size and mediastinal contours are within normal limits. Subsegmental atelectasis noted in the left lung base. The visualized skeletal structures are unremarkable. IMPRESSION: 1. No active cardiopulmonary abnormalities. 2. Left base subsegmental atelectasis. Electronically Signed   By: Kerby Moors M.D.   On: 01/04/2019 10:31    Procedures Procedures (including critical care time)  Medications Ordered in ED Medications  sodium chloride 0.9 % bolus 1,000 mL (0 mLs Intravenous Stopped 01/04/19 1245)  ondansetron (ZOFRAN) injection 4 mg (4 mg Intravenous Given 01/04/19 1251)  acetaminophen (TYLENOL) tablet 500 mg (500 mg Oral Given 01/04/19 1253)     Initial Impression / Assessment and Plan / ED Course  I have reviewed the triage vital signs and the nursing notes.  Pertinent labs & imaging results that were available during my care of the patient were reviewed by me and considered in my medical decision making (see chart for details).  Clinical Course as of Jan 03 1521  Sat Jan 04, 2019  1011 Completed initial assessment   [RD]  1141 Discussed with skains, feels most likely vasovagal, no further work up from cardiac stand point needed at this time   [RD]  1436 Recheck patient, symptoms resolved, no ongoing nausea or lightheadedness, requests discharge, will DC   [RD]    Clinical Course User Index [RD] Erika Starch, MD      84 year old lady presents to ER after syncopal episode.  Patient recent mission for near identical episodes.  Formal cardiology consultation, echocardiogram, per chart review they felt likely vasovagal in nature.  Today patient was noted to be bradycardic and hypotensive initially with EMS.  Here patient was normotensive, no further episodes of low BP.  I reviewed case with cardiology on-call, still feels most likely vasovagal in  nature.  EKG without ischemic changes, labs within normal limits.  No further episodes while in ER, ambulating without difficulty.  At this time believe she is appropriate for continued outpatient management.  Reviewed strict  return precautions and recommended close recheck with her PCP.    After the discussed management above, the patient was determined to be safe for discharge.  The patient was in agreement with this plan and all questions regarding their care were answered.  ED return precautions were discussed and the patient will return to the ED with any significant worsening of condition.   Final Clinical Impressions(s) / ED Diagnoses   Final diagnoses:  Vasovagal syncope    ED Discharge Orders    None       Erika Starch, MD 01/04/19 [phone]

## 2021-02-14 ENCOUNTER — Other Ambulatory Visit: Payer: Self-pay

## 2021-02-14 ENCOUNTER — Encounter (HOSPITAL_COMMUNITY): Payer: Self-pay

## 2021-02-14 ENCOUNTER — Emergency Department (HOSPITAL_COMMUNITY): Payer: Medicare Other

## 2021-02-14 ENCOUNTER — Emergency Department (HOSPITAL_COMMUNITY)
Admission: EM | Admit: 2021-02-14 | Discharge: 2021-02-14 | Disposition: A | Discharge status: Home / Self Care | Payer: Medicare Other | Attending: Emergency Medicine | Admitting: Emergency Medicine

## 2021-02-14 DIAGNOSIS — R0781 Pleurodynia: Secondary | ICD-10-CM | POA: Diagnosis present

## 2021-02-14 DIAGNOSIS — I10 Essential (primary) hypertension: Secondary | ICD-10-CM | POA: Diagnosis not present

## 2021-02-14 DIAGNOSIS — Z79899 Other long term (current) drug therapy: Secondary | ICD-10-CM | POA: Diagnosis not present

## 2021-02-14 DIAGNOSIS — E039 Hypothyroidism, unspecified: Secondary | ICD-10-CM | POA: Insufficient documentation

## 2021-02-14 DIAGNOSIS — S2241XA Multiple fractures of ribs, right side, initial encounter for closed fracture: Secondary | ICD-10-CM

## 2021-02-14 LAB — CBC WITH DIFFERENTIAL/PLATELET
Abs Immature Granulocytes: 0.02 10*3/uL (ref 0.00–0.07)
Basophils Absolute: 0 10*3/uL (ref 0.0–0.1)
Basophils Relative: 1 %
Eosinophils Absolute: 0 10*3/uL (ref 0.0–0.5)
Eosinophils Relative: 1 %
HCT: 36 % (ref 36.0–46.0)
Hemoglobin: 12.6 g/dL (ref 12.0–15.0)
Immature Granulocytes: 0 %
Lymphocytes Relative: 20 %
Lymphs Abs: 1.1 10*3/uL (ref 0.7–4.0)
MCH: 30.6 pg (ref 26.0–34.0)
MCHC: 35 g/dL (ref 30.0–36.0)
MCV: 87.4 fL (ref 80.0–100.0)
Monocytes Absolute: 0.5 10*3/uL (ref 0.1–1.0)
Monocytes Relative: 8 %
Neutro Abs: 3.7 10*3/uL (ref 1.7–7.7)
Neutrophils Relative %: 70 %
Platelets: 147 10*3/uL — ABNORMAL LOW (ref 150–400)
RBC: 4.12 MIL/uL (ref 3.87–5.11)
RDW: 14.6 % (ref 11.5–15.5)
WBC: 5.4 10*3/uL (ref 4.0–10.5)
nRBC: 0 % (ref 0.0–0.2)

## 2021-02-14 LAB — BASIC METABOLIC PANEL
Anion gap: 5 (ref 5–15)
BUN: 9 mg/dL (ref 8–23)
CO2: 31 mmol/L (ref 22–32)
Calcium: 8.4 mg/dL — ABNORMAL LOW (ref 8.9–10.3)
Chloride: 103 mmol/L (ref 98–111)
Creatinine, Ser: 0.83 mg/dL (ref 0.44–1.00)
GFR, Estimated: >60 mL/min (ref >60)
Glucose, Bld: 132 mg/dL — ABNORMAL HIGH (ref 70–99)
Potassium: 3.5 mmol/L (ref 3.5–5.1)
Sodium: 139 mmol/L (ref 135–145)

## 2021-02-14 MED ORDER — HYDROCODONE-ACETAMINOPHEN 5-325 MG PO TABS: 1.0000 | ORAL_TABLET | ORAL | 0 refills | Status: DC | PRN

## 2021-02-14 MED ORDER — AMLODIPINE BESYLATE 2.5 MG PO TABS: 2.5000 mg | ORAL_TABLET | Freq: Every day | ORAL | 0 refills | Status: DC

## 2021-02-14 MED ORDER — CLONIDINE HCL 0.1 MG PO TABS
0.1000 mg | ORAL_TABLET | Freq: Once | ORAL | Status: AC
  Administered 2021-02-14: 12:00:00 0.1 mg via ORAL
  Filled 2021-02-14: qty 1

## 2021-02-14 MED ORDER — LOPERAMIDE HCL 2 MG PO CAPS
2.0000 mg | ORAL_CAPSULE | ORAL | Status: DC | PRN
  Administered 2021-02-14: 14:00:00 2 mg via ORAL
  Filled 2021-02-14: qty 1

## 2021-02-14 MED ORDER — HYDROCODONE-ACETAMINOPHEN 5-325 MG PO TABS
1.0000 | ORAL_TABLET | Freq: Once | ORAL | Status: AC
  Administered 2021-02-14: 14:00:00 1 via ORAL
  Filled 2021-02-14: qty 1

## 2021-02-14 NOTE — ED Triage Notes (Signed)
Pt bib ems from Nursing home for HTN and right sided rib pain from fall 2 weeks ago. Pt hx of HTN but currently not on HTN medications per [facility] from [facility].

## 2021-02-14 NOTE — ED Notes (Signed)
[facility] called, transport for pt back to care facility arranged.  Pt to await [facility] arrival.

## 2021-02-14 NOTE — ED Notes (Signed)
Pt transferred back to care facility by [facility].

## 2021-02-14 NOTE — ED Provider Notes (Signed)
[place] DEPT Provider Note   CSN: [id number] Arrival date & time: 02/14/21  1038     History Chief Complaint  Patient presents with   Hypertension   Rib Pain    Erika Conley is a 86 y.o. female.  86 year old female presents with right-sided rib pain from recent falls.  States that she has not had any hemoptysis.  Not been short of breath.  Pain characterizes sharp and worse with movement.  Was noted also to be hypertensive and does have history of same but apparently does not take any antihypertensives.  Has had a mild headache but no confusion or emesis.  Denies any chest pain or shortness of breath.      Past Medical History:  Diagnosis Date   Anxiety    Arthritis    Depression    Falls frequently    Fibromyalgia    Fracture, clavicle    GERD (gastroesophageal reflux disease)    History of fractured vertebra    Hypertension    Hypothyroidism    Seasonal allergies    Thyroid disease    Vertigo    Vitamin A deficiency    Wrist fracture, right     Patient Active Problem List   Diagnosis Date Noted   Syncope and collapse 12/09/2018   Syncope 12/08/2018   Nausea and vomiting 12/14/2015   Hyponatremia 12/13/2015   Obstructive jaundice    Bile duct stricture    Pancreatitis 10/23/2015   Abdominal pain 10/22/2015   Pancreatic mass 10/22/2015   Common bile duct (CBD) obstruction    Abnormal LFTs    Dehydration with hyponatremia 04/09/2015   Acute respiratory failure with hypoxia ([place]) 04/09/2015   Thrombocytopenia ([place]) 04/09/2015   Hypokalemia 11/10/2014   Hypertension 11/10/2014   Hypothyroidism 11/10/2014   Syncope due to orthostatic hypotension 11/10/2014   Constipated    UTI (urinary tract infection) 06/04/2013   Altered mental status 06/04/2013    Past Surgical History:  Procedure Laterality Date   ABDOMINAL HYSTERECTOMY     BACK SURGERY     CHOLECYSTECTOMY     ERCP N/A 10/22/2015   Procedure: ENDOSCOPIC  RETROGRADE CHOLANGIOPANCREATOGRAPHY (ERCP);  Surgeon: Milus Banister, MD;  Location: [facility];  Service: Endoscopy;  Laterality: N/A;   ERCP N/A 11/25/2015   Procedure: ENDOSCOPIC RETROGRADE CHOLANGIOPANCREATOGRAPHY (ERCP);  Surgeon: Milus Banister, MD;  Location: [facility];  Service: Endoscopy;  Laterality: N/A;   EUS N/A 11/25/2015   Procedure: ESOPHAGEAL ENDOSCOPIC ULTRASOUND (EUS) RADIAL;  Surgeon: Milus Banister, MD;  Location: [facility];  Service: Endoscopy;  Laterality: N/A;   FRACTURE SURGERY     THYROIDECTOMY     WRIST SURGERY       OB History   No obstetric history on file.     Family History  Problem Relation Age of Onset   Hypertension Other     Social History   Tobacco Use   Smoking status: Never   Smokeless tobacco: Never  Vaping Use   Vaping Use: Never used  Substance Use Topics   Alcohol use: No   Drug use: No    Home Medications Prior to Admission medications   Medication Sig Start Date End Date Taking? Authorizing Provider  acetaminophen (TYLENOL) 325 MG tablet Take 650 mg by mouth every 4 (four) hours as needed for mild pain.    [provider]  busPIRone (BUSPAR) 15 MG tablet Take 22.5 mg by mouth 2 (two) times daily.    [provider]  calcium-vitamin D (OSCAL WITH D) 500-200 MG-UNIT tablet Take 1 tablet by mouth daily.    [provider]  carboxymethylcellulose 1 % ophthalmic solution Place 1 drop into both eyes as needed (for dry eyes).    [provider]  cetirizine (ZYRTEC) 10 MG tablet Take 10 mg by mouth daily.    [provider]  dicyclomine (BENTYL) 20 MG tablet Take 20 mg by mouth 3 (three) times daily.    [provider]  donepezil (ARICEPT) 10 MG tablet Take 10 mg by mouth at bedtime.    [provider]  Epinastine HCl 0.05 % ophthalmic solution Place 1 drop into both eyes 2 (two) times daily as needed (for dry eye syndrome or allergic conjunctivitis).    [provider]  famotidine (PEPCID) 20 MG tablet Take 20 mg by mouth 2 (two) times daily.    [provider]  fluticasone (FLONASE) 50 MCG/ACT nasal spray Place 1 spray into both nostrils 2 (two) times daily.     [provider]  gabapentin (NEURONTIN) 300 MG capsule Take 300 mg by mouth 2 (two) times daily.    [provider]  guaiFENesin (ROBAFEN MUCUS/CHEST CONGESTION) 100 MG/5ML liquid Take 200 mg by mouth every 4 (four) hours as needed for cough.    [provider]  hydrocortisone 2.5 % ointment Apply topically 2 (two) times daily as needed (to area on face for rash).    [provider]  hydrOXYzine (VISTARIL) 25 MG capsule Take 25 mg by mouth 3 (three) times daily.     [provider]  levothyroxine (SYNTHROID) 100 MCG tablet Take 100 mcg by mouth daily before breakfast.    [provider]  loperamide (IMODIUM) 2 MG capsule Take 2 mg by mouth daily as needed for diarrhea or loose stools. After each loose stool    [provider]  LORazepam (ATIVAN) 0.5 MG tablet Take 0.5 mg by mouth every evening.     [provider]  meclizine (ANTIVERT) 25 MG tablet Take 25 mg by mouth 3 (three) times daily as needed for dizziness.    [provider]  Multiple Vitamins-Minerals (CERTAVITE SENIOR/ANTIOXIDANT PO) Take 1 tablet by mouth daily.    [provider]  naproxen (NAPROSYN) 250 MG tablet Take 250 mg by mouth 3 (three) times daily as needed (for osteoarthritis).    [provider]  omeprazole (PRILOSEC) 40 MG capsule Take 1 capsule (40 mg total) by mouth 2 (two) times daily. Take shortly before breakfast and dinner meal 07/16/18   Milus Banister, MD  ondansetron (ZOFRAN) 8 MG tablet Take 8 mg by mouth every 8 (eight) hours as needed for nausea or vomiting.    [provider]  sertraline (ZOLOFT) 100 MG tablet Take 100 mg by mouth daily.    [provider]  simethicone (MYLICON)  962 MG chewable tablet Chew 125 mg by mouth every 6 (six) hours as needed for flatulence.    [provider]  traZODone (DESYREL) 100 MG tablet Take 1.5 tablets (150 mg total) by mouth at bedtime. 12/12/18   Ezekiel Slocumb, DO  White Petrolatum-Mineral Oil (ARTIFICIAL TEARS) OINT ophthalmic ointment Place 1 application into both eyes at bedtime.    [provider]    Allergies    Codeine, Cortisone, and Other  Review of Systems   Review of Systems  All other systems reviewed and are negative.  Physical Exam Updated Vital Signs BP Marland Kitchen)  191/74    Pulse 64    Temp 98.1 F (36.7 C) (Oral)    Resp 20    Ht 1.524 m (5')    Wt 60.8 kg    SpO2 94%    BMI 26.17 kg/m   Physical Exam Vitals and nursing note reviewed.  Constitutional:      General: She is not in acute distress.    Appearance: Normal appearance. She is well-developed. She is not toxic-appearing.  HENT:     Head: Normocephalic and atraumatic.  Eyes:     General: Lids are normal.     Conjunctiva/sclera: Conjunctivae normal.     Pupils: Pupils are equal, round, and reactive to light.  Neck:     Thyroid: No thyroid mass.     Trachea: No tracheal deviation.  Cardiovascular:     Rate and Rhythm: Normal rate and regular rhythm.     Heart sounds: Normal heart sounds. No murmur heard.   No gallop.  Pulmonary:     Effort: Pulmonary effort is normal. No respiratory distress.     Breath sounds: Normal breath sounds. No stridor. No decreased breath sounds, wheezing, rhonchi or rales.  Chest:    Abdominal:     General: There is no distension.     Palpations: Abdomen is soft.     Tenderness: There is no abdominal tenderness. There is no rebound.  Musculoskeletal:        General: No tenderness. Normal range of motion.     Cervical back: Normal range of motion and neck supple.  Skin:    General: Skin is warm and dry.     Findings: No abrasion or rash.  Neurological:     Mental Status: She is alert and  oriented to person, place, and time. Mental status is at baseline.     GCS: GCS eye subscore is 4. GCS verbal subscore is 5. GCS motor subscore is 6.     Cranial Nerves: No cranial nerve deficit.     Sensory: No sensory deficit.     Motor: Motor function is intact.  Psychiatric:        Attention and Perception: Attention normal.        Speech: Speech normal.        Behavior: Behavior normal.    ED Results / Procedures / Treatments   Labs (all labs ordered are listed, but only abnormal results are displayed) Labs Reviewed  CBC WITH DIFFERENTIAL/PLATELET  BASIC METABOLIC PANEL    EKG None  Radiology No results found.  Procedures Procedures   Medications Ordered in ED Medications - No data to display  ED Course  I have reviewed the triage vital signs and the nursing notes.  Pertinent labs & imaging results that were available during my care of the patient were reviewed by me and considered in my medical decision making (see chart for details).    MDM Rules/Calculators/A&P                         Patient treated for hypertension here with clonidine and repeat blood pressure is improved.  X-ray of her right ribs show 2 rib fractures.  No pneumothorax was noted per my interpretation.  Patient's pain treated with oral hydrocodone.  Will place on Norvasc and patient follow-up with her doctor along with hydrocodone for pain    Final Clinical Impression(s) / ED Diagnoses Final diagnoses:  None    Rx / DC Orders ED Discharge  Orders     None        Lacretia Leigh, MD 02/14/21 1421

## 2021-10-13 ENCOUNTER — Emergency Department (HOSPITAL_COMMUNITY): Payer: Medicare Other

## 2021-10-13 ENCOUNTER — Encounter (HOSPITAL_COMMUNITY): Payer: Self-pay | Admitting: Emergency Medicine

## 2021-10-13 ENCOUNTER — Emergency Department (HOSPITAL_COMMUNITY)
Admission: EM | Admit: 2021-10-13 | Discharge: 2021-10-13 | Disposition: A | Discharge status: Home / Self Care | Payer: Medicare Other | Attending: Emergency Medicine | Admitting: Emergency Medicine

## 2021-10-13 ENCOUNTER — Other Ambulatory Visit: Payer: Self-pay

## 2021-10-13 DIAGNOSIS — R0789 Other chest pain: Secondary | ICD-10-CM | POA: Insufficient documentation

## 2021-10-13 DIAGNOSIS — W010XXA Fall on same level from slipping, tripping and stumbling without subsequent striking against object, initial encounter: Secondary | ICD-10-CM | POA: Insufficient documentation

## 2021-10-13 DIAGNOSIS — M542 Cervicalgia: Secondary | ICD-10-CM | POA: Insufficient documentation

## 2021-10-13 DIAGNOSIS — M545 Low back pain, unspecified: Secondary | ICD-10-CM | POA: Insufficient documentation

## 2021-10-13 DIAGNOSIS — M25551 Pain in right hip: Secondary | ICD-10-CM | POA: Diagnosis not present

## 2021-10-13 DIAGNOSIS — R519 Headache, unspecified: Secondary | ICD-10-CM | POA: Diagnosis present

## 2021-10-13 DIAGNOSIS — R6884 Jaw pain: Secondary | ICD-10-CM | POA: Insufficient documentation

## 2021-10-13 DIAGNOSIS — Z79899 Other long term (current) drug therapy: Secondary | ICD-10-CM | POA: Insufficient documentation

## 2021-10-13 DIAGNOSIS — R109 Unspecified abdominal pain: Secondary | ICD-10-CM | POA: Diagnosis not present

## 2021-10-13 DIAGNOSIS — W19XXXA Unspecified fall, initial encounter: Secondary | ICD-10-CM

## 2021-10-13 DIAGNOSIS — T07XXXA Unspecified multiple injuries, initial encounter: Secondary | ICD-10-CM

## 2021-10-13 LAB — COMPREHENSIVE METABOLIC PANEL WITH GFR
ALT: 10 U/L (ref 0–44)
AST: 17 U/L (ref 15–41)
Albumin: 4.3 g/dL (ref 3.5–5.0)
Alkaline Phosphatase: 53 U/L (ref 38–126)
Anion gap: 9 (ref 5–15)
BUN: 9 mg/dL (ref 8–23)
CO2: 30 mmol/L (ref 22–32)
Calcium: 9.2 mg/dL (ref 8.9–10.3)
Chloride: 98 mmol/L (ref 98–111)
Creatinine, Ser: 0.73 mg/dL (ref 0.44–1.00)
GFR, Estimated: >60 mL/min
Glucose, Bld: 114 mg/dL — ABNORMAL HIGH (ref 70–99)
Potassium: 4.4 mmol/L (ref 3.5–5.1)
Sodium: 137 mmol/L (ref 135–145)
Total Bilirubin: 0.7 mg/dL (ref 0.3–1.2)
Total Protein: 6.6 g/dL (ref 6.5–8.1)

## 2021-10-13 LAB — CBC
HCT: 43.2 % (ref 36.0–46.0)
Hemoglobin: 15.3 g/dL — ABNORMAL HIGH (ref 12.0–15.0)
MCH: 30.4 pg (ref 26.0–34.0)
MCHC: 35.4 g/dL (ref 30.0–36.0)
MCV: 85.9 fL (ref 80.0–100.0)
Platelets: 161 10*3/uL (ref 150–400)
RBC: 5.03 MIL/uL (ref 3.87–5.11)
RDW: 12.5 % (ref 11.5–15.5)
WBC: 6.7 10*3/uL (ref 4.0–10.5)
nRBC: 0 % (ref 0.0–0.2)

## 2021-10-13 LAB — URINALYSIS, ROUTINE W REFLEX MICROSCOPIC
Bilirubin Urine: NEGATIVE
Glucose, UA: NEGATIVE mg/dL
Hgb urine dipstick: NEGATIVE
Ketones, ur: NEGATIVE mg/dL
Leukocytes,Ua: NEGATIVE
Nitrite: NEGATIVE
Protein, ur: NEGATIVE mg/dL
Specific Gravity, Urine: 1.013 (ref 1.005–1.030)
pH: 8 (ref 5.0–8.0)

## 2021-10-13 LAB — TROPONIN I (HIGH SENSITIVITY): Troponin I (High Sensitivity): 3 ng/L (ref <18)

## 2021-10-13 LAB — SAMPLE TO BLOOD BANK

## 2021-10-13 MED ORDER — SODIUM CHLORIDE (PF) 0.9 % IJ SOLN
INTRAMUSCULAR | Status: AC
  Filled 2021-10-13: qty 50

## 2021-10-13 MED ORDER — IOHEXOL 300 MG/ML  SOLN
100.0000 mL | Freq: Once | INTRAMUSCULAR | Status: AC | PRN
  Administered 2021-10-13: 100 mL via INTRAVENOUS

## 2021-10-13 MED ORDER — HYDROCODONE-ACETAMINOPHEN 5-325 MG PO TABS
1.0000 | ORAL_TABLET | Freq: Once | ORAL | Status: AC
  Administered 2021-10-13: 1 via ORAL
  Filled 2021-10-13: qty 1

## 2021-10-13 NOTE — ED Provider Notes (Signed)
[facility] DEPT Provider Note   CSN: [id number] Arrival date & time: 10/13/21  1345     History  Chief Complaint  Patient presents with   Lytle Michaels    Erika Conley is a 87 y.o. female.   Fall  Patient presents after ground-level fall.  Complaining of pain on the right side of her head neck left jaw right chest and right hip pain.  No loss conscious.  Not on blood thinners.  Patient is awake and playful and joking that she fell because she wanted to come here and see [place].  Denies loss consciousness.  States she was unable to get up after the fall.  States he thinks she could get up now with help.     Home Medications Prior to Admission medications   Medication Sig Start Date End Date Taking? Authorizing Provider  acetaminophen (TYLENOL) 325 MG tablet Take 650 mg by mouth every 4 (four) hours as needed for mild pain.    [provider]  amLODipine (NORVASC) 2.5 MG tablet Take 1 tablet (2.5 mg total) by mouth daily. 02/14/21   Lacretia Leigh, MD  busPIRone (BUSPAR) 15 MG tablet Take 22.5 mg by mouth 2 (two) times daily.    [provider]  calcium-vitamin D (OSCAL WITH D) 500-200 MG-UNIT tablet Take 1 tablet by mouth daily.    [provider]  carboxymethylcellulose 1 % ophthalmic solution Place 1 drop into both eyes as needed (for dry eyes).    [provider]  cetirizine (ZYRTEC) 10 MG tablet Take 10 mg by mouth daily.    [provider]  dicyclomine (BENTYL) 20 MG tablet Take 20 mg by mouth 3 (three) times daily.    [provider]  donepezil (ARICEPT) 10 MG tablet Take 10 mg by mouth at bedtime.    [provider]  Epinastine HCl 0.05 % ophthalmic solution Place 1 drop into both eyes 2 (two) times daily as needed (for dry eye syndrome or allergic conjunctivitis).    [provider]  famotidine (PEPCID) 20 MG tablet Take 20 mg by mouth 2 (two) times daily.    [provider]   fluticasone (FLONASE) 50 MCG/ACT nasal spray Place 1 spray into both nostrils 2 (two) times daily.     [provider]  gabapentin (NEURONTIN) 300 MG capsule Take 300 mg by mouth 2 (two) times daily.    [provider]  guaiFENesin (ROBAFEN MUCUS/CHEST CONGESTION) 100 MG/5ML liquid Take 200 mg by mouth every 4 (four) hours as needed for cough.    [provider]  HYDROcodone-acetaminophen (NORCO/VICODIN) 5-325 MG tablet Take 1-2 tablets by mouth every 4 (four) hours as needed. 02/14/21   Lacretia Leigh, MD  hydrocortisone 2.5 % ointment Apply topically 2 (two) times daily as needed (to area on face for rash).    [provider]  hydrOXYzine (VISTARIL) 25 MG capsule Take 25 mg by mouth 3 (three) times daily.     [provider]  levothyroxine (SYNTHROID) 100 MCG tablet Take 100 mcg by mouth daily before breakfast.    [provider]  loperamide (IMODIUM) 2 MG capsule Take 2 mg by mouth daily as needed for diarrhea or loose stools. After each loose stool    [provider]  LORazepam (ATIVAN) 0.5 MG tablet Take 0.5 mg by mouth every evening.     [provider]  meclizine (ANTIVERT) 25 MG tablet Take 25 mg by mouth 3 (three) times daily as  needed for dizziness.    [provider]  Multiple Vitamins-Minerals (CERTAVITE SENIOR/ANTIOXIDANT PO) Take 1 tablet by mouth daily.    [provider]  naproxen (NAPROSYN) 250 MG tablet Take 250 mg by mouth 3 (three) times daily as needed (for osteoarthritis).    [provider]  omeprazole (PRILOSEC) 40 MG capsule Take 1 capsule (40 mg total) by mouth 2 (two) times daily. Take shortly before breakfast and dinner meal 07/16/18   Milus Banister, MD  ondansetron (ZOFRAN) 8 MG tablet Take 8 mg by mouth every 8 (eight) hours as needed for nausea or vomiting.    [provider]  sertraline (ZOLOFT) 100 MG tablet Take 100 mg by mouth daily.    [provider]  simethicone (MYLICON) 782 MG chewable tablet Chew 125 mg by mouth every 6 (six) hours as needed for flatulence.    [provider]  traZODone (DESYREL) 100 MG tablet Take 1.5 tablets (150 mg total) by mouth at bedtime. 12/12/18   Ezekiel Slocumb, DO  White Petrolatum-Mineral Oil (ARTIFICIAL TEARS) OINT ophthalmic ointment Place 1 application into both eyes at bedtime.    [provider]      Allergies    Codeine, Cortisone, and Other    Review of Systems   Review of Systems  Physical Exam Updated Vital Signs BP (!) 170/94   Pulse 60   Temp 98.4 F (36.9 C) (Oral)   Resp 14   SpO2 93%  Physical Exam Vitals and nursing note reviewed.  HENT:     Head: Normocephalic.     Comments: Mild tenderness right temple area.  Tenderness to left mandible. Eyes:     Extraocular Movements: Extraocular movements intact.     Pupils: Pupils are equal, round, and reactive to light.  Neck:     Comments: Midline tenderness over lower neck. Cardiovascular:     Rate and Rhythm: Regular rhythm.  Pulmonary:     Comments: Tenderness to right chest wall without crepitance or subcu emphysema. Chest:     Chest wall: Tenderness present.  Abdominal:     Tenderness: There is abdominal tenderness.     Comments: Diffuse tenderness abdomen.  No ecchymosis.  Musculoskeletal:     Cervical back: Tenderness present.     Comments: Tenderness to right hip with decreased range of motion.  No knee tenderness.  Also lumbar tenderness.  Skin:    General: Skin is warm.     Capillary Refill: Capillary refill takes less than 2 seconds.  Neurological:     Mental Status: She is alert and oriented to person, place, and time.     ED Results / Procedures / Treatments   Labs (all labs ordered are listed, but only abnormal results are displayed) Labs Reviewed  COMPREHENSIVE METABOLIC PANEL - Abnormal; Notable for the following components:      Result Value   Glucose, Bld 114 (*)    All other  components within normal limits  CBC - Abnormal; Notable for the following components:   Hemoglobin 15.3 (*)    All other components within normal limits  SAMPLE TO BLOOD BANK    EKG None  Radiology DG Chest 1 View  Result Date: 10/13/2021 CLINICAL DATA:  Trauma, fall, right chest pain EXAM: CHEST  1 VIEW COMPARISON:  02/14/2021 FINDINGS: Transverse diameter of heart is increased. There are no signs of pulmonary edema or focal pulmonary consolidation. There is minimal blunting of left lateral CP angle. There is no  pneumothorax. Deformity in proximal left humerus has not changed suggesting old fracture. There is evidence of previous vertebroplasty at thoracolumbar junction. Surgical clips are seen in upper abdomen. IMPRESSION: Cardiomegaly. There are no signs of pulmonary edema or focal pulmonary consolidation. Blunting of left lateral CP angle may be due to minimal pleural effusion or pleural thickening. There is no pneumothorax. Electronically Signed   By: Elmer Picker M.D.   On: 10/13/2021 14:56   DG Hip Unilat W or Wo Pelvis 2-3 Views Right  Result Date: 10/13/2021 CLINICAL DATA:  fall EXAM: DG HIP (WITH OR WITHOUT PELVIS) 2-3V RIGHT COMPARISON:  None Available. FINDINGS: No evidence of acute fracture or dislocation. Moderate left and mild right hip degenerative change. Osteopenia. Partially imaged lower lumbar degenerative change. IMPRESSION: No evidence of acute fracture or dislocation. Electronically Signed   By: Margaretha Sheffield M.D.   On: 10/13/2021 14:55    Procedures Procedures    Medications Ordered in ED Medications  sodium chloride (PF) 0.9 % injection (  Given 10/13/21 1536)  iohexol (OMNIPAQUE) 300 MG/ML solution 100 mL (100 mLs Intravenous Contrast Given 10/13/21 1637)    ED Course/ Medical Decision Making/ A&P                           Medical Decision Making Amount and/or Complexity of Data Reviewed Labs: ordered. Radiology: ordered.  Risk Prescription  drug management.   Patient with fall.  Multiple different potential injuries.  Tenderness to head neck chest left jaw lower back abdomen and right hip.  Not on blood thinners.  We will get blood work and imaging.  Differential diagnosis includes many different traumatic injuries  Chest x-ray and hip x-ray reassuring.  CT scan still pending.  Care will be turned over to Dr. Wyvonnia Dusky.        Final Clinical Impression(s) / ED Diagnoses Final diagnoses:  Fall, initial encounter    Rx / DC Orders ED Discharge Orders     None         Davonna Belling, MD 10/13/21 [phone]

## 2021-10-13 NOTE — ED Provider Notes (Signed)
Care assumed from Dr. Alvino Chapel.  Patient with right-sided pain after falling.  Injured right jaw, chest and hip.  No blood thinner use.  CT scans are pending.  Results reviewed and interpreted by me.  Head and C-spine are negative for acute traumatic injury.  CT chest shows nondisplaced rib fractures on the left.  No intrathoracic or intra-abdominal injury.  Patient does not appear to be tender over these left-sided rib fractures. This are age indeterminate per radiology.  She did fracture ribs on the right side in December. She has chronic compression deformities involving thoracic and lumbar spine.  She is to tolerate p.o. and ambulate with her walker. Urinalysis is negative.  She appears stable to return to her facility  BP (!) 148/69   Pulse 70   Temp 98.5 F (36.9 C) (Oral)   Resp 16   SpO2 95%     Ezequiel Essex, MD 10/13/21 1850

## 2021-10-13 NOTE — ED Triage Notes (Signed)
BIBA Per EMS: Pt coming from [facility] w/ c/o fall. Right sided pain. Pt also c/o neck pain. Pt arrives in c-collar. Hematoma on head. Denies LOC, thinners. A&Ox4.  146/84 60 HR  16 RR 96% RA

## 2021-10-13 NOTE — Discharge Instructions (Addendum)
Your testing is reassuring.  It appears she has some old rib fractures on the left side but no new fractures today.  Follow-up with your primary doctor.  Return to the ED with new or worsening symptoms.

## 2022-04-03 ENCOUNTER — Emergency Department (HOSPITAL_COMMUNITY): Payer: Medicare Other

## 2022-04-03 ENCOUNTER — Inpatient Hospital Stay (HOSPITAL_COMMUNITY)
Admission: EM | Admit: 2022-04-03 | Discharge: 2022-04-18 | DRG: 552 | Disposition: A | Discharge status: Skilled Nursing Facility | Payer: Medicare Other | Source: Skilled Nursing Facility | Attending: Internal Medicine | Admitting: Internal Medicine

## 2022-04-03 ENCOUNTER — Inpatient Hospital Stay (HOSPITAL_COMMUNITY): Payer: Medicare Other

## 2022-04-03 DIAGNOSIS — F419 Anxiety disorder, unspecified: Secondary | ICD-10-CM | POA: Insufficient documentation

## 2022-04-03 DIAGNOSIS — Z7951 Long term (current) use of inhaled steroids: Secondary | ICD-10-CM

## 2022-04-03 DIAGNOSIS — W182XXA Fall in (into) shower or empty bathtub, initial encounter: Secondary | ICD-10-CM

## 2022-04-03 DIAGNOSIS — K219 Gastro-esophageal reflux disease without esophagitis: Secondary | ICD-10-CM | POA: Diagnosis present

## 2022-04-03 DIAGNOSIS — F32A Depression, unspecified: Secondary | ICD-10-CM | POA: Diagnosis present

## 2022-04-03 DIAGNOSIS — Z91018 Allergy to other foods: Secondary | ICD-10-CM | POA: Diagnosis not present

## 2022-04-03 DIAGNOSIS — Z886 Allergy status to analgesic agent status: Secondary | ICD-10-CM | POA: Diagnosis not present

## 2022-04-03 DIAGNOSIS — Z9181 History of falling: Secondary | ICD-10-CM | POA: Diagnosis not present

## 2022-04-03 DIAGNOSIS — I10 Essential (primary) hypertension: Secondary | ICD-10-CM | POA: Insufficient documentation

## 2022-04-03 DIAGNOSIS — K529 Noninfective gastroenteritis and colitis, unspecified: Secondary | ICD-10-CM | POA: Diagnosis not present

## 2022-04-03 DIAGNOSIS — S32012G Unstable burst fracture of first lumbar vertebra, subsequent encounter for fracture with delayed healing: Secondary | ICD-10-CM | POA: Diagnosis not present

## 2022-04-03 DIAGNOSIS — F03A3 Unspecified dementia, mild, with mood disturbance: Secondary | ICD-10-CM | POA: Diagnosis present

## 2022-04-03 DIAGNOSIS — Z888 Allergy status to other drugs, medicaments and biological substances status: Secondary | ICD-10-CM | POA: Diagnosis not present

## 2022-04-03 DIAGNOSIS — E039 Hypothyroidism, unspecified: Secondary | ICD-10-CM | POA: Diagnosis present

## 2022-04-03 DIAGNOSIS — S32012D Unstable burst fracture of first lumbar vertebra, subsequent encounter for fracture with routine healing: Secondary | ICD-10-CM

## 2022-04-03 DIAGNOSIS — M810 Age-related osteoporosis without current pathological fracture: Secondary | ICD-10-CM | POA: Diagnosis present

## 2022-04-03 DIAGNOSIS — Z90710 Acquired absence of both cervix and uterus: Secondary | ICD-10-CM

## 2022-04-03 DIAGNOSIS — Z8249 Family history of ischemic heart disease and other diseases of the circulatory system: Secondary | ICD-10-CM

## 2022-04-03 DIAGNOSIS — R0902 Hypoxemia: Secondary | ICD-10-CM | POA: Diagnosis present

## 2022-04-03 DIAGNOSIS — R55 Syncope and collapse: Secondary | ICD-10-CM | POA: Diagnosis not present

## 2022-04-03 DIAGNOSIS — Z87310 Personal history of (healed) osteoporosis fracture: Secondary | ICD-10-CM

## 2022-04-03 DIAGNOSIS — Z66 Do not resuscitate: Secondary | ICD-10-CM | POA: Diagnosis present

## 2022-04-03 DIAGNOSIS — Z79890 Hormone replacement therapy (postmenopausal): Secondary | ICD-10-CM | POA: Diagnosis not present

## 2022-04-03 DIAGNOSIS — E876 Hypokalemia: Secondary | ICD-10-CM | POA: Diagnosis present

## 2022-04-03 DIAGNOSIS — M532X6 Spinal instabilities, lumbar region: Secondary | ICD-10-CM | POA: Diagnosis present

## 2022-04-03 DIAGNOSIS — Z885 Allergy status to narcotic agent status: Secondary | ICD-10-CM | POA: Diagnosis not present

## 2022-04-03 DIAGNOSIS — F03A4 Unspecified dementia, mild, with anxiety: Secondary | ICD-10-CM | POA: Diagnosis present

## 2022-04-03 DIAGNOSIS — Y93E1 Activity, personal bathing and showering: Secondary | ICD-10-CM

## 2022-04-03 DIAGNOSIS — Z79899 Other long term (current) drug therapy: Secondary | ICD-10-CM

## 2022-04-03 DIAGNOSIS — D696 Thrombocytopenia, unspecified: Secondary | ICD-10-CM | POA: Diagnosis present

## 2022-04-03 DIAGNOSIS — S32010A Wedge compression fracture of first lumbar vertebra, initial encounter for closed fracture: Secondary | ICD-10-CM | POA: Diagnosis present

## 2022-04-03 DIAGNOSIS — E890 Postprocedural hypothyroidism: Secondary | ICD-10-CM | POA: Diagnosis present

## 2022-04-03 DIAGNOSIS — L8990 Pressure ulcer of unspecified site, unspecified stage: Secondary | ICD-10-CM | POA: Insufficient documentation

## 2022-04-03 DIAGNOSIS — S32020A Wedge compression fracture of second lumbar vertebra, initial encounter for closed fracture: Secondary | ICD-10-CM | POA: Diagnosis present

## 2022-04-03 DIAGNOSIS — F0390 Unspecified dementia without behavioral disturbance: Secondary | ICD-10-CM | POA: Insufficient documentation

## 2022-04-03 DIAGNOSIS — S32018A Other fracture of first lumbar vertebra, initial encounter for closed fracture: Secondary | ICD-10-CM | POA: Diagnosis present

## 2022-04-03 DIAGNOSIS — M797 Fibromyalgia: Secondary | ICD-10-CM | POA: Diagnosis present

## 2022-04-03 DIAGNOSIS — S32009A Unspecified fracture of unspecified lumbar vertebra, initial encounter for closed fracture: Secondary | ICD-10-CM | POA: Diagnosis present

## 2022-04-03 LAB — CBC WITH DIFFERENTIAL/PLATELET
Abs Immature Granulocytes: 0.03 10*3/uL (ref 0.00–0.07)
Basophils Absolute: 0 10*3/uL (ref 0.0–0.1)
Basophils Relative: 0 %
Eosinophils Absolute: 0 10*3/uL (ref 0.0–0.5)
Eosinophils Relative: 0 %
HCT: 41.7 % (ref 36.0–46.0)
Hemoglobin: 14.2 g/dL (ref 12.0–15.0)
Immature Granulocytes: 0 %
Lymphocytes Relative: 9 %
Lymphs Abs: 0.7 10*3/uL (ref 0.7–4.0)
MCH: 29.5 pg (ref 26.0–34.0)
MCHC: 34.1 g/dL (ref 30.0–36.0)
MCV: 86.5 fL (ref 80.0–100.0)
Monocytes Absolute: 0.6 10*3/uL (ref 0.1–1.0)
Monocytes Relative: 8 %
Neutro Abs: 6.9 10*3/uL (ref 1.7–7.7)
Neutrophils Relative %: 83 %
Platelets: 133 10*3/uL — ABNORMAL LOW (ref 150–400)
RBC: 4.82 MIL/uL (ref 3.87–5.11)
RDW: 13.4 % (ref 11.5–15.5)
WBC: 8.3 10*3/uL (ref 4.0–10.5)
nRBC: 0 % (ref 0.0–0.2)

## 2022-04-03 LAB — BASIC METABOLIC PANEL
Anion gap: 10 (ref 5–15)
BUN: 9 mg/dL (ref 8–23)
CO2: 29 mmol/L (ref 22–32)
Calcium: 8.7 mg/dL — ABNORMAL LOW (ref 8.9–10.3)
Chloride: 99 mmol/L (ref 98–111)
Creatinine, Ser: 0.7 mg/dL (ref 0.44–1.00)
GFR, Estimated: >60 mL/min (ref >60)
Glucose, Bld: 120 mg/dL — ABNORMAL HIGH (ref 70–99)
Potassium: 3.4 mmol/L — ABNORMAL LOW (ref 3.5–5.1)
Sodium: 138 mmol/L (ref 135–145)

## 2022-04-03 MED ORDER — FENTANYL CITRATE PF 50 MCG/ML IJ SOSY: 25.0000 ug | PREFILLED_SYRINGE | INTRAMUSCULAR | Status: DC | PRN

## 2022-04-03 MED ORDER — SODIUM CHLORIDE 0.9 % IV SOLN: Freq: Once | INTRAVENOUS | Status: AC

## 2022-04-03 MED ORDER — AMLODIPINE BESYLATE 5 MG PO TABS
5.0000 mg | ORAL_TABLET | Freq: Every day | ORAL | Status: DC
  Administered 2022-04-03 – 2022-04-04 (×2): 5 mg via ORAL
  Filled 2022-04-03 (×2): qty 1

## 2022-04-03 MED ORDER — ONDANSETRON HCL 4 MG/2ML IJ SOLN
4.0000 mg | Freq: Four times a day (QID) | INTRAMUSCULAR | Status: DC | PRN
  Administered 2022-04-07 – 2022-04-17 (×2): 4 mg via INTRAVENOUS
  Filled 2022-04-03 (×2): qty 2

## 2022-04-03 MED ORDER — POTASSIUM CHLORIDE CRYS ER 20 MEQ PO TBCR
40.0000 meq | EXTENDED_RELEASE_TABLET | Freq: Once | ORAL | Status: AC
  Administered 2022-04-03: 40 meq via ORAL
  Filled 2022-04-03: qty 2

## 2022-04-03 MED ORDER — HYDROXYZINE PAMOATE 25 MG PO CAPS
25.0000 mg | ORAL_CAPSULE | Freq: Three times a day (TID) | ORAL | Status: DC | PRN
  Filled 2022-04-03 (×2): qty 1

## 2022-04-03 MED ORDER — HYDROXYZINE HCL 25 MG PO TABS
25.0000 mg | ORAL_TABLET | Freq: Three times a day (TID) | ORAL | Status: DC | PRN
  Administered 2022-04-03 – 2022-04-06 (×3): 25 mg via ORAL
  Filled 2022-04-03 (×3): qty 1

## 2022-04-03 MED ORDER — ACETAMINOPHEN 325 MG PO TABS: 650.0000 mg | ORAL_TABLET | Freq: Four times a day (QID) | ORAL | Status: DC | PRN

## 2022-04-03 MED ORDER — FAMOTIDINE 20 MG PO TABS
20.0000 mg | ORAL_TABLET | Freq: Two times a day (BID) | ORAL | Status: DC
  Administered 2022-04-03 – 2022-04-18 (×31): 20 mg via ORAL
  Filled 2022-04-03 (×32): qty 1

## 2022-04-03 MED ORDER — ACETAMINOPHEN 650 MG RE SUPP: 650.0000 mg | Freq: Four times a day (QID) | RECTAL | Status: DC | PRN

## 2022-04-03 MED ORDER — POLYETHYLENE GLYCOL 3350 17 G PO PACK
17.0000 g | PACK | Freq: Every day | ORAL | Status: DC
  Administered 2022-04-04 – 2022-04-18 (×11): 17 g via ORAL
  Filled 2022-04-03 (×14): qty 1

## 2022-04-03 MED ORDER — PANTOPRAZOLE SODIUM 40 MG PO TBEC
40.0000 mg | DELAYED_RELEASE_TABLET | Freq: Two times a day (BID) | ORAL | Status: DC
  Administered 2022-04-03 – 2022-04-18 (×31): 40 mg via ORAL
  Filled 2022-04-03 (×30): qty 1

## 2022-04-03 MED ORDER — ONDANSETRON HCL 4 MG/2ML IJ SOLN
4.0000 mg | Freq: Once | INTRAMUSCULAR | Status: AC
  Administered 2022-04-03: 4 mg via INTRAVENOUS
  Filled 2022-04-03: qty 2

## 2022-04-03 MED ORDER — LEVOTHYROXINE SODIUM 100 MCG PO TABS
100.0000 ug | ORAL_TABLET | Freq: Every day | ORAL | Status: DC
  Administered 2022-04-03 – 2022-04-18 (×16): 100 ug via ORAL
  Filled 2022-04-03 (×16): qty 1

## 2022-04-03 MED ORDER — SENNOSIDES-DOCUSATE SODIUM 8.6-50 MG PO TABS: 1.0000 | ORAL_TABLET | Freq: Every evening | ORAL | Status: DC | PRN

## 2022-04-03 MED ORDER — LACTATED RINGERS IV SOLN: INTRAVENOUS | Status: AC

## 2022-04-03 MED ORDER — ENOXAPARIN SODIUM 40 MG/0.4ML IJ SOSY
40.0000 mg | PREFILLED_SYRINGE | INTRAMUSCULAR | Status: DC
  Administered 2022-04-04 – 2022-04-05 (×2): 40 mg via SUBCUTANEOUS
  Filled 2022-04-03: qty 0.4

## 2022-04-03 MED ORDER — SERTRALINE HCL 100 MG PO TABS
200.0000 mg | ORAL_TABLET | Freq: Every day | ORAL | Status: DC
  Administered 2022-04-03 – 2022-04-18 (×16): 200 mg via ORAL
  Filled 2022-04-03 (×6): qty 2
  Filled 2022-04-03: qty 4
  Filled 2022-04-03 (×9): qty 2

## 2022-04-03 MED ORDER — FENTANYL CITRATE PF 50 MCG/ML IJ SOSY
25.0000 ug | PREFILLED_SYRINGE | INTRAMUSCULAR | Status: DC | PRN
  Administered 2022-04-03 – 2022-04-05 (×7): 25 ug via INTRAVENOUS
  Filled 2022-04-03 (×7): qty 1

## 2022-04-03 MED ORDER — METHOCARBAMOL 500 MG PO TABS
500.0000 mg | ORAL_TABLET | Freq: Three times a day (TID) | ORAL | Status: DC
  Administered 2022-04-03 – 2022-04-18 (×46): 500 mg via ORAL
  Filled 2022-04-03 (×46): qty 1

## 2022-04-03 MED ORDER — BUSPIRONE HCL 10 MG PO TABS
30.0000 mg | ORAL_TABLET | Freq: Two times a day (BID) | ORAL | Status: DC
  Administered 2022-04-03 – 2022-04-18 (×31): 30 mg via ORAL
  Filled 2022-04-03 (×31): qty 3

## 2022-04-03 MED ORDER — DONEPEZIL HCL 10 MG PO TABS
10.0000 mg | ORAL_TABLET | Freq: Every day | ORAL | Status: DC
  Administered 2022-04-03 – 2022-04-17 (×15): 10 mg via ORAL
  Filled 2022-04-03 (×15): qty 1

## 2022-04-03 MED ORDER — TRAMADOL HCL 50 MG PO TABS
50.0000 mg | ORAL_TABLET | Freq: Four times a day (QID) | ORAL | Status: DC | PRN
  Administered 2022-04-03 – 2022-04-06 (×7): 50 mg via ORAL
  Filled 2022-04-03 (×8): qty 1

## 2022-04-03 MED ORDER — LIDOCAINE 5 % EX PTCH
1.0000 | MEDICATED_PATCH | Freq: Every day | CUTANEOUS | Status: DC
  Administered 2022-04-03 – 2022-04-18 (×16): 1 via TRANSDERMAL
  Filled 2022-04-03 (×16): qty 1

## 2022-04-03 MED ORDER — ONDANSETRON HCL 4 MG PO TABS
4.0000 mg | ORAL_TABLET | Freq: Four times a day (QID) | ORAL | Status: DC | PRN
  Administered 2022-04-12: 4 mg via ORAL
  Filled 2022-04-03: qty 1

## 2022-04-03 MED ORDER — LORAZEPAM 0.5 MG PO TABS
0.5000 mg | ORAL_TABLET | Freq: Every day | ORAL | Status: DC
  Administered 2022-04-03 – 2022-04-07 (×5): 0.5 mg via ORAL
  Filled 2022-04-03 (×5): qty 1

## 2022-04-03 MED ORDER — ACETAMINOPHEN 500 MG PO TABS
1000.0000 mg | ORAL_TABLET | Freq: Three times a day (TID) | ORAL | Status: DC
  Administered 2022-04-03 – 2022-04-06 (×12): 1000 mg via ORAL
  Filled 2022-04-03 (×13): qty 2

## 2022-04-03 NOTE — Progress Notes (Addendum)
No Charge Progress Note  Patient was admitted early of this morning.  Awaiting transfer from Erika Conley, ED to [facility] for Neurosurgery evaluation.  Patient seen in the ED.  Hospital course 88 year old female, ALF resident, ambulates with the help of a walker, history of vertigo with frequent falls, this being her second fall this year, lost her balance and fell into the shower, hitting her head behind her right ear with transient loss of consciousness followed by intractable pain across mid and lower back limiting her to bed, actually denies radiation of pain to the legs.  No sphincter disturbances.  States that opioid pain meds because "change in my personality".  As per ED RN, transiently dropped oxygen saturations to 88% on room air and was placed on [place] oxygen.  Subjective: Ongoing severe, 9/10 pain in cross mid and lower back without radiation.  Minimal activity makes it worse.  Objective Elderly female lying comfortably supine in bed without distress.  Transiently mild hypertensive, may have been due to pain but has improved.  Other vital signs stable.  Currently does not appear to be on [place] oxygen and saturating in the high 90s.  Telemetry personally reviewed: Sinus rhythm. RS: Clear to auscultation.  No increased work of breathing. CV: S1 and S2 heard, RRR.  No JVD, murmurs or pedal edema. Abdomen: Large midline laparotomy scar.  Nondistended, soft and nontender.  Normal bowel sounds heard. CNS: Alert and oriented.  No focal neurological deficits. Extremities: Symmetric 5 x 5 power but her extremity power assessment seems to be limited from pain. HEENT: Did not appreciate any bump or bruising over the right scalp behind the right ear.  Labs Significant for potassium of 3.4 (has not been replaced yet) and platelets of 133. Imaging studies including CT head, C-spine, thoracic spine and lumbar spine results appreciated.  Assessment and plan:  Mechanical fall followed by intractable low  back pain due to unstable Chance fracture of L1: There are other abnormal findings on the CT reports.  EDP has consulted Neurosurgery/Dr. Arnoldo Morale.  Patient awaiting transfer to [facility] for their evaluation.  Multimodality pain control: Scheduled Tylenol, scheduled Robaxin, topical lidocaine patch, as needed tramadol for moderate pain and IV fentanyl for severe pain.  Reviewed allergy to opioids with patient and as noted above plus passing out and falls listed.  No history of throat swelling, difficulty breathing, stridor or breaking out into a rash or hives.  Agreeable to try time-limited tramadol until pain is better controlled on nonopioids.  Monitor closely.  Hypokalemia: Replace and follow.  Mild thrombocytopenia: Follow CBC in a.m.  Syncope: Details surrounding the event are not clear.  It appears that she first fell followed by passing out.  She does not drive.  Agree with telemetry monitoring for arrhythmias.  Last 2D echo in October 2020 had normal LVEF and no aortic stenosis, requested repeat echo.  Could consider CUS.  Also check orthostatic vital signs when cleared for activity by neurosurgery.  Hypoxia: Appears to be transient.  Currently saturating normally on room air.  Discussed with the ED RN to monitor closely.  Oxygen support as needed.  Incentive spirometry.  Rest as per admitting MDs H&P.  Discussed with patient placement RN regarding need for transfer to [facility].  Vernell Leep, MD,  FACP, [place], [facility], [facility], [facility]   [facility]      To contact the attending provider between 7A-7P or the covering provider during after hours 7P-7A, please log into the web  site [email or web address] and access using universal  password for that web site. If you do not have the password, please call the hospital operator.

## 2022-04-03 NOTE — ED Notes (Signed)
Pt transported to MRI via stretcher.  

## 2022-04-03 NOTE — Progress Notes (Signed)
Orthopedic Tech Progress Note Patient Details:  Erika Conley 07-25-1934 [id number]  Ortho Devices Type of Ortho Device: Thoracolumbar corset (TLSO) Ortho Device/Splint Location: back Ortho Device/Splint Interventions: Ordered, Application, Adjustment  TLSO applied in supine secondary to precautions and orders to be worn at all times.  Post Interventions Patient Tolerated: Fair  Daichi Moris OTR/L 04/03/2022, 2:53 PM

## 2022-04-03 NOTE — H&P (Addendum)
PCP:   Pcp, No   Chief Complaint:  Falls/back pain/inability to walk  HPI: This is a 88 year old female with past medical history of anxiety and depression, hypertension, hypothyroidism, fibromyalgia.  She resides at an independent living facility.  At baseline patient uses a cane.  Two nights ago she fell in the bathroom.  She lost her balance and fell into the shower with her walker on top of her. It was an unwitnessed fall but she was able to get immediate help. She did hit the right side of her head and briefly lost consciousness.  She hit/twisted her back and has had significant lower back pain.  She has been mostly in bed since secondary her lower back pain.  She has had a headache since but she denies confusion or any vision changes.  Patient has a history of AMD.  Pain made her come to the ER.  Her pain radiates down to both lower extremities.  She denies bladder or bowel incontinence.  She denies any confusion.  The the ER shows a unstable L1 Chance fracture  Review of Systems:  The patient denies anorexia, fever, weight loss,, vision loss, decreased hearing, hoarseness, chest pain, syncope, dyspnea on exertion, peripheral edema, balance deficits, hemoptysis, abdominal pain, melena, hematochezia, severe indigestion/heartburn, hematuria, incontinence, genital sores, muscle weakness, suspicious skin lesions, transient blindness, depression, unusual weight change, abnormal bleeding, enlarged lymph nodes, angioedema, and breast masses. Positive: Fall, gait abnormality, back pain, inability to ambulate, headache   Past Medical History: Past Medical History:  Diagnosis Date   Anxiety    Arthritis    Depression    Falls frequently    Fibromyalgia    Fracture, clavicle    GERD (gastroesophageal reflux disease)    History of fractured vertebra    Hypertension    Hypothyroidism    Seasonal allergies    Thyroid disease    Vertigo    Vitamin A deficiency    Wrist fracture, right     Past Surgical History:  Procedure Laterality Date   ABDOMINAL HYSTERECTOMY     BACK SURGERY     CHOLECYSTECTOMY     ERCP N/A 10/22/2015   Procedure: ENDOSCOPIC RETROGRADE CHOLANGIOPANCREATOGRAPHY (ERCP);  Surgeon: Milus Banister, MD;  Location: [facility];  Service: Endoscopy;  Laterality: N/A;   ERCP N/A 11/25/2015   Procedure: ENDOSCOPIC RETROGRADE CHOLANGIOPANCREATOGRAPHY (ERCP);  Surgeon: Milus Banister, MD;  Location: [facility];  Service: Endoscopy;  Laterality: N/A;   EUS N/A 11/25/2015   Procedure: ESOPHAGEAL ENDOSCOPIC ULTRASOUND (EUS) RADIAL;  Surgeon: Milus Banister, MD;  Location: [facility];  Service: Endoscopy;  Laterality: N/A;   FRACTURE SURGERY     THYROIDECTOMY     WRIST SURGERY      Medications: Prior to Admission medications   Medication Sig Start Date End Date Taking? Authorizing Provider  acetaminophen (TYLENOL) 325 MG tablet Take 650 mg by mouth every 4 (four) hours as needed for mild pain, fever or headache.    [provider]  amLODipine (NORVASC) 2.5 MG tablet Take 1 tablet (2.5 mg total) by mouth daily. Patient not taking: Reported on 10/13/2021 02/14/21   Lacretia Leigh, MD  amLODipine (NORVASC) 5 MG tablet Take 5 mg by mouth in the morning.    [provider]  Biotin 5000 MCG CAPS Take 5,000 mcg by mouth in the morning.    [provider]  busPIRone (BUSPAR) 30 MG tablet Take 30 mg by mouth in the morning and at bedtime.  [provider]  calcium carbonate (TUMS - DOSED IN MG ELEMENTAL CALCIUM) 500 MG chewable tablet Chew 2-3 tablets by mouth 3 (three) times daily with meals as needed (for GERD).    [provider]  Carboxymethylcellul-Glycerin (REFRESH OPTIVE PF OP) Place 1 drop into both eyes every 4 (four) hours as needed (for dryness).    [provider]  cetirizine (ZYRTEC) 10 MG tablet Take 10 mg by mouth daily.    [provider]  diclofenac Sodium (VOLTAREN) 1 % GEL Apply 2 g  topically every 6 (six) hours as needed (for bilateral knees or back (PAIN)).    [provider]  dicyclomine (BENTYL) 20 MG tablet Take 20 mg by mouth 3 (three) times daily.    [provider]  donepezil (ARICEPT) 10 MG tablet Take 10 mg by mouth at bedtime.    [provider]  famotidine (PEPCID) 20 MG tablet Take 20 mg by mouth 2 (two) times daily.    [provider]  fluticasone (FLONASE) 50 MCG/ACT nasal spray Place 2 sprays into both nostrils daily.    [provider]  HYDROcodone-acetaminophen (NORCO/VICODIN) 5-325 MG tablet Take 1-2 tablets by mouth every 4 (four) hours as needed. Patient not taking: Reported on 10/13/2021 02/14/21   Lacretia Leigh, MD  hydrOXYzine (VISTARIL) 25 MG capsule Take 25 mg by mouth every 8 (eight) hours as needed for anxiety.    [provider]  ibuprofen (ADVIL) 800 MG tablet Take 800 mg by mouth every 8 (eight) hours as needed (for rib pain).    [provider]  levothyroxine (SYNTHROID) 100 MCG tablet Take 100 mcg by mouth daily before breakfast.    [provider]  loperamide (IMODIUM A-D) 2 MG tablet Take 4 mg by mouth See admin instructions. Take 4 mg by mouth after 1st loose stool movement, as directed    [provider]  LORazepam (ATIVAN) 0.5 MG tablet Take 0.5 mg by mouth every evening.     [provider]  meclizine (ANTIVERT) 25 MG tablet Take 25 mg by mouth 3 (three) times daily as needed for dizziness.    [provider]  Multiple Vitamins-Minerals (PRESERVISION AREDS 2) CAPS Take 1 capsule by mouth in the morning and at bedtime.    [provider]  omeprazole (PRILOSEC) 40 MG capsule Take 1 capsule (40 mg total) by mouth 2 (two) times daily. Take shortly before breakfast and dinner meal Patient taking differently: Take 40 mg by mouth See admin instructions. Take 40 mg by mouth before breakfast and evening meal 07/16/18   Milus Banister, MD   sertraline (ZOLOFT) 100 MG tablet Take 200 mg by mouth in the morning.    [provider]  traZODone (DESYREL) 100 MG tablet Take 1.5 tablets (150 mg total) by mouth at bedtime. 12/12/18   Nicole Kindred A, DO  VAGISIL 1 % CREA Apply 1 Application topically 4 (four) times daily as needed (for itching).    [provider]    Allergies:   Allergies  Allergen Reactions   Codeine Other (See Comments)    "Passes out and falls"   Cortisone Other (See Comments)    "Passes out and falls"   Morphine Other (See Comments)    Reaction (?)   Nsaids Other (See Comments)    Reaction (?)   Other Other (See Comments)    All narcotics cause patient to "pass out and fall"     Social History:  reports that  she has never smoked. She has never used smokeless tobacco. She reports that she does not drink alcohol and does not use drugs.  Family History: Family History  Problem Relation Age of Onset   Hypertension Other     Physical Exam: Vitals:   04/03/22 0327 04/03/22 0519  BP: (!) 161/66 (!) 175/68  Pulse: 65 68  Resp: 18 15  Temp: 99.6 F (37.6 C)   TempSrc: Oral   SpO2: 92% 96%    General:  Alert and oriented times three, well developed and nourished, no acute distress Eyes: PERRLA, pink conjunctiva, no scleral icterus ENT: Moist oral mucosa, neck supple, no thyromegaly Lungs: clear to ascultation, no wheeze, no crackles, no use of accessory muscles Cardiovascular: regular rate and rhythm, no regurgitation, no gallops, no murmurs. No carotid bruits, no JVD Abdomen: soft, positive BS, non-tender, non-distended, no organomegaly, not an acute abdomen GU: not examined Neuro: CN II - XII grossly intact, sensation intact Musculoskeletal: Tenderness to palpation over lower T and upper L spine Skin: no rash, no subcutaneous crepitation, no decubitus Psych: appropriate patient   Labs on Admission:  Recent Labs    04/03/22 0516  WBC 8.3  NEUTROABS 6.9  HGB 14.2   HCT 41.7  MCV 86.5  PLT 133*    Radiological Exams on Admission: CT Thoracic Spine Wo Contrast  Addendum Date: 04/03/2022   ADDENDUM REPORT: 04/03/2022 05:12 ADDENDUM: Study discussed by telephone with Dr. Shanon Rosser on 04/03/2022 at 0446 hours. Also, correction the 3rd sentence in Impression #1 should read: "But aside from MILD DISTRACTION of the L1 vertebral body fragments, the L1 posterior elements are nondisplaced, and no significant spondylolisthesis." Electronically Signed   By: Genevie Ann M.D.   On: 04/03/2022 05:12   Result Date: 04/03/2022 CLINICAL DATA:  88 year old female status post fall. EXAM: CT THORACIC SPINE WITHOUT CONTRAST TECHNIQUE: Multidetector CT images of the thoracic were obtained using the standard protocol without intravenous contrast. RADIATION DOSE REDUCTION: This exam was performed according to the departmental dose-optimization program which includes automated exposure control, adjustment of the mA and/or kV according to patient size and/or use of iterative reconstruction technique. COMPARISON:  Cervical spine CT today. Thoracic MRI 03/02/2013. CT Chest, Abdomen, and Pelvis today are reported separately. 10/13/2021. FINDINGS: Limited cervical spine imaging:  Reported separately today. Thoracic spine segmentation:  Normal. Alignment: Chronic posttraumatic appearing abnormally increased kyphosis in the lower thoracic spine T10 through T12 appear stable since last year. Straightening of thoracic kyphosis elsewhere. Vertebrae: Diffuse osteopenia. Chronic T10, T11, and T12 vertebral body fractures. T10 vertebral plana, and chronically augmented T11 and T12 levels. Evidence of some interbody ankylosis at those levels through the T12-L1 disc space. The T1 through T9 thoracic levels appear stable and intact compared to last year. No acute posterior rib fracture identified. However, there is an acute horizontal fracture of L1 with chance fracture morphology, horizontal fracture plane  tracking into the bilateral L1 pedicles and posterior elements (on the left series 7, image 36). At the anterior L1 body of the fracture plane is oblique and diagonal. And there is mild distraction through the vertebral body fracture up to 5 mm. No significant spondylolisthesis. Posterior elements remain nondisplaced. Associated nondisplaced fracture of the right L1 transverse process. Furthermore, there is also a fracture of the anterior superior L2 vertebral body corner on series 7, image 23. And the anterior L1-L2 disc space is likely disrupted, mildly distracted. The L1-L2 facet joints also appear mildly distracted and might be disrupted. Paraspinal  and other soft tissues: Mild respiratory motion in the chest. Lung base atelectasis has not significantly changed from last year. No pericardial or pleural effusion. Calcified aortic atherosclerosis. L1 and L2 level mild paraspinal muscle edema and/or contusion. Thoracic paraspinal soft tissues remain within normal limits. Visible abdominal viscera appear stable from last year. Disc levels: Thoracic disc spaces are stable from last year including some chronic retropulsion of T10 and T12. Distracted L1-L2 disc space and facets in association with L1 and L2 vertebral fractures, but no significant retropulsion or spondylolisthesis. IMPRESSION: 1. Positive for unstable Chance fracture of L1, with evidence of disrupted L1-L2 disc and associated small L2 anterior superior corner fracture. Possible disruption of the L1-L2 facet joints also. But aside from of the L1 vertebral body fragments, the L1 posterior elements are nondisplaced, no significant spondylolisthesis. Recommend spine surgery consultation and completion of lumbar imaging with dedicated Lumbar CT. 2. 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 No acute traumatic injury identified in the thoracic spine 3.  Aortic Atherosclerosis  (ICD10-I70.0). Electronically Signed: By: Genevie Ann M.D. On: 04/03/2022 04:46   CT Cervical Spine Wo Contrast  Result Date: 04/03/2022 CLINICAL DATA:  88 year old female status post fall. EXAM: CT CERVICAL SPINE WITHOUT CONTRAST TECHNIQUE: Multidetector CT imaging of the cervical spine was performed without intravenous contrast. Multiplanar CT image reconstructions were also generated. RADIATION DOSE REDUCTION: This exam was performed according to the departmental dose-optimization program which includes automated exposure control, adjustment of the mA and/or kV according to patient size and/or use of iterative reconstruction technique. COMPARISON:  Head CT today.  Cervical spine CT 10/13/2021 FINDINGS: Alignment: Stable. Chronic straightening of lordosis, occasional mild degenerative anterolisthesis (C6-C7). Cervicothoracic junction alignment is within normal limits. Bilateral posterior element alignment is within normal limits. Skull base and vertebrae: Visualized skull base is intact. No atlanto-occipital dissociation. C1 and C2 appear intact and aligned. No acute osseous abnormality identified. Soft tissues and spinal canal: No prevertebral fluid or swelling. No visible canal hematoma. Stable and negative for age neck soft tissues. Disc levels: Stable generally mild for age cervical spine degeneration. Disc and endplate degeneration at C5-C6 with suspected mild spinal stenosis. Upper chest: Negative. IMPRESSION: 1. No acute traumatic injury identified in the cervical spine. 2. Stable generally mild for age cervical spine degeneration. Chronic C5-C6 degeneration and mild spinal stenosis. Electronically Signed   By: Genevie Ann M.D.   On: 04/03/2022 04:35   CT Head Wo Contrast  Result Date: 04/03/2022 CLINICAL DATA:  88 year old female status post fall. EXAM: CT HEAD WITHOUT CONTRAST TECHNIQUE: Contiguous axial images were obtained from the base of the skull through the vertex without intravenous contrast.  RADIATION DOSE REDUCTION: This exam was performed according to the departmental dose-optimization program which includes automated exposure control, adjustment of the mA and/or kV according to patient size and/or use of iterative reconstruction technique. COMPARISON:  Head CT 10/13/2021. FINDINGS: Brain: Stable and normal for age cerebral volume. No midline shift, ventriculomegaly, mass effect, evidence of mass lesion, intracranial hemorrhage or evidence of cortically based acute infarction. Stable gray-white differentiation, minimal to mild for age chronic white matter changes. Vascular: Calcified atherosclerosis at the skull base. No suspicious intracranial vascular hyperdensity. Skull: No acute osseous abnormality identified. Sinuses/Orbits: Visualized paranasal sinuses and mastoids are stable and well aerated. Other: Stable orbit and scalp soft tissues. IMPRESSION: 1. No acute traumatic injury identified. 2. Stable and negative for age noncontrast CT appearance of the brain. Electronically  Signed   By: Genevie Ann M.D.   On: 04/03/2022 04:32    Assessment/Plan Present on Admission:  Unstable Lumbar vertebral fracture (HCC)/Mechanical fall in shower -Transfer patient to [facility] -Neurosurgery consulted by EDP, aware.  Brace vs surgical option.  N.p.o., IV fluid hydration -Strict bedrest.  Purewick in place -Pain meds as needed -Patient's son updated via phone -PT consult per NS -CT head, C-spine and T-spine without acute pathologies   Hypertension - Norvasc resumed.  As needed blood pressure medications   Hypothyroidism -Synthroid 100 mcg daily resumed  Dementia, mild -Donezepil resumed   Anxiety and depression -BuSpar resumed -Patient Ativan not resumed.  She was taking only 0.5 mg daily -Trazodone QHS, has also been held  [place], [place] 04/03/2022, 5:54 AM

## 2022-04-03 NOTE — ED Notes (Signed)
Pts oxygen on room air is fluctuating between 90-93%.

## 2022-04-03 NOTE — ED Notes (Signed)
Patient transported to CT 

## 2022-04-03 NOTE — ED Triage Notes (Signed)
Pt BIBA from [facility] for fall Saturday night. Hit back on side of tub, also hit head, thinks there may have been LOC after the fall but does remember fall. Endorses pain behind right ear and whole back. Facility gave tylenol last night.   144/84 HR 66 88% RA 95% 4L [place]

## 2022-04-03 NOTE — Plan of Care (Signed)
  Problem: Education: Goal: Knowledge of General Education information will improve Description Including pain rating scale, medication(s)/side effects and non-pharmacologic comfort measures Outcome: Progressing   

## 2022-04-03 NOTE — ED Provider Notes (Signed)
[facility] DEPT Provider Note: Georgena Spurling, MD, FACEP  CSN: [id number] MRN: [id number] ARRIVAL: 04/03/22 at [place]: [place] OF PRESENT ILLNESS  04/03/22 3:30 AM Erika Conley is a 88 y.o. female who slipped and fell in the shower 2 evenings ago.  She hit the right side of her occipital region when she fell.  She is having pain in her head, neck and mid back.  The pain has worsened since the fall and is now moderate to severe.  The pain in her lower thoracic region is severe.  The pain in her head and neck are much milder.  She did not lose consciousness.   Past Medical History:  Diagnosis Date   Anxiety    Arthritis    Depression    Falls frequently    Fibromyalgia    Fracture, clavicle    GERD (gastroesophageal reflux disease)    History of fractured vertebra    Hypertension    Hypothyroidism    Seasonal allergies    Thyroid disease    Vertigo    Vitamin A deficiency    Wrist fracture, right     Past Surgical History:  Procedure Laterality Date   ABDOMINAL HYSTERECTOMY     BACK SURGERY     CHOLECYSTECTOMY     ERCP N/A 10/22/2015   Procedure: ENDOSCOPIC RETROGRADE CHOLANGIOPANCREATOGRAPHY (ERCP);  Surgeon: Milus Banister, MD;  Location: [facility];  Service: Endoscopy;  Laterality: N/A;   ERCP N/A 11/25/2015   Procedure: ENDOSCOPIC RETROGRADE CHOLANGIOPANCREATOGRAPHY (ERCP);  Surgeon: Milus Banister, MD;  Location: [facility];  Service: Endoscopy;  Laterality: N/A;   EUS N/A 11/25/2015   Procedure: ESOPHAGEAL ENDOSCOPIC ULTRASOUND (EUS) RADIAL;  Surgeon: Milus Banister, MD;  Location: [facility];  Service: Endoscopy;  Laterality: N/A;   FRACTURE SURGERY     THYROIDECTOMY     WRIST SURGERY      Family History  Problem Relation Age of Onset   Hypertension Other     Social History   Tobacco Use   Smoking status: Never   Smokeless tobacco: Never  Vaping Use   Vaping Use: Never used  Substance Use Topics    Alcohol use: No   Drug use: No    Prior to Admission medications   Medication Sig Start Date End Date Taking? Authorizing Provider  acetaminophen (TYLENOL) 325 MG tablet Take 650 mg by mouth every 4 (four) hours as needed for mild pain, fever or headache.    [provider]  amLODipine (NORVASC) 2.5 MG tablet Take 1 tablet (2.5 mg total) by mouth daily. Patient not taking: Reported on 10/13/2021 02/14/21   Lacretia Leigh, MD  amLODipine (NORVASC) 5 MG tablet Take 5 mg by mouth in the morning.    [provider]  Biotin 5000 MCG CAPS Take 5,000 mcg by mouth in the morning.    [provider]  busPIRone (BUSPAR) 30 MG tablet Take 30 mg by mouth in the morning and at bedtime.    [provider]  calcium carbonate (TUMS - DOSED IN MG ELEMENTAL CALCIUM) 500 MG chewable tablet Chew 2-3 tablets by mouth 3 (three) times daily with meals as needed (for GERD).    [provider]  Carboxymethylcellul-Glycerin (REFRESH OPTIVE PF OP) Place 1 drop into both eyes every 4 (four) hours as needed (for dryness).    [provider]  cetirizine (ZYRTEC) 10 MG tablet Take 10 mg by mouth daily.  [provider]  diclofenac Sodium (VOLTAREN) 1 % GEL Apply 2 g topically every 6 (six) hours as needed (for bilateral knees or back (PAIN)).    [provider]  dicyclomine (BENTYL) 20 MG tablet Take 20 mg by mouth 3 (three) times daily.    [provider]  donepezil (ARICEPT) 10 MG tablet Take 10 mg by mouth at bedtime.    [provider]  famotidine (PEPCID) 20 MG tablet Take 20 mg by mouth 2 (two) times daily.    [provider]  fluticasone (FLONASE) 50 MCG/ACT nasal spray Place 2 sprays into both nostrils daily.    [provider]  HYDROcodone-acetaminophen (NORCO/VICODIN) 5-325 MG tablet Take 1-2 tablets by mouth every 4 (four) hours as needed. Patient not taking: Reported on 10/13/2021 02/14/21   Lacretia Leigh,  MD  hydrOXYzine (VISTARIL) 25 MG capsule Take 25 mg by mouth every 8 (eight) hours as needed for anxiety.    [provider]  ibuprofen (ADVIL) 800 MG tablet Take 800 mg by mouth every 8 (eight) hours as needed (for rib pain).    [provider]  levothyroxine (SYNTHROID) 100 MCG tablet Take 100 mcg by mouth daily before breakfast.    [provider]  loperamide (IMODIUM A-D) 2 MG tablet Take 4 mg by mouth See admin instructions. Take 4 mg by mouth after 1st loose stool movement, as directed    [provider]  LORazepam (ATIVAN) 0.5 MG tablet Take 0.5 mg by mouth every evening.     [provider]  meclizine (ANTIVERT) 25 MG tablet Take 25 mg by mouth 3 (three) times daily as needed for dizziness.    [provider]  Multiple Vitamins-Minerals (PRESERVISION AREDS 2) CAPS Take 1 capsule by mouth in the morning and at bedtime.    [provider]  omeprazole (PRILOSEC) 40 MG capsule Take 1 capsule (40 mg total) by mouth 2 (two) times daily. Take shortly before breakfast and dinner meal Patient taking differently: Take 40 mg by mouth See admin instructions. Take 40 mg by mouth before breakfast and evening meal 07/16/18   Milus Banister, MD  sertraline (ZOLOFT) 100 MG tablet Take 200 mg by mouth in the morning.    [provider]  traZODone (DESYREL) 100 MG tablet Take 1.5 tablets (150 mg total) by mouth at bedtime. 12/12/18   Nicole Kindred A, DO  VAGISIL 1 % CREA Apply 1 Application topically 4 (four) times daily as needed (for itching).    [provider]    Allergies Codeine, Cortisone, Morphine, Nsaids, and Other   REVIEW OF SYSTEMS  Negative except as noted here or in the History of Present Illness.   PHYSICAL EXAMINATION  Initial Vital Signs Blood pressure (!) 161/66, pulse 65, temperature 99.6 F (37.6 C), temperature source Oral, resp. rate 18, SpO2 92 %.  Examination General: Well-developed,  well-nourished female in no acute distress; appearance consistent with age of record HENT: normocephalic Eyes: pupils equal, round and reactive to light; extraocular muscles intact Neck: supple: Mild C-spine tenderness Heart: regular rate and rhythm Lungs: clear to auscultation bilaterally Abdomen: soft; nondistended; nontender; bowel sounds present Back: Lower T-spine tenderness; no lumbar tenderness Extremities: No deformity; full range of motion; trace edema of lower legs Neurologic: Awake, alert and oriented; motor function intact in all extremities and symmetric; no facial droop Skin: Warm and dry Psychiatric: Normal mood and affect   RESULTS  Summary of this visit's results, reviewed and interpreted by  myself:   EKG Interpretation  Date/Time:  Monday April 03 2022 05:17:58 EST Ventricular Rate:  64 PR Interval:  210 QRS Duration: 89 QT Interval:  476 QTC Calculation: 492 R Axis:   -3 Text Interpretation: Sinus rhythm Atrial premature complex Borderline prolonged QT interval No significant change was found Confirmed by Shanon Rosser [phone]) on 04/03/2022 5:22:03 AM       Laboratory Studies: Results for orders placed or performed during the hospital encounter of 04/03/22 (from the past 24 hour(s))  CBC with Differential     Status: Abnormal   Collection Time: 04/03/22  5:16 AM  Result Value Ref Range   WBC 8.3 4.0 - 10.5 K/uL   RBC 4.82 3.87 - 5.11 MIL/uL   Hemoglobin 14.2 12.0 - 15.0 g/dL   HCT 41.7 36.0 - 46.0 %   MCV 86.5 80.0 - 100.0 fL   MCH 29.5 26.0 - 34.0 pg   MCHC 34.1 30.0 - 36.0 g/dL   RDW 13.4 11.5 - 15.5 %   Platelets 133 (L) 150 - 400 K/uL   nRBC 0.0 0.0 - 0.2 %   Neutrophils Relative % 83 %   Neutro Abs 6.9 1.7 - 7.7 K/uL   Lymphocytes Relative 9 %   Lymphs Abs 0.7 0.7 - 4.0 K/uL   Monocytes Relative 8 %   Monocytes Absolute 0.6 0.1 - 1.0 K/uL   Eosinophils Relative 0 %   Eosinophils Absolute 0.0 0.0 - 0.5 K/uL   Basophils Relative 0 %    Basophils Absolute 0.0 0.0 - 0.1 K/uL   Immature Granulocytes 0 %   Abs Immature Granulocytes 0.03 0.00 - 0.07 K/uL   Imaging Studies: CT Thoracic Spine Wo Contrast  Addendum Date: 04/03/2022   ADDENDUM REPORT: 04/03/2022 05:12 ADDENDUM: Study discussed by telephone with Dr. Shanon Rosser on 04/03/2022 at 0446 hours. Also, correction the 3rd sentence in Impression #1 should read: "But aside from MILD DISTRACTION of the L1 vertebral body fragments, the L1 posterior elements are nondisplaced, and no significant spondylolisthesis." Electronically Signed   By: Genevie Ann M.D.   On: 04/03/2022 05:12   Result Date: 04/03/2022 CLINICAL DATA:  88 year old female status post fall. EXAM: CT THORACIC SPINE WITHOUT CONTRAST TECHNIQUE: Multidetector CT images of the thoracic were obtained using the standard protocol without intravenous contrast. RADIATION DOSE REDUCTION: This exam was performed according to the departmental dose-optimization program which includes automated exposure control, adjustment of the mA and/or kV according to patient size and/or use of iterative reconstruction technique. COMPARISON:  Cervical spine CT today. Thoracic MRI 03/02/2013. CT Chest, Abdomen, and Pelvis today are reported separately. 10/13/2021. FINDINGS: Limited cervical spine imaging:  Reported separately today. Thoracic spine segmentation:  Normal. Alignment: Chronic posttraumatic appearing abnormally increased kyphosis in the lower thoracic spine T10 through T12 appear stable since last year. Straightening of thoracic kyphosis elsewhere. Vertebrae: Diffuse osteopenia. Chronic T10, T11, and T12 vertebral body fractures. T10 vertebral plana, and chronically augmented T11 and T12 levels. Evidence of some interbody ankylosis at those levels through the T12-L1 disc space. The T1 through T9 thoracic levels appear stable and intact compared to last year. No acute posterior rib fracture identified. However, there is an acute horizontal fracture  of L1 with chance fracture morphology, horizontal fracture plane tracking into the bilateral L1 pedicles and posterior elements (on the left series 7, image 36). At the anterior L1 body of the fracture plane is oblique and diagonal. And there is mild distraction through the vertebral body fracture up  to 5 mm. No significant spondylolisthesis. Posterior elements remain nondisplaced. Associated nondisplaced fracture of the right L1 transverse process. Furthermore, there is also a fracture of the anterior superior L2 vertebral body corner on series 7, image 23. And the anterior L1-L2 disc space is likely disrupted, mildly distracted. The L1-L2 facet joints also appear mildly distracted and might be disrupted. Paraspinal and other soft tissues: Mild respiratory motion in the chest. Lung base atelectasis has not significantly changed from last year. No pericardial or pleural effusion. Calcified aortic atherosclerosis. L1 and L2 level mild paraspinal muscle edema and/or contusion. Thoracic paraspinal soft tissues remain within normal limits. Visible abdominal viscera appear stable from last year. Disc levels: Thoracic disc spaces are stable from last year including some chronic retropulsion of T10 and T12. Distracted L1-L2 disc space and facets in association with L1 and L2 vertebral fractures, but no significant retropulsion or spondylolisthesis. IMPRESSION: 1. Positive for unstable Chance fracture of L1, with evidence of disrupted L1-L2 disc and associated small L2 anterior superior corner fracture. Possible disruption of the L1-L2 facet joints also. But aside from of the L1 vertebral body fragments, the L1 posterior elements are nondisplaced, no significant spondylolisthesis. Recommend spine surgery consultation and completion of lumbar imaging with dedicated Lumbar CT. 2. 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 No acute traumatic  injury identified in the thoracic spine 3.  Aortic Atherosclerosis (ICD10-I70.0). Electronically Signed: By: Genevie Ann M.D. On: 04/03/2022 04:46   CT Cervical Spine Wo Contrast  Result Date: 04/03/2022 CLINICAL DATA:  88 year old female status post fall. EXAM: CT CERVICAL SPINE WITHOUT CONTRAST TECHNIQUE: Multidetector CT imaging of the cervical spine was performed without intravenous contrast. Multiplanar CT image reconstructions were also generated. RADIATION DOSE REDUCTION: This exam was performed according to the departmental dose-optimization program which includes automated exposure control, adjustment of the mA and/or kV according to patient size and/or use of iterative reconstruction technique. COMPARISON:  Head CT today.  Cervical spine CT 10/13/2021 FINDINGS: Alignment: Stable. Chronic straightening of lordosis, occasional mild degenerative anterolisthesis (C6-C7). Cervicothoracic junction alignment is within normal limits. Bilateral posterior element alignment is within normal limits. Skull base and vertebrae: Visualized skull base is intact. No atlanto-occipital dissociation. C1 and C2 appear intact and aligned. No acute osseous abnormality identified. Soft tissues and spinal canal: No prevertebral fluid or swelling. No visible canal hematoma. Stable and negative for age neck soft tissues. Disc levels: Stable generally mild for age cervical spine degeneration. Disc and endplate degeneration at C5-C6 with suspected mild spinal stenosis. Upper chest: Negative. IMPRESSION: 1. No acute traumatic injury identified in the cervical spine. 2. Stable generally mild for age cervical spine degeneration. Chronic C5-C6 degeneration and mild spinal stenosis. Electronically Signed   By: Genevie Ann M.D.   On: 04/03/2022 04:35   CT Head Wo Contrast  Result Date: 04/03/2022 CLINICAL DATA:  88 year old female status post fall. EXAM: CT HEAD WITHOUT CONTRAST TECHNIQUE: Contiguous axial images were obtained from the base  of the skull through the vertex without intravenous contrast. RADIATION DOSE REDUCTION: This exam was performed according to the departmental dose-optimization program which includes automated exposure control, adjustment of the mA and/or kV according to patient size and/or use of iterative reconstruction technique. COMPARISON:  Head CT 10/13/2021. FINDINGS: Brain: Stable and normal for age cerebral volume. No midline shift, ventriculomegaly, mass effect, evidence of mass lesion, intracranial hemorrhage or evidence of cortically based acute infarction. Stable gray-white differentiation, minimal to mild  for age chronic white matter changes. Vascular: Calcified atherosclerosis at the skull base. No suspicious intracranial vascular hyperdensity. Skull: No acute osseous abnormality identified. Sinuses/Orbits: Visualized paranasal sinuses and mastoids are stable and well aerated. Other: Stable orbit and scalp soft tissues. IMPRESSION: 1. No acute traumatic injury identified. 2. Stable and negative for age noncontrast CT appearance of the brain. Electronically Signed   By: Genevie Ann M.D.   On: 04/03/2022 04:32    ED COURSE and MDM  Nursing notes, initial and subsequent vitals signs, including pulse oximetry, reviewed and interpreted by myself.  Vitals:   04/03/22 0327 04/03/22 0519  BP: (!) 161/66 (!) 175/68  Pulse: 65 68  Resp: 18 15  Temp: 99.6 F (37.6 C)   TempSrc: Oral   SpO2: 92% 96%   Medications  0.9 %  sodium chloride infusion (has no administration in time range)  fentaNYL (SUBLIMAZE) injection 25 mcg (has no administration in time range)  ondansetron (ZOFRAN) injection 4 mg (4 mg Intravenous Given 04/03/22 0545)   5:47 AM Patient has potentially unstable Chance fracture of L1.  We will have her admitted to the hospitalist service at [facility], Dr. Claria Dice accepting.  We will also consult neurosurgery as she may need surgical intervention.   6:02 AM Dr. Earle Gell of neurosurgery  consulted.  I spoke with his assistant, Meygan Reinaldo Meeker.    PROCEDURES  Procedures CRITICAL CARE Performed by: Karen Chafe Braven Wolk Total critical care time: 30 minutes Critical care time was exclusive of separately billable procedures and treating other patients. Critical care was necessary to treat or prevent imminent or life-threatening deterioration. Critical care was time spent personally by me on the following activities: development of treatment plan with patient and/or surrogate as well as nursing, discussions with consultants, evaluation of patient's response to treatment, examination of patient, obtaining history from patient or surrogate, ordering and performing treatments and interventions, ordering and review of laboratory studies, ordering and review of radiographic studies, pulse oximetry and re-evaluation of patient's condition.   ED DIAGNOSES     ICD-10-CM   1. Other closed fracture of first lumbar vertebra, initial encounter ([place])  S32.018A     2. Fall in shower  W18.2XXA     3. Closed compression fracture of L2 lumbar vertebra, initial encounter ([place])  S32.020A          Shanon Rosser, MD 04/03/22 [phone]

## 2022-04-04 ENCOUNTER — Inpatient Hospital Stay (HOSPITAL_COMMUNITY): Payer: Medicare Other

## 2022-04-04 DIAGNOSIS — R55 Syncope and collapse: Secondary | ICD-10-CM

## 2022-04-04 DIAGNOSIS — S32012D Unstable burst fracture of first lumbar vertebra, subsequent encounter for fracture with routine healing: Secondary | ICD-10-CM | POA: Diagnosis not present

## 2022-04-04 DIAGNOSIS — I10 Essential (primary) hypertension: Secondary | ICD-10-CM | POA: Diagnosis not present

## 2022-04-04 DIAGNOSIS — F419 Anxiety disorder, unspecified: Secondary | ICD-10-CM | POA: Diagnosis not present

## 2022-04-04 DIAGNOSIS — F0390 Unspecified dementia without behavioral disturbance: Secondary | ICD-10-CM

## 2022-04-04 DIAGNOSIS — F32A Depression, unspecified: Secondary | ICD-10-CM

## 2022-04-04 LAB — BASIC METABOLIC PANEL
Anion gap: 8 (ref 5–15)
BUN: 8 mg/dL (ref 8–23)
CO2: 29 mmol/L (ref 22–32)
Calcium: 8.4 mg/dL — ABNORMAL LOW (ref 8.9–10.3)
Chloride: 99 mmol/L (ref 98–111)
Creatinine, Ser: 0.67 mg/dL (ref 0.44–1.00)
GFR, Estimated: >60 mL/min (ref >60)
Glucose, Bld: 101 mg/dL — ABNORMAL HIGH (ref 70–99)
Potassium: 3.6 mmol/L (ref 3.5–5.1)
Sodium: 136 mmol/L (ref 135–145)

## 2022-04-04 LAB — CBC WITH DIFFERENTIAL/PLATELET
Abs Immature Granulocytes: 0.02 10*3/uL (ref 0.00–0.07)
Basophils Absolute: 0 10*3/uL (ref 0.0–0.1)
Basophils Relative: 1 %
Eosinophils Absolute: 0.1 10*3/uL (ref 0.0–0.5)
Eosinophils Relative: 1 %
HCT: 38.3 % (ref 36.0–46.0)
Hemoglobin: 13.1 g/dL (ref 12.0–15.0)
Immature Granulocytes: 0 %
Lymphocytes Relative: 23 %
Lymphs Abs: 1.4 10*3/uL (ref 0.7–4.0)
MCH: 29.6 pg (ref 26.0–34.0)
MCHC: 34.2 g/dL (ref 30.0–36.0)
MCV: 86.5 fL (ref 80.0–100.0)
Monocytes Absolute: 0.7 10*3/uL (ref 0.1–1.0)
Monocytes Relative: 11 %
Neutro Abs: 3.8 10*3/uL (ref 1.7–7.7)
Neutrophils Relative %: 64 %
Platelets: 119 10*3/uL — ABNORMAL LOW (ref 150–400)
RBC: 4.43 MIL/uL (ref 3.87–5.11)
RDW: 13.2 % (ref 11.5–15.5)
WBC: 5.9 10*3/uL (ref 4.0–10.5)
nRBC: 0 % (ref 0.0–0.2)

## 2022-04-04 LAB — PROTIME-INR
INR: 1.1 (ref 0.8–1.2)
Prothrombin Time: 14.5 seconds (ref 11.4–15.2)

## 2022-04-04 LAB — ECHOCARDIOGRAM COMPLETE
Area-P 1/2: 5.42 cm2
Calc EF: 58.4 %
S' Lateral: 2.3 cm
Single Plane A2C EF: 62.9 %
Single Plane A4C EF: 55.5 %

## 2022-04-04 LAB — MAGNESIUM: Magnesium: 1.9 mg/dL (ref 1.7–2.4)

## 2022-04-04 MED ORDER — AMLODIPINE BESYLATE 10 MG PO TABS
10.0000 mg | ORAL_TABLET | Freq: Every day | ORAL | Status: DC
  Administered 2022-04-05 – 2022-04-18 (×14): 10 mg via ORAL
  Filled 2022-04-04 (×14): qty 1

## 2022-04-04 MED ORDER — OYSTER SHELL CALCIUM/D3 500-5 MG-MCG PO TABS
2.0000 | ORAL_TABLET | Freq: Two times a day (BID) | ORAL | Status: DC
  Administered 2022-04-04 – 2022-04-18 (×29): 2 via ORAL
  Filled 2022-04-04 (×29): qty 2

## 2022-04-04 MED ORDER — SENNOSIDES-DOCUSATE SODIUM 8.6-50 MG PO TABS
2.0000 | ORAL_TABLET | Freq: Two times a day (BID) | ORAL | Status: DC
  Administered 2022-04-04 – 2022-04-18 (×20): 2 via ORAL
  Filled 2022-04-04 (×23): qty 2

## 2022-04-04 MED ORDER — AMLODIPINE BESYLATE 5 MG PO TABS
5.0000 mg | ORAL_TABLET | Freq: Once | ORAL | Status: AC
  Administered 2022-04-04: 5 mg via ORAL
  Filled 2022-04-04: qty 1

## 2022-04-04 NOTE — Progress Notes (Signed)
Echocardiogram 2D Echocardiogram has been performed.  Frances Furbish 04/04/2022, 10:38 AM

## 2022-04-04 NOTE — Progress Notes (Signed)
PROGRESS NOTE    Erika Conley  [id number] DOB: 1934/12/18 DOA: 04/03/2022 PCP: Merryl Hacker, No   Chief Complaint  Patient presents with   Fall    Brief Narrative:   This is a 88 year old female with past medical history of anxiety and depression, hypertension, hypothyroidism, fibromyalgia.  She resides at an independent living facility.  At baseline patient uses a cane.  Two nights ago she fell in the bathroom.  She lost her balance and fell into the shower with her walker on top of her. It was an unwitnessed fall but she was able to get immediate help. She did hit the right side of her head and briefly lost consciousness.  She hit/twisted her back and has had significant lower back pain.  She has been mostly in bed since secondary her lower back pain.  She has had a headache since but she denies confusion or any vision changes.  Patient has a history of AMD.  Pain made her come to the ER.  Her pain radiates down to both lower extremities.  She denies bladder or bowel incontinence.  She denies any confusion. -Her workup in ED was significant for unstable L1 compression fracture, she is admitted for further workup, transferred from [facility] to [facility] for neurosurgery evaluation.  Assessment & Plan:   Principal Problem:   Lumbar vertebral fracture (HCC) Active Problems:   Hypertension   Hypothyroidism   Anxiety and depression   HTN (hypertension)   Dementia without behavioral disturbance (HCC)   Traumatic compression fracture of L1 lumbar vertebra (HCC)    Unstable Lumbar vertebral fracture (HCC)/Mechanical fall in shower -Neurosurgery input greatly appreciated. -Continue with as needed pain medications. -Gust with neurosurgery, at this point will check upright lateral x-ray with her TLSO, and upon the findings then decision will be made for neurosurgery for surgical option(they would like to defer at this point given her osteoporosis and history of kyphoplasty in the past), versus  conservative management were to attempt to mobilize with TLSO and help to heal without intervention. -Start on vitamin D with calcium  Hypertension - Norvasc resumed.  As needed blood pressure medications   Hypothyroidism -Synthroid 100 mcg daily resumed   Dementia, mild -Donezepil resumed   Anxiety and depression -BuSpar and Ativan resumed    DVT prophylaxis: Lovenox Code Status: DNR Family Communication: none at bedside Disposition:   Status is: Inpatient    Consultants:  neurosurgery  Subjective:  No significant events overnight, she reports back pain and requesting some pain medications  Objective: Vitals:   04/03/22 2042 04/03/22 2309 04/04/22 0300 04/04/22 0831  BP: (!) 154/60 (!) 151/66 (!) 150/62 (!) 169/66  Pulse: 65 (!) 59 60 66  Resp: 18 16 16 16  $ Temp: 98.2 F (36.8 C) 98.5 F (36.9 C) 98.6 F (37 C) 98.2 F (36.8 C)  TempSrc: Oral Oral Oral Oral  SpO2: 94% 93% 96% 97%    Intake/Output Summary (Last 24 hours) at 04/04/2022 1148 Last data filed at 04/04/2022 [id number] Gross per 24 hour  Intake 401.82 ml  Output 600 ml  Net -198.18 ml   There were no vitals filed for this visit.  Examination:  Awake Alert, frail, deconditioned, wearing TLSO. Symmetrical Chest wall movement, Good air movement bilaterally, CTAB RRR,No Gallops,Rubs or new Murmurs, No Parasternal Heave +ve B.Sounds, Abd Soft, No tenderness, No rebound - guarding or rigidity. No Cyanosis, Clubbing or edema, No new Rash or bruise  , good movement and sensation in lower extremity.  Data Reviewed: I have personally reviewed following labs and imaging studies  CBC: Recent Labs  Lab 04/03/22 0516 04/04/22 0325  WBC 8.3 5.9  NEUTROABS 6.9 3.8  HGB 14.2 13.1  HCT 41.7 38.3  MCV 86.5 86.5  PLT 133* 119*    Basic Metabolic Panel: Recent Labs  Lab 04/03/22 0516 04/04/22 0325  NA 138 136  K 3.4* 3.6  CL 99 99  CO2 29 29  GLUCOSE 120* 101*  BUN 9 8  CREATININE 0.70 0.67   CALCIUM 8.7* 8.4*  MG  --  1.9    GFR: CrCl cannot be calculated (Unknown ideal weight.).  Liver Function Tests: No results for input(s): "AST", "ALT", "ALKPHOS", "BILITOT", "PROT", "ALBUMIN" in the last 168 hours.  CBG: No results for input(s): "GLUCAP" in the last 168 hours.   No results found for this or any previous visit (from the past 240 hour(s)).       Radiology Studies: MR LUMBAR SPINE WO CONTRAST  Result Date: 04/03/2022 CLINICAL DATA:  Spine fracture, lumbar, traumatic. EXAM: MRI LUMBAR SPINE WITHOUT CONTRAST TECHNIQUE: Multiplanar, multisequence MR imaging of the lumbar spine was performed. No intravenous contrast was administered. COMPARISON:  CT thoracic and lumbar spine 04/03/2022 FINDINGS: Segmentation:  Standard. Alignment: Mildly exaggerated lumbar lordosis. Mild upper lumbar levoscoliosis. Trace retrolisthesis of L2 on L3. Increased lower thoracic kyphosis. Vertebrae: Chronic, severe T10 compression fracture and previously augmented T11 and T12 compression fractures. Hemangioma in the L1 vertebral body. Acute oblique/horizontal fracture of the L1 vertebral body with disruption of the anterior and posterior vertebral cortex and superior endplate and with approximately 6 mm distraction as shown on CT. The fracture is again noted to extend through the L1 posterior elements without significant distraction. Anterior widening of the L1-2 disc space is less than on today's earlier CT, however abnormal STIR hyperintensity/fluid signal within the disc is consistent with acute disc injury. The L1-2 facet joints are not frankly distracted by MRI and contain at most trace joint fluid. There is mild marrow edema along the L2 superior endplate corresponding to the anterosuperior vertebral corner fracture shown on CT. No significant marrow edema is evident in the L3 vertebral body to correspond to the subtle fracture questioned on CT, and there are no definite findings of acute L2-3 disc  disruption by MRI. Conus medullaris and cauda equina: Conus extends to the L1-2 level. Conus and cauda equina appear normal. Paraspinal and other soft tissues: Mild edema in the posterior paraspinal soft tissues at L1 which may reflect in part a degree of posterior ligamentous sprain/injury. Disc levels: Mild spinal stenosis at T10-11 and T11-12 due to retropulsion of chronic vertebral compression fractures, disc bulging, and posterior element hypertrophy. Mild spinal stenosis at L1-2, L2-3, and L3-4 due to disc bulging and posterior element hypertrophy. Prior posterior decompression at L4-5 with widely patent spinal canal and moderate right and mild left neural foraminal stenosis. IMPRESSION: 1. Acute L1 Chance fracture with L1-2 disc injury. 2. Acute L2 superior endplate fracture. 3. Mild multilevel spinal stenosis as above. Electronically Signed   By: Logan Bores M.D.   On: 04/03/2022 10:13   CT Lumbar Spine Wo Contrast  Result Date: 04/03/2022 CLINICAL DATA:  88 year old female status post fall with L1 Chance fracture, L2 anterior superior endplate fracture on thoracic CT today. EXAM: CT LUMBAR SPINE WITHOUT CONTRAST TECHNIQUE: Multidetector CT imaging of the lumbar spine was performed without intravenous contrast administration. Multiplanar CT image reconstructions were also generated. RADIATION DOSE REDUCTION: This exam was  performed according to the departmental dose-optimization program which includes automated exposure control, adjustment of the mA and/or kV according to patient size and/or use of iterative reconstruction technique. COMPARISON:  Thoracic spine CT 0356 hours today. Previous lumbar MRI 11/11/2010. FINDINGS: Segmentation: Normal, concordant with the thoracic spine numbering today. Alignment: Stable thoracolumbar junction alignment from the 0356 hour exam. Chronic T10 through T12 kyphotic deformity foam multilevel compression fractures, 2 of those levels previously augmented. Relatively  preserved lumbar lordosis below L2. Vertebrae: Diffuse osteopenia. Chronic lower thoracic compression fractures and kyphotic deformity with ankylosis as detailed on the thoracic study earlier today. Superimposed comminuted L1 Chance fracture, distracted anterior L1-L2 disc space, L2 anterior superior corner fracture, and mildly widened bilateral L1-L2 facets redemonstrated and stable from earlier today. This includes a nondisplaced right L1 transverse process fracture. Questionable abnormal widening also of the L2-L3 anterior disc space. Trace vacuum disc there. And there does appear to be a minimally displaced fracture of the left lateral anterior L2 corner on both series 6, image 47 and series 7, image 20. And there does appear to be a similar subtle fracture of the anterior right superior L3 endplate also on image 47. But the L2-L3 and other lumbar facets remain aligned. L4, L5, and visible sacrum appear intact. Grossly intact SI joints. Paraspinal and other soft tissues: Chronic postoperative changes to the lower lumbar paraspinal soft tissues at L4-L5. Previous posterior decompression Calcified aortic atherosclerosis. Absent gallbladder. Negative other visible noncontrast abdominal viscera. Mild ventral L1 and L2 paraspinal edema/hematoma. Disc levels: No CT evidence of significant lumbar spinal stenosis despite E multilevel posttraumatic changes. Previously decompressed L4-L5 level. IMPRESSION: 1. Generalized Osteopenia with Acute: - L1 Chance fracture. Distracted vertebral body fragments but relatively nondisplaced posterior elements. - disrupted L1-L2 disc space with associated anterior superior L2 corner fracture, and probable facet disruption widening of the bilateral L1-L2 facet joints. - probably disrupted anterior L2-L3 disc space also, with subtle anterior L2 and L3 endplate corner fractures on the right. 2. No significant lumbar retropulsion or spinal stenosis by CT. Previous L4-L5 decompression. 3.  Chronic lower thoracic kyphotic deformity with prior augmentation and ankylosis as detailed on the thoracic CT today. Electronically Signed   By: Genevie Ann M.D.   On: 04/03/2022 06:27   CT Thoracic Spine Wo Contrast  Addendum Date: 04/03/2022   ADDENDUM REPORT: 04/03/2022 05:12 ADDENDUM: Study discussed by telephone with Dr. Shanon Rosser on 04/03/2022 at 0446 hours. Also, correction the 3rd sentence in Impression #1 should read: "But aside from MILD DISTRACTION of the L1 vertebral body fragments, the L1 posterior elements are nondisplaced, and no significant spondylolisthesis." Electronically Signed   By: Genevie Ann M.D.   On: 04/03/2022 05:12   Result Date: 04/03/2022 CLINICAL DATA:  88 year old female status post fall. EXAM: CT THORACIC SPINE WITHOUT CONTRAST TECHNIQUE: Multidetector CT images of the thoracic were obtained using the standard protocol without intravenous contrast. RADIATION DOSE REDUCTION: This exam was performed according to the departmental dose-optimization program which includes automated exposure control, adjustment of the mA and/or kV according to patient size and/or use of iterative reconstruction technique. COMPARISON:  Cervical spine CT today. Thoracic MRI 03/02/2013. CT Chest, Abdomen, and Pelvis today are reported separately. 10/13/2021. FINDINGS: Limited cervical spine imaging:  Reported separately today. Thoracic spine segmentation:  Normal. Alignment: Chronic posttraumatic appearing abnormally increased kyphosis in the lower thoracic spine T10 through T12 appear stable since last year. Straightening of thoracic kyphosis elsewhere. Vertebrae: Diffuse osteopenia. Chronic T10, T11, and T12 vertebral  body fractures. T10 vertebral plana, and chronically augmented T11 and T12 levels. Evidence of some interbody ankylosis at those levels through the T12-L1 disc space. The T1 through T9 thoracic levels appear stable and intact compared to last year. No acute posterior rib fracture identified.  However, there is an acute horizontal fracture of L1 with chance fracture morphology, horizontal fracture plane tracking into the bilateral L1 pedicles and posterior elements (on the left series 7, image 36). At the anterior L1 body of the fracture plane is oblique and diagonal. And there is mild distraction through the vertebral body fracture up to 5 mm. No significant spondylolisthesis. Posterior elements remain nondisplaced. Associated nondisplaced fracture of the right L1 transverse process. Furthermore, there is also a fracture of the anterior superior L2 vertebral body corner on series 7, image 23. And the anterior L1-L2 disc space is likely disrupted, mildly distracted. The L1-L2 facet joints also appear mildly distracted and might be disrupted. Paraspinal and other soft tissues: Mild respiratory motion in the chest. Lung base atelectasis has not significantly changed from last year. No pericardial or pleural effusion. Calcified aortic atherosclerosis. L1 and L2 level mild paraspinal muscle edema and/or contusion. Thoracic paraspinal soft tissues remain within normal limits. Visible abdominal viscera appear stable from last year. Disc levels: Thoracic disc spaces are stable from last year including some chronic retropulsion of T10 and T12. Distracted L1-L2 disc space and facets in association with L1 and L2 vertebral fractures, but no significant retropulsion or spondylolisthesis. IMPRESSION: 1. Positive for unstable Chance fracture of L1, with evidence of disrupted L1-L2 disc and associated small L2 anterior superior corner fracture. Possible disruption of the L1-L2 facet joints also. But aside from of the L1 vertebral body fragments, the L1 posterior elements are nondisplaced, no significant spondylolisthesis. Recommend spine surgery consultation and completion of lumbar imaging with dedicated Lumbar CT. 2. Chronic lower thoracic spine T10 through T12 fraction deformity with prior augmentation, exaggerated  kyphosis and ankylosis. No acute traumatic injury identified in the thoracic spine 3.  Aortic Atherosclerosis (ICD10-I70.0). Electronically Signed: By: Genevie Ann M.D. On: 04/03/2022 04:46   CT Cervical Spine Wo Contrast  Result Date: 04/03/2022 CLINICAL DATA:  88 year old female status post fall. EXAM: CT CERVICAL SPINE WITHOUT CONTRAST TECHNIQUE: Multidetector CT imaging of the cervical spine was performed without intravenous contrast. Multiplanar CT image reconstructions were also generated. RADIATION DOSE REDUCTION: This exam was performed according to the departmental dose-optimization program which includes automated exposure control, adjustment of the mA and/or kV according to patient size and/or use of iterative reconstruction technique. COMPARISON:  Head CT today.  Cervical spine CT 10/13/2021 FINDINGS: Alignment: Stable. Chronic straightening of lordosis, occasional mild degenerative anterolisthesis (C6-C7). Cervicothoracic junction alignment is within normal limits. Bilateral posterior element alignment is within normal limits. Skull base and vertebrae: Visualized skull base is intact. No atlanto-occipital dissociation. C1 and C2 appear intact and aligned. No acute osseous abnormality identified. Soft tissues and spinal canal: No prevertebral fluid or swelling. No visible canal hematoma. Stable and negative for age neck soft tissues. Disc levels: Stable generally mild for age cervical spine degeneration. Disc and endplate degeneration at C5-C6 with suspected mild spinal stenosis. Upper chest: Negative. IMPRESSION: 1. No acute traumatic injury identified in the cervical spine. 2. Stable generally mild for age cervical spine degeneration. Chronic C5-C6 degeneration and mild spinal stenosis. Electronically Signed   By: Genevie Ann M.D.   On: 04/03/2022 04:35   CT Head Wo Contrast  Result Date: 04/03/2022 CLINICAL DATA:  88 year old female status post fall. EXAM: CT HEAD WITHOUT CONTRAST TECHNIQUE: Contiguous  axial images were obtained from the base of the skull through the vertex without intravenous contrast. RADIATION DOSE REDUCTION: This exam was performed according to the departmental dose-optimization program which includes automated exposure control, adjustment of the mA and/or kV according to patient size and/or use of iterative reconstruction technique. COMPARISON:  Head CT 10/13/2021. FINDINGS: Brain: Stable and normal for age cerebral volume. No midline shift, ventriculomegaly, mass effect, evidence of mass lesion, intracranial hemorrhage or evidence of cortically based acute infarction. Stable gray-white differentiation, minimal to mild for age chronic white matter changes. Vascular: Calcified atherosclerosis at the skull base. No suspicious intracranial vascular hyperdensity. Skull: No acute osseous abnormality identified. Sinuses/Orbits: Visualized paranasal sinuses and mastoids are stable and well aerated. Other: Stable orbit and scalp soft tissues. IMPRESSION: 1. No acute traumatic injury identified. 2. Stable and negative for age noncontrast CT appearance of the brain. Electronically Signed   By: Genevie Ann M.D.   On: 04/03/2022 04:32        Scheduled Meds:  acetaminophen  1,000 mg Oral TID   amLODipine  5 mg Oral Daily   busPIRone  30 mg Oral BID   calcium-vitamin D  2 tablet Oral BID WC   donepezil  10 mg Oral QHS   enoxaparin (LOVENOX) injection  40 mg Subcutaneous Q24H   famotidine  20 mg Oral BID   levothyroxine  100 mcg Oral Q0600   lidocaine  1 patch Transdermal Daily   LORazepam  0.5 mg Oral QHS   methocarbamol  500 mg Oral TID   pantoprazole  40 mg Oral BID   polyethylene glycol  17 g Oral Daily   senna-docusate  2 tablet Oral BID   sertraline  200 mg Oral Daily   Continuous Infusions:   LOS: 1 day      Phillips Climes, MD [facility]   To contact the attending provider between 7A-7P or the covering provider during after hours 7P-7A, please log into the web  site [email or web address] and access using universal [place] password for that web site. If you do not have the password, please call the hospital operator.  04/04/2022, 11:48 AM

## 2022-04-04 NOTE — Consult Note (Signed)
Reason for Consult: Lumbar fracture Referring Physician: Dr. Spero Curb is an 88 y.o. female.  HPI: The patient is an 88 year old white female with a history of previous compression fractures status post vertebroplasty who lives in assisted living.  She took a fall suffering a L1 Chance fracture and minimal L2 compression fracture.  She was transferred to [facility].  A neurosurgical consultation was requested.  Presently the patient is alert and pleasant.  She says her pain is well-controlled with medications.  She denies radicular symptoms, numbness, tingling, weakness, etc.  Past Medical History:  Diagnosis Date   Anxiety    Arthritis    Depression    Falls frequently    Fibromyalgia    Fracture, clavicle    GERD (gastroesophageal reflux disease)    History of fractured vertebra    Hypertension    Hypothyroidism    Seasonal allergies    Thyroid disease    Vertigo    Vitamin A deficiency    Wrist fracture, right     Past Surgical History:  Procedure Laterality Date   ABDOMINAL HYSTERECTOMY     BACK SURGERY     CHOLECYSTECTOMY     ERCP N/A 10/22/2015   Procedure: ENDOSCOPIC RETROGRADE CHOLANGIOPANCREATOGRAPHY (ERCP);  Surgeon: Milus Banister, MD;  Location: [facility];  Service: Endoscopy;  Laterality: N/A;   ERCP N/A 11/25/2015   Procedure: ENDOSCOPIC RETROGRADE CHOLANGIOPANCREATOGRAPHY (ERCP);  Surgeon: Milus Banister, MD;  Location: [facility];  Service: Endoscopy;  Laterality: N/A;   EUS N/A 11/25/2015   Procedure: ESOPHAGEAL ENDOSCOPIC ULTRASOUND (EUS) RADIAL;  Surgeon: Milus Banister, MD;  Location: [facility];  Service: Endoscopy;  Laterality: N/A;   FRACTURE SURGERY     THYROIDECTOMY     WRIST SURGERY      Family History  Problem Relation Age of Onset   Hypertension Other     Social History:  reports that she has never smoked. She has never used smokeless tobacco. She reports that she does not drink alcohol and does not use  drugs.  Allergies:  Allergies  Allergen Reactions   Codeine Other (See Comments)    "Passes out and falls"   Cortisone Other (See Comments)    "Passes out and falls"   Morphine Other (See Comments)    Reaction (?)   Nsaids Other (See Comments)    Reaction (?)   Other Other (See Comments)    All narcotics cause patient to "pass out and fall"     Medications: I have reviewed the patient's current medications. Prior to Admission:  Medications Prior to Admission  Medication Sig Dispense Refill Last Dose   acetaminophen (TYLENOL) 325 MG tablet Take 650 mg by mouth every 4 (four) hours as needed for mild pain, fever or headache.   04/02/2022   amLODipine (NORVASC) 5 MG tablet Take 5 mg by mouth in the morning.   04/02/2022   amoxicillin (AMOXIL) 500 MG capsule Take 500 mg by mouth 2 (two) times daily. 10 DS   04/02/2022   Biotin 5000 MCG CAPS Take 5,000 mcg by mouth in the morning.   04/02/2022   busPIRone (BUSPAR) 30 MG tablet Take 30 mg by mouth in the morning and at bedtime.   04/02/2022   calcium carbonate (TUMS - DOSED IN MG ELEMENTAL CALCIUM) 500 MG chewable tablet Chew 2-3 tablets by mouth 3 (three) times daily with meals as needed (for GERD).   04/02/2022   Carboxymethylcellul-Glycerin (REFRESH OPTIVE PF OP) Place  1 drop into both eyes every 4 (four) hours as needed (for dryness).   unk   cetirizine (ZYRTEC) 10 MG tablet Take 10 mg by mouth daily.   04/02/2022   diclofenac Sodium (VOLTAREN) 1 % GEL Apply 2 g topically every 12 (twelve) hours as needed (for bilateral knees or back (PAIN)).   unk   dicyclomine (BENTYL) 20 MG tablet Take 20 mg by mouth 3 (three) times daily.   04/02/2022   donepezil (ARICEPT) 10 MG tablet Take 10 mg by mouth at bedtime.   04/02/2022   famotidine (PEPCID) 20 MG tablet Take 20 mg by mouth 2 (two) times daily.   04/02/2022   fluticasone (FLONASE) 50 MCG/ACT nasal spray Place 2 sprays into both nostrils daily.   04/02/2022   hydrOXYzine (VISTARIL) 25 MG capsule  Take 25 mg by mouth every 8 (eight) hours as needed for anxiety.   04/02/2022   ibuprofen (ADVIL) 800 MG tablet Take 800 mg by mouth every 8 (eight) hours as needed (for rib pain).   unk   levothyroxine (SYNTHROID) 100 MCG tablet Take 100 mcg by mouth daily before breakfast.   04/02/2022   loperamide (IMODIUM A-D) 2 MG tablet Take 4 mg by mouth See admin instructions. Take 4 mg by mouth after 1st loose stool movement, as directed   04/01/2022   LORazepam (ATIVAN) 0.5 MG tablet Take 0.5 mg by mouth every evening.    04/02/2022   meclizine (ANTIVERT) 25 MG tablet Take 25 mg by mouth 3 (three) times daily as needed for dizziness.   unk   Multiple Vitamins-Minerals (PRESERVISION AREDS 2) CAPS Take 1 capsule by mouth in the morning and at bedtime.   04/02/2022   omeprazole (PRILOSEC) 40 MG capsule Take 1 capsule (40 mg total) by mouth 2 (two) times daily. Take shortly before breakfast and dinner meal (Patient taking differently: Take 40 mg by mouth See admin instructions. Take 40 mg by mouth before breakfast and evening meal) 60 capsule 5 04/02/2022   sertraline (ZOLOFT) 100 MG tablet Take 200 mg by mouth in the morning.   04/02/2022   traZODone (DESYREL) 100 MG tablet Take 1.5 tablets (150 mg total) by mouth at bedtime. 30 tablet 1 04/02/2022   VAGISIL 1 % CREA Apply 1 Application topically 4 (four) times daily as needed (for itching).   unk   Scheduled:  acetaminophen  1,000 mg Oral TID   amLODipine  5 mg Oral Daily   busPIRone  30 mg Oral BID   calcium-vitamin D  2 tablet Oral BID WC   donepezil  10 mg Oral QHS   enoxaparin (LOVENOX) injection  40 mg Subcutaneous Q24H   famotidine  20 mg Oral BID   levothyroxine  100 mcg Oral Q0600   lidocaine  1 patch Transdermal Daily   LORazepam  0.5 mg Oral QHS   methocarbamol  500 mg Oral TID   pantoprazole  40 mg Oral BID   polyethylene glycol  17 g Oral Daily   senna-docusate  2 tablet Oral BID   sertraline  200 mg Oral Daily   Continuous:  lactated  ringers 50 mL/hr at 04/03/22 1758   [id number] (SUBLIMAZE) injection, hydrOXYzine, ondansetron **OR** ondansetron (ZOFRAN) IV, senna-docusate, traMADol Anti-infectives (From admission, onward)    None        Results for orders placed or performed during the hospital encounter of 04/03/22 (from the past 48 hour(s))  CBC with Differential     Status: Abnormal   Collection  Time: 04/03/22  5:16 AM  Result Value Ref Range   WBC 8.3 4.0 - 10.5 K/uL   RBC 4.82 3.87 - 5.11 MIL/uL   Hemoglobin 14.2 12.0 - 15.0 g/dL   HCT 41.7 36.0 - 46.0 %   MCV 86.5 80.0 - 100.0 fL   MCH 29.5 26.0 - 34.0 pg   MCHC 34.1 30.0 - 36.0 g/dL   RDW 13.4 11.5 - 15.5 %   Platelets 133 (L) 150 - 400 K/uL   nRBC 0.0 0.0 - 0.2 %   Neutrophils Relative % 83 %   Neutro Abs 6.9 1.7 - 7.7 K/uL   Lymphocytes Relative 9 %   Lymphs Abs 0.7 0.7 - 4.0 K/uL   Monocytes Relative 8 %   Monocytes Absolute 0.6 0.1 - 1.0 K/uL   Eosinophils Relative 0 %   Eosinophils Absolute 0.0 0.0 - 0.5 K/uL   Basophils Relative 0 %   Basophils Absolute 0.0 0.0 - 0.1 K/uL   Immature Granulocytes 0 %   Abs Immature Granulocytes 0.03 0.00 - 0.07 K/uL    Comment: Performed at [facility], [place] [address] [place], [place] [id number]  Basic metabolic panel     Status: Abnormal   Collection Time: 04/03/22  5:16 AM  Result Value Ref Range   Sodium 138 135 - 145 mmol/L   Potassium 3.4 (L) 3.5 - 5.1 mmol/L   Chloride 99 98 - 111 mmol/L   CO2 29 22 - 32 mmol/L   Glucose, Bld 120 (H) 70 - 99 mg/dL    Comment: Glucose reference range applies only to samples taken after fasting for at least 8 hours.   BUN 9 8 - 23 mg/dL   Creatinine, Ser 0.70 0.44 - 1.00 mg/dL   Calcium 8.7 (L) 8.9 - 10.3 mg/dL   GFR, Estimated >60 >60 mL/min    Comment: (NOTE) Calculated using the CKD-EPI Creatinine Equation (2021)    Anion gap 10 5 - 15    Comment: Performed at [facility], [place] [address], [place], [place]  [address]  Magnesium     Status: None   Collection Time: 04/04/22  3:25 AM  Result Value Ref Range   Magnesium 1.9 1.7 - 2.4 mg/dL    Comment: Performed at [facility], [place] [address] [place],  [address]  CBC with Differential/Platelet     Status: Abnormal   Collection Time: 04/04/22  3:25 AM  Result Value Ref Range   WBC 5.9 4.0 - 10.5 K/uL   RBC 4.43 3.87 - 5.11 MIL/uL   Hemoglobin 13.1 12.0 - 15.0 g/dL   HCT 38.3 36.0 - 46.0 %   MCV 86.5 80.0 - 100.0 fL   MCH 29.6 26.0 - 34.0 pg   MCHC 34.2 30.0 - 36.0 g/dL   RDW 13.2 11.5 - 15.5 %   Platelets 119 (L) 150 - 400 K/uL   nRBC 0.0 0.0 - 0.2 %   Neutrophils Relative % 64 %   Neutro Abs 3.8 1.7 - 7.7 K/uL   Lymphocytes Relative 23 %   Lymphs Abs 1.4 0.7 - 4.0 K/uL   Monocytes Relative 11 %   Monocytes Absolute 0.7 0.1 - 1.0 K/uL   Eosinophils Relative 1 %   Eosinophils Absolute 0.1 0.0 - 0.5 K/uL   Basophils Relative 1 %   Basophils Absolute 0.0 0.0 - 0.1 K/uL   Immature Granulocytes 0 %   Abs Immature Granulocytes 0.02 0.00 - 0.07 K/uL  Comment: Performed at [facility], [place] [address], [place], [place] [address]  Protime-INR     Status: None   Collection Time: 04/04/22  3:25 AM  Result Value Ref Range   Prothrombin Time 14.5 11.4 - 15.2 seconds   INR 1.1 0.8 - 1.2    Comment: (NOTE) INR goal varies based on device and disease states. Performed at [facility], [place] [address], [place], [place] [id number]   Basic metabolic panel     Status: Abnormal   Collection Time: 04/04/22  3:25 AM  Result Value Ref Range   Sodium 136 135 - 145 mmol/L   Potassium 3.6 3.5 - 5.1 mmol/L   Chloride 99 98 - 111 mmol/L   CO2 29 22 - 32 mmol/L   Glucose, Bld 101 (H) 70 - 99 mg/dL    Comment: Glucose reference range applies only to samples taken after fasting for at least 8 hours.   BUN 8 8 - 23 mg/dL   Creatinine, Ser 0.67 0.44 - 1.00 mg/dL   Calcium 8.4 (L) 8.9 - 10.3 mg/dL   GFR, Estimated >60 >60 mL/min     Comment: (NOTE) Calculated using the CKD-EPI Creatinine Equation (2021)    Anion gap 8 5 - 15    Comment: Performed at [facility] [address], [place], [place] [address]    MR LUMBAR SPINE WO CONTRAST  Result Date: 04/03/2022 CLINICAL DATA:  Spine fracture, lumbar, traumatic. EXAM: MRI LUMBAR SPINE WITHOUT CONTRAST TECHNIQUE: Multiplanar, multisequence MR imaging of the lumbar spine was performed. No intravenous contrast was administered. COMPARISON:  CT thoracic and lumbar spine 04/03/2022 FINDINGS: Segmentation:  Standard. Alignment: Mildly exaggerated lumbar lordosis. Mild upper lumbar levoscoliosis. Trace retrolisthesis of L2 on L3. Increased lower thoracic kyphosis. Vertebrae: Chronic, severe T10 compression fracture and previously augmented T11 and T12 compression fractures. Hemangioma in the L1 vertebral body. Acute oblique/horizontal fracture of the L1 vertebral body with disruption of the anterior and posterior vertebral cortex and superior endplate and with approximately 6 mm distraction as shown on CT. The fracture is again noted to extend through the L1 posterior elements without significant distraction. Anterior widening of the L1-2 disc space is less than on today's earlier CT, however abnormal STIR hyperintensity/fluid signal within the disc is consistent with acute disc injury. The L1-2 facet joints are not frankly distracted by MRI and contain at most trace joint fluid. There is mild marrow edema along the L2 superior endplate corresponding to the anterosuperior vertebral corner fracture shown on CT. No significant marrow edema is evident in the L3 vertebral body to correspond to the subtle fracture questioned on CT, and there are no definite findings of acute L2-3 disc disruption by MRI. Conus medullaris and cauda equina: Conus extends to the L1-2 level. Conus and cauda equina appear normal. Paraspinal and other soft tissues: Mild edema in the posterior paraspinal soft tissues  at L1 which may reflect in part a degree of posterior ligamentous sprain/injury. Disc levels: Mild spinal stenosis at T10-11 and T11-12 due to retropulsion of chronic vertebral compression fractures, disc bulging, and posterior element hypertrophy. Mild spinal stenosis at L1-2, L2-3, and L3-4 due to disc bulging and posterior element hypertrophy. Prior posterior decompression at L4-5 with widely patent spinal canal and moderate right and mild left neural foraminal stenosis. IMPRESSION: 1. Acute L1 Chance fracture with L1-2 disc injury. 2. Acute L2 superior endplate fracture. 3. Mild multilevel spinal stenosis as above. Electronically Signed   By:  Logan Bores M.D.   On: 04/03/2022 10:13   CT Lumbar Spine Wo Contrast  Result Date: 04/03/2022 CLINICAL DATA:  88 year old female status post fall with L1 Chance fracture, L2 anterior superior endplate fracture on thoracic CT today. EXAM: CT LUMBAR SPINE WITHOUT CONTRAST TECHNIQUE: Multidetector CT imaging of the lumbar spine was performed without intravenous contrast administration. Multiplanar CT image reconstructions were also generated. RADIATION DOSE REDUCTION: This exam was performed according to the departmental dose-optimization program which includes automated exposure control, adjustment of the mA and/or kV according to patient size and/or use of iterative reconstruction technique. COMPARISON:  Thoracic spine CT 0356 hours today. Previous lumbar MRI 11/11/2010. FINDINGS: Segmentation: Normal, concordant with the thoracic spine numbering today. Alignment: Stable thoracolumbar junction alignment from the 0356 hour exam. Chronic T10 through T12 kyphotic deformity foam multilevel compression fractures, 2 of those levels previously augmented. Relatively preserved lumbar lordosis below L2. Vertebrae: Diffuse osteopenia. Chronic lower thoracic compression fractures and kyphotic deformity with ankylosis as detailed on the thoracic study earlier today. Superimposed  comminuted L1 Chance fracture, distracted anterior L1-L2 disc space, L2 anterior superior corner fracture, and mildly widened bilateral L1-L2 facets redemonstrated and stable from earlier today. This includes a nondisplaced right L1 transverse process fracture. Questionable abnormal widening also of the L2-L3 anterior disc space. Trace vacuum disc there. And there does appear to be a minimally displaced fracture of the left lateral anterior L2 corner on both series 6, image 47 and series 7, image 20. And there does appear to be a similar subtle fracture of the anterior right superior L3 endplate also on image 47. But the L2-L3 and other lumbar facets remain aligned. L4, L5, and visible sacrum appear intact. Grossly intact SI joints. Paraspinal and other soft tissues: Chronic postoperative changes to the lower lumbar paraspinal soft tissues at L4-L5. Previous posterior decompression Calcified aortic atherosclerosis. Absent gallbladder. Negative other visible noncontrast abdominal viscera. Mild ventral L1 and L2 paraspinal edema/hematoma. Disc levels: No CT evidence of significant lumbar spinal stenosis despite E multilevel posttraumatic changes. Previously decompressed L4-L5 level. IMPRESSION: 1. Generalized Osteopenia with Acute: - L1 Chance fracture. Distracted vertebral body fragments but relatively nondisplaced posterior elements. - disrupted L1-L2 disc space with associated anterior superior L2 corner fracture, and probable facet disruption widening of the bilateral L1-L2 facet joints. - probably disrupted anterior L2-L3 disc space also, with subtle anterior L2 and L3 endplate corner fractures on the right. 2. No significant lumbar retropulsion or spinal stenosis by CT. Previous L4-L5 decompression. 3. Chronic lower thoracic kyphotic deformity with prior augmentation and ankylosis as detailed on the thoracic CT today. Electronically Signed   By: Genevie Ann M.D.   On: 04/03/2022 06:27   CT Thoracic Spine Wo  Contrast  Addendum Date: 04/03/2022   ADDENDUM REPORT: 04/03/2022 05:12 ADDENDUM: Study discussed by telephone with Dr. Shanon Rosser on 04/03/2022 at 0446 hours. Also, correction the 3rd sentence in Impression #1 should read: "But aside from MILD DISTRACTION of the L1 vertebral body fragments, the L1 posterior elements are nondisplaced, and no significant spondylolisthesis." Electronically Signed   By: Genevie Ann M.D.   On: 04/03/2022 05:12   Result Date: 04/03/2022 CLINICAL DATA:  88 year old female status post fall. EXAM: CT THORACIC SPINE WITHOUT CONTRAST TECHNIQUE: Multidetector CT images of the thoracic were obtained using the standard protocol without intravenous contrast. RADIATION DOSE REDUCTION: This exam was performed according to the departmental dose-optimization program which includes automated exposure control, adjustment of the mA and/or kV according to patient  size and/or use of iterative reconstruction technique. COMPARISON:  Cervical spine CT today. Thoracic MRI 03/02/2013. CT Chest, Abdomen, and Pelvis today are reported separately. 10/13/2021. FINDINGS: Limited cervical spine imaging:  Reported separately today. Thoracic spine segmentation:  Normal. Alignment: Chronic posttraumatic appearing abnormally increased kyphosis in the lower thoracic spine T10 through T12 appear stable since last year. Straightening of thoracic kyphosis elsewhere. Vertebrae: Diffuse osteopenia. Chronic T10, T11, and T12 vertebral body fractures. T10 vertebral plana, and chronically augmented T11 and T12 levels. Evidence of some interbody ankylosis at those levels through the T12-L1 disc space. The T1 through T9 thoracic levels appear stable and intact compared to last year. No acute posterior rib fracture identified. However, there is an acute horizontal fracture of L1 with chance fracture morphology, horizontal fracture plane tracking into the bilateral L1 pedicles and posterior elements (on the left series 7, image 36).  At the anterior L1 body of the fracture plane is oblique and diagonal. And there is mild distraction through the vertebral body fracture up to 5 mm. No significant spondylolisthesis. Posterior elements remain nondisplaced. Associated nondisplaced fracture of the right L1 transverse process. Furthermore, there is also a fracture of the anterior superior L2 vertebral body corner on series 7, image 23. And the anterior L1-L2 disc space is likely disrupted, mildly distracted. The L1-L2 facet joints also appear mildly distracted and might be disrupted. Paraspinal and other soft tissues: Mild respiratory motion in the chest. Lung base atelectasis has not significantly changed from last year. No pericardial or pleural effusion. Calcified aortic atherosclerosis. L1 and L2 level mild paraspinal muscle edema and/or contusion. Thoracic paraspinal soft tissues remain within normal limits. Visible abdominal viscera appear stable from last year. Disc levels: Thoracic disc spaces are stable from last year including some chronic retropulsion of T10 and T12. Distracted L1-L2 disc space and facets in association with L1 and L2 vertebral fractures, but no significant retropulsion or spondylolisthesis. IMPRESSION: 1. Positive for unstable Chance fracture of L1, with evidence of disrupted L1-L2 disc and associated small L2 anterior superior corner fracture. Possible disruption of the L1-L2 facet joints also. But aside from of the L1 vertebral body fragments, the L1 posterior elements are nondisplaced, no significant spondylolisthesis. Recommend spine surgery consultation and completion of lumbar imaging with dedicated Lumbar CT. 2. 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 No acute traumatic injury identified in the thoracic spine 3.  Aortic Atherosclerosis (ICD10-I70.0). Electronically Signed: By: Genevie Ann M.D. On: 04/03/2022 04:46   CT Cervical Spine  Wo Contrast  Result Date: 04/03/2022 CLINICAL DATA:  88 year old female status post fall. EXAM: CT CERVICAL SPINE WITHOUT CONTRAST TECHNIQUE: Multidetector CT imaging of the cervical spine was performed without intravenous contrast. Multiplanar CT image reconstructions were also generated. RADIATION DOSE REDUCTION: This exam was performed according to the departmental dose-optimization program which includes automated exposure control, adjustment of the mA and/or kV according to patient size and/or use of iterative reconstruction technique. COMPARISON:  Head CT today.  Cervical spine CT 10/13/2021 FINDINGS: Alignment: Stable. Chronic straightening of lordosis, occasional mild degenerative anterolisthesis (C6-C7). Cervicothoracic junction alignment is within normal limits. Bilateral posterior element alignment is within normal limits. Skull base and vertebrae: Visualized skull base is intact. No atlanto-occipital dissociation. C1 and C2 appear intact and aligned. No acute osseous abnormality identified. Soft tissues and spinal canal: No prevertebral fluid or swelling. No visible canal hematoma. Stable and negative for age neck soft tissues. Disc levels: Stable generally  mild for age cervical spine degeneration. Disc and endplate degeneration at C5-C6 with suspected mild spinal stenosis. Upper chest: Negative. IMPRESSION: 1. No acute traumatic injury identified in the cervical spine. 2. Stable generally mild for age cervical spine degeneration. Chronic C5-C6 degeneration and mild spinal stenosis. Electronically Signed   By: Genevie Ann M.D.   On: 04/03/2022 04:35   CT Head Wo Contrast  Result Date: 04/03/2022 CLINICAL DATA:  88 year old female status post fall. EXAM: CT HEAD WITHOUT CONTRAST TECHNIQUE: Contiguous axial images were obtained from the base of the skull through the vertex without intravenous contrast. RADIATION DOSE REDUCTION: This exam was performed according to the departmental dose-optimization  program which includes automated exposure control, adjustment of the mA and/or kV according to patient size and/or use of iterative reconstruction technique. COMPARISON:  Head CT 10/13/2021. FINDINGS: Brain: Stable and normal for age cerebral volume. No midline shift, ventriculomegaly, mass effect, evidence of mass lesion, intracranial hemorrhage or evidence of cortically based acute infarction. Stable gray-white differentiation, minimal to mild for age chronic white matter changes. Vascular: Calcified atherosclerosis at the skull base. No suspicious intracranial vascular hyperdensity. Skull: No acute osseous abnormality identified. Sinuses/Orbits: Visualized paranasal sinuses and mastoids are stable and well aerated. Other: Stable orbit and scalp soft tissues. IMPRESSION: 1. No acute traumatic injury identified. 2. Stable and negative for age noncontrast CT appearance of the brain. Electronically Signed   By: Genevie Ann M.D.   On: 04/03/2022 04:32    ROS: As above Blood pressure (!) 150/62, pulse 60, temperature 98.6 F (37 C), temperature source Oral, resp. rate 16, SpO2 96 %. Estimated body mass index is 26.17 kg/m as calculated from the following:   Height as of 02/14/21: 5' (1.524 m).   Weight as of 02/14/21: 60.8 kg.  Physical Exam  General: An alert and pleasant 88 year old white female in no apparent stress with a TLSO.  HEENT: Normocephalic, edentulous, extraocular muscles are intact  Neck: Unremarkable  Thorax: Symmetric  Abdomen: Soft  Extremities: Unremarkable  Back exam: The patient is wearing a TLSO  Neurologic exam: The patient is alert and oriented.  Cranial nerves are grossly intact.  Her strength is grossly normal all 4 extremities.  Sensation is normal all 4 extremities.  Imaging studies: I reviewed the patient's lumbar CT and lumbar MRI.  She has a minimal L2 compression fracture.  The patient has a significant L1 bony Chance fracture.  She maintains good alignment.  She  has had previous vertebroplasty/kyphoplasty's.  Assessment/Plan: L1 Chance fracture, minimal L2 fracture: I have discussed the situation with the patient.  We have 2 options.  1 is to attempt to mobilize her with a TLSO on and hope she heals without intervention.  The second option would be a thoracolumbar instrumentation and fusion.  At her age and with previous history of compression fractures/osteoporosis I do not think she is a good candidate.  Furthermore she was not interested in surgery.  Hopefully will get by with a TLSO.  We will need to get upright lateral x-rays with her TLSO on.  [name] 04/04/2022, 7:48 AM

## 2022-04-04 NOTE — Progress Notes (Signed)
Case discussed with neurosurgery Dr. Arnoldo Morale, he reviewed DG chest lumbar spine lateral view with TLSO and upright position, current recommendation remains to keep patient at bedrest as discussed with Dr. Arnoldo Morale, will hold on PT/OT evaluation for now awaiting further recommendation from neurosurgery. Phillips Climes MD

## 2022-04-04 NOTE — Progress Notes (Signed)
  Transition of Care [facility]) Screening Note   Patient Details  Name: Erika Conley Date of Birth: Jan 04, 1935   Transition of Care [facility]) CM/SW Contact:    Pollie Friar, RN Phone Number: 04/04/2022, 3:43 PM   Pt is from [facility]. She is getting more back xrays today prior to being able to participate with therapies. Will need therapy to eval to see if she can return to [facility].  Transition of Care Department [facility]) has reviewed patient. We will continue to monitor patient advancement through interdisciplinary progression rounds. If new patient transition needs arise, please place a TOC consult.

## 2022-04-05 DIAGNOSIS — S32012D Unstable burst fracture of first lumbar vertebra, subsequent encounter for fracture with routine healing: Secondary | ICD-10-CM | POA: Diagnosis not present

## 2022-04-05 LAB — CBC
HCT: 42.1 % (ref 36.0–46.0)
Hemoglobin: 14.7 g/dL (ref 12.0–15.0)
MCH: 29.5 pg (ref 26.0–34.0)
MCHC: 34.9 g/dL (ref 30.0–36.0)
MCV: 84.4 fL (ref 80.0–100.0)
Platelets: 136 10*3/uL — ABNORMAL LOW (ref 150–400)
RBC: 4.99 MIL/uL (ref 3.87–5.11)
RDW: 12.8 % (ref 11.5–15.5)
WBC: 7.5 10*3/uL (ref 4.0–10.5)
nRBC: 0 % (ref 0.0–0.2)

## 2022-04-05 LAB — BASIC METABOLIC PANEL
Anion gap: 12 (ref 5–15)
BUN: 12 mg/dL (ref 8–23)
CO2: 28 mmol/L (ref 22–32)
Calcium: 8.9 mg/dL (ref 8.9–10.3)
Chloride: 94 mmol/L — ABNORMAL LOW (ref 98–111)
Creatinine, Ser: 0.66 mg/dL (ref 0.44–1.00)
GFR, Estimated: >60 mL/min (ref >60)
Glucose, Bld: 110 mg/dL — ABNORMAL HIGH (ref 70–99)
Potassium: 4 mmol/L (ref 3.5–5.1)
Sodium: 134 mmol/L — ABNORMAL LOW (ref 135–145)

## 2022-04-05 MED ORDER — FENTANYL CITRATE PF 50 MCG/ML IJ SOSY
25.0000 ug | PREFILLED_SYRINGE | INTRAMUSCULAR | Status: DC | PRN
  Administered 2022-04-05 – 2022-04-06 (×3): 25 ug via INTRAVENOUS
  Filled 2022-04-05 (×3): qty 1

## 2022-04-05 MED ORDER — ENOXAPARIN SODIUM 40 MG/0.4ML IJ SOSY
40.0000 mg | PREFILLED_SYRINGE | INTRAMUSCULAR | Status: DC
  Administered 2022-04-05 – 2022-04-18 (×14): 40 mg via SUBCUTANEOUS
  Filled 2022-04-05 (×13): qty 0.4

## 2022-04-05 MED ORDER — CALCIUM CARBONATE ANTACID 500 MG PO CHEW
2.0000 | CHEWABLE_TABLET | Freq: Three times a day (TID) | ORAL | Status: DC | PRN
  Administered 2022-04-07: 400 mg via ORAL
  Administered 2022-04-12: 600 mg via ORAL
  Administered 2022-04-13: 400 mg via ORAL
  Administered 2022-04-14 – 2022-04-15 (×2): 600 mg via ORAL
  Administered 2022-04-16: 400 mg via ORAL
  Filled 2022-04-05 (×2): qty 3
  Filled 2022-04-05 (×2): qty 2
  Filled 2022-04-05: qty 3
  Filled 2022-04-05 (×2): qty 2

## 2022-04-05 NOTE — Plan of Care (Signed)
  Problem: Education: Goal: Knowledge of General Education information will improve Description: Including pain rating scale, medication(s)/side effects and non-pharmacologic comfort measures Outcome: Progressing   Problem: Clinical Measurements: Goal: Respiratory complications will improve Outcome: Progressing   

## 2022-04-05 NOTE — Progress Notes (Signed)
Patient ID: CERENITY HARDWELL, female   DOB: 1934/05/02, 88 y.o.   MRN: [id number] Subjective: The patient is alert and pleasant.  She is in no apparent    Objective: Vital signs in last 24 hours: Temp:  [97.8 F (36.6 C)-98.4 F (36.9 C)] 98.4 F (36.9 C) (02/14 0840) Pulse Rate:  [60-104] 73 (02/14 0840) Resp:  [16-18] 16 (02/14 0840) BP: (141-160)/(60-88) 157/65 (02/14 0840) SpO2:  [92 %-96 %] 92 % (02/14 0840) Estimated body mass index is 26.17 kg/m as calculated from the following:   Height as of 02/14/21: 5' (1.524 m).   Weight as of 02/14/21: 60.8 kg.   Intake/Output from previous day: 02/13 0701 - 02/14 0700 In: -  Out: 400 [Urine:400] Intake/Output this shift: No intake/output data recorded.  Physical exam the patient is alert and pleasant.  Her lower extremity strength is normal.  Sensation is normal.  Lab Results: Recent Labs    04/04/22 0325 04/05/22 0322  WBC 5.9 7.5  HGB 13.1 14.7  HCT 38.3 42.1  PLT 119* 136*   BMET Recent Labs    04/04/22 0325 04/05/22 0322  NA 136 134*  K 3.6 4.0  CL 99 94*  CO2 29 28  GLUCOSE 101* 110*  BUN 8 12  CREATININE 0.67 0.66  CALCIUM 8.4* 8.9    Studies/Results: ECHOCARDIOGRAM COMPLETE  Result Date: 04/04/2022    ECHOCARDIOGRAM REPORT   Patient Name:   CHANCEY FOLKER Date of Exam: 04/04/2022 Medical Rec #:  [id number]      Height:       60.0 in Accession #:    [id number]     Weight:       134.0 lb Date of Birth:  [birth year]     BSA:          1.574 m Patient Age:    43 years       BP:           169/66 mmHg Patient Gender: F              HR:           63 bpm. Exam Location:  Inpatient Procedure: 2D Echo, Cardiac Doppler and Color Doppler Indications:    Syncope  History:        Patient has prior history of Echocardiogram examinations. Risk                 Factors:Hypertension.  Sonographer:    Phineas Douglas Referring Phys: [place]  1. Left ventricular ejection fraction, by estimation, is 60 to  65%. The left ventricle has normal function. The left ventricle has no regional wall motion abnormalities. There is mild asymmetric left ventricular hypertrophy of the basal-septal segment. Left ventricular diastolic parameters are consistent with Grade II diastolic dysfunction (pseudonormalization).  2. Right ventricular systolic function is normal. The right ventricular size is normal.  3. Left atrial size was mildly dilated.  4. The mitral valve is grossly normal. Trivial mitral valve regurgitation.  5. The aortic valve is tricuspid. There is mild calcification of the aortic valve. There is mild thickening of the aortic valve. Aortic valve regurgitation is not visualized. Aortic valve sclerosis/calcification is present, without any evidence of aortic stenosis. Comparison(s): No significant change from prior study. FINDINGS  Left Ventricle: Left ventricular ejection fraction, by estimation, is 60 to 65%. The left ventricle has normal function. The left ventricle has no regional wall motion abnormalities. The left ventricular internal cavity size  was normal in size. There is  mild asymmetric left ventricular hypertrophy of the basal-septal segment. Left ventricular diastolic parameters are consistent with Grade II diastolic dysfunction (pseudonormalization). Right Ventricle: The right ventricular size is normal. No increase in right ventricular wall thickness. Right ventricular systolic function is normal. Left Atrium: Left atrial size was mildly dilated. Right Atrium: Right atrial size was normal in size. Pericardium: There is no evidence of pericardial effusion. Mitral Valve: The mitral valve is grossly normal. There is mild thickening of the mitral valve leaflet(s). Mild mitral annular calcification. Trivial mitral valve regurgitation. Tricuspid Valve: The tricuspid valve is normal in structure. Tricuspid valve regurgitation is trivial. Aortic Valve: The aortic valve is tricuspid. There is mild calcification of  the aortic valve. There is mild thickening of the aortic valve. Aortic valve regurgitation is not visualized. Aortic valve sclerosis/calcification is present, without any evidence of aortic stenosis. Pulmonic Valve: The pulmonic valve was normal in structure. Pulmonic valve regurgitation is trivial. Aorta: The aortic root is normal in size and structure. IAS/Shunts: The atrial septum is grossly normal.  LEFT VENTRICLE PLAX 2D LVIDd:         3.80 cm      Diastology LVIDs:         2.30 cm      LV e' medial:    5.98 cm/s LV PW:         1.20 cm      LV E/e' medial:  17.2 LV IVS:        1.20 cm      LV e' lateral:   7.72 cm/s LVOT diam:     1.80 cm      LV E/e' lateral: 13.3 LV SV:         60 LV SV Index:   38 LVOT Area:     2.54 cm  LV Volumes (MOD) LV vol d, MOD A2C: 106.0 ml LV vol d, MOD A4C: 100.0 ml LV vol s, MOD A2C: 39.3 ml LV vol s, MOD A4C: 44.5 ml LV SV MOD A2C:     66.7 ml LV SV MOD A4C:     100.0 ml LV SV MOD BP:      60.6 ml RIGHT VENTRICLE RV Basal diam:  3.90 cm RV S prime:     12.10 cm/s TAPSE (M-mode): 2.2 cm LEFT ATRIUM             Index        RIGHT ATRIUM           Index LA diam:        4.20 cm 2.67 cm/m   RA Area:     13.20 cm LA Vol (A2C):   58.0 ml 36.84 ml/m  RA Volume:   28.10 ml  17.85 ml/m LA Vol (A4C):   62.3 ml 39.57 ml/m LA Biplane Vol: 60.0 ml 38.11 ml/m  AORTIC VALVE LVOT Vmax:   94.20 cm/s LVOT Vmean:  63.800 cm/s LVOT VTI:    0.237 m  AORTA Ao Root diam: 2.70 cm Ao Asc diam:  2.90 cm MITRAL VALVE                TRICUSPID VALVE MV Area (PHT): 5.42 cm     TR Peak grad:   30.0 mmHg MV Decel Time: 140 msec     TR Vmax:        274.00 cm/s MV E velocity: 103.00 cm/s MV A velocity: 88.10 cm/s   SHUNTS MV E/A ratio:  1.17         Systemic VTI:  0.24 m                             Systemic Diam: 1.80 cm Gwyndolyn Kaufman MD Electronically signed by Gwyndolyn Kaufman MD Signature Date/Time: 04/04/2022/12:47:33 PM    Final    DG Lumbar Spine 1 View  Result Date: 04/04/2022 CLINICAL DATA:   L1 fracture. EXAM: LUMBAR SPINE - 1 VIEW COMPARISON:  CT scan of April 03, 2022. FINDINGS: Limited exam as the patient was unable to stand or bear weight for this image as well as wearing a brace. Status post kyphoplasty of T11 and T12. Severe degenerative disc disease is noted at L5-S1. L1 compression fracture is not well visualized based on above limitations. However, there does appear to be now noted grade 1 retrolisthesis of L1-2 which may be due to the fracture. Mild grade 1 retrolisthesis of L2-3 is also noted which appears to be more pronounced compared to prior exam. IMPRESSION: L1 compression fracture is not well visualized on this radiograph due to limitations with positioning and wearing a brace. However, there does appear to be significantly increased grade 1 retrolisthesis of L1-2 which may be due to the fracture. Mild grade 1 retrolisthesis of L2-3 is also noted which appears to be more pronounced compared to prior exam. These results will be called to the ordering clinician or representative by the Radiologist Assistant, and communication documented in the PACS or zVision Dashboard. Electronically Signed   By: Marijo Conception M.D.   On: 04/04/2022 12:43    Assessment/Plan: L1 Chance fracture, spinal instability: I have discussed the situation with the patient.  Her x-ray yesterday demonstrated some subluxation at L1-2.  I have told her we have 2 bad options.  1 is prolonged bedrest and hopefully her fracture will heal well.  However there are risks associated with prolonged bedrest as well.  The other is a thoracolumbar instrumentation and fusion.  This is not ideal either as she is osteoporotic, has had kyphoplasty/biopsy as above for fracture, has a kyphotic deformity, etc.  Therefore, there is a significant risk of the instrumentation failure.  Due to bed options, I favor bedrest.  The patient agrees.  She does not want to proceed with surgery presently.  She has been to speak with her  son who is her power of attorney.  I will try to contact him later today.  LOS: 2 days     [name] 04/05/2022, 10:16 AM

## 2022-04-05 NOTE — Progress Notes (Signed)
As requested by the patient, I spoke with her son can and updated him on the patient's condition and the treatment options.  I have answered all his questions.

## 2022-04-05 NOTE — Progress Notes (Signed)
Erika Conley  [id number] DOB: 06/30/1934 DOA: 04/03/2022 PCP: Pcp, No    Brief Narrative:  88 year old ILF [employer]) resident with a history of anxiety/depression, HTN, hypothyroidism, and fibromyalgia who ambulates with a cane at baseline but fell 2 nights prior to her presentation after simply losing her balance and falling into the tub/shower.  She immediately experienced significant low back pain following the fall.  In the ER she was found to have an unstable L1 compression fracture.  Consultants:  Neurosurgery  Goals of Care:  Code Status: DNR   DVT prophylaxis: Lovenox  Interim Hx: Afebrile.  Vital signs stable.  Reports the pain medication is helpful but wears off too early.  Denies shortness of breath or chest pain.  No new complaints.  Assessment & Plan:  Unstable L1 vertebral fracture status post mechanical fall Neurosurgery directing care -continue TLSO brace -avoid PT/OT for now -the most appropriate surgical intervention is felt to be a thoracolumbar instrumentation and fusion, though she is felt to be a poor candidate for this -it is hoped that she will stabilize/improve with conservative medical management  HTN Blood pressure modestly elevated -avoid overaggressive correction given advanced age  Hypothyroidism Continue usual Synthroid  Baseline mild dementia Continue Aricept  Anxiety/depression Continue usual doses of BuSpar and Ativan   Family Communication:  Disposition: From ILF facility   Objective: Blood pressure (!) 157/65, pulse 73, temperature 98.4 F (36.9 C), temperature source Oral, resp. rate 16, SpO2 92 %. No intake or output data in the 24 hours ending 04/05/22 0954 There were no vitals filed for this visit.  Examination: General: No acute respiratory distress Lungs: Clear to auscultation bilaterally without wheezes or crackles Cardiovascular: Regular rate and rhythm without murmur gallop or rub normal S1 and S2 Abdomen:  Nontender, nondistended, soft, bowel sounds positive, no rebound, no ascites, no appreciable mass Extremities: No significant cyanosis, clubbing, or edema bilateral lower extremities  CBC: Recent Labs  Lab 04/03/22 0516 04/04/22 0325 04/05/22 0322  WBC 8.3 5.9 7.5  NEUTROABS 6.9 3.8  --   HGB 14.2 13.1 14.7  HCT 41.7 38.3 42.1  MCV 86.5 86.5 84.4  PLT 133* 119* [id number]*   Basic Metabolic Panel: Recent Labs  Lab 04/03/22 0516 04/04/22 0325 04/05/22 0322  NA 138 136 134*  K 3.4* 3.6 4.0  CL 99 99 94*  CO2 29 29 28  $ GLUCOSE 120* 101* 110*  BUN 9 8 12  $ CREATININE 0.70 0.67 0.66  CALCIUM 8.7* 8.4* 8.9  MG  --  1.9  --    GFR: CrCl cannot be calculated (Unknown ideal weight.).   Scheduled Meds:  acetaminophen  1,000 mg Oral TID   amLODipine  10 mg Oral Daily   busPIRone  30 mg Oral BID   calcium-vitamin D  2 tablet Oral BID WC   donepezil  10 mg Oral QHS   enoxaparin (LOVENOX) injection  40 mg Subcutaneous Q24H   famotidine  20 mg Oral BID   levothyroxine  100 mcg Oral Q0600   lidocaine  1 patch Transdermal Daily   LORazepam  0.5 mg Oral QHS   methocarbamol  500 mg Oral TID   pantoprazole  40 mg Oral BID   polyethylene glycol  17 g Oral Daily   senna-docusate  2 tablet Oral BID   sertraline  200 mg Oral Daily      LOS: 2 days   Cherene Altes, MD [facility] Office  [phone] Pager - Text Page per  Amion  If 7PM-7AM, please contact night-coverage per Amion 04/05/2022, 9:54 AM

## 2022-04-06 DIAGNOSIS — S32012D Unstable burst fracture of first lumbar vertebra, subsequent encounter for fracture with routine healing: Secondary | ICD-10-CM | POA: Diagnosis not present

## 2022-04-06 MED ORDER — FENTANYL CITRATE PF 50 MCG/ML IJ SOSY
25.0000 ug | PREFILLED_SYRINGE | INTRAMUSCULAR | Status: DC | PRN
  Administered 2022-04-06: 25 ug via INTRAVENOUS
  Filled 2022-04-06: qty 1

## 2022-04-06 MED ORDER — HYDROXYZINE HCL 10 MG PO TABS
10.0000 mg | ORAL_TABLET | Freq: Three times a day (TID) | ORAL | Status: DC | PRN
  Administered 2022-04-07 – 2022-04-11 (×6): 10 mg via ORAL
  Filled 2022-04-06 (×6): qty 1

## 2022-04-06 MED ORDER — ORAL CARE MOUTH RINSE: 15.0000 mL | OROMUCOSAL | Status: DC | PRN

## 2022-04-06 MED ORDER — OXYCODONE HCL 5 MG PO TABS
5.0000 mg | ORAL_TABLET | ORAL | Status: DC | PRN
  Administered 2022-04-06 – 2022-04-08 (×8): 5 mg via ORAL
  Filled 2022-04-06 (×8): qty 1

## 2022-04-06 NOTE — Care Management Important Message (Signed)
Important Message  Patient Details  Name: Erika Conley MRN: [id number] Date of Birth: 1934-03-14   Medicare Important Message Given:  Yes     Orbie Pyo 04/06/2022, 3:23 PM

## 2022-04-06 NOTE — Progress Notes (Signed)
Subjective:  The patient is alert and pleasant.  She is in no apparent distress.  Objective: Vital signs in last 24 hours: Temp:  [98 F (36.7 C)-98.7 F (37.1 C)] 98 F (36.7 C) (02/15 0802) Pulse Rate:  [70-102] 70 (02/15 0802) Resp:  [14-16] 14 (02/15 0802) BP: (142-157)/(60-77) 142/60 (02/15 0802) SpO2:  [94 %-96 %] 96 % (02/15 0802) Estimated body mass index is 26.17 kg/m as calculated from the following:   Height as of 02/14/21: 5' (1.524 m).   Weight as of 02/14/21: 60.8 kg.   Intake/Output from previous day: No intake/output data recorded. Intake/Output this shift: No intake/output data recorded.  Physical exam The patient is alert and pleasant.  She is oriented.  Her strength is normal.  Lab Results: Recent Labs    04/04/22 0325 04/05/22 0322  WBC 5.9 7.5  HGB 13.1 14.7  HCT 38.3 42.1  PLT 119* 136*   BMET Recent Labs    04/04/22 0325 04/05/22 0322  NA 136 134*  K 3.6 4.0  CL 99 94*  CO2 29 28  GLUCOSE 101* 110*  BUN 8 12  CREATININE 0.67 0.66  CALCIUM 8.4* 8.9    Studies/Results: ECHOCARDIOGRAM COMPLETE  Result Date: 04/04/2022    ECHOCARDIOGRAM REPORT   Patient Name:   FAREEHA OKULA Date of Exam: 04/04/2022 Medical Rec #:  [id number]      Height:       60.0 in Accession #:    [id number]     Weight:       134.0 lb Date of Birth:  1935/01/31     BSA:          1.574 m Patient Age:    88 years       BP:           169/66 mmHg Patient Gender: F              HR:           63 bpm. Exam Location:  Inpatient Procedure: 2D Echo, Cardiac Doppler and Color Doppler Indications:    Syncope  History:        Patient has prior history of Echocardiogram examinations. Risk                 Factors:Hypertension.  Sonographer:    Phineas Douglas Referring Phys: [place]  1. Left ventricular ejection fraction, by estimation, is 60 to 65%. The left ventricle has normal function. The left ventricle has no regional wall motion abnormalities. There is mild  asymmetric left ventricular hypertrophy of the basal-septal segment. Left ventricular diastolic parameters are consistent with Grade II diastolic dysfunction (pseudonormalization).  2. Right ventricular systolic function is normal. The right ventricular size is normal.  3. Left atrial size was mildly dilated.  4. The mitral valve is grossly normal. Trivial mitral valve regurgitation.  5. The aortic valve is tricuspid. There is mild calcification of the aortic valve. There is mild thickening of the aortic valve. Aortic valve regurgitation is not visualized. Aortic valve sclerosis/calcification is present, without any evidence of aortic stenosis. Comparison(s): No significant change from prior study. FINDINGS  Left Ventricle: Left ventricular ejection fraction, by estimation, is 60 to 65%. The left ventricle has normal function. The left ventricle has no regional wall motion abnormalities. The left ventricular internal cavity size was normal in size. There is  mild asymmetric left ventricular hypertrophy of the basal-septal segment. Left ventricular diastolic parameters are consistent with Grade II  diastolic dysfunction (pseudonormalization). Right Ventricle: The right ventricular size is normal. No increase in right ventricular wall thickness. Right ventricular systolic function is normal. Left Atrium: Left atrial size was mildly dilated. Right Atrium: Right atrial size was normal in size. Pericardium: There is no evidence of pericardial effusion. Mitral Valve: The mitral valve is grossly normal. There is mild thickening of the mitral valve leaflet(s). Mild mitral annular calcification. Trivial mitral valve regurgitation. Tricuspid Valve: The tricuspid valve is normal in structure. Tricuspid valve regurgitation is trivial. Aortic Valve: The aortic valve is tricuspid. There is mild calcification of the aortic valve. There is mild thickening of the aortic valve. Aortic valve regurgitation is not visualized. Aortic  valve sclerosis/calcification is present, without any evidence of aortic stenosis. Pulmonic Valve: The pulmonic valve was normal in structure. Pulmonic valve regurgitation is trivial. Aorta: The aortic root is normal in size and structure. IAS/Shunts: The atrial septum is grossly normal.  LEFT VENTRICLE PLAX 2D LVIDd:         3.80 cm      Diastology LVIDs:         2.30 cm      LV e' medial:    5.98 cm/s LV PW:         1.20 cm      LV E/e' medial:  17.2 LV IVS:        1.20 cm      LV e' lateral:   7.72 cm/s LVOT diam:     1.80 cm      LV E/e' lateral: 13.3 LV SV:         60 LV SV Index:   38 LVOT Area:     2.54 cm  LV Volumes (MOD) LV vol d, MOD A2C: 106.0 ml LV vol d, MOD A4C: 100.0 ml LV vol s, MOD A2C: 39.3 ml LV vol s, MOD A4C: 44.5 ml LV SV MOD A2C:     66.7 ml LV SV MOD A4C:     100.0 ml LV SV MOD BP:      60.6 ml RIGHT VENTRICLE RV Basal diam:  3.90 cm RV S prime:     12.10 cm/s TAPSE (M-mode): 2.2 cm LEFT ATRIUM             Index        RIGHT ATRIUM           Index LA diam:        4.20 cm 2.67 cm/m   RA Area:     13.20 cm LA Vol (A2C):   58.0 ml 36.84 ml/m  RA Volume:   28.10 ml  17.85 ml/m LA Vol (A4C):   62.3 ml 39.57 ml/m LA Biplane Vol: 60.0 ml 38.11 ml/m  AORTIC VALVE LVOT Vmax:   94.20 cm/s LVOT Vmean:  63.800 cm/s LVOT VTI:    0.237 m  AORTA Ao Root diam: 2.70 cm Ao Asc diam:  2.90 cm MITRAL VALVE                TRICUSPID VALVE MV Area (PHT): 5.42 cm     TR Peak grad:   30.0 mmHg MV Decel Time: 140 msec     TR Vmax:        274.00 cm/s MV E velocity: 103.00 cm/s MV A velocity: 88.10 cm/s   SHUNTS MV E/A ratio:  1.17         Systemic VTI:  0.24 m  Systemic Diam: 1.80 cm Gwyndolyn Kaufman MD Electronically signed by Gwyndolyn Kaufman MD Signature Date/Time: 04/04/2022/12:47:33 PM    Final    DG Lumbar Spine 1 View  Result Date: 04/04/2022 CLINICAL DATA:  L1 fracture. EXAM: LUMBAR SPINE - 1 VIEW COMPARISON:  CT scan of April 03, 2022. FINDINGS: Limited exam as the  patient was unable to stand or bear weight for this image as well as wearing a brace. Status post kyphoplasty of T11 and T12. Severe degenerative disc disease is noted at L5-S1. L1 compression fracture is not well visualized based on above limitations. However, there does appear to be now noted grade 1 retrolisthesis of L1-2 which may be due to the fracture. Mild grade 1 retrolisthesis of L2-3 is also noted which appears to be more pronounced compared to prior exam. IMPRESSION: L1 compression fracture is not well visualized on this radiograph due to limitations with positioning and wearing a brace. However, there does appear to be significantly increased grade 1 retrolisthesis of L1-2 which may be due to the fracture. Mild grade 1 retrolisthesis of L2-3 is also noted which appears to be more pronounced compared to prior exam. These results will be called to the ordering clinician or representative by the Radiologist Assistant, and communication documented in the PACS or zVision Dashboard. Electronically Signed   By: Marijo Conception M.D.   On: 04/04/2022 12:43    Assessment/Plan The patient is alert and pleasant.  She is oriented.  Her strength is normal.  L1 Chance fracture, spinal instability, lumbago: I have again discussed this at patient with the patient.  We discussed the treatment options of prolonged bedrest versus surgery.  Neither is a great option.  She does not want surgery.  We will continue with bedrest and hopefully she will heal without surgery.   LOS: 3 days     Erika Conley 04/06/2022, 8:48 AM     Patient ID: [name], female   DOB: [birth year], 88 y.o.   MRN: [id number]

## 2022-04-06 NOTE — Plan of Care (Signed)

## 2022-04-06 NOTE — Plan of Care (Signed)
  Problem: Education: Goal: Knowledge of General Education information will improve Description: Including pain rating scale, medication(s)/side effects and non-pharmacologic comfort measures Outcome: Progressing   Problem: Nutrition: Goal: Adequate nutrition will be maintained Outcome: Progressing   

## 2022-04-06 NOTE — Plan of Care (Signed)

## 2022-04-06 NOTE — Progress Notes (Signed)
Erika Conley  [id number] DOB: [birth year] DOA: 04/03/2022 PCP: Pcp, No    Brief Narrative:  88 year old ILF [employer]) resident with a history of anxiety/depression, HTN, hypothyroidism, and fibromyalgia who ambulates with a cane at baseline but fell 2 nights prior to her presentation after simply losing her balance and falling into the tub/shower.  She immediately experienced significant low back pain following the fall.  In the ER she was found to have an unstable L1 compression fracture.  Consultants:  Neurosurgery  Goals of Care:  Code Status: DNR   DVT prophylaxis: Lovenox  Interim Hx: No acute events recorded overnight.  Afebrile.  Vital signs stable.  Assessment & Plan:  Unstable L1 vertebral fracture status post mechanical fall Neurosurgery directing care -  continue TLSO brace -avoid PT/OT for now - surgical intervention would mean a thoracolumbar instrumentation and fusion, and she is felt to be a very poor candidate for this - after lengthy discussion with Neurosurgery the patient does not desire surgery and we will pursue conservative management with bracing and bedrest initially, followed by therapy - the high risk of either approach has been explained to the patient both by NS and me  HTN Follow without change -blood pressure well-controlled  Hypothyroidism Continue usual Synthroid  Baseline mild dementia Continue Aricept  Anxiety/depression Continue usual doses of BuSpar and Ativan   Family Communication:  Disposition: From ILF facility -is not presently able to participate in therapy due to the risk of spinal instability - will need SNF placement for care until she has improved enough to begin therapy   Objective: Blood pressure (!) 142/60, pulse 70, temperature 98 F (36.7 C), temperature source Oral, resp. rate 14, SpO2 96 %. No intake or output data in the 24 hours ending 04/06/22 0854 There were no vitals filed for this  visit.  Examination: General: No acute respiratory distress Lungs: Clear to auscultation bilaterally  Cardiovascular: Regular rate and rhythm  Abdomen: Nontender, nondistended, soft, bowel sounds positive Extremities: No significant edema bilateral lower extremities  CBC: Recent Labs  Lab 04/03/22 0516 04/04/22 0325 04/05/22 0322  WBC 8.3 5.9 7.5  NEUTROABS 6.9 3.8  --   HGB 14.2 13.1 14.7  HCT 41.7 38.3 42.1  MCV 86.5 86.5 84.4  PLT 133* 119* 136*    Basic Metabolic Panel: Recent Labs  Lab 04/03/22 0516 04/04/22 0325 04/05/22 0322  NA 138 136 134*  K 3.4* 3.6 4.0  CL 99 99 94*  CO2 29 29 28  $ GLUCOSE 120* 101* 110*  BUN 9 8 12  $ CREATININE 0.70 0.67 0.66  CALCIUM 8.7* 8.4* 8.9  MG  --  1.9  --     GFR: CrCl cannot be calculated (Unknown ideal weight.).   Scheduled Meds:  acetaminophen  1,000 mg Oral TID   amLODipine  10 mg Oral Daily   busPIRone  30 mg Oral BID   calcium-vitamin D  2 tablet Oral BID WC   donepezil  10 mg Oral QHS   enoxaparin (LOVENOX) injection  40 mg Subcutaneous Q24H   famotidine  20 mg Oral BID   levothyroxine  100 mcg Oral Q0600   lidocaine  1 patch Transdermal Daily   LORazepam  0.5 mg Oral QHS   methocarbamol  500 mg Oral TID   pantoprazole  40 mg Oral BID   polyethylene glycol  17 g Oral Daily   senna-docusate  2 tablet Oral BID   sertraline  200 mg Oral Daily  LOS: 3 days   Cherene Altes, MD [facility] Office  [phone] Pager - Text Page per Amion  If 7PM-7AM, please contact night-coverage per Amion 04/06/2022, 8:54 AM

## 2022-04-07 DIAGNOSIS — S32012D Unstable burst fracture of first lumbar vertebra, subsequent encounter for fracture with routine healing: Secondary | ICD-10-CM | POA: Diagnosis not present

## 2022-04-07 MED ORDER — ACETAMINOPHEN 325 MG PO TABS
650.0000 mg | ORAL_TABLET | Freq: Four times a day (QID) | ORAL | Status: DC
  Administered 2022-04-07 – 2022-04-10 (×13): 650 mg via ORAL
  Filled 2022-04-07 (×13): qty 2

## 2022-04-07 MED ORDER — OXYCODONE HCL ER 10 MG PO T12A
10.0000 mg | EXTENDED_RELEASE_TABLET | Freq: Two times a day (BID) | ORAL | Status: DC
  Administered 2022-04-07 – 2022-04-08 (×3): 10 mg via ORAL
  Filled 2022-04-07 (×3): qty 1

## 2022-04-07 NOTE — Progress Notes (Signed)
Erika Conley  [id number] DOB: 10-Feb-1935 DOA: 04/03/2022 PCP: Pcp, No    Brief Narrative:  88 year old ILF [employer]) resident with a history of anxiety/depression, HTN, hypothyroidism, and fibromyalgia who ambulates with a cane at baseline but fell 2 nights prior to her presentation after simply losing her balance and falling into the tub/shower.  She immediately experienced significant low back pain following the fall.  In the ER she was found to have an unstable L1 compression fracture.  Consultants:  Neurosurgery  Goals of Care:  Code Status: DNR   DVT prophylaxis: Lovenox  Interim Hx: Afebrile.  Vital signs stable with exception of blood pressure that is trending upward slightly.  Adjustments are being made in her pain medication regimen.  No acute events reported overnight. Pain control continues to be an issue. NS has added oxycontin.   Assessment & Plan:  Unstable L1 vertebral fracture status post mechanical fall Neurosurgery directing care -  continue TLSO brace - avoid PT/OT for now - surgical intervention would mean a thoracolumbar instrumentation and fusion, and she is felt to be a very poor candidate for this - after lengthy discussion with Neurosurgery the patient does not desire surgery and we will pursue conservative management with bracing and bedrest initially, followed by therapy - the high risk of either approach has been explained to the patient both by NS and me - hold fentanyl w/ addition of oxycontin to avoid excessive dosing once long term oral does kicks in   HTN Follow without change -blood pressure trending upward slightly -avoid overcorrection given advanced age -monitor without change for today  Hypothyroidism Continue usual Synthroid  Baseline mild dementia Continue Aricept  Anxiety/depression Continue usual doses of BuSpar and Ativan   Family Communication:  Disposition: From ILF facility -is not presently able to participate in therapy  due to the risk of spinal instability - will need prolonged bedrest as per neurosurgery, which may necessitate her staying in the hospital if supportive care cannot be arranged as an outpatient   Objective: Blood pressure (!) 157/82, pulse 80, temperature 98.6 F (37 C), temperature source Oral, resp. rate 17, SpO2 94 %.  Intake/Output Summary (Last 24 hours) at 04/07/2022 0819 Last data filed at 04/07/2022 0354 Gross per 24 hour  Intake --  Output 600 ml  Net -600 ml   There were no vitals filed for this visit.  Examination: General: No acute respiratory distress Lungs: Clear to auscultation bilaterally  Cardiovascular: Regular rate and rhythm  Abdomen: NT/ND, soft, bowel sounds positive Extremities: No significant edema bilateral lower extremities  CBC: Recent Labs  Lab 04/03/22 0516 04/04/22 0325 04/05/22 0322  WBC 8.3 5.9 7.5  NEUTROABS 6.9 3.8  --   HGB 14.2 13.1 14.7  HCT 41.7 38.3 42.1  MCV 86.5 86.5 84.4  PLT 133* 119* 136*    Basic Metabolic Panel: Recent Labs  Lab 04/03/22 0516 04/04/22 0325 04/05/22 0322  NA 138 136 134*  K 3.4* 3.6 4.0  CL 99 99 94*  CO2 29 29 28  $ GLUCOSE 120* 101* 110*  BUN 9 8 12  $ CREATININE 0.70 0.67 0.66  CALCIUM 8.7* 8.4* 8.9  MG  --  1.9  --     GFR: CrCl cannot be calculated (Unknown ideal weight.).   Scheduled Meds:  acetaminophen  1,000 mg Oral TID   amLODipine  10 mg Oral Daily   busPIRone  30 mg Oral BID   calcium-vitamin D  2 tablet Oral BID WC  donepezil  10 mg Oral QHS   enoxaparin (LOVENOX) injection  40 mg Subcutaneous Q24H   famotidine  20 mg Oral BID   levothyroxine  100 mcg Oral Q0600   lidocaine  1 patch Transdermal Daily   LORazepam  0.5 mg Oral QHS   methocarbamol  500 mg Oral TID   oxyCODONE  10 mg Oral Q12H   pantoprazole  40 mg Oral BID   polyethylene glycol  17 g Oral Daily   senna-docusate  2 tablet Oral BID   sertraline  200 mg Oral Daily      LOS: 4 days   Cherene Altes,  MD [facility] Office  [phone] Pager - Text Page per Shea Evans  If 7PM-7AM, please contact night-coverage per Amion 04/07/2022, 8:19 AM

## 2022-04-07 NOTE — Progress Notes (Signed)
Patient states that her pain is well controlled with PRN Oxycodone so far. Will continue to monitor.

## 2022-04-07 NOTE — Progress Notes (Signed)
Subjective: The patient is alert and pleasant.  She complains of adequate pain management.  She notes the medications wear off too soon.  Objective: Vital signs in last 24 hours: Temp:  [97.8 F (36.6 C)-98.5 F (36.9 C)] 98.5 F (36.9 C) (02/16 0411) Pulse Rate:  [65-123] 72 (02/16 0411) Resp:  [14-18] 16 (02/16 0411) BP: (140-173)/(60-89) 173/74 (02/16 0411) SpO2:  [94 %-96 %] 95 % (02/16 0411) Estimated body mass index is 26.17 kg/m as calculated from the following:   Height as of 02/14/21: 5' (1.524 m).   Weight as of 02/14/21: 60.8 kg.   Intake/Output from previous day: 02/15 0701 - 02/16 0700 In: -  Out: 600 [Urine:600] Intake/Output this shift: No intake/output data recorded.  Physical exam patient is alert and oriented.  Her strength is normal.  Lab Results: Recent Labs    04/05/22 0322  WBC 7.5  HGB 14.7  HCT 42.1  PLT 136*   BMET Recent Labs    04/05/22 0322  NA 134*  K 4.0  CL 94*  CO2 28  GLUCOSE 110*  BUN 12  CREATININE 0.66  CALCIUM 8.9    Studies/Results: No results found.  Assessment/Plan: L1 Chance fracture: I will add OxyContin to see if we can get more steady pain control.  The plan is for prolonged bedrest.  LOS: 4 days     [name] 04/07/2022, 7:45 AM     Patient ID: Erika Conley, female   DOB: 06/14/1934, 88 y.o.   MRN: [id number]

## 2022-04-07 NOTE — TOC Initial Note (Signed)
Transition of Care [facility]) - Initial/Assessment Note    Patient Details  Name: Erika Conley MRN: [id number] Date of Birth: November 28, 1934  Transition of Care [facility]) CM/SW Contact:    Geralynn Ochs, LCSW Phone Number: 04/07/2022, 3:53 PM  Clinical Narrative:       CSW noting per chart review complete bed rest for patient. CSW contacted patient's ALF, [facility], to discuss care needs and they are unable to manage patient in ALF with complete bed rest. CSW met with patient to discuss, and she said they were working on moving her to [facility], and to call her son, [name]. CSW contacted [name] to discuss, and patient is not being moved to [facility], [name] is unsure where that came from. [name] doesn't know how patient would pay for SNF care as she already doesn't get enough in her social security to pay for [facility], but she's been there so long that they let her stay. Patient has exhausted all other funds except her social security, which goes directly to [facility]. Son and ALF has attempted multiple times to apply for Medicaid but patient has been denied every time. Son unsure about social security amount, CSW has to contact [facility]. CSW to follow.       Expected Discharge Plan: Skilled Nursing Facility Barriers to Discharge: Continued Medical Work up   Patient Goals and CMS Choice Patient states their goals for this hospitalization and ongoing recovery are:: to get better CMS Medicare.gov Compare Post Acute Care list provided to:: Patient Choice offered to / list presented to : Patient, Adult [place] ownership interest in [facility].provided to:: Adult Children    Expected Discharge Plan and Services     Post Acute Care Choice: NA Living arrangements for the past 2 months: [place]                                      Prior Living Arrangements/Services Living arrangements for the past 2 months: [place] Lives with::  Facility Resident Patient language and need for interpreter reviewed:: No Do you feel safe going back to the place where you live?: Yes      Need for Family Participation in Patient Care: Yes (Comment) Care giver support system in place?: No (comment)   Criminal Activity/Legal Involvement Pertinent to Current Situation/Hospitalization: No - Comment as needed  Activities of Daily Living      Permission Sought/Granted Permission sought to share information with : Customer service manager, Family Supports Permission granted to share information with : Yes, Verbal Permission Granted  Share Information with NAME: [name]  Permission granted to share info w AGENCY: Consulting civil engineer granted to share info w Relationship: Son     Emotional Assessment Appearance:: Appears stated age Attitude/Demeanor/Rapport: Engaged Affect (typically observed): Appropriate Orientation: : Oriented to Self, Oriented to Place, Oriented to  Time, Oriented to Situation Alcohol / Substance Use: Not Applicable Psych Involvement: No (comment)  Admission diagnosis:  Closed compression fracture of L2 lumbar vertebra, initial encounter ([place]) [S32.020A] Traumatic compression fracture of L1 lumbar vertebra ([place]) [S32.010A] Other closed fracture of first lumbar vertebra, initial encounter ([place]) [S32.018A] Fall in shower [W18.2XXA] Patient Active Problem List   Diagnosis Date Noted   Lumbar vertebral fracture ([place]) 04/03/2022   Anxiety and depression 04/03/2022   HTN (hypertension) 04/03/2022   Dementia without behavioral disturbance ([place]) 04/03/2022  Traumatic compression fracture of L1 lumbar vertebra ([facility]) 04/03/2022   Syncope and collapse 12/09/2018   Syncope 12/08/2018   Nausea and vomiting 12/14/2015   Hyponatremia 12/13/2015   Obstructive jaundice    Bile duct stricture    Pancreatitis 10/23/2015   Abdominal pain 10/22/2015   Pancreatic mass 10/22/2015   Common bile duct (CBD) obstruction     Abnormal LFTs    Dehydration with hyponatremia 04/09/2015   Acute respiratory failure with hypoxia ([place]) 04/09/2015   Thrombocytopenia ([place]) 04/09/2015   Hypokalemia 11/10/2014   Hypertension 11/10/2014   Hypothyroidism 11/10/2014   Syncope due to orthostatic hypotension 11/10/2014   Constipated    UTI (urinary tract infection) 06/04/2013   Altered mental status 06/04/2013   PCP:  Pcp, No Pharmacy:  No Pharmacies Listed    Social Determinants of Health (SDOH) Social History: SDOH Screenings   Tobacco Use: Low Risk  (10/13/2021)   SDOH Interventions:     Readmission Risk Interventions     No data to display

## 2022-04-08 DIAGNOSIS — S32012D Unstable burst fracture of first lumbar vertebra, subsequent encounter for fracture with routine healing: Secondary | ICD-10-CM | POA: Diagnosis not present

## 2022-04-08 LAB — BASIC METABOLIC PANEL
Anion gap: 11 (ref 5–15)
BUN: 11 mg/dL (ref 8–23)
CO2: 31 mmol/L (ref 22–32)
Calcium: 8.9 mg/dL (ref 8.9–10.3)
Chloride: 93 mmol/L — ABNORMAL LOW (ref 98–111)
Creatinine, Ser: 0.7 mg/dL (ref 0.44–1.00)
GFR, Estimated: >60 mL/min (ref >60)
Glucose, Bld: 125 mg/dL — ABNORMAL HIGH (ref 70–99)
Potassium: 3.2 mmol/L — ABNORMAL LOW (ref 3.5–5.1)
Sodium: 135 mmol/L (ref 135–145)

## 2022-04-08 LAB — CBC
HCT: 41.8 % (ref 36.0–46.0)
Hemoglobin: 15.1 g/dL — ABNORMAL HIGH (ref 12.0–15.0)
MCH: 30.2 pg (ref 26.0–34.0)
MCHC: 36.1 g/dL — ABNORMAL HIGH (ref 30.0–36.0)
MCV: 83.6 fL (ref 80.0–100.0)
Platelets: 153 10*3/uL (ref 150–400)
RBC: 5 MIL/uL (ref 3.87–5.11)
RDW: 13.1 % (ref 11.5–15.5)
WBC: 10.4 10*3/uL (ref 4.0–10.5)
nRBC: 0 % (ref 0.0–0.2)

## 2022-04-08 MED ORDER — POTASSIUM CHLORIDE CRYS ER 20 MEQ PO TBCR
20.0000 meq | EXTENDED_RELEASE_TABLET | Freq: Two times a day (BID) | ORAL | Status: AC
  Administered 2022-04-08 – 2022-04-09 (×3): 20 meq via ORAL
  Filled 2022-04-08 (×3): qty 1

## 2022-04-08 MED ORDER — SODIUM CHLORIDE 0.9 % IV SOLN: INTRAVENOUS | Status: DC

## 2022-04-08 MED ORDER — OXYCODONE HCL 5 MG PO TABS
5.0000 mg | ORAL_TABLET | ORAL | Status: DC | PRN
  Administered 2022-04-08 – 2022-04-10 (×6): 10 mg via ORAL
  Filled 2022-04-08 (×6): qty 2

## 2022-04-08 MED ORDER — FENTANYL CITRATE PF 50 MCG/ML IJ SOSY
12.5000 ug | PREFILLED_SYRINGE | INTRAMUSCULAR | Status: DC | PRN
  Administered 2022-04-08 – 2022-04-09 (×4): 12.5 ug via INTRAVENOUS
  Filled 2022-04-08 (×4): qty 1

## 2022-04-08 MED ORDER — ALPRAZOLAM 0.25 MG PO TABS
0.2500 mg | ORAL_TABLET | Freq: Two times a day (BID) | ORAL | Status: DC | PRN
  Administered 2022-04-08 – 2022-04-10 (×4): 0.25 mg via ORAL
  Filled 2022-04-08 (×4): qty 1

## 2022-04-08 NOTE — Progress Notes (Signed)
Patient ID: Erika Conley, female   DOB: [birth year], 88 y.o.   MRN: [id number] BP (!) 151/90 (BP Location: Right Arm)   Pulse (!) 115   Temp 99.1 F (37.3 C) (Oral)   Resp 18   SpO2 95%  Alert, oriented. Mild confusion. Following all commands Lying in bed. Does not appear uncomfortable.  Will continue bedrest

## 2022-04-08 NOTE — Progress Notes (Signed)
Erika Conley  [id number] DOB: 1934-03-11 DOA: 04/03/2022 PCP: Pcp, No    Brief Narrative:  88 year old ILF [employer] pl[employer]) resident with a history of anxiety/depression, HTN, hypothyroidism, and fibromyalgia who ambulates with a cane at baseline but fell 2 nights prior to her presentation after simply losing her balance and falling into the tub/shower.  She immediately experienced significant low back pain following the fall.  In the ER she was found to have an unstable L1 compression fracture.  Consultants:  Neurosurgery  Goals of Care:  Code Status: DNR   DVT prophylaxis: Lovenox  Interim Hx: Afebrile.  Blood pressure stable.  Some mild tachycardia this morning.  Saturations 94% room air. Reports that pain in her back is very poorly controlled today. States IV fentanyl is the only agent that has been helpful thus far. We had been attempting to transition to long and short acting oral agents alone.   Assessment & Plan:  Unstable L1 vertebral fracture status post mechanical fall Neurosurgery directing care -  continue TLSO brace - avoid PT/OT for now under strict bedrest - surgical intervention would mean a thoracolumbar instrumentation and fusion, and she is felt to be a very poor candidate for this - after lengthy discussion with Neurosurgery the patient does not desire surgery and we will pursue conservative management with bracing and bedrest initially, followed by therapy - the high risk of either approach has been explained to the patient both by NS and me - transition back to IV fentanyl today for severe pain - stop oxycontin and give trial of duragesic patch once washout complete   HTN Follow without change -blood pressure trending upward slightly -avoid overcorrection given advanced age -monitor without change for today  Hypothyroidism Continue usual Synthroid  Baseline mild dementia Continue Aricept  Anxiety/depression Continue usual doses of BuSpar and  Ativan   Family Communication:  Disposition: From ILF facility -is not presently able to participate in therapy due to the risk of spinal instability - will need prolonged bedrest as per Neurosurgery, which may necessitate her staying in the hospital if supportive care cannot be arranged as an outpatient    Objective: Blood pressure (!) 148/84, pulse (!) 120, temperature 99.2 F (37.3 C), temperature source Oral, resp. rate 17, SpO2 94 %. No intake or output data in the 24 hours ending 04/08/22 0743  There were no vitals filed for this visit.  Examination: General: No acute respiratory distress Lungs: Clear to auscultation bilaterally  Cardiovascular: RRR  Abdomen: NT/ND, soft, bowel sounds positive Extremities: No significant edema bilateral lower extremities  CBC: Recent Labs  Lab 04/03/22 0516 04/04/22 0325 04/05/22 0322 04/08/22 0539  WBC 8.3 5.9 7.5 10.4  NEUTROABS 6.9 3.8  --   --   HGB 14.2 13.1 14.7 15.1*  HCT 41.7 38.3 42.1 41.8  MCV 86.5 86.5 84.4 83.6  PLT 133* 119* 136* 0000000    Basic Metabolic Panel: Recent Labs  Lab 04/04/22 0325 04/05/22 0322 04/08/22 0539  NA 136 134* 135  K 3.6 4.0 3.2*  CL 99 94* 93*  CO2 29 28 31  $ GLUCOSE 101* 110* 125*  BUN 8 12 11  $ CREATININE 0.67 0.66 0.70  CALCIUM 8.4* 8.9 8.9  MG 1.9  --   --     GFR: CrCl cannot be calculated (Unknown ideal weight.).   Scheduled Meds:  acetaminophen  650 mg Oral QID   amLODipine  10 mg Oral Daily   busPIRone  30 mg Oral BID  calcium-vitamin D  2 tablet Oral BID WC   donepezil  10 mg Oral QHS   enoxaparin (LOVENOX) injection  40 mg Subcutaneous Q24H   famotidine  20 mg Oral BID   levothyroxine  100 mcg Oral Q0600   lidocaine  1 patch Transdermal Daily   LORazepam  0.5 mg Oral QHS   methocarbamol  500 mg Oral TID   oxyCODONE  10 mg Oral Q12H   pantoprazole  40 mg Oral BID   polyethylene glycol  17 g Oral Daily   senna-docusate  2 tablet Oral BID   sertraline  200 mg Oral  Daily      LOS: 5 days   Cherene Altes, MD [facility] Office  [phone] Pager - Text Page per Shea Evans  If 7PM-7AM, please contact night-coverage per Amion 04/08/2022, 7:43 AM

## 2022-04-09 DIAGNOSIS — S32012D Unstable burst fracture of first lumbar vertebra, subsequent encounter for fracture with routine healing: Secondary | ICD-10-CM | POA: Diagnosis not present

## 2022-04-09 LAB — CBC
HCT: 38.3 % (ref 36.0–46.0)
Hemoglobin: 13.8 g/dL (ref 12.0–15.0)
MCH: 30.2 pg (ref 26.0–34.0)
MCHC: 36 g/dL (ref 30.0–36.0)
MCV: 83.8 fL (ref 80.0–100.0)
Platelets: 157 10*3/uL (ref 150–400)
RBC: 4.57 MIL/uL (ref 3.87–5.11)
RDW: 13.2 % (ref 11.5–15.5)
WBC: 6.7 10*3/uL (ref 4.0–10.5)
nRBC: 0 % (ref 0.0–0.2)

## 2022-04-09 LAB — MAGNESIUM: Magnesium: 1.8 mg/dL (ref 1.7–2.4)

## 2022-04-09 LAB — BASIC METABOLIC PANEL
Anion gap: 9 (ref 5–15)
BUN: 11 mg/dL (ref 8–23)
CO2: 29 mmol/L (ref 22–32)
Calcium: 8.4 mg/dL — ABNORMAL LOW (ref 8.9–10.3)
Chloride: 98 mmol/L (ref 98–111)
Creatinine, Ser: 0.61 mg/dL (ref 0.44–1.00)
GFR, Estimated: >60 mL/min (ref >60)
Glucose, Bld: 116 mg/dL — ABNORMAL HIGH (ref 70–99)
Potassium: 3.4 mmol/L — ABNORMAL LOW (ref 3.5–5.1)
Sodium: 136 mmol/L (ref 135–145)

## 2022-04-09 MED ORDER — QUETIAPINE FUMARATE 25 MG PO TABS
12.5000 mg | ORAL_TABLET | Freq: Every day | ORAL | Status: DC
  Administered 2022-04-09 – 2022-04-17 (×9): 12.5 mg via ORAL
  Filled 2022-04-09 (×9): qty 1

## 2022-04-09 MED ORDER — FENTANYL CITRATE PF 50 MCG/ML IJ SOSY
12.5000 ug | PREFILLED_SYRINGE | INTRAMUSCULAR | Status: DC | PRN
  Administered 2022-04-10 – 2022-04-13 (×2): 25 ug via INTRAVENOUS
  Filled 2022-04-09 (×3): qty 1

## 2022-04-09 NOTE — Progress Notes (Signed)
Patient ID: Erika Conley, female   DOB: 06-12-1934, 87 y.o.   MRN: [id number] BP 125/65 (BP Location: Right Arm)   Pulse 73   Temp 98.1 F (36.7 C) (Oral)   Resp 16   SpO2 93%  On bedrest. Moving all extremities Remains uncomfortable with brace No new recommendations.

## 2022-04-09 NOTE — Progress Notes (Signed)
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  She immediately experienced significant low back pain following the fall.  In the ER she was found to have an unstable L1 compression fracture.  Consultants:  Neurosurgery  Goals of Care:  Code Status: DNR   DVT prophylaxis: Lovenox  Interim Hx: Afebrile.  Vital signs stable.  No acute events reported overnight.  Mildly delirious today.  Tells me her son "does not love me anymore and just wants my money."  States that she remains in uncontrolled pain.  No evidence of respiratory distress.  Assessment & Plan:  Unstable L1 vertebral fracture status post mechanical fall Neurosurgery directing care -  continue TLSO brace - avoid PT/OT for now under strict bedrest - surgical intervention would mean a thoracolumbar instrumentation and fusion, and she is felt to be a very poor candidate for this - after lengthy discussion with Neurosurgery the patient does not desire surgery and we will pursue conservative management with bracing and bedrest initially, followed by therapy - the high risk of either approach has been explained to the patient both by NS and me - transitioned back to IV fentanyl for severe pain - stopped oxycontin 2/17 - give trial of duragesic patch starting 2/19  HTN Follow without change - avoid overcorrection given advanced age - monitor without change for today  Hypothyroidism Continue usual Synthroid  Baseline mild dementia Continue Aricept  Anxiety/depression Continue usual doses of BuSpar and Ativan  Mild hyokalemia Supplement as needed and follow intermittently    Family Communication:  Disposition: From  ILF facility -is not presently able to participate in therapy due to the risk of spinal instability - will need prolonged bedrest as per Neurosurgery, which may necessitate her staying in the hospital if supportive care cannot be arranged as an outpatient    Objective: Blood pressure (!) 134/59, pulse 69, temperature 97.9 F (36.6 C), temperature source Oral, resp. rate 17, SpO2 97 %.  Intake/Output Summary (Last 24 hours) at 04/09/2022 0854 Last data filed at 04/09/2022 0419 Gross per 24 hour  Intake --  Output 575 ml  Net -575 ml    There were no vitals filed for this visit.  Examination: General: No acute respiratory distress -emotionally distraught Lungs: CTA B  Cardiovascular: RRR  Abdomen: NT/ND, soft, bowel sounds positive Extremities: No significant edema bilateral lower extremities  CBC: Recent Labs  Lab 04/03/22 0516 04/04/22 0325 04/05/22 0322 04/08/22 0539 04/09/22 0318  WBC 8.3 5.9 7.5 10.4 6.7  NEUTROABS 6.9 3.8  --   --   --   HGB 14.2 13.1 14.7 15.1* 13.8  HCT 41.7 38.3 42.1 41.8 38.3  MCV 86.5 86.5 84.4 83.6 83.8  PLT 133* 119* 136* 153 [id number]    Basic Metabolic Panel: Recent Labs  Lab 04/04/22 0325 04/05/22 0322 04/08/22 0539 04/09/22 0318  NA 136 134* 135 136  K 3.6 4.0 3.2* 3.4*  CL 99 94* 93* 98  CO2 29 28 31 29  $ GLUCOSE 101* 110* 125* 116*  BUN 8 12 11 11  $ CREATININE 0.67 0.66 0.70 0.61  CALCIUM 8.4* 8.9 8.9 8.4*  MG 1.9  --   --  1.8    GFR: CrCl cannot  be calculated (Unknown ideal weight.).   Scheduled Meds:  acetaminophen  650 mg Oral QID   amLODipine  10 mg Oral Daily   busPIRone  30 mg Oral BID   calcium-vitamin D  2 tablet Oral BID WC   donepezil  10 mg Oral QHS   enoxaparin (LOVENOX) injection  40 mg Subcutaneous Q24H   famotidine  20 mg Oral BID   levothyroxine  100 mcg Oral Q0600   lidocaine  1 patch Transdermal Daily   methocarbamol  500 mg Oral TID   pantoprazole  40 mg Oral BID   polyethylene glycol  17 g Oral  Daily   senna-docusate  2 tablet Oral BID   sertraline  200 mg Oral Daily      LOS: 6 days   Cherene Altes, MD [facility] Office  [phone] Pager - Text Page per Shea Evans  If 7PM-7AM, please contact night-coverage per Amion 04/09/2022, 8:54 AM

## 2022-04-10 DIAGNOSIS — S32012D Unstable burst fracture of first lumbar vertebra, subsequent encounter for fracture with routine healing: Secondary | ICD-10-CM | POA: Diagnosis not present

## 2022-04-10 MED ORDER — HYDROCODONE-ACETAMINOPHEN 5-325 MG PO TABS
1.0000 | ORAL_TABLET | ORAL | Status: DC | PRN
  Administered 2022-04-10: 1 via ORAL
  Administered 2022-04-10 – 2022-04-12 (×11): 2 via ORAL
  Administered 2022-04-13: 1 via ORAL
  Administered 2022-04-13: 2 via ORAL
  Administered 2022-04-13: 1 via ORAL
  Administered 2022-04-13 – 2022-04-16 (×9): 2 via ORAL
  Administered 2022-04-17 (×2): 1 via ORAL
  Administered 2022-04-17 (×3): 2 via ORAL
  Administered 2022-04-18: 1 via ORAL
  Administered 2022-04-18 (×2): 2 via ORAL
  Filled 2022-04-10 (×2): qty 2
  Filled 2022-04-10: qty 1
  Filled 2022-04-10 (×3): qty 2
  Filled 2022-04-10: qty 1
  Filled 2022-04-10 (×7): qty 2
  Filled 2022-04-10: qty 1
  Filled 2022-04-10 (×8): qty 2
  Filled 2022-04-10: qty 1
  Filled 2022-04-10 (×2): qty 2
  Filled 2022-04-10: qty 1
  Filled 2022-04-10 (×5): qty 2

## 2022-04-10 MED ORDER — FENTANYL 12 MCG/HR TD PT72
1.0000 | MEDICATED_PATCH | TRANSDERMAL | Status: DC
  Administered 2022-04-10 – 2022-04-13 (×2): 1 via TRANSDERMAL
  Filled 2022-04-10 (×2): qty 1

## 2022-04-10 NOTE — TOC Progression Note (Signed)
Transition of Care [facility]) - Progression Note    Patient Details  Name: Erika Conley MRN: [id number] Date of Birth: 02-Aug-1934  Transition of Care [facility]) CM/SW [place], [place] Phone Number: 04/10/2022, 2:40 PM  Clinical Narrative:   CSW discussed patient case with [facility], and patient will need to remain in house for duration of bed rest. CSW to follow.    Expected Discharge Plan: [place] Barriers to Discharge: Continued Medical Work up  Expected Discharge Plan and Services     Post Acute Care Choice: NA Living arrangements for the past 2 months: Assisted Living Facility                                       Social Determinants of Health (SDOH) Interventions SDOH Screenings   Tobacco Use: Low Risk  (10/13/2021)    Readmission Risk Interventions     No data to display

## 2022-04-10 NOTE — Progress Notes (Signed)
   Providing Compassionate, Quality Care - Together   Subjective: Patient reports she is still uncomfortable with the brace. Dr. Thereasa Solo is at the bedside.  Objective: Vital signs in last 24 hours: Temp:  [97.7 F (36.5 C)-98.6 F (37 C)] 98 F (36.7 C) (02/19 1107) Pulse Rate:  [68-81] 76 (02/19 1107) Resp:  [16-18] 16 (02/19 1107) BP: (136-154)/(59-73) 152/63 (02/19 1107) SpO2:  [90 %-98 %] 91 % (02/19 1107)  Intake/Output from previous day: 02/18 0701 - 02/19 0700 In: -  Out: 900 [Urine:900] Intake/Output this shift: No intake/output data recorded.  Alert and oriented x 3, mild confusion/forgetfullness HOH MAE, Strength and sensation intact   Lab Results: Recent Labs    04/08/22 0539 04/09/22 0318  WBC 10.4 6.7  HGB 15.1* 13.8  HCT 41.8 38.3  PLT 153 157   BMET Recent Labs    04/08/22 0539 04/09/22 0318  NA 135 136  K 3.2* 3.4*  CL 93* 98  CO2 31 29  GLUCOSE 125* 116*  BUN 11 11  CREATININE 0.70 0.61  CALCIUM 8.9 8.4*    Studies/Results: No results found.  Assessment/Plan: Patient sustained an L1 Chance fracture with spinal instability. Patient is a poor surgical candidate and agrees with conservative management for now.   LOS: 7 days   -Continue bedrest. -Possible standing trial with brace next week. -Dr. Thereasa Solo is adjusting the patient's pain medication. Patient requested Vicodin instead of oxycodone. He is also going to trial fentanyl patch.   Viona Gilmore, DNP, AGNP-C Nurse Practitioner  [facility] [place] [address], [address], [place], [place] [address] P: [phone]    F: [phone]  04/10/2022, 11:29 AM

## 2022-04-10 NOTE — Progress Notes (Signed)
Erika Conley  [id number] DOB: 02/03/1935 DOA: 04/03/2022 PCP: Pcp, No    Brief Narrative:  88 year old ILF [employer]) resident with a history of anxiety/depression, HTN, hypothyroidism, and fibromyalgia who ambulates with a cane at baseline but fell 2 nights prior to her presentation after simply losing her balance and falling into the tub/shower.  She immediately experienced significant low back pain following the fall.  In the ER she was found to have an unstable L1 compression fracture.  Consultants:  Neurosurgery  Goals of Care:  Code Status: DNR   DVT prophylaxis: Lovenox  Interim Hx: No acute events reported overnight.  Afebrile.  Vital signs stable.  Mildly delirious.  Able to answer basic questions.  Reports ongoing poorly controlled pain.  Denies chest pain or shortness of breath.  Assessment & Plan:  Unstable L1 vertebral fracture status post mechanical fall Neurosurgery directing care -  continue TLSO brace - avoid PT/OT for now under strict bedrest for at least remainder of this week - surgical intervention would mean a thoracolumbar instrumentation and fusion, and she is felt to be a very poor candidate for this - after lengthy discussion with Neurosurgery the patient does not desire surgery and we will pursue conservative management with bracing and bedrest initially, followed by therapy - the high risk of either approach has been explained to the patient both by NS and me - transitioned back to IV fentanyl for severe pain - stopped oxycontin 2/17 - give trial of duragesic patch starting 2/19 -patient swears that Vicodin is the best pain medication for her and therefore we will discontinue oxycodone and transition to a trial of Vicodin today  HTN Follow without change - avoid overcorrection given advanced age  Hypothyroidism Continue usual Synthroid  Baseline mild dementia Continue Aricept  Anxiety/depression Continue usual doses of BuSpar and Ativan  Mild  hyokalemia Supplement as needed and follow intermittently    Family Communication:  Disposition: From ILF facility - is not presently able to participate in therapy due to the risk of spinal instability - will need prolonged bedrest as per Neurosurgery, which may necessitate her staying in the hospital if supportive care cannot be arranged as an outpatient    Objective: Blood pressure 136/72, pulse 74, temperature 98.1 F (36.7 C), temperature source Oral, resp. rate 16, SpO2 98 %.  Intake/Output Summary (Last 24 hours) at 04/10/2022 0851 Last data filed at 04/09/2022 1744 Gross per 24 hour  Intake --  Output 900 ml  Net -900 ml    There were no vitals filed for this visit.  Examination: General: No acute respiratory distress - mildly delirious  Lungs: CTA B - no weezing  Cardiovascular: RRR  Abdomen: NT/ND, soft, bowel sounds positive Extremities: No significant edema B LE   CBC: Recent Labs  Lab 04/04/22 0325 04/05/22 0322 04/08/22 0539 04/09/22 0318  WBC 5.9 7.5 10.4 6.7  NEUTROABS 3.8  --   --   --   HGB 13.1 14.7 15.1* 13.8  HCT 38.3 42.1 41.8 38.3  MCV 86.5 84.4 83.6 83.8  PLT 119* 136* 153 [id number]    Basic Metabolic Panel: Recent Labs  Lab 04/04/22 0325 04/05/22 0322 04/08/22 0539 04/09/22 0318  NA 136 134* 135 136  K 3.6 4.0 3.2* 3.4*  CL 99 94* 93* 98  CO2 29 28 31 29  $ GLUCOSE 101* 110* 125* 116*  BUN 8 12 11 11  $ CREATININE 0.67 0.66 0.70 0.61  CALCIUM 8.4* 8.9 8.9 8.4*  MG 1.9  --   --  1.8    GFR: CrCl cannot be calculated (Unknown ideal weight.).   Scheduled Meds:  acetaminophen  650 mg Oral QID   amLODipine  10 mg Oral Daily   busPIRone  30 mg Oral BID   calcium-vitamin D  2 tablet Oral BID WC   donepezil  10 mg Oral QHS   enoxaparin (LOVENOX) injection  40 mg Subcutaneous Q24H   famotidine  20 mg Oral BID   levothyroxine  100 mcg Oral Q0600   lidocaine  1 patch Transdermal Daily   methocarbamol  500 mg Oral TID   pantoprazole  40 mg  Oral BID   polyethylene glycol  17 g Oral Daily   QUEtiapine  12.5 mg Oral QHS   senna-docusate  2 tablet Oral BID   sertraline  200 mg Oral Daily      LOS: 7 days   Cherene Altes, MD [facility] Office  [phone] Pager - Text Page per Shea Evans  If 7PM-7AM, please contact night-coverage per Amion 04/10/2022, 8:51 AM

## 2022-04-11 DIAGNOSIS — S32012G Unstable burst fracture of first lumbar vertebra, subsequent encounter for fracture with delayed healing: Secondary | ICD-10-CM | POA: Diagnosis not present

## 2022-04-11 LAB — BASIC METABOLIC PANEL
Anion gap: 7 (ref 5–15)
BUN: 10 mg/dL (ref 8–23)
CO2: 31 mmol/L (ref 22–32)
Calcium: 8.9 mg/dL (ref 8.9–10.3)
Chloride: 98 mmol/L (ref 98–111)
Creatinine, Ser: 0.78 mg/dL (ref 0.44–1.00)
GFR, Estimated: >60 mL/min (ref >60)
Glucose, Bld: 109 mg/dL — ABNORMAL HIGH (ref 70–99)
Potassium: 3.3 mmol/L — ABNORMAL LOW (ref 3.5–5.1)
Sodium: 136 mmol/L (ref 135–145)

## 2022-04-11 MED ORDER — DIPHENHYDRAMINE HCL 25 MG PO CAPS
25.0000 mg | ORAL_CAPSULE | Freq: Three times a day (TID) | ORAL | Status: DC | PRN
  Administered 2022-04-11 – 2022-04-18 (×9): 25 mg via ORAL
  Filled 2022-04-11 (×9): qty 1

## 2022-04-11 MED ORDER — LORAZEPAM 0.5 MG PO TABS
0.5000 mg | ORAL_TABLET | Freq: Two times a day (BID) | ORAL | Status: DC | PRN
  Administered 2022-04-11 – 2022-04-17 (×6): 0.5 mg via ORAL
  Filled 2022-04-11 (×6): qty 1

## 2022-04-11 MED ORDER — LORAZEPAM 0.5 MG PO TABS: 0.5000 mg | ORAL_TABLET | Freq: Two times a day (BID) | ORAL | Status: DC | PRN

## 2022-04-11 MED ORDER — POTASSIUM CHLORIDE CRYS ER 20 MEQ PO TBCR
40.0000 meq | EXTENDED_RELEASE_TABLET | Freq: Two times a day (BID) | ORAL | Status: AC
  Administered 2022-04-11 – 2022-04-12 (×3): 40 meq via ORAL
  Filled 2022-04-11 (×2): qty 2

## 2022-04-11 NOTE — Progress Notes (Signed)
Erika Conley  [id number] DOB: 07-Nov-1934 DOA: 04/03/2022 PCP: Pcp, No    Brief Narrative:  88 year old ILF [employer]) resident with a history of anxiety/depression, HTN, hypothyroidism, and fibromyalgia who ambulates with a cane at baseline but fell 2 nights prior to her presentation after simply losing her balance and falling into the tub/shower.  She immediately experienced significant low back pain following the fall.  In the ER she was found to have an unstable L1 compression fracture.  Consultants:  Neurosurgery  Goals of Care:  Code Status: DNR   DVT prophylaxis: Lovenox  Interim Hx: Afebrile.  Vital signs stable.  Oxygen saturation 94% room air.  Is more alert and conversant today.  Admits to me that she was very frustrated this morning.  Denies current chest pain.  Reports the pain is maybe slightly better controlled today.  Feels the Vicodin is an improvement.  Does report ongoing mild diffuse itching.   Assessment & Plan:  Unstable L1 vertebral fracture status post mechanical fall Neurosurgery directing care -  continue TLSO brace - avoid PT/OT for now under strict bedrest for at least remainder of this week - surgical intervention would mean a thoracolumbar instrumentation and fusion, and she is felt to be a very poor candidate for this - after lengthy discussion with Neurosurgery the patient does not desire surgery and we will pursue conservative management with bracing and bedrest initially, followed by therapy - the high risk of either approach has been explained to the patient both by NS and me - cont IV fentanyl for severe pain - stopped oxycontin 2/17 as she felt it was not helping - started trial of duragesic patch starting 2/19 for consistent control - continue Vicodin   HTN Follow without change - avoid overcorrection given advanced age  Hypothyroidism Continue usual Synthroid  Baseline mild dementia Continue Aricept  Anxiety/depression Continue usual  doses of BuSpar and Ativan  Mild hyokalemia Cont to supplement as needed and follow intermittently    Family Communication:  Disposition: From ILF facility - is not presently able to participate in therapy due to the risk of spinal instability - will need prolonged bedrest as per Neurosurgery, which may necessitate her staying in the hospital if supportive care cannot be arranged as an outpatient    Objective: Blood pressure (!) 147/59, pulse 82, temperature 98.8 F (37.1 C), temperature source Oral, resp. rate 16, SpO2 94 %. No intake or output data in the 24 hours ending 04/11/22 0941  There were no vitals filed for this visit.  Examination: General: No acute respiratory distress - more alert today  Lungs: CTA B - no weezing  Cardiovascular: RRR  Abdomen: NT/ND, soft, bowel sounds positive Extremities: No significant edema B LE   CBC: Recent Labs  Lab 04/05/22 0322 04/08/22 0539 04/09/22 0318  WBC 7.5 10.4 6.7  HGB 14.7 15.1* 13.8  HCT 42.1 41.8 38.3  MCV 84.4 83.6 83.8  PLT 136* 153 [id number]    Basic Metabolic Panel: Recent Labs  Lab 04/08/22 0539 04/09/22 0318 04/11/22 0345  NA 135 136 136  K 3.2* 3.4* 3.3*  CL 93* 98 98  CO2 31 29 31  $ GLUCOSE 125* 116* 109*  BUN 11 11 10  $ CREATININE 0.70 0.61 0.78  CALCIUM 8.9 8.4* 8.9  MG  --  1.8  --     GFR: CrCl cannot be calculated (Unknown ideal weight.).   Scheduled Meds:  amLODipine  10 mg Oral Daily   busPIRone  30  mg Oral BID   calcium-vitamin D  2 tablet Oral BID WC   donepezil  10 mg Oral QHS   enoxaparin (LOVENOX) injection  40 mg Subcutaneous Q24H   famotidine  20 mg Oral BID   fentaNYL  1 patch Transdermal Q72H   levothyroxine  100 mcg Oral Q0600   lidocaine  1 patch Transdermal Daily   methocarbamol  500 mg Oral TID   pantoprazole  40 mg Oral BID   polyethylene glycol  17 g Oral Daily   QUEtiapine  12.5 mg Oral QHS   senna-docusate  2 tablet Oral BID   sertraline  200 mg Oral Daily      LOS:  8 days   Cherene Altes, MD [facility] Office  [phone] Pager - Text Page per Shea Evans  If 7PM-7AM, please contact night-coverage per Amion 04/11/2022, 9:41 AM

## 2022-04-11 NOTE — Progress Notes (Signed)
Subjective: The patient is alert.  She is unhappy about her care here at the hospital.  She hurts all over.  Objective: Vital signs in last 24 hours: Temp:  [98 F (36.7 C)-98.8 F (37.1 C)] 98.5 F (36.9 C) (02/20 0407) Pulse Rate:  [71-76] 71 (02/20 0407) Resp:  [16-18] 18 (02/19 2043) BP: (131-181)/(63-79) 156/77 (02/20 0407) SpO2:  [91 %-97 %] 93 % (02/20 0407) Estimated body mass index is 26.17 kg/m as calculated from the following:   Height as of 02/14/21: 5' (1.524 m).   Weight as of 02/14/21: 60.8 kg.   Intake/Output from previous day: No intake/output data recorded. Intake/Output this shift: No intake/output data recorded.  Physical exam the patient is alert.  Her strength is normal.  Lab Results: Recent Labs    04/09/22 0318  WBC 6.7  HGB 13.8  HCT 38.3  PLT 157   BMET Recent Labs    04/09/22 0318 04/11/22 0345  NA 136 136  K 3.4* 3.3*  CL 98 98  CO2 29 31  GLUCOSE 116* 109*  BUN 11 10  CREATININE 0.61 0.78  CALCIUM 8.4* 8.9    Studies/Results: No results found.  Assessment/Plan: L1 Chance fracture: I have again discussed the situation with the patient.  Our options are prolonged bedrest versus surgery.  I have answered all her questions.  I directed her complaints to the charge nurse.  LOS: 8 days     Erika Conley 04/11/2022, 7:54 AM     Patient ID: Erika Conley, female   DOB: [birth year], 88 y.o.   MRN: [id number]

## 2022-04-11 NOTE — Plan of Care (Signed)
  Problem: Education: Goal: Knowledge of General Education information will improve Description: Including pain rating scale, medication(s)/side effects and non-pharmacologic comfort measures Outcome: Progressing   Problem: Nutrition: Goal: Adequate nutrition will be maintained Outcome: Progressing   Problem: Elimination: Goal: Will not experience complications related to bowel motility Outcome: Progressing Goal: Will not experience complications related to urinary retention Outcome: Progressing   Problem: Health Behavior/Discharge Planning: Goal: Ability to manage health-related needs will improve Outcome: Not Progressing   Problem: Coping: Goal: Level of anxiety will decrease Outcome: Not Progressing

## 2022-04-12 DIAGNOSIS — S32012G Unstable burst fracture of first lumbar vertebra, subsequent encounter for fracture with delayed healing: Secondary | ICD-10-CM | POA: Diagnosis not present

## 2022-04-12 MED ORDER — METOPROLOL TARTRATE 12.5 MG HALF TABLET
12.5000 mg | ORAL_TABLET | Freq: Two times a day (BID) | ORAL | Status: DC
  Administered 2022-04-12: 12.5 mg via ORAL
  Filled 2022-04-12: qty 1

## 2022-04-12 MED ORDER — METOPROLOL TARTRATE 12.5 MG HALF TABLET
12.5000 mg | ORAL_TABLET | Freq: Two times a day (BID) | ORAL | Status: DC
  Administered 2022-04-12 – 2022-04-18 (×12): 12.5 mg via ORAL
  Filled 2022-04-12 (×12): qty 1

## 2022-04-12 NOTE — Plan of Care (Signed)
  Problem: Education: Goal: Knowledge of General Education information will improve Description: Including pain rating scale, medication(s)/side effects and non-pharmacologic comfort measures Outcome: Progressing   Problem: Nutrition: Goal: Adequate nutrition will be maintained Outcome: Progressing   Problem: Health Behavior/Discharge Planning: Goal: Ability to manage health-related needs will improve Outcome: Not Progressing   Problem: Coping: Goal: Level of anxiety will decrease Outcome: Not Progressing   Problem: Pain Managment: Goal: General experience of comfort will improve Outcome: Not Progressing

## 2022-04-12 NOTE — Progress Notes (Addendum)
Erika Conley  [id number] DOB: [birth year] DOA: 04/03/2022 PCP: Pcp, No    Brief Narrative:  88 year old ILF [employer]) resident with a history of anxiety/depression, HTN, hypothyroidism, and fibromyalgia who ambulates with a cane at baseline but fell 2 nights prior to her presentation after simply losing her balance and falling into the tub/shower.  She immediately experienced significant low back pain following the fall.  In the ER she was found to have an unstable L1 compression fracture.  Consultants:  Neurosurgery  Goals of Care:  Code Status: DNR   DVT prophylaxis: Lovenox  Interim Hx: Afebrile.  Vital signs stable.  Oxygen saturation 94% room air.  Is more alert and conversant today.  Admits to me that she was very frustrated this morning.  Denies current chest pain.  Reports the pain is maybe slightly better controlled today.  Feels the Vicodin is an improvement.  Does report ongoing mild diffuse itching.   Assessment & Plan:  Unstable L1 vertebral fracture status post mechanical fall Neurosurgery directing care -  continue TLSO brace - avoid PT/OT for now under strict bedrest for at least remainder of this week - surgical intervention would mean a thoracolumbar instrumentation and fusion, and she is felt to be a very poor candidate for this - after lengthy discussion with Neurosurgery the patient does not desire surgery and we will pursue conservative management with bracing and bedrest initially, followed by therapy - the high risk of either approach has been explained to the patient both by NS and me - cont IV fentanyl for severe pain - stopped oxycontin 2/17 as she felt it was not helping - started trial of duragesic patch starting 2/19 for consistent control - continue Vicodin   HTN Follow without change - avoid overcorrection given advanced age  Hypothyroidism Continue usual Synthroid  Baseline mild dementia Continue Aricept  Anxiety/depression Continue usual  doses of BuSpar and Ativan  Mild hyokalemia Cont to supplement as needed and follow intermittently    Family Communication: None present Disposition: From ILF facility - is not presently able to participate in therapy due to the risk of spinal instability - will need prolonged bedrest as per Neurosurgery, which may necessitate her staying in the hospital if supportive care cannot be arranged as an outpatient    Objective: Blood pressure (!) 164/72, pulse 71, temperature 98.2 F (36.8 C), temperature source Oral, resp. rate 17, SpO2 96 %.  Intake/Output Summary (Last 24 hours) at 04/12/2022 0749 Last data filed at 04/12/2022 0320 Gross per 24 hour  Intake 240 ml  Output 800 ml  Net -560 ml    There were no vitals filed for this visit.  Examination: General: No acute respiratory distress - more alert today  Lungs: CTA B - no weezing  Cardiovascular: RRR  Abdomen: NT/ND, soft, bowel sounds positive Extremities: No significant edema B LE   CBC: Recent Labs  Lab 04/08/22 0539 04/09/22 0318  WBC 10.4 6.7  HGB 15.1* 13.8  HCT 41.8 38.3  MCV 83.6 83.8  PLT 153 [id number]    Basic Metabolic Panel: Recent Labs  Lab 04/08/22 0539 04/09/22 0318 04/11/22 0345  NA 135 136 136  K 3.2* 3.4* 3.3*  CL 93* 98 98  CO2 31 29 31  $ GLUCOSE 125* 116* 109*  BUN 11 11 10  $ CREATININE 0.70 0.61 0.78  CALCIUM 8.9 8.4* 8.9  MG  --  1.8  --     GFR: CrCl cannot be calculated (Unknown ideal weight.).  Scheduled Meds:  amLODipine  10 mg Oral Daily   busPIRone  30 mg Oral BID   calcium-vitamin D  2 tablet Oral BID WC   donepezil  10 mg Oral QHS   enoxaparin (LOVENOX) injection  40 mg Subcutaneous Q24H   famotidine  20 mg Oral BID   fentaNYL  1 patch Transdermal Q72H   levothyroxine  100 mcg Oral Q0600   lidocaine  1 patch Transdermal Daily   methocarbamol  500 mg Oral TID   pantoprazole  40 mg Oral BID   polyethylene glycol  17 g Oral Daily   potassium chloride  40 mEq Oral BID    QUEtiapine  12.5 mg Oral QHS   senna-docusate  2 tablet Oral BID   sertraline  200 mg Oral Daily      LOS: 9 days   Little Ishikawa, DO [facility]  If 7PM-7AM, please contact night-coverage - [email or web address] 04/12/2022, 7:49 AM

## 2022-04-12 NOTE — Progress Notes (Signed)
   Providing Compassionate, Quality Care - Together   Subjective: Patient reports no new issues. She is resting comfortably on assessment.  Objective: Vital signs in last 24 hours: Temp:  [98.2 F (36.8 C)-99.3 F (37.4 C)] 99.3 F (37.4 C) (02/21 1120) Pulse Rate:  [71-88] 88 (02/21 1120) Resp:  [14-17] 16 (02/21 1120) BP: (144-164)/(51-72) 160/51 (02/21 1120) SpO2:  [91 %-96 %] 94 % (02/21 1120)  Intake/Output from previous day: 02/20 0701 - 02/21 0700 In: 240 [P.O.:240] Out: 800 [Urine:800] Intake/Output this shift: Total I/O In: 236 [P.O.:236] Out: -   Responds to voice and oriented x 3, mild confusion/forgetfulness HOH MAE, Strength and sensation intact   Lab Results: No results for input(s): "WBC", "HGB", "HCT", "PLT" in the last 72 hours. BMET Recent Labs    04/11/22 0345  NA 136  K 3.3*  CL 98  CO2 31  GLUCOSE 109*  BUN 10  CREATININE 0.78  CALCIUM 8.9    Studies/Results: No results found.  Assessment/Plan: Patient sustained an L1 Chance fracture with spinal instability. Patient is a poor surgical candidate and agrees with conservative management for now.    LOS: 9 days   -Continue bedrest. -Possible standing trial with brace next week.   Viona Gilmore, DNP, AGNP-C Nurse Practitioner  [facility] [place] [address], [place], [place] [address] P: [phone]    F: [phone]  04/12/2022, 2:53 PM

## 2022-04-13 DIAGNOSIS — S32012G Unstable burst fracture of first lumbar vertebra, subsequent encounter for fracture with delayed healing: Secondary | ICD-10-CM | POA: Diagnosis not present

## 2022-04-13 NOTE — Progress Notes (Signed)
Erika Conley  [id number] DOB: 02-Nov-1934 DOA: 04/03/2022 PCP: Pcp, No    Brief Narrative:  88 year old ILF [employer]) resident with a history of anxiety/depression, HTN, hypothyroidism, and fibromyalgia who ambulates with a cane at baseline but fell 2 nights prior to her presentation after simply losing her balance and falling into the tub/shower.  She immediately experienced significant low back pain following the fall.  In the ER she was found to have an unstable L1 compression fracture.  Consultants:  Neurosurgery  Goals of Care:  Code Status: DNR   DVT prophylaxis: Lovenox  Interim Hx: No acute issues or events overnight, reports ongoing discomfort with brace but we discussed the importance of the brace given her fracture as below concerning for further injury should she remove the brace or attempt a get out of bed.  She voices frustration for being "stuck here" which we understand but again explained the reasoning for long-term monitoring per neurosurgery.  Assessment & Plan:  Unstable L1 vertebral fracture status post mechanical fall Neurosurgery directing care -  continue TLSO brace - avoid PT/OT for now under strict bedrest for at least remainder of this week - surgical intervention would mean a thoracolumbar instrumentation and fusion, and she is felt to be a very poor candidate for this - after lengthy discussion with Neurosurgery the patient does not desire surgery and we will pursue conservative management with bracing and bedrest initially, followed by therapy - the high risk of either approach has been explained to the patient both by NS and medical team. -Pain currently well-controlled on current regimen including fentanyl patch, lidocaine patch as well as as needed IV fentanyl and p.o. hydrocodone  HTN Essentially stable, currently within normal limits (previously elevated in the setting of pain)  Hypothyroidism Continue usual Synthroid  Baseline mild  dementia Continue Aricept  Anxiety/depression Continue usual doses of BuSpar and Ativan  Mild hyokalemia Cont to supplement as needed and follow intermittently   Family Communication: None present Disposition: From ILF facility - is not presently able to participate in therapy due to the risk of spinal instability - will need prolonged bedrest as per Neurosurgery, which may necessitate her staying in the hospital if supportive care cannot be arranged as an outpatient   Objective: Blood pressure 139/80, pulse 74, temperature 98.6 F (37 C), temperature source Oral, resp. rate 19, SpO2 94 %.  Intake/Output Summary (Last 24 hours) at 04/13/2022 0743 Last data filed at 04/12/2022 2105 Gross per 24 hour  Intake 326 ml  Output 200 ml  Net 126 ml    There were no vitals filed for this visit.  Examination: General: No acute respiratory distress - more alert today  Lungs: Without overt wheeze, difficult to auscultate through bracing Cardiovascular: Without JVD or carotid bruit, pulse regular Abdomen: NT/ND, soft, bowel sounds positive Extremities: No significant edema B LE   CBC: Recent Labs  Lab 04/08/22 0539 04/09/22 0318  WBC 10.4 6.7  HGB 15.1* 13.8  HCT 41.8 38.3  MCV 83.6 83.8  PLT 153 [id number]    Basic Metabolic Panel: Recent Labs  Lab 04/08/22 0539 04/09/22 0318 04/11/22 0345  NA 135 136 136  K 3.2* 3.4* 3.3*  CL 93* 98 98  CO2 31 29 31  $ GLUCOSE 125* 116* 109*  BUN 11 11 10  $ CREATININE 0.70 0.61 0.78  CALCIUM 8.9 8.4* 8.9  MG  --  1.8  --     GFR: CrCl cannot be calculated (Unknown ideal weight.).  Scheduled Meds:  amLODipine  10 mg Oral Daily   busPIRone  30 mg Oral BID   calcium-vitamin D  2 tablet Oral BID WC   donepezil  10 mg Oral QHS   enoxaparin (LOVENOX) injection  40 mg Subcutaneous Q24H   famotidine  20 mg Oral BID   fentaNYL  1 patch Transdermal Q72H   levothyroxine  100 mcg Oral Q0600   lidocaine  1 patch Transdermal Daily    methocarbamol  500 mg Oral TID   metoprolol tartrate  12.5 mg Oral BID   pantoprazole  40 mg Oral BID   polyethylene glycol  17 g Oral Daily   QUEtiapine  12.5 mg Oral QHS   senna-docusate  2 tablet Oral BID   sertraline  200 mg Oral Daily      LOS: 10 days   Little Ishikawa, DO [facility]  If 7PM-7AM, please contact night-coverage - [email or web address] 04/13/2022, 7:43 AM

## 2022-04-13 NOTE — Plan of Care (Signed)
Pt slept most of the night tonight. Was given a muscle relaxer with pain meds at [facility] which she said helped her relax. Pt turning self slightly from side to side without issues.

## 2022-04-14 DIAGNOSIS — S32012G Unstable burst fracture of first lumbar vertebra, subsequent encounter for fracture with delayed healing: Secondary | ICD-10-CM | POA: Diagnosis not present

## 2022-04-14 LAB — CBC
HCT: 45.1 % (ref 36.0–46.0)
Hemoglobin: 16.3 g/dL — ABNORMAL HIGH (ref 12.0–15.0)
MCH: 30.4 pg (ref 26.0–34.0)
MCHC: 36.1 g/dL — ABNORMAL HIGH (ref 30.0–36.0)
MCV: 84 fL (ref 80.0–100.0)
Platelets: 297 10*3/uL (ref 150–400)
RBC: 5.37 MIL/uL — ABNORMAL HIGH (ref 3.87–5.11)
RDW: 13.7 % (ref 11.5–15.5)
WBC: 8.7 10*3/uL (ref 4.0–10.5)
nRBC: 0 % (ref 0.0–0.2)

## 2022-04-14 LAB — BASIC METABOLIC PANEL
Anion gap: 8 (ref 5–15)
BUN: 14 mg/dL (ref 8–23)
CO2: 26 mmol/L (ref 22–32)
Calcium: 9.2 mg/dL (ref 8.9–10.3)
Chloride: 98 mmol/L (ref 98–111)
Creatinine, Ser: 0.93 mg/dL (ref 0.44–1.00)
GFR, Estimated: >60 mL/min (ref >60)
Glucose, Bld: 129 mg/dL — ABNORMAL HIGH (ref 70–99)
Potassium: 3.6 mmol/L (ref 3.5–5.1)
Sodium: 132 mmol/L — ABNORMAL LOW (ref 135–145)

## 2022-04-14 MED ORDER — ACETAMINOPHEN 325 MG PO TABS
325.0000 mg | ORAL_TABLET | ORAL | Status: DC | PRN
  Administered 2022-04-14 – 2022-04-16 (×2): 325 mg via ORAL
  Filled 2022-04-14 (×2): qty 1

## 2022-04-14 NOTE — Progress Notes (Signed)
Erika Conley  [id number] DOB: Nov 07, 1934 DOA: 04/03/2022 PCP: Pcp, No    Brief Narrative:  88 year old ILF [employer]) resident with a history of anxiety/depression, HTN, hypothyroidism, and fibromyalgia who ambulates with a cane at baseline but fell 2 nights prior to her presentation after simply losing her balance and falling into the tub/shower.  She immediately experienced significant low back pain following the fall.  In the ER she was found to have an unstable L1 compression fracture.  Consultants:  Neurosurgery  Goals of Care:  Code Status: DNR   DVT prophylaxis: Lovenox  Interim Hx: No acute issues or events overnight, reports ongoing discomfort with brace but we discussed the importance of the brace given her fracture as below concerning for further injury should she remove the brace or attempt a get out of bed.  She voices frustration for being "stuck here" which we understand but again explained the reasoning for long-term monitoring per neurosurgery.  Assessment & Plan:  Unstable L1 vertebral fracture status post mechanical fall Neurosurgery directing care -  continue TLSO brace - avoid PT/OT for now under strict bedrest for at least remainder of this week - surgical intervention would mean a thoracolumbar instrumentation and fusion, and she is felt to be a very poor candidate for this - after lengthy discussion with Neurosurgery the patient does not desire surgery and we will pursue conservative management with bracing and bedrest initially, followed by therapy - the high risk of either approach has been explained to the patient both by NS and medical team. -Pain currently well-controlled on current regimen including fentanyl patch, lidocaine patch as well as as needed IV fentanyl and p.o. hydrocodone  HTN Essentially stable, currently within normal limits (previously elevated in the setting of pain)  Hypothyroidism Continue usual Synthroid  Baseline mild  dementia Continue Aricept  Anxiety/depression Continue usual doses of BuSpar and Ativan  Mild hyokalemia Cont to supplement as needed and follow intermittently   Family Communication: None present Disposition: From ILF facility - is not presently able to participate in therapy due to the risk of spinal instability - will need prolonged bedrest as per Neurosurgery, which may necessitate her staying in the hospital if supportive care cannot be arranged as an outpatient   Objective: Blood pressure (!) 148/76, pulse 61, temperature 98 F (36.7 C), temperature source Oral, resp. rate 16, SpO2 95 %. No intake or output data in the 24 hours ending 04/14/22 0803  There were no vitals filed for this visit.  Examination: General: No acute respiratory distress - more alert today  Lungs: Without overt wheeze, difficult to auscultate through bracing Cardiovascular: Without JVD or carotid bruit, pulse regular Abdomen: NT/ND, soft, bowel sounds positive Extremities: No significant edema B LE   CBC: Recent Labs  Lab 04/08/22 0539 04/09/22 0318 04/14/22 0554  WBC 10.4 6.7 8.7  HGB 15.1* 13.8 16.3*  HCT 41.8 38.3 45.1  MCV 83.6 83.8 84.0  PLT 153 157 123XX123    Basic Metabolic Panel: Recent Labs  Lab 04/09/22 0318 04/11/22 0345 04/14/22 0554  NA 136 136 132*  K 3.4* 3.3* 3.6  CL 98 98 98  CO2 '29 31 26  '$ GLUCOSE 116* 109* 129*  BUN '11 10 14  '$ CREATININE 0.61 0.78 0.93  CALCIUM 8.4* 8.9 9.2  MG 1.8  --   --     GFR: CrCl cannot be calculated (Unknown ideal weight.).   Scheduled Meds:  amLODipine  10 mg Oral Daily   busPIRone  30 mg Oral BID   calcium-vitamin D  2 tablet Oral BID WC   donepezil  10 mg Oral QHS   enoxaparin (LOVENOX) injection  40 mg Subcutaneous Q24H   famotidine  20 mg Oral BID   fentaNYL  1 patch Transdermal Q72H   levothyroxine  100 mcg Oral Q0600   lidocaine  1 patch Transdermal Daily   methocarbamol  500 mg Oral TID   metoprolol tartrate  12.5 mg Oral  BID   pantoprazole  40 mg Oral BID   polyethylene glycol  17 g Oral Daily   QUEtiapine  12.5 mg Oral QHS   senna-docusate  2 tablet Oral BID   sertraline  200 mg Oral Daily      LOS: 11 days   Little Ishikawa, DO [facility]  If 7PM-7AM, please contact night-coverage - [email or web address] 04/14/2022, 8:03 AM

## 2022-04-14 NOTE — Progress Notes (Signed)
Subjective: The patient is alert and pleasant.  She looks and feels better.  Objective: Vital signs in last 24 hours: Temp:  [97.6 F (36.4 C)-98.6 F (37 C)] 98 F (36.7 C) (02/23 0747) Pulse Rate:  [61-78] 61 (02/23 0747) Resp:  [16-20] 16 (02/23 0747) BP: (133-148)/(64-84) 148/76 (02/23 0747) SpO2:  [91 %-98 %] 95 % (02/23 0747) Estimated body mass index is 26.17 kg/m as calculated from the following:   Height as of 02/14/21: 5' (1.524 m).   Weight as of 02/14/21: 60.8 kg.   Intake/Output from previous day: No intake/output data recorded. Intake/Output this shift: No intake/output data recorded.  Physical exam the patient is alert and oriented.  Her lower extremity strength is normal.  Lab Results: Recent Labs    04/14/22 0554  WBC 8.7  HGB 16.3*  HCT 45.1  PLT 297   BMET Recent Labs    04/14/22 0554  NA 132*  K 3.6  CL 98  CO2 26  GLUCOSE 129*  BUN 14  CREATININE 0.93  CALCIUM 9.2    Studies/Results: No results found.  Assessment/Plan: L1 Chance fracture, osteoporosis, status post multiple vertebroplasty's: I have discussed situation with the patient.  Her choices are prolonged bedrest versus surgery.  She wants to stay the course with prolonged bedrest.  We will try to sit her up next week.  LOS: 11 days     [name] 04/14/2022, 9:38 AM     Patient ID: Erika Conley, female   DOB: 1934/11/10, 88 y.o.   MRN: [id number]

## 2022-04-14 NOTE — Plan of Care (Signed)
Pt has been calm and cooperative the last 2 nights. Pt tired of staying in bed and in the hospital but knows she needs to do it and wear the back brace so her back may heal. Pt c/o itching at the beginning of the shift and was given po Benadryl. Pt encouraged to drink plenty of water to help prevent a UTI. Pt moves self around in the bed. Turns slightly from side to side.

## 2022-04-15 DIAGNOSIS — S32012G Unstable burst fracture of first lumbar vertebra, subsequent encounter for fracture with delayed healing: Secondary | ICD-10-CM | POA: Diagnosis not present

## 2022-04-15 NOTE — Progress Notes (Signed)
Erika Conley  [id number] DOB: 08-Feb-1935 DOA: 04/03/2022 PCP: Pcp, No    Brief Narrative:  88 year old ILF [employer]) resident with a history of anxiety/depression, HTN, hypothyroidism, and fibromyalgia who ambulates with a cane at baseline but fell 2 nights prior to her presentation after simply losing her balance and falling into the tub/shower.  She immediately experienced significant low back pain following the fall.  In the ER she was found to have an unstable L1 compression fracture.  Consultants:  Neurosurgery  Goals of Care:  Code Status: DNR   DVT prophylaxis: Lovenox  Interim Hx: No acute issues or events overnight, patient reports mild episodes of diarrhea overnight, complains this is likely secondary to her diet -she does not tolerate fat or acidic foods well.  Otherwise patient states her pain is improving, excited to attempt increased mobility per neurosurgery discussion.  Assessment & Plan:  Unstable L1 vertebral fracture status post mechanical fall Neurosurgery directing care -  continue TLSO brace - avoid PT/OT for now under strict bedrest for at least remainder of this week - surgical intervention would mean a thoracolumbar instrumentation and fusion, and she is felt to be a very poor candidate for this - after lengthy discussion with Neurosurgery the patient does not desire surgery and we will pursue conservative management with bracing and bedrest initially, followed by therapy - the high risk of either approach has been explained to the patient both by NS and medical team. -Pain currently well-controlled on current regimen including fentanyl patch, lidocaine patch as well as as needed IV fentanyl and p.o. hydrocodone -Tentative plan to attempt mobility as early as next week (2/26-onward) - if stable could consider increasing PT as tolerated. If attempted mobility goes poorly Neurosurgey indicates she may require an additional 1-2 weeks of bedbound  status.  HTN Essentially stable, currently within normal limits (previously elevated in the setting of pain)  Hypothyroidism Continue usual Synthroid  Baseline mild dementia Continue Aricept  Anxiety/depression Continue usual doses of BuSpar and Ativan  GERD, chronic  Diarrhea, mild  -Continue supportive care, have removed auto ordered meal-tray and will defer to patient to order her own meals moving forward  Mild hyokalemia Cont to supplement as needed and follow intermittently   Family Communication: None present Disposition: From ILF facility - is not presently able to participate in therapy due to the risk of spinal instability - will need prolonged bedrest as per Neurosurgery, which may necessitate a prolonged stay in the hospital if supportive care cannot be arranged as an outpatient   Objective: Blood pressure (!) 153/69, pulse (!) 57, temperature 98 F (36.7 C), temperature source Oral, resp. rate 16, SpO2 95 %.  Intake/Output Summary (Last 24 hours) at 04/15/2022 0750 Last data filed at 04/14/2022 2336 Gross per 24 hour  Intake --  Output 400 ml  Net -400 ml    There were no vitals filed for this visit.  Examination: General: No acute respiratory distress - more alert today  Lungs: Without overt wheeze, difficult to auscultate through bracing Cardiovascular: Without JVD or carotid bruit, pulse regular Abdomen: NT/ND, soft, bowel sounds positive Extremities: No significant edema B LE   CBC: Recent Labs  Lab 04/09/22 0318 04/14/22 0554  WBC 6.7 8.7  HGB 13.8 16.3*  HCT 38.3 45.1  MCV 83.8 84.0  PLT 157 123XX123    Basic Metabolic Panel: Recent Labs  Lab 04/09/22 0318 04/11/22 0345 04/14/22 0554  NA 136 136 132*  K 3.4* 3.3* 3.6  CL 98 98 98  CO2 '29 31 26  '$ GLUCOSE 116* 109* 129*  BUN '11 10 14  '$ CREATININE 0.61 0.78 0.93  CALCIUM 8.4* 8.9 9.2  MG 1.8  --   --     GFR: CrCl cannot be calculated (Unknown ideal weight.).   Scheduled Meds:   amLODipine  10 mg Oral Daily   busPIRone  30 mg Oral BID   calcium-vitamin D  2 tablet Oral BID WC   donepezil  10 mg Oral QHS   enoxaparin (LOVENOX) injection  40 mg Subcutaneous Q24H   famotidine  20 mg Oral BID   fentaNYL  1 patch Transdermal Q72H   levothyroxine  100 mcg Oral Q0600   lidocaine  1 patch Transdermal Daily   methocarbamol  500 mg Oral TID   metoprolol tartrate  12.5 mg Oral BID   pantoprazole  40 mg Oral BID   polyethylene glycol  17 g Oral Daily   QUEtiapine  12.5 mg Oral QHS   senna-docusate  2 tablet Oral BID   sertraline  200 mg Oral Daily      LOS: 12 days   Little Ishikawa, DO [facility]  If 7PM-7AM, please contact night-coverage - [email or web address] 04/15/2022, 7:50 AM

## 2022-04-15 NOTE — Progress Notes (Signed)
   Providing Compassionate, Quality Care - Together   Subjective: Patient reports her pain is much improved.  Objective: Vital signs in last 24 hours: Temp:  [97.8 F (36.6 C)-98.9 F (37.2 C)] 98.9 F (37.2 C) (02/24 0755) Pulse Rate:  [53-62] 62 (02/24 0755) Resp:  [16] 16 (02/24 0755) BP: (120-155)/(67-90) 155/67 (02/24 0755) SpO2:  [94 %-95 %] 94 % (02/24 0755)  Intake/Output from previous day: 02/23 0701 - 02/24 0700 In: -  Out: 400 [Urine:400] Intake/Output this shift: No intake/output data recorded.   Alert and oriented x 4 HOH MAE, Strength and sensation intact  Lab Results: Recent Labs    04/14/22 0554  WBC 8.7  HGB 16.3*  HCT 45.1  PLT 297   BMET Recent Labs    04/14/22 0554  NA 132*  K 3.6  CL 98  CO2 26  GLUCOSE 129*  BUN 14  CREATININE 0.93  CALCIUM 9.2    Studies/Results: No results found.  Assessment/Plan: Patient sustained an L1 Chance fracture with spinal instability. Patient is a poor surgical candidate and agrees with conservative management with bracing.   LOS: 12 days   -Continue bedrest and bracing -Will trial sitting her up next week.   Viona Gilmore, DNP, AGNP-C Nurse Practitioner  [facility] [place] [address], [place], [place], [place] [address] P: [phone]    F: [phone]  04/15/2022, 10:32 AM

## 2022-04-16 DIAGNOSIS — S32012G Unstable burst fracture of first lumbar vertebra, subsequent encounter for fracture with delayed healing: Secondary | ICD-10-CM | POA: Diagnosis not present

## 2022-04-16 DIAGNOSIS — L8990 Pressure ulcer of unspecified site, unspecified stage: Secondary | ICD-10-CM | POA: Insufficient documentation

## 2022-04-16 MED ORDER — LOPERAMIDE HCL 2 MG PO CAPS
2.0000 mg | ORAL_CAPSULE | ORAL | Status: DC | PRN
  Administered 2022-04-16 – 2022-04-17 (×2): 2 mg via ORAL
  Filled 2022-04-16 (×2): qty 1

## 2022-04-16 MED ORDER — HYDROMORPHONE HCL 1 MG/ML IJ SOLN
0.5000 mg | INTRAMUSCULAR | Status: DC | PRN
  Administered 2022-04-16: 0.5 mg via INTRAVENOUS
  Filled 2022-04-16: qty 0.5

## 2022-04-16 NOTE — Progress Notes (Signed)
Erika Conley  [id number] DOB: 1934-09-30 DOA: 04/03/2022 PCP: Pcp, No    Brief Narrative:  88 year old ILF [employer]) resident with a history of anxiety/depression, HTN, hypothyroidism, and fibromyalgia who ambulates with a cane at baseline but fell 2 nights prior to her presentation after simply losing her balance and falling into the tub/shower.  She immediately experienced significant low back pain following the fall.  In the ER she was found to have an unstable L1 compression fracture.  Consultants:  Neurosurgery  Goals of Care:  Code Status: DNR   DVT prophylaxis: Lovenox  Interim Hx: No acute issues or events overnight, patient reports ongoing diarrhea in the setting of irritable bowel syndrome.  Previous diet was changed so patient could order more appropriate food but continues to have symptoms.  Otherwise denies nausea vomiting constipation headache fevers chills chest pain shortness of breath.  Assessment & Plan:  Unstable L1 vertebral fracture status post mechanical fall Neurosurgery directing care -  continue TLSO brace - avoid PT/OT for now under strict bedrest for at least remainder of this week - surgical intervention would mean a thoracolumbar instrumentation and fusion, and she is felt to be a very poor candidate for this - after lengthy discussion with Neurosurgery the patient does not desire surgery and we will pursue conservative management with bracing and bedrest initially, followed by therapy - the high risk of either approach has been explained to the patient both by NS and medical team. -Pain currently well-controlled on current regimen including fentanyl patch, lidocaine patch as well as as needed IV fentanyl and p.o. hydrocodone -Tentative plan to attempt mobility as early as next week (2/26-onward) - if stable could consider increasing PT as tolerated. If attempted mobility goes poorly Neurosurgey indicates she may require an additional 1-2 weeks of  bedbound status.  HTN Essentially stable, currently within normal limits (previously elevated in the setting of pain)  Hypothyroidism Continue usual Synthroid  Baseline mild dementia Continue Aricept  Anxiety/depression Continue usual doses of BuSpar and Ativan  GERD, chronic  Diarrhea, mild  -Continue supportive care, trial imodium today. Follow closely to avoid constipation.  Mild hyokalemia Cont to supplement as needed and follow intermittently   Family Communication: None present Disposition: From ILF facility - is not presently able to participate in therapy due to the risk of spinal instability - will need prolonged bedrest as per Neurosurgery, which may necessitate a prolonged stay in the hospital if supportive care cannot be arranged as an outpatient   Objective: Blood pressure (!) 150/91, pulse (!) 55, temperature 98.4 F (36.9 C), temperature source Oral, resp. rate 17, SpO2 93 %.  Intake/Output Summary (Last 24 hours) at 04/16/2022 0706 Last data filed at 04/15/2022 2054 Gross per 24 hour  Intake 200 ml  Output 400 ml  Net -200 ml    There were no vitals filed for this visit.  Examination: General: No acute respiratory distress - more alert today  Lungs: Without overt wheeze, difficult to auscultate through bracing Cardiovascular: Without JVD or carotid bruit, pulse regular Abdomen: NT/ND, soft, bowel sounds positive Extremities: No significant edema B LE   CBC: Recent Labs  Lab 04/14/22 0554  WBC 8.7  HGB 16.3*  HCT 45.1  MCV 84.0  PLT 123XX123    Basic Metabolic Panel: Recent Labs  Lab 04/11/22 0345 04/14/22 0554  NA 136 132*  K 3.3* 3.6  CL 98 98  CO2 31 26  GLUCOSE 109* 129*  BUN 10 14  CREATININE  0.78 0.93  CALCIUM 8.9 9.2    GFR: CrCl cannot be calculated (Unknown ideal weight.).   Scheduled Meds:  amLODipine  10 mg Oral Daily   busPIRone  30 mg Oral BID   calcium-vitamin D  2 tablet Oral BID WC   donepezil  10 mg Oral QHS    enoxaparin (LOVENOX) injection  40 mg Subcutaneous Q24H   famotidine  20 mg Oral BID   fentaNYL  1 patch Transdermal Q72H   levothyroxine  100 mcg Oral Q0600   lidocaine  1 patch Transdermal Daily   methocarbamol  500 mg Oral TID   metoprolol tartrate  12.5 mg Oral BID   pantoprazole  40 mg Oral BID   polyethylene glycol  17 g Oral Daily   QUEtiapine  12.5 mg Oral QHS   senna-docusate  2 tablet Oral BID   sertraline  200 mg Oral Daily      LOS: 13 days   Little Ishikawa, DO [facility]  If 7PM-7AM, please contact night-coverage - [email or web address] 04/16/2022, 7:06 AM

## 2022-04-16 NOTE — Progress Notes (Signed)
   Providing Compassionate, Quality Care - Together   Subjective: Patient reports no new issues. Pain is stable. She rates it a 5/10 during today's visit. She is sitting up at about 30 degrees, with her brace on, eating breakfast.  Objective: Vital signs in last 24 hours: Temp:  [98.2 F (36.8 C)-99.1 F (37.3 C)] 98.2 F (36.8 C) (02/25 0738) Pulse Rate:  [55-69] 58 (02/25 0738) Resp:  [16-17] 16 (02/25 0738) BP: (148-159)/(55-93) 154/55 (02/25 0738) SpO2:  [93 %-95 %] 94 % (02/25 0738)  Intake/Output from previous day: 02/24 0701 - 02/25 0700 In: 200 [P.O.:200] Out: 400 [Urine:400] Intake/Output this shift: Total I/O In: 240 [P.O.:240] Out: -   Alert and oriented x 4 HOH MAE, Strength and sensation intact  Lab Results: Recent Labs    04/14/22 0554  WBC 8.7  HGB 16.3*  HCT 45.1  PLT 297   BMET Recent Labs    04/14/22 0554  NA 132*  K 3.6  CL 98  CO2 26  GLUCOSE 129*  BUN 14  CREATININE 0.93  CALCIUM 9.2    Studies/Results: No results found.  Assessment/Plan: Patient sustained an L1 Chance fracture with spinal instability following a fall in the bathroom. Patient is a poor surgical candidate and agrees with conservative management with bracing.    LOS: 13 days   -Continue bedrest and bracing -Will trial sitting her up next week.     Viona Gilmore, DNP, AGNP-C Nurse Practitioner  [facility] [place] [address], [place], [place] [address] P: [phone]    F: [phone]  04/16/2022, 9:58 AM

## 2022-04-17 ENCOUNTER — Inpatient Hospital Stay (HOSPITAL_COMMUNITY): Payer: Medicare Other

## 2022-04-17 DIAGNOSIS — S32012G Unstable burst fracture of first lumbar vertebra, subsequent encounter for fracture with delayed healing: Secondary | ICD-10-CM | POA: Diagnosis not present

## 2022-04-17 LAB — CBC
HCT: 43.1 % (ref 36.0–46.0)
Hemoglobin: 14.9 g/dL (ref 12.0–15.0)
MCH: 29.6 pg (ref 26.0–34.0)
MCHC: 34.6 g/dL (ref 30.0–36.0)
MCV: 85.5 fL (ref 80.0–100.0)
Platelets: 296 10*3/uL (ref 150–400)
RBC: 5.04 MIL/uL (ref 3.87–5.11)
RDW: 13.8 % (ref 11.5–15.5)
WBC: 6.4 10*3/uL (ref 4.0–10.5)
nRBC: 0 % (ref 0.0–0.2)

## 2022-04-17 LAB — BASIC METABOLIC PANEL
Anion gap: 11 (ref 5–15)
BUN: 14 mg/dL (ref 8–23)
CO2: 24 mmol/L (ref 22–32)
Calcium: 8.9 mg/dL (ref 8.9–10.3)
Chloride: 99 mmol/L (ref 98–111)
Creatinine, Ser: 0.93 mg/dL (ref 0.44–1.00)
GFR, Estimated: >60 mL/min (ref >60)
Glucose, Bld: 121 mg/dL — ABNORMAL HIGH (ref 70–99)
Potassium: 4 mmol/L (ref 3.5–5.1)
Sodium: 134 mmol/L — ABNORMAL LOW (ref 135–145)

## 2022-04-17 NOTE — Progress Notes (Signed)
Subjective: The patient is alert and pleasant.  Objective: Vital signs in last 24 hours: Temp:  [98.1 F (36.7 C)-99.5 F (37.5 C)] 99.4 F (37.4 C) (02/26 1312) Pulse Rate:  [51-63] 57 (02/26 1312) Resp:  [17-18] 17 (02/26 1312) BP: (133-153)/(60-73) 133/60 (02/26 1312) SpO2:  [94 %-97 %] 94 % (02/26 1312) Estimated body mass index is 26.17 kg/m as calculated from the following:   Height as of 02/14/21: 5' (1.524 m).   Weight as of 02/14/21: 60.8 kg.   Intake/Output from previous day: 02/25 0701 - 02/26 0700 In: 720 [P.O.:720] Out: 450 [Urine:450] Intake/Output this shift: Total I/O In: 240 [P.O.:240] Out: 100 [Urine:100]  Physical exam patient is alert and oriented.  Her strength is normal.  I reviewed the patient's lateral lumbar x-ray.  Her L1 Chance fracture does not significantly sublux while sitting with the brace on.  Lab Results: Recent Labs    04/17/22 0733  WBC 6.4  HGB 14.9  HCT 43.1  PLT 296   BMET Recent Labs    04/17/22 0733  NA 134*  K 4.0  CL 99  CO2 24  GLUCOSE 121*  BUN 14  CREATININE 0.93  CALCIUM 8.9    Studies/Results: DG Lumbar Spine 1 View  Result Date: 04/17/2022 CLINICAL DATA:  Evaluate L1 Chance fracture EXAM: LUMBAR SPINE - 1 VIEW COMPARISON:  Prior lumbar radiographs 04/04/2022; MRI lumbar spine 04/03/2022; CT scan lumbar spine 04/03/2022 FINDINGS: Limited lateral radiograph due to hyperkyphosis and mild scoliosis. Evidence of prior chronic compression fractures at T10, T11 and T12 with changes of prior cement augmentation at T11 and T12. The chance fracture at L1 is not well seen. The L1 vertebral body is rotated which makes assessing height loss somewhat challenging. Height loss is grossly 10%. This suggests some slight interval compaction of the fracture site compared to the prior imaging. Similar appearance of multilevel degenerative disc disease and facet arthropathy. No significant spondylolisthesis. IMPRESSION: Very limited  evaluation. L1 Chance fracture better demonstrated on prior cross-sectional imaging. There has been some slight interval height loss, approximately 10%, since the prior cross-sectional imaging suggesting an element of compaction at the fracture site. No convincing evidence of new fracture or malalignment. Stable chronic T10, T11 and T12 fractures. Electronically Signed   By: Jacqulynn Cadet M.D.   On: 04/17/2022 11:23    Assessment/Plan: L1 Chance fracture: At this point the patient seems to be okay to begin to mobilize with her TLSO on.  She should don her TLSO when the head of the bed is greater than 30 degrees and before getting out of bed.  I have answered all her questions.  It looks like she will need placement.  LOS: 14 days     [name] 04/17/2022, 5:48 PM     Patient ID: Erika Conley, female   DOB: 08-Apr-1934, 88 y.o.   MRN: [id number]

## 2022-04-17 NOTE — Progress Notes (Addendum)
Erika Conley  [id number] DOB: Jan 02, 1935 DOA: 04/03/2022 PCP: Pcp, No    Brief Narrative:  88 year old ILF [employer]) resident with a history of anxiety/depression, HTN, hypothyroidism, and fibromyalgia who ambulates with a cane at baseline but fell 2 nights prior to her presentation after simply losing her balance and falling into the tub/shower.  She immediately experienced significant low back pain following the fall.  In the ER she was found to have an unstable L1 compression fracture.  Consultants:  Neurosurgery  Goals of Care:  Code Status: DNR   DVT prophylaxis: Lovenox  Interim Hx: No acute issues or events overnight, patient reports ongoing diarrhea in the setting of irritable bowel syndrome.  Previous diet was changed so patient could order more appropriate food but continues to have symptoms.  Otherwise denies nausea vomiting constipation headache fevers chills chest pain shortness of breath.  Assessment & Plan:  Unstable L1 vertebral fracture status post mechanical fall Neurosurgery directing care -  continue TLSO brace - avoid PT/OT for now under strict bedrest for at least remainder of this week - surgical intervention would mean a thoracolumbar instrumentation and fusion, and she is felt to be a very poor candidate for this - after lengthy discussion with Neurosurgery the patient does not desire surgery and we will pursue conservative management with bracing and bedrest initially, followed by therapy - the high risk of either approach has been explained to the patient both by NS and medical team. -Pain currently well-controlled on current regimen including fentanyl patch, lidocaine patch as well as as needed IV fentanyl and p.o. hydrocodone -Tentative plan to attempt mobility as early as this week (2/26-onward) - if stable could consider increasing PT as tolerated. If attempted mobility goes poorly Neurosurgey indicates she may require an additional 1-2 weeks of  bedbound status.  HTN Essentially stable, currently within normal limits (previously elevated in the setting of pain)  Hypothyroidism Continue usual Synthroid  Baseline mild dementia Continue Aricept  Anxiety/depression Continue usual doses of BuSpar and Ativan  GERD, chronic  Diarrhea, mild, resolving -Continue supportive care, trial imodium working quite well. Follow closely to avoid constipation.  Mild hyokalemia Cont to supplement as needed and follow intermittently   Family Communication: None present Disposition: From ILF facility - is not presently able to participate in therapy due to the risk of spinal instability - will need prolonged bedrest as per Neurosurgery, which may necessitate a prolonged stay in the hospital if supportive care cannot be arranged as an outpatient. Given patient's mobility/independence she may require inpatient rehab vs SNF pending her strength/mobility/independence over the next week.  Objective: Blood pressure (!) 150/73, pulse 60, temperature 98.4 F (36.9 C), temperature source Oral, resp. rate 18, SpO2 96 %.  Intake/Output Summary (Last 24 hours) at 04/17/2022 0731 Last data filed at 04/16/2022 1657 Gross per 24 hour  Intake 720 ml  Output 450 ml  Net 270 ml    There were no vitals filed for this visit.  Examination: General: No acute respiratory distress - more alert today  Lungs: Without overt wheeze, difficult to auscultate through bracing Cardiovascular: Without JVD or carotid bruit, pulse regular Abdomen: NT/ND, soft, bowel sounds positive Extremities: No significant edema B LE   CBC: Recent Labs  Lab 04/14/22 0554  WBC 8.7  HGB 16.3*  HCT 45.1  MCV 84.0  PLT [id number]    Basic Metabolic Panel: Recent Labs  Lab 04/11/22 0345 04/14/22 0554  NA 136 132*  K 3.3* 3.6  CL 98 98  CO2 31 26  GLUCOSE 109* 129*  BUN 10 14  CREATININE 0.78 0.93  CALCIUM 8.9 9.2    GFR: CrCl cannot be calculated (Unknown ideal  weight.).   Scheduled Meds:  amLODipine  10 mg Oral Daily   busPIRone  30 mg Oral BID   calcium-vitamin D  2 tablet Oral BID WC   donepezil  10 mg Oral QHS   enoxaparin (LOVENOX) injection  40 mg Subcutaneous Q24H   famotidine  20 mg Oral BID   levothyroxine  100 mcg Oral Q0600   lidocaine  1 patch Transdermal Daily   methocarbamol  500 mg Oral TID   metoprolol tartrate  12.5 mg Oral BID   pantoprazole  40 mg Oral BID   polyethylene glycol  17 g Oral Daily   QUEtiapine  12.5 mg Oral QHS   senna-docusate  2 tablet Oral BID   sertraline  200 mg Oral Daily      LOS: 14 days   Little Ishikawa, DO [facility]  If 7PM-7AM, please contact night-coverage - [email or web address] 04/17/2022, 7:31 AM

## 2022-04-18 DIAGNOSIS — S32012G Unstable burst fracture of first lumbar vertebra, subsequent encounter for fracture with delayed healing: Secondary | ICD-10-CM | POA: Diagnosis not present

## 2022-04-18 MED ORDER — OYSTER SHELL CALCIUM/D3 500-5 MG-MCG PO TABS: 2.0000 | ORAL_TABLET | Freq: Two times a day (BID) | ORAL | 0 refills | Status: DC

## 2022-04-18 MED ORDER — HYDROCODONE-ACETAMINOPHEN 5-325 MG PO TABS: 1.0000 | ORAL_TABLET | ORAL | 0 refills | Status: AC | PRN

## 2022-04-18 MED ORDER — ACETAMINOPHEN 325 MG PO TABS: 325.0000 mg | ORAL_TABLET | ORAL | 0 refills | Status: AC | PRN

## 2022-04-18 MED ORDER — METOPROLOL TARTRATE 25 MG PO TABS: 12.5000 mg | ORAL_TABLET | Freq: Two times a day (BID) | ORAL | 1 refills | Status: DC

## 2022-04-18 MED ORDER — AMLODIPINE BESYLATE 10 MG PO TABS: 10.0000 mg | ORAL_TABLET | Freq: Every day | ORAL | 2 refills | Status: DC

## 2022-04-18 MED ORDER — LORAZEPAM 0.5 MG PO TABS: 0.5000 mg | ORAL_TABLET | Freq: Every evening | ORAL | 0 refills | Status: DC

## 2022-04-18 MED ORDER — SENNOSIDES-DOCUSATE SODIUM 8.6-50 MG PO TABS: 1.0000 | ORAL_TABLET | Freq: Every evening | ORAL | 0 refills | Status: AC | PRN

## 2022-04-18 NOTE — Evaluation (Signed)
Physical Therapy Evaluation Patient Details Name: Erika Conley MRN: [id number] DOB: [birth year] Today's Date: 04/18/2022  History of Present Illness  Pt is a 88 y/o female presenting on 2/12 after fall in shower. Found with unstable L1 chance fracture. Was on bedrest until 2/26. PMH includes: HTN, fibromyalgia, dementia, anxiety, vertigo, R wrist fx s/p surgery, and prior back surgery.  Clinical Impression  PTA, pt independent with ADL's and uses a Rollator for mobility. Pt lives at an [place] and endorses frequent falls. Pt presents with decreased functional mobility secondary to pain, impaired balance, generalized weakness, decreased activity tolerance. Pt requiring min assist +2 safety with RW for ambulation room distances. Presents as a high fall risk based on history of falls and decreased gait speed and also will need assist to don/doff brace from bed level. Recommend SNF at d/c.     Recommendations for follow up therapy are one component of a multi-disciplinary discharge planning process, led by the attending physician.  Recommendations may be updated based on patient status, additional functional criteria and insurance authorization.  Follow Up Recommendations Skilled nursing-short term rehab (<3 hours/day) Can patient physically be transported by private vehicle: Yes    Assistance Recommended at Discharge Frequent or constant Supervision/Assistance  Patient can return home with the following  A little help with walking and/or transfers;A little help with bathing/dressing/bathroom;Assistance with cooking/housework;Assist for transportation;Help with stairs or ramp for entrance    Equipment Recommendations Other (comment) (defer)  Recommendations for Other Services       Functional Status Assessment Patient has had a recent decline in their functional status and demonstrates the ability to make significant improvements in function in a reasonable and predictable amount of time.      Precautions / Restrictions Precautions Precautions: Fall;Back;Other (comment) Precaution Booklet Issued: No Precaution Comments: TLSO in bed with HOB >30 degrees. Reviewed precuations with pt. Required Braces or Orthoses: Spinal Brace Spinal Brace: Thoracolumbosacral orthotic;Applied in supine position Restrictions Weight Bearing Restrictions: No      Mobility  Bed Mobility Overal bed mobility: Needs Assistance Bed Mobility: Rolling, Sidelying to Sit Rolling: Min assist Sidelying to sit: Min assist, HOB elevated       General bed mobility comments: cueing for technique and trunk support to ascend.    Transfers Overall transfer level: Needs assistance Equipment used: Rolling walker (2 wheels) Transfers: Sit to/from Stand Sit to Stand: Min assist, +2 safety/equipment           General transfer comment: cueing for hand placement and safety, assist to power up with mild instability    Ambulation/Gait Ambulation/Gait assistance: Min assist, +2 safety/equipment Gait Distance (Feet): 15 Feet Assistive device: Rolling walker (2 wheels) Gait Pattern/deviations: Step-through pattern, Decreased stride length Gait velocity: decreased     General Gait Details: Mild dynamic and bilat knee instability. MinA for balance  Stairs            Wheelchair Mobility    Modified Rankin (Stroke Patients Only)       Balance Overall balance assessment: Needs assistance Sitting-balance support: No upper extremity supported, Feet supported Sitting balance-Leahy Scale: Fair     Standing balance support: Bilateral upper extremity supported, No upper extremity supported, During functional activity Standing balance-Leahy Scale: Poor Standing balance comment: relies on BUE and external support                             Pertinent Vitals/Pain Pain Assessment Pain Assessment: 0-10 Pain  Score: 7  Pain Location: back (L side) Pain Descriptors / Indicators:  Discomfort Pain Intervention(s): Limited activity within patient's tolerance, Monitored during session, Premedicated before session    Home Living Family/patient expects to be discharged to:: Skilled nursing facility                   Additional Comments: from independent living    Prior Function Prior Level of Function : Independent/Modified Independent;History of Falls (last six months)             Mobility Comments: using rollator for mobility ADLs Comments: independent ADLs     Hand Dominance   Dominant Hand: Right    Extremity/Trunk Assessment   Upper Extremity Assessment Upper Extremity Assessment: Defer to OT evaluation    Lower Extremity Assessment Lower Extremity Assessment: Generalized weakness    Cervical / Trunk Assessment Cervical / Trunk Assessment: Other exceptions Cervical / Trunk Exceptions: L1 chance fx  Communication   Communication: No difficulties  Cognition Arousal/Alertness: Awake/alert Behavior During Therapy: WFL for tasks assessed/performed Overall Cognitive Status: History of cognitive impairments - at baseline                                 General Comments: pt with hx of mild dementia, she is oriented and follows commands with increased time.  Pleasant and cooperative throughout session.        General Comments General comments (skin integrity, edema, etc.): pt reports dizziness initally with sitting and standing but subsides    Exercises     Assessment/Plan    PT Assessment Patient needs continued PT services  PT Problem List Decreased strength;Decreased activity tolerance;Decreased balance;Decreased mobility;Decreased knowledge of precautions;Pain       PT Treatment Interventions Gait training;DME instruction;Therapeutic activities;Functional mobility training;Therapeutic exercise;Balance training;Patient/family education    PT Goals (Current goals can be found in the Care Plan section)  Acute Rehab  PT Goals Patient Stated Goal: to do well PT Goal Formulation: With patient Time For Goal Achievement: 05/02/22 Potential to Achieve Goals: Good    Frequency Min 3X/week     Co-evaluation PT/OT/SLP Co-Evaluation/Treatment: Yes Reason for Co-Treatment: To address functional/ADL transfers;For patient/therapist safety   OT goals addressed during session: ADL's and self-care       AM-PAC PT "6 Clicks" Mobility  Outcome Measure Help needed turning from your back to your side while in a flat bed without using bedrails?: A Little Help needed moving from lying on your back to sitting on the side of a flat bed without using bedrails?: A Little Help needed moving to and from a bed to a chair (including a wheelchair)?: A Little Help needed standing up from a chair using your arms (e.g., wheelchair or bedside chair)?: A Little Help needed to walk in hospital room?: A Little Help needed climbing 3-5 steps with a railing? : A Lot 6 Click Score: 17    End of Session Equipment Utilized During Treatment: Gait belt;Other (comment) (TLSO) Activity Tolerance: Patient tolerated treatment well Patient left: in chair;with call bell/phone within reach;with nursing/sitter in room;Other (comment) (no chair alarm box (RN notified)) Nurse Communication: Mobility status PT Visit Diagnosis: Unsteadiness on feet (R26.81);History of falling (Z91.81);Muscle weakness (generalized) (M62.81);Difficulty in walking, not elsewhere classified (R26.2)    Time: [id number] PT Time Calculation (min) (ACUTE ONLY): 38 min   Charges:   PT Evaluation $PT Eval Moderate Complexity: 1 Mod  Wyona Almas, PT, DPT Acute Rehabilitation Services Office [place] 04/18/2022, 10:28 AM

## 2022-04-18 NOTE — Progress Notes (Signed)
Pt wheeled off unit by [facility].

## 2022-04-18 NOTE — TOC Transition Note (Signed)
Transition of Care [facility]) - CM/SW Discharge Note   Patient Details  Name: Erika Conley MRN: [id number] Date of Birth: 09-30-1934  Transition of Care [facility]) CM/SW Contact:  Geralynn Ochs, LCSW Phone Number: 04/18/2022, 1:24 PM   Clinical Narrative:   CSW notified by MD that patient is now medically stable for SNF placement, able to participate in therapy. Patient would prefer [facility], and they are able to accept. CSW sent discharge information to [facility], confirmed bed availability. CSW updated son, [name], via phone, who is in agreement. Transport arranged with [facility] for next available.  Nurse to call report to [phone], Room 104B.    Final next level of care: Skilled Nursing Facility Barriers to Discharge: Barriers Resolved   Patient Goals and CMS Choice CMS Medicare.gov Compare Post Acute Care list provided to:: Patient Choice offered to / list presented to : Patient, Adult Children  Discharge Placement                Patient chooses bed at: [place], [place] Patient to be transferred to facility by: [place] Name of family member notified: [name] Patient and family notified of of transfer: 04/18/22  Discharge Plan and Services Additional resources added to the After Visit Summary for       Post Acute Care Choice: NA                               Social Determinants of Health (SDOH) Interventions SDOH Screenings   Tobacco Use: Low Risk  (10/13/2021)     Readmission Risk Interventions     No data to display

## 2022-04-18 NOTE — NC FL2 (Signed)
[place] LEVEL OF CARE FORM     IDENTIFICATION  Patient Name: Erika Conley Birthdate: April 14, 1934 Sex: female Admission Date (Current Location): 04/03/2022  [facility] and [place] Number:  Herbalist and Address:  [facility], [place] [address] [place], [place] [address]      Provider Number: [id number]  Attending Physician Name and Address:  Little Ishikawa, MD  Relative Name and Phone Number:       Current Level of Care: Hospital Recommended Level of Care: [place] Prior Approval Number:    Date Approved/Denied:   PASRR Number: [id number]  Discharge Plan: SNF    Current Diagnoses: Patient Active Problem List   Diagnosis Date Noted   Pressure injury of skin 04/16/2022   Lumbar vertebral fracture ([place]) 04/03/2022   Anxiety and depression 04/03/2022   HTN (hypertension) 04/03/2022   Dementia without behavioral disturbance ([place]) 04/03/2022   Traumatic compression fracture of L1 lumbar vertebra ([place]) 04/03/2022   Syncope and collapse 12/09/2018   Syncope 12/08/2018   Nausea and vomiting 12/14/2015   Hyponatremia 12/13/2015   Obstructive jaundice    Bile duct stricture    Pancreatitis 10/23/2015   Abdominal pain 10/22/2015   Pancreatic mass 10/22/2015   Common bile duct (CBD) obstruction    Abnormal LFTs    Dehydration with hyponatremia 04/09/2015   Acute respiratory failure with hypoxia ([place]) 04/09/2015   Thrombocytopenia ([place]) 04/09/2015   Hypokalemia 11/10/2014   Hypertension 11/10/2014   Hypothyroidism 11/10/2014   Syncope due to orthostatic hypotension 11/10/2014   Constipated    UTI (urinary tract infection) 06/04/2013   Altered mental status 06/04/2013    Orientation RESPIRATION BLADDER Height & Weight     Time, Self, Situation, Place  Normal Incontinent Weight:   Height:     BEHAVIORAL SYMPTOMS/MOOD NEUROLOGICAL BOWEL NUTRITION STATUS      Continent Diet (regular)  AMBULATORY  STATUS COMMUNICATION OF NEEDS Skin   Limited Assist Verbally Normal                       Personal Care Assistance Level of Assistance  Bathing, Feeding, Dressing Bathing Assistance: Limited assistance Feeding assistance: Limited assistance Dressing Assistance: Limited assistance     Functional Limitations Info  Sight, Hearing Sight Info: Impaired Hearing Info: Impaired      SPECIAL CARE FACTORS FREQUENCY  PT (By licensed PT), OT (By licensed OT)     PT Frequency: 5x/wk OT Frequency: 5x/wk            Contractures Contractures Info: Not present    Additional Factors Info  Code Status, Allergies, Psychotropic Code Status Info: DNR Allergies Info: Chocolate, Codeine, Cortisone, Guggulipid-black Pepper, Morphine, Nsaids, Other Psychotropic Info: Buspar '30mg'$  2x/day; Seroquel 12.'5mg'$  daily at bed; Zoloft '200mg'$  daily         Current Medications (04/18/2022):  This is the current hospital active medication list Current Facility-Administered Medications  Medication Dose Route Frequency Provider Last Rate Last Admin   acetaminophen (TYLENOL) tablet 325 mg  325 mg Oral Q4H PRN Little Ishikawa, MD   325 mg at 04/16/22 1701   amLODipine (NORVASC) tablet 10 mg  10 mg Oral Daily Elgergawy, Silver Huguenin, MD   10 mg at 04/18/22 1031   busPIRone (BUSPAR) tablet 30 mg  30 mg Oral BID Crosley, Debby, MD   30 mg at 04/18/22 1031   calcium carbonate (TUMS - dosed in mg elemental calcium) chewable tablet  400-600 mg of elemental calcium  2-3 tablet Oral TID WC PRN Cherene Altes, MD   400 mg of elemental calcium at 04/16/22 2044   calcium-vitamin D (OSCAL WITH D) 500-5 MG-MCG per tablet 2 tablet  2 tablet Oral BID WC Elgergawy, Silver Huguenin, MD   2 tablet at 04/18/22 0834   diphenhydrAMINE (BENADRYL) capsule 25 mg  25 mg Oral Q8H PRN Joette Catching T, MD   25 mg at 04/18/22 1030   donepezil (ARICEPT) tablet 10 mg  10 mg Oral QHS Crosley, Debby, MD   10 mg at 04/17/22 2152   enoxaparin  (LOVENOX) injection 40 mg  40 mg Subcutaneous Q24H Joette Catching T, MD   40 mg at 04/18/22 1031   famotidine (PEPCID) tablet 20 mg  20 mg Oral BID Crosley, Debby, MD   20 mg at 04/18/22 1030   HYDROcodone-acetaminophen (NORCO/VICODIN) 5-325 MG per tablet 1-2 tablet  1-2 tablet Oral Q4H PRN Cherene Altes, MD   1 tablet at 04/18/22 1031   HYDROmorphone (DILAUDID) injection 0.5 mg  0.5 mg Intravenous Q3H PRN Little Ishikawa, MD   0.5 mg at 04/16/22 1318   levothyroxine (SYNTHROID) tablet 100 mcg  100 mcg Oral Q0600 Crosley, Debby, MD   100 mcg at 04/18/22 0631   lidocaine (LIDODERM) 5 % 1 patch  1 patch Transdermal Daily Vernell Leep D, MD   1 patch at 04/18/22 1028   loperamide (IMODIUM) capsule 2 mg  2 mg Oral PRN Little Ishikawa, MD   2 mg at 04/17/22 0840   LORazepam (ATIVAN) tablet 0.5 mg  0.5 mg Oral BID PRN Joette Catching T, MD   0.5 mg at 04/17/22 1658   methocarbamol (ROBAXIN) tablet 500 mg  500 mg Oral TID Vernell Leep D, MD   500 mg at 04/18/22 1031   metoprolol tartrate (LOPRESSOR) tablet 12.5 mg  12.5 mg Oral BID Irene Pap N, DO   12.5 mg at 04/18/22 1031   ondansetron (ZOFRAN) tablet 4 mg  4 mg Oral Q6H PRN Crosley, Debby, MD   4 mg at 04/12/22 1316   Or   ondansetron (ZOFRAN) injection 4 mg  4 mg Intravenous Q6H PRN Crosley, Debby, MD   4 mg at 04/17/22 1220   Oral care mouth rinse  15 mL Mouth Rinse PRN Cherene Altes, MD       pantoprazole (PROTONIX) EC tablet 40 mg  40 mg Oral BID Crosley, Debby, MD   40 mg at 04/18/22 1031   polyethylene glycol (MIRALAX / GLYCOLAX) packet 17 g  17 g Oral Daily Vernell Leep D, MD   17 g at 04/18/22 1028   QUEtiapine (SEROQUEL) tablet 12.5 mg  12.5 mg Oral QHS Joette Catching T, MD   12.5 mg at 04/17/22 2202   senna-docusate (Senokot-S) tablet 1 tablet  1 tablet Oral QHS PRN Quintella Baton, MD       senna-docusate (Senokot-S) tablet 2 tablet  2 tablet Oral BID Elgergawy, Silver Huguenin, MD   2 tablet at 04/18/22 1030    sertraline (ZOLOFT) tablet 200 mg  200 mg Oral Daily Crosley, Debby, MD   200 mg at 04/18/22 1030     Discharge Medications: Please see discharge summary for a list of discharge medications.  Relevant Imaging Results:  Relevant Lab Results:   Additional Information SS#: [id number]  Geralynn Ochs, LCSW

## 2022-04-18 NOTE — Discharge Summary (Signed)
Physician Discharge Summary  Erika Conley [id number] DOB: 01-23-1935 DOA: 04/03/2022  PCP: Merryl Hacker, No  Admit date: 04/03/2022 Discharge date: 04/18/2022  Admitted From: ALF Disposition:  SNF  Recommendations for Outpatient Follow-up:  Follow up with PCP in 1-2 weeks Follow up with neurosurgery as scheduled  Discharge Condition:Stable  CODE STATUS: DNR  Diet recommendation: As tolerated    Brief/Interim Summary: 88 year old [facility] [employer]) resident with a history of anxiety/depression, HTN, hypothyroidism, and fibromyalgia who ambulates with a cane at baseline but fell 2 nights prior to her presentation after simply losing her balance and falling into the tub/shower. She immediately experienced significant low back pain following the fall. In the ER she was found to have an unstable L1 compression fracture.   Patient admitted as above with unstable L1 vertebral fracture status post mechanical fall.  Patient is a poor surgical candidate as well as opted for medical management.  Neurosurgery following along appreciate insight recommendations.  She was placed on bedrest with TLSO brace in hopes her spinal fracture would stabilize.  Upon initiating physical therapy per neurosurgery recommendations patient did remarkably well, she is unsafe to discharge back to assisted living facility but given her stability she is otherwise stable and agreeable for discharge to skilled nursing facility for ongoing physical therapy with ultimate plan to return home at her prior living facility.  Medication changes as below include increased amlodipine, ongoing pain control with hydrocodone.  Otherwise follow-up with PCP and neurosurgery as scheduled in the next few weeks, repeat imaging will depend on clinical course and neurosurgical evaluation at the clinic.  Discharge Diagnoses:  Principal Problem:   Lumbar vertebral fracture (HCC) Active Problems:   Hypertension   Hypothyroidism   Anxiety and  depression   HTN (hypertension)   Dementia without behavioral disturbance (HCC)   Traumatic compression fracture of L1 lumbar vertebra (HCC)   Pressure injury of skin  Unstable L1 vertebral fracture status post mechanical fall - Neurosurgery directing care -  continue TLSO brace - avoid PT/OT for now under strict bedrest for at least remainder of this week - surgical intervention would mean a thoracolumbar instrumentation and fusion, and she is felt to be a very poor candidate for this - after lengthy discussion with Neurosurgery the patient does not desire surgery and we will pursue conservative management with bracing and bedrest initially, followed by therapy -Pain currently well-controlled, continue hydrocodone, acetaminophen -Patient evaluated by physical therapy today with remarkable improvement in symptoms, able to ambulate without pain, otherwise stable and agreeable for discharge with ongoing TLSO brace per neurosurgery recommendations.   HTN Essentially stable, currently within normal limits (previously elevated in the setting of pain)   Hypothyroidism Continue usual Synthroid   Baseline mild dementia Continue Aricept   Anxiety/depression Continue usual doses of BuSpar and Ativan   GERD, chronic  Diarrhea, mild, resolving -Continue supportive care, trial imodium working quite well. Follow closely to avoid constipation.   Mild hyokalemia Cont to supplement as needed and follow intermittently  Resolved, follow-up labs in the next few weeks as scheduled  Discharge Instructions   Allergies as of 04/18/2022       Reactions   Chocolate Itching   Codeine Other (See Comments)   "Passes out and falls"   Cortisone Other (See Comments)   "Passes out and falls"   Guggulipid-black Pepper Itching   Pepper   Morphine Other (See Comments)   Reaction (?)   Nsaids Other (See Comments)   Reaction (?)   Other  Other (See Comments)   All narcotics cause patient to "pass out and  fall"        Medication List     STOP taking these medications    amoxicillin 500 MG capsule Commonly known as: AMOXIL   hydrOXYzine 25 MG capsule Commonly known as: VISTARIL   ibuprofen 800 MG tablet Commonly known as: ADVIL       TAKE these medications    acetaminophen 325 MG tablet Commonly known as: TYLENOL Take 1 tablet (325 mg total) by mouth every 4 (four) hours as needed for headache. What changed:  how much to take reasons to take this   amLODipine 10 MG tablet Commonly known as: NORVASC Take 1 tablet (10 mg total) by mouth daily. Start taking on: April 19, 2022 What changed:  medication strength how much to take when to take this   Biotin 5000 MCG Caps Take 5,000 mcg by mouth in the morning.   busPIRone 30 MG tablet Commonly known as: BUSPAR Take 30 mg by mouth in the morning and at bedtime.   calcium carbonate 500 MG chewable tablet Commonly known as: TUMS - dosed in mg elemental calcium Chew 2-3 tablets by mouth 3 (three) times daily with meals as needed (for GERD).   calcium-vitamin D 500-5 MG-MCG tablet Commonly known as: OSCAL WITH D Take 2 tablets by mouth 2 (two) times daily with a meal.   cetirizine 10 MG tablet Commonly known as: ZYRTEC Take 10 mg by mouth daily.   dicyclomine 20 MG tablet Commonly known as: BENTYL Take 20 mg by mouth 3 (three) times daily.   donepezil 10 MG tablet Commonly known as: ARICEPT Take 10 mg by mouth at bedtime.   famotidine 20 MG tablet Commonly known as: PEPCID Take 20 mg by mouth 2 (two) times daily.   fluticasone 50 MCG/ACT nasal spray Commonly known as: FLONASE Place 2 sprays into both nostrils daily.   HYDROcodone-acetaminophen 5-325 MG tablet Commonly known as: NORCO/VICODIN Take 1-2 tablets by mouth every 4 (four) hours as needed for up to 7 days for severe pain.   levothyroxine 100 MCG tablet Commonly known as: SYNTHROID Take 100 mcg by mouth daily before breakfast.    loperamide 2 MG tablet Commonly known as: IMODIUM A-D Take 4 mg by mouth See admin instructions. Take 4 mg by mouth after 1st loose stool movement, as directed   LORazepam 0.5 MG tablet Commonly known as: ATIVAN Take 1 tablet (0.5 mg total) by mouth every evening.   meclizine 25 MG tablet Commonly known as: ANTIVERT Take 25 mg by mouth 3 (three) times daily as needed for dizziness.   metoprolol tartrate 25 MG tablet Commonly known as: LOPRESSOR Take 0.5 tablets (12.5 mg total) by mouth 2 (two) times daily.   omeprazole 40 MG capsule Commonly known as: PRILOSEC Take 1 capsule (40 mg total) by mouth 2 (two) times daily. Take shortly before breakfast and dinner meal What changed:  when to take this additional instructions   PreserVision AREDS 2 Caps Take 1 capsule by mouth in the morning and at bedtime.   REFRESH OPTIVE PF OP Place 1 drop into both eyes every 4 (four) hours as needed (for dryness).   senna-docusate 8.6-50 MG tablet Commonly known as: Senokot-S Take 1 tablet by mouth at bedtime as needed for mild constipation.   sertraline 100 MG tablet Commonly known as: ZOLOFT Take 200 mg by mouth in the morning.   traZODone 100 MG tablet Commonly known as: DESYREL  Take 1.5 tablets (150 mg total) by mouth at bedtime.   Vagisil 1 % Crea Generic drug: Hydrocortisone Acetate Apply 1 Application topically 4 (four) times daily as needed (for itching).   Voltaren 1 % Gel Generic drug: diclofenac Sodium Apply 2 g topically every 12 (twelve) hours as needed (for bilateral knees or back (PAIN)).        Allergies  Allergen Reactions   Chocolate Itching   Codeine Other (See Comments)    "Passes out and falls"   Cortisone Other (See Comments)    "Passes out and falls"   Guggulipid-Black Pepper Itching    Pepper   Morphine Other (See Comments)    Reaction (?)   Nsaids Other (See Comments)    Reaction (?)   Other Other (See Comments)    All narcotics cause patient  to "pass out and fall"     Consultations: Neurosurgery   Procedures/Studies: DG Lumbar Spine 1 View  Result Date: 04/17/2022 CLINICAL DATA:  Evaluate L1 Chance fracture EXAM: LUMBAR SPINE - 1 VIEW COMPARISON:  Prior lumbar radiographs 04/04/2022; MRI lumbar spine 04/03/2022; CT scan lumbar spine 04/03/2022 FINDINGS: Limited lateral radiograph due to hyperkyphosis and mild scoliosis. Evidence of prior chronic compression fractures at T10, T11 and T12 with changes of prior cement augmentation at T11 and T12. The chance fracture at L1 is not well seen. The L1 vertebral body is rotated which makes assessing height loss somewhat challenging. Height loss is grossly 10%. This suggests some slight interval compaction of the fracture site compared to the prior imaging. Similar appearance of multilevel degenerative disc disease and facet arthropathy. No significant spondylolisthesis. IMPRESSION: Very limited evaluation. L1 Chance fracture better demonstrated on prior cross-sectional imaging. There has been some slight interval height loss, approximately 10%, since the prior cross-sectional imaging suggesting an element of compaction at the fracture site. No convincing evidence of new fracture or malalignment. Stable chronic T10, T11 and T12 fractures. Electronically Signed   By: Jacqulynn Cadet M.D.   On: 04/17/2022 11:23   ECHOCARDIOGRAM COMPLETE  Result Date: 04/04/2022    ECHOCARDIOGRAM REPORT   Patient Name:   Erika Conley Date of Exam: 04/04/2022 Medical Rec #:  [id number]      Height:       60.0 in Accession #:    [id number]     Weight:       134.0 lb Date of Birth:  05/15/1934     BSA:          1.574 m Patient Age:    40 years       BP:           169/66 mmHg Patient Gender: F              HR:           63 bpm. Exam Location:  Inpatient Procedure: 2D Echo, Cardiac Doppler and Color Doppler Indications:    Syncope  History:        Patient has prior history of Echocardiogram examinations. Risk                  Factors:Hypertension.  Sonographer:    Phineas Douglas Referring Phys: [place]  1. Left ventricular ejection fraction, by estimation, is 60 to 65%. The left ventricle has normal function. The left ventricle has no regional wall motion abnormalities. There is mild asymmetric left ventricular hypertrophy of the basal-septal segment. Left ventricular diastolic parameters are consistent with Grade II  diastolic dysfunction (pseudonormalization).  2. Right ventricular systolic function is normal. The right ventricular size is normal.  3. Left atrial size was mildly dilated.  4. The mitral valve is grossly normal. Trivial mitral valve regurgitation.  5. The aortic valve is tricuspid. There is mild calcification of the aortic valve. There is mild thickening of the aortic valve. Aortic valve regurgitation is not visualized. Aortic valve sclerosis/calcification is present, without any evidence of aortic stenosis. Comparison(s): No significant change from prior study. FINDINGS  Left Ventricle: Left ventricular ejection fraction, by estimation, is 60 to 65%. The left ventricle has normal function. The left ventricle has no regional wall motion abnormalities. The left ventricular internal cavity size was normal in size. There is  mild asymmetric left ventricular hypertrophy of the basal-septal segment. Left ventricular diastolic parameters are consistent with Grade II diastolic dysfunction (pseudonormalization). Right Ventricle: The right ventricular size is normal. No increase in right ventricular wall thickness. Right ventricular systolic function is normal. Left Atrium: Left atrial size was mildly dilated. Right Atrium: Right atrial size was normal in size. Pericardium: There is no evidence of pericardial effusion. Mitral Valve: The mitral valve is grossly normal. There is mild thickening of the mitral valve leaflet(s). Mild mitral annular calcification. Trivial mitral valve regurgitation.  Tricuspid Valve: The tricuspid valve is normal in structure. Tricuspid valve regurgitation is trivial. Aortic Valve: The aortic valve is tricuspid. There is mild calcification of the aortic valve. There is mild thickening of the aortic valve. Aortic valve regurgitation is not visualized. Aortic valve sclerosis/calcification is present, without any evidence of aortic stenosis. Pulmonic Valve: The pulmonic valve was normal in structure. Pulmonic valve regurgitation is trivial. Aorta: The aortic root is normal in size and structure. IAS/Shunts: The atrial septum is grossly normal.  LEFT VENTRICLE PLAX 2D LVIDd:         3.80 cm      Diastology LVIDs:         2.30 cm      LV e' medial:    5.98 cm/s LV PW:         1.20 cm      LV E/e' medial:  17.2 LV IVS:        1.20 cm      LV e' lateral:   7.72 cm/s LVOT diam:     1.80 cm      LV E/e' lateral: 13.3 LV SV:         60 LV SV Index:   38 LVOT Area:     2.54 cm  LV Volumes (MOD) LV vol d, MOD A2C: 106.0 ml LV vol d, MOD A4C: 100.0 ml LV vol s, MOD A2C: 39.3 ml LV vol s, MOD A4C: 44.5 ml LV SV MOD A2C:     66.7 ml LV SV MOD A4C:     100.0 ml LV SV MOD BP:      60.6 ml RIGHT VENTRICLE RV Basal diam:  3.90 cm RV S prime:     12.10 cm/s TAPSE (M-mode): 2.2 cm LEFT ATRIUM             Index        RIGHT ATRIUM           Index LA diam:        4.20 cm 2.67 cm/m   RA Area:     13.20 cm LA Vol (A2C):   58.0 ml 36.84 ml/m  RA Volume:   28.10 ml  17.85 ml/m LA Vol (  A4C):   62.3 ml 39.57 ml/m LA Biplane Vol: 60.0 ml 38.11 ml/m  AORTIC VALVE LVOT Vmax:   94.20 cm/s LVOT Vmean:  63.800 cm/s LVOT VTI:    0.237 m  AORTA Ao Root diam: 2.70 cm Ao Asc diam:  2.90 cm MITRAL VALVE                TRICUSPID VALVE MV Area (PHT): 5.42 cm     TR Peak grad:   30.0 mmHg MV Decel Time: 140 msec     TR Vmax:        274.00 cm/s MV E velocity: 103.00 cm/s MV A velocity: 88.10 cm/s   SHUNTS MV E/A ratio:  1.17         Systemic VTI:  0.24 m                             Systemic Diam: 1.80 cm Gwyndolyn Kaufman MD Electronically signed by Gwyndolyn Kaufman MD Signature Date/Time: 04/04/2022/12:47:33 PM    Final    DG Lumbar Spine 1 View  Result Date: 04/04/2022 CLINICAL DATA:  L1 fracture. EXAM: LUMBAR SPINE - 1 VIEW COMPARISON:  CT scan of April 03, 2022. FINDINGS: Limited exam as the patient was unable to stand or bear weight for this image as well as wearing a brace. Status post kyphoplasty of T11 and T12. Severe degenerative disc disease is noted at L5-S1. L1 compression fracture is not well visualized based on above limitations. However, there does appear to be now noted grade 1 retrolisthesis of L1-2 which may be due to the fracture. Mild grade 1 retrolisthesis of L2-3 is also noted which appears to be more pronounced compared to prior exam. IMPRESSION: L1 compression fracture is not well visualized on this radiograph due to limitations with positioning and wearing a brace. However, there does appear to be significantly increased grade 1 retrolisthesis of L1-2 which may be due to the fracture. Mild grade 1 retrolisthesis of L2-3 is also noted which appears to be more pronounced compared to prior exam. These results will be called to the ordering clinician or representative by the Radiologist Assistant, and communication documented in the PACS or zVision Dashboard. Electronically Signed   By: Marijo Conception M.D.   On: 04/04/2022 12:43   MR LUMBAR SPINE WO CONTRAST  Result Date: 04/03/2022 CLINICAL DATA:  Spine fracture, lumbar, traumatic. EXAM: MRI LUMBAR SPINE WITHOUT CONTRAST TECHNIQUE: Multiplanar, multisequence MR imaging of the lumbar spine was performed. No intravenous contrast was administered. COMPARISON:  CT thoracic and lumbar spine 04/03/2022 FINDINGS: Segmentation:  Standard. Alignment: Mildly exaggerated lumbar lordosis. Mild upper lumbar levoscoliosis. Trace retrolisthesis of L2 on L3. Increased lower thoracic kyphosis. Vertebrae: Chronic, severe T10 compression fracture and  previously augmented T11 and T12 compression fractures. Hemangioma in the L1 vertebral body. Acute oblique/horizontal fracture of the L1 vertebral body with disruption of the anterior and posterior vertebral cortex and superior endplate and with approximately 6 mm distraction as shown on CT. The fracture is again noted to extend through the L1 posterior elements without significant distraction. Anterior widening of the L1-2 disc space is less than on today's earlier CT, however abnormal STIR hyperintensity/fluid signal within the disc is consistent with acute disc injury. The L1-2 facet joints are not frankly distracted by MRI and contain at most trace joint fluid. There is mild marrow edema along the L2 superior endplate corresponding to the anterosuperior vertebral corner fracture shown  on CT. No significant marrow edema is evident in the L3 vertebral body to correspond to the subtle fracture questioned on CT, and there are no definite findings of acute L2-3 disc disruption by MRI. Conus medullaris and cauda equina: Conus extends to the L1-2 level. Conus and cauda equina appear normal. Paraspinal and other soft tissues: Mild edema in the posterior paraspinal soft tissues at L1 which may reflect in part a degree of posterior ligamentous sprain/injury. Disc levels: Mild spinal stenosis at T10-11 and T11-12 due to retropulsion of chronic vertebral compression fractures, disc bulging, and posterior element hypertrophy. Mild spinal stenosis at L1-2, L2-3, and L3-4 due to disc bulging and posterior element hypertrophy. Prior posterior decompression at L4-5 with widely patent spinal canal and moderate right and mild left neural foraminal stenosis. IMPRESSION: 1. Acute L1 Chance fracture with L1-2 disc injury. 2. Acute L2 superior endplate fracture. 3. Mild multilevel spinal stenosis as above. Electronically Signed   By: Logan Bores M.D.   On: 04/03/2022 10:13   CT Lumbar Spine Wo Contrast  Result Date:  04/03/2022 CLINICAL DATA:  88 year old female status post fall with L1 Chance fracture, L2 anterior superior endplate fracture on thoracic CT today. EXAM: CT LUMBAR SPINE WITHOUT CONTRAST TECHNIQUE: Multidetector CT imaging of the lumbar spine was performed without intravenous contrast administration. Multiplanar CT image reconstructions were also generated. RADIATION DOSE REDUCTION: This exam was performed according to the departmental dose-optimization program which includes automated exposure control, adjustment of the mA and/or kV according to patient size and/or use of iterative reconstruction technique. COMPARISON:  Thoracic spine CT 0356 hours today. Previous lumbar MRI 11/11/2010. FINDINGS: Segmentation: Normal, concordant with the thoracic spine numbering today. Alignment: Stable thoracolumbar junction alignment from the 0356 hour exam. Chronic T10 through T12 kyphotic deformity foam multilevel compression fractures, 2 of those levels previously augmented. Relatively preserved lumbar lordosis below L2. Vertebrae: Diffuse osteopenia. Chronic lower thoracic compression fractures and kyphotic deformity with ankylosis as detailed on the thoracic study earlier today. Superimposed comminuted L1 Chance fracture, distracted anterior L1-L2 disc space, L2 anterior superior corner fracture, and mildly widened bilateral L1-L2 facets redemonstrated and stable from earlier today. This includes a nondisplaced right L1 transverse process fracture. Questionable abnormal widening also of the L2-L3 anterior disc space. Trace vacuum disc there. And there does appear to be a minimally displaced fracture of the left lateral anterior L2 corner on both series 6, image 47 and series 7, image 20. And there does appear to be a similar subtle fracture of the anterior right superior L3 endplate also on image 47. But the L2-L3 and other lumbar facets remain aligned. L4, L5, and visible sacrum appear intact. Grossly intact SI joints.  Paraspinal and other soft tissues: Chronic postoperative changes to the lower lumbar paraspinal soft tissues at L4-L5. Previous posterior decompression Calcified aortic atherosclerosis. Absent gallbladder. Negative other visible noncontrast abdominal viscera. Mild ventral L1 and L2 paraspinal edema/hematoma. Disc levels: No CT evidence of significant lumbar spinal stenosis despite E multilevel posttraumatic changes. Previously decompressed L4-L5 level. IMPRESSION: 1. Generalized Osteopenia with Acute: - L1 Chance fracture. Distracted vertebral body fragments but relatively nondisplaced posterior elements. - disrupted L1-L2 disc space with associated anterior superior L2 corner fracture, and probable facet disruption widening of the bilateral L1-L2 facet joints. - probably disrupted anterior L2-L3 disc space also, with subtle anterior L2 and L3 endplate corner fractures on the right. 2. No significant lumbar retropulsion or spinal stenosis by CT. Previous L4-L5 decompression. 3. Chronic lower thoracic kyphotic deformity  with prior augmentation and ankylosis as detailed on the thoracic CT today. Electronically Signed   By: Genevie Ann M.D.   On: 04/03/2022 06:27   CT Thoracic Spine Wo Contrast  Addendum Date: 04/03/2022   ADDENDUM REPORT: 04/03/2022 05:12 ADDENDUM: Study discussed by telephone with Dr. Shanon Rosser on 04/03/2022 at 0446 hours. Also, correction the 3rd sentence in Impression #1 should read: "But aside from MILD DISTRACTION of the L1 vertebral body fragments, the L1 posterior elements are nondisplaced, and no significant spondylolisthesis." Electronically Signed   By: Genevie Ann M.D.   On: 04/03/2022 05:12   Result Date: 04/03/2022 CLINICAL DATA:  88 year old female status post fall. EXAM: CT THORACIC SPINE WITHOUT CONTRAST TECHNIQUE: Multidetector CT images of the thoracic were obtained using the standard protocol without intravenous contrast. RADIATION DOSE REDUCTION: This exam was performed according to  the departmental dose-optimization program which includes automated exposure control, adjustment of the mA and/or kV according to patient size and/or use of iterative reconstruction technique. COMPARISON:  Cervical spine CT today. Thoracic MRI 03/02/2013. CT Chest, Abdomen, and Pelvis today are reported separately. 10/13/2021. FINDINGS: Limited cervical spine imaging:  Reported separately today. Thoracic spine segmentation:  Normal. Alignment: Chronic posttraumatic appearing abnormally increased kyphosis in the lower thoracic spine T10 through T12 appear stable since last year. Straightening of thoracic kyphosis elsewhere. Vertebrae: Diffuse osteopenia. Chronic T10, T11, and T12 vertebral body fractures. T10 vertebral plana, and chronically augmented T11 and T12 levels. Evidence of some interbody ankylosis at those levels through the T12-L1 disc space. The T1 through T9 thoracic levels appear stable and intact compared to last year. No acute posterior rib fracture identified. However, there is an acute horizontal fracture of L1 with chance fracture morphology, horizontal fracture plane tracking into the bilateral L1 pedicles and posterior elements (on the left series 7, image 36). At the anterior L1 body of the fracture plane is oblique and diagonal. And there is mild distraction through the vertebral body fracture up to 5 mm. No significant spondylolisthesis. Posterior elements remain nondisplaced. Associated nondisplaced fracture of the right L1 transverse process. Furthermore, there is also a fracture of the anterior superior L2 vertebral body corner on series 7, image 23. And the anterior L1-L2 disc space is likely disrupted, mildly distracted. The L1-L2 facet joints also appear mildly distracted and might be disrupted. Paraspinal and other soft tissues: Mild respiratory motion in the chest. Lung base atelectasis has not significantly changed from last year. No pericardial or pleural effusion. Calcified aortic  atherosclerosis. L1 and L2 level mild paraspinal muscle edema and/or contusion. Thoracic paraspinal soft tissues remain within normal limits. Visible abdominal viscera appear stable from last year. Disc levels: Thoracic disc spaces are stable from last year including some chronic retropulsion of T10 and T12. Distracted L1-L2 disc space and facets in association with L1 and L2 vertebral fractures, but no significant retropulsion or spondylolisthesis. IMPRESSION: 1. Positive for unstable Chance fracture of L1, with evidence of disrupted L1-L2 disc and associated small L2 anterior superior corner fracture. Possible disruption of the L1-L2 facet joints also. But aside from of the L1 vertebral body fragments, the L1 posterior elements are nondisplaced, no significant spondylolisthesis. Recommend spine surgery consultation and completion of lumbar imaging with dedicated Lumbar CT. 2. 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 No acute traumatic injury identified in the thoracic spine 3.  Aortic Atherosclerosis (ICD10-I70.0). Electronically Signed: By: Genevie Ann M.D. On: 04/03/2022 04:46  CT Cervical Spine Wo Contrast  Result Date: 04/03/2022 CLINICAL DATA:  88 year old female status post fall. EXAM: CT CERVICAL SPINE WITHOUT CONTRAST TECHNIQUE: Multidetector CT imaging of the cervical spine was performed without intravenous contrast. Multiplanar CT image reconstructions were also generated. RADIATION DOSE REDUCTION: This exam was performed according to the departmental dose-optimization program which includes automated exposure control, adjustment of the mA and/or kV according to patient size and/or use of iterative reconstruction technique. COMPARISON:  Head CT today.  Cervical spine CT 10/13/2021 FINDINGS: Alignment: Stable. Chronic straightening of lordosis, occasional mild degenerative anterolisthesis (C6-C7). Cervicothoracic junction  alignment is within normal limits. Bilateral posterior element alignment is within normal limits. Skull base and vertebrae: Visualized skull base is intact. No atlanto-occipital dissociation. C1 and C2 appear intact and aligned. No acute osseous abnormality identified. Soft tissues and spinal canal: No prevertebral fluid or swelling. No visible canal hematoma. Stable and negative for age neck soft tissues. Disc levels: Stable generally mild for age cervical spine degeneration. Disc and endplate degeneration at C5-C6 with suspected mild spinal stenosis. Upper chest: Negative. IMPRESSION: 1. No acute traumatic injury identified in the cervical spine. 2. Stable generally mild for age cervical spine degeneration. Chronic C5-C6 degeneration and mild spinal stenosis. Electronically Signed   By: Genevie Ann M.D.   On: 04/03/2022 04:35   CT Head Wo Contrast  Result Date: 04/03/2022 CLINICAL DATA:  88 year old female status post fall. EXAM: CT HEAD WITHOUT CONTRAST TECHNIQUE: Contiguous axial images were obtained from the base of the skull through the vertex without intravenous contrast. RADIATION DOSE REDUCTION: This exam was performed according to the departmental dose-optimization program which includes automated exposure control, adjustment of the mA and/or kV according to patient size and/or use of iterative reconstruction technique. COMPARISON:  Head CT 10/13/2021. FINDINGS: Brain: Stable and normal for age cerebral volume. No midline shift, ventriculomegaly, mass effect, evidence of mass lesion, intracranial hemorrhage or evidence of cortically based acute infarction. Stable gray-white differentiation, minimal to mild for age chronic white matter changes. Vascular: Calcified atherosclerosis at the skull base. No suspicious intracranial vascular hyperdensity. Skull: No acute osseous abnormality identified. Sinuses/Orbits: Visualized paranasal sinuses and mastoids are stable and well aerated. Other: Stable orbit and  scalp soft tissues. IMPRESSION: 1. No acute traumatic injury identified. 2. Stable and negative for age noncontrast CT appearance of the brain. Electronically Signed   By: Genevie Ann M.D.   On: 04/03/2022 04:32     Subjective: No acute issues or events overnight, tolerated PT quite well up to bedside chair today denies nausea vomiting diarrhea constipation any fevers chills or chest pain   Discharge Exam: Vitals:   04/18/22 0815 04/18/22 1058  BP: (!) 145/74 103/66  Pulse: (!) 50 (!) 52  Resp: 18 16  Temp: 98.2 F (36.8 C) 99.1 F (37.3 C)  SpO2: 93% 93%   Vitals:   04/17/22 2318 04/18/22 0325 04/18/22 0815 04/18/22 1058  BP: 135/71 (!) 123/58 (!) 145/74 103/66  Pulse: (!) 59 (!) 49 (!) 50 (!) 52  Resp: '18 18 18 16  '$ Temp: 99 F (37.2 C) 98.7 F (37.1 C) 98.2 F (36.8 C) 99.1 F (37.3 C)  TempSrc: Oral Oral Oral Oral  SpO2: 93% 94% 93% 93%    General: No acute respiratory distress - more alert today  Lungs: Without overt wheeze, difficult to auscultate through bracing Cardiovascular: Without JVD or carotid bruit, pulse regular Abdomen: NT/ND, soft, bowel sounds positive Extremities: No significant edema B LE    The  results of significant diagnostics from this hospitalization (including imaging, microbiology, ancillary and laboratory) are listed below for reference.     Microbiology: No results found for this or any previous visit (from the past 240 hour(s)).   Labs: BNP (last 3 results) No results for input(s): "BNP" in the last 8760 hours. Basic Metabolic Panel: Recent Labs  Lab 04/14/22 0554 04/17/22 0733  NA 132* 134*  K 3.6 4.0  CL 98 99  CO2 26 24  GLUCOSE 129* 121*  BUN 14 14  CREATININE 0.93 0.93  CALCIUM 9.2 8.9   Liver Function Tests: No results for input(s): "AST", "ALT", "ALKPHOS", "BILITOT", "PROT", "ALBUMIN" in the last 168 hours. No results for input(s): "LIPASE", "AMYLASE" in the last 168 hours. No results for input(s): "AMMONIA" in the last  168 hours. CBC: Recent Labs  Lab 04/14/22 0554 04/17/22 0733  WBC 8.7 6.4  HGB 16.3* 14.9  HCT 45.1 43.1  MCV 84.0 85.5  PLT 297 296   Cardiac Enzymes: No results for input(s): "CKTOTAL", "CKMB", "CKMBINDEX", "TROPONINI" in the last 168 hours. BNP: Invalid input(s): "POCBNP" CBG: No results for input(s): "GLUCAP" in the last 168 hours. D-Dimer No results for input(s): "DDIMER" in the last 72 hours. Hgb A1c No results for input(s): "HGBA1C" in the last 72 hours. Lipid Profile No results for input(s): "CHOL", "HDL", "LDLCALC", "TRIG", "CHOLHDL", "LDLDIRECT" in the last 72 hours. Thyroid function studies No results for input(s): "TSH", "T4TOTAL", "T3FREE", "THYROIDAB" in the last 72 hours.  Invalid input(s): "FREET3" Anemia work up No results for input(s): "VITAMINB12", "FOLATE", "FERRITIN", "TIBC", "IRON", "RETICCTPCT" in the last 72 hours. Urinalysis    Component Value Date/Time   COLORURINE STRAW (A) 10/13/2021 1812   APPEARANCEUR CLEAR 10/13/2021 1812   LABSPEC 1.013 10/13/2021 1812   PHURINE 8.0 10/13/2021 1812   GLUCOSEU NEGATIVE 10/13/2021 1812   HGBUR NEGATIVE 10/13/2021 1812   BILIRUBINUR NEGATIVE 10/13/2021 1812   KETONESUR NEGATIVE 10/13/2021 1812   PROTEINUR NEGATIVE 10/13/2021 1812   UROBILINOGEN 0.2 11/21/2014 0501   NITRITE NEGATIVE 10/13/2021 1812   LEUKOCYTESUR NEGATIVE 10/13/2021 1812   Sepsis Labs Recent Labs  Lab 04/14/22 0554 04/17/22 0733  WBC 8.7 6.4   Microbiology No results found for this or any previous visit (from the past 240 hour(s)).   Time coordinating discharge: Over 30 minutes  SIGNED:   Little Ishikawa, DO [facility] 04/18/2022, 11:57 AM Pager   If 7PM-7AM, please contact night-coverage [email or web address]

## 2022-04-18 NOTE — Evaluation (Signed)
Occupational Therapy Evaluation Patient Details Name: Erika Conley MRN: [id number] DOB: 21-Nov-1934 Today's Date: 04/18/2022   History of Present Illness Pt is a 88 y/o female presenting on 2/12 after fall in shower. Found with unstable L1 chance fracture. Was on bedrest until 2/26. PMH includes: HTN, fibromyalgia, dementia, anxiety, vertigo, R wrist fx s/p surgery, and prior back surgery.   Clinical Impression   PTA patient independent with ADLs, using rollator for mobility. Patient lives at independent living facility and reports frequent falls.  She was admitted for above and presents with problem list below.  TLSO adjusted in bed and educated on back precautions. Pt completing bed mobility with min assist, transfers and functional mobility in room with min assist +2 safety using RW, and setup to max assist required for ADLs. Pt remains at a high fall risk and will need assist to don/doff brace from bed level.  Believe pt will best benefit from further OT services acutely and after dc at SNF to optimize independence, safety with ADLs and mobility.      Recommendations for follow up therapy are one component of a multi-disciplinary discharge planning process, led by the attending physician.  Recommendations may be updated based on patient status, additional functional criteria and insurance authorization.   Follow Up Recommendations  Skilled nursing-short term rehab (<3 hours/day)     Assistance Recommended at Discharge Frequent or constant Supervision/Assistance  Patient can return home with the following A little help with walking and/or transfers;A lot of help with bathing/dressing/bathroom;Assistance with cooking/housework;Direct supervision/assist for medications management;Direct supervision/assist for financial management;Assist for transportation;Help with stairs or ramp for entrance    Functional Status Assessment  Patient has had a recent decline in their functional status and  demonstrates the ability to make significant improvements in function in a reasonable and predictable amount of time.  Equipment Recommendations  Other (comment) (defer)    Recommendations for Other Services       Precautions / Restrictions Precautions Precautions: Fall;Back;Other (comment) Precaution Booklet Issued: No Precaution Comments: TLSO in bed with HOB >30 degrees. Reviewed precuations with pt. Required Braces or Orthoses: Spinal Brace Spinal Brace: Thoracolumbosacral orthotic;Applied in supine position Restrictions Weight Bearing Restrictions: No      Mobility Bed Mobility Overal bed mobility: Needs Assistance Bed Mobility: Rolling, Sidelying to Sit Rolling: Min assist Sidelying to sit: Min assist, HOB elevated       General bed mobility comments: cueing for technique and trunk support to ascend.    Transfers Overall transfer level: Needs assistance Equipment used: Rolling walker (2 wheels) Transfers: Sit to/from Stand Sit to Stand: Min assist, +2 safety/equipment           General transfer comment: cueing for hand placement and safety, assist to power up with mild instability      Balance Overall balance assessment: Needs assistance Sitting-balance support: No upper extremity supported, Feet supported Sitting balance-Leahy Scale: Fair     Standing balance support: Bilateral upper extremity supported, No upper extremity supported, During functional activity Standing balance-Leahy Scale: Poor Standing balance comment: relies on BUE and external support                           ADL either performed or assessed with clinical judgement   ADL Overall ADL's : Needs assistance/impaired     Grooming: Minimal assistance;Wash/dry hands;Standing           Upper Body Dressing : Moderate assistance;Bed level Upper Body Dressing  Details (indicate cue type and reason): total assist for brace in supine Lower Body Dressing: Maximal  assistance;Sit to/from stand Lower Body Dressing Details (indicate cue type and reason): requires assist for socks, able to manage clothing over hips simulated Toilet Transfer: Minimal assistance;+2 for safety/equipment;Ambulation;Rolling walker (2 wheels)   Toileting- Clothing Manipulation and Hygiene: Minimal assistance;Bed level;Sit to/from stand       Functional mobility during ADLs: Minimal assistance;Rolling walker (2 wheels);+2 for safety/equipment       Vision Patient Visual Report: No change from baseline Additional Comments: reports difficulty with reading due to vision at baseline     Perception     Praxis      Pertinent Vitals/Pain Pain Assessment Pain Assessment: 0-10 Pain Score: 7  Pain Location: back (L side) Pain Descriptors / Indicators: Discomfort Pain Intervention(s): Limited activity within patient's tolerance, Monitored during session, Repositioned     Hand Dominance Right   Extremity/Trunk Assessment Upper Extremity Assessment Upper Extremity Assessment: Generalized weakness   Lower Extremity Assessment Lower Extremity Assessment: Defer to PT evaluation   Cervical / Trunk Assessment Cervical / Trunk Assessment: Other exceptions Cervical / Trunk Exceptions: L1 chance fx   Communication Communication Communication: No difficulties   Cognition Arousal/Alertness: Awake/alert Behavior During Therapy: WFL for tasks assessed/performed Overall Cognitive Status: History of cognitive impairments - at baseline                                 General Comments: pt with hx of mild dementia, she is oriented and follows commands with increased time.  Pleasant and cooperative throughout session.     General Comments  pt reports dizziness initally with sitting and standing but subsides    Exercises     Shoulder Instructions      Home Living Family/patient expects to be discharged to:: Skilled nursing facility                                  Additional Comments: from independent living      Prior Functioning/Environment Prior Level of Function : Independent/Modified Independent;History of Falls (last six months)             Mobility Comments: using rollator for mobility ADLs Comments: independent ADLs        OT Problem List: Decreased strength;Decreased activity tolerance;Impaired balance (sitting and/or standing);Decreased safety awareness;Decreased knowledge of use of DME or AE;Decreased knowledge of precautions;Pain      OT Treatment/Interventions: Self-care/ADL training;Therapeutic exercise;DME and/or AE instruction;Balance training;Patient/family education;Cognitive remediation/compensation;Therapeutic activities    OT Goals(Current goals can be found in the care plan section) Acute Rehab OT Goals Patient Stated Goal: get better OT Goal Formulation: With patient Time For Goal Achievement: 05/02/22 Potential to Achieve Goals: Good  OT Frequency: Min 2X/week    Co-evaluation PT/OT/SLP Co-Evaluation/Treatment: Yes Reason for Co-Treatment: To address functional/ADL transfers;For patient/therapist safety   OT goals addressed during session: ADL's and self-care      AM-PAC OT "6 Clicks" Daily Activity     Outcome Measure Help from another person eating meals?: None Help from another person taking care of personal grooming?: A Little Help from another person toileting, which includes using toliet, bedpan, or urinal?: A Little Help from another person bathing (including washing, rinsing, drying)?: A Lot Help from another person to put on and taking off regular upper body clothing?: A Little Help from  another person to put on and taking off regular lower body clothing?: A Lot 6 Click Score: 17   End of Session Equipment Utilized During Treatment: Gait belt;Rolling walker (2 wheels);Back brace Nurse Communication: Mobility status  Activity Tolerance: Patient tolerated treatment  well Patient left: in chair;with call bell/phone within reach;with nursing/sitter in room;Other (comment) (chair alarm not set, no chair alarm box- student RN in room and aware, alerted RN who was looking for chair alarm)  OT Visit Diagnosis: Other abnormalities of gait and mobility (R26.89);Muscle weakness (generalized) (M62.81);History of falling (Z91.81);Pain Pain - Right/Left: Left Pain - part of body:  (back)                Time: [id number] OT Time Calculation (min): 39 min Charges:  OT General Charges $OT Visit: 1 Visit OT Evaluation $OT Eval Moderate Complexity: 1 Mod OT Treatments $Self Care/Home Management : 8-22 mins  Jolaine Artist, OT Acute Rehabilitation Services Office [phone]   Delight Stare 04/18/2022, 10:08 AM

## 2022-04-18 NOTE — Progress Notes (Signed)
Report called to Caryl Pina RN at [facility]. Communicated with bedside RN.

## 2022-05-20 ENCOUNTER — Emergency Department (HOSPITAL_COMMUNITY)
Admission: EM | Admit: 2022-05-20 | Discharge: 2022-05-20 | Disposition: A | Discharge status: Home / Self Care | Payer: Medicare Other | Attending: Student | Admitting: Student

## 2022-05-20 DIAGNOSIS — R21 Rash and other nonspecific skin eruption: Secondary | ICD-10-CM | POA: Insufficient documentation

## 2022-05-20 DIAGNOSIS — R300 Dysuria: Secondary | ICD-10-CM | POA: Insufficient documentation

## 2022-05-20 LAB — URINALYSIS, ROUTINE W REFLEX MICROSCOPIC
Bilirubin Urine: NEGATIVE
Glucose, UA: NEGATIVE mg/dL
Hgb urine dipstick: NEGATIVE
Ketones, ur: 5 mg/dL — AB
Leukocytes,Ua: NEGATIVE
Nitrite: NEGATIVE
Protein, ur: NEGATIVE mg/dL
Specific Gravity, Urine: 1.013 (ref 1.005–1.030)
pH: 8 (ref 5.0–8.0)

## 2022-05-20 LAB — COMPREHENSIVE METABOLIC PANEL
ALT: 8 U/L (ref 0–44)
AST: 14 U/L — ABNORMAL LOW (ref 15–41)
Albumin: 4 g/dL (ref 3.5–5.0)
Alkaline Phosphatase: 59 U/L (ref 38–126)
Anion gap: 10 (ref 5–15)
BUN: 14 mg/dL (ref 8–23)
CO2: 28 mmol/L (ref 22–32)
Calcium: 9 mg/dL (ref 8.9–10.3)
Chloride: 99 mmol/L (ref 98–111)
Creatinine, Ser: 0.6 mg/dL (ref 0.44–1.00)
GFR, Estimated: >60 mL/min (ref >60)
Glucose, Bld: 98 mg/dL (ref 70–99)
Potassium: 3.2 mmol/L — ABNORMAL LOW (ref 3.5–5.1)
Sodium: 137 mmol/L (ref 135–145)
Total Bilirubin: 0.8 mg/dL (ref 0.3–1.2)
Total Protein: 6.1 g/dL — ABNORMAL LOW (ref 6.5–8.1)

## 2022-05-20 LAB — CBC
HCT: 38.4 % (ref 36.0–46.0)
Hemoglobin: 13.8 g/dL (ref 12.0–15.0)
MCH: 30.5 pg (ref 26.0–34.0)
MCHC: 35.9 g/dL (ref 30.0–36.0)
MCV: 84.8 fL (ref 80.0–100.0)
Platelets: 197 10*3/uL (ref 150–400)
RBC: 4.53 MIL/uL (ref 3.87–5.11)
RDW: 13.4 % (ref 11.5–15.5)
WBC: 5.2 10*3/uL (ref 4.0–10.5)
nRBC: 0 % (ref 0.0–0.2)

## 2022-05-20 MED ORDER — KETOCONAZOLE 2 % EX CREA: 1.0000 | TOPICAL_CREAM | Freq: Every day | CUTANEOUS | 0 refills | Status: DC

## 2022-05-20 MED ORDER — HYDROCODONE-ACETAMINOPHEN 5-325 MG PO TABS
1.0000 | ORAL_TABLET | Freq: Once | ORAL | Status: AC
  Administered 2022-05-20: 1 via ORAL
  Filled 2022-05-20: qty 1

## 2022-05-20 MED ORDER — ONDANSETRON HCL 4 MG/2ML IJ SOLN
4.0000 mg | Freq: Once | INTRAMUSCULAR | Status: AC
  Administered 2022-05-20: 4 mg via INTRAVENOUS
  Filled 2022-05-20: qty 2

## 2022-05-20 MED ORDER — DIPHENHYDRAMINE HCL 50 MG/ML IJ SOLN
12.5000 mg | Freq: Once | INTRAMUSCULAR | Status: AC
  Administered 2022-05-20: 12.5 mg via INTRAVENOUS
  Filled 2022-05-20: qty 1

## 2022-05-20 MED ORDER — HYDROXYZINE HCL 10 MG PO TABS: 10.0000 mg | ORAL_TABLET | Freq: Four times a day (QID) | ORAL | 0 refills | Status: AC | PRN

## 2022-05-20 MED ORDER — FENTANYL CITRATE PF 50 MCG/ML IJ SOSY
50.0000 ug | PREFILLED_SYRINGE | Freq: Once | INTRAMUSCULAR | Status: AC
  Administered 2022-05-20: 50 ug via INTRAVENOUS
  Filled 2022-05-20: qty 1

## 2022-05-20 NOTE — ED Provider Notes (Signed)
[place] AT [facility] Provider Note   CSN: [id number] Arrival date & time: 05/20/22  1440     History {Add pertinent medical, surgical, social history, OB history to HPI:1} Chief Complaint  Patient presents with   Dysuria    MARGUERETE LAVERDURE is a 88 y.o. female who presents emergency department with chief complaint of dysuria.  Patient comes to the emergency department from assisted living facility.  She is complaining of severe burning with urination.  Patient states that she feels "terrible."  She is also suffering from lumbar compression fracture and was recently discharged from skilled nursing facility.  She is having significant pain from that as well.  She is also complaining of diffuse pruritic skin without rash.  She denies hematuria, flank pain or vomiting.   Dysuria      Home Medications Prior to Admission medications   Medication Sig Start Date End Date Taking? Authorizing Provider  acetaminophen (TYLENOL) 325 MG tablet Take 1 tablet (325 mg total) by mouth every 4 (four) hours as needed for headache. 04/18/22   Little Ishikawa, MD  amLODipine (NORVASC) 10 MG tablet Take 1 tablet (10 mg total) by mouth daily. 04/19/22   Little Ishikawa, MD  Biotin 5000 MCG CAPS Take 5,000 mcg by mouth in the morning.    [provider]  busPIRone (BUSPAR) 30 MG tablet Take 30 mg by mouth in the morning and at bedtime.    [provider]  calcium carbonate (TUMS - DOSED IN MG ELEMENTAL CALCIUM) 500 MG chewable tablet Chew 2-3 tablets by mouth 3 (three) times daily with meals as needed (for GERD).    [provider]  calcium-vitamin D (OSCAL WITH D) 500-5 MG-MCG tablet Take 2 tablets by mouth 2 (two) times daily with a meal. 04/18/22   Little Ishikawa, MD  Carboxymethylcellul-Glycerin (REFRESH OPTIVE PF OP) Place 1 drop into both eyes every 4 (four) hours as needed (for dryness).    [provider]  cetirizine  (ZYRTEC) 10 MG tablet Take 10 mg by mouth daily.    [provider]  diclofenac Sodium (VOLTAREN) 1 % GEL Apply 2 g topically every 12 (twelve) hours as needed (for bilateral knees or back (PAIN)).    [provider]  dicyclomine (BENTYL) 20 MG tablet Take 20 mg by mouth 3 (three) times daily.    [provider]  donepezil (ARICEPT) 10 MG tablet Take 10 mg by mouth at bedtime.    [provider]  famotidine (PEPCID) 20 MG tablet Take 20 mg by mouth 2 (two) times daily.    [provider]  fluticasone (FLONASE) 50 MCG/ACT nasal spray Place 2 sprays into both nostrils daily.    [provider]  levothyroxine (SYNTHROID) 100 MCG tablet Take 100 mcg by mouth daily before breakfast.    [provider]  loperamide (IMODIUM A-D) 2 MG tablet Take 4 mg by mouth See admin instructions. Take 4 mg by mouth after 1st loose stool movement, as directed    [provider]  LORazepam (ATIVAN) 0.5 MG tablet Take 1 tablet (0.5 mg total) by mouth every evening. 04/18/22   Little Ishikawa, MD  meclizine (ANTIVERT) 25 MG tablet Take 25 mg by mouth 3 (three) times daily as needed for dizziness.    [provider]  metoprolol tartrate (LOPRESSOR) 25 MG tablet Take 0.5 tablets (12.5 mg total) by mouth 2 (two) times daily. 04/18/22   Little Ishikawa,  MD  Multiple Vitamins-Minerals (PRESERVISION AREDS 2) CAPS Take 1 capsule by mouth in the morning and at bedtime.    [provider]  omeprazole (PRILOSEC) 40 MG capsule Take 1 capsule (40 mg total) by mouth 2 (two) times daily. Take shortly before breakfast and dinner meal Patient taking differently: Take 40 mg by mouth See admin instructions. Take 40 mg by mouth before breakfast and evening meal 07/16/18   Milus Banister, MD  senna-docusate (SENOKOT-S) 8.6-50 MG tablet Take 1 tablet by mouth at bedtime as needed for mild constipation. 04/18/22   Little Ishikawa, MD  sertraline  (ZOLOFT) 100 MG tablet Take 200 mg by mouth in the morning.    [provider]  traZODone (DESYREL) 100 MG tablet Take 1.5 tablets (150 mg total) by mouth at bedtime. 12/12/18   Nicole Kindred A, DO  VAGISIL 1 % CREA Apply 1 Application topically 4 (four) times daily as needed (for itching).    [provider]      Allergies    Chocolate, Codeine, Cortisone, Guggulipid-black pepper, Morphine, Nsaids, and Other    Review of Systems   Review of Systems  Genitourinary:  Positive for dysuria.    Physical Exam Updated Vital Signs BP 123/75 (BP Location: Right Arm)   Pulse (!) 56   Temp 98.3 F (36.8 C) (Oral)   Resp 16   SpO2 96%  Physical Exam Vitals and nursing note reviewed.  Constitutional:      General: She is not in acute distress.    Appearance: She is well-developed. She is not diaphoretic.  HENT:     Head: Normocephalic and atraumatic.     Right Ear: External ear normal.     Left Ear: External ear normal.     Nose: Nose normal.     Mouth/Throat:     Mouth: Mucous membranes are moist.  Eyes:     General: No scleral icterus.    Conjunctiva/sclera: Conjunctivae normal.  Cardiovascular:     Rate and Rhythm: Normal rate and regular rhythm.     Heart sounds: Normal heart sounds. No murmur heard.    No friction rub. No gallop.  Pulmonary:     Effort: Pulmonary effort is normal. No respiratory distress.     Breath sounds: Normal breath sounds.  Abdominal:     General: Bowel sounds are normal. There is no distension.     Palpations: Abdomen is soft. There is no mass.     Tenderness: There is no abdominal tenderness. There is no right CVA tenderness, left CVA tenderness or guarding.  Musculoskeletal:     Cervical back: Normal range of motion.  Skin:    General: Skin is warm and dry.  Neurological:     Mental Status: She is alert and oriented to person, place, and time.  Psychiatric:        Behavior: Behavior normal.     ED Results / Procedures /  Treatments   Labs (all labs ordered are listed, but only abnormal results are displayed) Labs Reviewed  URINALYSIS, ROUTINE W REFLEX MICROSCOPIC  CBC  COMPREHENSIVE METABOLIC PANEL    EKG None  Radiology No results found.  Procedures Procedures  {Document cardiac monitor, telemetry assessment procedure when appropriate:1}  Medications Ordered in ED Medications  fentaNYL (SUBLIMAZE) injection 50 mcg (has no administration in time range)  ondansetron (ZOFRAN) injection 4 mg (has no administration in time range)    ED Course/ Medical Decision Making/ A&P   {  Click here for ABCD2, HEART and other calculatorsREFRESH Note before signing :1}                          Medical Decision Making 88 year old female with history of dementia who presents emergency department dysuria.  Differential diagnosis includes UTI, vaginitis, interstitial cystitis.  I ordered labs.  CBC with elevated white blood cell count, CMP with mild hypokalemia of insignificant value.  Urine does not appear to be infected.  There is a small amount of bacteria without white blood cells, ketones, or hematuria. Patient may have vaginitis.  Will order ketoconazole.  She is also complaining of pruritus.  She was treated with Benadryl with significant improvement in her symptoms.  Patient appears otherwise appropriate for discharge at this time.  Urine sent for culture.    Amount and/or Complexity of Data Reviewed Labs: ordered.  Risk Prescription drug management.   ***  {Document critical care time when appropriate:1} {Document review of labs and clinical decision tools ie heart score, Chads2Vasc2 etc:1}  {Document your independent review of radiology images, and any outside records:1} {Document your discussion with family members, caretakers, and with consultants:1} {Document social determinants of health affecting pt's care:1} {Document your decision making why or why not admission, treatments were  needed:1} Final Clinical Impression(s) / ED Diagnoses Final diagnoses:  None    Rx / DC Orders ED Discharge Orders     None

## 2022-05-20 NOTE — ED Triage Notes (Signed)
BIBA from [facility] for vaginal pain, burning with urination, diarrhea. 150/80 55 94% 98.4 temp

## 2022-05-20 NOTE — Discharge Instructions (Signed)
Get help right away if: Your pain is severe and not relieved with medicines. You cannot eat or drink without vomiting. You are confused. You have a rapid heartbeat while resting. You have shaking or chills. You feel extremely weak. 

## 2022-05-22 LAB — URINE CULTURE

## 2022-07-21 ENCOUNTER — Emergency Department (HOSPITAL_COMMUNITY)
Admission: EM | Admit: 2022-07-21 | Discharge: 2022-07-21 | Disposition: A | Discharge status: Home / Self Care | Payer: Medicare Other | Attending: Emergency Medicine | Admitting: Emergency Medicine

## 2022-07-21 ENCOUNTER — Emergency Department (HOSPITAL_COMMUNITY): Payer: Medicare Other

## 2022-07-21 ENCOUNTER — Other Ambulatory Visit: Payer: Self-pay

## 2022-07-21 DIAGNOSIS — S20212A Contusion of left front wall of thorax, initial encounter: Secondary | ICD-10-CM

## 2022-07-21 DIAGNOSIS — R0789 Other chest pain: Secondary | ICD-10-CM | POA: Diagnosis present

## 2022-07-21 DIAGNOSIS — S0990XA Unspecified injury of head, initial encounter: Secondary | ICD-10-CM | POA: Diagnosis not present

## 2022-07-21 DIAGNOSIS — W19XXXA Unspecified fall, initial encounter: Secondary | ICD-10-CM

## 2022-07-21 LAB — CBC
HCT: 39.9 % (ref 36.0–46.0)
Hemoglobin: 13.5 g/dL (ref 12.0–15.0)
MCH: 29.9 pg (ref 26.0–34.0)
MCHC: 33.8 g/dL (ref 30.0–36.0)
MCV: 88.5 fL (ref 80.0–100.0)
Platelets: 178 10*3/uL (ref 150–400)
RBC: 4.51 MIL/uL (ref 3.87–5.11)
RDW: 13.4 % (ref 11.5–15.5)
WBC: 5 10*3/uL (ref 4.0–10.5)
nRBC: 0 % (ref 0.0–0.2)

## 2022-07-21 LAB — BASIC METABOLIC PANEL
Anion gap: 7 (ref 5–15)
BUN: 11 mg/dL (ref 8–23)
CO2: 29 mmol/L (ref 22–32)
Calcium: 8.5 mg/dL — ABNORMAL LOW (ref 8.9–10.3)
Chloride: 103 mmol/L (ref 98–111)
Creatinine, Ser: 0.7 mg/dL (ref 0.44–1.00)
GFR, Estimated: >60 mL/min (ref >60)
Glucose, Bld: 159 mg/dL — ABNORMAL HIGH (ref 70–99)
Potassium: 3.6 mmol/L (ref 3.5–5.1)
Sodium: 139 mmol/L (ref 135–145)

## 2022-07-21 MED ORDER — ACETAMINOPHEN 325 MG PO TABS
650.0000 mg | ORAL_TABLET | Freq: Once | ORAL | Status: AC
  Administered 2022-07-21: 650 mg via ORAL
  Filled 2022-07-21: qty 2

## 2022-07-21 NOTE — ED Provider Notes (Signed)
Erika Conley AT [facility] Provider Note   CSN: [id number] Arrival date & time: 07/21/22  1015     History  Chief Complaint  Patient presents with  . Fall    Erika Conley is a 88 y.o. female.   Fall     Patient has a history of arthritis hypertension depression anxiety reflux fibromyalgia and a closed fracture of her first lumbar vertebrae back in February of this year.  Patient states she lives at a nursing facility.  She was walking with her walker when she lost her balance this morning and fell backwards.  Patient struck her head and neck.  She also is having pain primarily in her left ribs and her left shoulder.  Patient did hit her head.  She denies loss of consciousness.  She does not have any numbness or weakness.  No vomiting or diarrhea  Home Medications Prior to Admission medications   Medication Sig Start Date End Date Taking? Authorizing Provider  acetaminophen (TYLENOL) 325 MG tablet Take 1 tablet (325 mg total) by mouth every 4 (four) hours as needed for headache. 04/18/22  Yes Azucena Fallen, MD  amLODipine (NORVASC) 10 MG tablet Take 1 tablet (10 mg total) by mouth daily. 04/19/22  Yes Azucena Fallen, MD  Biotin 5000 MCG CAPS Take 5,000 mcg by mouth in the morning.   Yes [provider]  busPIRone (BUSPAR) 30 MG tablet Take 30 mg by mouth in the morning and at bedtime.   Yes [provider]  calcium carbonate (TUMS - DOSED IN MG ELEMENTAL CALCIUM) 500 MG chewable tablet Chew 2-3 tablets by mouth 3 (three) times daily with meals as needed (for GERD).   Yes [provider]  calcium-vitamin D (OSCAL WITH D) 500-5 MG-MCG tablet Take 2 tablets by mouth 2 (two) times daily with a meal. 04/18/22  Yes Azucena Fallen, MD  cetirizine (ZYRTEC) 10 MG tablet Take 10 mg by mouth daily.   Yes [provider]  dicyclomine (BENTYL) 20 MG tablet Take 20 mg by mouth 3 (three) times daily.   Yes  [provider]  donepezil (ARICEPT) 10 MG tablet Take 10 mg by mouth at bedtime.   Yes [provider]  famotidine (PEPCID) 20 MG tablet Take 20 mg by mouth 2 (two) times daily.   Yes [provider]  fluticasone (FLONASE) 50 MCG/ACT nasal spray Place 2 sprays into both nostrils daily.   Yes [provider]  HYDROcodone-acetaminophen (NORCO/VICODIN) 5-325 MG tablet Take 1-2 tablets by mouth every 4 (four) hours as needed for moderate pain.   Yes [provider]  Infant Care Products [facility]) OINT Apply 1 Application topically 3 (three) times daily. To buttocks   Yes [provider]  levothyroxine (SYNTHROID) 100 MCG tablet Take 100 mcg by mouth daily before breakfast.   Yes [provider]  loperamide (IMODIUM A-D) 2 MG tablet Take 4 mg by mouth See admin instructions. Take 4 mg by mouth after 1st loose stool movement, and an additional tablet with each loose stool. (Max 8 tablets).   Yes [provider]  LORazepam (ATIVAN) 0.5 MG tablet Take 1 tablet (0.5 mg total) by mouth every evening. 04/18/22  Yes Azucena Fallen, MD  metoprolol tartrate (LOPRESSOR) 25 MG tablet Take 0.5 tablets (12.5 mg total) by mouth 2 (two) times daily. 04/18/22  Yes Azucena Fallen, MD  Multiple Vitamins-Minerals (PRESERVISION AREDS 2) CAPS Take 1 capsule by mouth in  the morning and at bedtime.   Yes [provider]  omeprazole (PRILOSEC) 40 MG capsule Take 1 capsule (40 mg total) by mouth 2 (two) times daily. Take shortly before breakfast and dinner meal Patient taking differently: Take 40 mg by mouth 2 (two) times daily. 07/16/18  Yes Rachael Fee, MD  sertraline (ZOLOFT) 100 MG tablet Take 200 mg by mouth in the morning.   Yes [provider]  traZODone (DESYREL) 100 MG tablet Take 1.5 tablets (150 mg total) by mouth at bedtime. 12/12/18  Yes Esaw Grandchild A, DO  hydrOXYzine (ATARAX) 10 MG tablet Take 1 tablet (10  mg total) by mouth every 6 (six) hours as needed for itching. Patient not taking: Reported on 07/21/2022 05/20/22   Arthor Captain, PA-C  ketoconazole (NIZORAL) 2 % cream Apply 1 Application topically daily. Apply to the vulva once a day for 14 days Patient not taking: Reported on 07/21/2022 05/20/22   Arthor Captain, PA-C  senna-docusate (SENOKOT-S) 8.6-50 MG tablet Take 1 tablet by mouth at bedtime as needed for mild constipation. Patient not taking: Reported on 07/21/2022 04/18/22   Azucena Fallen, MD      Allergies    Chocolate, Codeine, Cortisone, Guggulipid-black pepper, Morphine, Nsaids, and Other    Review of Systems   Review of Systems  Physical Exam Updated Vital Signs BP (!) 149/64   Pulse (!) 52   Temp 98.7 F (37.1 C) (Oral)   Resp 15   SpO2 93%  Physical Exam Vitals and nursing note reviewed.  Constitutional:      General: She is not in acute distress.    Appearance: She is well-developed.  HENT:     Head: Normocephalic and atraumatic.     Right Ear: External ear normal.     Left Ear: External ear normal.  Eyes:     General: No scleral icterus.       Right eye: No discharge.        Left eye: No discharge.     Conjunctiva/sclera: Conjunctivae normal.  Neck:     Trachea: No tracheal deviation.  Cardiovascular:     Rate and Rhythm: Normal rate and regular rhythm.  Pulmonary:     Effort: Pulmonary effort is normal. No respiratory distress.     Breath sounds: Normal breath sounds. No stridor. No wheezing or rales.  Chest:     Chest wall: Tenderness present.     Comments: Tenderness palpation left chest wall Abdominal:     General: Bowel sounds are normal. There is no distension.     Palpations: Abdomen is soft.     Tenderness: There is no abdominal tenderness. There is no guarding or rebound.  Musculoskeletal:        General: No deformity.     Left shoulder: Tenderness present. No deformity.     Left upper arm: Normal.     Left elbow: Normal.     Left  forearm: Normal.     Cervical back: Neck supple.     Comments: No tenderness palpation bilateral hips knees or ankles, no tenderness palpation right upper extremity  Skin:    General: Skin is warm and dry.     Findings: No rash.  Neurological:     General: No focal deficit present.     Mental Status: She is alert.     Cranial Nerves: No cranial nerve deficit, dysarthria or facial asymmetry.     Sensory: No sensory deficit.     Motor: No  abnormal muscle tone or seizure activity.     Coordination: Coordination normal.  Psychiatric:        Mood and Affect: Mood normal.     ED Results / Procedures / Treatments   Labs (all labs ordered are listed, but only abnormal results are displayed) Labs Reviewed  BASIC METABOLIC PANEL - Abnormal; Notable for the following components:      Result Value   Glucose, Bld 159 (*)    Calcium 8.5 (*)    All other components within normal limits  CBC    EKG None  Radiology DG Ribs Unilateral W/Chest Left  Result Date: 07/21/2022 CLINICAL DATA:  Fall. Pain. Left-sided rib pain. Patient wearing a back brace after fall in February and acute fractures seen at L1 and L2. EXAM: LEFT RIBS AND CHEST - 3+ VIEW; THORACIC SPINE 2 VIEWS; LUMBAR SPINE - COMPLETE 4+ VIEW COMPARISON:  Chest radiographs 10/13/2021 and 02/14/2021; CT thoracic spine and CT lumbar spine 04/03/2022, CT abdomen and pelvis 10/22/2015 FINDINGS: Chest and left ribs: Cardiac silhouette and mediastinal contours are unchanged and within normal limits. Mild calcification within the aortic arch. Mildly decreased lung volumes. The lungs are clear. Chronic right costophrenic angle blunting is unchanged from 02/14/2021, likely scarring as also seen on prior CT. No pleural effusion or pneumothorax. Within the limitations of diffuse decreased bone mineralization, no definitive acute left rib fracture is identified. Old healed left posterior tenth rib fracture is unchanged from 10/13/2021 CT. Right upper  quadrant abdominal cholecystectomy surgical clips. Thoracic spine: There are 12 rib-bearing thoracic type vertebral bodies. There is again augmentation cement seen within the T11 and T12 vertebral bodies. High-grade anterior T10 vertebral body height loss is unchanged from 10/22/2015 and chronic. Moderate multilevel disc space narrowing. No definite new thoracic vertebral body compression fracture. Lumbar spine: There are 5 non-rib-bearing lumbar-type vertebral bodies. Note is made of an acute Chance fracture of the L1 vertebral body seen on 04/03/2022 CT; no definite new L1 vertebral body height loss is seen. There is mild interval healing sclerosis at the anterior superior aspect of the L2 vertebral body corresponding to the region of the small oblique fracture seen on prior CT. There is minimal retrolisthesis of L2 on L3 and L1 on L2, similar to 04/03/2022 CT. Severe L5-S1 and moderate posterior L4-5 disc space narrowing is similar to prior. IMPRESSION: 1. Within the limitations of diffuse decreased bone mineralization, no definitive acute left rib fracture is identified. 2. Within the limitations of decreased bone mineralization, redemonstration of L1 Chance fracture previously seen acutely on 04/03/2022 CT; no new L1 vertebral body height loss is seen. Interval healing sclerosis at the anterior superior L2 vertebral body for an acute fracture was seen on 04/03/2022 CT. 3. High-grade anterior T10 vertebral body height loss is unchanged from 10/22/2015 and chronic. 4. Unchanged augmentation cement within the T11 and T12 vertebral bodies. Electronically Signed   By: Neita Garnet M.D.   On: 07/21/2022 13:11   DG Thoracic Spine 2 View  Result Date: 07/21/2022 CLINICAL DATA:  Fall. Pain. Left-sided rib pain. Patient wearing a back brace after fall in February and acute fractures seen at L1 and L2. EXAM: LEFT RIBS AND CHEST - 3+ VIEW; THORACIC SPINE 2 VIEWS; LUMBAR SPINE - COMPLETE 4+ VIEW COMPARISON:  Chest  radiographs 10/13/2021 and 02/14/2021; CT thoracic spine and CT lumbar spine 04/03/2022, CT abdomen and pelvis 10/22/2015 FINDINGS: Chest and left ribs: Cardiac silhouette and mediastinal contours are unchanged and within normal limits. Mild  calcification within the aortic arch. Mildly decreased lung volumes. The lungs are clear. Chronic right costophrenic angle blunting is unchanged from 02/14/2021, likely scarring as also seen on prior CT. No pleural effusion or pneumothorax. Within the limitations of diffuse decreased bone mineralization, no definitive acute left rib fracture is identified. Old healed left posterior tenth rib fracture is unchanged from 10/13/2021 CT. Right upper quadrant abdominal cholecystectomy surgical clips. Thoracic spine: There are 12 rib-bearing thoracic type vertebral bodies. There is again augmentation cement seen within the T11 and T12 vertebral bodies. High-grade anterior T10 vertebral body height loss is unchanged from 10/22/2015 and chronic. Moderate multilevel disc space narrowing. No definite new thoracic vertebral body compression fracture. Lumbar spine: There are 5 non-rib-bearing lumbar-type vertebral bodies. Note is made of an acute Chance fracture of the L1 vertebral body seen on 04/03/2022 CT; no definite new L1 vertebral body height loss is seen. There is mild interval healing sclerosis at the anterior superior aspect of the L2 vertebral body corresponding to the region of the small oblique fracture seen on prior CT. There is minimal retrolisthesis of L2 on L3 and L1 on L2, similar to 04/03/2022 CT. Severe L5-S1 and moderate posterior L4-5 disc space narrowing is similar to prior. IMPRESSION: 1. Within the limitations of diffuse decreased bone mineralization, no definitive acute left rib fracture is identified. 2. Within the limitations of decreased bone mineralization, redemonstration of L1 Chance fracture previously seen acutely on 04/03/2022 CT; no new L1 vertebral body  height loss is seen. Interval healing sclerosis at the anterior superior L2 vertebral body for an acute fracture was seen on 04/03/2022 CT. 3. High-grade anterior T10 vertebral body height loss is unchanged from 10/22/2015 and chronic. 4. Unchanged augmentation cement within the T11 and T12 vertebral bodies. Electronically Signed   By: Neita Garnet M.D.   On: 07/21/2022 13:11   DG Lumbar Spine Complete  Result Date: 07/21/2022 CLINICAL DATA:  Fall. Pain. Left-sided rib pain. Patient wearing a back brace after fall in February and acute fractures seen at L1 and L2. EXAM: LEFT RIBS AND CHEST - 3+ VIEW; THORACIC SPINE 2 VIEWS; LUMBAR SPINE - COMPLETE 4+ VIEW COMPARISON:  Chest radiographs 10/13/2021 and 02/14/2021; CT thoracic spine and CT lumbar spine 04/03/2022, CT abdomen and pelvis 10/22/2015 FINDINGS: Chest and left ribs: Cardiac silhouette and mediastinal contours are unchanged and within normal limits. Mild calcification within the aortic arch. Mildly decreased lung volumes. The lungs are clear. Chronic right costophrenic angle blunting is unchanged from 02/14/2021, likely scarring as also seen on prior CT. No pleural effusion or pneumothorax. Within the limitations of diffuse decreased bone mineralization, no definitive acute left rib fracture is identified. Old healed left posterior tenth rib fracture is unchanged from 10/13/2021 CT. Right upper quadrant abdominal cholecystectomy surgical clips. Thoracic spine: There are 12 rib-bearing thoracic type vertebral bodies. There is again augmentation cement seen within the T11 and T12 vertebral bodies. High-grade anterior T10 vertebral body height loss is unchanged from 10/22/2015 and chronic. Moderate multilevel disc space narrowing. No definite new thoracic vertebral body compression fracture. Lumbar spine: There are 5 non-rib-bearing lumbar-type vertebral bodies. Note is made of an acute Chance fracture of the L1 vertebral body seen on 04/03/2022 CT; no  definite new L1 vertebral body height loss is seen. There is mild interval healing sclerosis at the anterior superior aspect of the L2 vertebral body corresponding to the region of the small oblique fracture seen on prior CT. There is minimal retrolisthesis of L2 on  L3 and L1 on L2, similar to 04/03/2022 CT. Severe L5-S1 and moderate posterior L4-5 disc space narrowing is similar to prior. IMPRESSION: 1. Within the limitations of diffuse decreased bone mineralization, no definitive acute left rib fracture is identified. 2. Within the limitations of decreased bone mineralization, redemonstration of L1 Chance fracture previously seen acutely on 04/03/2022 CT; no new L1 vertebral body height loss is seen. Interval healing sclerosis at the anterior superior L2 vertebral body for an acute fracture was seen on 04/03/2022 CT. 3. High-grade anterior T10 vertebral body height loss is unchanged from 10/22/2015 and chronic. 4. Unchanged augmentation cement within the T11 and T12 vertebral bodies. Electronically Signed   By: Neita Garnet M.D.   On: 07/21/2022 13:11   DG Shoulder Left  Result Date: 07/21/2022 CLINICAL DATA:  Fall. Pain. Fell backwards. Left rib and arm pain. EXAM: LEFT SHOULDER - 2+ VIEW COMPARISON:  Left humerus radiographs 02/16/2013 FINDINGS: On the prior remote 02/16/2013 left humerus radiographs, there was an acute fracture of the proximal left humerus. There is now a posttraumatic deformity with mild foreshortening at the level of the proximal left humerus surgical neck from fracture impaction, and mild lateral displacement of the distal fracture component of the humeral diaphysis with respect to the humeral head. Moderate to severe inferior glenohumeral joint space narrowing and peripheral osteophytosis. A well corticated apparent loose body at the superior glenohumeral joint appears unchanged from 2014. Mild-to-moderate acromioclavicular joint space narrowing and peripheral osteophytosis. No  definite acute fracture is seen. Moderate to high-grade atherosclerotic calcifications within the aortic arch. IMPRESSION: There is a posttraumatic deformity at the level of the proximal left humerus surgical neck from prior mildly displaced and impacted fracture seen acutely on 02/16/2013 comparison radiographs. Moderate to severe glenohumeral osteoarthritis. No acute fracture is seen. Electronically Signed   By: Neita Garnet M.D.   On: 07/21/2022 12:50   CT Cervical Spine Wo Contrast  Result Date: 07/21/2022 CLINICAL DATA:  88 year old female status post fall backwards. Pain. EXAM: CT CERVICAL SPINE WITHOUT CONTRAST TECHNIQUE: Multidetector CT imaging of the cervical spine was performed without intravenous contrast. Multiplanar CT image reconstructions were also generated. RADIATION DOSE REDUCTION: This exam was performed according to the departmental dose-optimization program which includes automated exposure control, adjustment of the mA and/or kV according to patient size and/or use of iterative reconstruction technique. COMPARISON:  Head CT today.  Cervical spine CT 04/03/2022. Thoracic spine CT 04/03/2022. FINDINGS: Alignment: Mildly improved cervical lordosis since February. Stable mild degenerative appearing anterolisthesis of C6 on C7. Cervicothoracic junction alignment is within normal limits. Bilateral posterior element alignment is within normal limits. Skull base and vertebrae: Stable bone mineralization. Visualized skull base is intact. No atlanto-occipital dissociation. No acute osseous abnormality identified. Soft tissues and spinal canal: No prevertebral fluid or swelling. No visible canal hematoma. Disc levels: Stable cervical spine degeneration, most pronounced at C1-C2 (superimposed on right C2-C3 facet ankylosis) and C5-C6 (superimposed on C7-T1 facet ankylosis. Upper chest: T3 compression fracture increased compared to 04/03/2022 but age indeterminate and partially sclerotic. T1 and T2  appears stable and intact. Negative lung apices. Stable chronic right thyroid nodularity since 2016 Stability for greater than 5 years implies benignity; no biopsy or followup indicated (ref: J Am Coll Radiol. 2015 Feb;12(2): 143-50). IMPRESSION: 1. No acute traumatic injury identified in the cervical spine. 2. A T3 compression fracture appears increased since February but is likely nonacute. Electronically Signed   By: Odessa Fleming M.D.   On: 07/21/2022 11:54  CT Head Wo Contrast  Result Date: 07/21/2022 CLINICAL DATA:  88 year old female status post fall backwards. Pain. EXAM: CT HEAD WITHOUT CONTRAST TECHNIQUE: Contiguous axial images were obtained from the base of the skull through the vertex without intravenous contrast. RADIATION DOSE REDUCTION: This exam was performed according to the departmental dose-optimization program which includes automated exposure control, adjustment of the mA and/or kV according to patient size and/or use of iterative reconstruction technique. COMPARISON:  Brain MRI 11/21/2014.  Head CT 04/03/2022. FINDINGS: Brain: Stable cerebral volume. No midline shift, ventriculomegaly, mass effect, evidence of mass lesion, intracranial hemorrhage or evidence of cortically based acute infarction. Stable gray-white differentiation, largely normal for age. Vascular: Calcified atherosclerosis at the skull base. No suspicious intracranial vascular hyperdensity. Skull: No acute osseous abnormality identified. Sinuses/Orbits: Visualized paranasal sinuses and mastoids are clear. Other: No orbit or scalp soft tissue injury identified. IMPRESSION: No acute traumatic injury identified. Stable and negative for age noncontrast CT appearance of the brain. Electronically Signed   By: Odessa Fleming M.D.   On: 07/21/2022 11:49    Procedures Procedures    Medications Ordered in ED Medications  acetaminophen (TYLENOL) tablet 650 mg (650 mg Oral Given 07/21/22 1304)    ED Course/ Medical Decision Making/  A&P Clinical Course as of 07/21/22 1352  Fri Jul 21, 2022  1259 Posttraumatic changes noted left shoulder.  No acute fracture seen [JK]  1300 No acute fracture noted in the thoracic spine or cervical spine [JK]  1300 Head CT without acute findings [JK]  1328 X-rays do not show any definite rib fractures. [JK]  1351  CBC and metabolic panel without acute abnormalities [JK]    Clinical Course User Index [JK] Linwood Dibbles, MD                             Medical Decision Making Problems Addressed: Fall, initial encounter: acute illness or injury that poses a threat to life or bodily functions Rib contusion, left, initial encounter: acute illness or injury that poses a threat to life or bodily functions  Amount and/or Complexity of Data Reviewed Labs: ordered. Decision-making details documented in ED Course. Radiology: ordered and independent interpretation performed.  Risk OTC drugs.     patient presented to the ED for evaluation after a fall.  Patient lost her balance when using her walker.  She did not have any weakness or dizziness preceding the event.  No evidence of serious injury on her x-rays or scans.   Patient is hemodynamically stable.  No acute neurologic deficits.  Appears stable for discharge and outpatient management        Final Clinical Impression(s) / ED Diagnoses Final diagnoses:  Fall, initial encounter  Rib contusion, left, initial encounter    Rx / DC Orders ED Discharge Orders     None         Linwood Dibbles, MD 07/21/22 1352

## 2022-07-21 NOTE — ED Notes (Signed)
Called [facility] and arranged for transportation

## 2022-07-21 NOTE — ED Triage Notes (Signed)
Pt arrived via EMS for a fall. She is coming from [facility] facility. Patient states she bent to pick something up when she fell backwards and hit her head and she is currently complaining of left side rib and arm pain and some soreness in the back of her head. She has a brace on from when she fell in February.

## 2022-07-21 NOTE — ED Notes (Signed)
Pts briefs were changed w/ privacy curtains

## 2022-07-21 NOTE — Discharge Instructions (Signed)
the x-rays did not show any signs of dislocation fractures.  You still may still feel stiff and sore from her fall.  Take over-the-counter medications as needed for pain.

## 2023-01-30 ENCOUNTER — Emergency Department (HOSPITAL_COMMUNITY): Payer: Medicare Other

## 2023-01-30 ENCOUNTER — Other Ambulatory Visit: Payer: Self-pay

## 2023-01-30 ENCOUNTER — Inpatient Hospital Stay (HOSPITAL_COMMUNITY): Payer: Medicare Other | Admitting: Certified Registered"

## 2023-01-30 ENCOUNTER — Inpatient Hospital Stay (HOSPITAL_COMMUNITY): Payer: Medicare Other

## 2023-01-30 ENCOUNTER — Encounter (HOSPITAL_COMMUNITY): Payer: Self-pay

## 2023-01-30 ENCOUNTER — Encounter (HOSPITAL_COMMUNITY)
Admission: EM | Disposition: A | Discharge status: Skilled Nursing Facility | Payer: Self-pay | Source: Home / Self Care | Attending: Internal Medicine

## 2023-01-30 ENCOUNTER — Inpatient Hospital Stay (HOSPITAL_COMMUNITY)
Admission: EM | Admit: 2023-01-30 | Discharge: 2023-02-03 | DRG: 493 | Disposition: A | Discharge status: Skilled Nursing Facility | Payer: Medicare Other | Attending: Internal Medicine | Admitting: Internal Medicine

## 2023-01-30 DIAGNOSIS — Z1152 Encounter for screening for COVID-19: Secondary | ICD-10-CM | POA: Diagnosis not present

## 2023-01-30 DIAGNOSIS — Z888 Allergy status to other drugs, medicaments and biological substances status: Secondary | ICD-10-CM

## 2023-01-30 DIAGNOSIS — R739 Hyperglycemia, unspecified: Secondary | ICD-10-CM

## 2023-01-30 DIAGNOSIS — R296 Repeated falls: Secondary | ICD-10-CM | POA: Diagnosis present

## 2023-01-30 DIAGNOSIS — F0394 Unspecified dementia, unspecified severity, with anxiety: Secondary | ICD-10-CM | POA: Diagnosis present

## 2023-01-30 DIAGNOSIS — B9729 Other coronavirus as the cause of diseases classified elsewhere: Secondary | ICD-10-CM | POA: Diagnosis present

## 2023-01-30 DIAGNOSIS — E890 Postprocedural hypothyroidism: Secondary | ICD-10-CM | POA: Diagnosis present

## 2023-01-30 DIAGNOSIS — M5416 Radiculopathy, lumbar region: Secondary | ICD-10-CM | POA: Diagnosis present

## 2023-01-30 DIAGNOSIS — R001 Bradycardia, unspecified: Secondary | ICD-10-CM | POA: Diagnosis present

## 2023-01-30 DIAGNOSIS — Z91018 Allergy to other foods: Secondary | ICD-10-CM

## 2023-01-30 DIAGNOSIS — S82851A Displaced trimalleolar fracture of right lower leg, initial encounter for closed fracture: Principal | ICD-10-CM | POA: Diagnosis present

## 2023-01-30 DIAGNOSIS — Z886 Allergy status to analgesic agent status: Secondary | ICD-10-CM

## 2023-01-30 DIAGNOSIS — K219 Gastro-esophageal reflux disease without esophagitis: Secondary | ICD-10-CM | POA: Diagnosis present

## 2023-01-30 DIAGNOSIS — R0902 Hypoxemia: Secondary | ICD-10-CM | POA: Diagnosis present

## 2023-01-30 DIAGNOSIS — Z79899 Other long term (current) drug therapy: Secondary | ICD-10-CM

## 2023-01-30 DIAGNOSIS — J329 Chronic sinusitis, unspecified: Secondary | ICD-10-CM | POA: Diagnosis present

## 2023-01-30 DIAGNOSIS — Z885 Allergy status to narcotic agent status: Secondary | ICD-10-CM

## 2023-01-30 DIAGNOSIS — S93431A Sprain of tibiofibular ligament of right ankle, initial encounter: Secondary | ICD-10-CM | POA: Diagnosis present

## 2023-01-30 DIAGNOSIS — S8291XA Unspecified fracture of right lower leg, initial encounter for closed fracture: Secondary | ICD-10-CM | POA: Diagnosis not present

## 2023-01-30 DIAGNOSIS — F0393 Unspecified dementia, unspecified severity, with mood disturbance: Secondary | ICD-10-CM | POA: Diagnosis present

## 2023-01-30 DIAGNOSIS — S82891A Other fracture of right lower leg, initial encounter for closed fracture: Secondary | ICD-10-CM | POA: Diagnosis not present

## 2023-01-30 DIAGNOSIS — Z66 Do not resuscitate: Secondary | ICD-10-CM | POA: Diagnosis present

## 2023-01-30 DIAGNOSIS — D62 Acute posthemorrhagic anemia: Secondary | ICD-10-CM | POA: Diagnosis not present

## 2023-01-30 DIAGNOSIS — Z79890 Hormone replacement therapy (postmenopausal): Secondary | ICD-10-CM

## 2023-01-30 DIAGNOSIS — K580 Irritable bowel syndrome with diarrhea: Secondary | ICD-10-CM | POA: Diagnosis present

## 2023-01-30 DIAGNOSIS — F0390 Unspecified dementia without behavioral disturbance: Secondary | ICD-10-CM | POA: Diagnosis present

## 2023-01-30 DIAGNOSIS — M8580 Other specified disorders of bone density and structure, unspecified site: Secondary | ICD-10-CM | POA: Diagnosis present

## 2023-01-30 DIAGNOSIS — M797 Fibromyalgia: Secondary | ICD-10-CM | POA: Diagnosis present

## 2023-01-30 DIAGNOSIS — S32019A Unspecified fracture of first lumbar vertebra, initial encounter for closed fracture: Secondary | ICD-10-CM | POA: Diagnosis present

## 2023-01-30 DIAGNOSIS — S82899A Other fracture of unspecified lower leg, initial encounter for closed fracture: Secondary | ICD-10-CM | POA: Diagnosis present

## 2023-01-30 DIAGNOSIS — E039 Hypothyroidism, unspecified: Secondary | ICD-10-CM | POA: Diagnosis present

## 2023-01-30 DIAGNOSIS — Z8249 Family history of ischemic heart disease and other diseases of the circulatory system: Secondary | ICD-10-CM

## 2023-01-30 DIAGNOSIS — I10 Essential (primary) hypertension: Secondary | ICD-10-CM | POA: Diagnosis present

## 2023-01-30 DIAGNOSIS — M25561 Pain in right knee: Secondary | ICD-10-CM

## 2023-01-30 DIAGNOSIS — F419 Anxiety disorder, unspecified: Secondary | ICD-10-CM | POA: Diagnosis present

## 2023-01-30 DIAGNOSIS — W19XXXA Unspecified fall, initial encounter: Principal | ICD-10-CM | POA: Diagnosis present

## 2023-01-30 HISTORY — PX: ORIF ANKLE FRACTURE: SHX5408

## 2023-01-30 LAB — CBC WITH DIFFERENTIAL/PLATELET
Abs Immature Granulocytes: 0.05 10*3/uL (ref 0.00–0.07)
Abs Immature Granulocytes: 0.05 10*3/uL (ref 0.00–0.07)
Basophils Absolute: 0 10*3/uL (ref 0.0–0.1)
Basophils Absolute: 0 10*3/uL (ref 0.0–0.1)
Basophils Relative: 0 %
Basophils Relative: 0 %
Eosinophils Absolute: 0 10*3/uL (ref 0.0–0.5)
Eosinophils Absolute: 0 10*3/uL (ref 0.0–0.5)
Eosinophils Relative: 0 %
Eosinophils Relative: 0 %
HCT: 45.4 % (ref 36.0–46.0)
HCT: 40.1 % (ref 36.0–46.0)
Hemoglobin: 15.3 g/dL — ABNORMAL HIGH (ref 12.0–15.0)
Hemoglobin: 14 g/dL (ref 12.0–15.0)
Immature Granulocytes: 0 %
Immature Granulocytes: 0 %
Lymphocytes Relative: 3 %
Lymphocytes Relative: 2 %
Lymphs Abs: 0.4 10*3/uL — ABNORMAL LOW (ref 0.7–4.0)
Lymphs Abs: 0.2 10*3/uL — ABNORMAL LOW (ref 0.7–4.0)
MCH: 30.3 pg (ref 26.0–34.0)
MCH: 30.8 pg (ref 26.0–34.0)
MCHC: 33.7 g/dL (ref 30.0–36.0)
MCHC: 34.9 g/dL (ref 30.0–36.0)
MCV: 89.9 fL (ref 80.0–100.0)
MCV: 88.3 fL (ref 80.0–100.0)
Monocytes Absolute: 0.6 10*3/uL (ref 0.1–1.0)
Monocytes Absolute: 0.3 10*3/uL (ref 0.1–1.0)
Monocytes Relative: 4 %
Monocytes Relative: 3 %
Neutro Abs: 12.5 10*3/uL — ABNORMAL HIGH (ref 1.7–7.7)
Neutro Abs: 10.7 10*3/uL — ABNORMAL HIGH (ref 1.7–7.7)
Neutrophils Relative %: 93 %
Neutrophils Relative %: 95 %
Platelets: 174 10*3/uL (ref 150–400)
Platelets: 181 10*3/uL (ref 150–400)
RBC: 5.05 MIL/uL (ref 3.87–5.11)
RBC: 4.54 MIL/uL (ref 3.87–5.11)
RDW: 12.8 % (ref 11.5–15.5)
RDW: 12.9 % (ref 11.5–15.5)
WBC: 13.5 10*3/uL — ABNORMAL HIGH (ref 4.0–10.5)
WBC: 11.3 10*3/uL — ABNORMAL HIGH (ref 4.0–10.5)
nRBC: 0 % (ref 0.0–0.2)
nRBC: 0 % (ref 0.0–0.2)

## 2023-01-30 LAB — BASIC METABOLIC PANEL
Anion gap: 10 (ref 5–15)
BUN: 15 mg/dL (ref 8–23)
CO2: 26 mmol/L (ref 22–32)
Calcium: 9.4 mg/dL (ref 8.9–10.3)
Chloride: 103 mmol/L (ref 98–111)
Creatinine, Ser: 0.97 mg/dL (ref 0.44–1.00)
GFR, Estimated: 57 mL/min — ABNORMAL LOW (ref >60)
Glucose, Bld: 142 mg/dL — ABNORMAL HIGH (ref 70–99)
Potassium: 4.6 mmol/L (ref 3.5–5.1)
Sodium: 139 mmol/L (ref 135–145)

## 2023-01-30 LAB — PROTIME-INR
INR: 1 (ref 0.8–1.2)
Prothrombin Time: 13.2 s (ref 11.4–15.2)

## 2023-01-30 LAB — TYPE AND SCREEN
ABO/RH(D): A NEG
Antibody Screen: NEGATIVE

## 2023-01-30 LAB — RESPIRATORY PANEL BY PCR

## 2023-01-30 LAB — RESP PANEL BY RT-PCR (RSV, FLU A&B, COVID)  RVPGX2
Influenza A by PCR: NEGATIVE
Influenza B by PCR: NEGATIVE
Resp Syncytial Virus by PCR: NEGATIVE
SARS Coronavirus 2 by RT PCR: NEGATIVE

## 2023-01-30 LAB — I-STAT CG4 LACTIC ACID, ED: Lactic Acid, Venous: 1.9 mmol/L (ref 0.5–1.9)

## 2023-01-30 SURGERY — OPEN REDUCTION INTERNAL FIXATION (ORIF) ANKLE FRACTURE
Anesthesia: Regional | Site: Ankle | Laterality: Right

## 2023-01-30 MED ORDER — TRAZODONE HCL 100 MG PO TABS
150.0000 mg | ORAL_TABLET | Freq: Every day | ORAL | Status: DC
Start: 2023-01-30 — End: 2023-02-03
  Administered 2023-01-30 – 2023-02-02 (×4): 150 mg via ORAL
  Filled 2023-01-30 (×4): qty 2

## 2023-01-30 MED ORDER — DICYCLOMINE HCL 20 MG PO TABS
20.0000 mg | ORAL_TABLET | Freq: Three times a day (TID) | ORAL | Status: DC
  Administered 2023-01-30 – 2023-02-03 (×11): 20 mg via ORAL
  Filled 2023-01-30 (×11): qty 1

## 2023-01-30 MED ORDER — MORPHINE SULFATE (PF) 2 MG/ML IV SOLN
2.0000 mg | INTRAVENOUS | Status: DC | PRN
  Administered 2023-02-02 – 2023-02-03 (×3): 2 mg via INTRAVENOUS
  Filled 2023-01-30 (×3): qty 1

## 2023-01-30 MED ORDER — FENTANYL CITRATE PF 50 MCG/ML IJ SOSY
50.0000 ug | PREFILLED_SYRINGE | Freq: Once | INTRAMUSCULAR | Status: AC
  Administered 2023-01-30: 50 ug via INTRAVENOUS
  Filled 2023-01-30: qty 1

## 2023-01-30 MED ORDER — OXYCODONE HCL 5 MG PO TABS
5.0000 mg | ORAL_TABLET | ORAL | Status: DC | PRN
  Administered 2023-01-30 – 2023-01-31 (×4): 5 mg via ORAL
  Filled 2023-01-30 (×4): qty 1

## 2023-01-30 MED ORDER — CEFAZOLIN SODIUM-DEXTROSE 2-4 GM/100ML-% IV SOLN
2.0000 g | Freq: Three times a day (TID) | INTRAVENOUS | Status: AC
  Administered 2023-01-30 – 2023-01-31 (×2): 2 g via INTRAVENOUS
  Filled 2023-01-30 (×2): qty 100

## 2023-01-30 MED ORDER — SENNOSIDES-DOCUSATE SODIUM 8.6-50 MG PO TABS: 1.0000 | ORAL_TABLET | Freq: Every evening | ORAL | Status: DC | PRN

## 2023-01-30 MED ORDER — ACETAMINOPHEN 10 MG/ML IV SOLN
INTRAVENOUS | Status: AC
  Filled 2023-01-30: qty 100

## 2023-01-30 MED ORDER — CEFAZOLIN SODIUM-DEXTROSE 2-4 GM/100ML-% IV SOLN
2.0000 g | Freq: Once | INTRAVENOUS | Status: AC
  Administered 2023-01-30: 2 g via INTRAVENOUS
  Filled 2023-01-30: qty 100

## 2023-01-30 MED ORDER — DEXAMETHASONE SODIUM PHOSPHATE 10 MG/ML IJ SOLN
INTRAMUSCULAR | Status: DC | PRN
  Administered 2023-01-30: 10 mg

## 2023-01-30 MED ORDER — METOPROLOL TARTRATE 25 MG PO TABS
12.5000 mg | ORAL_TABLET | Freq: Two times a day (BID) | ORAL | Status: DC
  Administered 2023-01-31: 12.5 mg via ORAL
  Filled 2023-01-30 (×2): qty 1

## 2023-01-30 MED ORDER — ONDANSETRON HCL 4 MG/2ML IJ SOLN
INTRAMUSCULAR | Status: DC | PRN
  Administered 2023-01-30: 4 mg via INTRAVENOUS

## 2023-01-30 MED ORDER — PROPOFOL 500 MG/50ML IV EMUL
INTRAVENOUS | Status: DC | PRN
  Administered 2023-01-30: 100 ug/kg/min via INTRAVENOUS
  Administered 2023-01-30: 55 ug/kg/min via INTRAVENOUS

## 2023-01-30 MED ORDER — FAMOTIDINE 20 MG PO TABS
20.0000 mg | ORAL_TABLET | Freq: Two times a day (BID) | ORAL | Status: DC
  Administered 2023-01-30 – 2023-02-03 (×8): 20 mg via ORAL
  Filled 2023-01-30 (×8): qty 1

## 2023-01-30 MED ORDER — LEVOTHYROXINE SODIUM 100 MCG PO TABS
100.0000 ug | ORAL_TABLET | Freq: Every day | ORAL | Status: DC
  Administered 2023-01-31 – 2023-02-03 (×4): 100 ug via ORAL
  Filled 2023-01-30 (×4): qty 1

## 2023-01-30 MED ORDER — ACETAMINOPHEN 10 MG/ML IV SOLN
1000.0000 mg | Freq: Once | INTRAVENOUS | Status: DC | PRN
  Administered 2023-01-30: 1000 mg via INTRAVENOUS

## 2023-01-30 MED ORDER — FENTANYL CITRATE PF 50 MCG/ML IJ SOSY
PREFILLED_SYRINGE | INTRAMUSCULAR | Status: AC
  Filled 2023-01-30: qty 1

## 2023-01-30 MED ORDER — FLUTICASONE PROPIONATE 50 MCG/ACT NA SUSP
2.0000 | Freq: Every day | NASAL | Status: DC
  Administered 2023-01-31 – 2023-02-03 (×4): 2 via NASAL
  Filled 2023-01-30: qty 16

## 2023-01-30 MED ORDER — ONDANSETRON HCL 4 MG/2ML IJ SOLN
INTRAMUSCULAR | Status: AC
Start: 2023-01-30 — End: ?
  Filled 2023-01-30: qty 2

## 2023-01-30 MED ORDER — LIDOCAINE HCL (CARDIAC) PF 100 MG/5ML IV SOSY
PREFILLED_SYRINGE | INTRAVENOUS | Status: DC | PRN
  Administered 2023-01-30: 40 mg via INTRAVENOUS

## 2023-01-30 MED ORDER — HYDRALAZINE HCL 20 MG/ML IJ SOLN
5.0000 mg | INTRAMUSCULAR | Status: DC | PRN
  Administered 2023-02-03: 5 mg via INTRAVENOUS
  Filled 2023-01-30: qty 1

## 2023-01-30 MED ORDER — DONEPEZIL HCL 5 MG PO TABS
10.0000 mg | ORAL_TABLET | Freq: Every day | ORAL | Status: DC
  Administered 2023-01-30: 10 mg via ORAL
  Filled 2023-01-30 (×2): qty 2

## 2023-01-30 MED ORDER — PANTOPRAZOLE SODIUM 40 MG PO TBEC
40.0000 mg | DELAYED_RELEASE_TABLET | Freq: Every day | ORAL | Status: DC
  Administered 2023-01-30 – 2023-02-03 (×5): 40 mg via ORAL
  Filled 2023-01-30 (×5): qty 1

## 2023-01-30 MED ORDER — BUSPIRONE HCL 10 MG PO TABS
30.0000 mg | ORAL_TABLET | Freq: Two times a day (BID) | ORAL | Status: DC
  Administered 2023-01-30 – 2023-02-03 (×8): 30 mg via ORAL
  Filled 2023-01-30 (×8): qty 3

## 2023-01-30 MED ORDER — PROPOFOL 10 MG/ML IV BOLUS
INTRAVENOUS | Status: AC
Start: 2023-01-30 — End: ?
  Filled 2023-01-30: qty 20

## 2023-01-30 MED ORDER — FENTANYL CITRATE PF 50 MCG/ML IJ SOSY
12.5000 ug | PREFILLED_SYRINGE | Freq: Once | INTRAMUSCULAR | Status: AC
Start: 2023-01-30 — End: 2023-01-30
  Administered 2023-01-30: 12.5 ug via INTRAVENOUS
  Filled 2023-01-30: qty 1

## 2023-01-30 MED ORDER — LORAZEPAM 0.5 MG PO TABS
0.5000 mg | ORAL_TABLET | Freq: Every evening | ORAL | Status: DC
Start: 2023-01-30 — End: 2023-02-03
  Administered 2023-01-31 – 2023-02-02 (×3): 0.5 mg via ORAL
  Filled 2023-01-30 (×3): qty 1

## 2023-01-30 MED ORDER — FENTANYL CITRATE PF 50 MCG/ML IJ SOSY
PREFILLED_SYRINGE | INTRAMUSCULAR | Status: AC
  Filled 2023-01-30: qty 2

## 2023-01-30 MED ORDER — FENTANYL CITRATE PF 50 MCG/ML IJ SOSY
25.0000 ug | PREFILLED_SYRINGE | INTRAMUSCULAR | Status: DC | PRN
  Administered 2023-01-30: 50 ug via INTRAVENOUS

## 2023-01-30 MED ORDER — PROPOFOL 10 MG/ML IV BOLUS
INTRAVENOUS | Status: DC | PRN
  Administered 2023-01-30: 50 mg via INTRAVENOUS
  Administered 2023-01-30: 30 mg via INTRAVENOUS
  Administered 2023-01-30: 10 mg via INTRAVENOUS
  Administered 2023-01-30: 30 mg via INTRAVENOUS
  Administered 2023-01-30: 60 mg via INTRAVENOUS
  Administered 2023-01-30: 30 mg via INTRAVENOUS
  Administered 2023-01-30: 10 mg via INTRAVENOUS

## 2023-01-30 MED ORDER — LOPERAMIDE HCL 2 MG PO CAPS
4.0000 mg | ORAL_CAPSULE | ORAL | Status: DC | PRN
  Administered 2023-01-31 – 2023-02-01 (×2): 4 mg via ORAL
  Filled 2023-01-30 (×2): qty 2

## 2023-01-30 MED ORDER — TRANEXAMIC ACID-NACL 1000-0.7 MG/100ML-% IV SOLN: 1000.0000 mg | INTRAVENOUS | Status: AC

## 2023-01-30 MED ORDER — LACTATED RINGERS IV SOLN: INTRAVENOUS | Status: DC | PRN

## 2023-01-30 MED ORDER — PROPOFOL 10 MG/ML IV BOLUS
28.0000 mg | Freq: Once | INTRAVENOUS | Status: AC
  Administered 2023-01-30: 25 mg via INTRAVENOUS

## 2023-01-30 MED ORDER — DEXAMETHASONE SODIUM PHOSPHATE 10 MG/ML IJ SOLN
INTRAMUSCULAR | Status: AC
Start: 2023-01-30 — End: ?
  Filled 2023-01-30: qty 1

## 2023-01-30 MED ORDER — FENTANYL CITRATE PF 50 MCG/ML IJ SOSY
50.0000 ug | PREFILLED_SYRINGE | Freq: Once | INTRAMUSCULAR | Status: AC
  Administered 2023-01-30: 50 ug via INTRAVENOUS

## 2023-01-30 MED ORDER — HEPARIN SODIUM (PORCINE) 5000 UNIT/ML IJ SOLN
5000.0000 [IU] | Freq: Three times a day (TID) | INTRAMUSCULAR | Status: DC
  Administered 2023-01-30 – 2023-02-03 (×12): 5000 [IU] via SUBCUTANEOUS
  Filled 2023-01-30 (×12): qty 1

## 2023-01-30 MED ORDER — ROPIVACAINE HCL 5 MG/ML IJ SOLN
INTRAMUSCULAR | Status: DC | PRN
  Administered 2023-01-30: 30 mL via PERINEURAL

## 2023-01-30 MED ORDER — SERTRALINE HCL 50 MG PO TABS
200.0000 mg | ORAL_TABLET | Freq: Every morning | ORAL | Status: DC
  Administered 2023-01-31 – 2023-02-03 (×4): 200 mg via ORAL
  Filled 2023-01-30 (×4): qty 4

## 2023-01-30 MED ORDER — LACTATED RINGERS IV BOLUS: 500.0000 mL | Freq: Once | INTRAVENOUS | Status: DC

## 2023-01-30 MED ORDER — DEXAMETHASONE SODIUM PHOSPHATE 4 MG/ML IJ SOLN
INTRAMUSCULAR | Status: DC | PRN
  Administered 2023-01-30: 4 mg via INTRAVENOUS

## 2023-01-30 MED ORDER — SODIUM CHLORIDE 0.9 % IV BOLUS
500.0000 mL | Freq: Once | INTRAVENOUS | Status: AC
  Administered 2023-01-30: 500 mL via INTRAVENOUS

## 2023-01-30 MED ORDER — ONDANSETRON HCL 4 MG/2ML IJ SOLN
4.0000 mg | Freq: Once | INTRAMUSCULAR | Status: AC
  Administered 2023-01-30: 4 mg via INTRAVENOUS
  Filled 2023-01-30: qty 2

## 2023-01-30 SURGICAL SUPPLY — 56 items
BAG COUNTER SPONGE SURGICOUNT (BAG) IMPLANT
BAG ZIPLOCK 12X15 (MISCELLANEOUS) ×2 IMPLANT
BANDAGE ESMARK 6X9 LF (GAUZE/BANDAGES/DRESSINGS) IMPLANT
BIT DRILL 2 CANN GRADUATED (BIT) IMPLANT
BIT DRILL 2.5 CANN LNG (BIT) IMPLANT
BIT DRILL 2.5 CANN STRL (BIT) IMPLANT
BIT DRILL 2.6 CANN (BIT) IMPLANT
BNDG COHESIVE 4X5 TAN STRL LF (GAUZE/BANDAGES/DRESSINGS) ×2 IMPLANT
BNDG ELASTIC 4INX 5YD STR LF (GAUZE/BANDAGES/DRESSINGS) ×2 IMPLANT
BNDG ELASTIC 6INX 5YD STR LF (GAUZE/BANDAGES/DRESSINGS) ×2 IMPLANT
BNDG ESMARK 6X9 LF (GAUZE/BANDAGES/DRESSINGS) ×1
CHIP CORTICOCANCELLOUS 30CC (Bone Implant) ×1 IMPLANT
CHIPS CORTICOCANCELLOUS 30CC (Bone Implant) ×1 IMPLANT
CHLORAPREP W/TINT 26 (MISCELLANEOUS) IMPLANT
COVER SURGICAL LIGHT HANDLE (MISCELLANEOUS) ×2 IMPLANT
CUFF TRNQT CYL 24X4X16.5-23 (TOURNIQUET CUFF) IMPLANT
CUFF TRNQT CYL 34X4.125X (TOURNIQUET CUFF) ×2 IMPLANT
DRAPE C-ARM 42X120 X-RAY (DRAPES) ×2 IMPLANT
DRAPE C-ARMOR (DRAPES) ×2 IMPLANT
DRAPE U-SHAPE 47X51 STRL (DRAPES) ×2 IMPLANT
DRSG ADAPTIC 3X8 NADH LF (GAUZE/BANDAGES/DRESSINGS) ×2 IMPLANT
DURAPREP 26ML APPLICATOR (WOUND CARE) ×2 IMPLANT
ELECT REM PT RETURN 15FT ADLT (MISCELLANEOUS) ×2 IMPLANT
GAUZE PAD ABD 8X10 STRL (GAUZE/BANDAGES/DRESSINGS) ×4 IMPLANT
GAUZE SPONGE 4X4 12PLY STRL (GAUZE/BANDAGES/DRESSINGS) ×4 IMPLANT
GLOVE BIO SURGEON STRL SZ7.5 (GLOVE) ×4 IMPLANT
GLOVE BIOGEL PI IND STRL 8 (GLOVE) ×4 IMPLANT
GRAFT BNE CORT CANC CHIPS 30CC (Bone Implant) IMPLANT
GUIDEWIRE 1.35MM (WIRE) IMPLANT
K-WIRE BB-TAK (WIRE) ×1
KIT BASIN OR (CUSTOM PROCEDURE TRAY) ×2 IMPLANT
KIT TURNOVER KIT A (KITS) IMPLANT
KWIRE BB-TAK (WIRE) IMPLANT
MANIFOLD NEPTUNE II (INSTRUMENTS) ×2 IMPLANT
NS IRRIG 1000ML POUR BTL (IV SOLUTION) ×2 IMPLANT
PACK ORTHO EXTREMITY (CUSTOM PROCEDURE TRAY) ×2 IMPLANT
PAD CAST 4YDX4 CTTN HI CHSV (CAST SUPPLIES) ×4 IMPLANT
PENCIL SMOKE EVACUATOR (MISCELLANEOUS) IMPLANT
PLATE LOCK DIST FIB 5H RT (Plate) IMPLANT
PROTECTOR NERVE ULNAR (MISCELLANEOUS) ×2 IMPLANT
SCREW CANN T15 ST 50X4 ST (Screw) IMPLANT
SCREW LOCKING 2.7X10 ANKLE (Screw) IMPLANT
SCREW LOCKING 2.7X12 ANKLE (Screw) IMPLANT
SCREW LOCKING 2.7X14MM (Screw) IMPLANT
SCREW LOCKING 3.5X12MM (Screw) IMPLANT
SCREW NLOCK CORT 3.5X50 NS (Screw) IMPLANT
SCREW NON-LOCKING 3.5X12MM (Screw) IMPLANT
SPLINT PLASTER CAST XFAST 5X30 (CAST SUPPLIES) IMPLANT
SUT ETHILON 3 0 PS 1 (SUTURE) ×6 IMPLANT
SUT MNCRL AB 4-0 PS2 18 (SUTURE) IMPLANT
SUT MON AB 3-0 SH27 (SUTURE) IMPLANT
SUT VIC AB 0 CT1 27XBRD ANTBC (SUTURE) ×2 IMPLANT
SUT VIC AB 1 CT1 36 (SUTURE) ×2 IMPLANT
SUT VIC AB 2-0 CT1 TAPERPNT 27 (SUTURE) ×2 IMPLANT
SUT VICRYL+ 3-0 36IN CT-1 (SUTURE) IMPLANT
TOWEL OR 17X26 10 PK STRL BLUE (TOWEL DISPOSABLE) ×2 IMPLANT

## 2023-01-30 NOTE — Progress Notes (Signed)
Called and updated son on mother's surgery.  Aware and also updated by surgeon.

## 2023-01-30 NOTE — Discharge Instructions (Addendum)
Orthopaedic Surgery Discharge Instructions  Surgery: Right ankle open reduction internal fixation  Weight bearing: Nonweightbearing  Keep your splint clean and dry at all times.  Elevate your leg to help reduce pain and swelling.  Follow up appointment: You are scheduled to follow up with Dr. Thad Ranger. If you do not know when your follow up appointment is, please call [phone] to schedule your appointment. Our office is located at [address] [place], [place], [address].  To reach our office with any questions or concerns please call [phone].

## 2023-01-30 NOTE — ED Notes (Signed)
Pt was placed on 4L O2 [place] after destatting to 85%

## 2023-01-30 NOTE — ED Notes (Signed)
Patient took off [place], O2 staying within normal limits.

## 2023-01-30 NOTE — Anesthesia Procedure Notes (Addendum)
Procedure Name: LMA Insertion Date/Time: 01/30/2023 3:28 PM  Performed by: Maurene Capes, CRNAPre-anesthesia Checklist: Patient identified, Emergency Drugs available, Suction available and Patient being monitored Patient Re-evaluated:Patient Re-evaluated prior to induction Oxygen Delivery Method: Circle System Utilized Preoxygenation: Pre-oxygenation with 100% oxygen Induction Type: IV induction Ventilation: Mask ventilation without difficulty LMA: LMA inserted LMA Size: 3.0 Number of attempts: 1 Airway Equipment and Method: Bite block Placement Confirmation: positive ETCO2 Tube secured with: Tape Dental Injury: Teeth and Oropharynx as per pre-operative assessment

## 2023-01-30 NOTE — Op Note (Signed)
Operative Note  Erika Conley  Surgery Date: 01/30/2023 Surgeon: Rosalee Kaufman, MD Assistant(s): None  Preop Diagnosis(es):  Right ankle trimalleolar fracture dislocation  Postop Diagnosis(es):  Right ankle trimalleolar fracture dislocation  Operative Procedure(s): Open treatment of right trimalleolar ankle fracture without fixation of the posterior malleolus (CPT [phone]) Manual application of stress performed by physician for radiographic evaluation of right ankle joint (CPT [phone]) Open repair of syndesmosis right ankle (CPT [phone])  Anesthesia: The patient had administration of general anesthesia. Further details can be found in the anesthesia record.  Estimated Blood Loss: 50 mL  Complications:  None noted intraoperatively  Drains: None  Tourniquet Time: 59 minutes at  Implants:  Arthrex distal fibula plate and 2 medial malleolar screws, one 3.5 syndesmotic screw. Full detailed list below.  Indications:  Erika Conley is a 88 y.o. year old female who presented to the emergency department earlier today with a trimalleolar ankle fracture dislocation.  She was unable to be successfully reduced in the emergency department therefore we discussed reduction in the operating room with internal versus external fixation.  I discussed this with both her and her son and they wish to proceed.  We did discuss the risks of the procedure including loss of fixation, nonunion or malunion, hardware failure, need for revision surgery, infection and neurovascular injury..  After thorough discussion of the risks and benefits of surgical management and alternative nonoperative treatment options, they elected to proceed with surgical treatment. Risks and complications were discussed and understood including, but not limited to, bleeding, infection, stiffness, numbness, damage to surrounding structures (including blood vessels and nerves), failure of the procedure, need for secondary or revision  procedures, failure of healing, incomplete functional recovery, and worsening or chronic pain. Additional risks pertinent to the surgery and anesthesia also include pulmonary compromise, blood clots/pulmonary embolism, cardiac complications, and death. No guarantees were stated or implied. All questions were answered to the best of my ability and the patient verbalized understanding.    Description of Procedure:  The patient was identified in the holding area, taken to the operating room and underwent successful induction of anesthesia. They were then placed on the operating table in a supine position, with all bony prominences well padded. Preprocedure antibiotics were administered. A time out was performed and all parties were in agreement with the patient identification, surgical site and planned procedure. The extremity was then prepped and draped in usual sterile fashion. A second time out was performed prior to incision, once again confirming the patient, site, procedure and expectations of surgery.  A lateral approach was made to the fibula.  Dissection was taken down through skin and subcutaneous tissue, anterior to the peroneals using a 15 blade scalpel.  The fibula fracture was identified and cleaned of intervening hematoma with a scalpel.  Notably there is a small amount of cortical bone loss and no clear cortical read.  We decided to proceed with bridge plating of the fracture. It was placed along the posterior fibula while holding the reduction with provisional clamps. Once adequate position was confirmed, a non-locking screw was placed just proximal to the apex of the fracture after bicortical drilling.  We confirmed both reduction of the fibula and placement of the plate using biplanar fluoroscopy.  We then placed distal locking screws and due to her osteopenia also proximal locking screws to complete our fixation.  We verified screw lengths and reduction on fluoroscopy.  MEDIAL  MALLEOLUS: Attention was then turned to the medial malleolus.  A standard anterior medial approach to the medial malleolus was performed with sharp dissection through skin and subcutaneous tissue.  Care was taken to preserve and protect the saphenous nerve and vein throughout the case.  The medial malleolus fracture was identified and cleaned and debrided of all intervening soft tissue and hematoma.  The guidewire for the 4.0-mm cannulated screws were then inserted, one in the anterior colliculus and one in the anterior colliculus groove.  Their position was confirmed on both mortise and lateral x-rays.  The cannulated drill was then used through the cortex and a 4.0 mm partially threaded cannulated screws were inserted over the guidewires into the appropriate position to achieve interfragmentary compression in the lag by design fashion.  AP, mortise, and lateral radiographs confirmed reduction of fracture and position of the implants.  The wires were then removed.    On manual stress view there was noted to be medial clear space widening indicating a syndesmosis injury. A manual thumb reduction of the syndesmosis was performed. Next, one 3.5 mm cortical screws were placed through the lateral plate in a quadricortical fashion.    Final radiographs were taken including both AP, mortise and lateral views to confirm reduction of the fractures, ankle mortise and safe hardware placement.  The wound was copiously irrigated and closed in layered fashion with 2-0 Monocryl and 3-0 Monocryl running sutures. A sterile dressing was applied.   A short leg splint was applied. The patient was awakened and taken to PACU in stable condition.  All sponge, needle and instrument counts were correct at the end of the case.  Post-Operative Condition: The patient was transferred to the PACU in stable condition.  Post-Operative Plan: They will be nonweightbearing at all times with a splint.  They will follow our ankle fracture  rehabilitation protocol.  We will see them back in 10 days for a wound check and for further review of surgical findings.  Implant Record:  Implant Name Type Inv. Item Serial No. Manufacturer Lot No. LRB No. Used Action  SCREW LOCKING 3.5X12MM - [id number] Screw SCREW LOCKING 3.5X12MM  [employer]  Right 1 Implanted  SCREW LOCKING 2.7X14MM - [id number] Screw SCREW LOCKING 2.7X14MM  [employer]  Right 2 Implanted  SCREW LOCKING 2.7X12 ANKLE - [id number] Screw SCREW LOCKING 2.7X12 ANKLE  [employer]  Right 3 Implanted  PLATE LOCK DIST FIB 5H RT - [id number] Plate PLATE LOCK DIST FIB 5H RT  [employer]  Right 1 Implanted  SCREW LOCKING 2.7X10 ANKLE - [id number] Screw SCREW LOCKING 2.7X10 ANKLE  [employer]  Right 1 Implanted  SCREW CANN T15 ST 50X4 ST - [id number] Screw SCREW CANN T15 ST 50X4 ST  [employer]  Right 2 Implanted  SCREW NLOCK CORT 3.5X50 NS - [id number] Screw SCREW NLOCK CORT 3.5X50 NS  [employer]  Right 1 Implanted  CHIP CORTICOCANCELLOUS 30CC - [id number] Bone Implant CHIP CORTICOCANCELLOUS 30CC [id number] [facility]  Right 1 Implanted

## 2023-01-30 NOTE — ED Notes (Signed)
Patient alert directly after procedure. VSS.

## 2023-01-30 NOTE — ED Triage Notes (Addendum)
Pt BIB EMS from [facility] for N/V/D. There has been a "virus/stomach bug" going around at the facility. Pt tried to go to the bathroom and had a mechanical fall. Pt right ankle is deformed and has c/o neck pain.   No blood thinners Hx dementia but A&Ox4  128 palpated 52 HR

## 2023-01-30 NOTE — Anesthesia Preprocedure Evaluation (Addendum)
Anesthesia Evaluation  Patient identified by MRN, date of birth, ID band Patient awake    Reviewed: Allergy & Precautions, NPO status , Patient's Chart, lab work & pertinent test results  Airway Mallampati: II  TM Distance: >3 FB Neck ROM: Full    Dental  (+) Edentulous Upper, Edentulous Lower   Pulmonary neg pulmonary ROS   Pulmonary exam normal        Cardiovascular hypertension, Pt. on medications and Pt. on home beta blockers  Rhythm:Regular Rate:Normal     Neuro/Psych   Anxiety Depression   Dementia    GI/Hepatic Neg liver ROS,GERD  Medicated,,  Endo/Other  Hypothyroidism    Renal/GU negative Renal ROS  negative genitourinary   Musculoskeletal  (+) Arthritis ,  Fibromyalgia -  Abdominal Normal abdominal exam  (+)   Peds  Hematology Lab Results      Component                Value               Date                      WBC                      11.3 (H)            01/30/2023                HGB                      14.0                01/30/2023                HCT                      40.1                01/30/2023                MCV                      88.3                01/30/2023                PLT                      181                 01/30/2023             Lab Results      Component                Value               Date                      NA                       139                 01/30/2023                K  4.6                 01/30/2023                CO2                      26                  01/30/2023                GLUCOSE                  142 (H)             01/30/2023                BUN                      15                  01/30/2023                CREATININE               0.97                01/30/2023                CALCIUM                  9.4                 01/30/2023                GFR                      80.44               01/21/2016                 GFRNONAA                 57 (L)              01/30/2023              Anesthesia Other Findings   Reproductive/Obstetrics                             Anesthesia Physical Anesthesia Plan  ASA: 3  Anesthesia Plan: General and Regional   Post-op Pain Management: Regional block*   Induction: Intravenous  PONV Risk Score and Plan: 3 and Ondansetron, Dexamethasone and Treatment may vary due to age or medical condition  Airway Management Planned: Mask and LMA  Additional Equipment: None  Intra-op Plan:   Post-operative Plan: Extubation in OR  Informed Consent: I have reviewed the patients History and Physical, chart, labs and discussed the procedure including the risks, benefits and alternatives for the proposed anesthesia with the patient or authorized representative who has indicated his/her understanding and acceptance.   Patient has DNR.   Dental advisory given  Plan Discussed with: CRNA  Anesthesia Plan Comments:        Anesthesia Quick Evaluation

## 2023-01-30 NOTE — H&P (Signed)
HPI  Erika Conley GBT:[id number] DOB: September 13, 1934 DOA: 01/30/2023  PCP: Oneita Hurt, No   Chief Complaint: Cough, fall, ankle fracture  HPI:  88 year old white female known ILF resident with syncope orthostasis previously on midodrine--she has previously been referred to neurorehab for vestibular rehab in the past Hypothyroid L4-L5 radiculopathy Depression Underlying IBS Prior admissions for several falls last 03-18-22 with fall and unstable L1 vertebral fracture managed with TLSO  Tells me she has been sick for several weeks with sniffles cold recently received a prescription of cough medicine diagnosed with sinus infection and started on antibiotics and was ambulatory, felt nauseous and slid out of the bed and developed immediate pain on the right side All imaging of lower extremity relatively stable but found to have markedly displaced trimalleolar fracture with complete disruption of ankle mortise CT head CT cervical spine negative for acute fracture CXR showed no acute pulm disease  While she was in the emergency room she had a fever 100.4 became little hypoxic-currently she is off oxygen looks fairly comfortable Tells me that has IBS and so has chronic diarrhea Was not treated with antibiotics at the facility as felt no real severe illness Baseline she is coherent able to voice concerns Has lived at ILF for a while    Review of Systems:  As below  Pertinent +'s: Off, cold, Pertinent -"s: Blurred vision double vision strokelike symptoms dark stools tarry stool seizure  ED Course: Blood culture X2 obtained, NS bolus 500 cc given Fentanyl several rounds given and propofol given and then patient's ankle was reduced UA collected    Past Medical History:  Diagnosis Date   Anxiety    Arthritis    Depression    Falls frequently    Fibromyalgia    Fracture, clavicle    GERD (gastroesophageal reflux disease)    History of fractured vertebra    Hypertension    Hypothyroidism     Seasonal allergies    Thyroid disease    Vertigo    Vitamin A deficiency    Wrist fracture, right    Past Surgical History:  Procedure Laterality Date   ABDOMINAL HYSTERECTOMY     BACK SURGERY     CHOLECYSTECTOMY     ERCP N/A 10/22/2015   Procedure: ENDOSCOPIC RETROGRADE CHOLANGIOPANCREATOGRAPHY (ERCP);  Surgeon: Rachael Fee, MD;  Location: [facility];  Service: Endoscopy;  Laterality: N/A;   ERCP N/A 11/25/2015   Procedure: ENDOSCOPIC RETROGRADE CHOLANGIOPANCREATOGRAPHY (ERCP);  Surgeon: Rachael Fee, MD;  Location: [facility];  Service: Endoscopy;  Laterality: N/A;   EUS N/A 11/25/2015   Procedure: ESOPHAGEAL ENDOSCOPIC ULTRASOUND (EUS) RADIAL;  Surgeon: Rachael Fee, MD;  Location: [facility];  Service: Endoscopy;  Laterality: N/A;   FRACTURE SURGERY     THYROIDECTOMY     WRIST SURGERY      reports that she has never smoked. She has never used smokeless tobacco. She reports that she does not drink alcohol and does not use drugs.  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 Authorizing Provider  acetaminophen (TYLENOL) 325 MG tablet Take 1 tablet (325 mg total) by mouth every 4 (four) hours as needed for headache. 04/18/22   Azucena Fallen, MD  amLODipine (NORVASC) 10 MG tablet Take 1 tablet (10 mg total) by mouth daily. 04/19/22    Azucena Fallen, MD  Biotin 5000 MCG CAPS Take 5,000 mcg by mouth in the morning.    [provider]  busPIRone (BUSPAR) 30 MG tablet Take 30 mg by mouth in the morning and at bedtime.    [provider]  calcium carbonate (TUMS - DOSED IN MG ELEMENTAL CALCIUM) 500 MG chewable tablet Chew 2-3 tablets by mouth 3 (three) times daily with meals as needed (for GERD).    [provider]  calcium-vitamin D (OSCAL WITH D) 500-5 MG-MCG tablet Take 2 tablets by mouth 2 (two) times daily with a meal. 04/18/22   Azucena Fallen, MD  cetirizine (ZYRTEC) 10 MG tablet Take 10 mg by mouth daily.    [provider]  dicyclomine (BENTYL) 20 MG tablet Take 20 mg by mouth 3 (three) times daily.    [provider]  donepezil (ARICEPT) 10 MG tablet Take 10 mg by mouth at bedtime.    [provider]  famotidine (PEPCID) 20 MG tablet Take 20 mg by mouth 2 (two) times daily.    [provider]  fluticasone (FLONASE) 50 MCG/ACT nasal spray Place 2 sprays into both nostrils daily.    [provider]  HYDROcodone-acetaminophen (NORCO/VICODIN) 5-325 MG tablet Take 1-2 tablets by mouth every 4 (four) hours as needed for moderate pain.    [provider]  hydrOXYzine (ATARAX) 10 MG tablet Take 1 tablet (10 mg total) by mouth every 6 (six) hours as needed for itching. Patient not taking: Reported on 07/21/2022 05/20/22   Arthor Captain, PA-C  Infant [employer] [facility]) OINT Apply 1 Application topically 3 (three) times daily. To buttocks    [provider]  ketoconazole (NIZORAL) 2 % cream Apply 1 Application topically daily. Apply to the vulva once a day for 14 days Patient not taking: Reported on 07/21/2022 05/20/22   Arthor Captain, PA-C  levothyroxine (SYNTHROID) 100 MCG tablet Take 100 mcg by mouth daily before breakfast.    [provider]  loperamide (IMODIUM A-D) 2 MG tablet Take 4 mg by mouth See admin  instructions. Take 4 mg by mouth after 1st loose stool movement, and an additional tablet with each loose stool. (Max 8 tablets).    [provider]  LORazepam (ATIVAN) 0.5 MG tablet Take 1 tablet (0.5 mg total) by mouth every evening. 04/18/22   Azucena Fallen, MD  metoprolol tartrate (LOPRESSOR) 25 MG tablet Take 0.5 tablets (12.5 mg total) by mouth 2 (two) times daily. 04/18/22   Azucena Fallen, MD  Multiple Vitamins-Minerals (PRESERVISION AREDS 2) CAPS Take 1 capsule by mouth in the morning and at bedtime.    [provider]  omeprazole (PRILOSEC) 40 MG capsule Take 1 capsule (40 mg total) by mouth 2 (two) times daily. Take shortly before breakfast and dinner meal Patient taking differently: Take 40 mg by mouth 2 (two) times daily. 07/16/18   Rachael Fee, MD  senna-docusate (SENOKOT-S) 8.6-50 MG tablet Take 1 tablet by mouth at bedtime as needed for mild constipation. Patient not taking: Reported on 07/21/2022 04/18/22   Azucena Fallen, MD  sertraline (ZOLOFT) 100 MG tablet Take 200 mg by mouth in the morning.    [provider]  traZODone (DESYREL) 100 MG tablet Take 1.5 tablets (150 mg total) by mouth at bedtime. 12/12/18   Pennie Banter, DO    Physical Exam:  Vitals:   01/30/23 1230 01/30/23 1245  BP: (!) 158/71 (!) 151/100  Pulse: 65 66  Resp: (!) 23 14  Temp:    SpO2: 98% 96%    Awake coherent pleasant edentulous no icterus no pallor--she is a little tender in her sinuses Neck soft supple S1-S2 no murmur No wheeze rales rhonchi Throat seems clear do not appreciate any erythema Abdomen soft no rebound no guarding Left lower extremity and reinforced packing and dressing after reduction Power 5/5 reflexes deferred  I have personally reviewed following labs and imaging studies  Labs:  WBC 13 cyclic down to 11 hemoglobin 15 Lactic acid 1.9 BUN/creatinine 15/0.9 COVID influenza RSV all negative-UA shows mucus present rare  bacteria negative nitrates leukocytes   Imaging studies:  CT right hip shows osteopenia without fracture stranding alongside bladder?  Cystitis CT head no acute finding no cervical injury X-ray knee osteopenia without fracture pelvis right knee or hip opacities rectosigmoid  Medical tests:  EKG independently reviewed:   Sinus rhythm, PR interval 0.20 QRS axis about 50 No ST-T wave changes across precordium  Test discussed with performing physician: Yes  Decision to obtain old records:  Yes  Review and summation of old records:  Yes  Principal Problem:   Ankle fracture   Assessment/Plan Accidental fall with ankle fracture Defer to orthopedics Received several rounds of fentanyl-give oxycodone x 1 now as pain 7/10-try to avoid medication with various criteria potential  Low-grade fever probable sinusitis Underwhelmed-she does have a mild leukocytosis and although lactic acid is a little high I do not see a source We will get a right respiratory viral pathogen panel and will empirically only watch I would keep on fluids 50 cc/H Resume Flonase from home If she spikes a high-grade fever we will need to follow the blood cultures and add empiric vancomycin and ceftriaxone-we have no source at this time and unless she is having diarrhea would not workup further and attribute this to a possible sinusitis  Prior orthostatic hypotension, BPPV Unclear etiology of fall keep on telemetry although this is chronic Resume in a.m. metoprolol 12.5 twice daily hold amlodipine adjust meds as needed  Depression with dementia On a lot of meds-continue BuSpar 30 twice daily, Ativan 0.5 nightly Zoloft every morning Trazodone for sleep 1.5 at bedtime Tried to de-escalate meds in the outpatient Can continue Aricept 10  IBS D Continue Bentyl 20 3 times daily, Imodium 2 mg as per home protocol Try to avoid Bentyl in elderly  Hypothyroid Continue Synthroid 100 mcg daily Monitor  Severity  of Illness: The appropriate patient status for this patient is INPATIENT. Inpatient status is judged to be reasonable and necessary in order to provide the required intensity of service to ensure the patient's safety. The patient's presenting symptoms, physical exam findings, and initial radiographic and laboratory data in the context of their chronic comorbidities is felt to place them at high risk for further clinical deterioration. Furthermore, it is not anticipated that the patient will be medically stable for discharge from the  hospital within 2 midnights of admission.   * I certify that at the point of admission it is my clinical judgment that the patient will require inpatient hospital care spanning beyond 2 midnights from the point of admission due to high intensity of service, high risk for further deterioration and high frequency of surveillance required.*   DVT prophylaxis: Lovenox Code Status: DNR has goldenrod at bedside Family Communication: None Consults called: Ortho  Time spent: 45 minutes  Mahala Menghini, MD [days-call my NP partners at night for Care related issues] [facility] --[facility] [employer] [place] , [email or web address]; password [facility]  01/30/2023, 1:10 PM

## 2023-01-30 NOTE — Anesthesia Procedure Notes (Signed)
Anesthesia Regional Block: Adductor canal block   Pre-Anesthetic Checklist: , timeout performed,  Correct Patient, Correct Site, Correct Laterality,  Correct Procedure, Correct Position, site marked,  Risks and benefits discussed,  Surgical consent,  Pre-op evaluation,  At surgeon's request and post-op pain management  Laterality: Right  Prep: Dura Prep       Needles:  Injection technique: Single-shot  Needle Type: Echogenic Stimulator Needle     Needle Length: 10cm  Needle Gauge: 20     Additional Needles:   Procedures:,,,, ultrasound used (permanent image in chart),,    Narrative:  Start time: 01/30/2023 3:00 PM End time: 01/30/2023 3:03 PM Injection made incrementally with aspirations every 5 mL.  Performed by: Personally  Anesthesiologist: Atilano Median, DO  Additional Notes: Patient identified. Risks/Benefits/Options discussed with patient including but not limited to bleeding, infection, nerve damage, failed block, incomplete pain control. Patient expressed understanding and wished to proceed. All questions were answered. Sterile technique was used throughout the entire procedure. Please see nursing notes for vital signs. Aspirated in 5cc intervals with injection for negative confirmation. Patient was given instructions on fall risk and not to get out of bed. All questions and concerns addressed with instructions to call with any issues or inadequate analgesia.

## 2023-01-30 NOTE — ED Provider Notes (Signed)
Physical Exam  BP (!) 139/58 (BP Location: Left Arm)   Pulse (!) 59   Temp 98.2 F (36.8 C) (Oral)   Resp 18   SpO2 90%   Physical Exam  Procedures  .Reduction of fracture  Date/Time: 01/30/2023 12:15 PM  Performed by: Margarita Grizzle, MD Authorized by: Margarita Grizzle, MD  Consent: Written consent obtained. Risks and benefits: risks, benefits and alternatives were discussed Consent given by: patient Patient understanding: patient states understanding of the procedure being performed Imaging studies: imaging studies available Required items: required blood products, implants, devices, and special equipment available Patient identity confirmed: verbally with patient and arm band Time out: Immediately prior to procedure a "time out" was called to verify the correct patient, procedure, equipment, support staff and site/side marked as required.  Sedation: Patient sedated: yes Sedatives: fentanyl and propofol Analgesia: fentanyl Sedation start date/time: 01/30/2023 12:20 PM Sedation end date/time: 01/30/2023 12:31 PM Vitals: Vital signs were monitored during sedation.  Patient tolerance: patient tolerated the procedure well with no immediate complications   .Sedation  Date/Time: 01/30/2023 12:31 PM  Performed by: Margarita Grizzle, MD Authorized by: Margarita Grizzle, MD   Consent:    Consent obtained:  Verbal and written   Consent given by:  Patient   Risks discussed:  Allergic reaction, dysrhythmia, inadequate sedation, nausea, prolonged hypoxia resulting in organ damage, prolonged sedation necessitating reversal, respiratory compromise necessitating ventilatory assistance and intubation and vomiting   Alternatives discussed:  Analgesia without sedation, anxiolysis and regional anesthesia Universal protocol:    Procedure explained and questions answered to patient or proxy's satisfaction: yes     Relevant documents present and verified: yes     Test results available: yes      Imaging studies available: yes     Required blood products, implants, devices, and special equipment available: yes     Site/side marked: yes     Immediately prior to procedure, a time out was called: yes     Patient identity confirmed:  Verbally with patient and arm band Indications:    Procedure performed:  Dislocation reduction   Procedure necessitating sedation performed by:  Physician performing sedation Pre-sedation assessment:    Time since last food or drink:  6   ASA classification: class 1 - normal, healthy patient     Mouth opening:  3 or more finger widths   Thyromental distance:  4 finger widths   Mallampati score:  I - soft palate, uvula, fauces, pillars visible   Neck mobility: normal     Pre-sedation assessments completed and reviewed: temperature   A pre-sedation assessment was completed prior to the start of the procedure Immediate pre-procedure details:    Reassessment: Patient reassessed immediately prior to procedure     Reviewed: vital signs, relevant labs/tests and NPO status     Verified: bag valve mask available, emergency equipment available, intubation equipment available, IV patency confirmed, oxygen available and suction available   Procedure details (see MAR for exact dosages):    Preoxygenation:  Nasal cannula   Sedation:  Propofol   Intended level of sedation: deep   Intra-procedure monitoring:  Blood pressure monitoring, cardiac monitor, continuous pulse oximetry, frequent LOC assessments, frequent vital sign checks and continuous capnometry   Intra-procedure events: none     Total Provider sedation time (minutes):  20 Post-procedure details:   A post-sedation assessment was completed following the completion of the procedure.   Attendance: Constant attendance by certified staff until patient recovered     Recovery:  Patient returned to pre-procedure baseline     Post-sedation assessments completed and reviewed: post-procedure nausea and vomiting status  not reviewed and pain score not reviewed     Patient is stable for discharge or admission: yes     Procedure completion:  Tolerated well, no immediate complications   ED Course / MDM   Clinical Course as of 01/30/23 1234  Tue Jan 30, 2023  1232 Chest x-[name] reviewed interpreted no evidence of acute abnormality noted Lactic acid reviewed interpreted within normal limits Respiratory panel reviewed and all are negative [DR]    Clinical Course User Index [DR] Margarita Grizzle, MD   Medical Decision Making Amount and/or Complexity of Data Reviewed Labs: ordered. Radiology: ordered.  Risk Prescription drug management.  Patient in ALF-  Patient seen loc here for fall. Patient states she began having infection symptoms runny nose, cough, fever, chills but was getting better last Wednesday.  Began vomiting last Wednesday night with productive cough. Some dyspnea Improved nausea and vomiting. Fell last night after sitting up in bed due to need to urinate and right leg went out and fell to floor. States she was picked up by EMS and brought to ED.  Work up here for hip pain with CT head, cervical spine right hip ct CBC with leukocytosis at 13.5 With left shift Care discussed with Dr. Thad Ranger, on-call for orthopedic surgery.  Requested that we attempt reduction in the ED and likely will go to the OR within the next 24 to 48 hours    1- fall  2- right ankle fx secondary to #1 3- febrile illness with some hypoxia here- cxr clear respiratory panel clear, no wheezing.  Sats maintained on [place]  Plan admission for further evaluation and treatment. Will require operative intervention when stable. Discussed with Dr. Mahala Menghini and will see for admission Reviewed post reduction x-[name] without significant reduction  Will message Dr.    Margarita Grizzle, MD 01/31/23 [phone]

## 2023-01-30 NOTE — ED Provider Notes (Signed)
[place] EMERGENCY DEPARTMENT AT [facility] Provider Note   CSN: [id number] Arrival date & time: 01/30/23  0034     History  Chief Complaint  Patient presents with   Marletta Lor    Erika Conley is a 88 y.o. female.  The history is provided by the patient.  Fall  She has history of hypertension and comes here from a skilled nursing facility where she fell.  She states that she was feeling nauseous and she tried to sit up in bed and slid.  She is complaining of pain in her right knee but denies other injury.  She denies hitting her neck or head.  She is not on any anticoagulants.   Home Medications Prior to Admission medications   Medication Sig Start Date End Date Taking? Authorizing Provider  acetaminophen (TYLENOL) 325 MG tablet Take 1 tablet (325 mg total) by mouth every 4 (four) hours as needed for headache. 04/18/22   Azucena Fallen, MD  amLODipine (NORVASC) 10 MG tablet Take 1 tablet (10 mg total) by mouth daily. 04/19/22   Azucena Fallen, MD  Biotin 5000 MCG CAPS Take 5,000 mcg by mouth in the morning.    [provider]  busPIRone (BUSPAR) 30 MG tablet Take 30 mg by mouth in the morning and at bedtime.    [provider]  calcium carbonate (TUMS - DOSED IN MG ELEMENTAL CALCIUM) 500 MG chewable tablet Chew 2-3 tablets by mouth 3 (three) times daily with meals as needed (for GERD).    [provider]  calcium-vitamin D (OSCAL WITH D) 500-5 MG-MCG tablet Take 2 tablets by mouth 2 (two) times daily with a meal. 04/18/22   Azucena Fallen, MD  cetirizine (ZYRTEC) 10 MG tablet Take 10 mg by mouth daily.    [provider]  dicyclomine (BENTYL) 20 MG tablet Take 20 mg by mouth 3 (three) times daily.    [provider]  donepezil (ARICEPT) 10 MG tablet Take 10 mg by mouth at bedtime.    [provider]  famotidine (PEPCID) 20 MG tablet Take 20 mg by mouth 2 (two) times daily.    [provider]   fluticasone (FLONASE) 50 MCG/ACT nasal spray Place 2 sprays into both nostrils daily.    [provider]  HYDROcodone-acetaminophen (NORCO/VICODIN) 5-325 MG tablet Take 1-2 tablets by mouth every 4 (four) hours as needed for moderate pain.    [provider]  hydrOXYzine (ATARAX) 10 MG tablet Take 1 tablet (10 mg total) by mouth every 6 (six) hours as needed for itching. Patient not taking: Reported on 07/21/2022 05/20/22   Arthor Captain, PA-C  Infant Care Products [facility]) OINT Apply 1 Application topically 3 (three) times daily. To buttocks    [provider]  ketoconazole (NIZORAL) 2 % cream Apply 1 Application topically daily. Apply to the vulva once a day for 14 days Patient not taking: Reported on 07/21/2022 05/20/22   Arthor Captain, PA-C  levothyroxine (SYNTHROID) 100 MCG tablet Take 100 mcg by mouth daily before breakfast.    [provider]  loperamide (IMODIUM A-D) 2 MG tablet Take 4 mg by mouth See admin instructions. Take 4 mg by mouth after 1st loose stool movement, and an additional tablet with each loose stool. (Max 8 tablets).    [provider]  LORazepam (ATIVAN) 0.5 MG tablet Take 1 tablet (0.5 mg total) by mouth every evening. 04/18/22   Azucena Fallen, MD  metoprolol tartrate Ria Bush)  25 MG tablet Take 0.5 tablets (12.5 mg total) by mouth 2 (two) times daily. 04/18/22   Azucena Fallen, MD  Multiple Vitamins-Minerals (PRESERVISION AREDS 2) CAPS Take 1 capsule by mouth in the morning and at bedtime.    [provider]  omeprazole (PRILOSEC) 40 MG capsule Take 1 capsule (40 mg total) by mouth 2 (two) times daily. Take shortly before breakfast and dinner meal Patient taking differently: Take 40 mg by mouth 2 (two) times daily. 07/16/18   Rachael Fee, MD  senna-docusate (SENOKOT-S) 8.6-50 MG tablet Take 1 tablet by mouth at bedtime as needed for mild constipation. Patient not taking: Reported on 07/21/2022  04/18/22   Azucena Fallen, MD  sertraline (ZOLOFT) 100 MG tablet Take 200 mg by mouth in the morning.    [provider]  traZODone (DESYREL) 100 MG tablet Take 1.5 tablets (150 mg total) by mouth at bedtime. 12/12/18   Esaw Grandchild A, DO      Allergies    Chocolate, Codeine, Cortisone, Guggulipid-black pepper, Morphine, Nsaids, and Other    Review of Systems   Review of Systems  All other systems reviewed and are negative.   Physical Exam Updated Vital Signs BP (!) 152/48   Pulse (!) 52   Temp 97.8 F (36.6 C) (Oral)   Resp 16   SpO2 94%  Physical Exam Vitals and nursing note reviewed.   88 year old female, resting comfortably and in no acute distress. Vital signs are significant for slightly slow heart rate and slightly elevated blood pressure. Oxygen saturation is 94%, which is normal. Head is normocephalic and atraumatic. PERRLA, EOMI. Oropharynx is clear. Neck is immobilized in a stiff cervical collar and is nontender. Back is nontender and there is no CVA tenderness. Lungs are clear without rales, wheezes, or rhonchi. Chest is nontender. Heart has regular rate and rhythm without murmur. Abdomen is soft, flat, nontender. Extremities: There is no swelling or deformity noted around the right knee and no tenderness to direct palpation but there is tenderness palpation over the lateral aspect of the right hip.  There is marked pain with logroll of the right leg.  Right leg is not shortened but is externally rotated.  Full range of motion of all joints in both arms and left leg. Skin is warm and dry without rash. Neurologic: Awake and alert, no focal deficits.  ED Results / Procedures / Treatments   Labs (all labs ordered are listed, but only abnormal results are displayed) Labs Reviewed  BASIC METABOLIC PANEL - Abnormal; Notable for the following components:      Result Value   Glucose, Bld 142 (*)    GFR, Estimated 57 (*)    All other components within  normal limits  CBC WITH DIFFERENTIAL/PLATELET - Abnormal; Notable for the following components:   WBC 13.5 (*)    Hemoglobin 15.3 (*)    Neutro Abs 12.5 (*)    Lymphs Abs 0.4 (*)    All other components within normal limits  PROTIME-INR  TYPE AND SCREEN    EKG EKG Interpretation Date/Time:  Tuesday January 30 2023 04:46:32 EST Ventricular Rate:  54 PR Interval:  187 QRS Duration:  91 QT Interval:  477 QTC Calculation: 453 R Axis:   -8  Text Interpretation: Sinus rhythm Borderline low voltage, extremity leads When compared with ECG of 04/03/2022, Premature atrial complexes are no longer present Confirmed by Dione Booze ([id number]) on 01/30/2023 4:53:29 AM  Radiology CT Hip Right Wo  Contrast  Result Date: 01/30/2023 CLINICAL DATA:  Fall with right hip trauma.  Negative x-rays today. EXAM: CT OF THE RIGHT HIP WITHOUT CONTRAST TECHNIQUE: Multidetector CT imaging of the right hip was performed according to the standard protocol. Multiplanar CT image reconstructions were also generated. RADIATION DOSE REDUCTION: This exam was performed according to the departmental dose-optimization program which includes automated exposure control, adjustment of the mA and/or kV according to patient size and/or use of iterative reconstruction technique. COMPARISON:  AP pelvis and right hip series today, AP pelvis and right hip films 10/13/2021, CT abdomen pelvis 10/13/2021. FINDINGS: Bones/Joint/Cartilage There is osteopenia without evidence of fractures or other focal pathologic process. 8 mm bone island is chronically noted right femoral neck and mild nonerosive right hip arthrosis. There is spurring of the pubic symphysis. Ligaments Suboptimally assessed by CT. Muscles and Tendons Muscle bulk is normal for age. No acute tendon abnormality is seen, no intramuscular hematoma or mass. Soft tissues No hematoma, mass or other significant findings. There is however, mild stranding along side the bladder. This was not  seen previously and could indicate cystitis. Correlate clinically with urinalysis. IMPRESSION: 1. Osteopenia and degenerative change without evidence of fractures. 2. Mild stranding along side the bladder. This was not seen previously and could indicate cystitis. Correlate clinically with urinalysis. Electronically Signed   By: Almira Bar M.D.   On: 01/30/2023 06:32   DG Hip Unilat With Pelvis 2-3 Views Right  Result Date: 01/30/2023 CLINICAL DATA:  Fall injury with right hip pain and right lower extremity pain. EXAM: DG HIP (WITH OR WITHOUT PELVIS) 2-3V RIGHT; RIGHT KNEE - COMPLETE 4+ VIEW COMPARISON:  AP pelvis and AP and frog-leg right hip views 10/13/2021, no previous right knee series. FINDINGS: AP pelvis and AP and frog-leg right hip: There is osteopenia without evidence of fractures of the proximal right femur. No pelvic fracture or diastasis is seen. There is mild spurring of the SI joints and pubic symphysis, and moderate left and mild right nonerosive bilateral hip DJD. No other focal bone abnormality is seen. There are stippled opacities in the rectosigmoid colon most likely due to medication fragments. Advanced degenerative disc disease lower lumbar spine. Right knee, four views: There is no significant suprapatellar bursal fluid. There is osteopenia without evidence of fracture or dislocation. There is moderate to advanced tricompartmental nonerosive degenerative arthrosis. No other focal bone abnormality is seen. Soft tissues are unremarkable. IMPRESSION: 1. Osteopenia and degenerative change without evidence of fractures of the pelvis, right hip or right knee. 2. Stippled opacities in the rectosigmoid colon most likely due to medication fragments. Electronically Signed   By: Almira Bar M.D.   On: 01/30/2023 04:51   DG Knee Complete 4 Views Right  Result Date: 01/30/2023 CLINICAL DATA:  Fall injury with right hip pain and right lower extremity pain. EXAM: DG HIP (WITH OR WITHOUT  PELVIS) 2-3V RIGHT; RIGHT KNEE - COMPLETE 4+ VIEW COMPARISON:  AP pelvis and AP and frog-leg right hip views 10/13/2021, no previous right knee series. FINDINGS: AP pelvis and AP and frog-leg right hip: There is osteopenia without evidence of fractures of the proximal right femur. No pelvic fracture or diastasis is seen. There is mild spurring of the SI joints and pubic symphysis, and moderate left and mild right nonerosive bilateral hip DJD. No other focal bone abnormality is seen. There are stippled opacities in the rectosigmoid colon most likely due to medication fragments. Advanced degenerative disc disease lower lumbar spine. Right knee, four  views: There is no significant suprapatellar bursal fluid. There is osteopenia without evidence of fracture or dislocation. There is moderate to advanced tricompartmental nonerosive degenerative arthrosis. No other focal bone abnormality is seen. Soft tissues are unremarkable. IMPRESSION: 1. Osteopenia and degenerative change without evidence of fractures of the pelvis, right hip or right knee. 2. Stippled opacities in the rectosigmoid colon most likely due to medication fragments. Electronically Signed   By: Almira Bar M.D.   On: 01/30/2023 04:51   DG Chest 1 View  Result Date: 01/30/2023 CLINICAL DATA:  Fall this morning with nausea EXAM: CHEST  1 VIEW COMPARISON:  07/21/2022 FINDINGS: Low volume chest with mild linear atelectasis or scarring. There is no edema, consolidation, effusion, or pneumothorax. Normal heart size and mediastinal contours for rotation. Remote left humeral neck fracture with accentuated glenohumeral osteoarthritis. IMPRESSION: No acute finding. Electronically Signed   By: Tiburcio Pea M.D.   On: 01/30/2023 04:41   CT HEAD WO CONTRAST  Result Date: 01/30/2023 CLINICAL DATA:  Head trauma, minor. EXAM: CT HEAD WITHOUT CONTRAST CT CERVICAL SPINE WITHOUT CONTRAST TECHNIQUE: Multidetector CT imaging of the head and cervical spine was  performed following the standard protocol without intravenous contrast. Multiplanar CT image reconstructions of the cervical spine were also generated. RADIATION DOSE REDUCTION: This exam was performed according to the departmental dose-optimization program which includes automated exposure control, adjustment of the mA and/or kV according to patient size and/or use of iterative reconstruction technique. COMPARISON:  07/21/2022 FINDINGS: CT HEAD FINDINGS Brain: No evidence of acute infarction, hemorrhage, hydrocephalus, extra-axial collection or mass lesion/mass effect. Vascular: No hyperdense vessel or unexpected calcification. Skull: Normal. Negative for fracture or focal lesion. Sinuses/Orbits: Mild, patchy mucosal thickening in the paranasal sinuses. CT CERVICAL SPINE FINDINGS Alignment: No traumatic malalignment Skull base and vertebrae: No acute fracture. No primary bone lesion or focal pathologic process. Soft tissues and spinal canal: Cystic density rightward of the T1-2 foramen is likely a root sleeve cysts. Extensive stylohyoid ligament ossification, incidental. Disc levels: Generalized degenerative endplate and especially facet spurring. Upper chest: No acute finding IMPRESSION: No evidence of acute intracranial or cervical spine injury. Electronically Signed   By: Tiburcio Pea M.D.   On: 01/30/2023 04:39   CT CERVICAL SPINE WO CONTRAST  Result Date: 01/30/2023 CLINICAL DATA:  Head trauma, minor. EXAM: CT HEAD WITHOUT CONTRAST CT CERVICAL SPINE WITHOUT CONTRAST TECHNIQUE: Multidetector CT imaging of the head and cervical spine was performed following the standard protocol without intravenous contrast. Multiplanar CT image reconstructions of the cervical spine were also generated. RADIATION DOSE REDUCTION: This exam was performed according to the departmental dose-optimization program which includes automated exposure control, adjustment of the mA and/or kV according to patient size and/or use of  iterative reconstruction technique. COMPARISON:  07/21/2022 FINDINGS: CT HEAD FINDINGS Brain: No evidence of acute infarction, hemorrhage, hydrocephalus, extra-axial collection or mass lesion/mass effect. Vascular: No hyperdense vessel or unexpected calcification. Skull: Normal. Negative for fracture or focal lesion. Sinuses/Orbits: Mild, patchy mucosal thickening in the paranasal sinuses. CT CERVICAL SPINE FINDINGS Alignment: No traumatic malalignment Skull base and vertebrae: No acute fracture. No primary bone lesion or focal pathologic process. Soft tissues and spinal canal: Cystic density rightward of the T1-2 foramen is likely a root sleeve cysts. Extensive stylohyoid ligament ossification, incidental. Disc levels: Generalized degenerative endplate and especially facet spurring. Upper chest: No acute finding IMPRESSION: No evidence of acute intracranial or cervical spine injury. Electronically Signed   By: Tiburcio Pea M.D.   On:  01/30/2023 04:39    Procedures Procedures    Medications Ordered in ED Medications  ondansetron (ZOFRAN) injection 4 mg (4 mg Intravenous Given 01/30/23 0435)  fentaNYL (SUBLIMAZE) injection 50 mcg (50 mcg Intravenous Given 01/30/23 0435)  fentaNYL (SUBLIMAZE) injection 50 mcg (50 mcg Intravenous Given 01/30/23 4132)    ED Course/ Medical Decision Making/ A&P                                 Medical Decision Making Amount and/or Complexity of Data Reviewed Labs: ordered. Radiology: ordered.  Risk Prescription drug management.   Fall with concern for right hip fracture.  I have ordered the ED hip fracture order set and ordered x-rays of right hip and right knee as well as chest and CT scans of head and cervical spine.  I have ordered fentanyl for pain (patient is intolerant of morphine).  I have reviewed her past records, and she had a prior ED visit for a fall on 07/21/2022.  I reviewed her electrocardiogram, and my interpretation is low voltage but no  acute changes.  I reviewed her laboratory test, and my interpretation is elevated random glucose level, mild leukocytosis which is nonspecific.  CT head and cervical spine showed no acute injury, x-rays of right hip and right knee showed osteopenia but no fracture, chest x-ray showed no acute cardiopulmonary process.  I have independently viewed all of the images, and agree with radiologist's interpretation.  I was concerned about possible occult hip fracture based on my exam, I ordered a CT of the right hip.  CT scan of the right hip shows osteopenia without fracture.  I have independently viewed the images, and agree with the radiologist's opinion.  No evidence of any fractures.  Therefore, I am discharging the patient to return to her skilled nursing facility.  Final Clinical Impression(s) / ED Diagnoses Final diagnoses:  Fall at nursing home, initial encounter  Acute pain of right knee  Elevated random blood glucose level    Rx / DC Orders ED Discharge Orders     None         Dione Booze, MD 01/30/23 [phone]

## 2023-01-30 NOTE — Progress Notes (Signed)
Orthopedic Tech Progress Note Patient Details:  Erika Conley [birth year] [id number]  Ortho Devices Type of Ortho Device: Short leg splint Ortho Device/Splint Location: posteriora nd stirrup applied Ortho Device/Splint Interventions: Ordered, Application, Adjustment   Post Interventions Patient Tolerated: Well Instructions Provided: Adjustment of device, Care of device  [name] 01/30/2023, 2:32 PM

## 2023-01-30 NOTE — Consult Note (Signed)
Orthopedic Consultation Note  Current Hospital Day : Hospital Day: 1  Reason For Consult: Right ankle fracture dislocation  History of Present Illness:  Erika Conley is a 88 y.o. female who presents to the emergency department this morning due to a fall and right ankle injury.  She resides in an assisted care facility unfortunately had what sounds like a syncopal fall earlier this morning.  She presented to emergency department where she was found to have a trimalleolar fracture dislocation with orthopedic surgery was consulted.  She presently endorses right ankle pain.  She has no pain in her other extremities.  She states that she normally ambulates with a walker.  She has never had any right ankle surgery before.  Past Medical History:  Diagnosis Date   Anxiety    Arthritis    Depression    Falls frequently    Fibromyalgia    Fracture, clavicle    GERD (gastroesophageal reflux disease)    History of fractured vertebra    Hypertension    Hypothyroidism    Seasonal allergies    Thyroid disease    Vertigo    Vitamin A deficiency    Wrist fracture, right     Past Surgical History:  Procedure Laterality Date   ABDOMINAL HYSTERECTOMY     BACK SURGERY     CHOLECYSTECTOMY     ERCP N/A 10/22/2015   Procedure: ENDOSCOPIC RETROGRADE CHOLANGIOPANCREATOGRAPHY (ERCP);  Surgeon: Rachael Fee, MD;  Location: [facility];  Service: Endoscopy;  Laterality: N/A;   ERCP N/A 11/25/2015   Procedure: ENDOSCOPIC RETROGRADE CHOLANGIOPANCREATOGRAPHY (ERCP);  Surgeon: Rachael Fee, MD;  Location: [facility];  Service: Endoscopy;  Laterality: N/A;   EUS N/A 11/25/2015   Procedure: ESOPHAGEAL ENDOSCOPIC ULTRASOUND (EUS) RADIAL;  Surgeon: Rachael Fee, MD;  Location: [facility];  Service: Endoscopy;  Laterality: N/A;   FRACTURE SURGERY     THYROIDECTOMY     WRIST SURGERY      Prior to Admission medications   Medication Sig Start Date End Date Taking? Authorizing Provider   acetaminophen (TYLENOL) 325 MG tablet Take 1 tablet (325 mg total) by mouth every 4 (four) hours as needed for headache. 04/18/22   Azucena Fallen, MD  amLODipine (NORVASC) 10 MG tablet Take 1 tablet (10 mg total) by mouth daily. 04/19/22   Azucena Fallen, MD  Biotin 5000 MCG CAPS Take 5,000 mcg by mouth in the morning.    [provider]  busPIRone (BUSPAR) 30 MG tablet Take 30 mg by mouth in the morning and at bedtime.    [provider]  calcium carbonate (TUMS - DOSED IN MG ELEMENTAL CALCIUM) 500 MG chewable tablet Chew 2-3 tablets by mouth 3 (three) times daily with meals as needed (for GERD).    [provider]  calcium-vitamin D (OSCAL WITH D) 500-5 MG-MCG tablet Take 2 tablets by mouth 2 (two) times daily with a meal. 04/18/22   Azucena Fallen, MD  cetirizine (ZYRTEC) 10 MG tablet Take 10 mg by mouth daily.    [provider]  dicyclomine (BENTYL) 20 MG tablet Take 20 mg by mouth 3 (three) times daily.    [provider]  donepezil (ARICEPT) 10 MG tablet Take 10 mg by mouth at bedtime.    [provider]  famotidine (PEPCID) 20 MG tablet Take 20 mg by mouth 2 (two) times daily.    [provider]  fluticasone (FLONASE) 50 MCG/ACT nasal spray Place 2 sprays into  both nostrils daily.    [provider]  HYDROcodone-acetaminophen (NORCO/VICODIN) 5-325 MG tablet Take 1-2 tablets by mouth every 4 (four) hours as needed for moderate pain.    [provider]  hydrOXYzine (ATARAX) 10 MG tablet Take 1 tablet (10 mg total) by mouth every 6 (six) hours as needed for itching. Patient not taking: Reported on 07/21/2022 05/20/22   Arthor Captain, PA-C  Infant [employer] Products [facility]) OINT Apply 1 Application topically 3 (three) times daily. To buttocks    [provider]  ketoconazole (NIZORAL) 2 % cream Apply 1 Application topically daily. Apply to the vulva once a day for 14 days Patient not  taking: Reported on 07/21/2022 05/20/22   Arthor Captain, PA-C  levothyroxine (SYNTHROID) 100 MCG tablet Take 100 mcg by mouth daily before breakfast.    [provider]  loperamide (IMODIUM A-D) 2 MG tablet Take 4 mg by mouth See admin instructions. Take 4 mg by mouth after 1st loose stool movement, and an additional tablet with each loose stool. (Max 8 tablets).    [provider]  LORazepam (ATIVAN) 0.5 MG tablet Take 1 tablet (0.5 mg total) by mouth every evening. 04/18/22   Azucena Fallen, MD  metoprolol tartrate (LOPRESSOR) 25 MG tablet Take 0.5 tablets (12.5 mg total) by mouth 2 (two) times daily. 04/18/22   Azucena Fallen, MD  Multiple Vitamins-Minerals (PRESERVISION AREDS 2) CAPS Take 1 capsule by mouth in the morning and at bedtime.    [provider]  omeprazole (PRILOSEC) 40 MG capsule Take 1 capsule (40 mg total) by mouth 2 (two) times daily. Take shortly before breakfast and dinner meal Patient taking differently: Take 40 mg by mouth 2 (two) times daily. 07/16/18   Rachael Fee, MD  senna-docusate (SENOKOT-S) 8.6-50 MG tablet Take 1 tablet by mouth at bedtime as needed for mild constipation. Patient not taking: Reported on 07/21/2022 04/18/22   Azucena Fallen, MD  sertraline (ZOLOFT) 100 MG tablet Take 200 mg by mouth in the morning.    [provider]  traZODone (DESYREL) 100 MG tablet Take 1.5 tablets (150 mg total) by mouth at bedtime. 12/12/18   Pennie Banter, DO    Physical Examination Right Lower Extremity: Splint currently in place + ankle dorsiflexion/plantarflexion/EHL SILT SP/DP/T Foot wwp   Imaging: X-rays of the right ankle demonstrate trimalleolar ankle fracture dislocation  Assessment:   Erika Conley is a 88 y.o. female presents emergency department with trimalleolar ankle fracture dislocation.  Is persistently dislocated after splinting.  I recommend urgent reduction in the OR with either external  fixation or open reduction internal fixation.  Discussed this with both her and her son including risks of surgery including risk of anesthesia, nonunion, malunion, ankle pain, arthritis and stiffness, infection, hardware failure neurovascular injury among others.  Plan:   Nonweightbearing splint OR today for reduction, internal versus external fixation Please remain n.p.o.

## 2023-01-30 NOTE — Anesthesia Procedure Notes (Signed)
Anesthesia Regional Block: Popliteal block   Pre-Anesthetic Checklist: , timeout performed,  Correct Patient, Correct Site, Correct Laterality,  Correct Procedure, Correct Position, site marked,  Risks and benefits discussed,  Surgical consent,  Pre-op evaluation,  At surgeon's request and post-op pain management  Laterality: Right  Prep: Dura Prep       Needles:  Injection technique: Single-shot  Needle Type: Echogenic Stimulator Needle     Needle Length: 10cm  Needle Gauge: 20     Additional Needles:   Procedures:,,,, ultrasound used (permanent image in chart),,    Narrative:  Start time: 01/30/2023 3:04 PM End time: 01/30/2023 3:07 PM Injection made incrementally with aspirations every 5 mL.  Performed by: Personally  Anesthesiologist: Atilano Median, DO  Additional Notes: Patient identified. Risks/Benefits/Options discussed with patient including but not limited to bleeding, infection, nerve damage, failed block, incomplete pain control. Patient expressed understanding and wished to proceed. All questions were answered. Sterile technique was used throughout the entire procedure. Please see nursing notes for vital signs. Aspirated in 5cc intervals with injection for negative confirmation. Patient was given instructions on fall risk and not to get out of bed. All questions and concerns addressed with instructions to call with any issues or inadequate analgesia.

## 2023-01-30 NOTE — ED Notes (Signed)
Went to check on patient, patient reporting severe pain to the right ankle. Visualized ankle and obvious deformity noted. Informed morning MD's due to patient being up for discharge and night MD having left. Xray's ordered by morning MD's.

## 2023-01-30 NOTE — Brief Op Note (Signed)
01/30/2023  5:40 PM  PATIENT:  Erika Conley  88 y.o. female  PRE-OPERATIVE DIAGNOSIS:  right ankle fracture dislocation  POST-OPERATIVE DIAGNOSIS:  right ankle fracture dislocation  PROCEDURE:  Procedure(s) with comments: OPEN REDUCTION INTERNAL FIXATION (ORIF) ANKLE FRACTURE (Right) - regular table, C-arm, arthrex  SURGEON:  Surgeons and Role:    Rosalee Kaufman, MD - Primary  PHYSICIAN ASSISTANT:   ASSISTANTS: None  ANESTHESIA:   general  EBL:  20 mL   BLOOD ADMINISTERED:none  DRAINS: none   LOCAL MEDICATIONS USED:  NONE  SPECIMEN:  No Specimen  DISPOSITION OF SPECIMEN:  N/A  COUNTS:  YES  TOURNIQUET:   Total Tourniquet Time Documented: Thigh (Right) - 59 minutes Total: Thigh (Right) - 59 minutes   DICTATION: .Reubin Milan Dictation  PLAN OF CARE: Admit to inpatient   PATIENT DISPOSITION:  PACU - hemodynamically stable.   Delay start of Pharmacological VTE agent (>24hrs) due to surgical blood loss or risk of bleeding: no

## 2023-01-30 NOTE — ED Notes (Signed)
Dr Rosalia Hammers at bedside placing orders due to patient being hypoxic and febrile.

## 2023-01-30 NOTE — Progress Notes (Addendum)
Orthopaedic Surgery Progress Note  Comfortable in PACU.  NAE  Exam: Right Lower Extremity: Splint in place Neuroexam limited due to block Foot wwp   Assessment: 88 y.o. female Day of Surgery status post open reduction internal fixation of right ankle trimalleolar fracture dislocation  Plan: Strict nonweightbearing right lower extremity Maintain splint Elevate lower extremity at all times OK to resume DVT chemoppx immediately postoperatively Perioperative ancef for 24 PT beginning on POD#1 Discharge instructions placed in Epic

## 2023-01-30 NOTE — Transfer of Care (Signed)
Immediate Anesthesia Transfer of Care Note  Patient: Erika Conley  Procedure(s) Performed: OPEN REDUCTION INTERNAL FIXATION (ORIF) ANKLE FRACTURE (Right: Ankle)  Patient Location: PACU  Anesthesia Type:General  Level of Consciousness: drowsy  Airway & Oxygen Therapy: Patient connected to T-piece oxygen  Post-op Assessment: Report given to RN and Post -op Vital signs reviewed and stable  Post vital signs: Reviewed and stable  Last Vitals:  Vitals Value Taken Time  BP 161/60 01/30/23 1736  Temp 37.6 C 01/30/23 1736  Pulse 61 01/30/23 1739  Resp 19 01/30/23 1739  SpO2 97 % 01/30/23 1739  Vitals shown include unfiled device data.  Last Pain:  Vitals:   01/30/23 1510  TempSrc:   PainSc: 1          Complications: No notable events documented.

## 2023-01-31 ENCOUNTER — Encounter (HOSPITAL_COMMUNITY): Payer: Self-pay | Admitting: Sports Medicine

## 2023-01-31 DIAGNOSIS — S82891A Other fracture of right lower leg, initial encounter for closed fracture: Secondary | ICD-10-CM | POA: Diagnosis not present

## 2023-01-31 LAB — CBC
HCT: 34.2 % — ABNORMAL LOW (ref 36.0–46.0)
Hemoglobin: 11.4 g/dL — ABNORMAL LOW (ref 12.0–15.0)
MCH: 30.4 pg (ref 26.0–34.0)
MCHC: 33.3 g/dL (ref 30.0–36.0)
MCV: 91.2 fL (ref 80.0–100.0)
Platelets: 161 10*3/uL (ref 150–400)
RBC: 3.75 MIL/uL — ABNORMAL LOW (ref 3.87–5.11)
RDW: 12.9 % (ref 11.5–15.5)
WBC: 7.5 10*3/uL (ref 4.0–10.5)
nRBC: 0 % (ref 0.0–0.2)

## 2023-01-31 LAB — COMPREHENSIVE METABOLIC PANEL
ALT: 12 U/L (ref 0–44)
AST: 21 U/L (ref 15–41)
Albumin: 3.1 g/dL — ABNORMAL LOW (ref 3.5–5.0)
Alkaline Phosphatase: 38 U/L (ref 38–126)
Anion gap: 7 (ref 5–15)
BUN: 20 mg/dL (ref 8–23)
CO2: 25 mmol/L (ref 22–32)
Calcium: 8 mg/dL — ABNORMAL LOW (ref 8.9–10.3)
Chloride: 100 mmol/L (ref 98–111)
Creatinine, Ser: 0.78 mg/dL (ref 0.44–1.00)
GFR, Estimated: >60 mL/min (ref >60)
Glucose, Bld: 152 mg/dL — ABNORMAL HIGH (ref 70–99)
Potassium: 3.9 mmol/L (ref 3.5–5.1)
Sodium: 132 mmol/L — ABNORMAL LOW (ref 135–145)
Total Bilirubin: 0.2 mg/dL (ref <1.2)
Total Protein: 5.3 g/dL — ABNORMAL LOW (ref 6.5–8.1)

## 2023-01-31 LAB — MRSA NEXT GEN BY PCR, NASAL: MRSA by PCR Next Gen: NOT DETECTED

## 2023-01-31 MED ORDER — ACETAMINOPHEN 500 MG PO TABS
500.0000 mg | ORAL_TABLET | Freq: Four times a day (QID) | ORAL | Status: DC | PRN
  Filled 2023-01-31: qty 1

## 2023-01-31 MED ORDER — CELECOXIB 100 MG PO CAPS
100.0000 mg | ORAL_CAPSULE | Freq: Two times a day (BID) | ORAL | Status: DC
  Administered 2023-01-31: 100 mg via ORAL
  Filled 2023-01-31 (×2): qty 1

## 2023-01-31 MED ORDER — TRAMADOL HCL 50 MG PO TABS
50.0000 mg | ORAL_TABLET | Freq: Four times a day (QID) | ORAL | Status: DC | PRN
  Administered 2023-01-31 – 2023-02-03 (×8): 50 mg via ORAL
  Filled 2023-01-31 (×8): qty 1

## 2023-01-31 MED ORDER — DONEPEZIL HCL 10 MG PO TABS
10.0000 mg | ORAL_TABLET | Freq: Every day | ORAL | Status: DC
  Administered 2023-01-31 – 2023-02-02 (×3): 10 mg via ORAL
  Filled 2023-01-31 (×3): qty 1

## 2023-01-31 MED ORDER — CARMEX CLASSIC LIP BALM EX OINT
TOPICAL_OINTMENT | CUTANEOUS | Status: DC | PRN
  Administered 2023-01-31: 1 via TOPICAL
  Filled 2023-01-31: qty 10

## 2023-01-31 NOTE — Evaluation (Signed)
Physical Therapy Evaluation Patient Details Name: LAWONNA MINOTT MRN: [id number] DOB: Apr 28, 1934 Today's Date: 01/31/2023  History of Present Illness  Pt is 88 yo female admitted s/p fall with R ankle fx.  Pt s/p ORIF on 01/30/23.  Pt with hx including but not limited to anxiety, arthritis, depression, fibromyalgia, GERD, HTN, vertigo , orthostatic hypotension, fractured vertebra with surgery early 2024  Clinical Impression  Pt admitted with above diagnosis. At baseline, pt from ALF and ambulatory with rollator.  Today, pt agreeable to therapy, fair pain control (premedicated, did have some nausea with movement), and was able to stand with min A but mod A to pivot to/from [facility].  Pt expected to progress well with therapy but ambulation likely limited until no longer NWB.  Pt currently with functional limitations due to the deficits listed below (see PT Problem List). Pt will benefit from acute skilled PT to increase their independence and safety with mobility to allow discharge.  Patient will benefit from continued inpatient follow up therapy, <3 hours/day at d/c         If plan is discharge home, recommend the following: A lot of help with bathing/dressing/bathroom;A lot of help with walking and/or transfers   Can travel by private vehicle   No    Equipment Recommendations Other (comment);Wheelchair (measurements PT);Wheelchair cushion (measurements PT) (youth RW)  Recommendations for Other Services       Functional Status Assessment Patient has had a recent decline in their functional status and demonstrates the ability to make significant improvements in function in a reasonable and predictable amount of time.     Precautions / Restrictions Precautions Precautions: Fall Required Braces or Orthoses: Splint/Cast Splint/Cast: R ankle in splint Splint/Cast - Date Prophylactic Dressing Applied (if applicable): 01/30/23 Restrictions Weight Bearing Restrictions: Yes RLE Weight Bearing:  Non weight bearing      Mobility  Bed Mobility Overal bed mobility: Needs Assistance Bed Mobility: Supine to Sit     Supine to sit: Mod assist     General bed mobility comments: Pt needing min A for R leg, lifted trunk without assist, but mod A to scoot forward    Transfers Overall transfer level: Needs assistance Equipment used: Rolling walker (2 wheels) Transfers: Sit to/from Stand, Bed to chair/wheelchair/BSC Sit to Stand: +2 safety/equipment, Min assist Stand pivot transfers: Mod assist, +2 safety/equipment         General transfer comment: Had +2 for safety but pt able to stand with light min A.  Stood x 1 from bed and x 2 for [facility].  Required mod A with pivot to and from [facility]- Cues and assist to keep RW close, assist to maintain NWB, and assist to complete pivot    Ambulation/Gait               General Gait Details: not able  Stairs            Wheelchair Mobility     Tilt Bed    Modified Rankin (Stroke Patients Only)       Balance Overall balance assessment: Needs assistance Sitting-balance support: No upper extremity supported Sitting balance-Leahy Scale: Good     Standing balance support: Bilateral upper extremity supported Standing balance-Leahy Scale: Poor Standing balance comment: RW, min A, but also having to balance on single LE                             Pertinent Vitals/Pain Pain Assessment Pain  Assessment: 0-10 Pain Score: 6  Pain Location: R ankle Pain Descriptors / Indicators: Discomfort, Sharp Pain Intervention(s): Limited activity within patient's tolerance, Monitored during session, Repositioned, Premedicated before session    Home Living Family/patient expects to be discharged to:: Assisted living Living Arrangements: Other (Comment) Available Help at Discharge: Available PRN/intermittently (staff at ALF)   Home Access: Level entry       Home Layout: One level Home Equipment: Rollator (4 wheels)       Prior Function               Mobility Comments: Ambulates to dining hall with rollator; reports frequent falls (hx of vertigo and orthostatic hypotension) ADLs Comments: Independent dressing and toileting; has assist with shower     Extremity/Trunk Assessment   Upper Extremity Assessment Upper Extremity Assessment: Generalized weakness    Lower Extremity Assessment Lower Extremity Assessment: LLE deficits/detail;RLE deficits/detail RLE Deficits / Details: R ankle in splint, able to move and feel toes.  Knee and hip ROM WFL and MMT at least 3/5 not further tested LLE Deficits / Details: ROM WFL; MMT 5/5    Cervical / Trunk Assessment Cervical / Trunk Assessment: Kyphotic;Back Surgery;Other exceptions Cervical / Trunk Exceptions: Reports fractured back earlier this year and had surgery  Communication      Cognition Arousal: Alert Behavior During Therapy: WFL for tasks assessed/performed Overall Cognitive Status: Within Functional Limits for tasks assessed                                          General Comments      Exercises     Assessment/Plan    PT Assessment Patient needs continued PT services  PT Problem List Decreased strength;Decreased range of motion;Decreased activity tolerance;Decreased balance;Decreased mobility;Pain;Decreased knowledge of use of DME;Decreased knowledge of precautions       PT Treatment Interventions DME instruction;Therapeutic exercise;Gait training;Balance training;Functional mobility training;Therapeutic activities;Patient/family education;Neuromuscular re-education;Modalities    PT Goals (Current goals can be found in the Care Plan section)  Acute Rehab PT Goals Patient Stated Goal: decrease pain, return to walking PT Goal Formulation: With patient/family Time For Goal Achievement: 02/14/23 Potential to Achieve Goals: Good    Frequency Min 1X/week     Co-evaluation               AM-PAC PT "6 Clicks"  Mobility  Outcome Measure Help needed turning from your back to your side while in a flat bed without using bedrails?: A Little Help needed moving from lying on your back to sitting on the side of a flat bed without using bedrails?: A Lot Help needed moving to and from a bed to a chair (including a wheelchair)?: A Lot Help needed standing up from a chair using your arms (e.g., wheelchair or bedside chair)?: A Little Help needed to walk in hospital room?: Total Help needed climbing 3-5 steps with a railing? : Total 6 Click Score: 12    End of Session Equipment Utilized During Treatment: Gait belt Activity Tolerance: Patient tolerated treatment well Patient left: in bed;with call bell/phone within reach;with bed alarm set (R leg elevated 2 pillows) Nurse Communication: Mobility status PT Visit Diagnosis: Other abnormalities of gait and mobility (R26.89);Muscle weakness (generalized) (M62.81)    Time: 5784-6962 PT Time Calculation (min) (ACUTE ONLY): 33 min   Charges:   PT Evaluation $PT Eval Moderate Complexity: 1 Mod PT Treatments $Therapeutic  Activity: 8-22 mins PT General Charges $$ ACUTE PT VISIT: 1 Visit         Anise Salvo, PT Acute Rehab Services [facility] [phone]   Rayetta Humphrey 01/31/2023, 5:33 PM

## 2023-01-31 NOTE — Progress Notes (Signed)
[facility] Progress Note Patient: Erika Conley OZD:[id number] DOB: 1934-11-08 DOA: 01/30/2023  DOS: the patient was seen and examined on 01/31/2023  Brief hospital course: PMH of hypothyroidism, depression, IBS-D present to the hospital with complaints of fall with ankle fracture. Orthopedic consulted.  Underwent open treatment of the right trimalleolar ankle fracture as well as open repair of syndesmosis right ankle.  Assessment and Plan: Accidental fall with ankle fracture status post open reduction internal fixation of right ankle trimalleolar fracture dislocation Strict nonweightbearing right lower extremity Maintain splint Elevate lower extremity at all times Received perioperative ancef for 24 Outpatient follow-up recommended.   Low-grade fever probable sinusitis Coronavirus-NOT COVID Respiratory distress improving. Continue current care. No indication for antibiotic for now.   Prior orthostatic hypotension, BPPV Unclear etiology of fall keep on telemetry although this is chronic Given bradycardia we will be holding Lopressor.   Depression with dementia On a lot of meds-continue BuSpar 30 twice daily, Ativan 0.5 nightly Zoloft every morning Trazodone for sleep 1.5 at bedtime Tried to de-escalate meds in the outpatient Can continue Aricept 10   IBS D Continue Bentyl 20 3 times daily, Imodium 2 mg as per home protocol Try to avoid Bentyl in elderly   Hypothyroid Continue Synthroid 100 mcg daily Monitor  Subjective: Abdominal pain chronic but unchanged.  No nausea no vomiting.  Diarrhea chronic and unchanged. No fever no chills.  Physical Exam: General: in Mild distress, No Rash Cardiovascular: S1 and S2 Present, No Murmur Respiratory: Good respiratory effort, Bilateral Air entry present. No Crackles, No wheezes Abdomen: Bowel Sound present, mild abdominal tenderness Extremities: No edema Neuro: Alert and oriented x3, no new focal deficit  Data  Reviewed: I have Reviewed nursing notes, Vitals, and Lab results. Since last encounter, pertinent lab results CBC and BMP   . I have ordered test including CBC and BMP  .   Disposition: Status is: Inpatient Remains inpatient appropriate because: Improvement in pain control  heparin injection 5,000 Units Start: 01/30/23 1400   Family Communication: No one at bedside Level of care: Telemetry switch to MedSurg tomorrow. Vitals:   01/30/23 2314 01/31/23 0644 01/31/23 0900 01/31/23 1330  BP: (!) 128/52 (!) 132/58  (!) 142/52  Pulse: 65 (!) 55  (!) 57  Resp: 16 18  16   Temp: 98.6 F (37 C) 98.4 F (36.9 C)  99.6 F (37.6 C)  TempSrc: Oral Oral    SpO2: 92% 97% 91% 95%  Weight:      Height:         Author: Lynden Oxford, MD 01/31/2023 7:46 PM  Please look on [email or web address] to find out who is on call.

## 2023-01-31 NOTE — Plan of Care (Signed)
  Problem: Clinical Measurements: Goal: Will remain free from infection Outcome: Progressing   Problem: Activity: Goal: Risk for activity intolerance will decrease Outcome: Progressing   Problem: Coping: Goal: Level of anxiety will decrease Outcome: Progressing   Problem: Skin Integrity: Goal: Risk for impaired skin integrity will decrease Outcome: Progressing

## 2023-01-31 NOTE — Progress Notes (Signed)
Orthopaedic Surgery Progress Note  Having some pain today. Yet to do PT. No other issues. Glad to talk about her surgery.  Exam: Right Lower Extremity: Splint in place +EHL/FHL Foot wwp   Assessment: 88 y.o. female POD#1 status post open reduction internal fixation of right ankle trimalleolar fracture dislocation  Plan: Strict nonweightbearing right lower extremity Maintain splint Elevate lower extremity at all times OK to resume DVT chemoppx immediately postoperatively - currently SQ[facility] Perioperative ancef for 24 PT beginning on POD#1 Pain medications adjusted due to intolerance of oxycodone Discharge instructions placed in Epic

## 2023-02-01 DIAGNOSIS — S82891A Other fracture of right lower leg, initial encounter for closed fracture: Secondary | ICD-10-CM | POA: Diagnosis not present

## 2023-02-01 LAB — CBC
HCT: 31.2 % — ABNORMAL LOW (ref 36.0–46.0)
Hemoglobin: 10.6 g/dL — ABNORMAL LOW (ref 12.0–15.0)
MCH: 30.9 pg (ref 26.0–34.0)
MCHC: 34 g/dL (ref 30.0–36.0)
MCV: 91 fL (ref 80.0–100.0)
Platelets: 138 10*3/uL — ABNORMAL LOW (ref 150–400)
RBC: 3.43 MIL/uL — ABNORMAL LOW (ref 3.87–5.11)
RDW: 13.1 % (ref 11.5–15.5)
WBC: 6.1 10*3/uL (ref 4.0–10.5)
nRBC: 0 % (ref 0.0–0.2)

## 2023-02-01 LAB — BASIC METABOLIC PANEL
Anion gap: 6 (ref 5–15)
BUN: 18 mg/dL (ref 8–23)
CO2: 26 mmol/L (ref 22–32)
Calcium: 7.8 mg/dL — ABNORMAL LOW (ref 8.9–10.3)
Chloride: 103 mmol/L (ref 98–111)
Creatinine, Ser: 0.7 mg/dL (ref 0.44–1.00)
GFR, Estimated: >60 mL/min (ref >60)
Glucose, Bld: 98 mg/dL (ref 70–99)
Potassium: 3.6 mmol/L (ref 3.5–5.1)
Sodium: 135 mmol/L (ref 135–145)

## 2023-02-01 LAB — GLUCOSE, CAPILLARY: Glucose-Capillary: 105 mg/dL — ABNORMAL HIGH (ref 70–99)

## 2023-02-01 LAB — MAGNESIUM: Magnesium: 2 mg/dL (ref 1.7–2.4)

## 2023-02-01 MED ORDER — ACETAMINOPHEN 500 MG PO TABS
500.0000 mg | ORAL_TABLET | Freq: Three times a day (TID) | ORAL | Status: DC
  Administered 2023-02-01 – 2023-02-03 (×7): 500 mg via ORAL
  Filled 2023-02-01 (×7): qty 1

## 2023-02-01 MED ORDER — ONDANSETRON HCL 4 MG/2ML IJ SOLN
4.0000 mg | Freq: Four times a day (QID) | INTRAMUSCULAR | Status: DC | PRN
  Administered 2023-02-01: 4 mg via INTRAVENOUS
  Filled 2023-02-01: qty 2

## 2023-02-01 MED ORDER — DICLOFENAC SODIUM 1 % EX GEL: 2.0000 g | Freq: Two times a day (BID) | CUTANEOUS | Status: DC | PRN

## 2023-02-01 MED ORDER — CHOLESTYRAMINE LIGHT 4 G PO PACK
2.0000 g | PACK | Freq: Every day | ORAL | Status: DC
Start: 2023-02-01 — End: 2023-02-02
  Administered 2023-02-01 – 2023-02-02 (×2): 2 g via ORAL
  Filled 2023-02-01 (×2): qty 1

## 2023-02-01 NOTE — Anesthesia Postprocedure Evaluation (Signed)
Anesthesia Post Note  Patient: Erika Conley  Procedure(s) Performed: OPEN REDUCTION INTERNAL FIXATION (ORIF) ANKLE FRACTURE (Right: Ankle)     Patient location during evaluation: PACU Anesthesia Type: Regional and General Level of consciousness: awake and alert Pain management: pain level controlled Vital Signs Assessment: post-procedure vital signs reviewed and stable Respiratory status: spontaneous breathing, nonlabored ventilation, respiratory function stable and patient connected to nasal cannula oxygen Cardiovascular status: blood pressure returned to baseline and stable Postop Assessment: no apparent nausea or vomiting Anesthetic complications: no   No notable events documented.  Last Vitals:  Vitals:   01/31/23 2041 02/01/23 0521  BP: (!) 138/58 135/63  Pulse: 61 (!) 50  Resp: 18 17  Temp: 37.1 C 37 C  SpO2: 96% 97%    Last Pain:  Vitals:   02/01/23 0928  TempSrc:   PainSc: 3                  Earl Lites P Dylan Ruotolo

## 2023-02-01 NOTE — Progress Notes (Signed)
[facility] Progress Note Patient: Erika Conley:[id number] DOB: [birth year] DOA: 01/30/2023  DOS: the patient was seen and examined on 02/01/2023  Brief hospital course: PMH of hypothyroidism, depression, IBS-D present to the hospital with complaints of fall with ankle fracture. Orthopedic consulted.  Underwent open treatment of the right trimalleolar ankle fracture as well as open repair of syndesmosis right ankle.   Assessment and Plan: Accidental fall with ankle fracture status post open reduction internal fixation of right ankle trimalleolar fracture dislocation Strict nonweightbearing right lower extremity Maintain splint Elevate lower extremity at all times Received perioperative ancef for 24 Outpatient follow-up recommended.  Postop acute blood loss anemia. Expected. Baseline appears to be around 14.  Hemoglobin currently 10.6.  Platelet count also trending down.  Monitor and recheck.   Low-grade fever probable sinusitis Coronavirus-NOT COVID Respiratory distress improving. Continue current care. No indication for antibiotic for now.   Prior orthostatic hypotension, BPPV Unclear etiology of fall keep on telemetry although this is chronic Given bradycardia we will be holding Lopressor.   Depression with dementia On a lot of meds-continue BuSpar 30 twice daily, Ativan 0.5 nightly Zoloft every morning Trazodone for sleep 1.5 at bedtime Tried to de-escalate meds in the outpatient Can continue Aricept 10   IBS D Continue Bentyl 20 3 times daily, Imodium 2 mg as per home protocol   Hypothyroid Continue Synthroid 100 mcg daily Monitor   Subjective: No nausea no vomiting.  Had 3 episodes of less diarrhea this morning.  Abdominal pain that is chronic.  No blood in the stool.  Physical Exam: General: in Mild distress, No Rash Cardiovascular: S1 and S2 Present, No Murmur Respiratory: Good respiratory effort, Bilateral Air entry present. No Crackles, No  wheezes Abdomen: Bowel Sound present, diffuse abdominal tenderness Extremities: No edema Neuro: Alert and oriented x3, no new focal deficit  Data Reviewed: I have Reviewed nursing notes, Vitals, and Lab results. Since last encounter, pertinent lab results CBC and BMP   . I have ordered test including CBC and BMP  .   Disposition: Status is: Inpatient Remains inpatient appropriate because: Monitor for improvement in abdominal pain diarrhea and stability of hemoglobin  heparin injection 5,000 Units Start: 01/30/23 1400   Family Communication: No one at bedside Level of care: Med-Surg   Vitals:   01/31/23 1330 01/31/23 2041 02/01/23 0521 02/01/23 1327  BP: (!) 142/52 (!) 138/58 135/63 (!) 147/52  Pulse: (!) 57 61 (!) 50 (!) 54  Resp: 16 18 17 13   Temp: 99.6 F (37.6 C) 98.7 F (37.1 C) 98.6 F (37 C) 97.7 F (36.5 C)  TempSrc:  Oral Oral Oral  SpO2: 95% 96% 97% 99%  Weight:      Height:         Author: Lynden Oxford, MD 02/01/2023 6:48 PM  Please look on [email or web address] to find out who is on call.

## 2023-02-01 NOTE — TOC Initial Note (Signed)
Transition of Care [facility]) - Initial/Assessment Note    Patient Details  Name: Erika Conley MRN: [id number] Date of Birth: August 27, 1934  [facility]) CM/SW Contact:    Amada Jupiter, LCSW Phone Number: 02/01/2023, 12:49 PM  Clinical Narrative:                 Met with pt today to discuss dc needs.  Pt confirms that she was admitted from ALF at [name].  Explained to her that PT is recommending SNF for rehab due to fx and limited mobility.  Pt is agreeable with this plan.  Pt agreeable for CSW to speak with her son, Rocky Link, to review plan as well.  Contacted son who understands recommendations but makes point that decision on which facility will be "completely up to her... I have revoked my power of attorney several months ago."  Son notes "some estrangement" between pt and her daughter and that he has stepped back from involvement in pt's care decisions.  He is agreeable with CSW keeping him updated on dc.  In addition to son, I spoke with liaison at [facility]) who confirms that they could not meet pt's current assistance needs and recommends SNF.  Will begin SNF bed search process.  Expected Discharge Plan: Skilled Nursing Facility Barriers to Discharge: Insurance Authorization, SNF Pending bed offer   Patient Goals and CMS Choice Patient states their goals for this hospitalization and ongoing recovery are:: return to ALF after rehab          Expected Discharge Plan and Services In-house Referral: Clinical Social Work   Post Acute Care Choice: Skilled Nursing Facility Living arrangements for the past 2 months: Assisted Living Facility [employer] [place])                                      Prior Living Arrangements/Services Living arrangements for the past 2 months: Assisted Living Facility [employer] [place]) Lives with:: Facility Resident Patient language and need for interpreter reviewed:: Yes Do you feel safe going back to the place where you  live?: Yes      Need for Family Participation in Patient Care: No (Comment) Care giver support system in place?: Yes (comment)   Criminal Activity/Legal Involvement Pertinent to Current Situation/Hospitalization: No - Comment as needed  Activities of Daily Living   ADL Screening (condition at time of admission) Independently performs ADLs?: No Does the patient have a NEW difficulty with bathing/dressing/toileting/self-feeding that is expected to last >3 days?: No Does the patient have a NEW difficulty with getting in/out of bed, walking, or climbing stairs that is expected to last >3 days?: Yes (Initiates electronic notice to provider for possible PT consult) Does the patient have a NEW difficulty with communication that is expected to last >3 days?: Yes (Initiates electronic notice to provider for possible SLP consult) Is the patient deaf or have difficulty hearing?: No Does the patient have difficulty seeing, even when wearing glasses/contacts?: No Does the patient have difficulty concentrating, remembering, or making decisions?: No  Permission Sought/Granted Permission sought to share information with : Family Supports Permission granted to share information with : Yes, Verbal Permission Granted  Share Information with NAME: son, [place] Slevin @ [phone]           Emotional Assessment Appearance:: Appears stated age Attitude/Demeanor/Rapport: Gracious Affect (typically observed): Calm, Accepting Orientation: : Oriented to Self, Oriented to Place, Oriented  to  Time, Oriented to Situation Alcohol / Substance Use: Not Applicable Psych Involvement: No (comment)  Admission diagnosis:  Ankle fracture [S82.899A] Elevated random blood glucose level [R73.9] Fall at nursing home, initial encounter ([phone].Lorne Skeens, Y92.129] Acute pain of right knee [M25.561] Patient Active Problem List   Diagnosis Date Noted   Ankle fracture 01/30/2023   Pressure injury of skin 04/16/2022   Lumbar vertebral  fracture (HCC) 04/03/2022   Anxiety and depression 04/03/2022   HTN (hypertension) 04/03/2022   Dementia without behavioral disturbance (HCC) 04/03/2022   Traumatic compression fracture of L1 lumbar vertebra (HCC) 04/03/2022   Syncope and collapse 12/09/2018   Syncope 12/08/2018   Nausea and vomiting 12/14/2015   Hyponatremia 12/13/2015   Obstructive jaundice    Bile duct stricture    Pancreatitis 10/23/2015   Abdominal pain 10/22/2015   Pancreatic mass 10/22/2015   Common bile duct (CBD) obstruction    Abnormal LFTs    Dehydration with hyponatremia 04/09/2015   Acute respiratory failure with hypoxia (HCC) 04/09/2015   Thrombocytopenia (HCC) 04/09/2015   Hypokalemia 11/10/2014   Hypertension 11/10/2014   Hypothyroidism 11/10/2014   Syncope due to orthostatic hypotension 11/10/2014   Constipated    UTI (urinary tract infection) 06/04/2013   Altered mental status 06/04/2013   PCP:  Oneita Hurt, No Pharmacy:   [facility] [address] [place], [place] - [address] [place] [address] Phone: [phone] Fax: [phone]     Social Drivers of Health (SDOH) Social History: SDOH Screenings   Food Insecurity: No Food Insecurity (01/31/2023)  Housing: Patient Declined (01/31/2023)  Transportation Needs: No Transportation Needs (01/31/2023)  Utilities: Not At Risk (01/31/2023)  Tobacco Use: Low Risk  (01/30/2023)   SDOH Interventions:     Readmission Risk Interventions    02/01/2023   12:43 PM  Readmission Risk Prevention Plan  Transportation Screening Complete  PCP or Specialist Appt within 5-7 Days Complete  Home Care Screening Complete  Medication Review (RN CM) Complete

## 2023-02-01 NOTE — Hospital Course (Addendum)
Brief hospital course: PMH of hypothyroidism, depression, IBS-D present to the hospital with complaints of fall with ankle fracture. Orthopedic consulted.  Underwent open treatment of the right trimalleolar ankle fracture as well as open repair of syndesmosis right ankle.   Assessment and Plan: Accidental fall with ankle fracture status post open reduction internal fixation of right ankle trimalleolar fracture dislocation Strict nonweightbearing right lower extremity Maintain splint Elevate lower extremity at all times Received perioperative ancef for 24 Outpatient follow-up recommended.  Postop acute blood loss anemia. Expected. Baseline appears to be around 14.  Hemoglobin currently 10.6.  Platelet count also trending down.  Monitor and recheck.   Low-grade fever probable sinusitis Coronavirus-NOT COVID Respiratory distress improving. Continue current care. No indication for antibiotic for now.   Prior orthostatic hypotension, BPPV Unclear etiology of fall keep on telemetry although this is chronic Given bradycardia we will be holding Lopressor.   Depression with dementia On a lot of meds-continue BuSpar 30 twice daily, Ativan 0.5 nightly Zoloft every morning Trazodone for sleep 1.5 at bedtime Tried to de-escalate meds in the outpatient Can continue Aricept 10   IBS D Continue Bentyl 20 3 times daily, Imodium 2 mg as per home protocol   Hypothyroid Continue Synthroid 100 mcg daily Monitor

## 2023-02-01 NOTE — NC FL2 (Signed)
MEDICAID FL2 LEVEL OF CARE FORM     IDENTIFICATION  Patient Name: Erika Conley Birthdate: Jun 12, 1934 Sex: female Admission Date (Current Location): 01/30/2023  [facility] [place] Number:  Producer, television/film/video and Address:  [facility],  [address], [place] [address]      Provider Number: [id number]  Attending Physician Name and Address:  Rolly Salter, MD  Relative Name and Phone Number:  son, Korrine Perl @ [phone]    Current Level of Care: Hospital Recommended Level of Care: Skilled Nursing Facility Prior Approval Number:    Date Approved/Denied:   PASRR Number: [id number]  Discharge Plan: SNF    Current Diagnoses: Patient Active Problem List   Diagnosis Date Noted   Ankle fracture 01/30/2023   Pressure injury of skin 04/16/2022   Lumbar vertebral fracture (HCC) 04/03/2022   Anxiety and depression 04/03/2022   HTN (hypertension) 04/03/2022   Dementia without behavioral disturbance (HCC) 04/03/2022   Traumatic compression fracture of L1 lumbar vertebra (HCC) 04/03/2022   Syncope and collapse 12/09/2018   Syncope 12/08/2018   Nausea and vomiting 12/14/2015   Hyponatremia 12/13/2015   Obstructive jaundice    Bile duct stricture    Pancreatitis 10/23/2015   Abdominal pain 10/22/2015   Pancreatic mass 10/22/2015   Common bile duct (CBD) obstruction    Abnormal LFTs    Dehydration with hyponatremia 04/09/2015   Acute respiratory failure with hypoxia (HCC) 04/09/2015   Thrombocytopenia (HCC) 04/09/2015   Hypokalemia 11/10/2014   Hypertension 11/10/2014   Hypothyroidism 11/10/2014   Syncope due to orthostatic hypotension 11/10/2014   Constipated    UTI (urinary tract infection) 06/04/2013   Altered mental status 06/04/2013    Orientation RESPIRATION BLADDER Height & Weight     Self, Time, Situation, Place  O2 Incontinent Weight: 119 lb 14.9 oz (54.4 kg) Height:  5\' 3"  (160 cm)  BEHAVIORAL SYMPTOMS/MOOD NEUROLOGICAL  BOWEL NUTRITION STATUS      Incontinent Diet (regular)  AMBULATORY STATUS COMMUNICATION OF NEEDS Skin   Extensive Assist Verbally Other (Comment) (surgican incision only)                       Personal Care Assistance Level of Assistance  Bathing, Dressing Bathing Assistance: Limited assistance   Dressing Assistance: Limited assistance     Functional Limitations Info  Hearing, Speech, Sight Sight Info: Adequate Hearing Info: Adequate Speech Info: Adequate    SPECIAL CARE FACTORS FREQUENCY  PT (By licensed PT), OT (By licensed OT)     PT Frequency: 5x/wk OT Frequency: 5x/wk            Contractures      Additional Factors Info  Code Status, Allergies, Psychotropic Code Status Info: DNR Allergies Info: Beta Sitosterol (Sitosterols), Blueberry (Vaccinium Angustifolium), Chocolate, Codeine, Cortisone, Guggulipid-black Pepper, Morphine, Nsaids, Other Psychotropic Info: see MAR         Current Medications (02/01/2023):  This is the current hospital active medication list Current Facility-Administered Medications  Medication Dose Route Frequency Provider Last Rate Last Admin   acetaminophen (TYLENOL) tablet 500 mg  500 mg Oral Q6H PRN Rosalee Kaufman, MD       acetaminophen (TYLENOL) tablet 500 mg  500 mg Oral TID Rolly Salter, MD   500 mg at 02/01/23 0918   busPIRone (BUSPAR) tablet 30 mg  30 mg Oral BID Rosalee Kaufman, MD   30 mg at 02/01/23 0919   diclofenac Sodium (VOLTAREN) 1 % topical  gel 2 g  2 g Topical BID PRN Rolly Salter, MD       dicyclomine (BENTYL) tablet 20 mg  20 mg Oral TID Rosalee Kaufman, MD   20 mg at 02/01/23 0918   donepezil (ARICEPT) tablet 10 mg  10 mg Oral QHS Rolly Salter, MD   10 mg at 01/31/23 2100   famotidine (PEPCID) tablet 20 mg  20 mg Oral BID Rosalee Kaufman, MD   20 mg at 02/01/23 0919   fluticasone (FLONASE) 50 MCG/ACT nasal spray 2 spray  2 spray Each Nare Daily Rosalee Kaufman, MD   2 spray at 02/01/23 0919   heparin  injection 5,000 Units  5,000 Units Subcutaneous Q8H Rosalee Kaufman, MD   5,000 Units at 02/01/23 4098   hydrALAZINE (APRESOLINE) injection 5 mg  5 mg Intravenous Q4H PRN Rosalee Kaufman, MD       levothyroxine (SYNTHROID) tablet 100 mcg  100 mcg Oral QAC breakfast Rosalee Kaufman, MD   100 mcg at 02/01/23 0640   lip balm (CARMEX) ointment   Topical PRN Rolly Salter, MD   1 Application at 01/31/23 1454   loperamide (IMODIUM) capsule 4 mg  4 mg Oral PRN Rosalee Kaufman, MD   4 mg at 01/31/23 0802   LORazepam (ATIVAN) tablet 0.5 mg  0.5 mg Oral QPM Rosalee Kaufman, MD   0.5 mg at 01/31/23 1818   morphine (PF) 2 MG/ML injection 2 mg  2 mg Intravenous Q2H PRN Rosalee Kaufman, MD       pantoprazole (PROTONIX) EC tablet 40 mg  40 mg Oral Daily Rosalee Kaufman, MD   40 mg at 02/01/23 1191   senna-docusate (Senokot-S) tablet 1 tablet  1 tablet Oral QHS PRN Rosalee Kaufman, MD       sertraline (ZOLOFT) tablet 200 mg  200 mg Oral q AM Rosalee Kaufman, MD   200 mg at 02/01/23 0640   traMADol (ULTRAM) tablet 50 mg  50 mg Oral Q6H PRN Rosalee Kaufman, MD   50 mg at 02/01/23 1240   traZODone (DESYREL) tablet 150 mg  150 mg Oral QHS Rosalee Kaufman, MD   150 mg at 01/31/23 2100     Discharge Medications: Please see discharge summary for a list of discharge medications.  Relevant Imaging Results:  Relevant Lab Results:   Additional Information SS#: [id number]  Amada Jupiter, LCSW

## 2023-02-02 DIAGNOSIS — S82891A Other fracture of right lower leg, initial encounter for closed fracture: Secondary | ICD-10-CM | POA: Diagnosis not present

## 2023-02-02 LAB — CBC
HCT: 33 % — ABNORMAL LOW (ref 36.0–46.0)
Hemoglobin: 11 g/dL — ABNORMAL LOW (ref 12.0–15.0)
MCH: 30.5 pg (ref 26.0–34.0)
MCHC: 33.3 g/dL (ref 30.0–36.0)
MCV: 91.4 fL (ref 80.0–100.0)
Platelets: 142 10*3/uL — ABNORMAL LOW (ref 150–400)
RBC: 3.61 MIL/uL — ABNORMAL LOW (ref 3.87–5.11)
RDW: 13 % (ref 11.5–15.5)
WBC: 5.1 10*3/uL (ref 4.0–10.5)
nRBC: 0 % (ref 0.0–0.2)

## 2023-02-02 LAB — BASIC METABOLIC PANEL
Anion gap: 6 (ref 5–15)
BUN: 13 mg/dL (ref 8–23)
CO2: 29 mmol/L (ref 22–32)
Calcium: 8.1 mg/dL — ABNORMAL LOW (ref 8.9–10.3)
Chloride: 102 mmol/L (ref 98–111)
Creatinine, Ser: 0.7 mg/dL (ref 0.44–1.00)
GFR, Estimated: >60 mL/min (ref >60)
Glucose, Bld: 93 mg/dL (ref 70–99)
Potassium: 3.7 mmol/L (ref 3.5–5.1)
Sodium: 137 mmol/L (ref 135–145)

## 2023-02-02 LAB — MAGNESIUM: Magnesium: 1.9 mg/dL (ref 1.7–2.4)

## 2023-02-02 MED ORDER — ENSURE ENLIVE PO LIQD
237.0000 mL | Freq: Three times a day (TID) | ORAL | Status: DC
  Administered 2023-02-02 – 2023-02-03 (×3): 237 mL via ORAL

## 2023-02-02 NOTE — TOC Progression Note (Signed)
Transition of Care [facility]) - Progression Note    Patient Details  Name: Erika Conley MRN: [id number] Date of Birth: 1934/12/26  [facility]) CM/SW Contact  Amada Jupiter, LCSW Phone Number: 02/02/2023, 2:37 PM  Clinical Narrative:     Have reviewed SNF bed offers with pt and she has accepted bed at [facility] and can admit pt tomorrow.  Have alerted MD and pt's son.    Expected Discharge Plan: Skilled Nursing Facility Barriers to Discharge: English as a second language teacher, SNF Pending bed offer  Expected Discharge Plan and Services In-house Referral: Clinical Social Work   Post Acute Care Choice: Skilled Nursing Facility Living arrangements for the past 2 months: Assisted Living Facility [employer] [place])                                       Social Determinants of Health (SDOH) Interventions SDOH Screenings   Food Insecurity: No Food Insecurity (01/31/2023)  Housing: Patient Declined (01/31/2023)  Transportation Needs: No Transportation Needs (01/31/2023)  Utilities: Not At Risk (01/31/2023)  Tobacco Use: Low Risk  (01/30/2023)    Readmission Risk Interventions    02/01/2023   12:43 PM  Readmission Risk Prevention Plan  Transportation Screening Complete  PCP or Specialist Appt within 5-7 Days Complete  Home Care Screening Complete  Medication Review (RN CM) Complete

## 2023-02-02 NOTE — Progress Notes (Signed)
[facility] Progress Note Patient: Erika Conley:[id number] DOB: 10-17-1934 DOA: 01/30/2023  DOS: the patient was seen and examined on 02/02/2023  Brief hospital course: PMH of hypothyroidism, depression, IBS-D present to the hospital with complaints of fall with ankle fracture. Orthopedic consulted.  Underwent open treatment of the right trimalleolar ankle fracture as well as open repair of syndesmosis right ankle.   Assessment and Plan: Accidental fall with ankle fracture status post open reduction internal fixation of right ankle trimalleolar fracture dislocation Strict nonweightbearing right lower extremity Maintain splint Elevate lower extremity at all times Received perioperative ancef for 24 Outpatient follow-up recommended.  Postop acute blood loss anemia. Expected. Baseline appears to be around 14.  Hemoglobin currently 10.6.  Platelet count also trending down.  Monitor and recheck.   Low-grade fever probable sinusitis Coronavirus-NOT COVID Respiratory distress improving. Continue current care. No indication for antibiotic for now.   Prior orthostatic hypotension, BPPV Unclear etiology of fall keep on telemetry although this is chronic Given bradycardia we will be holding Lopressor.   Depression with dementia On a lot of meds-continue BuSpar 30 twice daily, Ativan 0.5 nightly Zoloft every morning Trazodone for sleep 1.5 at bedtime Tried to de-escalate meds in the outpatient Can continue Aricept 10   IBS D Continue Bentyl 20 3 times daily, Imodium 2 mg as per home protocol   Hypothyroid Continue Synthroid 100 mcg daily Monitor   Subjective: No acute complaint.  Diarrhea resolved.  No fever no chills.  Physical Exam: In mild distress. S1-S2 present. Bowel sound present.  Nontender today.  Data Reviewed: I have Reviewed nursing notes, Vitals, and Lab results. Reviewed CBC and CMP.  Reorder CBC and CMP.  Disposition: Status is:  Inpatient Remains inpatient appropriate because: Likely will be stable for discharge to SNF tomorrow on 12/14.  heparin injection 5,000 Units Start: 01/30/23 1400   Family Communication: No one at bedside Level of care: Med-Surg   Vitals:   02/01/23 1327 02/01/23 1959 02/02/23 0505 02/02/23 1323  BP: (!) 147/52 (!) 142/51 (!) 145/74 (!) 149/58  Pulse: (!) 54 (!) 57 74 62  Resp: 13 20 18 18   Temp: 97.7 F (36.5 C) 98.7 F (37.1 C) 98.5 F (36.9 C) 98.4 F (36.9 C)  TempSrc: Oral Oral Oral   SpO2: 99% 97% 99% 99%  Weight:      Height:         Author: Lynden Oxford, MD 02/02/2023 7:08 PM  Please look on [email or web address] to find out who is on call.

## 2023-02-02 NOTE — Plan of Care (Signed)
  Problem: Pain Management: Goal: General experience of comfort will improve Outcome: Progressing   Problem: Safety: Goal: Ability to remain free from injury will improve Outcome: Progressing

## 2023-02-02 NOTE — Progress Notes (Signed)
Physical Therapy Treatment Patient Details Name: Erika Conley MRN: [id number] DOB: [birth year] Today's Date: 02/02/2023   History of Present Illness Pt is 88 yo female admitted s/p fall with R ankle fx.  Pt s/p ORIF on 01/30/23.  Pt with hx including but not limited to anxiety, arthritis, depression, fibromyalgia, GERD, HTN, vertigo , orthostatic hypotension, fractured vertebra with surgery early 2024    PT Comments  Pt admitted with above diagnosis.  Pt currently with functional limitations due to the deficits listed below (see PT Problem List). Pt in bed resting when PT arrived. Pt agreeable to therapy intervention. Pain managed prior to mobility and pt indicated 6/10 following tx session. Pt required increased time and CGA with use of hospital bed with cues for supine to sit, pt able to sit EOB and maintain R LE NWB status, pt is aware and able to verbalize R LE NWB, pt observed with one exception when in standing and able to correct with cues, pt required min A for sit to stand  and min A and cues with increased time for SPT to recliner on L and in L LE stance with B UE support at RW and cues. Pt left seated in recliner all needs in place. Pt d/c plan remains appropriate with pt requiring continued skilled PT intervention < 3 hrs/day. Pt will benefit from acute skilled PT to increase their independence and safety with mobility to allow discharge.      If plan is discharge home, recommend the following: A lot of help with bathing/dressing/bathroom;A lot of help with walking and/or transfers   Can travel by private vehicle     No  Equipment Recommendations  Other (comment);Wheelchair (measurements PT);Wheelchair cushion (measurements PT) (youth RW)    Recommendations for Other Services       Precautions / Restrictions Precautions Precautions: Fall Required Braces or Orthoses: Splint/Cast Splint/Cast: R ankle in splint Splint/Cast - Date Prophylactic Dressing Applied (if applicable):  01/30/23 Restrictions Weight Bearing Restrictions Per Provider Order: Yes RLE Weight Bearing Per Provider Order: Non weight bearing     Mobility  Bed Mobility Overal bed mobility: Needs Assistance Bed Mobility: Supine to Sit     Supine to sit: Contact guard, HOB elevated, Used rails     General bed mobility comments: pt required cues, increased time and encouragement for increased IND and cues for weight shift to scoot anteriorly seated EOB    Transfers Overall transfer level: Needs assistance Equipment used: Rolling walker (2 wheels) Transfers: Sit to/from Stand, Bed to chair/wheelchair/BSC Sit to Stand: Min assist Stand pivot transfers: Min assist         General transfer comment: pt required sequential cues for SPT bed to recliner on L side with pt able to pivot/shimmy on L foot to recliner with increased time, pt required min A for eccentric control and cues for proper UE placement, pt demonstrated good recall and observation for R LE NWB with one episode of poor adhearance to WB precuations with PTs foot under pts to provide cues    Ambulation/Gait               General Gait Details: not able   Stairs             Wheelchair Mobility     Tilt Bed    Modified Rankin (Stroke Patients Only)       Balance Overall balance assessment: Needs assistance Sitting-balance support: No upper extremity supported (L UE support only) Sitting balance-Leahy Scale: Good  Standing balance support: Bilateral upper extremity supported, During functional activity, Reliant on assistive device for balance Standing balance-Leahy Scale: Poor Standing balance comment: RW, min A to CGA, but also having to balance on single LE                            Cognition Arousal: Alert Behavior During Therapy: WFL for tasks assessed/performed Overall Cognitive Status: Within Functional Limits for tasks assessed                                           Exercises      General Comments        Pertinent Vitals/Pain Pain Assessment Pain Assessment: 0-10 Pain Score: 6  Pain Location: R ankle Pain Descriptors / Indicators: Discomfort, Sharp Pain Intervention(s): Limited activity within patient's tolerance, Monitored during session, Premedicated before session, Repositioned, Ice applied    Home Living                          Prior Function            PT Goals (current goals can now be found in the care plan section) Acute Rehab PT Goals Patient Stated Goal: decrease pain, return to walking PT Goal Formulation: With patient/family Time For Goal Achievement: 02/14/23 Potential to Achieve Goals: Good Progress towards PT goals: Progressing toward goals    Frequency    Min 1X/week      PT Plan      Co-evaluation              AM-PAC PT "6 Clicks" Mobility   Outcome Measure  Help needed turning from your back to your side while in a flat bed without using bedrails?: A Little Help needed moving from lying on your back to sitting on the side of a flat bed without using bedrails?: A Little Help needed moving to and from a bed to a chair (including a wheelchair)?: A Little Help needed standing up from a chair using your arms (e.g., wheelchair or bedside chair)?: A Little Help needed to walk in hospital room?: Total Help needed climbing 3-5 steps with a railing? : Total 6 Click Score: 14    End of Session Equipment Utilized During Treatment: Gait belt Activity Tolerance: Patient tolerated treatment well Patient left: with call bell/phone within reach;in chair (R leg elevated 2 pillows) Nurse Communication: Mobility status PT Visit Diagnosis: Other abnormalities of gait and mobility (R26.89);Muscle weakness (generalized) (M62.81)     Time: 4098-1191 PT Time Calculation (min) (ACUTE ONLY): 26 min  Charges:    $Therapeutic Activity: 23-37 mins PT General Charges $$ ACUTE PT VISIT: 1  Visit                     Johnny Bridge, PT Acute Rehab    Jacqualyn Posey 02/02/2023, 4:29 PM

## 2023-02-03 DIAGNOSIS — S82891A Other fracture of right lower leg, initial encounter for closed fracture: Secondary | ICD-10-CM | POA: Diagnosis not present

## 2023-02-03 DIAGNOSIS — K580 Irritable bowel syndrome with diarrhea: Secondary | ICD-10-CM | POA: Diagnosis present

## 2023-02-03 LAB — BASIC METABOLIC PANEL
Anion gap: 6 (ref 5–15)
BUN: 16 mg/dL (ref 8–23)
CO2: 29 mmol/L (ref 22–32)
Calcium: 7.8 mg/dL — ABNORMAL LOW (ref 8.9–10.3)
Chloride: 99 mmol/L (ref 98–111)
Creatinine, Ser: 0.63 mg/dL (ref 0.44–1.00)
GFR, Estimated: >60 mL/min (ref >60)
Glucose, Bld: 95 mg/dL (ref 70–99)
Potassium: 3.6 mmol/L (ref 3.5–5.1)
Sodium: 134 mmol/L — ABNORMAL LOW (ref 135–145)

## 2023-02-03 LAB — CBC
HCT: 32.2 % — ABNORMAL LOW (ref 36.0–46.0)
Hemoglobin: 10.6 g/dL — ABNORMAL LOW (ref 12.0–15.0)
MCH: 30.3 pg (ref 26.0–34.0)
MCHC: 32.9 g/dL (ref 30.0–36.0)
MCV: 92 fL (ref 80.0–100.0)
Platelets: 172 10*3/uL (ref 150–400)
RBC: 3.5 MIL/uL — ABNORMAL LOW (ref 3.87–5.11)
RDW: 13 % (ref 11.5–15.5)
WBC: 5.5 10*3/uL (ref 4.0–10.5)
nRBC: 0 % (ref 0.0–0.2)

## 2023-02-03 LAB — MAGNESIUM: Magnesium: 2 mg/dL (ref 1.7–2.4)

## 2023-02-03 MED ORDER — HYDROCODONE-ACETAMINOPHEN 5-325 MG PO TABS: 1.0000 | ORAL_TABLET | Freq: Four times a day (QID) | ORAL | 0 refills | Status: DC | PRN

## 2023-02-03 MED ORDER — DICYCLOMINE HCL 20 MG PO TABS: 20.0000 mg | ORAL_TABLET | Freq: Three times a day (TID) | ORAL | 0 refills | Status: AC

## 2023-02-03 MED ORDER — LORAZEPAM 0.5 MG PO TABS: ORAL_TABLET | ORAL | 0 refills | Status: DC

## 2023-02-03 MED ORDER — HYDROCODONE-ACETAMINOPHEN 5-325 MG PO TABS
1.0000 | ORAL_TABLET | ORAL | Status: DC | PRN
  Administered 2023-02-03: 1 via ORAL
  Filled 2023-02-03: qty 1

## 2023-02-03 NOTE — Plan of Care (Signed)
  Problem: Education: Goal: Knowledge of General Education information will improve Description: Including pain rating scale, medication(s)/side effects and non-pharmacologic comfort measures Outcome: Adequate for Discharge   Problem: Health Behavior/Discharge Planning: Goal: Ability to manage health-related needs will improve Outcome: Adequate for Discharge   Problem: Clinical Measurements: Goal: Ability to maintain clinical measurements within normal limits will improve Outcome: Adequate for Discharge Goal: Will remain free from infection Outcome: Adequate for Discharge Goal: Diagnostic test results will improve Outcome: Adequate for Discharge Goal: Respiratory complications will improve Outcome: Adequate for Discharge Goal: Cardiovascular complication will be avoided Outcome: Adequate for Discharge   Problem: Activity: Goal: Risk for activity intolerance will decrease 02/03/2023 1327 by Alice Rieger A, LPN Outcome: Adequate for Discharge 02/03/2023 1114 by Alice Rieger A, LPN Outcome: Progressing   Problem: Nutrition: Goal: Adequate nutrition will be maintained Outcome: Adequate for Discharge   Problem: Coping: Goal: Level of anxiety will decrease 02/03/2023 1327 by Alice Rieger A, LPN Outcome: Adequate for Discharge 02/03/2023 1114 by Delila Spence, LPN Outcome: Progressing   Problem: Elimination: Goal: Will not experience complications related to bowel motility Outcome: Adequate for Discharge Goal: Will not experience complications related to urinary retention Outcome: Adequate for Discharge   Problem: Pain Management: Goal: General experience of comfort will improve 02/03/2023 1327 by Delila Spence, LPN Outcome: Adequate for Discharge 02/03/2023 1114 by Delila Spence, LPN Outcome: Progressing   Problem: Safety: Goal: Ability to remain free from injury will improve Outcome: Adequate for Discharge   Problem: Skin Integrity: Goal: Risk for  impaired skin integrity will decrease Outcome: Adequate for Discharge

## 2023-02-03 NOTE — Discharge Summary (Signed)
Physician Discharge Summary   Patient: DELTA SIMARD MRN: [id number] DOB: 1935-01-13  Admit date:     01/30/2023  Discharge date: 02/03/23  Discharge Physician: Lynden Oxford  PCP: Pcp, No  Recommendations at discharge:  Follow up with Orthopedics in 1 week   Contact information for follow-up providers     PCP. Schedule an appointment as soon as possible for a visit in 1 week(s).   Why: with CBC lab to look at blood counts, with BMP lab to look at kidney and electrolytes        Rosalee Kaufman, MD. Schedule an appointment as soon as possible for a visit in 1 week(s).   Specialty: Sports Medicine Why: For wound re-check Contact information: [address] [place] [address] [phone]              Contact information for after-discharge care     Destination     [facility] .   Service: Skilled Nursing Contact information: [address] [place] [address] [phone]                    Discharge Diagnoses: Principal Problem:   Ankle fracture Active Problems:   Hypothyroidism   Anxiety and depression   HTN (hypertension)   Dementia without behavioral disturbance (HCC)   Irritable bowel syndrome with diarrhea  Brief hospital course: PMH of hypothyroidism, depression, IBS-D present to the hospital with complaints of fall with ankle fracture. Orthopedic consulted.  Underwent open treatment of the right trimalleolar ankle fracture as well as open repair of syndesmosis right ankle.   Assessment and Plan: Accidental fall with ankle fracture status post open reduction internal fixation of right ankle trimalleolar fracture dislocation Strict nonweightbearing right lower extremity Maintain splint Elevate lower extremity at all times Received perioperative ancef for 24 Outpatient follow-up recommended.  Postop acute blood loss anemia. Expected. Baseline appears to be around 14.  Hemoglobin currently 10.6.  Platelet  count also trending down.  Monitor and recheck.   Low-grade fever probable sinusitis Coronavirus-NOT COVID Respiratory distress improving. Continue current care. No indication for antibiotic for now.   Prior orthostatic hypotension, BPPV Unclear etiology of fall No incidence on tele  Dizziness is chronic Given bradycardia we will be holding Lopressor.   Depression with dementia continue BuSpar 30 twice daily, Ativan 0.5 nightly Zoloft every morning Trazodone for sleep 1.5 at bedtime Try to de-escalate meds in the outpatient Can continue Aricept 10   IBS D Continue Bentyl 20 3 times daily, Imodium 2 mg as per home protocol   Hypothyroid Continue Synthroid 100 mcg daily Monitor  Pain control - [place] Controlled Substance Reporting System database was reviewed. and patient was instructed, not to drive, operate heavy machinery, perform activities at heights, swimming or participation in water activities or provide baby-sitting services while on Pain, Sleep and Anxiety Medications; until their outpatient Physician has advised to do so again. Also recommended to not to take more than prescribed Pain, Sleep and Anxiety Medications.  Consultants:  Orthopedics   Procedures performed:  01/30/2023 Surgery OPEN REDUCTION INTERNAL FIXATION (ORIF) ANKLE FRACTURE  DISCHARGE MEDICATION: Allergies as of 02/03/2023       Reactions   Beta Sitosterol [sitosterols] Other (See Comments)   "Allergic," per MAR   Blueberry [vaccinium Angustifolium] Other (See Comments)   "Allergic," per MAR   Chocolate Itching, Other (See Comments)   "Allergic," per MAR   Codeine Other (See Comments)   "Passes out and falls"  Cortisone Other (See Comments)   "Passes out and falls"- "Allergic," per MAR   Guggulipid-black Pepper Itching   Pepper   Morphine Other (See Comments)   "Allergic," per [facility]   Nsaids Other (See Comments)   "Allergic," per [facility]   Other Other (See Comments)   All narcotics  cause patient to "pass out and fall"        Medication List     STOP taking these medications    calcium-vitamin D 500-5 MG-MCG tablet Commonly known as: OSCAL WITH D   Geri-Tussin 100 MG/5ML liquid Generic drug: guaiFENesin   ketoconazole 2 % cream Commonly known as: NIZORAL   meclizine 25 MG tablet Commonly known as: ANTIVERT   metoprolol tartrate 25 MG tablet Commonly known as: LOPRESSOR       TAKE these medications    acetaminophen 325 MG tablet Commonly known as: TYLENOL Take 1 tablet (325 mg total) by mouth every 4 (four) hours as needed for headache. What changed:  how much to take reasons to take this   amLODipine 10 MG tablet Commonly known as: NORVASC Take 1 tablet (10 mg total) by mouth daily.   busPIRone 30 MG tablet Commonly known as: BUSPAR Take 30 mg by mouth in the morning and at bedtime.   calcium carbonate 500 MG chewable tablet Commonly known as: TUMS - dosed in mg elemental calcium Chew 2-3 tablets by mouth 3 (three) times daily with meals as needed (for GERD).   cetirizine 10 MG tablet Commonly known as: ZYRTEC Take 10 mg by mouth daily.   dicyclomine 20 MG tablet Commonly known as: BENTYL Take 1 tablet (20 mg total) by mouth 3 (three) times daily before meals. What changed: when to take this   donepezil 10 MG tablet Commonly known as: ARICEPT Take 10 mg by mouth at bedtime.   famotidine 20 MG tablet Commonly known as: PEPCID Take 20 mg by mouth 2 (two) times daily.   fluticasone 50 MCG/ACT nasal spray Commonly known as: FLONASE Place 1 spray into both nostrils in the morning and at bedtime.   HEALTHY EYES SUPERVISION 2 PO Take 1 capsule by mouth 2 (two) times daily.   HYDROcodone-acetaminophen 5-325 MG tablet Commonly known as: NORCO/VICODIN Take 1 tablet by mouth every 6 (six) hours as needed for moderate pain (pain score 4-6) or severe pain (pain score 7-10).   hydrOXYzine 10 MG tablet Commonly known as:  ATARAX Take 1 tablet (10 mg total) by mouth every 6 (six) hours as needed for itching.   levothyroxine 100 MCG tablet Commonly known as: SYNTHROID Take 100 mcg by mouth daily before breakfast.   loperamide 2 MG tablet Commonly known as: IMODIUM A-D Take 2-4 mg by mouth See admin instructions. Take 4 mg by mouth after the 1st loose stool movement and an additional 2 mg after each loose stool (CANNOT EXCEED 16 MG/24 HOURS)   LORazepam 0.5 MG tablet Commonly known as: ATIVAN Take 0.5 mg by mouth at 5 PM every day What changed:  how much to take how to take this when to take this additional instructions   omeprazole 40 MG capsule Commonly known as: PRILOSEC Take 1 capsule (40 mg total) by mouth 2 (two) times daily. Take shortly before breakfast and dinner meal What changed:  when to take this additional instructions   REFRESH OPTIVE OP Place 1 drop into both eyes See admin instructions. Instill 1 drop into one or both eyes up to 6 times a day as needed  for dryness   senna-docusate 8.6-50 MG tablet Commonly known as: Senokot-S Take 1 tablet by mouth at bedtime as needed for mild constipation.   sertraline 100 MG tablet Commonly known as: ZOLOFT Take 200 mg by mouth in the morning.   traZODone 150 MG tablet Commonly known as: DESYREL Take 150 mg by mouth at bedtime.   Voltaren 1 % Gel Generic drug: diclofenac Sodium Apply 2 g topically 2 (two) times daily as needed (for back pain).       Disposition: SNF Diet recommendation: Regular diet  Discharge Exam: Vitals:   02/02/23 2300 02/02/23 2325 02/03/23 0549 02/03/23 0632  BP: (!) 160/75 (!) 148/72 (!) 152/60 (!) 149/58  Pulse: 65  (!) 56 66  Resp: 19  16   Temp: 98.5 F (36.9 C)  98.9 F (37.2 C)   TempSrc: Oral  Oral   SpO2: 100%  92%   Weight:      Height:       General: Appear in no distress; no visible Abnormal Neck Mass Or lumps, Conjunctiva normal Cardiovascular: S1 and S2 Present, no  Murmur, Respiratory: good respiratory effort, Bilateral Air entry present and CTA, no Crackles, no wheezes Abdomen: Bowel Sound present, mil chronic tenderness Extremities: no Pedal edema Neurology: alert and oriented to time, place, and person  Filed Weights   01/30/23 1102 01/30/23 1441  Weight: 54.4 kg 54.4 kg   Condition at discharge: stable  The results of significant diagnostics from this hospitalization (including imaging, microbiology, ancillary and laboratory) are listed below for reference.   Imaging Studies: DG Ankle 2 Views Right Result Date: 01/30/2023 CLINICAL DATA:  Open reduction internal fixation EXAM: Intraoperative fluoroscopy COMPARISON:  X-ray 01/30/2023 earlier FINDINGS: Eighteen fluoroscopic spot images submitted for review demonstrates placement of paired oblique screws along the medial malleolus. Additional lateral fixation plate and screws along the distal fibula including 1 screw crossing to the tibial metaphysis. Imaging was obtained to aid in treatment. Please correlate with real-time fluoroscopy of 49.4 seconds. Cumulative air kerma 0.8792 mGy IMPRESSION: Intraoperative fluoroscopy Electronically Signed   By: Karen Kays M.D.   On: 01/30/2023 18:04   DG C-Arm 1-60 Min-No Report Result Date: 01/30/2023 Fluoroscopy was utilized by the requesting physician.  No radiographic interpretation.   DG C-Arm 1-60 Min-No Report Result Date: 01/30/2023 Fluoroscopy was utilized by the requesting physician.  No radiographic interpretation.   CT ANKLE RIGHT WO CONTRAST Result Date: 01/30/2023 CLINICAL DATA:  Right ankle injury after fall. EXAM: CT OF THE RIGHT ANKLE WITHOUT CONTRAST TECHNIQUE: Multidetector CT imaging of the right ankle was performed according to the standard protocol. Multiplanar CT image reconstructions were also generated. RADIATION DOSE REDUCTION: This exam was performed according to the departmental dose-optimization program which includes automated  exposure control, adjustment of the mA and/or kV according to patient size and/or use of iterative reconstruction technique. COMPARISON:  Right ankle x-rays from same day. FINDINGS: Bones/Joint/Cartilage Acute oblique fracture of the medial malleolus with significant lateral displacement. Acute oblique fracture of the distal fibular metaphysis with significant impaction and lateral and posterior displacement. Small acute avulsion fracture of the posterior malleolus. Lateral dislocation of the talus with respect to the tibial plafond. Associated valgus deformity with prominent eversion of the foot. Intact talar dome. Joint spaces are preserved. Severe osteopenia. No joint effusion. Ligaments Ligaments are suboptimally evaluated by CT. Muscles and Tendons Grossly intact. Soft tissue Diffuse soft tissue swelling. No fluid collection or hematoma. No soft tissue mass. IMPRESSION: 1. Trimalleolar right ankle  fracture-dislocation as described above. Electronically Signed   By: Obie Dredge M.D.   On: 01/30/2023 15:48   DG Ankle 2 Views Right Result Date: 01/30/2023 CLINICAL DATA:  Larey Seat, reduction EXAM: RIGHT ANKLE - 2 VIEW COMPARISON:  01/30/2023 FINDINGS: Frontal and cross-table lateral views of the right ankle are obtained. Cast material obscures underlying bony detail. Trimalleolar fracture dislocation of the right ankle is again noted, with persistent lateral dislocation of the talus relative to the tibial plafond. No change in alignment since prior study. Diffuse soft tissue swelling. IMPRESSION: 1. Stable trimalleolar fracture dislocation, with lateral dislocation of the talus as above. No change since prior exam. Electronically Signed   By: Sharlet Salina M.D.   On: 01/30/2023 14:43   DG Chest Port 1 View Result Date: 01/30/2023 CLINICAL DATA:  Low oxygen saturation. EXAM: PORTABLE CHEST 1 VIEW COMPARISON:  01/30/2023 at 4:16 a.m. and older studies. FINDINGS: Cardiac silhouette is normal in size. No  mediastinal or hilar masses. There are linear opacities at the left lung base consistent atelectasis or scarring. Lung volumes are low. Lungs otherwise clear. No convincing pleural effusion.  No pneumothorax. IMPRESSION: 1. No acute cardiopulmonary disease. Stable appearance from the exam obtained earlier today. Electronically Signed   By: Amie Portland M.D.   On: 01/30/2023 12:19   DG Ankle 2 Views Right Result Date: 01/30/2023 CLINICAL DATA:  Fall.  Right ankle deformity. EXAM: RIGHT ANKLE - 2 VIEW COMPARISON:  None Available. FINDINGS: There is diffuse osteopenia of the visualized osseous structures. There is disruption of the ankle mortise along with markedly displaced and comminuted fractures of the medial malleolus, posterior malleolus and lateral malleolus. No other acute fracture or aggressive osseous lesion. There is diffuse soft tissue swelling around the ankle joint. No radiopaque foreign bodies. IMPRESSION: *Markedly displaced trimalleolar fracture with complete disruption of ankle mortise. Electronically Signed   By: Jules Schick M.D.   On: 01/30/2023 10:26   CT Hip Right Wo Contrast Result Date: 01/30/2023 CLINICAL DATA:  Fall with right hip trauma.  Negative x-rays today. EXAM: CT OF THE RIGHT HIP WITHOUT CONTRAST TECHNIQUE: Multidetector CT imaging of the right hip was performed according to the standard protocol. Multiplanar CT image reconstructions were also generated. RADIATION DOSE REDUCTION: This exam was performed according to the departmental dose-optimization program which includes automated exposure control, adjustment of the mA and/or kV according to patient size and/or use of iterative reconstruction technique. COMPARISON:  AP pelvis and right hip series today, AP pelvis and right hip films 10/13/2021, CT abdomen pelvis 10/13/2021. FINDINGS: Bones/Joint/Cartilage There is osteopenia without evidence of fractures or other focal pathologic process. 8 mm bone island is chronically  noted right femoral neck and mild nonerosive right hip arthrosis. There is spurring of the pubic symphysis. Ligaments Suboptimally assessed by CT. Muscles and Tendons Muscle bulk is normal for age. No acute tendon abnormality is seen, no intramuscular hematoma or mass. Soft tissues No hematoma, mass or other significant findings. There is however, mild stranding along side the bladder. This was not seen previously and could indicate cystitis. Correlate clinically with urinalysis. IMPRESSION: 1. Osteopenia and degenerative change without evidence of fractures. 2. Mild stranding along side the bladder. This was not seen previously and could indicate cystitis. Correlate clinically with urinalysis. Electronically Signed   By: Almira Bar M.D.   On: 01/30/2023 06:32   DG Hip Unilat With Pelvis 2-3 Views Right Result Date: 01/30/2023 CLINICAL DATA:  Fall injury with right hip pain and right  lower extremity pain. EXAM: DG HIP (WITH OR WITHOUT PELVIS) 2-3V RIGHT; RIGHT KNEE - COMPLETE 4+ VIEW COMPARISON:  AP pelvis and AP and frog-leg right hip views 10/13/2021, no previous right knee series. FINDINGS: AP pelvis and AP and frog-leg right hip: There is osteopenia without evidence of fractures of the proximal right femur. No pelvic fracture or diastasis is seen. There is mild spurring of the SI joints and pubic symphysis, and moderate left and mild right nonerosive bilateral hip DJD. No other focal bone abnormality is seen. There are stippled opacities in the rectosigmoid colon most likely due to medication fragments. Advanced degenerative disc disease lower lumbar spine. Right knee, four views: There is no significant suprapatellar bursal fluid. There is osteopenia without evidence of fracture or dislocation. There is moderate to advanced tricompartmental nonerosive degenerative arthrosis. No other focal bone abnormality is seen. Soft tissues are unremarkable. IMPRESSION: 1. Osteopenia and degenerative change without  evidence of fractures of the pelvis, right hip or right knee. 2. Stippled opacities in the rectosigmoid colon most likely due to medication fragments. Electronically Signed   By: Almira Bar M.D.   On: 01/30/2023 04:51   DG Knee Complete 4 Views Right Result Date: 01/30/2023 CLINICAL DATA:  Fall injury with right hip pain and right lower extremity pain. EXAM: DG HIP (WITH OR WITHOUT PELVIS) 2-3V RIGHT; RIGHT KNEE - COMPLETE 4+ VIEW COMPARISON:  AP pelvis and AP and frog-leg right hip views 10/13/2021, no previous right knee series. FINDINGS: AP pelvis and AP and frog-leg right hip: There is osteopenia without evidence of fractures of the proximal right femur. No pelvic fracture or diastasis is seen. There is mild spurring of the SI joints and pubic symphysis, and moderate left and mild right nonerosive bilateral hip DJD. No other focal bone abnormality is seen. There are stippled opacities in the rectosigmoid colon most likely due to medication fragments. Advanced degenerative disc disease lower lumbar spine. Right knee, four views: There is no significant suprapatellar bursal fluid. There is osteopenia without evidence of fracture or dislocation. There is moderate to advanced tricompartmental nonerosive degenerative arthrosis. No other focal bone abnormality is seen. Soft tissues are unremarkable. IMPRESSION: 1. Osteopenia and degenerative change without evidence of fractures of the pelvis, right hip or right knee. 2. Stippled opacities in the rectosigmoid colon most likely due to medication fragments. Electronically Signed   By: Almira Bar M.D.   On: 01/30/2023 04:51   DG Chest 1 View Result Date: 01/30/2023 CLINICAL DATA:  Fall this morning with nausea EXAM: CHEST  1 VIEW COMPARISON:  07/21/2022 FINDINGS: Low volume chest with mild linear atelectasis or scarring. There is no edema, consolidation, effusion, or pneumothorax. Normal heart size and mediastinal contours for rotation. Remote left humeral  neck fracture with accentuated glenohumeral osteoarthritis. IMPRESSION: No acute finding. Electronically Signed   By: Tiburcio Pea M.D.   On: 01/30/2023 04:41   CT HEAD WO CONTRAST Result Date: 01/30/2023 CLINICAL DATA:  Head trauma, minor. EXAM: CT HEAD WITHOUT CONTRAST CT CERVICAL SPINE WITHOUT CONTRAST TECHNIQUE: Multidetector CT imaging of the head and cervical spine was performed following the standard protocol without intravenous contrast. Multiplanar CT image reconstructions of the cervical spine were also generated. RADIATION DOSE REDUCTION: This exam was performed according to the departmental dose-optimization program which includes automated exposure control, adjustment of the mA and/or kV according to patient size and/or use of iterative reconstruction technique. COMPARISON:  07/21/2022 FINDINGS: CT HEAD FINDINGS Brain: No evidence of acute infarction, hemorrhage, hydrocephalus,  extra-axial collection or mass lesion/mass effect. Vascular: No hyperdense vessel or unexpected calcification. Skull: Normal. Negative for fracture or focal lesion. Sinuses/Orbits: Mild, patchy mucosal thickening in the paranasal sinuses. CT CERVICAL SPINE FINDINGS Alignment: No traumatic malalignment Skull base and vertebrae: No acute fracture. No primary bone lesion or focal pathologic process. Soft tissues and spinal canal: Cystic density rightward of the T1-2 foramen is likely a root sleeve cysts. Extensive stylohyoid ligament ossification, incidental. Disc levels: Generalized degenerative endplate and especially facet spurring. Upper chest: No acute finding IMPRESSION: No evidence of acute intracranial or cervical spine injury. Electronically Signed   By: Tiburcio Pea M.D.   On: 01/30/2023 04:39   CT CERVICAL SPINE WO CONTRAST Result Date: 01/30/2023 CLINICAL DATA:  Head trauma, minor. EXAM: CT HEAD WITHOUT CONTRAST CT CERVICAL SPINE WITHOUT CONTRAST TECHNIQUE: Multidetector CT imaging of the head and cervical  spine was performed following the standard protocol without intravenous contrast. Multiplanar CT image reconstructions of the cervical spine were also generated. RADIATION DOSE REDUCTION: This exam was performed according to the departmental dose-optimization program which includes automated exposure control, adjustment of the mA and/or kV according to patient size and/or use of iterative reconstruction technique. COMPARISON:  07/21/2022 FINDINGS: CT HEAD FINDINGS Brain: No evidence of acute infarction, hemorrhage, hydrocephalus, extra-axial collection or mass lesion/mass effect. Vascular: No hyperdense vessel or unexpected calcification. Skull: Normal. Negative for fracture or focal lesion. Sinuses/Orbits: Mild, patchy mucosal thickening in the paranasal sinuses. CT CERVICAL SPINE FINDINGS Alignment: No traumatic malalignment Skull base and vertebrae: No acute fracture. No primary bone lesion or focal pathologic process. Soft tissues and spinal canal: Cystic density rightward of the T1-2 foramen is likely a root sleeve cysts. Extensive stylohyoid ligament ossification, incidental. Disc levels: Generalized degenerative endplate and especially facet spurring. Upper chest: No acute finding IMPRESSION: No evidence of acute intracranial or cervical spine injury. Electronically Signed   By: Tiburcio Pea M.D.   On: 01/30/2023 04:39    Microbiology: Results for orders placed or performed during the hospital encounter of 01/30/23  Resp panel by RT-PCR (RSV, Flu A&B, Covid) Anterior Nasal Swab     Status: None   Collection Time: 01/30/23 11:09 AM   Specimen: Anterior Nasal Swab  Result Value Ref Range Status   SARS Coronavirus 2 by RT PCR NEGATIVE NEGATIVE Final    Comment: (NOTE) SARS-CoV-2 target nucleic acids are NOT DETECTED.  The SARS-CoV-2 RNA is generally detectable in upper respiratory specimens during the acute phase of infection. The lowest concentration of SARS-CoV-2 viral copies this assay can  detect is 138 copies/mL. A negative result does not preclude SARS-Cov-2 infection and should not be used as the sole basis for treatment or other patient management decisions. A negative result may occur with  improper specimen collection/handling, submission of specimen other than nasopharyngeal swab, presence of viral mutation(s) within the areas targeted by this assay, and inadequate number of viral copies(<138 copies/mL). A negative result must be combined with clinical observations, patient history, and epidemiological information. The expected result is Negative.  Fact Sheet for Patients:  BloggerCourse.com  Fact Sheet for Healthcare Providers:  SeriousBroker.it  This test is no t yet approved or cleared by the [place] FDA and  has been authorized for detection and/or diagnosis of SARS-CoV-2 by FDA under an Emergency Use Authorization (EUA). This EUA will remain  in effect (meaning this test can be used) for the duration of the COVID-19 declaration under Section 564(b)(1) of the Act, 21 U.S.C.section 360bbb-3(b)(1), unless the  authorization is terminated  or revoked sooner.       Influenza A by PCR NEGATIVE NEGATIVE Final   Influenza B by PCR NEGATIVE NEGATIVE Final    Comment: (NOTE) The Xpert Xpress SARS-CoV-2/FLU/RSV plus assay is intended as an aid in the diagnosis of influenza from Nasopharyngeal swab specimens and should not be used as a sole basis for treatment. Nasal washings and aspirates are unacceptable for Xpert Xpress SARS-CoV-2/FLU/RSV testing.  Fact Sheet for Patients: BloggerCourse.com  Fact Sheet for Healthcare Providers: SeriousBroker.it  This test is not yet approved or cleared by the [place] FDA and has been authorized for detection and/or diagnosis of SARS-CoV-2 by FDA under an Emergency Use Authorization (EUA). This EUA will remain in  effect (meaning this test can be used) for the duration of the COVID-19 declaration under Section 564(b)(1) of the Act, 21 U.S.C. section 360bbb-3(b)(1), unless the authorization is terminated or revoked.     Resp Syncytial Virus by PCR NEGATIVE NEGATIVE Final    Comment: (NOTE) Fact Sheet for Patients: BloggerCourse.com  Fact Sheet for Healthcare Providers: SeriousBroker.it  This test is not yet approved or cleared by the [place] FDA and has been authorized for detection and/or diagnosis of SARS-CoV-2 by FDA under an Emergency Use Authorization (EUA). This EUA will remain in effect (meaning this test can be used) for the duration of the COVID-19 declaration under Section 564(b)(1) of the Act, 21 U.S.C. section 360bbb-3(b)(1), unless the authorization is terminated or revoked.  Performed at [facility], [address] [place], [place] [address]   Blood culture (routine x 2)     Status: None (Preliminary result)   Collection Time: 01/30/23 11:23 AM   Specimen: BLOOD RIGHT FOREARM  Result Value Ref Range Status   Specimen Description   Final    BLOOD RIGHT FOREARM Performed at [facility], [address] [place], [place] [address]    Special Requests   Final    BOTTLES DRAWN AEROBIC AND ANAEROBIC Blood Culture adequate volume Performed at [facility], [address] [place], [place] [address]    Culture   Final    NO GROWTH 3 DAYS Performed at [facility], [address], [place], [place] [address]    Report Status PENDING  Incomplete  Blood culture (routine x 2)     Status: None (Preliminary result)   Collection Time: 01/30/23 11:31 AM   Specimen: BLOOD  Result Value Ref Range Status   Specimen Description   Final    BLOOD RIGHT ANTECUBITAL Performed at [facility], [address], [place], [place] [address]    Special Requests   Final     BOTTLES DRAWN AEROBIC AND ANAEROBIC Blood Culture adequate volume Performed at [facility], [address] [place], [place] [address]    Culture   Final    NO GROWTH 3 DAYS Performed at [facility], [address], [place], [place] [address]    Report Status PENDING  Incomplete  Respiratory (~20 pathogens) panel by PCR     Status: Abnormal   Collection Time: 01/30/23  1:52 PM   Specimen: Nasopharyngeal Swab; Respiratory  Result Value Ref Range Status   Adenovirus NOT DETECTED NOT DETECTED Final   Coronavirus 229E NOT DETECTED NOT DETECTED Final    Comment: (NOTE) The Coronavirus on the Respiratory Panel, DOES NOT test for the novel  Coronavirus (2019 nCoV)    Coronavirus HKU1 NOT  DETECTED NOT DETECTED Final   Coronavirus NL63 DETECTED (A) NOT DETECTED Final   Coronavirus OC43 NOT DETECTED NOT DETECTED Final   Metapneumovirus NOT DETECTED NOT DETECTED Final   Rhinovirus / Enterovirus NOT DETECTED NOT DETECTED Final   Influenza A NOT DETECTED NOT DETECTED Final   Influenza B NOT DETECTED NOT DETECTED Final   Parainfluenza Virus 1 NOT DETECTED NOT DETECTED Final   Parainfluenza Virus 2 NOT DETECTED NOT DETECTED Final   Parainfluenza Virus 3 NOT DETECTED NOT DETECTED Final   Parainfluenza Virus 4 NOT DETECTED NOT DETECTED Final   Respiratory Syncytial Virus NOT DETECTED NOT DETECTED Final   Bordetella pertussis NOT DETECTED NOT DETECTED Final   Bordetella Parapertussis NOT DETECTED NOT DETECTED Final   Chlamydophila pneumoniae NOT DETECTED NOT DETECTED Final   Mycoplasma pneumoniae NOT DETECTED NOT DETECTED Final    Comment: Performed at [facility], [address], [place], [place] [address]  MRSA Next Gen by PCR, Nasal     Status: None   Collection Time: 01/31/23  9:47 AM   Specimen: Nasal Mucosa; Nasal Swab  Result Value Ref Range Status   MRSA by PCR Next Gen NOT DETECTED NOT DETECTED Final    Comment: (NOTE) The GeneXpert MRSA Assay (FDA  approved for NASAL specimens only), is one component of a comprehensive MRSA colonization surveillance program. It is not intended to diagnose MRSA infection nor to guide or monitor treatment for MRSA infections. Test performance is not FDA approved in patients less than 47 years old. Performed at [facility], [address] [place], [place] [address]    Labs: CBC: Recent Labs  Lab 01/30/23 [phone] 01/30/23 1123 01/31/23 0322 02/01/23 0351 02/02/23 0333 02/03/23 0351  WBC 13.5* 11.3* 7.5 6.1 5.1 5.5  NEUTROABS 12.5* 10.7*  --   --   --   --   HGB 15.3* 14.0 11.4* 10.6* 11.0* 10.6*  HCT 45.4 40.1 34.2* 31.2* 33.0* 32.2*  MCV 89.9 88.3 91.2 91.0 91.4 92.0  PLT 174 181 161 138* 142* 172   Basic Metabolic Panel: Recent Labs  Lab 01/30/23 0336 01/31/23 0322 02/01/23 0351 02/02/23 0333 02/03/23 0351  NA 139 132* 135 137 134*  K 4.6 3.9 3.6 3.7 3.6  CL 103 100 103 102 99  CO2 26 25 26 29 29   GLUCOSE 142* 152* 98 93 95  BUN 15 20 18 13 16   CREATININE 0.97 0.78 0.70 0.70 0.63  CALCIUM 9.4 8.0* 7.8* 8.1* 7.8*  MG  --   --  2.0 1.9 2.0   Liver Function Tests: Recent Labs  Lab 01/31/23 0322  AST 21  ALT 12  ALKPHOS 38  BILITOT 0.2  PROT 5.3*  ALBUMIN 3.1*   CBG: Recent Labs  Lab 02/01/23 0745  GLUCAP 105*    Discharge time spent: greater than 30 minutes.  Author: Lynden Oxford, MD  [facility]

## 2023-02-03 NOTE — TOC Progression Note (Addendum)
Transition of Care [facility]) - Progression Note    Patient Details  Name: Erika Conley MRN: [id number] Date of Birth: Aug 13, 1934  Transition of Care [facility]) CM/SW Contact  Georgie Chard, LCSW Phone Number: 02/03/2023, 12:32 PM  Clinical Narrative:    CSW has attempted to reach out to [facility] intake as well called the main facility. At this time there has been N/A with reaching anyone about patient being DC to facility. CSW will continue to reach out.    Addend@ 1:08 pm  This CSW has spoke to [employer] from [place] at this time the patient is able to come to facility. This CSW also attempted to call son N/A leftt HIPAA VM. Report # was provided to the RN [phone] room 205. This CSW has called [facility] for the patient. Transport will arrive shortly. AT this time there are no further needs.   Addend @ 1:19  Patient's son has returned CSW call. Son is aware of patient's DC.   Expected Discharge Plan: Skilled Nursing Facility Barriers to Discharge: English as a second language teacher, SNF Pending bed offer  Expected Discharge Plan and Services In-house Referral: Clinical Social Work   Post Acute Care Choice: Skilled Nursing Facility Living arrangements for the past 2 months: Assisted Living Facility ([facility]) Expected Discharge Date: 02/03/23                                     Social Determinants of Health (SDOH) Interventions SDOH Screenings   Food Insecurity: No Food Insecurity (01/31/2023)  Housing: Patient Declined (01/31/2023)  Transportation Needs: No Transportation Needs (01/31/2023)  Utilities: Not At Risk (01/31/2023)  Tobacco Use: Low Risk  (01/30/2023)    Readmission Risk Interventions    02/01/2023   12:43 PM  Readmission Risk Prevention Plan  Transportation Screening Complete  PCP or Specialist Appt within 5-7 Days Complete  Home Care Screening Complete  Medication Review (RN CM) Complete

## 2023-02-03 NOTE — Plan of Care (Signed)
  Problem: Activity: Goal: Risk for activity intolerance will decrease Outcome: Progressing   Problem: Coping: Goal: Level of anxiety will decrease Outcome: Progressing   Problem: Pain Management: Goal: General experience of comfort will improve Outcome: Progressing

## 2023-02-03 NOTE — Plan of Care (Signed)
  Problem: Education: Goal: Knowledge of General Education information will improve Description: Including pain rating scale, medication(s)/side effects and non-pharmacologic comfort measures Outcome: Progressing   Problem: Health Behavior/Discharge Planning: Goal: Ability to manage health-related needs will improve Outcome: Not Progressing   Problem: Clinical Measurements: Goal: Ability to maintain clinical measurements within normal limits will improve Outcome: Progressing Goal: Will remain free from infection Outcome: Progressing Goal: Diagnostic test results will improve Outcome: Progressing Goal: Respiratory complications will improve Outcome: Progressing Goal: Cardiovascular complication will be avoided Outcome: Progressing   Problem: Activity: Goal: Risk for activity intolerance will decrease Outcome: Progressing   Problem: Nutrition: Goal: Adequate nutrition will be maintained Outcome: Progressing   Problem: Coping: Goal: Level of anxiety will decrease Outcome: Progressing   Problem: Elimination: Goal: Will not experience complications related to bowel motility Outcome: Progressing Goal: Will not experience complications related to urinary retention Outcome: Progressing   Problem: Pain Management: Goal: General experience of comfort will improve Outcome: Progressing   Problem: Safety: Goal: Ability to remain free from injury will improve Outcome: Progressing

## 2023-02-04 LAB — CULTURE, BLOOD (ROUTINE X 2)
Culture: NO GROWTH
Culture: NO GROWTH
Special Requests: ADEQUATE
Special Requests: ADEQUATE

## 2023-10-08 ENCOUNTER — Encounter (HOSPITAL_COMMUNITY): Payer: Self-pay | Admitting: Emergency Medicine

## 2023-10-08 ENCOUNTER — Inpatient Hospital Stay (HOSPITAL_COMMUNITY)
Admission: EM | Admit: 2023-10-08 | Discharge: 2023-10-16 | DRG: 377 | Disposition: A | Discharge status: Skilled Nursing Facility | Payer: Medicare (Managed Care) | Source: Skilled Nursing Facility | Attending: Internal Medicine | Admitting: Internal Medicine

## 2023-10-08 ENCOUNTER — Other Ambulatory Visit: Payer: Self-pay

## 2023-10-08 DIAGNOSIS — I10 Essential (primary) hypertension: Secondary | ICD-10-CM | POA: Diagnosis present

## 2023-10-08 DIAGNOSIS — R000 Tachycardia, unspecified: Secondary | ICD-10-CM | POA: Diagnosis present

## 2023-10-08 DIAGNOSIS — K219 Gastro-esophageal reflux disease without esophagitis: Secondary | ICD-10-CM | POA: Diagnosis present

## 2023-10-08 DIAGNOSIS — H548 Legal blindness, as defined in USA: Secondary | ICD-10-CM | POA: Diagnosis present

## 2023-10-08 DIAGNOSIS — E871 Hypo-osmolality and hyponatremia: Secondary | ICD-10-CM | POA: Diagnosis present

## 2023-10-08 DIAGNOSIS — Z90710 Acquired absence of both cervix and uterus: Secondary | ICD-10-CM

## 2023-10-08 DIAGNOSIS — Z66 Do not resuscitate: Secondary | ICD-10-CM | POA: Diagnosis present

## 2023-10-08 DIAGNOSIS — Z1152 Encounter for screening for COVID-19: Secondary | ICD-10-CM

## 2023-10-08 DIAGNOSIS — Z8249 Family history of ischemic heart disease and other diseases of the circulatory system: Secondary | ICD-10-CM

## 2023-10-08 DIAGNOSIS — E039 Hypothyroidism, unspecified: Secondary | ICD-10-CM | POA: Diagnosis present

## 2023-10-08 DIAGNOSIS — Z91018 Allergy to other foods: Secondary | ICD-10-CM

## 2023-10-08 DIAGNOSIS — Z9049 Acquired absence of other specified parts of digestive tract: Secondary | ICD-10-CM

## 2023-10-08 DIAGNOSIS — D62 Acute posthemorrhagic anemia: Secondary | ICD-10-CM | POA: Diagnosis present

## 2023-10-08 DIAGNOSIS — F0393 Unspecified dementia, unspecified severity, with mood disturbance: Secondary | ICD-10-CM | POA: Diagnosis present

## 2023-10-08 DIAGNOSIS — K3189 Other diseases of stomach and duodenum: Secondary | ICD-10-CM | POA: Diagnosis present

## 2023-10-08 DIAGNOSIS — K3182 Dieulafoy lesion (hemorrhagic) of stomach and duodenum: Principal | ICD-10-CM | POA: Diagnosis present

## 2023-10-08 DIAGNOSIS — K2289 Other specified disease of esophagus: Secondary | ICD-10-CM | POA: Diagnosis present

## 2023-10-08 DIAGNOSIS — N390 Urinary tract infection, site not specified: Secondary | ICD-10-CM

## 2023-10-08 DIAGNOSIS — M797 Fibromyalgia: Secondary | ICD-10-CM | POA: Diagnosis present

## 2023-10-08 DIAGNOSIS — I951 Orthostatic hypotension: Secondary | ICD-10-CM | POA: Diagnosis not present

## 2023-10-08 DIAGNOSIS — Z888 Allergy status to other drugs, medicaments and biological substances status: Secondary | ICD-10-CM

## 2023-10-08 DIAGNOSIS — K920 Hematemesis: Principal | ICD-10-CM

## 2023-10-08 DIAGNOSIS — Z885 Allergy status to narcotic agent status: Secondary | ICD-10-CM

## 2023-10-08 DIAGNOSIS — W19XXXA Unspecified fall, initial encounter: Secondary | ICD-10-CM | POA: Diagnosis present

## 2023-10-08 DIAGNOSIS — K922 Gastrointestinal hemorrhage, unspecified: Secondary | ICD-10-CM | POA: Diagnosis present

## 2023-10-08 DIAGNOSIS — J189 Pneumonia, unspecified organism: Secondary | ICD-10-CM | POA: Diagnosis present

## 2023-10-08 DIAGNOSIS — Z79899 Other long term (current) drug therapy: Secondary | ICD-10-CM

## 2023-10-08 DIAGNOSIS — D519 Vitamin B12 deficiency anemia, unspecified: Secondary | ICD-10-CM | POA: Diagnosis present

## 2023-10-08 DIAGNOSIS — R5381 Other malaise: Secondary | ICD-10-CM | POA: Diagnosis present

## 2023-10-08 DIAGNOSIS — D500 Iron deficiency anemia secondary to blood loss (chronic): Secondary | ICD-10-CM | POA: Insufficient documentation

## 2023-10-08 DIAGNOSIS — K449 Diaphragmatic hernia without obstruction or gangrene: Secondary | ICD-10-CM | POA: Diagnosis present

## 2023-10-08 DIAGNOSIS — F0390 Unspecified dementia without behavioral disturbance: Secondary | ICD-10-CM | POA: Diagnosis present

## 2023-10-08 DIAGNOSIS — K222 Esophageal obstruction: Secondary | ICD-10-CM | POA: Diagnosis present

## 2023-10-08 DIAGNOSIS — M1990 Unspecified osteoarthritis, unspecified site: Secondary | ICD-10-CM | POA: Diagnosis present

## 2023-10-08 DIAGNOSIS — K580 Irritable bowel syndrome with diarrhea: Secondary | ICD-10-CM | POA: Diagnosis present

## 2023-10-08 DIAGNOSIS — J690 Pneumonitis due to inhalation of food and vomit: Secondary | ICD-10-CM

## 2023-10-08 DIAGNOSIS — Z79890 Hormone replacement therapy (postmenopausal): Secondary | ICD-10-CM

## 2023-10-08 DIAGNOSIS — F32A Depression, unspecified: Secondary | ICD-10-CM | POA: Diagnosis present

## 2023-10-08 LAB — CBC WITH DIFFERENTIAL/PLATELET
Abs Immature Granulocytes: 0.02 K/uL (ref 0.00–0.07)
Basophils Absolute: 0.1 K/uL (ref 0.0–0.1)
Basophils Relative: 1 %
Eosinophils Absolute: 0 K/uL (ref 0.0–0.5)
Eosinophils Relative: 0 %
HCT: 33.9 % — ABNORMAL LOW (ref 36.0–46.0)
Hemoglobin: 11.1 g/dL — ABNORMAL LOW (ref 12.0–15.0)
Immature Granulocytes: 0 %
Lymphocytes Relative: 20 %
Lymphs Abs: 1.4 K/uL (ref 0.7–4.0)
MCH: 29.9 pg (ref 26.0–34.0)
MCHC: 32.7 g/dL (ref 30.0–36.0)
MCV: 91.4 fL (ref 80.0–100.0)
Monocytes Absolute: 0.5 K/uL (ref 0.1–1.0)
Monocytes Relative: 7 %
Neutro Abs: 5.1 K/uL (ref 1.7–7.7)
Neutrophils Relative %: 72 %
Platelets: 200 K/uL (ref 150–400)
RBC: 3.71 MIL/uL — ABNORMAL LOW (ref 3.87–5.11)
RDW: 13.2 % (ref 11.5–15.5)
WBC: 7.1 K/uL (ref 4.0–10.5)
nRBC: 0 % (ref 0.0–0.2)

## 2023-10-08 LAB — COMPREHENSIVE METABOLIC PANEL WITH GFR
ALT: 10 U/L (ref 0–44)
AST: 18 U/L (ref 15–41)
Albumin: 3.3 g/dL — ABNORMAL LOW (ref 3.5–5.0)
Alkaline Phosphatase: 55 U/L (ref 38–126)
Anion gap: 8 (ref 5–15)
BUN: 33 mg/dL — ABNORMAL HIGH (ref 8–23)
CO2: 24 mmol/L (ref 22–32)
Calcium: 7.9 mg/dL — ABNORMAL LOW (ref 8.9–10.3)
Chloride: 99 mmol/L (ref 98–111)
Creatinine, Ser: 0.84 mg/dL (ref 0.44–1.00)
GFR, Estimated: >60 mL/min (ref >60)
Glucose, Bld: 171 mg/dL — ABNORMAL HIGH (ref 70–99)
Potassium: 4 mmol/L (ref 3.5–5.1)
Sodium: 131 mmol/L — ABNORMAL LOW (ref 135–145)
Total Bilirubin: 0.5 mg/dL (ref 0.0–1.2)
Total Protein: 5.5 g/dL — ABNORMAL LOW (ref 6.5–8.1)

## 2023-10-08 LAB — TYPE AND SCREEN
ABO/RH(D): A NEG
Antibody Screen: NEGATIVE

## 2023-10-08 LAB — SALICYLATE LEVEL: Salicylate Lvl: <7 mg/dL — ABNORMAL LOW (ref 7.0–30.0)

## 2023-10-08 LAB — LIPASE, BLOOD: Lipase: 57 U/L — ABNORMAL HIGH (ref 11–51)

## 2023-10-08 MED ORDER — LACTATED RINGERS IV BOLUS
1000.0000 mL | Freq: Once | INTRAVENOUS | Status: AC
  Administered 2023-10-08: 1000 mL via INTRAVENOUS

## 2023-10-08 MED ORDER — ONDANSETRON HCL 4 MG/2ML IJ SOLN
4.0000 mg | Freq: Once | INTRAMUSCULAR | Status: AC
  Administered 2023-10-08: 4 mg via INTRAVENOUS
  Filled 2023-10-08: qty 2

## 2023-10-08 NOTE — ED Provider Notes (Addendum)
 [place] EMERGENCY DEPARTMENT AT [facility] Provider Note   CSN: [id number] Arrival date & time: 10/08/23  2141     Patient presents with: Hematemesis   Erika Conley is a 89 y.o. female has medical history significant for GERD, pancreatitis, hypothyroidism, and orthostatic hypotension presents today for hematemesis and dizziness after dinner.  Patient states that she felt dizzy while using the toilet.  Patient denies fall, LOC, blood thinners, fever, chills, chest pain, abdominal pain, or shortness of breath.  Per EMS patient has had large quantities of emesis at her home and 2 prior to arrival.  Patient received Zofran  which improved her symptoms.  Patient also endorses headache secondary to vomiting.  Patient placed on 4 L nasal cannula after satting 88% on room air.   HPI     Prior to Admission medications   Medication Sig Start Date End Date Taking? Authorizing Provider  acetaminophen  (TYLENOL ) 325 MG tablet Take 1 tablet (325 mg total) by mouth every 4 (four) hours as needed for headache. Patient taking differently: Take 325-650 mg by mouth as directed. Take 650 mg BID & Take 325 mg every 8 hours PRN for headache 04/18/22  Yes Lue Elsie BROCKS, MD  amLODipine  (NORVASC ) 10 MG tablet Take 1 tablet (10 mg total) by mouth daily. 04/19/22  Yes Lue Elsie BROCKS, MD  busPIRone  (BUSPAR ) 10 MG tablet Take 20 mg by mouth 2 (two) times daily.   Yes [provider]  calcium  carbonate (TUMS - DOSED IN MG ELEMENTAL CALCIUM ) 500 MG chewable tablet Chew 2-3 tablets by mouth as directed. Give 1 tablet by mouth before meals & Give 2 tablets by mouth PRN for TID with meals   Yes [provider]  Carboxymethylcellul-Glycerin (REFRESH OPTIVE OP) Place 1 drop into both eyes See admin instructions. Instill 1 drop into one or both eyes up to 6 times a day as needed for dryness   Yes [provider]  cetirizine (ZYRTEC) 10 MG tablet Take 10 mg by mouth daily  as needed for allergies.   Yes [provider]  diclofenac  Sodium (VOLTAREN ) 1 % GEL Apply 2 g topically 2 (two) times daily as needed (for back pain).   Yes [provider]  dicyclomine  (BENTYL ) 20 MG tablet Take 1 tablet (20 mg total) by mouth 3 (three) times daily before meals. 02/03/23  Yes Tobie Yetta HERO, MD  donepezil  (ARICEPT ) 10 MG tablet Take 10 mg by mouth at bedtime.   Yes [provider]  famotidine  (PEPCID ) 20 MG tablet Take 20 mg by mouth 2 (two) times daily.   Yes [provider]  fluticasone  (FLONASE ) 50 MCG/ACT nasal spray Place 1 spray into both nostrils in the morning and at bedtime.   Yes [provider]  HYDROcodone -acetaminophen  (NORCO/VICODIN) 5-325 MG tablet Take 1 tablet by mouth every 6 (six) hours as needed for moderate pain (pain score 4-6) or severe pain (pain score 7-10). 02/03/23 02/03/24 Yes Tobie Yetta HERO, MD  levothyroxine  (SYNTHROID ) 100 MCG tablet Take 100 mcg by mouth daily before breakfast.   Yes [provider]  loperamide  (IMODIUM  A-D) 2 MG tablet Take 2-4 mg by mouth See admin instructions. Take 4 mg by mouth after the 1st loose stool movement and an additional 2 mg after each loose stool (CANNOT EXCEED 16 MG/24 HOURS)   Yes [provider]  LORazepam  (ATIVAN ) 0.5 MG tablet Take 0.5 mg by mouth at 5 PM every day Patient taking differently: Take 0.25-0.5 mg  by mouth 2 (two) times daily. Take 0.25 mg by mouth in the morning & Take 0.5 mg by mouth in the evening 02/03/23  Yes Tobie Yetta HERO, MD  Menthol , Topical Analgesic, (BIOFREEZE COOL THE PAIN) 4 % GEL Apply 1 application  topically daily.   Yes [provider]  Multiple Vitamins-Minerals (HEALTHY EYES SUPERVISION 2 PO) Take 1 capsule by mouth 2 (two) times daily.   Yes [provider]  omeprazole  (PRILOSEC) 40 MG capsule Take 1 capsule (40 mg total) by mouth 2 (two) times daily. Take shortly before breakfast and dinner  meal Patient taking differently: Take 40 mg by mouth daily. 07/16/18  Yes Teressa Toribio SQUIBB, MD  promethazine  (PHENERGAN ) 25 MG tablet Take 25 mg by mouth every 4 (four) hours as needed for nausea or vomiting.   Yes [provider]  senna-docusate (SENOKOT-S) 8.6-50 MG tablet Take 1 tablet by mouth at bedtime as needed for mild constipation. 04/18/22  Yes Lue Elsie BROCKS, MD  sertraline  (ZOLOFT ) 100 MG tablet Take 200 mg by mouth at bedtime.   Yes [provider]  traZODone  (DESYREL ) 150 MG tablet Take 150 mg by mouth at bedtime.   Yes [provider]  hydrOXYzine  (ATARAX ) 10 MG tablet Take 1 tablet (10 mg total) by mouth every 6 (six) hours as needed for itching. Patient not taking: Reported on 10/09/2023 05/20/22   Harris, Abigail, PA-C    Allergies: Beta sitosterol [sitosterols], Blueberry [vaccinium angustifolium], Chocolate, Codeine, Cortisone, Guggulipid-black pepper, Morphine , Nsaids, and Other    Review of Systems  Gastrointestinal:  Positive for nausea and vomiting.  Neurological:  Positive for dizziness and headaches.    Updated Vital Signs BP (!) 124/59   Pulse 72   Temp 98.3 F (36.8 C) (Oral)   Resp 19   Ht 5' 3 (1.6 m)   Wt 55.2 kg   SpO2 100%   BMI 21.56 kg/m   Physical Exam Vitals and nursing note reviewed.  Constitutional:      General: She is not in acute distress.    Appearance: She is well-developed. She is not toxic-appearing.  HENT:     Head: Normocephalic and atraumatic.     Right Ear: External ear normal.     Left Ear: External ear normal.     Mouth/Throat:     Mouth: Mucous membranes are moist.     Pharynx: Oropharynx is clear.  Eyes:     Extraocular Movements: Extraocular movements intact.     Conjunctiva/sclera: Conjunctivae normal.  Cardiovascular:     Rate and Rhythm: Normal rate and regular rhythm.     Pulses: Normal pulses.     Heart sounds: Normal heart sounds. No murmur heard. Pulmonary:     Effort:  Pulmonary effort is normal. No respiratory distress.     Breath sounds: Normal breath sounds.  Abdominal:     General: There is no distension.     Palpations: Abdomen is soft.     Tenderness: There is no abdominal tenderness.  Musculoskeletal:        General: No swelling or tenderness.     Cervical back: Normal range of motion and neck supple.  Skin:    General: Skin is warm and dry.     Capillary Refill: Capillary refill takes less than 2 seconds.     Coloration: Skin is pale.  Neurological:     General: No focal deficit present.     Mental Status: She is alert and oriented to person, place,  and time.  Psychiatric:        Mood and Affect: Mood normal.     (all labs ordered are listed, but only abnormal results are displayed) Labs Reviewed  COMPREHENSIVE METABOLIC PANEL WITH GFR - Abnormal; Notable for the following components:      Result Value   Sodium 131 (*)    Glucose, Bld 171 (*)    BUN 33 (*)    Calcium  7.9 (*)    Total Protein 5.5 (*)    Albumin 3.3 (*)    All other components within normal limits  CBC WITH DIFFERENTIAL/PLATELET - Abnormal; Notable for the following components:   RBC 3.71 (*)    Hemoglobin 11.1 (*)    HCT 33.9 (*)    All other components within normal limits  LIPASE, BLOOD - Abnormal; Notable for the following components:   Lipase 57 (*)    All other components within normal limits  SALICYLATE LEVEL - Abnormal; Notable for the following components:   Salicylate Lvl <7.0 (*)    All other components within normal limits  HEMOGLOBIN AND HEMATOCRIT, BLOOD - Abnormal; Notable for the following components:   Hemoglobin 9.5 (*)    HCT 28.4 (*)    All other components within normal limits  PROTIME-INR  URINALYSIS, ROUTINE W REFLEX MICROSCOPIC  TYPE AND SCREEN    EKG: EKG Interpretation Date/Time:  Monday October 08 2023 21:57:17 EDT Ventricular Rate:  68 PR Interval:  180 QRS Duration:  89 QT Interval:  420 QTC Calculation: 447 R  Axis:   54  Text Interpretation: Sinus rhythm Confirmed by Melvenia Motto [phone]) on 10/09/2023 12:37:26 AM  Radiology: DG Chest Portable 1 View Result Date: 10/09/2023 CLINICAL DATA:  Hypoxia EXAM: PORTABLE CHEST 1 VIEW COMPARISON:  01/30/2023 FINDINGS: Low lung volumes. Heterogeneous airspace disease at left base suspicious for pneumonia. No pleural effusion. Normal cardiac size with aortic atherosclerosis. Old fracture deformity of the proximal left humerus IMPRESSION: Heterogeneous airspace disease at the left base suspicious for pneumonia. Electronically Signed   By: Luke Bun M.D.   On: 10/09/2023 02:11     Procedures   Medications Ordered in the ED  lactated ringers  bolus 1,000 mL (1,000 mLs Intravenous Not Given 10/09/23 0041)  lactated ringers  bolus 1,000 mL (0 mLs Intravenous Stopped 10/09/23 0020)  ondansetron  (ZOFRAN ) injection 4 mg (4 mg Intravenous Given 10/08/23 2326)  pantoprazole  (PROTONIX ) injection 40 mg (40 mg Intravenous Given 10/09/23 0044)  lactated ringers  bolus 1,000 mL (0 mLs Intravenous Stopped 10/09/23 0301)  cefTRIAXone  (ROCEPHIN ) 1 g in sodium chloride  0.9 % 100 mL IVPB (0 g Intravenous Stopped 10/09/23 0334)  azithromycin  (ZITHROMAX ) 500 mg in sodium chloride  0.9 % 250 mL IVPB (0 mg Intravenous Stopped 10/09/23 0401)                                    Medical Decision Making Amount and/or Complexity of Data Reviewed Labs: ordered. Radiology: ordered.  Risk Prescription drug management. Decision regarding hospitalization.   This patient presents to the ED for concern of hematemesis and dizziness, this involves an extensive number of treatment options, and is a complaint that carries with it a high risk of complications and morbidity.  The differential diagnosis includes Mallory-Weiss tear, upper GI bleed, vertigo, orthostatic hypotension, blood loss anemia   Co morbidities / Chronic conditions that complicate the patient evaluation  GERD, pancreatitis,  hypothyroidism, and orthostatic hypotension  Additional history obtained:  Additional history obtained from EMR External records from outside source obtained and reviewed including Care Everywhere   Lab Tests:  I Ordered, and personally interpreted labs.  The pertinent results include: Anemia 11.1 which is chronic per historical values, hyponatremia 131, elevated bun at 33, elevated lipase at 57, pro time and INR unremarkable Repeat H&H showed a drop in hemoglobin to 9.5 from 11.1   Imaging: Chest x-ray which showed heterogenous airspace disease of the left base suspicious for pneumonia  Cardiac Monitoring: / EKG:  The patient was maintained on a cardiac monitor.  I personally viewed and interpreted the cardiac monitored which showed an underlying rhythm of: Sinus rhythm   Problem List / ED Course / Critical interventions / Medication management I ordered medication including IVF, Rocephin , azithromycin  Reevaluation of the patient after these medicines showed that the patient patient still orthostatic I have reviewed the patients home medicines and have made adjustments as needed   Consultations Obtained:  Consulted hospitalist, Dr. Alfornia she is agreeable to admission for hematemesis and orthostasis Consulted gastroenterology, Dr. Albertus who is agreeable to see the patient while admitted and did not feel that any additional intervention needed to be done at this time when informed about the drop in hemoglobin.  Test / Admission - Considered:  Patient reported dizziness and then had a syncopal episode after standing for approximately 30 to 40 seconds during orthostatic vital signs.     Final diagnoses:  Hematemesis, unspecified whether nausea present  Orthostatic hypotension  Aspiration pneumonia, unspecified aspiration pneumonia type, unspecified laterality, unspecified part of lung [facility])    ED Discharge Orders     None          Francis Ileana SAILOR, PA-C 10/09/23  0229    Francis Ileana SAILOR, PA-C 10/09/23 0453    Mannie Pac T, DO 10/14/23 1545

## 2023-10-08 NOTE — ED Triage Notes (Signed)
 Pt presents to the ED via [facility] with complaints of hematemesis and dizziness starting tonight after dinner. Pt states she felt dizzy while using the toilet, denies hitting her head, nor LOC, not on thinners. Pt with large quantities of emesis per EMS at her residence and had two episodes PTA. Pt received 4mg  Zofran  PTA which has improved her sx. Endorses a headache from the vomiting. A&Ox4 at this time. Denies CP or SOB.

## 2023-10-08 NOTE — ED Provider Notes (Incomplete)
 [place] EMERGENCY DEPARTMENT AT [facility] Provider Note   CSN: [id number] Arrival date & time: 10/08/23  2141     Patient presents with: Hematemesis   Erika Conley is a 89 y.o. female has medical history significant for GERD, pancreatitis, hypothyroidism, and orthostatic hypotension presents today for hematemesis and dizziness after dinner.  Patient states that she felt dizzy while using the toilet.  Patient denies fall, LOC, blood thinners, fever, chills, chest pain, abdominal pain, or shortness of breath.  Per EMS patient has had large quantities of emesis at her home and 2 prior to arrival.  Patient received Zofran  which improved her symptoms.  Patient also endorses headache secondary to vomiting.  Patient placed on 4 L nasal cannula after satting 88% on room air.  {Add pertinent medical, surgical, social history, OB history to HPI:32947} HPI     Prior to Admission medications   Medication Sig Start Date End Date Taking? Authorizing Provider  acetaminophen  (TYLENOL ) 325 MG tablet Take 1 tablet (325 mg total) by mouth every 4 (four) hours as needed for headache. Patient taking differently: Take 650 mg by mouth every 4 (four) hours as needed (for back pain). 04/18/22   Lue Elsie BROCKS, MD  amLODipine  (NORVASC ) 10 MG tablet Take 1 tablet (10 mg total) by mouth daily. 04/19/22   Lue Elsie BROCKS, MD  busPIRone  (BUSPAR ) 30 MG tablet Take 30 mg by mouth in the morning and at bedtime.    [provider]  calcium  carbonate (TUMS - DOSED IN MG ELEMENTAL CALCIUM ) 500 MG chewable tablet Chew 2-3 tablets by mouth 3 (three) times daily with meals as needed (for GERD).    [provider]  Carboxymethylcellul-Glycerin (REFRESH OPTIVE OP) Place 1 drop into both eyes See admin instructions. Instill 1 drop into one or both eyes up to 6 times a day as needed for dryness    [provider]  cetirizine (ZYRTEC) 10 MG tablet Take 10 mg by mouth daily.     [provider]  diclofenac  Sodium (VOLTAREN ) 1 % GEL Apply 2 g topically 2 (two) times daily as needed (for back pain).    [provider]  dicyclomine  (BENTYL ) 20 MG tablet Take 1 tablet (20 mg total) by mouth 3 (three) times daily before meals. 02/03/23   Tobie Yetta HERO, MD  donepezil  (ARICEPT ) 10 MG tablet Take 10 mg by mouth at bedtime.    [provider]  famotidine  (PEPCID ) 20 MG tablet Take 20 mg by mouth 2 (two) times daily.    [provider]  fluticasone  (FLONASE ) 50 MCG/ACT nasal spray Place 1 spray into both nostrils in the morning and at bedtime.    [provider]  HYDROcodone -acetaminophen  (NORCO/VICODIN) 5-325 MG tablet Take 1 tablet by mouth every 6 (six) hours as needed for moderate pain (pain score 4-6) or severe pain (pain score 7-10). 02/03/23 02/03/24  Tobie Yetta HERO, MD  hydrOXYzine  (ATARAX ) 10 MG tablet Take 1 tablet (10 mg total) by mouth every 6 (six) hours as needed for itching. 05/20/22   Harris, Abigail, PA-C  levothyroxine  (SYNTHROID ) 100 MCG tablet Take 100 mcg by mouth daily before breakfast.    [provider]  loperamide  (IMODIUM  A-D) 2 MG tablet Take 2-4 mg by mouth See admin instructions. Take 4 mg by mouth after the 1st loose stool movement and an additional 2 mg after each loose stool (CANNOT EXCEED 16 MG/24 HOURS)    [provider]  LORazepam  (ATIVAN ) 0.5  MG tablet Take 0.5 mg by mouth at 5 PM every day 02/03/23   Patel, Pranav M, MD  Multiple Vitamins-Minerals (HEALTHY EYES SUPERVISION 2 PO) Take 1 capsule by mouth 2 (two) times daily.    [provider]  omeprazole  (PRILOSEC) 40 MG capsule Take 1 capsule (40 mg total) by mouth 2 (two) times daily. Take shortly before breakfast and dinner meal Patient taking differently: Take 40 mg by mouth See admin instructions. Take 40 mg by mouth before breakfast and supper 07/16/18   Teressa Toribio SQUIBB, MD  senna-docusate (SENOKOT-S) 8.6-50 MG tablet  Take 1 tablet by mouth at bedtime as needed for mild constipation. 04/18/22   Lue Elsie BROCKS, MD  sertraline  (ZOLOFT ) 100 MG tablet Take 200 mg by mouth in the morning.    [provider]  traZODone  (DESYREL ) 150 MG tablet Take 150 mg by mouth at bedtime.    [provider]    Allergies: Beta sitosterol [sitosterols], Blueberry [vaccinium angustifolium], Chocolate, Codeine, Cortisone, Guggulipid-black pepper, Morphine , Nsaids, and Other    Review of Systems  Gastrointestinal:  Positive for nausea and vomiting.  Neurological:  Positive for dizziness and headaches.    Updated Vital Signs Ht 5' 3 (1.6 m)   Wt 55.2 kg   BMI 21.56 kg/m   Physical Exam Vitals and nursing note reviewed.  Constitutional:      General: She is not in acute distress.    Appearance: She is well-developed. She is not toxic-appearing.  HENT:     Head: Normocephalic and atraumatic.     Right Ear: External ear normal.     Left Ear: External ear normal.     Mouth/Throat:     Mouth: Mucous membranes are moist.     Pharynx: Oropharynx is clear.  Eyes:     Extraocular Movements: Extraocular movements intact.     Conjunctiva/sclera: Conjunctivae normal.  Cardiovascular:     Rate and Rhythm: Normal rate and regular rhythm.     Pulses: Normal pulses.     Heart sounds: Normal heart sounds. No murmur heard. Pulmonary:     Effort: Pulmonary effort is normal. No respiratory distress.     Breath sounds: Normal breath sounds.  Abdominal:     General: There is no distension.     Palpations: Abdomen is soft.     Tenderness: There is no abdominal tenderness.  Musculoskeletal:        General: No swelling or tenderness.     Cervical back: Normal range of motion and neck supple.  Skin:    General: Skin is warm and dry.     Capillary Refill: Capillary refill takes less than 2 seconds.     Coloration: Skin is pale.  Neurological:     General: No focal deficit present.     Mental Status: She is  alert and oriented to person, place, and time.  Psychiatric:        Mood and Affect: Mood normal.     (all labs ordered are listed, but only abnormal results are displayed) Labs Reviewed - No data to display  EKG: None  Radiology: No results found.  {Document cardiac monitor, telemetry assessment procedure when appropriate:32947} Procedures   Medications Ordered in the ED - No data to display    {Click here for ABCD2, HEART and other calculators REFRESH Note before signing:1}  Medical Decision Making  This patient presents to the ED for concern of hematemesis and dizziness, this involves an extensive number of treatment options, and is a complaint that carries with it a high risk of complications and morbidity.  The differential diagnosis includes Mallory-Weiss tear, upper GI bleed, vertigo, orthostatic hypotension, blood loss anemia   Co morbidities / Chronic conditions that complicate the patient evaluation  GERD, pancreatitis, hypothyroidism, and orthostatic hypotension   Additional history obtained:  Additional history obtained from EMR External records from outside source obtained and reviewed including Care Everywhere   Lab Tests:  I Ordered, and personally interpreted labs.  The pertinent results include: Anemia 11.1 which is chronic per historical values, hyponatremia 131, elevated bun at 33, elevated lipase at 57   Imaging Studies ordered: I ordered imaging studies including ***  I independently visualized and interpreted imaging which showed *** I agree with the radiologist interpretation   Cardiac Monitoring: / EKG:  The patient was maintained on a cardiac monitor.  I personally viewed and interpreted the cardiac monitored which showed an underlying rhythm of: ***   Problem List / ED Course / Critical interventions / Medication management I ordered medication including ***   Reevaluation of the patient after these  medicines showed that the patient *** I have reviewed the patients home medicines and have made adjustments as needed   Consultations Obtained:  I requested consultation with the ***,  and discussed lab and imaging findings as well as pertinent plan - they recommend: ***  Test / Admission - Considered:  ***   {Document critical care time when appropriate  Document review of labs and clinical decision tools ie CHADS2VASC2, etc  Document your independent review of radiology images and any outside records  Document your discussion with family members, caretakers and with consultants  Document social determinants of health affecting pt's care  Document your decision making why or why not admission, treatments were needed:32947:::1}   Final diagnoses:  None    ED Discharge Orders     None

## 2023-10-08 NOTE — ED Notes (Signed)
 Pt placed on 4L [place] due to low O2 sats ranging from 84-88%. Sats improved to 96%.

## 2023-10-09 ENCOUNTER — Other Ambulatory Visit: Payer: Self-pay

## 2023-10-09 ENCOUNTER — Emergency Department (HOSPITAL_COMMUNITY): Payer: Medicare (Managed Care)

## 2023-10-09 ENCOUNTER — Encounter (HOSPITAL_COMMUNITY): Payer: Self-pay | Admitting: Internal Medicine

## 2023-10-09 ENCOUNTER — Inpatient Hospital Stay (HOSPITAL_COMMUNITY): Payer: Medicare (Managed Care) | Admitting: Certified Registered"

## 2023-10-09 ENCOUNTER — Encounter (HOSPITAL_COMMUNITY)
Admission: EM | Disposition: A | Discharge status: Skilled Nursing Facility | Payer: Self-pay | Source: Skilled Nursing Facility | Attending: Internal Medicine

## 2023-10-09 DIAGNOSIS — K222 Esophageal obstruction: Secondary | ICD-10-CM | POA: Diagnosis present

## 2023-10-09 DIAGNOSIS — K2950 Unspecified chronic gastritis without bleeding: Secondary | ICD-10-CM

## 2023-10-09 DIAGNOSIS — Z79899 Other long term (current) drug therapy: Secondary | ICD-10-CM | POA: Diagnosis not present

## 2023-10-09 DIAGNOSIS — D62 Acute posthemorrhagic anemia: Secondary | ICD-10-CM | POA: Diagnosis present

## 2023-10-09 DIAGNOSIS — Z66 Do not resuscitate: Secondary | ICD-10-CM | POA: Diagnosis present

## 2023-10-09 DIAGNOSIS — E871 Hypo-osmolality and hyponatremia: Secondary | ICD-10-CM | POA: Diagnosis present

## 2023-10-09 DIAGNOSIS — K2289 Other specified disease of esophagus: Secondary | ICD-10-CM

## 2023-10-09 DIAGNOSIS — K922 Gastrointestinal hemorrhage, unspecified: Secondary | ICD-10-CM | POA: Diagnosis present

## 2023-10-09 DIAGNOSIS — J690 Pneumonitis due to inhalation of food and vomit: Secondary | ICD-10-CM | POA: Diagnosis not present

## 2023-10-09 DIAGNOSIS — Z91018 Allergy to other foods: Secondary | ICD-10-CM | POA: Diagnosis not present

## 2023-10-09 DIAGNOSIS — M797 Fibromyalgia: Secondary | ICD-10-CM | POA: Diagnosis present

## 2023-10-09 DIAGNOSIS — I10 Essential (primary) hypertension: Secondary | ICD-10-CM | POA: Diagnosis present

## 2023-10-09 DIAGNOSIS — K3189 Other diseases of stomach and duodenum: Secondary | ICD-10-CM

## 2023-10-09 DIAGNOSIS — Z888 Allergy status to other drugs, medicaments and biological substances status: Secondary | ICD-10-CM | POA: Diagnosis not present

## 2023-10-09 DIAGNOSIS — K920 Hematemesis: Secondary | ICD-10-CM | POA: Diagnosis not present

## 2023-10-09 DIAGNOSIS — R5381 Other malaise: Secondary | ICD-10-CM | POA: Diagnosis present

## 2023-10-09 DIAGNOSIS — Z8249 Family history of ischemic heart disease and other diseases of the circulatory system: Secondary | ICD-10-CM | POA: Diagnosis not present

## 2023-10-09 DIAGNOSIS — K219 Gastro-esophageal reflux disease without esophagitis: Secondary | ICD-10-CM | POA: Diagnosis present

## 2023-10-09 DIAGNOSIS — H548 Legal blindness, as defined in USA: Secondary | ICD-10-CM | POA: Diagnosis present

## 2023-10-09 DIAGNOSIS — Z1152 Encounter for screening for COVID-19: Secondary | ICD-10-CM | POA: Diagnosis not present

## 2023-10-09 DIAGNOSIS — K31A11 Gastric intestinal metaplasia without dysplasia, involving the antrum: Secondary | ICD-10-CM

## 2023-10-09 DIAGNOSIS — W19XXXA Unspecified fall, initial encounter: Secondary | ICD-10-CM | POA: Diagnosis present

## 2023-10-09 DIAGNOSIS — K449 Diaphragmatic hernia without obstruction or gangrene: Secondary | ICD-10-CM

## 2023-10-09 DIAGNOSIS — D519 Vitamin B12 deficiency anemia, unspecified: Secondary | ICD-10-CM | POA: Diagnosis present

## 2023-10-09 DIAGNOSIS — E039 Hypothyroidism, unspecified: Secondary | ICD-10-CM | POA: Diagnosis present

## 2023-10-09 DIAGNOSIS — F32A Depression, unspecified: Secondary | ICD-10-CM | POA: Diagnosis present

## 2023-10-09 DIAGNOSIS — I951 Orthostatic hypotension: Secondary | ICD-10-CM | POA: Diagnosis present

## 2023-10-09 DIAGNOSIS — Z885 Allergy status to narcotic agent status: Secondary | ICD-10-CM | POA: Diagnosis not present

## 2023-10-09 DIAGNOSIS — F0390 Unspecified dementia without behavioral disturbance: Secondary | ICD-10-CM | POA: Diagnosis not present

## 2023-10-09 DIAGNOSIS — E063 Autoimmune thyroiditis: Secondary | ICD-10-CM | POA: Diagnosis not present

## 2023-10-09 DIAGNOSIS — Z79890 Hormone replacement therapy (postmenopausal): Secondary | ICD-10-CM | POA: Diagnosis not present

## 2023-10-09 DIAGNOSIS — F0393 Unspecified dementia, unspecified severity, with mood disturbance: Secondary | ICD-10-CM | POA: Diagnosis present

## 2023-10-09 DIAGNOSIS — J189 Pneumonia, unspecified organism: Secondary | ICD-10-CM | POA: Diagnosis present

## 2023-10-09 DIAGNOSIS — K3182 Dieulafoy lesion (hemorrhagic) of stomach and duodenum: Secondary | ICD-10-CM | POA: Diagnosis present

## 2023-10-09 HISTORY — PX: SUBMUCOSAL INJECTION: SHX5543

## 2023-10-09 HISTORY — PX: HEMOSTASIS CLIP PLACEMENT: SHX6857

## 2023-10-09 HISTORY — PX: ESOPHAGOGASTRODUODENOSCOPY: SHX5428

## 2023-10-09 LAB — URINALYSIS, ROUTINE W REFLEX MICROSCOPIC
Bilirubin Urine: NEGATIVE
Glucose, UA: NEGATIVE mg/dL
Ketones, ur: NEGATIVE mg/dL
Nitrite: POSITIVE — AB
Protein, ur: NEGATIVE mg/dL
Specific Gravity, Urine: 1.015 (ref 1.005–1.030)
pH: 7 (ref 5.0–8.0)

## 2023-10-09 LAB — PROTIME-INR
INR: 1.1 (ref 0.8–1.2)
Prothrombin Time: 14.9 s (ref 11.4–15.2)

## 2023-10-09 LAB — MRSA NEXT GEN BY PCR, NASAL: MRSA by PCR Next Gen: NOT DETECTED

## 2023-10-09 LAB — HEMOGLOBIN AND HEMATOCRIT, BLOOD
HCT: 28.4 % — ABNORMAL LOW (ref 36.0–46.0)
HCT: 26.3 % — ABNORMAL LOW (ref 36.0–46.0)
Hemoglobin: 9.5 g/dL — ABNORMAL LOW (ref 12.0–15.0)
Hemoglobin: 8.8 g/dL — ABNORMAL LOW (ref 12.0–15.0)

## 2023-10-09 SURGERY — EGD (ESOPHAGOGASTRODUODENOSCOPY)
Anesthesia: Monitor Anesthesia Care

## 2023-10-09 MED ORDER — LACTATED RINGERS IV BOLUS: 1000.0000 mL | Freq: Once | INTRAVENOUS | Status: DC

## 2023-10-09 MED ORDER — SUCRALFATE 1 GM/10ML PO SUSP
1.0000 g | Freq: Two times a day (BID) | ORAL | Status: DC
  Administered 2023-10-09 – 2023-10-16 (×14): 1 g via ORAL
  Filled 2023-10-09 (×14): qty 10

## 2023-10-09 MED ORDER — SERTRALINE HCL 100 MG PO TABS
200.0000 mg | ORAL_TABLET | Freq: Every day | ORAL | Status: DC
  Administered 2023-10-09 – 2023-10-15 (×7): 200 mg via ORAL
  Filled 2023-10-09 (×7): qty 2

## 2023-10-09 MED ORDER — EPINEPHRINE 1 MG/10ML IJ SOSY
PREFILLED_SYRINGE | INTRAMUSCULAR | Status: AC
  Filled 2023-10-09: qty 10

## 2023-10-09 MED ORDER — LACTATED RINGERS IV BOLUS
1000.0000 mL | Freq: Once | INTRAVENOUS | Status: AC
  Administered 2023-10-09: 1000 mL via INTRAVENOUS

## 2023-10-09 MED ORDER — BOOST / RESOURCE BREEZE PO LIQD CUSTOM
1.0000 | Freq: Three times a day (TID) | ORAL | Status: DC
  Administered 2023-10-09 – 2023-10-14 (×7): 1 via ORAL
  Administered 2023-10-15: 237 mL via ORAL
  Administered 2023-10-15 – 2023-10-16 (×2): 1 via ORAL

## 2023-10-09 MED ORDER — SODIUM CHLORIDE (PF) 0.9 % IJ SOLN
PREFILLED_SYRINGE | INTRAMUSCULAR | Status: DC | PRN
  Administered 2023-10-09: 3 mL

## 2023-10-09 MED ORDER — SPOT INK MARKER SYRINGE KIT
PACK | SUBMUCOSAL | Status: AC
  Filled 2023-10-09: qty 5

## 2023-10-09 MED ORDER — SODIUM CHLORIDE 0.9 % IV SOLN: INTRAVENOUS | Status: AC

## 2023-10-09 MED ORDER — DONEPEZIL HCL 10 MG PO TABS
10.0000 mg | ORAL_TABLET | Freq: Every day | ORAL | Status: DC
  Administered 2023-10-09 – 2023-10-15 (×7): 10 mg via ORAL
  Filled 2023-10-09 (×7): qty 1

## 2023-10-09 MED ORDER — TRAZODONE HCL 50 MG PO TABS
150.0000 mg | ORAL_TABLET | Freq: Every day | ORAL | Status: DC
  Administered 2023-10-09 – 2023-10-15 (×7): 150 mg via ORAL
  Filled 2023-10-09 (×7): qty 1

## 2023-10-09 MED ORDER — SENNOSIDES-DOCUSATE SODIUM 8.6-50 MG PO TABS
1.0000 | ORAL_TABLET | Freq: Every evening | ORAL | Status: DC | PRN
  Administered 2023-10-14: 1 via ORAL
  Filled 2023-10-09: qty 1

## 2023-10-09 MED ORDER — LEVOTHYROXINE SODIUM 100 MCG PO TABS
100.0000 ug | ORAL_TABLET | Freq: Every day | ORAL | Status: DC
  Administered 2023-10-10 – 2023-10-16 (×7): 100 ug via ORAL
  Filled 2023-10-09 (×7): qty 1

## 2023-10-09 MED ORDER — ONDANSETRON HCL 4 MG/2ML IJ SOLN
4.0000 mg | Freq: Four times a day (QID) | INTRAMUSCULAR | Status: DC | PRN
  Administered 2023-10-09 – 2023-10-12 (×2): 4 mg via INTRAVENOUS
  Filled 2023-10-09 (×2): qty 2

## 2023-10-09 MED ORDER — FLUTICASONE PROPIONATE 50 MCG/ACT NA SUSP
1.0000 | Freq: Every day | NASAL | Status: DC
  Administered 2023-10-10 – 2023-10-16 (×7): 1 via NASAL
  Filled 2023-10-09: qty 16

## 2023-10-09 MED ORDER — TRAZODONE HCL 50 MG PO TABS: 25.0000 mg | ORAL_TABLET | Freq: Every evening | ORAL | Status: DC | PRN

## 2023-10-09 MED ORDER — ONDANSETRON HCL 4 MG PO TABS
4.0000 mg | ORAL_TABLET | Freq: Four times a day (QID) | ORAL | Status: DC | PRN
  Administered 2023-10-10 – 2023-10-14 (×2): 4 mg via ORAL
  Filled 2023-10-09 (×2): qty 1

## 2023-10-09 MED ORDER — ONDANSETRON HCL 4 MG/2ML IJ SOLN
INTRAMUSCULAR | Status: DC | PRN
  Administered 2023-10-09: 4 mg via INTRAVENOUS

## 2023-10-09 MED ORDER — AZITHROMYCIN 500 MG IV SOLR
500.0000 mg | Freq: Once | INTRAVENOUS | Status: AC
  Administered 2023-10-09: 500 mg via INTRAVENOUS
  Filled 2023-10-09: qty 5

## 2023-10-09 MED ORDER — PANTOPRAZOLE SODIUM 40 MG IV SOLR
40.0000 mg | Freq: Once | INTRAVENOUS | Status: AC
  Administered 2023-10-09: 40 mg via INTRAVENOUS
  Filled 2023-10-09: qty 10

## 2023-10-09 MED ORDER — LACTATED RINGERS IV SOLN
INTRAVENOUS | Status: DC | PRN
Start: 2023-10-09 — End: 2023-10-09

## 2023-10-09 MED ORDER — SPOT INK MARKER SYRINGE KIT
PACK | SUBMUCOSAL | Status: DC | PRN
  Administered 2023-10-09: 2 mL via SUBMUCOSAL

## 2023-10-09 MED ORDER — ACETAMINOPHEN 325 MG PO TABS
650.0000 mg | ORAL_TABLET | Freq: Four times a day (QID) | ORAL | Status: DC | PRN
  Administered 2023-10-09 – 2023-10-14 (×12): 650 mg via ORAL
  Filled 2023-10-09 (×12): qty 2

## 2023-10-09 MED ORDER — ALBUTEROL SULFATE (2.5 MG/3ML) 0.083% IN NEBU
2.5000 mg | INHALATION_SOLUTION | RESPIRATORY_TRACT | Status: DC | PRN
  Administered 2023-10-12: 2.5 mg via RESPIRATORY_TRACT
  Filled 2023-10-09: qty 3

## 2023-10-09 MED ORDER — SODIUM CHLORIDE 0.9 % IV SOLN
1.0000 g | Freq: Once | INTRAVENOUS | Status: AC
Start: 2023-10-09 — End: 2023-10-09
  Administered 2023-10-09: 1 g via INTRAVENOUS
  Filled 2023-10-09: qty 10

## 2023-10-09 MED ORDER — PROPOFOL 10 MG/ML IV BOLUS
INTRAVENOUS | Status: DC | PRN
  Administered 2023-10-09: 50 mg via INTRAVENOUS

## 2023-10-09 MED ORDER — PANTOPRAZOLE SODIUM 40 MG IV SOLR
40.0000 mg | Freq: Two times a day (BID) | INTRAVENOUS | Status: DC
  Administered 2023-10-09 – 2023-10-12 (×7): 40 mg via INTRAVENOUS
  Filled 2023-10-09 (×7): qty 10

## 2023-10-09 MED ORDER — ACETAMINOPHEN 650 MG RE SUPP: 650.0000 mg | Freq: Four times a day (QID) | RECTAL | Status: DC | PRN

## 2023-10-09 MED ORDER — ESMOLOL HCL 100 MG/10ML IV SOLN
INTRAVENOUS | Status: DC | PRN
  Administered 2023-10-09: 10 mg via INTRAVENOUS

## 2023-10-09 MED ORDER — DIPHENHYDRAMINE HCL 25 MG PO CAPS
25.0000 mg | ORAL_CAPSULE | Freq: Four times a day (QID) | ORAL | Status: DC | PRN
  Administered 2023-10-09 – 2023-10-14 (×4): 25 mg via ORAL
  Filled 2023-10-09 (×4): qty 1

## 2023-10-09 MED ORDER — PROPOFOL 500 MG/50ML IV EMUL
INTRAVENOUS | Status: DC | PRN
  Administered 2023-10-09: 125 ug/kg/min via INTRAVENOUS

## 2023-10-09 NOTE — ED Notes (Signed)
 HIPAA compliant update provided to the patients facilities RN - consent was provided from by the patient prior to call.

## 2023-10-09 NOTE — ED Notes (Signed)
 Pt stated that she is legally blind

## 2023-10-09 NOTE — Anesthesia Postprocedure Evaluation (Signed)
 Anesthesia Post Note  Patient: Erika Conley  Procedure(s) Performed: EGD (ESOPHAGOGASTRODUODENOSCOPY) INJECTION, SUBMUCOSAL CONTROL OF HEMORRHAGE, GI TRACT, ENDOSCOPIC, BY CLIPPING OR OVERSEWING     Patient location during evaluation: PACU Anesthesia Type: MAC Level of consciousness: awake and alert and oriented Pain management: pain level controlled Vital Signs Assessment: post-procedure vital signs reviewed and stable Respiratory status: spontaneous breathing, nonlabored ventilation and respiratory function stable Cardiovascular status: stable and blood pressure returned to baseline Postop Assessment: no apparent nausea or vomiting Anesthetic complications: no   No notable events documented.  Last Vitals:  Vitals:   10/09/23 1320 10/09/23 1330  BP: (!) 152/54 (!) 164/38  Pulse: 74 80  Resp: 17 17  Temp:    SpO2: 95% 94%    Last Pain:  Vitals:   10/09/23 1330  TempSrc:   PainSc: 0-No pain                 Layla Gramm A.

## 2023-10-09 NOTE — ED Notes (Signed)
 Pt cleaned up of urinary incontinence. Brief and chux changed. Denies further needs at this time.

## 2023-10-09 NOTE — ED Notes (Signed)
 Pt requesting ice chips - EDP requested a swallow eval. Before the eval could be completely performed, pt had a small episode of bright red emesis. EDP made aware.

## 2023-10-09 NOTE — Anesthesia Preprocedure Evaluation (Addendum)
 Anesthesia Evaluation  Patient identified by MRN, date of birth, ID band Patient awake    Reviewed: Allergy & Precautions, NPO status , Patient's Chart, lab work & pertinent test results  Airway Mallampati: II  TM Distance: >3 FB     Dental  (+) Edentulous Upper, Edentulous Lower   Pulmonary neg pulmonary ROS   Pulmonary exam normal breath sounds clear to auscultation       Cardiovascular hypertension, Pt. on medications Normal cardiovascular exam Rhythm:Regular Rate:Normal  Normal Echo 2024  EKG 10/08/23 NSR, Normal   Neuro/Psych  PSYCHIATRIC DISORDERS Anxiety Depression   Dementia negative neurological ROS     GI/Hepatic Neg liver ROS,GERD  Medicated,,Hematemesis   Endo/Other  Hypothyroidism    Renal/GU negative Renal ROS  negative genitourinary   Musculoskeletal  (+) Arthritis , Osteoarthritis,  Fibromyalgia -  Abdominal   Peds  Hematology  (+) Blood dyscrasia, anemia   Anesthesia Other Findings   Reproductive/Obstetrics                              Anesthesia Physical Anesthesia Plan  ASA: 3 and emergent  Anesthesia Plan: MAC   Post-op Pain Management: Minimal or no pain anticipated   Induction: Intravenous  PONV Risk Score and Plan: 2 and Treatment may vary due to age or medical condition and Propofol  infusion  Airway Management Planned: Natural Airway, Simple Face Mask and Nasal Cannula  Additional Equipment: None  Intra-op Plan:   Post-operative Plan:   Informed Consent: I have reviewed the patients History and Physical, chart, labs and discussed the procedure including the risks, benefits and alternatives for the proposed anesthesia with the patient or authorized representative who has indicated his/her understanding and acceptance.   Patient has DNR.  Suspend DNR and Discussed DNR with patient.   Dental advisory given  Plan Discussed with: CRNA and  Anesthesiologist  Anesthesia Plan Comments:          Anesthesia Quick Evaluation

## 2023-10-09 NOTE — ED Notes (Addendum)
 Pt had a syncopal episode when cgoing from sitting to standing during Orthostatic VS. Pt endorses dizziness and became weak in this Rns arms - Ronal, NT and charge RN Celedonio) assisted with getting the patient back into the bed. EDP at the bedside for reassessment.

## 2023-10-09 NOTE — ED Notes (Signed)
 Pt provided a brief change at this time. Call bell within reach.

## 2023-10-09 NOTE — Op Note (Signed)
 [facility] Patient Name: Erika Conley Procedure Date: 10/09/2023 MRN: [id number] Attending MD: Aloha Finner , MD, [id number] Date of Birth: 1934/07/14 CSN: [id number] Age: 89 Admit Type: Inpatient Procedure:                Upper GI endoscopy Indications:              Acute post hemorrhagic anemia, Iron deficiency                            anemia secondary to chronic blood loss, Hematemesis Providers:                Aloha Finner, MD, Ozell Pouch, Farris [place]gate, Technician Referring MD:             Inpatient medical service Medicines:                Monitored Anesthesia Care Complications:            No immediate complications. Estimated Blood Loss:     Estimated blood loss was minimal. Procedure:                Pre-Anesthesia Assessment:                           - Prior to the procedure, a History and Physical                            was performed, and patient medications and                            allergies were reviewed. The patient's tolerance of                            previous anesthesia was also reviewed. The risks                            and benefits of the procedure and the sedation                            options and risks were discussed with the patient.                            All questions were answered, and informed consent                            was obtained. Prior Anticoagulants: The patient has                            taken no anticoagulant or antiplatelet agents. ASA                            Grade Assessment: III - A patient with severe  systemic disease. After reviewing the risks and                            benefits, the patient was deemed in satisfactory                            condition to undergo the procedure.                           After obtaining informed consent, the endoscope was                            passed under direct vision.  Throughout the                            procedure, the patient's blood pressure, pulse, and                            oxygen saturations were monitored continuously. The                            [id number]-H190 ([id number]) Olympus endoscope was introduced                            through the mouth, and advanced to the second part                            of duodenum. The upper GI endoscopy was                            accomplished without difficulty. The patient                            tolerated the procedure. Scope In: Scope Out: Findings:      No gross lesions were noted in the entire esophagus.      A non-obstructing Schatzki ring was found at the gastroesophageal       junction.      The Z-line was irregular and was found 32 cm from the incisors.      A 3 cm hiatal hernia was present.      Hematin (altered blood/coffee-ground-like material) was found in the       entire examined stomach. Lavage of the area was performed using a large       amount, resulting in clearance with adequate visualization.      A likely Dieulafoy raised lesion with mild oozing and stigmata of recent       bleeding was found in the cardia. To decrease the risk of bleeding       during intervention, the lesion was successfully injected with 3 mL of a       1:100,000 solution of epinephrine  for hemostasis. Subsequently,       fulguration to ablate the lesion by bipolar probe was successful. To       prevent bleeding post-intervention, four hemostatic clips were       successfully placed. Clip manufacturer: [employer]. There was no  bleeding at the end of the procedure. Area adjacent to the Dieulafoy's       was tattooed with an injection of Spot (carbon black) for demarcation       purposes in future if necessary.      Localized mucosal changes characterized by granularity and altered       texture were found in the prepyloric region of the stomach. Biopsies       were taken with a cold  forceps for histology to rule out dysplasia.      Patchy mildly erythematous mucosa without bleeding was found in the       entire examined stomach. Biopsies were taken with a cold forceps for       histology and Helicobacter pylori testing.      No gross lesions were noted in the duodenal bulb, in the first portion       of the duodenum and in the second portion of the duodenum. Biopsies for       histology were taken with a cold forceps for evaluation of celiac       disease. Impression:               - No gross lesions in the entire esophagus.                            Schatzki ring. Z-line irregular, 32 cm from the                            incisors.                           - 3 cm hiatal hernia.                           - Hematin (altered blood/coffee-ground-like                            material) in the entire stomach. Lavage with                            adequate visualization.                           - Dieulafoy lesion of stomach noted in the cardia.                            Epinephrine /fulguration via BiCap/clipping                            performed. Tattoo placed adjacent.                           - Granular and texture changed mucosa in the                            prepyloric region of the stomach. Biopsied.                           - Erythematous mucosa in the stomach. Biopsied.                           -  No gross lesions in the duodenal bulb, in the                            first portion of the duodenum and in the second                            portion of the duodenum. Biopsied. Moderate Sedation:      Not Applicable - Patient had care per Anesthesia. Recommendation:           - The patient will be observed post-procedure,                            until all discharge criteria are met.                           - Discharge patient to home.                           - Patient has a contact number available for                            emergencies.  The signs and symptoms of potential                            delayed complications were discussed with the                            patient. Return to normal activities tomorrow.                            Written discharge instructions were provided to the                            patient.                           - Clear liquid diet today.                           - IV PPI for next 48 hours. May transition to p.o.                            PPI thereafter.                           - Carafate  1 g twice daily liquid slurry for 2                            weeks.                           - Observe patient's clinical course.                           - Recommend IV iron as outpatient be scheduled.                           -  Await pathology results.                           - If patient has evidence of recurrent hemodynamic                            instability with hematemesis, then recommend                            consideration of upper endoscopy once stabilized                            versus CT angiography, if unstable.                           - The findings and recommendations were discussed                            with the patient.                           - The findings and recommendations were discussed                            with the referring physician. Procedure Code(s):        --- Professional ---                           43255, 59, Esophagogastroduodenoscopy, flexible,                            transoral; with control of bleeding, any method                           43239, Esophagogastroduodenoscopy, flexible,                            transoral; with biopsy, single or multiple                           43236, 59, Esophagogastroduodenoscopy, flexible,                            transoral; with directed submucosal injection(s),                            any substance Diagnosis Code(s):        --- Professional ---                            K22.89, Other specified disease of esophagus                           K44.9, Diaphragmatic hernia without obstruction or                            gangrene  K92.2, Gastrointestinal hemorrhage, unspecified                           K31.82, Dieulafoy lesion (hemorrhagic) of stomach                            and duodenum                           K31.89, Other diseases of stomach and duodenum                           D62, Acute posthemorrhagic anemia                           D50.0, Iron deficiency anemia secondary to blood                            loss (chronic)                           K92.0, Hematemesis CPT copyright 2022 [employer]. All rights reserved. The codes documented in this report are preliminary and upon coder review may  be revised to meet current compliance requirements. Aloha Finner, MD 10/09/2023 1:19:36 PM Number of Addenda: 0

## 2023-10-09 NOTE — Consult Note (Signed)
 Referring Provider: EDP Primary Care Physician:  Pcp, No Primary Gastroenterologist:  Dr. Teressa  Reason for Consultation:  Hematemesis  HPI: Erika Conley is a 89 y.o. female with medical history significant for GERD, pancreatitis, hypothyroidism, history of dizziness and orthostatic hypotension presents to the hospital with hematemesis.  Patient states that she does chronically have dizziness and has a lot of issues with her stomach such as GERD and IBS and really has to watch what she eats.  Says that she did not feel well all day yesterday.  After having supper she had sudden onset of severe nausea, felt very dizzy walked to the bathroom.  She states that she fell to the ground at her facility, unclear whether she lost consciousness, but says that she knows that she did not hit her head.  She denies any history of GI bleeding.  No blood thinners or NSAIDs.  In any case, after this, she had at least 2 episodes of vomiting, with bright red blood present.  Then reports another episode on the EMS ride here and another in the ED.  BUN elevated at 33.  Hemoglobin 9.5 g, down from 10.5 -11 g baseline.  Being treated for aspiration PNA and is on pantoprazole  40 mg IV BID.  Takes PPI and pepcid  at home for her reflux.  Did not notice any black or bloody stools at home.  Last upper endoscopy performed at the time of an ERCP in October 2017 with grossly normal appearance of the upper GI tract at that time.  Last colonoscopy 2003.   Past Medical History:  Diagnosis Date   Anxiety    Arthritis    Depression    Falls frequently    Fibromyalgia    Fracture, clavicle    GERD (gastroesophageal reflux disease)    History of fractured vertebra    Hypertension    Hypothyroidism    Seasonal allergies    Thyroid  disease    Vertigo    Vitamin A deficiency    Wrist fracture, right     Past Surgical History:  Procedure Laterality Date   ABDOMINAL HYSTERECTOMY     BACK SURGERY     CHOLECYSTECTOMY      ERCP N/A 10/22/2015   Procedure: ENDOSCOPIC RETROGRADE CHOLANGIOPANCREATOGRAPHY (ERCP);  Surgeon: Toribio SHAUNNA Teressa, MD;  Location: [facility];  Service: Endoscopy;  Laterality: N/A;   ERCP N/A 11/25/2015   Procedure: ENDOSCOPIC RETROGRADE CHOLANGIOPANCREATOGRAPHY (ERCP);  Surgeon: Toribio SHAUNNA Teressa, MD;  Location: [facility];  Service: Endoscopy;  Laterality: N/A;   EUS N/A 11/25/2015   Procedure: ESOPHAGEAL ENDOSCOPIC ULTRASOUND (EUS) RADIAL;  Surgeon: Toribio SHAUNNA Teressa, MD;  Location: [facility];  Service: Endoscopy;  Laterality: N/A;   FRACTURE SURGERY     ORIF ANKLE FRACTURE Right 01/30/2023   Procedure: OPEN REDUCTION INTERNAL FIXATION (ORIF) ANKLE FRACTURE;  Surgeon: Germaine Redbird, MD;  Location: [facility];  Service: Orthopedics;  Laterality: Right;  regular table, C-arm, arthrex   THYROIDECTOMY     WRIST SURGERY      Prior to Admission medications   Medication Sig Start Date End Date Taking? Authorizing Provider  acetaminophen  (TYLENOL ) 325 MG tablet Take 1 tablet (325 mg total) by mouth every 4 (four) hours as needed for headache. Patient taking differently: Take 325-650 mg by mouth as directed. Take 650 mg BID & Take 325 mg every 8 hours PRN for headache 04/18/22  Yes Lue Elsie BROCKS, MD  amLODipine  (NORVASC ) 10 MG tablet Take 1 tablet (10 mg total)  by mouth daily. 04/19/22  Yes Lue Elsie BROCKS, MD  busPIRone  (BUSPAR ) 10 MG tablet Take 20 mg by mouth 2 (two) times daily.   Yes [provider]  calcium  carbonate (TUMS - DOSED IN MG ELEMENTAL CALCIUM ) 500 MG chewable tablet Chew 2-3 tablets by mouth as directed. Give 1 tablet by mouth before meals & Give 2 tablets by mouth PRN for TID with meals   Yes [provider]  Carboxymethylcellul-Glycerin (REFRESH OPTIVE OP) Place 1 drop into both eyes See admin instructions. Instill 1 drop into one or both eyes up to 6 times a day as needed for dryness   Yes [provider]  cetirizine (ZYRTEC) 10 MG tablet Take  10 mg by mouth daily as needed for allergies.   Yes [provider]  diclofenac  Sodium (VOLTAREN ) 1 % GEL Apply 2 g topically 2 (two) times daily as needed (for back pain).   Yes [provider]  dicyclomine  (BENTYL ) 20 MG tablet Take 1 tablet (20 mg total) by mouth 3 (three) times daily before meals. 02/03/23  Yes Tobie Yetta HERO, MD  donepezil  (ARICEPT ) 10 MG tablet Take 10 mg by mouth at bedtime.   Yes [provider]  famotidine  (PEPCID ) 20 MG tablet Take 20 mg by mouth 2 (two) times daily.   Yes [provider]  fluticasone  (FLONASE ) 50 MCG/ACT nasal spray Place 1 spray into both nostrils in the morning and at bedtime.   Yes [provider]  HYDROcodone -acetaminophen  (NORCO/VICODIN) 5-325 MG tablet Take 1 tablet by mouth every 6 (six) hours as needed for moderate pain (pain score 4-6) or severe pain (pain score 7-10). 02/03/23 02/03/24 Yes Tobie Yetta HERO, MD  levothyroxine  (SYNTHROID ) 100 MCG tablet Take 100 mcg by mouth daily before breakfast.   Yes [provider]  loperamide  (IMODIUM  A-D) 2 MG tablet Take 2-4 mg by mouth See admin instructions. Take 4 mg by mouth after the 1st loose stool movement and an additional 2 mg after each loose stool (CANNOT EXCEED 16 MG/24 HOURS)   Yes [provider]  LORazepam  (ATIVAN ) 0.5 MG tablet Take 0.5 mg by mouth at 5 PM every day Patient taking differently: Take 0.25-0.5 mg by mouth 2 (two) times daily. Take 0.25 mg by mouth in the morning & Take 0.5 mg by mouth in the evening 02/03/23  Yes Tobie Yetta HERO, MD  Menthol , Topical Analgesic, (BIOFREEZE COOL THE PAIN) 4 % GEL Apply 1 application  topically daily.   Yes [provider]  Multiple Vitamins-Minerals (HEALTHY EYES SUPERVISION 2 PO) Take 1 capsule by mouth 2 (two) times daily.   Yes [provider]  omeprazole  (PRILOSEC) 40 MG capsule Take 1 capsule (40 mg total) by mouth 2 (two) times daily. Take shortly before breakfast  and dinner meal Patient taking differently: Take 40 mg by mouth daily. 07/16/18  Yes Teressa Toribio SQUIBB, MD  promethazine  (PHENERGAN ) 25 MG tablet Take 25 mg by mouth every 4 (four) hours as needed for nausea or vomiting.   Yes [provider]  senna-docusate (SENOKOT-S) 8.6-50 MG tablet Take 1 tablet by mouth at bedtime as needed for mild constipation. 04/18/22  Yes Lue Elsie BROCKS, MD  sertraline  (ZOLOFT ) 100 MG tablet Take 200 mg by mouth at bedtime.   Yes [provider]  traZODone  (DESYREL ) 150 MG tablet Take 150 mg by mouth at bedtime.   Yes [provider]  hydrOXYzine  (ATARAX ) 10 MG tablet Take 1 tablet (10 mg total) by  mouth every 6 (six) hours as needed for itching. Patient not taking: Reported on 10/09/2023 05/20/22   Harris, Abigail, PA-C    Current Facility-Administered Medications  Medication Dose Route Frequency Provider Last Rate Last Admin   acetaminophen  (TYLENOL ) tablet 650 mg  650 mg Oral Q6H PRN Zella, Mir M, MD       Or   acetaminophen  (TYLENOL ) suppository 650 mg  650 mg Rectal Q6H PRN Zella, Mir M, MD       albuterol  (PROVENTIL ) (2.5 MG/3ML) 0.083% nebulizer solution 2.5 mg  2.5 mg Nebulization Q2H PRN Zella, Mir M, MD       donepezil  (ARICEPT ) tablet 10 mg  10 mg Oral QHS Zella, Mir M, MD       fluticasone  (FLONASE ) 50 MCG/ACT nasal spray 1 spray  1 spray Each Nare Daily Zella, Mir M, MD       lactated ringers  bolus 1,000 mL  1,000 mL Intravenous Once Keith, Kayla N, PA-C       [START ON 10/10/2023] levothyroxine  (SYNTHROID ) tablet 100 mcg  100 mcg Oral Q0600 Zella, Mir M, MD       ondansetron  (ZOFRAN ) tablet 4 mg  4 mg Oral Q6H PRN Zella, Mir M, MD       Or   ondansetron  (ZOFRAN ) injection 4 mg  4 mg Intravenous Q6H PRN Zella, Mir M, MD       pantoprazole  (PROTONIX ) injection 40 mg  40 mg Intravenous Q12H Zella, Mir M, MD       senna-docusate (Senokot-S) tablet 1 tablet  1 tablet Oral QHS PRN  Zella, Mir M, MD       sertraline  (ZOLOFT ) tablet 200 mg  200 mg Oral QHS Zella, Mir M, MD       traZODone  (DESYREL ) tablet 150 mg  150 mg Oral QHS Zella, Mir M, MD       traZODone  (DESYREL ) tablet 25 mg  25 mg Oral QHS PRN Zella, Mir M, MD       Current Outpatient Medications  Medication Sig Dispense Refill   acetaminophen  (TYLENOL ) 325 MG tablet Take 1 tablet (325 mg total) by mouth every 4 (four) hours as needed for headache. (Patient taking differently: Take 325-650 mg by mouth as directed. Take 650 mg BID & Take 325 mg every 8 hours PRN for headache) 30 tablet 0   amLODipine  (NORVASC ) 10 MG tablet Take 1 tablet (10 mg total) by mouth daily. 30 tablet 2   busPIRone  (BUSPAR ) 10 MG tablet Take 20 mg by mouth 2 (two) times daily.     calcium  carbonate (TUMS - DOSED IN MG ELEMENTAL CALCIUM ) 500 MG chewable tablet Chew 2-3 tablets by mouth as directed. Give 1 tablet by mouth before meals & Give 2 tablets by mouth PRN for TID with meals     Carboxymethylcellul-Glycerin (REFRESH OPTIVE OP) Place 1 drop into both eyes See admin instructions. Instill 1 drop into one or both eyes up to 6 times a day as needed for dryness     cetirizine (ZYRTEC) 10 MG tablet Take 10 mg by mouth daily as needed for allergies.     diclofenac  Sodium (VOLTAREN ) 1 % GEL Apply 2 g topically 2 (two) times daily as needed (for back pain).     dicyclomine  (BENTYL ) 20 MG tablet Take 1 tablet (20 mg total) by mouth 3 (three) times daily before meals. 90 tablet 0   donepezil  (ARICEPT ) 10 MG tablet Take 10 mg by mouth at bedtime.  famotidine  (PEPCID ) 20 MG tablet Take 20 mg by mouth 2 (two) times daily.     fluticasone  (FLONASE ) 50 MCG/ACT nasal spray Place 1 spray into both nostrils in the morning and at bedtime.     HYDROcodone -acetaminophen  (NORCO/VICODIN) 5-325 MG tablet Take 1 tablet by mouth every 6 (six) hours as needed for moderate pain (pain score 4-6) or severe pain (pain score 7-10). 20 tablet 0    levothyroxine  (SYNTHROID ) 100 MCG tablet Take 100 mcg by mouth daily before breakfast.     loperamide  (IMODIUM  A-D) 2 MG tablet Take 2-4 mg by mouth See admin instructions. Take 4 mg by mouth after the 1st loose stool movement and an additional 2 mg after each loose stool (CANNOT EXCEED 16 MG/24 HOURS)     LORazepam  (ATIVAN ) 0.5 MG tablet Take 0.5 mg by mouth at 5 PM every day (Patient taking differently: Take 0.25-0.5 mg by mouth 2 (two) times daily. Take 0.25 mg by mouth in the morning & Take 0.5 mg by mouth in the evening) 3 tablet 0   Menthol , Topical Analgesic, (BIOFREEZE COOL THE PAIN) 4 % GEL Apply 1 application  topically daily.     Multiple Vitamins-Minerals (HEALTHY EYES SUPERVISION 2 PO) Take 1 capsule by mouth 2 (two) times daily.     omeprazole  (PRILOSEC) 40 MG capsule Take 1 capsule (40 mg total) by mouth 2 (two) times daily. Take shortly before breakfast and dinner meal (Patient taking differently: Take 40 mg by mouth daily.) 60 capsule 5   promethazine  (PHENERGAN ) 25 MG tablet Take 25 mg by mouth every 4 (four) hours as needed for nausea or vomiting.     senna-docusate (SENOKOT-S) 8.6-50 MG tablet Take 1 tablet by mouth at bedtime as needed for mild constipation. 60 tablet 0   sertraline  (ZOLOFT ) 100 MG tablet Take 200 mg by mouth at bedtime.     traZODone  (DESYREL ) 150 MG tablet Take 150 mg by mouth at bedtime.     hydrOXYzine  (ATARAX ) 10 MG tablet Take 1 tablet (10 mg total) by mouth every 6 (six) hours as needed for itching. (Patient not taking: Reported on 10/09/2023) 30 tablet 0    Allergies as of 10/08/2023 - Review Complete 10/08/2023  Allergen Reaction Noted   Beta sitosterol [sitosterols] Other (See Comments) 01/30/2023   Blueberry [vaccinium angustifolium] Other (See Comments) 01/30/2023   Chocolate Itching and Other (See Comments) 04/12/2022   Codeine Other (See Comments) 07/20/2010   Cortisone Other (See Comments) 07/20/2010   Guggulipid-black pepper Itching 04/12/2022    Morphine  Other (See Comments) 10/13/2021   Nsaids Other (See Comments) 10/13/2021   Other Other (See Comments) 02/16/2013    Family History  Problem Relation Age of Onset   Hypertension Other     Social History   Socioeconomic History   Marital status: Divorced    Spouse name: Not on file   Number of children: Not on file   Years of education: Not on file   Highest education level: Not on file  Occupational History   Not on file  Tobacco Use   Smoking status: Never   Smokeless tobacco: Never  Vaping Use   Vaping status: Never Used  Substance and Sexual Activity   Alcohol  use: No   Drug use: No   Sexual activity: Not Currently  Other Topics Concern   Not on file  Social History Narrative   Not on file   Social Drivers of Health   Financial Resource Strain: Not on file  Food  Insecurity: No Food Insecurity (01/31/2023)   Hunger Vital Sign    Worried About Running Out of Food in the Last Year: Never true    Ran Out of Food in the Last Year: Never true  Transportation Needs: No Transportation Needs (01/31/2023)   PRAPARE - Administrator, Civil Service (Medical): No    Lack of Transportation (Non-Medical): No  Physical Activity: Not on file  Stress: Not on file  Social Connections: Not on file  Intimate Partner Violence: Not At Risk (01/31/2023)   Humiliation, Afraid, Rape, and Kick questionnaire    Fear of Current or Ex-Partner: No    Emotionally Abused: No    Physically Abused: No    Sexually Abused: No    Review of Systems: ROS is O/W negative except as mentioned in HPI.  Physical Exam: Vital signs in last 24 hours: Temp:  [97.7 F (36.5 C)-100.2 F (37.9 C)] 100.2 F (37.9 C) (08/19 0936) Pulse Rate:  [65-96] 72 (08/19 0936) Resp:  [15-20] 20 (08/19 0936) BP: (97-132)/(48-68) 125/48 (08/19 0936) SpO2:  [88 %-100 %] 99 % (08/19 0936) Weight:  [55.2 kg] 55.2 kg (08/18 2150)   General:  Alert, Well-developed, well-nourished, pleasant  and cooperative in NAD Head:  Normocephalic and atraumatic. Eyes:  Sclera clear, no icterus.   Conjunctiva pink. Ears:  Normal auditory acuity. Mouth:  No deformity or lesions.   Lungs:  Clear throughout to auscultation.  No wheezes, crackles, or rhonchi.  Heart:  Regular rate and rhythm; no murmurs, clicks, rubs, or gallops. Abdomen:  Soft, non-distended.  BS present.  Mild diffuse TTP> in the epigastrium.   Msk:  Symmetrical without gross deformities. Pulses:  Normal pulses noted. Extremities:  Without clubbing or edema. Neurologic:  Alert and oriented x 4;  grossly normal neurologically. Skin:  Intact without significant lesions or rashes. Psych:  Alert and cooperative. Normal mood and affect.  Lab Results: Recent Labs    10/08/23 2203 10/09/23 0341  WBC 7.1  --   HGB 11.1* 9.5*  HCT 33.9* 28.4*  PLT 200  --    BMET Recent Labs    10/08/23 2203  NA 131*  K 4.0  CL 99  CO2 24  GLUCOSE 171*  BUN 33*  CREATININE 0.84  CALCIUM  7.9*   LFT Recent Labs    10/08/23 2203  PROT 5.5*  ALBUMIN 3.3*  AST 18  ALT 10  ALKPHOS 55  BILITOT 0.5   PT/INR Recent Labs    10/09/23 0341  LABPROT 14.9  INR 1.1   Studies/Results: DG Chest Portable 1 View Result Date: 10/09/2023 CLINICAL DATA:  Hypoxia EXAM: PORTABLE CHEST 1 VIEW COMPARISON:  01/30/2023 FINDINGS: Low lung volumes. Heterogeneous airspace disease at left base suspicious for pneumonia. No pleural effusion. Normal cardiac size with aortic atherosclerosis. Old fracture deformity of the proximal left humerus IMPRESSION: Heterogeneous airspace disease at the left base suspicious for pneumonia. Electronically Signed   By: Luke Bun M.D.   On: 10/09/2023 02:11   IMPRESSION:  *Hematemesis with blood loss anemia-patient is hemodynamically stable, no prior history of GI bleeding, and not on any blood thinners.  Hemoglobin 9.5 g, down from 10.5 -11 g baseline.  BUN elevated at 33. *Aspiration PNA seen on chest x-ray  likely occurred with vomiting.  Breathing status fine on [place].  PLAN: - Protonix  40 mg IV twice daily. - Antiemetics. - Monitor hemoglobin and transfuse if needed. - Will need EGD, timing TBD. -Antibiotics for aspiration PNA.  Harlene BIRCH. Reatha Sur  10/09/2023, 10:22 AM

## 2023-10-09 NOTE — H&P (Signed)
 GASTROENTEROLOGY PROCEDURE H&P NOTE   Primary Care Physician: Pcp, No  HPI: Erika Conley is a 89 y.o. female who presents for who presents for EGD for evaluation of hematemesis and anemia.  Past Medical History:  Diagnosis Date   Anxiety    Arthritis    Depression    Falls frequently    Fibromyalgia    Fracture, clavicle    GERD (gastroesophageal reflux disease)    History of fractured vertebra    Hypertension    Hypothyroidism    Seasonal allergies    Thyroid  disease    Vertigo    Vitamin A deficiency    Wrist fracture, right    Past Surgical History:  Procedure Laterality Date   ABDOMINAL HYSTERECTOMY     BACK SURGERY     CHOLECYSTECTOMY     ERCP N/A 10/22/2015   Procedure: ENDOSCOPIC RETROGRADE CHOLANGIOPANCREATOGRAPHY (ERCP);  Surgeon: Toribio SHAUNNA Cedar, MD;  Location: [facility];  Service: Endoscopy;  Laterality: N/A;   ERCP N/A 11/25/2015   Procedure: ENDOSCOPIC RETROGRADE CHOLANGIOPANCREATOGRAPHY (ERCP);  Surgeon: Toribio SHAUNNA Cedar, MD;  Location: [facility];  Service: Endoscopy;  Laterality: N/A;   EUS N/A 11/25/2015   Procedure: ESOPHAGEAL ENDOSCOPIC ULTRASOUND (EUS) RADIAL;  Surgeon: Toribio SHAUNNA Cedar, MD;  Location: [facility];  Service: Endoscopy;  Laterality: N/A;   FRACTURE SURGERY     ORIF ANKLE FRACTURE Right 01/30/2023   Procedure: OPEN REDUCTION INTERNAL FIXATION (ORIF) ANKLE FRACTURE;  Surgeon: Germaine Redbird, MD;  Location: [facility];  Service: Orthopedics;  Laterality: Right;  regular table, C-arm, arthrex   THYROIDECTOMY     WRIST SURGERY     Current Facility-Administered Medications  Medication Dose Route Frequency Provider Last Rate Last Admin   0.9 %  sodium chloride  infusion   Intravenous Continuous Zehr, Jessica D, PA-C       [MAR Hold] acetaminophen  (TYLENOL ) tablet 650 mg  650 mg Oral Q6H PRN Zella, Mir M, MD       Or   ILDA Hold] acetaminophen  (TYLENOL ) suppository 650 mg  650 mg Rectal Q6H PRN Zella, Mir M, MD       [MAR Hold]  albuterol  (PROVENTIL ) (2.5 MG/3ML) 0.083% nebulizer solution 2.5 mg  2.5 mg Nebulization Q2H PRN Zella, Mir M, MD       [MAR Hold] donepezil  (ARICEPT ) tablet 10 mg  10 mg Oral QHS Zella, Mir M, MD       [facility] Hold] fluticasone  (FLONASE ) 50 MCG/ACT nasal spray 1 spray  1 spray Each Nare Daily Zella, Mir M, MD       [facility] Hold] lactated ringers  bolus 1,000 mL  1,000 mL Intravenous Once Keith, Kayla N, PA-C       [MAR Hold] levothyroxine  (SYNTHROID ) tablet 100 mcg  100 mcg Oral Q0600 Ikramullah, Mir M, MD       [facility] Hold] ondansetron  (ZOFRAN ) tablet 4 mg  4 mg Oral Q6H PRN Zella, Mir M, MD       Or   ILDA Hold] ondansetron  (ZOFRAN ) injection 4 mg  4 mg Intravenous Q6H PRN Zella, Mir M, MD       [facility] Hold] pantoprazole  (PROTONIX ) injection 40 mg  40 mg Intravenous Q12H Zella, Mir M, MD   40 mg at 10/09/23 1100   [MAR Hold] senna-docusate (Senokot-S) tablet 1 tablet  1 tablet Oral QHS PRN Zella, Mir M, MD       ILDA Hold] sertraline  (ZOLOFT ) tablet 200 mg  200 mg Oral QHS Zella, Mir CHRISTELLA, MD       [  MAR Hold] traZODone  (DESYREL ) tablet 150 mg  150 mg Oral QHS Zella, Mir M, MD       [facility] Hold] traZODone  (DESYREL ) tablet 25 mg  25 mg Oral QHS PRN Zella, Mir M, MD        Current Facility-Administered Medications:    0.9 %  sodium chloride  infusion, , Intravenous, Continuous, Zehr, Jessica D, PA-C   [MAR Hold] acetaminophen  (TYLENOL ) tablet 650 mg, 650 mg, Oral, Q6H PRN **OR** [MAR Hold] acetaminophen  (TYLENOL ) suppository 650 mg, 650 mg, Rectal, Q6H PRN, Zella, Mir M, MD   [MAR Hold] albuterol  (PROVENTIL ) (2.5 MG/3ML) 0.083% nebulizer solution 2.5 mg, 2.5 mg, Nebulization, Q2H PRN, Zella, Mir M, MD   [facility] Hold] donepezil  (ARICEPT ) tablet 10 mg, 10 mg, Oral, QHS, Zella, Mir M, MD   [MAR Hold] fluticasone  (FLONASE ) 50 MCG/ACT nasal spray 1 spray, 1 spray, Each Nare, Daily, Zella, Mir M, MD   [facility] Hold] lactated ringers  bolus 1,000 mL, 1,000  mL, Intravenous, Once, Francis Ileana SAILOR, PA-C   [MAR Hold] levothyroxine  (SYNTHROID ) tablet 100 mcg, 100 mcg, Oral, Q0600, Zella, Mir M, MD   [facility] Hold] ondansetron  (ZOFRAN ) tablet 4 mg, 4 mg, Oral, Q6H PRN **OR** [MAR Hold] ondansetron  (ZOFRAN ) injection 4 mg, 4 mg, Intravenous, Q6H PRN, Zella, Mir M, MD   [facility] Hold] pantoprazole  (PROTONIX ) injection 40 mg, 40 mg, Intravenous, Q12H, Zella, Mir M, MD, 40 mg at 10/09/23 1100   [MAR Hold] senna-docusate (Senokot-S) tablet 1 tablet, 1 tablet, Oral, QHS PRN, Zella, Mir M, MD   [MAR Hold] sertraline  (ZOLOFT ) tablet 200 mg, 200 mg, Oral, QHS, Ikramullah, Mir M, MD   [facility] Hold] traZODone  (DESYREL ) tablet 150 mg, 150 mg, Oral, QHS, Ikramullah, Mir M, MD   [facility] Hold] traZODone  (DESYREL ) tablet 25 mg, 25 mg, Oral, QHS PRN, Zella, Mir M, MD Allergies  Allergen Reactions   Beta Sitosterol [Sitosterols] Other (See Comments)    Allergic, per MAR   Blueberry [Vaccinium Angustifolium] Other (See Comments)    Allergic, per MAR   Chocolate Itching and Other (See Comments)    Allergic, per MAR   Codeine Other (See Comments)    Passes out and falls   Cortisone Other (See Comments)    Passes out and falls- Allergic, per MAR   Guggulipid-Black Pepper Itching    Pepper   Morphine  Other (See Comments)    Allergic, per [facility]   Nsaids Other (See Comments)    Allergic, per [facility]   Other Other (See Comments)    All narcotics cause patient to pass out and fall    Family History  Problem Relation Age of Onset   Hypertension Other    Social History   Socioeconomic History   Marital status: Divorced    Spouse name: Not on file   Number of children: Not on file   Years of education: Not on file   Highest education level: Not on file  Occupational History   Not on file  Tobacco Use   Smoking status: Never   Smokeless tobacco: Never  Vaping Use   Vaping status: Never Used  Substance and Sexual Activity   Alcohol   use: No   Drug use: No   Sexual activity: Not Currently  Other Topics Concern   Not on file  Social History Narrative   Not on file   Social Drivers of Health   Financial Resource Strain: Not on file  Food Insecurity: No Food Insecurity (01/31/2023)   Hunger Vital Sign  Worried About Programme researcher, broadcasting/film/video in the Last Year: Never true    Ran Out of Food in the Last Year: Never true  Transportation Needs: No Transportation Needs (01/31/2023)   PRAPARE - Administrator, Civil Service (Medical): No    Lack of Transportation (Non-Medical): No  Physical Activity: Not on file  Stress: Not on file  Social Connections: Not on file  Intimate Partner Violence: Not At Risk (01/31/2023)   Humiliation, Afraid, Rape, and Kick questionnaire    Fear of Current or Ex-Partner: No    Emotionally Abused: No    Physically Abused: No    Sexually Abused: No    Physical Exam: Today's Vitals   10/09/23 0500 10/09/23 0630 10/09/23 0936 10/09/23 1144  BP:  122/62 (!) 125/48 (!) 137/45  Pulse:  71 72 75  Resp:  20 20 15   Temp: 98.2 F (36.8 C)  100.2 F (37.9 C) 98 F (36.7 C)  TempSrc:   Oral Temporal  SpO2:  100% 99% 98%  Weight:      Height:      PainSc:    6    Body mass index is 21.56 kg/m. GEN: NAD EYE: Sclerae anicteric ENT: MMM CV: Non-tachycardic GI: Soft, NT/ND NEURO:  Alert & Oriented x 3  Lab Results: Recent Labs    10/08/23 2203 10/09/23 0341  WBC 7.1  --   HGB 11.1* 9.5*  HCT 33.9* 28.4*  PLT 200  --    BMET Recent Labs    10/08/23 2203  NA 131*  K 4.0  CL 99  CO2 24  GLUCOSE 171*  BUN 33*  CREATININE 0.84  CALCIUM  7.9*   LFT Recent Labs    10/08/23 2203  PROT 5.5*  ALBUMIN 3.3*  AST 18  ALT 10  ALKPHOS 55  BILITOT 0.5   PT/INR Recent Labs    10/09/23 0341  LABPROT 14.9  INR 1.1     Impression / Plan: This is a 89 y.o.female who presents for EGD for evaluation of hematemesis and anemia.  The risks and benefits of  endoscopic evaluation/treatment were discussed with the patient and/or family; these include but are not limited to the risk of perforation, infection, bleeding, missed lesions, lack of diagnosis, severe illness requiring hospitalization, as well as anesthesia and sedation related illnesses.  The patient's history has been reviewed, patient examined, no change in status, and deemed stable for procedure.  The patient and/or family is agreeable to proceed.    Aloha Finner, MD [facility] Office # [phone]

## 2023-10-09 NOTE — H&P (Addendum)
 History and Physical  Erika Conley FMW:[id number] DOB: [birth year] DOA: 10/08/2023  PCP: Freddrick, No   Chief Complaint: Hematemesis  HPI: Erika Conley is a 89 y.o. female with medical history significant for GERD, pancreatitis, hypothyroidism, history of dizziness and orthostatic hypotension presents to the hospital with hematemesis and blood loss anemia.  Patient states that she does chronically have dizziness, yesterday after having supper she had sudden onset of severe nausea, felt very dizzy walked to the bathroom.  She states that she fell to the ground at her facility, unclear whether she lost consciousness.  She denies any history of GI bleeding, or bleeding on any blood thinners.  In any case, after this, she had at least 2 episodes of vomiting, with bright red blood present.  She denies any recent fevers, chills, changes in bowel or bladder habits.  Per EMS, she had large quantities of emesis, with bright red blood present.  She was given IV Zofran  by EMS, was noted in the ER to be saturating 84 to 88% on room air.  In the emergency department, she had an episode of syncope while going from sitting to standing while testing her orthostatic vital signs.  Due to concern for possible aspiration, she was given a dose of IV azithromycin  and IV Rocephin , she was also given IV Protonix , and hospitalist admission was requested after GI consult.  Review of Systems: Please see HPI for pertinent positives and negatives. A complete 10 system review of systems are otherwise negative.  Past Medical History:  Diagnosis Date   Anxiety    Arthritis    Depression    Falls frequently    Fibromyalgia    Fracture, clavicle    GERD (gastroesophageal reflux disease)    History of fractured vertebra    Hypertension    Hypothyroidism    Seasonal allergies    Thyroid  disease    Vertigo    Vitamin A deficiency    Wrist fracture, right    Past Surgical History:  Procedure Laterality Date   ABDOMINAL  HYSTERECTOMY     BACK SURGERY     CHOLECYSTECTOMY     ERCP N/A 10/22/2015   Procedure: ENDOSCOPIC RETROGRADE CHOLANGIOPANCREATOGRAPHY (ERCP);  Surgeon: Toribio SHAUNNA Cedar, MD;  Location: [facility];  Service: Endoscopy;  Laterality: N/A;   ERCP N/A 11/25/2015   Procedure: ENDOSCOPIC RETROGRADE CHOLANGIOPANCREATOGRAPHY (ERCP);  Surgeon: Toribio SHAUNNA Cedar, MD;  Location: [facility];  Service: Endoscopy;  Laterality: N/A;   EUS N/A 11/25/2015   Procedure: ESOPHAGEAL ENDOSCOPIC ULTRASOUND (EUS) RADIAL;  Surgeon: Toribio SHAUNNA Cedar, MD;  Location: [facility];  Service: Endoscopy;  Laterality: N/A;   FRACTURE SURGERY     ORIF ANKLE FRACTURE Right 01/30/2023   Procedure: OPEN REDUCTION INTERNAL FIXATION (ORIF) ANKLE FRACTURE;  Surgeon: Germaine Redbird, MD;  Location: [facility];  Service: Orthopedics;  Laterality: Right;  regular table, C-arm, arthrex   THYROIDECTOMY     WRIST SURGERY     Social History:  reports that she has never smoked. She has never used smokeless tobacco. She reports that she does not drink alcohol  and does not use drugs.  Allergies  Allergen Reactions   Beta Sitosterol [Sitosterols] Other (See Comments)    Allergic, per [facility]   Blueberry [Vaccinium Angustifolium] Other (See Comments)    Allergic, per [facility]   Chocolate Itching and Other (See Comments)    Allergic, per MAR   Codeine Other (See Comments)    Passes out and falls   Cortisone Other (See  Comments)    Passes out and falls- Allergic, per [facility]   Guggulipid-Black Pepper Itching    Pepper   Morphine  Other (See Comments)    Allergic, per [facility]   Nsaids Other (See Comments)    Allergic, per [facility]   Other Other (See Comments)    All narcotics cause patient to pass out and fall     Family History  Problem Relation Age of Onset   Hypertension Other      Prior to Admission medications   Medication Sig Start Date End Date Taking? Authorizing Provider  acetaminophen  (TYLENOL ) 325 MG tablet Take 1 tablet (325  mg total) by mouth every 4 (four) hours as needed for headache. Patient taking differently: Take 325-650 mg by mouth as directed. Take 650 mg BID & Take 325 mg every 8 hours PRN for headache 04/18/22  Yes Lue Elsie BROCKS, MD  amLODipine  (NORVASC ) 10 MG tablet Take 1 tablet (10 mg total) by mouth daily. 04/19/22  Yes Lue Elsie BROCKS, MD  busPIRone  (BUSPAR ) 10 MG tablet Take 20 mg by mouth 2 (two) times daily.   Yes [provider]  calcium  carbonate (TUMS - DOSED IN MG ELEMENTAL CALCIUM ) 500 MG chewable tablet Chew 2-3 tablets by mouth as directed. Give 1 tablet by mouth before meals & Give 2 tablets by mouth PRN for TID with meals   Yes [provider]  Carboxymethylcellul-Glycerin (REFRESH OPTIVE OP) Place 1 drop into both eyes See admin instructions. Instill 1 drop into one or both eyes up to 6 times a day as needed for dryness   Yes [provider]  cetirizine (ZYRTEC) 10 MG tablet Take 10 mg by mouth daily as needed for allergies.   Yes [provider]  diclofenac  Sodium (VOLTAREN ) 1 % GEL Apply 2 g topically 2 (two) times daily as needed (for back pain).   Yes [provider]  dicyclomine  (BENTYL ) 20 MG tablet Take 1 tablet (20 mg total) by mouth 3 (three) times daily before meals. 02/03/23  Yes Tobie Yetta HERO, MD  donepezil  (ARICEPT ) 10 MG tablet Take 10 mg by mouth at bedtime.   Yes [provider]  famotidine  (PEPCID ) 20 MG tablet Take 20 mg by mouth 2 (two) times daily.   Yes [provider]  fluticasone  (FLONASE ) 50 MCG/ACT nasal spray Place 1 spray into both nostrils in the morning and at bedtime.   Yes [provider]  HYDROcodone -acetaminophen  (NORCO/VICODIN) 5-325 MG tablet Take 1 tablet by mouth every 6 (six) hours as needed for moderate pain (pain score 4-6) or severe pain (pain score 7-10). 02/03/23 02/03/24 Yes Tobie Yetta HERO, MD  levothyroxine  (SYNTHROID ) 100 MCG tablet Take 100 mcg by mouth daily before  breakfast.   Yes [provider]  loperamide  (IMODIUM  A-D) 2 MG tablet Take 2-4 mg by mouth See admin instructions. Take 4 mg by mouth after the 1st loose stool movement and an additional 2 mg after each loose stool (CANNOT EXCEED 16 MG/24 HOURS)   Yes [provider]  LORazepam  (ATIVAN ) 0.5 MG tablet Take 0.5 mg by mouth at 5 PM every day Patient taking differently: Take 0.25-0.5 mg by mouth 2 (two) times daily. Take 0.25 mg by mouth in the morning & Take 0.5 mg by mouth in the evening 02/03/23  Yes Tobie Yetta HERO, MD  Menthol , Topical Analgesic, (BIOFREEZE COOL THE PAIN) 4 % GEL Apply 1 application  topically daily.   Yes [provider]  Multiple Vitamins-Minerals (HEALTHY  EYES SUPERVISION 2 PO) Take 1 capsule by mouth 2 (two) times daily.   Yes [provider]  omeprazole  (PRILOSEC) 40 MG capsule Take 1 capsule (40 mg total) by mouth 2 (two) times daily. Take shortly before breakfast and dinner meal Patient taking differently: Take 40 mg by mouth daily. 07/16/18  Yes Teressa Toribio SQUIBB, MD  promethazine  (PHENERGAN ) 25 MG tablet Take 25 mg by mouth every 4 (four) hours as needed for nausea or vomiting.   Yes [provider]  senna-docusate (SENOKOT-S) 8.6-50 MG tablet Take 1 tablet by mouth at bedtime as needed for mild constipation. 04/18/22  Yes Lue Elsie BROCKS, MD  sertraline  (ZOLOFT ) 100 MG tablet Take 200 mg by mouth at bedtime.   Yes [provider]  traZODone  (DESYREL ) 150 MG tablet Take 150 mg by mouth at bedtime.   Yes [provider]  hydrOXYzine  (ATARAX ) 10 MG tablet Take 1 tablet (10 mg total) by mouth every 6 (six) hours as needed for itching. Patient not taking: Reported on 10/09/2023 05/20/22   Arloa Chroman, PA-C    Physical Exam: BP 122/62 (BP Location: Right Arm)   Pulse 71   Temp 98.2 F (36.8 C)   Resp 20   Ht 5' 3 (1.6 m)   Wt 55.2 kg   SpO2 100%   BMI 21.56 kg/m  General:  Alert, oriented, calm, in  no acute distress, resting comfortably on room air Cardiovascular: RRR, no murmurs or rubs, no peripheral edema  Respiratory: clear to auscultation bilaterally, no wheezes, no crackles  Abdomen: soft, nontender, nondistended  Skin: dry, no rashes  Musculoskeletal: no joint effusions, normal range of motion  Psychiatric: appropriate affect, normal speech  Neurologic: extraocular muscles intact, clear speech, moving all extremities with intact sensorium         Labs on Admission:  Basic Metabolic Panel: Recent Labs  Lab 10/08/23 2203  NA 131*  K 4.0  CL 99  CO2 24  GLUCOSE 171*  BUN 33*  CREATININE 0.84  CALCIUM  7.9*   Liver Function Tests: Recent Labs  Lab 10/08/23 2203  AST 18  ALT 10  ALKPHOS 55  BILITOT 0.5  PROT 5.5*  ALBUMIN 3.3*   Recent Labs  Lab 10/08/23 2203  LIPASE 57*   No results for input(s): AMMONIA in the last 168 hours. CBC: Recent Labs  Lab 10/08/23 2203 10/09/23 0341  WBC 7.1  --   NEUTROABS 5.1  --   HGB 11.1* 9.5*  HCT 33.9* 28.4*  MCV 91.4  --   PLT 200  --    Cardiac Enzymes: No results for input(s): CKTOTAL, CKMB, CKMBINDEX, TROPONINI in the last 168 hours. BNP (last 3 results) No results for input(s): BNP in the last 8760 hours.  ProBNP (last 3 results) No results for input(s): PROBNP in the last 8760 hours.  CBG: No results for input(s): GLUCAP in the last 168 hours.  Radiological Exams on Admission: DG Chest Portable 1 View Result Date: 10/09/2023 CLINICAL DATA:  Hypoxia EXAM: PORTABLE CHEST 1 VIEW COMPARISON:  01/30/2023 FINDINGS: Low lung volumes. Heterogeneous airspace disease at left base suspicious for pneumonia. No pleural effusion. Normal cardiac size with aortic atherosclerosis. Old fracture deformity of the proximal left humerus IMPRESSION: Heterogeneous airspace disease at the left base suspicious for pneumonia. Electronically Signed   By: Luke Bun M.D.   On: 10/09/2023 02:11    Assessment/Plan ZENDAYAH HARDGRAVE is a 89 y.o. female with medical history significant for GERD, pancreatitis, hypothyroidism,  history of dizziness and orthostatic hypotension presents to the hospital with hematemesis and blood loss anemia.   Hematemesis and blood loss anemia-patient is hemodynamically stable, no prior history of GI bleeding, and not on any blood thinners.  Etiology includes but is not limited to gastritis, esophagitis, peptic ulcer disease, GAVE, etc. -Inpatient admission -Monitor on progressive -Trend hemoglobin every 8 hours -Transfuse as necessary to keep hemoglobin greater than 7 -IV PPI twice daily -Keep n.p.o. -[facility] has been consulted  Orthostatic hypotension-with syncope at home as well as in the emergency department.  She also has a history of chronic dizziness at baseline. -Use extreme caution with changes in position and ambulation -Blood pressure stable at rest, avoid antihypertensives and bolus fluids as needed  Hypothyroidism-Synthroid   Depression-Zoloft   Dementia-Aricept   DVT prophylaxis: SCDs only    Code Status: Limited: Do not attempt resuscitation (DNR) -DNR-LIMITED -Do Not Intubate/DNI this was confirmed with the patient at the time of admission.  Consults called: [facility]  Admission status: The appropriate patient status for this patient is INPATIENT. Inpatient status is judged to be reasonable and necessary in order to provide the required intensity of service to ensure the patient's safety. The patient's presenting symptoms, physical exam findings, and initial radiographic and laboratory data in the context of their chronic comorbidities is felt to place them at high risk for further clinical deterioration. Furthermore, it is not anticipated that the patient will be medically stable for discharge from the hospital within 2 midnights of admission.    I certify that at the point of admission it is my clinical judgment that the patient will  require inpatient hospital care spanning beyond 2 midnights from the point of admission due to high intensity of service, high risk for further deterioration and high frequency of surveillance required  Time spent: 59 minutes  Erika Conley CHRISTELLA Gail MD [facility] Pager [phone]  If 7PM-7AM, please contact night-coverage [email or web address] Password [facility]  10/09/2023, 9:18 AM

## 2023-10-09 NOTE — Transfer of Care (Signed)
 Immediate Anesthesia Transfer of Care Note  Patient: Erika Conley  Procedure(s) Performed: EGD (ESOPHAGOGASTRODUODENOSCOPY) INJECTION, SUBMUCOSAL CONTROL OF HEMORRHAGE, GI TRACT, ENDOSCOPIC, BY CLIPPING OR OVERSEWING  Patient Location: Endoscopy Unit  Anesthesia Type:MAC  Level of Consciousness: drowsy and patient cooperative  Airway & Oxygen Therapy: Patient Spontanous Breathing and Patient connected to face mask oxygen  Post-op Assessment: Report given to RN and Post -op Vital signs reviewed and stable  Post vital signs: Reviewed and stable  Last Vitals:  Vitals Value Taken Time  BP    Temp    Pulse 87 10/09/23 13:03  Resp 23 10/09/23 13:03  SpO2 96 % 10/09/23 13:03  Vitals shown include unfiled device data.  Last Pain:  Vitals:   10/09/23 1144  TempSrc: Temporal  PainSc: 6          Complications: No notable events documented.

## 2023-10-09 NOTE — ED Notes (Signed)
Pt transported to Endo 

## 2023-10-09 NOTE — ED Notes (Signed)
 Pt roomed after Endo procedure. Awaiting room assignment.  Added

## 2023-10-10 ENCOUNTER — Encounter (HOSPITAL_COMMUNITY): Payer: Self-pay | Admitting: Gastroenterology

## 2023-10-10 DIAGNOSIS — K922 Gastrointestinal hemorrhage, unspecified: Secondary | ICD-10-CM | POA: Diagnosis not present

## 2023-10-10 DIAGNOSIS — K3182 Dieulafoy lesion (hemorrhagic) of stomach and duodenum: Secondary | ICD-10-CM

## 2023-10-10 DIAGNOSIS — E039 Hypothyroidism, unspecified: Secondary | ICD-10-CM

## 2023-10-10 DIAGNOSIS — I951 Orthostatic hypotension: Secondary | ICD-10-CM | POA: Diagnosis not present

## 2023-10-10 DIAGNOSIS — D62 Acute posthemorrhagic anemia: Secondary | ICD-10-CM | POA: Diagnosis not present

## 2023-10-10 LAB — FERRITIN: Ferritin: 42 ng/mL (ref 11–307)

## 2023-10-10 LAB — BASIC METABOLIC PANEL WITH GFR
Anion gap: 7 (ref 5–15)
BUN: 18 mg/dL (ref 8–23)
CO2: 27 mmol/L (ref 22–32)
Calcium: 8.1 mg/dL — ABNORMAL LOW (ref 8.9–10.3)
Chloride: 105 mmol/L (ref 98–111)
Creatinine, Ser: 0.58 mg/dL (ref 0.44–1.00)
GFR, Estimated: >60 mL/min (ref >60)
Glucose, Bld: 104 mg/dL — ABNORMAL HIGH (ref 70–99)
Potassium: 3.7 mmol/L (ref 3.5–5.1)
Sodium: 139 mmol/L (ref 135–145)

## 2023-10-10 LAB — VITAMIN B12: Vitamin B-12: 112 pg/mL — ABNORMAL LOW (ref 180–914)

## 2023-10-10 LAB — HEMOGLOBIN AND HEMATOCRIT, BLOOD
HCT: 26.2 % — ABNORMAL LOW (ref 36.0–46.0)
HCT: 22.8 % — ABNORMAL LOW (ref 36.0–46.0)
Hemoglobin: 8.3 g/dL — ABNORMAL LOW (ref 12.0–15.0)
Hemoglobin: 7.4 g/dL — ABNORMAL LOW (ref 12.0–15.0)

## 2023-10-10 LAB — CBC
HCT: 23.9 % — ABNORMAL LOW (ref 36.0–46.0)
Hemoglobin: 7.9 g/dL — ABNORMAL LOW (ref 12.0–15.0)
MCH: 30.5 pg (ref 26.0–34.0)
MCHC: 33.1 g/dL (ref 30.0–36.0)
MCV: 92.3 fL (ref 80.0–100.0)
Platelets: 125 K/uL — ABNORMAL LOW (ref 150–400)
RBC: 2.59 MIL/uL — ABNORMAL LOW (ref 3.87–5.11)
RDW: 13.1 % (ref 11.5–15.5)
WBC: 8.6 K/uL (ref 4.0–10.5)
nRBC: 0 % (ref 0.0–0.2)

## 2023-10-10 LAB — IRON AND TIBC
Iron: 24 ug/dL — ABNORMAL LOW (ref 28–170)
Saturation Ratios: 9 % — ABNORMAL LOW (ref 10.4–31.8)
TIBC: 274 ug/dL (ref 250–450)
UIBC: 250 ug/dL

## 2023-10-10 LAB — FOLATE: Folate: 13.2 ng/mL (ref >5.9)

## 2023-10-10 MED ORDER — HYDROCODONE-ACETAMINOPHEN 5-325 MG PO TABS
1.0000 | ORAL_TABLET | Freq: Four times a day (QID) | ORAL | Status: DC | PRN
  Administered 2023-10-10 – 2023-10-16 (×10): 1 via ORAL
  Filled 2023-10-10 (×10): qty 1

## 2023-10-10 MED ORDER — DEXTROSE-SODIUM CHLORIDE 5-0.9 % IV SOLN: INTRAVENOUS | Status: AC

## 2023-10-10 NOTE — Progress Notes (Signed)
 [facility] Progress Note  CC:  Hematemesis    Subjective: Says that she feels awful today, but nothing new.  She reports nausea, abdominal pain, diarrhea, headache.  She says that these are always symptoms consistent with her IBS.  Says that she was told her stools are black.  Hemoglobin was 7.9 g overnight, but 8.3 g this morning so possibly old blood.  No more hematemesis.  Objective:  Vital signs in last 24 hours: Temp:  [97.9 F (36.6 C)-100.2 F (37.9 C)] 98.7 F (37.1 C) (08/20 0605) Pulse Rate:  [72-90] 87 (08/20 0605) Resp:  [15-25] 16 (08/20 0605) BP: (125-164)/(38-67) 127/46 (08/20 0605) SpO2:  [90 %-99 %] 95 % (08/20 0605) Weight:  [55.2 kg] 55.2 kg (08/19 1652) Last BM Date : 10/10/23 General:  Alert, elderly, in NAD Heart:  Regular rate and rhythm; no murmurs Pulm: Clear to auscultation bilaterally anteriorly. Abdomen:  Soft, non-distended.  Bowel sounds present.  Minimal diffuse tenderness to palpation. Extremities:  Without edema. Neurologic:  Alert and oriented x 4;  grossly normal neurologically. Psych:  Alert and cooperative. Normal mood and affect.  Intake/Output from previous day: 08/19 0701 - 08/20 0700 In: 700 [I.V.:700] Out: -   Lab Results: Recent Labs    10/08/23 2203 10/09/23 0341 10/09/23 1729 10/10/23 0103  WBC 7.1  --   --  8.6  HGB 11.1* 9.5* 8.8* 7.9*  HCT 33.9* 28.4* 26.3* 23.9*  PLT 200  --   --  125*   BMET Recent Labs    10/08/23 2203 10/10/23 0103  NA 131* 139  K 4.0 3.7  CL 99 105  CO2 24 27  GLUCOSE 171* 104*  BUN 33* 18  CREATININE 0.84 0.58  CALCIUM  7.9* 8.1*   LFT Recent Labs    10/08/23 2203  PROT 5.5*  ALBUMIN 3.3*  AST 18  ALT 10  ALKPHOS 55  BILITOT 0.5   PT/INR Recent Labs    10/09/23 0341  LABPROT 14.9  INR 1.1   DG Chest Portable 1 View Result Date: 10/09/2023 CLINICAL DATA:  Hypoxia EXAM: PORTABLE CHEST 1 VIEW COMPARISON:  01/30/2023 FINDINGS: Low lung volumes.  Heterogeneous airspace disease at left base suspicious for pneumonia. No pleural effusion. Normal cardiac size with aortic atherosclerosis. Old fracture deformity of the proximal left humerus IMPRESSION: Heterogeneous airspace disease at the left base suspicious for pneumonia. Electronically Signed   By: Luke Bun M.D.   On: 10/09/2023 02:11   Assessment / Plan: *Hematemesis with blood loss anemia-patient is hemodynamically stable, no prior history of GI bleeding, and not on any blood thinners.  Hemoglobin 9.5 g, down from 10.5 -11 g baseline.  BUN elevated at 33.  Hematemesis with blood loss anemia-patient is hemodynamically stable, no prior history of GI bleeding, and not on any blood thinners.  Hemoglobin 9.5 g, down from 10.5 -11 g baseline.  BUN elevated at 33.    EGD 8/19 with Dieulafoy lesion of stomach noted in the cardia. Epinephrine / fulguration via BiCap/ clipping performed. Tattoo placed adjacent.  *Aspiration PNA seen on chest x-ray likely occurred with vomiting.  Breathing status fine on [place].  - IV PPI until 8/21 PM. May transition to p. o. PPI thereafter.  - Carafate  1 g twice daily liquid slurry for 2 weeks. - Await path from gastric and duodenal biopsies. -Will advance to full liquids for now.     LOS: 1 day   Harlene BIRCH. Coran Dipaola  10/10/2023, 8:55 AM

## 2023-10-10 NOTE — Progress Notes (Signed)
 Progress Note   Patient: Erika Conley FMW:[id number] DOB: 1934-03-29 DOA: 10/08/2023     1 DOS: the patient was seen and examined on 10/10/2023   Brief hospital course: Erika Conley is a 89 y.o. female with medical history significant for GERD, pancreatitis, hypothyroidism, history of dizziness and orthostatic hypotension presents to the hospital with hematemesis and blood loss anemia.  She had syncopal event in ED, was orthostatic admitted to [facility] service with GI evaluation. She had EGD 10/09/23-Dieulafoy lesion of stomach noted in the cardia. Epinephrine /fulguration via BiCap/clipping performed. Granular and texture changed mucosa in the prepyloric region of the stomach. Biopsied.  Assessment and Plan: Acute blood loss anemia- Due to GI bleed- Continue to monitor H/H closely. Transfuse for Hb less than 7.  Upper GI bleeding- GI evaluation appreciated, s/p EGD 10/09/23-Dieulafoy lesion of stomach s/p fulguration, clipping. Clears for now, advance diet as tolerated.  She is eating poor. Continue gentle IV fluids. IV PPI twice daily. Monitor h/h closely.  Orthostatic hypotension- In the setting of GI bleed. She has chronic dizziness at baseline. Bedrest for now. Will get PT once Hb, vitals stable. Gentle IV fluids ordered.  Hypothyroidism- Continue home dose Synthroid .  Depression-on Zoloft .  Dementia-continue Aricept .      Out of bed to chair. Incentive spirometry. Nursing supportive care. Fall, aspiration precautions. Diet:  Diet Orders (From admission, onward)     Start     Ordered   10/10/23 1129  Diet full liquid Fluid consistency: Thin  Diet effective now       Question:  Fluid consistency:  Answer:  Thin   10/10/23 1128           DVT prophylaxis: SCDs Start: 10/09/23 0918  Level of care: Progressive   Code Status: Limited: Do not attempt resuscitation (DNR) -DNR-LIMITED -Do Not Intubate/DNI   Subjective: Patient is seen and examined today morning.  She  is weak did not get out of bed, eating poor.  Did mention about headache not getting better with Tylenol .  Physical Exam: Vitals:   10/09/23 1652 10/09/23 1950 10/10/23 0605 10/10/23 1111  BP:  136/60 (!) 127/46 (!) 123/50  Pulse:  82 87 77  Resp:  18 16 20   Temp:  98.3 F (36.8 C) 98.7 F (37.1 C) 99 F (37.2 C)  TempSrc:  Oral Oral Oral  SpO2:  98% 95% 94%  Weight: 55.2 kg     Height: 5' 3 (1.6 m)       General - Elderly Caucasian ill female, distress due to headache HEENT - PERRLA, EOMI, atraumatic head, non tender sinuses. Lung - Clear, no rales, rhonchi, wheezes. Heart - S1, S2 heard, no murmurs, rubs, no pedal edema. Abdomen - Soft, non tender, bowel sounds good Neuro - Alert, awake and oriented x 3, non focal exam. Skin - Warm and dry.  Data Reviewed:      Latest Ref Rng & Units 10/10/2023   10:02 AM 10/10/2023    1:03 AM 10/09/2023    5:29 PM  CBC  WBC 4.0 - 10.5 K/uL  8.6    Hemoglobin 12.0 - 15.0 g/dL 8.3  7.9  8.8   Hematocrit 36.0 - 46.0 % 26.2  23.9  26.3   Platelets 150 - 400 K/uL  125        Latest Ref Rng & Units 10/10/2023    1:03 AM 10/08/2023   10:03 PM 02/03/2023    3:51 AM  BMP  Glucose 70 - 99 mg/dL 895  171  95   BUN 8 - 23 mg/dL 18  33  16   Creatinine 0.44 - 1.00 mg/dL 9.41  9.15  9.36   Sodium 135 - 145 mmol/L 139  131  134   Potassium 3.5 - 5.1 mmol/L 3.7  4.0  3.6   Chloride 98 - 111 mmol/L 105  99  99   CO2 22 - 32 mmol/L 27  24  29    Calcium  8.9 - 10.3 mg/dL 8.1  7.9  7.8    DG Chest Portable 1 View Result Date: 10/09/2023 CLINICAL DATA:  Hypoxia EXAM: PORTABLE CHEST 1 VIEW COMPARISON:  01/30/2023 FINDINGS: Low lung volumes. Heterogeneous airspace disease at left base suspicious for pneumonia. No pleural effusion. Normal cardiac size with aortic atherosclerosis. Old fracture deformity of the proximal left humerus IMPRESSION: Heterogeneous airspace disease at the left base suspicious for pneumonia. Electronically Signed   By: Luke Bun M.D.   On: 10/09/2023 02:11    Family Communication: Discussed with patient, understand and agree. All questions answered.  Disposition: Status is: Inpatient Remains inpatient appropriate because: Monitor hemoglobin, vitals, PT eval  Planned Discharge Destination: Home with Home Health     Time spent: 47 minutes  Author: Concepcion Riser, MD 10/10/2023 12:43 PM Secure chat 7am to 7pm For on call review [email or web address].

## 2023-10-10 NOTE — TOC Initial Note (Signed)
 Transition of Care [facility]) - Initial/Assessment Note    Patient Details  Name: Erika Conley MRN: [id number] Date of Birth: 11-14-1934  Transition of Care [facility]) CM/SW Contact:    Bascom Service, RN Phone Number: 10/10/2023, 12:35 PM  Clinical Narrative: From [facility] for return.                  Expected Discharge Plan: Long Term Nursing Home Barriers to Discharge: Continued Medical Work up   Patient Goals and CMS Choice Patient states their goals for this hospitalization and ongoing recovery are:: Return back to [facility] [email or web address] Compare Post Acute Care list provided to:: Patient Represenative (must comment) (Ken(son)) Choice offered to / list presented to : Adult Children [employer] ownership interest in [facility].provided to:: Adult Children    Expected Discharge Plan and Services     Post Acute Care Choice: Nursing Home Living arrangements for the past 2 months: Post-Acute Facility                                      Prior Living Arrangements/Services Living arrangements for the past 2 months: Post-Acute Facility Lives with:: Facility Resident                   Activities of Daily Living   ADL Screening (condition at time of admission) Independently performs ADLs?: No Does the patient have a NEW difficulty with bathing/dressing/toileting/self-feeding that is expected to last >3 days?: Yes (Initiates electronic notice to provider for possible OT consult) Does the patient have a NEW difficulty with getting in/out of bed, walking, or climbing stairs that is expected to last >3 days?: Yes (Initiates electronic notice to provider for possible PT consult) Does the patient have a NEW difficulty with communication that is expected to last >3 days?: Yes (Initiates electronic notice to provider for possible SLP consult) Is the patient deaf or have difficulty hearing?: No Does the patient have difficulty seeing, even when wearing  glasses/contacts?: Yes Does the patient have difficulty concentrating, remembering, or making decisions?: No  Permission Sought/Granted                  Emotional Assessment              Admission diagnosis:  Orthostatic hypotension [I95.1] Upper GI bleed [K92.2] Urinary tract infection with hematuria, site unspecified [N39.0, R31.9] Aspiration pneumonia, unspecified aspiration pneumonia type, unspecified laterality, unspecified part of lung (HCC) [J69.0] Hematemesis, unspecified whether nausea present [K92.0] Patient Active Problem List   Diagnosis Date Noted   Upper GI bleed 10/09/2023   Acute blood loss anemia 10/09/2023   Upper GI bleeding 10/09/2023   Hematemesis 10/09/2023   Irritable bowel syndrome with diarrhea 02/03/2023   Ankle fracture 01/30/2023   Pressure injury of skin 04/16/2022   Lumbar vertebral fracture (HCC) 04/03/2022   Anxiety and depression 04/03/2022   HTN (hypertension) 04/03/2022   Dementia without behavioral disturbance (HCC) 04/03/2022   Traumatic compression fracture of L1 lumbar vertebra (HCC) 04/03/2022   Syncope and collapse 12/09/2018   Syncope 12/08/2018   Nausea and vomiting 12/14/2015   Hyponatremia 12/13/2015   Obstructive jaundice    Bile duct stricture    Pancreatitis 10/23/2015   Abdominal pain 10/22/2015   Pancreatic mass 10/22/2015   Common bile duct (CBD) obstruction    Abnormal LFTs    Dehydration with hyponatremia 04/09/2015   Acute  respiratory failure with hypoxia (HCC) 04/09/2015   Thrombocytopenia (HCC) 04/09/2015   Hypokalemia 11/10/2014   Hypertension 11/10/2014   Hypothyroidism 11/10/2014   Syncope due to orthostatic hypotension 11/10/2014   Constipated    UTI (urinary tract infection) 06/04/2013   Altered mental status 06/04/2013   PCP:  Freddrick, No Pharmacy:   [facility] [address] [place], [place] - [address] [place] [address] Phone: [phone] Fax: [phone]     Social  Drivers of Health (SDOH) Social History: SDOH Screenings   Food Insecurity: No Food Insecurity (10/09/2023)  Housing: Low Risk  (10/09/2023)  Transportation Needs: No Transportation Needs (10/09/2023)  Utilities: Not At Risk (10/09/2023)  Social Connections: Socially Isolated (10/09/2023)  Tobacco Use: Low Risk  (10/09/2023)   SDOH Interventions:     Readmission Risk Interventions    02/01/2023   12:43 PM  Readmission Risk Prevention Plan  Transportation Screening Complete  PCP or Specialist Appt within 5-7 Days Complete  Home Care Screening Complete  Medication Review (RN CM) Complete

## 2023-10-10 NOTE — Progress Notes (Signed)
 PT Cancellation Note  Patient Details Name: Erika Conley MRN: [id number] DOB: 09-22-1934   Cancelled Treatment:    Reason Eval/Treat Not Completed: Medical issues which prohibited therapy. Nurse indicated that pt is not medically stable to participate with therapy. Pt had syncope episode in ED and needs to remain in bed today. PT to continue to follow acutely.   [place], PT Acute Rehab   [place] VEAR Drone 10/10/2023, 12:05 PM

## 2023-10-10 NOTE — Progress Notes (Signed)
 OT Cancellation Note  Patient Details Name: Erika Conley MRN: [id number] DOB: 08/24/1934   Cancelled Treatment:    Reason Eval/Treat Not Completed: (P) Medical issues which prohibited therapy. RN states Pt passed out earlier, feeling dizzy, RN stated she should stay in bed today, will return tomorrow.   Erika Conley 10/10/2023, 11:33 AM

## 2023-10-11 ENCOUNTER — Inpatient Hospital Stay (HOSPITAL_COMMUNITY): Payer: Medicare (Managed Care)

## 2023-10-11 DIAGNOSIS — K3182 Dieulafoy lesion (hemorrhagic) of stomach and duodenum: Principal | ICD-10-CM

## 2023-10-11 DIAGNOSIS — K920 Hematemesis: Secondary | ICD-10-CM

## 2023-10-11 DIAGNOSIS — D62 Acute posthemorrhagic anemia: Secondary | ICD-10-CM | POA: Diagnosis not present

## 2023-10-11 DIAGNOSIS — R509 Fever, unspecified: Secondary | ICD-10-CM

## 2023-10-11 DIAGNOSIS — K922 Gastrointestinal hemorrhage, unspecified: Secondary | ICD-10-CM | POA: Diagnosis not present

## 2023-10-11 LAB — HEMOGLOBIN AND HEMATOCRIT, BLOOD
HCT: 22.6 % — ABNORMAL LOW (ref 36.0–46.0)
HCT: 23.2 % — ABNORMAL LOW (ref 36.0–46.0)
HCT: 23.4 % — ABNORMAL LOW (ref 36.0–46.0)
HCT: 26.3 % — ABNORMAL LOW (ref 36.0–46.0)
Hemoglobin: 7.2 g/dL — ABNORMAL LOW (ref 12.0–15.0)
Hemoglobin: 7.7 g/dL — ABNORMAL LOW (ref 12.0–15.0)
Hemoglobin: 8 g/dL — ABNORMAL LOW (ref 12.0–15.0)
Hemoglobin: 8.4 g/dL — ABNORMAL LOW (ref 12.0–15.0)

## 2023-10-11 LAB — RESP PANEL BY RT-PCR (RSV, FLU A&B, COVID)  RVPGX2
Influenza A by PCR: NEGATIVE
Influenza B by PCR: NEGATIVE
Resp Syncytial Virus by PCR: NEGATIVE
SARS Coronavirus 2 by RT PCR: NEGATIVE

## 2023-10-11 LAB — SURGICAL PATHOLOGY

## 2023-10-11 MED ORDER — ALUM & MAG HYDROXIDE-SIMETH 200-200-20 MG/5ML PO SUSP
30.0000 mL | ORAL | Status: DC | PRN
  Administered 2023-10-11 – 2023-10-14 (×4): 30 mL via ORAL
  Filled 2023-10-11 (×4): qty 30

## 2023-10-11 MED ORDER — CARMEX CLASSIC LIP BALM EX OINT
1.0000 | TOPICAL_OINTMENT | CUTANEOUS | Status: DC | PRN
  Filled 2023-10-11: qty 10

## 2023-10-11 NOTE — TOC Progression Note (Signed)
 Transition of Care [facility]) - Progression Note    Patient Details  Name: Erika Conley MRN: [id number] Date of Birth: 04/18/1934  Transition of Care [facility]) CM/SW Contact  Burnham Trost, Nathanel, RN Phone Number: 10/11/2023, 4:04 PM  Clinical Narrative:  Beatris to patient about d/c plans return back to [facility], DNR,blind. Patient provided updated info for primary contact person-Angie Feror(friend) [phone].     Expected Discharge Plan: Long Term Nursing Home Barriers to Discharge: Continued Medical Work up               Expected Discharge Plan and Services     Post Acute Care Choice: Nursing Home Living arrangements for the past 2 months: Post-Acute Facility                                       Social Drivers of Health (SDOH) Interventions SDOH Screenings   Food Insecurity: No Food Insecurity (10/09/2023)  Housing: Low Risk  (10/09/2023)  Transportation Needs: No Transportation Needs (10/09/2023)  Utilities: Not At Risk (10/09/2023)  Social Connections: Socially Isolated (10/09/2023)  Tobacco Use: Low Risk  (10/09/2023)    Readmission Risk Interventions    02/01/2023   12:43 PM  Readmission Risk Prevention Plan  Transportation Screening Complete  PCP or Specialist Appt within 5-7 Days Complete  Home Care Screening Complete  Medication Review (RN CM) Complete

## 2023-10-11 NOTE — Progress Notes (Signed)
 Progress Note   Patient: Erika Conley FMW:[id number] DOB: 04-18-1934 DOA: 10/08/2023     2 DOS: the patient was seen and examined on 10/11/2023   Brief hospital course: Erika Conley is a 89 y.o. female with medical history significant for GERD, pancreatitis, hypothyroidism, history of dizziness and orthostatic hypotension presents to the hospital with hematemesis and blood loss anemia.  She had syncopal event in ED, was orthostatic admitted to [facility] service with GI evaluation. She had EGD 10/09/23-Dieulafoy lesion of stomach noted in the cardia. Epinephrine /fulguration via BiCap/clipping performed. Her Hb low but stable. Feels weak, started eating little better today.  Assessment and Plan: Upper GI bleeding- GI evaluation appreciated, s/p EGD 10/09/23-Dieulafoy lesion of stomach s/p fulguration, clipping. She started eating today. Hold gentle IV fluids. IV PPI twice daily. Monitor h/h closely.  Acute blood loss anemia Due to GI bleed- Baseline Hb seems around 11, presented with 9.5 dropped to 7.2. Did not require PRBC. Hb 7-8, stable. Continue to monitor H/H closely. Transfuse for Hb less than 7.  Febrile illness- Check flu/covid panel. UA, chest xray ordered. Blood cultures x 2 Will hold off on abx for now.  Orthostatic hypotension- In the setting of GI bleed. She has chronic dizziness at baseline. Check orthostatics. PT/ OT evaluation.  Hypothyroidism- Continue home dose Synthroid .  Depression-on Zoloft .  Dementia-continue Aricept .      Out of bed to chair. Incentive spirometry. Nursing supportive care. Fall, aspiration precautions. Diet:  Diet Orders (From admission, onward)     Start     Ordered   10/10/23 1552  Diet Heart Fluid consistency: Thin  Diet effective now       Question:  Fluid consistency:  Answer:  Thin   10/10/23 1551           DVT prophylaxis: SCDs Start: 10/09/23 0918  Level of care: Progressive   Code Status: Limited: Do not attempt  resuscitation (DNR) -DNR-LIMITED -Do Not Intubate/DNI   Subjective: Patient is seen and examined today morning.  She feels weak, ate better today.  Tmax 101.4. Headache improved. Advised to work with PT.  Physical Exam: Vitals:   10/10/23 2216 10/11/23 0621 10/11/23 0953 10/11/23 1402  BP:  (!) 133/48 (!) 130/50 (!) 128/48  Pulse:  81 67 66  Resp:  16  16  Temp: 98.6 F (37 C) (!) 101.4 F (38.6 C) 99.4 F (37.4 C) 100.1 F (37.8 C)  TempSrc: Oral Oral Oral Oral  SpO2:  (!) 79% 90% 99%  Weight:      Height:        General - Elderly Caucasian ill female, no apparent distress. HEENT - PERRLA, EOMI, atraumatic head, non tender sinuses. Lung - Clear, no rales, rhonchi, wheezes. Heart - S1, S2 heard, no murmurs, rubs, no pedal edema. Abdomen - Soft, non tender, bowel sounds good Neuro - Alert, awake and oriented x 3, non focal exam. Skin - Warm and dry.  Data Reviewed:      Latest Ref Rng & Units 10/11/2023    2:45 PM 10/11/2023    6:59 AM 10/11/2023   12:13 AM  CBC  Hemoglobin 12.0 - 15.0 g/dL 7.7  8.4  7.2   Hematocrit 36.0 - 46.0 % 23.4  26.3  22.6       Latest Ref Rng & Units 10/10/2023    1:03 AM 10/08/2023   10:03 PM 02/03/2023    3:51 AM  BMP  Glucose 70 - 99 mg/dL 895  828  95  BUN 8 - 23 mg/dL 18  33  16   Creatinine 0.44 - 1.00 mg/dL 9.41  9.15  9.36   Sodium 135 - 145 mmol/L 139  131  134   Potassium 3.5 - 5.1 mmol/L 3.7  4.0  3.6   Chloride 98 - 111 mmol/L 105  99  99   CO2 22 - 32 mmol/L 27  24  29    Calcium  8.9 - 10.3 mg/dL 8.1  7.9  7.8    No results found.   Family Communication: Discussed with patient, understand and agree. All questions answered.  Disposition: Status is: Inpatient Remains inpatient appropriate because: Monitor hemoglobin, fever work up, PT eval  Planned Discharge Destination: Home with Home Health     Time spent: 45 minutes  Author: Concepcion Riser, MD 10/11/2023 3:15 PM Secure chat 7am to 7pm For on call review  [email or web address].

## 2023-10-11 NOTE — Evaluation (Signed)
 Physical Therapy Evaluation Patient Details Name: Erika Conley MRN: [id number] DOB: 1935/01/06 Today's Date: 10/11/2023  History of Present Illness  89 yo female presents to therapy following hospitalization on 10/08/2023 due to sudden onset of nausea and dizziness resulting in fall with emesis. Pt underwent EGD, injection of submucosal control of hemorrhage by clipping on 8/19 due to hematemesis and anemia. Pt dx with upper GI bleed. Pt PMH includes but is not limited to: anxiety, arthritis, depression, falls, fibromyalgia, GERD, HTN, vertigo, thyroid  dz, back surgery, and R ankle ORIF (2024).  Clinical Impression  Pt admitted with above diagnosis. Pt agreeable to session, states that she has had frquent therapy at her LTC and was able to reach the point of being cleared to walk a little, but endorses increased dizziness, hx of falls. She is able to sit EOB but dizziness worsens. Smooth pursuit testing completed with saccadic eye movements noted while holding gaze M-L and sup-inf. She returns to supine, reports voiding bladder. Pt transferred to the chair for cleaning and bedding change, tolerated poor and reports some difficulty breathing, nursing present and aware. Based on pt PLOF, level of support, and current functional status, pt will benefit from skilled inpatient physical therapy <3 hours per day at dc for safe progression of functional mobility. Pt currently with functional limitations due to the deficits listed below (see PT Problem List). Pt will benefit from acute skilled PT to increase their independence and safety with mobility to allow discharge.           If plan is discharge home, recommend the following: Two people to help with walking and/or transfers;A lot of help with bathing/dressing/bathroom;A lot of help with walking and/or transfers;Two people to help with bathing/dressing/bathroom;Assistance with cooking/housework;Assist for transportation   Can travel by private  vehicle   No    Equipment Recommendations None recommended by PT  Recommendations for Other Services       Functional Status Assessment Patient has had a recent decline in their functional status and demonstrates the ability to make significant improvements in function in a reasonable and predictable amount of time.     Precautions / Restrictions Precautions Precautions: Fall Recall of Precautions/Restrictions: Intact Restrictions Weight Bearing Restrictions Per Provider Order: No Other Position/Activity Restrictions: legally blind      Mobility  Bed Mobility Overal bed mobility: Needs Assistance Bed Mobility: Supine to Sit, Sit to Supine     Supine to sit: Contact guard Sit to supine: Min assist   General bed mobility comments: able to come to EOB with scooting and CGA for support, sit to supine completed with LE assist    Transfers Overall transfer level: Needs assistance Equipment used: None (front to front with bilateral axillary support for pt safety) Transfers: Sit to/from Stand, Bed to chair/wheelchair/BSC Sit to Stand: Min assist   Step pivot transfers: Mod assist       General transfer comment: pt has very limited tolerance to upright positioning. Pt had returned to supine from sitting due to not feeling well, voided bladder in bed and was transferred to chair to allow for clean bedding and to allow pt modified upright positioning OOB.    Ambulation/Gait                  Stairs            Wheelchair Mobility     Tilt Bed    Modified Rankin (Stroke Patients Only)       Balance Overall balance  assessment: Needs assistance Sitting-balance support: Feet supported, Bilateral upper extremity supported Sitting balance-Leahy Scale: Fair     Standing balance support: Bilateral upper extremity supported, During functional activity Standing balance-Leahy Scale: Poor                               Pertinent Vitals/Pain Pain  Assessment Pain Assessment: No/denies pain    Home Living Family/patient expects to be discharged to:: Assisted living                 Home Equipment: Rollator (4 wheels);Rolling Walker (2 wheels) Additional Comments: LTC resident    Prior Function Prior Level of Function : Needs assist             Mobility Comments: Ambulates to dining hall with rollator; reports frequent falls (hx of vertigo and orthostatic hypotension) ADLs Comments: Independent dressing and toileting; has assist with shower     Extremity/Trunk Assessment   Upper Extremity Assessment Upper Extremity Assessment: Defer to OT evaluation    Lower Extremity Assessment Lower Extremity Assessment: Generalized weakness    Cervical / Trunk Assessment Cervical / Trunk Assessment: Normal  Communication   Communication Communication: Impaired Factors Affecting Communication: Hearing impaired    Cognition Arousal: Alert Behavior During Therapy: WFL for tasks assessed/performed   PT - Cognitive impairments: No apparent impairments                         Following commands: Intact       Cueing Cueing Techniques: Verbal cues, Gestural cues, Tactile cues     General Comments General comments (skin integrity, edema, etc.): Pt has dizziness and light headedness sitting upright, returns to supine which improves symptoms, bladder voided in bed, pt transferred to chair. reports trouble breathing, nursing present for complaint and aware.    Exercises     Assessment/Plan    PT Assessment Patient needs continued PT services  PT Problem List Decreased strength;Decreased balance;Decreased mobility;Decreased coordination;Decreased activity tolerance       PT Treatment Interventions DME instruction;Functional mobility training;Balance training;Patient/family education;Gait training;Therapeutic activities;Therapeutic exercise    PT Goals (Current goals can be found in the Care Plan section)   Acute Rehab PT Goals Patient Stated Goal: decrease dizziness and return to walking PT Goal Formulation: With patient Time For Goal Achievement: 10/25/23 Potential to Achieve Goals: Good    Frequency Min 3X/week     Co-evaluation               AM-PAC PT 6 Clicks Mobility  Outcome Measure Help needed turning from your back to your side while in a flat bed without using bedrails?: A Little Help needed moving from lying on your back to sitting on the side of a flat bed without using bedrails?: A Little Help needed moving to and from a bed to a chair (including a wheelchair)?: A Lot Help needed standing up from a chair using your arms (e.g., wheelchair or bedside chair)?: A Little Help needed to walk in hospital room?: Total Help needed climbing 3-5 steps with a railing? : Total 6 Click Score: 13    End of Session   Activity Tolerance: Treatment limited secondary to medical complications (Comment) (dizziness and light headedness worsens with time in position) Patient left: in chair;with nursing/sitter in room;with call bell/phone within reach Nurse Communication: Mobility status PT Visit Diagnosis: Unsteadiness on feet (R26.81);Difficulty in walking, not elsewhere classified (R26.2);Dizziness  and giddiness (R42);Muscle weakness (generalized) (M62.81)    Time: 8950-8874 PT Time Calculation (min) (ACUTE ONLY): 36 min   Charges:   PT Evaluation $PT Eval Low Complexity: 1 Low PT Treatments $Therapeutic Activity: 8-22 mins PT General Charges $$ ACUTE PT VISIT: 1 Visit         Erika, PT Acute Rehabilitation Services Office: [phone] 10/11/2023   Erika Conley 10/11/2023, 11:54 AM

## 2023-10-11 NOTE — Evaluation (Signed)
 Occupational Therapy Evaluation Patient Details Name: Erika Conley MRN: [id number] DOB: July 26, 1934 Today's Date: 10/11/2023   History of Present Illness   89 yo female presents to therapy following hospitalization on 10/08/2023 due to sudden onset of nausea and dizziness resulting in fall with emesis. Pt underwent EGD, injection of submucosal control of hemorrhage by clipping on 8/19 due to hematemesis and anemia. Pt dx with upper GI bleed. Pt PMH includes but is not limited to: anxiety, arthritis, depression, falls, fibromyalgia, GERD, HTN, vertigo, thyroid  dz, back surgery, and R ankle ORIF (2024).     Clinical Impressions Pt seen for OT evaluation. Prior to admission, pt lives at LTC and has frequent falls/chronic dizziness. She was able to complete ADLs mod independent, requires assist for showering. Pt endorses frequent participation in PT/OT and was cleared to walk a little. Pt is legally blind & HOH at baseline. Pt completes bed mobility with CGA, poor tolerance to sitting EOB, requesting to return to supine with increased dizziness. Pt returns supine with MOD A, reports voiding bladder. Face-to-face step pivot transfer completed, MOD A for transfer and TOTAL A for pericare while pt turns due to dizziness symptoms. Poor tolerance overall, RN in room to assess IV site bleeding and pt reporting some difficulties breathing with RN present. Grooming tasks completed with setup. Orthostatics negative. Pt would benefit from skilled OT services to address noted impairments and functional limitations (see below for any additional details) in order to maximize safety and independence while minimizing falls risk and caregiver burden. Anticipate the need for follow up OT services upon acute hospital DC. Patient will benefit from continued inpatient follow up therapy, <3 hours/day   Orthostatic VS for the past 24 hrs (Last 3 readings):  BP- Lying Pulse- Lying BP- Sitting Pulse- Sitting  10/11/23 1527  132/49 65 141/59 72      If plan is discharge home, recommend the following:   A lot of help with walking and/or transfers;A lot of help with bathing/dressing/bathroom;Assistance with cooking/housework;Assistance with feeding;Direct supervision/assist for medications management;Direct supervision/assist for financial management;Assist for transportation;Help with stairs or ramp for entrance;Supervision due to cognitive status     Functional Status Assessment   Patient has had a recent decline in their functional status and demonstrates the ability to make significant improvements in function in a reasonable and predictable amount of time.     Equipment Recommendations   Other (comment) (defer)      Precautions/Restrictions   Precautions Precautions: Fall Recall of Precautions/Restrictions: Intact Restrictions Weight Bearing Restrictions Per Provider Order: No     Mobility Bed Mobility Overal bed mobility: Needs Assistance Bed Mobility: Supine to Sit, Sit to Supine     Supine to sit: Contact guard Sit to supine: Min assist   General bed mobility comments: able to come to EOB with scooting and CGA for support, sit to supine completed with LE assist    Transfers Overall transfer level: Needs assistance Equipment used: None Transfers: Sit to/from Stand, Bed to chair/wheelchair/BSC Sit to Stand: Min assist     Step pivot transfers: Mod assist     General transfer comment: Pt initally returned to supine due to not feeling well, voided bladder in bed, requiring pericare and bed linen change. SPT face to face transfer completed      Balance Overall balance assessment: Needs assistance Sitting-balance support: Feet supported, Bilateral upper extremity supported Sitting balance-Leahy Scale: Fair     Standing balance support: Bilateral upper extremity supported, During functional activity Standing balance-Leahy  Scale: Poor                              ADL either performed or assessed with clinical judgement   ADL Overall ADL's : Needs assistance/impaired Eating/Feeding: Set up;Sitting Eating/Feeding Details (indicate cue type and reason): recliner level, observed to drink from cup without difficulties Grooming: Wash/dry face;Wash/dry hands;Sitting Grooming Details (indicate cue type and reason): seated in recliner Upper Body Bathing: Minimal assistance;Sitting   Lower Body Bathing: Sitting/lateral leans;Maximal assistance   Upper Body Dressing : Sitting;Minimal assistance   Lower Body Dressing: Maximal assistance;Sit to/from stand   Toilet Transfer: Moderate assistance;Minimal assistance;Stand-pivot;Cueing for sequencing;Cueing for safety;BSC/3in1   Toileting- Clothing Manipulation and Hygiene: Total assistance;+2 for safety/equipment;+2 for physical assistance;Sit to/from stand Toileting - Clothing Manipulation Details (indicate cue type and reason): pt requires TOTAL A (limited tolerance to upright seated or standing, TOTAL A pericare while PT assists with SPT from bed > recliner after urine void bed level       General ADL Comments: pt has poor upright positioning tolerance. orthostatics assessed, negative     Vision Baseline Vision/History: 2 Legally blind Ability to See in Adequate Light: 2 Moderately impaired Patient Visual Report: No change from baseline              Pertinent Vitals/Pain Pain Assessment Pain Assessment: No/denies pain     Extremity/Trunk Assessment Upper Extremity Assessment Upper Extremity Assessment: Generalized weakness;Right hand dominant   Lower Extremity Assessment Lower Extremity Assessment: Generalized weakness   Cervical / Trunk Assessment Cervical / Trunk Assessment: Normal   Communication Communication Communication: Impaired Factors Affecting Communication: Hearing impaired   Cognition Arousal: Alert Behavior During Therapy: WFL for tasks assessed/performed Cognition:  Difficult to assess             OT - Cognition Comments: difficult to assess cognitive status - pt with flucuating levels of alertness, often closing eyes and reporting feeling unwell or dizzy                 Following commands: Intact       Cueing  General Comments   Cueing Techniques: Verbal cues;Gestural cues;Tactile cues  RN in room to assess R IV site bleeding, notified of pt's difficulties breathing while upright in recliner (assessed SpO2 on RA, WNL), notified of vital signs, orthostatics (-) pt with continued c/o dizziness and poor tolerance to upright.   Exercises     Shoulder Instructions      Home Living Family/patient expects to be discharged to:: Assisted living                             Home Equipment: Rollator (4 wheels);Rolling Walker (2 wheels)   Additional Comments: LTC resident      Prior Functioning/Environment Prior Level of Function : Needs assist             Mobility Comments: Ambulates to dining hall with rollator; reports frequent falls (hx of vertigo and orthostatic hypotension) ADLs Comments: Independent dressing and toileting; has assist with shower    OT Problem List: Decreased strength;Decreased range of motion;Decreased activity tolerance;Impaired balance (sitting and/or standing);Impaired vision/perception;Decreased coordination;Decreased knowledge of precautions;Decreased knowledge of use of DME or AE   OT Treatment/Interventions: Self-care/ADL training;Therapeutic exercise;Neuromuscular education;Energy conservation;DME and/or AE instruction;Therapeutic activities;Cognitive remediation/compensation;Visual/perceptual remediation/compensation;Patient/family education;Balance training      OT Goals(Current goals can be found in the care plan section)  Acute Rehab OT Goals OT Goal Formulation: With patient Time For Goal Achievement: 10/25/23 Potential to Achieve Goals: Fair   OT Frequency:  Min 2X/week        AM-PAC OT 6 Clicks Daily Activity     Outcome Measure Help from another person eating meals?: A Little Help from another person taking care of personal grooming?: A Little Help from another person toileting, which includes using toliet, bedpan, or urinal?: A Lot Help from another person bathing (including washing, rinsing, drying)?: A Lot Help from another person to put on and taking off regular upper body clothing?: A Lot Help from another person to put on and taking off regular lower body clothing?: A Lot 6 Click Score: 14   End of Session Nurse Communication: Mobility status  Activity Tolerance: Patient tolerated treatment well Patient left: in chair;with call bell/phone within reach;with chair alarm set;with nursing/sitter in room  OT Visit Diagnosis: Unsteadiness on feet (R26.81);Other abnormalities of gait and mobility (R26.89);Repeated falls (R29.6);Muscle weakness (generalized) (M62.81);Dizziness and giddiness (R42);History of falling (Z91.81)                Time: 8950-8864 OT Time Calculation (min): 46 min Charges:  OT General Charges $OT Visit: 1 Visit OT Evaluation $OT Eval Low Complexity: 1 Low OT Treatments $Self Care/Home Management : 23-37 mins  Kynzi Levay L. Alexsis Kathman, OTR/L  10/11/23, 3:55 PM

## 2023-10-11 NOTE — Evaluation (Addendum)
 SLP Cancellation Note  Patient Details Name: Erika Conley MRN: [id number] DOB: 06-12-1934   Cancelled treatment:       Reason Eval/Treat Not Completed: Other (comment) (pt to return to SNF and is legally blind and has dementia, has support needed at SNF)  [name], MS [facility] SLP Acute Rehab Services Office [phone]  [name] 10/11/2023, 5:31 PM

## 2023-10-12 ENCOUNTER — Ambulatory Visit: Payer: Self-pay | Admitting: Gastroenterology

## 2023-10-12 DIAGNOSIS — E063 Autoimmune thyroiditis: Secondary | ICD-10-CM

## 2023-10-12 DIAGNOSIS — K3182 Dieulafoy lesion (hemorrhagic) of stomach and duodenum: Secondary | ICD-10-CM | POA: Diagnosis not present

## 2023-10-12 DIAGNOSIS — F0390 Unspecified dementia without behavioral disturbance: Secondary | ICD-10-CM

## 2023-10-12 DIAGNOSIS — K922 Gastrointestinal hemorrhage, unspecified: Secondary | ICD-10-CM | POA: Diagnosis not present

## 2023-10-12 DIAGNOSIS — J189 Pneumonia, unspecified organism: Secondary | ICD-10-CM | POA: Insufficient documentation

## 2023-10-12 LAB — HEMOGLOBIN AND HEMATOCRIT, BLOOD
HCT: 25.1 % — ABNORMAL LOW (ref 36.0–46.0)
HCT: 23.8 % — ABNORMAL LOW (ref 36.0–46.0)
Hemoglobin: 8.2 g/dL — ABNORMAL LOW (ref 12.0–15.0)
Hemoglobin: 7.9 g/dL — ABNORMAL LOW (ref 12.0–15.0)

## 2023-10-12 LAB — URINALYSIS, COMPLETE (UACMP) WITH MICROSCOPIC
Bacteria, UA: NONE SEEN
Bilirubin Urine: NEGATIVE
Glucose, UA: NEGATIVE mg/dL
Hgb urine dipstick: NEGATIVE
Ketones, ur: NEGATIVE mg/dL
Nitrite: NEGATIVE
Protein, ur: NEGATIVE mg/dL
Specific Gravity, Urine: 1.006 (ref 1.005–1.030)
pH: 6 (ref 5.0–8.0)

## 2023-10-12 MED ORDER — PANTOPRAZOLE SODIUM 40 MG PO TBEC
40.0000 mg | DELAYED_RELEASE_TABLET | Freq: Two times a day (BID) | ORAL | Status: DC
  Administered 2023-10-12 – 2023-10-16 (×8): 40 mg via ORAL
  Filled 2023-10-12 (×8): qty 1

## 2023-10-12 MED ORDER — SODIUM CHLORIDE 0.9 % IV SOLN
2.0000 g | INTRAVENOUS | Status: DC
  Administered 2023-10-12: 2 g via INTRAVENOUS
  Filled 2023-10-12: qty 20

## 2023-10-12 MED ORDER — SODIUM CHLORIDE 0.9 % IV SOLN
100.0000 mg | Freq: Two times a day (BID) | INTRAVENOUS | Status: DC
  Administered 2023-10-12 – 2023-10-13 (×2): 100 mg via INTRAVENOUS
  Filled 2023-10-12 (×3): qty 100

## 2023-10-12 MED ORDER — IPRATROPIUM-ALBUTEROL 0.5-2.5 (3) MG/3ML IN SOLN
3.0000 mL | Freq: Two times a day (BID) | RESPIRATORY_TRACT | Status: DC
  Administered 2023-10-12 – 2023-10-13 (×2): 3 mL via RESPIRATORY_TRACT
  Filled 2023-10-12 (×2): qty 3

## 2023-10-12 NOTE — NC FL2 (Signed)
 [place]  MEDICAID FL2 LEVEL OF CARE FORM     IDENTIFICATION  Patient Name: Erika Conley Birthdate: Sep 20, 1934 Sex: female Admission Date (Current Location): 10/08/2023  [place] and [place] Number:    [id number] Facility and Address:  [facility],  [address], [place] [address]      Provider Number: [id number]  Attending Physician Name and Address:  Kenard Zachary PARAS, MD  Relative Name and Phone Number:  Angie Feror(friend) [phone]    Current Level of Care: Hospital Recommended Level of Care: Nursing Facility Prior Approval Number:    Date Approved/Denied:   PASRR Number:    Discharge Plan: Other (Comment)    Current Diagnoses: Patient Active Problem List   Diagnosis Date Noted   Dieulafoy lesion (hemorrhagic) of stomach and duodenum 10/10/2023   Upper GI bleed 10/09/2023   Acute blood loss anemia 10/09/2023   Upper GI bleeding 10/09/2023   Hematemesis 10/09/2023   Irritable bowel syndrome with diarrhea 02/03/2023   Ankle fracture 01/30/2023   Pressure injury of skin 04/16/2022   Lumbar vertebral fracture (HCC) 04/03/2022   Anxiety and depression 04/03/2022   HTN (hypertension) 04/03/2022   Dementia without behavioral disturbance (HCC) 04/03/2022   Traumatic compression fracture of L1 lumbar vertebra (HCC) 04/03/2022   Syncope and collapse 12/09/2018   Syncope 12/08/2018   Nausea and vomiting 12/14/2015   Hyponatremia 12/13/2015   Obstructive jaundice    Bile duct stricture    Pancreatitis 10/23/2015   Abdominal pain 10/22/2015   Pancreatic mass 10/22/2015   Common bile duct (CBD) obstruction    Abnormal LFTs    Dehydration with hyponatremia 04/09/2015   Acute respiratory failure with hypoxia (HCC) 04/09/2015   Thrombocytopenia (HCC) 04/09/2015   Hypokalemia 11/10/2014   Hypertension 11/10/2014   Hypothyroidism 11/10/2014   Syncope due to orthostatic hypotension 11/10/2014   Constipated    UTI (urinary tract infection)  06/04/2013   Altered mental status 06/04/2013    Orientation RESPIRATION BLADDER Height & Weight     Self, Time, Situation, Place    Continent Weight: 55.2 kg Height:  5' 3 (160 cm)  BEHAVIORAL SYMPTOMS/MOOD NEUROLOGICAL BOWEL NUTRITION STATUS      Continent Diet (Heart healthy)  AMBULATORY STATUS COMMUNICATION OF NEEDS Skin   Extensive Assist Verbally Normal                       Personal Care Assistance Level of Assistance  Bathing, Feeding, Dressing Bathing Assistance: Limited assistance Feeding assistance: Limited assistance Dressing Assistance: Limited assistance     Functional Limitations Info  Sight, Hearing, Speech Sight Info: Impaired (Blind) Hearing Info: Adequate Speech Info: Adequate    SPECIAL CARE FACTORS FREQUENCY  PT (By licensed PT), OT (By licensed OT)     PT Frequency: 2x week OT Frequency: 2x week            Contractures Contractures Info: Not present    Additional Factors Info  Code Status, Allergies Code Status Info: DNR Allergies Info: Beta Sitosterol (Sitosterols), Blueberry (Vaccinium Angustifolium), Chocolate, Codeine, Cortisone, Guggulipid-black Pepper, Morphine , Nsaids, Other           Current Medications (10/12/2023):  This is the current hospital active medication list Current Facility-Administered Medications  Medication Dose Route Frequency Provider Last Rate Last Admin   acetaminophen  (TYLENOL ) tablet 650 mg  650 mg Oral Q6H PRN Zella Katha HERO, MD   650 mg at 10/12/23 9043   Or   acetaminophen  (TYLENOL ) suppository  650 mg  650 mg Rectal Q6H PRN Zella, Mir M, MD       albuterol  (PROVENTIL ) (2.5 MG/3ML) 0.083% nebulizer solution 2.5 mg  2.5 mg Nebulization Q2H PRN Zella, Mir M, MD       alum & mag hydroxide-simeth (MAALOX/MYLANTA) 200-200-20 MG/5ML suspension 30 mL  30 mL Oral Q4H PRN Darci Pore, MD   30 mL at 10/11/23 1527   diphenhydrAMINE  (BENADRYL ) capsule 25 mg  25 mg Oral Q6H PRN Zella Katha HERO, MD   25 mg at 10/11/23 1732   donepezil  (ARICEPT ) tablet 10 mg  10 mg Oral QHS Zella, Mir M, MD   10 mg at 10/11/23 2203   feeding supplement (BOOST / RESOURCE BREEZE) liquid 1 Container  1 Container Oral TID BM Zella Katha HERO, MD   1 Container at 10/12/23 [phone]   fluticasone  (FLONASE ) 50 MCG/ACT nasal spray 1 spray  1 spray Each Nare Daily Zella, Mir M, MD   1 spray at 10/12/23 0940   HYDROcodone -acetaminophen  (NORCO/VICODIN) 5-325 MG per tablet 1 tablet  1 tablet Oral Q6H PRN Sreeram, Narendranath, MD   1 tablet at 10/11/23 1527   lactated ringers  bolus 1,000 mL  1,000 mL Intravenous Once Francis Ileana SAILOR, PA-C       levothyroxine  (SYNTHROID ) tablet 100 mcg  100 mcg Oral Q0600 Zella, Mir M, MD   100 mcg at 10/12/23 0543   lip balm (CARMEX) ointment 1 Application  1 Application Topical PRN Sreeram, Narendranath, MD       ondansetron  (ZOFRAN ) tablet 4 mg  4 mg Oral Q6H PRN Zella, Mir M, MD   4 mg at 10/10/23 0540   Or   ondansetron  (ZOFRAN ) injection 4 mg  4 mg Intravenous Q6H PRN Zella Katha HERO, MD   4 mg at 10/09/23 1516   pantoprazole  (PROTONIX ) injection 40 mg  40 mg Intravenous Q12H Zella, Mir M, MD   40 mg at 10/12/23 9060   senna-docusate (Senokot-S) tablet 1 tablet  1 tablet Oral QHS PRN Zella, Mir M, MD       sertraline  (ZOLOFT ) tablet 200 mg  200 mg Oral QHS Zella, Mir M, MD   200 mg at 10/11/23 2203   sucralfate  (CARAFATE ) 1 GM/10ML suspension 1 g  1 g Oral BID Mansouraty, Gabriel Jr., MD   1 g at 10/12/23 9060   traZODone  (DESYREL ) tablet 150 mg  150 mg Oral QHS Zella, Mir M, MD   150 mg at 10/11/23 2203   traZODone  (DESYREL ) tablet 25 mg  25 mg Oral QHS PRN Zella Katha HERO, MD         Discharge Medications: Please see discharge summary for a list of discharge medications.  Relevant Imaging Results:  Relevant Lab Results:   Additional Information [id number]  Gaetan Spieker, Nathanel, RN

## 2023-10-12 NOTE — Assessment & Plan Note (Signed)
 No further evidence of bleeding Hemoglobin and hematocrit are stable Avoiding anticoagulants Transitioning PPI to oral twice daily Continuing Carafate  Monitoring hemoglobin and hematocrit with serial CBCs

## 2023-10-12 NOTE — Progress Notes (Signed)
 Physical Therapy Treatment Patient Details Name: Erika Conley MRN: [id number] DOB: [birth year] Today's Date: 10/12/2023   History of Present Illness 89 yo female presents to therapy following hospitalization on 10/08/2023 due to sudden onset of nausea and dizziness resulting in fall with emesis. Pt underwent EGD, injection of submucosal control of hemorrhage by clipping on 8/19 due to hematemesis and anemia. Pt dx with upper GI bleed. Pt PMH includes but is not limited to: anxiety, arthritis, depression, falls, fibromyalgia, GERD, HTN, vertigo, thyroid  dz, back surgery, and R ankle ORIF (2024).    PT Comments  Pt sleeping on arrival and easily awakened but reports overall not feeling well but unable to describe.  Pt agreeable to attempt mobility.  Pt assisted with sitting EOB however slumped inward and reports feeling dizzy and needing to lay down.  Pt assisted back to supine and repositioned.   Pt states dizziness feels like her vertigo but she is poor historian.  She also kept eyes closed most of the time and when asked to open, she states, you know I'm blind right?  She denies taking Meclizine  or antivert  for her vertigo.  Unable to further assess dizzy symptoms.  Pt encouraged to try bed in chair position later with nursing to hopefully improve upright posture tolerance (also discussed with RN), however pt declined more elevated HOB at end of session.  Per notes, pt from LTC facility and to return at d/c.    If plan is discharge home, recommend the following: Assistance with cooking/housework;Assist for transportation;A lot of help with walking and/or transfers;A lot of help with bathing/dressing/bathroom   Can travel by private vehicle     No  Equipment Recommendations  Wheelchair (measurements PT);Wheelchair cushion (measurements PT)    Recommendations for Other Services       Precautions / Restrictions Precautions Precautions: Fall Precaution/Restrictions Comments: legally blind,  hx vertigo     Mobility  Bed Mobility Overal bed mobility: Needs Assistance Bed Mobility: Supine to Sit, Sit to Supine     Supine to sit: Min assist Sit to supine: Min assist   General bed mobility comments: assist for trunk upright and LEs onto bed; pt not able to tolerate sitting    Transfers                        Ambulation/Gait                   Stairs             Wheelchair Mobility     Tilt Bed    Modified Rankin (Stroke Patients Only)       Balance                                            Communication Communication Communication: Impaired Factors Affecting Communication: Hearing impaired  Cognition Arousal: Alert Behavior During Therapy: Flat affect   PT - Cognitive impairments: No apparent impairments                         Following commands: Intact      Cueing Cueing Techniques: Verbal cues, Gestural cues, Tactile cues  Exercises      General Comments        Pertinent Vitals/Pain Pain Assessment Pain Assessment: No/denies pain    Home Living  Prior Function            PT Goals (current goals can now be found in the care plan section) Progress towards PT goals: Not progressing toward goals - comment    Frequency    Min 1X/week      PT Plan      Co-evaluation              AM-PAC PT 6 Clicks Mobility   Outcome Measure  Help needed turning from your back to your side while in a flat bed without using bedrails?: A Little Help needed moving from lying on your back to sitting on the side of a flat bed without using bedrails?: A Little Help needed moving to and from a bed to a chair (including a wheelchair)?: A Lot Help needed standing up from a chair using your arms (e.g., wheelchair or bedside chair)?: A Lot Help needed to walk in hospital room?: Total Help needed climbing 3-5 steps with a railing? : Total 6 Click Score:  12    End of Session   Activity Tolerance: Treatment limited secondary to medical complications (Comment) (pt reports overall not feeling well, reluctant to mobilize) Patient left: in bed;with call bell/phone within reach;with bed alarm set Nurse Communication: Mobility status PT Visit Diagnosis: Unsteadiness on feet (R26.81);Difficulty in walking, not elsewhere classified (R26.2)     Time: 1101-1110 PT Time Calculation (min) (ACUTE ONLY): 9 min  Charges:    $Therapeutic Activity: 8-22 mins PT General Charges $$ ACUTE PT VISIT: 1 Visit                    Tari PT, DPT Physical Therapist Acute Rehabilitation Services Office: [phone]    Tari CROME Payson 10/12/2023, 1:51 PM

## 2023-10-12 NOTE — Assessment & Plan Note (Signed)
 Continue Aricept

## 2023-10-12 NOTE — Plan of Care (Signed)

## 2023-10-12 NOTE — Assessment & Plan Note (Signed)
 Continuing  levothyroxine

## 2023-10-12 NOTE — Assessment & Plan Note (Signed)
 Infectious workup pursued due to recurrent fevers Chest x-ray revealing left lower lobe infiltrate concerning for pneumonia Initiated on intravenous ceftriaxone  and azithromycin 

## 2023-10-12 NOTE — TOC Progression Note (Signed)
 Transition of Care [facility]) - Progression Note    Patient Details  Name: Erika Conley MRN: [id number] Date of Birth: 08/20/1934  [facility] CM/SW Contact  Davi Rotan, Nathanel, RN Phone Number: 10/12/2023, 12:20 PM  Clinical Narrative: Per [facility] rep Leontine can d/c over weekend if stable.      Expected Discharge Plan: Long Term Nursing Home Barriers to Discharge: Continued Medical Work up               Expected Discharge Plan and Services     Post Acute Care Choice: Nursing Home Living arrangements for the past 2 months: Post-Acute Facility                                       Social Drivers of Health (SDOH) Interventions SDOH Screenings   Food Insecurity: No Food Insecurity (10/09/2023)  Housing: Low Risk  (10/09/2023)  Transportation Needs: No Transportation Needs (10/09/2023)  Utilities: Not At Risk (10/09/2023)  Social Connections: Socially Isolated (10/09/2023)  Tobacco Use: Low Risk  (10/09/2023)    Readmission Risk Interventions    02/01/2023   12:43 PM  Readmission Risk Prevention Plan  Transportation Screening Complete  PCP or Specialist Appt within 5-7 Days Complete  Home Care Screening Complete  Medication Review (RN CM) Complete

## 2023-10-12 NOTE — Progress Notes (Signed)
 PROGRESS NOTE   Erika Conley  FMW:[id number] DOB: [birth year] DOA: 10/08/2023 PCP: Pcp, No   Date of Service: the patient was seen and examined on 10/12/2023  Brief Narrative:  89 y.o. female with medical history significant for GERD, pancreatitis, hypothyroidism, history of dizziness and orthostatic hypotension presents to the hospital with hematemesis.  Upon being evaluated in the emergency department patient exhibited an episode of syncope.  Hospitalist was called to assess the patient for admission to the hospital due to concerns for upper gastrointestinal hemorrhage.    Gastroenterology was consulted and patient underwent an EGD on 10/09/23.  EGD revealed a Dieulafoy lesion of stomach noted in the cardia. Epinephrine /fulguration via BiCap/clipping performed.   There were no immediate complicatios with the procedure but in the days that followed patient began to exhibit recurrent fevers with chest x-ray revealing pneumonia of the left lower lobe.  Patient was started on intravenous antibiotics.   Assessment & Plan Upper GI bleed Dieulafoy lesion of stomach Acute blood loss anemia Hematemesis No further evidence of bleeding Hemoglobin and hematocrit are stable Avoiding anticoagulants Transitioning PPI to oral twice daily Continuing Carafate  Monitoring hemoglobin and hematocrit with serial CBCs Pneumonia of left lower lobe due to infectious organism Infectious workup pursued due to recurrent fevers Chest x-ray revealing left lower lobe infiltrate concerning for pneumonia Initiated on intravenous ceftriaxone  and azithromycin  Hypothyroidism Continuing levothyroxine  Dementia without behavioral disturbance (HCC) Continue Aricept      Subjective:  Patient complaining of generalized malaise and worsening nonproductive cough.  Patient denies abdominal pain or any evidence of bleeding.  Physical Exam:  Vitals:   10/12/23 1543 10/12/23 1552 10/12/23 2007 10/12/23 2057  BP:    (!) 149/55   Pulse:   71   Resp:   16   Temp:   (!) 100.4 F (38 C)   TempSrc:   Oral   SpO2: 92% 92% 99% 99%  Weight:      Height:        Constitutional: Awake alert and oriented x3, no associated distress.   Skin: no rashes, no lesions, good skin turgor noted. Eyes: Pupils are equally reactive to light.  No evidence of scleral icterus or conjunctival pallor.  ENMT: Moist mucous membranes noted.  Posterior pharynx clear of any exudate or lesions.   Respiratory: Mild bibasilar rales., no wheezing, no crackles. Normal respiratory effort. No accessory muscle use.  Cardiovascular: Regular rate and rhythm, no murmurs / rubs / gallops. No extremity edema. 2+ pedal pulses. No carotid bruits.  Abdomen: Abdomen is soft and nontender.  No evidence of intra-abdominal masses.  Positive bowel sounds noted in all quadrants.   Musculoskeletal: No joint deformity upper and lower extremities. Good ROM, no contractures. Normal muscle tone.    Data Reviewed:  I have personally reviewed and interpreted labs, imaging.  Significant findings are   CBC: Recent Labs  Lab 10/08/23 2203 10/09/23 0341 10/10/23 0103 10/10/23 1002 10/11/23 0659 10/11/23 1445 10/11/23 2239 10/12/23 0656 10/12/23 1440  WBC 7.1  --  8.6  --   --   --   --   --   --   NEUTROABS 5.1  --   --   --   --   --   --   --   --   HGB 11.1*   < > 7.9*   < > 8.4* 7.7* 8.0* 8.2* 7.9*  HCT 33.9*   < > 23.9*   < > 26.3* 23.4* 23.2* 25.1* 23.8*  MCV 91.4  --  92.3  --   --   --   --   --   --   PLT 200  --  125*  --   --   --   --   --   --    < > = values in this interval not displayed.   Basic Metabolic Panel: Recent Labs  Lab 10/08/23 2203 10/10/23 0103  NA 131* 139  K 4.0 3.7  CL 99 105  CO2 24 27  GLUCOSE 171* 104*  BUN 33* 18  CREATININE 0.84 0.58  CALCIUM  7.9* 8.1*   GFR: Estimated Creatinine Clearance: 41.8 mL/min (by C-G formula based on SCr of 0.58 mg/dL). Liver Function Tests: Recent Labs  Lab  10/08/23 2203  AST 18  ALT 10  ALKPHOS 55  BILITOT 0.5  PROT 5.5*  ALBUMIN 3.3*    Coagulation Profile: Recent Labs  Lab 10/09/23 0341  INR 1.1     Code Status:  DNR.  Code status decision has been confirmed with: patient Family Communication: none    Severity of Illness:  The appropriate patient status for this patient is INPATIENT. Inpatient status is judged to be reasonable and necessary in order to provide the required intensity of service to ensure the patient's safety. The patient's presenting symptoms, physical exam findings, and initial radiographic and laboratory data in the context of their chronic comorbidities is felt to place them at high risk for further clinical deterioration. Furthermore, it is not anticipated that the patient will be medically stable for discharge from the hospital within 2 midnights of admission.   * I certify that at the point of admission it is my clinical judgment that the patient will require inpatient hospital care spanning beyond 2 midnights from the point of admission due to high intensity of service, high risk for further deterioration and high frequency of surveillance required.*  Time spent:  51 minutes  Author:  Zachary JINNY Ba MD  10/12/2023 9:30 PM

## 2023-10-12 NOTE — Hospital Course (Addendum)
 89 y.o. female with medical history significant for GERD, pancreatitis, hypothyroidism, history of dizziness and orthostatic hypotension presents to the hospital with hematemesis.  Upon being evaluated in the emergency department patient exhibited an episode of syncope.  Hospitalist was called to assess the patient for admission to the hospital due to concerns for upper gastrointestinal hemorrhage.    Gastroenterology was consulted and patient underwent an EGD on 10/09/23.  EGD revealed a Dieulafoy lesion of stomach noted in the cardia. Epinephrine /fulguration via BiCap/clipping performed.   There were no immediate complications with the procedure but in the days that followed patient began to exhibit recurrent fevers with chest x-ray revealing pneumonia of the left lower lobe.  Patient was started on intravenous antibiotics on 8/22.  Hospital course also complicated by profound weakness to the point where patient is unable to get up out of bed.  Considering patient exhibited episode of syncope in the emergency department transfusion was offered.  Patient refused on 8/23 but eventually agreed on 8/24.  Patient undergoing 1 unit packed of blood cell transfusion.

## 2023-10-13 DIAGNOSIS — J189 Pneumonia, unspecified organism: Secondary | ICD-10-CM

## 2023-10-13 DIAGNOSIS — K922 Gastrointestinal hemorrhage, unspecified: Secondary | ICD-10-CM | POA: Diagnosis not present

## 2023-10-13 DIAGNOSIS — K3182 Dieulafoy lesion (hemorrhagic) of stomach and duodenum: Secondary | ICD-10-CM | POA: Diagnosis not present

## 2023-10-13 DIAGNOSIS — D500 Iron deficiency anemia secondary to blood loss (chronic): Secondary | ICD-10-CM | POA: Insufficient documentation

## 2023-10-13 DIAGNOSIS — D519 Vitamin B12 deficiency anemia, unspecified: Secondary | ICD-10-CM | POA: Insufficient documentation

## 2023-10-13 DIAGNOSIS — K920 Hematemesis: Secondary | ICD-10-CM | POA: Diagnosis not present

## 2023-10-13 DIAGNOSIS — F0390 Unspecified dementia without behavioral disturbance: Secondary | ICD-10-CM | POA: Diagnosis not present

## 2023-10-13 LAB — CBC WITH DIFFERENTIAL/PLATELET
Abs Immature Granulocytes: 0.01 K/uL (ref 0.00–0.07)
Basophils Absolute: 0 K/uL (ref 0.0–0.1)
Basophils Relative: 1 %
Eosinophils Absolute: 0.1 K/uL (ref 0.0–0.5)
Eosinophils Relative: 2 %
HCT: 22.3 % — ABNORMAL LOW (ref 36.0–46.0)
Hemoglobin: 7.4 g/dL — ABNORMAL LOW (ref 12.0–15.0)
Immature Granulocytes: 0 %
Lymphocytes Relative: 16 %
Lymphs Abs: 0.6 K/uL — ABNORMAL LOW (ref 0.7–4.0)
MCH: 30.7 pg (ref 26.0–34.0)
MCHC: 33.2 g/dL (ref 30.0–36.0)
MCV: 92.5 fL (ref 80.0–100.0)
Monocytes Absolute: 0.4 K/uL (ref 0.1–1.0)
Monocytes Relative: 11 %
Neutro Abs: 2.5 K/uL (ref 1.7–7.7)
Neutrophils Relative %: 70 %
Platelets: 142 K/uL — ABNORMAL LOW (ref 150–400)
RBC: 2.41 MIL/uL — ABNORMAL LOW (ref 3.87–5.11)
RDW: 12.9 % (ref 11.5–15.5)
WBC: 3.5 K/uL — ABNORMAL LOW (ref 4.0–10.5)
nRBC: 0 % (ref 0.0–0.2)

## 2023-10-13 LAB — COMPREHENSIVE METABOLIC PANEL WITH GFR
ALT: 6 U/L (ref 0–44)
AST: 10 U/L — ABNORMAL LOW (ref 15–41)
Albumin: 2.6 g/dL — ABNORMAL LOW (ref 3.5–5.0)
Alkaline Phosphatase: 45 U/L (ref 38–126)
Anion gap: 7 (ref 5–15)
BUN: 12 mg/dL (ref 8–23)
CO2: 29 mmol/L (ref 22–32)
Calcium: 7.8 mg/dL — ABNORMAL LOW (ref 8.9–10.3)
Chloride: 104 mmol/L (ref 98–111)
Creatinine, Ser: 0.68 mg/dL (ref 0.44–1.00)
GFR, Estimated: >60 mL/min (ref >60)
Glucose, Bld: 103 mg/dL — ABNORMAL HIGH (ref 70–99)
Potassium: 3.5 mmol/L (ref 3.5–5.1)
Sodium: 140 mmol/L (ref 135–145)
Total Bilirubin: 0.4 mg/dL (ref 0.0–1.2)
Total Protein: 4.8 g/dL — ABNORMAL LOW (ref 6.5–8.1)

## 2023-10-13 LAB — MAGNESIUM: Magnesium: 1.9 mg/dL (ref 1.7–2.4)

## 2023-10-13 MED ORDER — AMOXICILLIN-POT CLAVULANATE 875-125 MG PO TABS
1.0000 | ORAL_TABLET | Freq: Two times a day (BID) | ORAL | Status: DC
  Administered 2023-10-13 – 2023-10-16 (×6): 1 via ORAL
  Filled 2023-10-13 (×6): qty 1

## 2023-10-13 MED ORDER — ALBUTEROL SULFATE (2.5 MG/3ML) 0.083% IN NEBU: 2.5000 mg | INHALATION_SOLUTION | RESPIRATORY_TRACT | Status: DC | PRN

## 2023-10-13 MED ORDER — FERROUS SULFATE 325 (65 FE) MG PO TABS
325.0000 mg | ORAL_TABLET | Freq: Every day | ORAL | Status: DC
  Administered 2023-10-14 – 2023-10-16 (×3): 325 mg via ORAL
  Filled 2023-10-13 (×3): qty 1

## 2023-10-13 MED ORDER — MELATONIN 5 MG PO TABS
5.0000 mg | ORAL_TABLET | Freq: Every day | ORAL | Status: DC
  Administered 2023-10-13 – 2023-10-15 (×3): 5 mg via ORAL
  Filled 2023-10-13 (×3): qty 1

## 2023-10-13 MED ORDER — VITAMIN B-12 1000 MCG PO TABS
1000.0000 ug | ORAL_TABLET | Freq: Every day | ORAL | Status: DC
  Administered 2023-10-14 – 2023-10-16 (×3): 1000 ug via ORAL
  Filled 2023-10-13 (×3): qty 1

## 2023-10-13 MED ORDER — CYANOCOBALAMIN 1000 MCG/ML IJ SOLN
1000.0000 ug | Freq: Once | INTRAMUSCULAR | Status: AC
  Administered 2023-10-13: 1000 ug via INTRAMUSCULAR
  Filled 2023-10-13: qty 1

## 2023-10-13 MED ORDER — DOXYCYCLINE HYCLATE 100 MG PO TABS
100.0000 mg | ORAL_TABLET | Freq: Two times a day (BID) | ORAL | Status: DC
  Administered 2023-10-13 – 2023-10-16 (×6): 100 mg via ORAL
  Filled 2023-10-13 (×6): qty 1

## 2023-10-13 NOTE — Assessment & Plan Note (Signed)
 Likely exacerbating anemia and patient's feelings of generalized weakness Initiating intramuscular vitamin B12 followed by daily oral therapy

## 2023-10-13 NOTE — Assessment & Plan Note (Signed)
 No further evidence of bleeding Hemoglobin and hematocrit are stable Avoiding anticoagulants Transitioning PPI to oral twice daily Continuing Carafate  Monitoring hemoglobin and hematocrit with serial CBCs Transfuse if hemoglobin goes below 7 Now on daily iron therapy

## 2023-10-13 NOTE — Assessment & Plan Note (Signed)
 Continue Aricept

## 2023-10-13 NOTE — Progress Notes (Addendum)
 PROGRESS NOTE   Erika Conley  FMW:[id number] DOB: [birth year] DOA: 10/08/2023 PCP: Pcp, No   Date of Service: the patient was seen and examined on 10/13/2023  Brief Narrative:  89 y.o. female with medical history significant for GERD, pancreatitis, hypothyroidism, history of dizziness and orthostatic hypotension presents to the hospital with hematemesis.  Upon being evaluated in the emergency department patient exhibited an episode of syncope.  Hospitalist was called to assess the patient for admission to the hospital due to concerns for upper gastrointestinal hemorrhage.    Gastroenterology was consulted and patient underwent an EGD on 10/09/23.  EGD revealed a Dieulafoy lesion of stomach noted in the cardia. Epinephrine /fulguration via BiCap/clipping performed.   There were no immediate complicatios with the procedure but in the days that followed patient began to exhibit recurrent fevers with chest x-ray revealing pneumonia of the left lower lobe.  Patient was started on intravenous antibiotics.   Assessment & Plan Upper GI bleed Dieulafoy lesion of stomach Acute blood loss anemia Hematemesis Iron deficiency anemia due to chronic blood loss No further evidence of bleeding Hemoglobin and hematocrit are stable Avoiding anticoagulants Transitioning PPI to oral twice daily Continuing Carafate  Monitoring hemoglobin and hematocrit with serial CBCs Transfuse if hemoglobin goes below 7 Now on daily iron therapy Pneumonia of left lower lobe due to infectious organism Fevers improving Chest x-ray 8/22 revealing left lower lobe infiltrate concerning for pneumonia Transitioning from intravenous ceftriaxone  azithromycin  to oral Augmentin  and doxycycline  today. Hypothyroidism Continuing levothyroxine  Dementia without behavioral disturbance (HCC) Continue Aricept  Vitamin B12 deficiency anemia Likely exacerbating anemia and patient's feelings of generalized weakness Initiating  intramuscular vitamin B12 followed by daily oral therapy    Subjective:  Patient states that her cough is improved but she continues to experience severe generalized weakness.  Patient denies associated shortness of breath.     Physical Exam:  Vitals:   10/12/23 2057 10/13/23 0426 10/13/23 0836 10/13/23 1346  BP:  (!) 146/55  122/77  Pulse:  60  70  Resp:  16  18  Temp:  98.4 F (36.9 C)  97.9 F (36.6 C)  TempSrc:  Oral  Oral  SpO2: 99% 100% 96%   Weight:      Height:        Constitutional: Awake alert and oriented x3, no associated distress.   Skin: Numerous ecchymoses over the bilateral upper extremities with poor skin turgor. Eyes: Pupils are equally reactive to light.  Increased conjunctival pallor.   ENMT: Moist mucous membranes noted.  Posterior pharynx clear of any exudate or lesions.   Respiratory: Mild bibasilar rales., no wheezing, no crackles. Normal respiratory effort. No accessory muscle use.  Cardiovascular: Regular rate and rhythm, no murmurs / rubs / gallops. No extremity edema. 2+ pedal pulses. No carotid bruits.  Abdomen: Abdomen is soft and nontender.  No evidence of intra-abdominal masses.  Positive bowel sounds noted in all quadrants.   Musculoskeletal: No joint deformity upper and lower extremities. Good ROM, no contractures. Normal muscle tone.    Data Reviewed:  I have personally reviewed and interpreted labs, imaging.  Significant findings are   CBC: Recent Labs  Lab 10/08/23 2203 10/09/23 0341 10/10/23 0103 10/10/23 1002 10/11/23 1445 10/11/23 2239 10/12/23 0656 10/12/23 1440 10/13/23 0557  WBC 7.1  --  8.6  --   --   --   --   --  3.5*  NEUTROABS 5.1  --   --   --   --   --   --   --  2.5  HGB 11.1*   < > 7.9*   < > 7.7* 8.0* 8.2* 7.9* 7.4*  HCT 33.9*   < > 23.9*   < > 23.4* 23.2* 25.1* 23.8* 22.3*  MCV 91.4  --  92.3  --   --   --   --   --  92.5  PLT 200  --  125*  --   --   --   --   --  142*   < > = values in this interval not  displayed.   Basic Metabolic Panel: Recent Labs  Lab 10/08/23 2203 10/10/23 0103 10/13/23 0557  NA 131* 139 140  K 4.0 3.7 3.5  CL 99 105 104  CO2 24 27 29   GLUCOSE 171* 104* 103*  BUN 33* 18 12  CREATININE 0.84 0.58 0.68  CALCIUM  7.9* 8.1* 7.8*  MG  --   --  1.9   GFR: Estimated Creatinine Clearance: 41.8 mL/min (by C-G formula based on SCr of 0.68 mg/dL). Liver Function Tests: Recent Labs  Lab 10/08/23 2203 10/13/23 0557  AST 18 10*  ALT 10 6  ALKPHOS 55 45  BILITOT 0.5 0.4  PROT 5.5* 4.8*  ALBUMIN 3.3* 2.6*    Coagulation Profile: Recent Labs  Lab 10/09/23 0341  INR 1.1     Code Status:  DNR.  Code status decision has been confirmed with: patient Family Communication: none    Severity of Illness:  The appropriate patient status for this patient is INPATIENT. Inpatient status is judged to be reasonable and necessary in order to provide the required intensity of service to ensure the patient's safety. The patient's presenting symptoms, physical exam findings, and initial radiographic and laboratory data in the context of their chronic comorbidities is felt to place them at high risk for further clinical deterioration. Furthermore, it is not anticipated that the patient will be medically stable for discharge from the hospital within 2 midnights of admission.   * I certify that at the point of admission it is my clinical judgment that the patient will require inpatient hospital care spanning beyond 2 midnights from the point of admission due to high intensity of service, high risk for further deterioration and high frequency of surveillance required.*  Time spent:  40 minutes  Author:  Zachary JINNY Ba MD  10/13/2023 7:47 PM

## 2023-10-13 NOTE — Progress Notes (Signed)
 MD notified that patient lost IV access this morning this morning due to it starting to swell up. RN put in an order for IV team to come and put in a new PIV but patient refused. RN is aware that patient has scheduled IV antibiotics and issues with orthostatics. RN wanted to reach out to MD since RN did not know what MD wanted to do as far as the IV antibiotics as patient refused IV placement.

## 2023-10-13 NOTE — Assessment & Plan Note (Signed)
 Continuing  levothyroxine

## 2023-10-13 NOTE — Plan of Care (Signed)

## 2023-10-13 NOTE — Progress Notes (Signed)
 A consult was placed to the IV Nurse for new IV access; pt upset, showing her arms with multiple bruises and bandaids;  pt stated  I do not want another iv.   RN aware;  no attempts made.

## 2023-10-13 NOTE — Assessment & Plan Note (Signed)
 Fevers improving Chest x-ray 8/22 revealing left lower lobe infiltrate concerning for pneumonia Transitioning from intravenous ceftriaxone  azithromycin  to oral Augmentin  and doxycycline  today.

## 2023-10-14 ENCOUNTER — Inpatient Hospital Stay (HOSPITAL_COMMUNITY): Payer: Medicare (Managed Care)

## 2023-10-14 DIAGNOSIS — K3182 Dieulafoy lesion (hemorrhagic) of stomach and duodenum: Secondary | ICD-10-CM | POA: Diagnosis not present

## 2023-10-14 DIAGNOSIS — E063 Autoimmune thyroiditis: Secondary | ICD-10-CM | POA: Diagnosis not present

## 2023-10-14 DIAGNOSIS — I951 Orthostatic hypotension: Secondary | ICD-10-CM | POA: Insufficient documentation

## 2023-10-14 DIAGNOSIS — F0390 Unspecified dementia without behavioral disturbance: Secondary | ICD-10-CM | POA: Diagnosis not present

## 2023-10-14 DIAGNOSIS — K922 Gastrointestinal hemorrhage, unspecified: Secondary | ICD-10-CM | POA: Diagnosis not present

## 2023-10-14 LAB — CBC WITH DIFFERENTIAL/PLATELET
Abs Immature Granulocytes: 0.01 K/uL (ref 0.00–0.07)
Basophils Absolute: 0 K/uL (ref 0.0–0.1)
Basophils Relative: 1 %
Eosinophils Absolute: 0.1 K/uL (ref 0.0–0.5)
Eosinophils Relative: 3 %
HCT: 22.5 % — ABNORMAL LOW (ref 36.0–46.0)
Hemoglobin: 7.4 g/dL — ABNORMAL LOW (ref 12.0–15.0)
Immature Granulocytes: 0 %
Lymphocytes Relative: 32 %
Lymphs Abs: 1.1 K/uL (ref 0.7–4.0)
MCH: 30.2 pg (ref 26.0–34.0)
MCHC: 32.9 g/dL (ref 30.0–36.0)
MCV: 91.8 fL (ref 80.0–100.0)
Monocytes Absolute: 0.4 K/uL (ref 0.1–1.0)
Monocytes Relative: 11 %
Neutro Abs: 1.8 K/uL (ref 1.7–7.7)
Neutrophils Relative %: 53 %
Platelets: 156 K/uL (ref 150–400)
RBC: 2.45 MIL/uL — ABNORMAL LOW (ref 3.87–5.11)
RDW: 13.1 % (ref 11.5–15.5)
WBC: 3.5 K/uL — ABNORMAL LOW (ref 4.0–10.5)
nRBC: 0 % (ref 0.0–0.2)

## 2023-10-14 LAB — HEMOGLOBIN AND HEMATOCRIT, BLOOD
HCT: 28 % — ABNORMAL LOW (ref 36.0–46.0)
Hemoglobin: 9.2 g/dL — ABNORMAL LOW (ref 12.0–15.0)

## 2023-10-14 LAB — COMPREHENSIVE METABOLIC PANEL WITH GFR
ALT: 5 U/L (ref 0–44)
AST: 12 U/L — ABNORMAL LOW (ref 15–41)
Albumin: 2.5 g/dL — ABNORMAL LOW (ref 3.5–5.0)
Alkaline Phosphatase: 44 U/L (ref 38–126)
Anion gap: 10 (ref 5–15)
BUN: 12 mg/dL (ref 8–23)
CO2: 26 mmol/L (ref 22–32)
Calcium: 7.9 mg/dL — ABNORMAL LOW (ref 8.9–10.3)
Chloride: 102 mmol/L (ref 98–111)
Creatinine, Ser: 0.7 mg/dL (ref 0.44–1.00)
GFR, Estimated: >60 mL/min (ref >60)
Glucose, Bld: 90 mg/dL (ref 70–99)
Potassium: 3.7 mmol/L (ref 3.5–5.1)
Sodium: 138 mmol/L (ref 135–145)
Total Bilirubin: 0.4 mg/dL (ref 0.0–1.2)
Total Protein: 4.6 g/dL — ABNORMAL LOW (ref 6.5–8.1)

## 2023-10-14 LAB — MAGNESIUM: Magnesium: 2.1 mg/dL (ref 1.7–2.4)

## 2023-10-14 LAB — PREPARE RBC (CROSSMATCH)

## 2023-10-14 MED ORDER — POLYETHYLENE GLYCOL 3350 17 G PO PACK
17.0000 g | PACK | Freq: Every day | ORAL | Status: DC
  Administered 2023-10-14 – 2023-10-15 (×2): 17 g via ORAL
  Filled 2023-10-14 (×3): qty 1

## 2023-10-14 MED ORDER — SODIUM CHLORIDE 0.9% IV SOLUTION: Freq: Once | INTRAVENOUS | Status: AC

## 2023-10-14 MED ORDER — HYDROXYZINE HCL 25 MG PO TABS
25.0000 mg | ORAL_TABLET | Freq: Four times a day (QID) | ORAL | Status: DC | PRN
  Administered 2023-10-14 – 2023-10-15 (×3): 25 mg via ORAL
  Filled 2023-10-14 (×3): qty 1

## 2023-10-14 NOTE — Assessment & Plan Note (Signed)
 Fevers exhibited earlier in the hospitalization resolved. Chest x-ray 8/22 revealing left lower lobe infiltrate concerning for pneumonia Initially was treated with ceftriaxone  and azithromycin , now on oral Augmentin  and doxycycline  as of 8/23.

## 2023-10-14 NOTE — TOC Progression Note (Addendum)
 Transition of Care [facility]) - Progression Note    Patient Details  Name: LATAUSHA FLAMM MRN: [id number] Date of Birth: [birth year]  Transition of Care [facility]) CM/SW Contact  Sonda Manuella Quill, RN Phone Number: 10/14/2023, 11:27 AM  Clinical Narrative:    Notified by Dr Kenard pt may d/c back to long-term care at [facility] today; spoke w/ [facility] Admissions at facility; she said pt can return today; she said d/c summary needed before 3 PM because pharmacy closes at that time; she said report can be called to [phone]; RM # given when report called; Leontine also requested D/C summary be faxed to [phone] and [phone]; Dr Kenard notified via secure chat.   -1450- notified by Dr Kenard pt will not d/c today; [place] at facility notified. Expected Discharge Plan: Long Term Nursing Home Barriers to Discharge: Continued Medical Work up               Expected Discharge Plan and Services     Post Acute Care Choice: Nursing Home Living arrangements for the past 2 months: Post-Acute Facility                                       Social Drivers of Health (SDOH) Interventions SDOH Screenings   Food Insecurity: No Food Insecurity (10/09/2023)  Housing: Low Risk  (10/09/2023)  Transportation Needs: No Transportation Needs (10/09/2023)  Utilities: Not At Risk (10/09/2023)  Social Connections: Socially Isolated (10/09/2023)  Tobacco Use: Low Risk  (10/09/2023)    Readmission Risk Interventions    02/01/2023   12:43 PM  Readmission Risk Prevention Plan  Transportation Screening Complete  PCP or Specialist Appt within 5-7 Days Complete  Home Care Screening Complete  Medication Review (RN CM) Complete

## 2023-10-14 NOTE — Assessment & Plan Note (Signed)
 Continue Aricept

## 2023-10-14 NOTE — Assessment & Plan Note (Signed)
 No further evidence of bleeding Hemoglobin and hematocrit are stable but quite a bit below baseline Undergoing 1 unit packed red blood cells infusion today. Avoiding anticoagulants Continuing PPI to oral twice daily Continuing Carafate  Monitoring hemoglobin and hematocrit with serial CBCs Transfuse if hemoglobin goes below 7 Now on daily iron therapy

## 2023-10-14 NOTE — Assessment & Plan Note (Signed)
 Likely exacerbating anemia and patient's feelings of generalized weakness Initiated intramuscular vitamin B12 on 8/23 followed by daily oral therapy

## 2023-10-14 NOTE — Assessment & Plan Note (Signed)
 Episode of witnessed syncope in the emergency department Likely orthostatic in the setting of bleed/low hemoglobin

## 2023-10-14 NOTE — Progress Notes (Signed)
 MD notified that patient complaining of 7/10 mid abdominal pain that is constant. RN told MD that RN gave patient maalox twice because it appeared that patient was having indigestion but patient said it did not really help. RN just gave patient PRN Norco and PRN Zofran  to see if that might help. MD placed new orders.

## 2023-10-14 NOTE — Progress Notes (Signed)
   10/14/23 1651  Orthostatic Lying   BP- Lying 150/62  Pulse- Lying 59  Orthostatic Sitting  BP- Sitting 163/70  Pulse- Sitting 62  Orthostatic Standing at 0 minutes  BP- Standing at 0 minutes  (unable to complete; patient could not tolerate (felt too dizzy))  Pulse- Standing at 0 minutes  (unable to complete; patient could not tolerate (felt too dizzy))   MD notified. Will pass along to night shift to reattempt.

## 2023-10-14 NOTE — Progress Notes (Addendum)
 PROGRESS NOTE   Erika Conley  FMW:[id number] DOB: 1934-03-28 DOA: 10/08/2023 PCP: Pcp, No   Date of Service: the patient was seen and examined on 10/14/2023  Brief Narrative:  89 y.o. female with medical history significant for GERD, pancreatitis, hypothyroidism, history of dizziness and orthostatic hypotension presents to the hospital with hematemesis.  Upon being evaluated in the emergency department patient exhibited an episode of syncope.  Hospitalist was called to assess the patient for admission to the hospital due to concerns for upper gastrointestinal hemorrhage.    Gastroenterology was consulted and patient underwent an EGD on 10/09/23.  EGD revealed a Dieulafoy lesion of stomach noted in the cardia. Epinephrine /fulguration via BiCap/clipping performed.   There were no immediate complications with the procedure but in the days that followed patient began to exhibit recurrent fevers with chest x-ray revealing pneumonia of the left lower lobe.  Patient was started on intravenous antibiotics on 8/22.  Hospital course also complicated by profound weakness to the point where patient is unable to get up out of bed.  Considering patient exhibited episode of syncope in the emergency department transfusion was offered.  Patient refused on 8/23 but eventually agreed on 8/24.  Patient undergoing 1 unit packed of blood cell transfusion.   Assessment & Plan Upper GI bleed Dieulafoy lesion of stomach Acute blood loss anemia Hematemesis Iron deficiency anemia due to chronic blood loss No further evidence of bleeding Hemoglobin and hematocrit are stable but quite a bit below baseline Undergoing 1 unit packed red blood cells infusion today. Avoiding anticoagulants Continuing PPI to oral twice daily Continuing Carafate  Monitoring hemoglobin and hematocrit with serial CBCs Transfuse if hemoglobin goes below 7 Now on daily iron therapy Pneumonia of left lower lobe due to infectious  organism Fevers exhibited earlier in the hospitalization resolved. Chest x-ray 8/22 revealing left lower lobe infiltrate concerning for pneumonia Initially was treated with ceftriaxone  and azithromycin , now on oral Augmentin  and doxycycline  as of 8/23. Orthostatic syncope Episode of witnessed syncope in the emergency department Likely orthostatic in the setting of bleed/low hemoglobin Hypothyroidism Continuing levothyroxine  Dementia without behavioral disturbance (HCC) Continue Aricept  Vitamin B12 deficiency anemia Likely exacerbating anemia and patient's feelings of generalized weakness Initiated intramuscular vitamin B12 on 8/23 followed by daily oral therapy    Subjective:  Patient states that her cough is improved but she continues to experience severe generalized weakness.  Additionally complaining of associated lightheadedness when attempting to stand up.  Physical Exam:  Vitals:   10/14/23 0455 10/14/23 1145 10/14/23 1209 10/14/23 1457  BP: (!) 138/46 (!) 151/59 (!) 151/53 (!) 125/44  Pulse: (!) 52 (!) 58 (!) 57 68  Resp: 20 (!) 22 14 17   Temp: 97.9 F (36.6 C) 98.4 F (36.9 C) 98.7 F (37.1 C) 98.2 F (36.8 C)  TempSrc: Oral Oral Oral Oral  SpO2: 100% 98% 93% 94%  Weight:      Height:        Constitutional: Awake alert and oriented x3, no associated distress.   Skin: Numerous ecchymoses over the bilateral upper extremities with poor skin turgor. Eyes: Pupils are equally reactive to light.  Increased conjunctival pallor.  ENMT: Moist mucous membranes noted.  Posterior pharynx clear of any exudate or lesions.   Respiratory: Mild bibasilar rales., no wheezing, no crackles. Normal respiratory effort. No accessory muscle use.  Cardiovascular: Regular rate and rhythm, no murmurs / rubs / gallops. No extremity edema. 2+ pedal pulses. No carotid bruits.  Abdomen: Abdomen is soft and nontender.  No evidence of intra-abdominal masses.  Positive bowel sounds noted in all  quadrants.   Musculoskeletal: No joint deformity upper and lower extremities. Good ROM, no contractures.  Poor muscle tone   Data Reviewed:  I have personally reviewed and interpreted labs, imaging.  Significant findings are   CBC: Recent Labs  Lab 10/08/23 2203 10/09/23 0341 10/10/23 0103 10/10/23 1002 10/11/23 2239 10/12/23 0656 10/12/23 1440 10/13/23 0557 10/14/23 0524  WBC 7.1  --  8.6  --   --   --   --  3.5* 3.5*  NEUTROABS 5.1  --   --   --   --   --   --  2.5 1.8  HGB 11.1*   < > 7.9*   < > 8.0* 8.2* 7.9* 7.4* 7.4*  HCT 33.9*   < > 23.9*   < > 23.2* 25.1* 23.8* 22.3* 22.5*  MCV 91.4  --  92.3  --   --   --   --  92.5 91.8  PLT 200  --  125*  --   --   --   --  142* 156   < > = values in this interval not displayed.   Basic Metabolic Panel: Recent Labs  Lab 10/08/23 2203 10/10/23 0103 10/13/23 0557 10/14/23 0524  NA 131* 139 140 138  K 4.0 3.7 3.5 3.7  CL 99 105 104 102  CO2 24 27 29 26   GLUCOSE 171* 104* 103* 90  BUN 33* 18 12 12   CREATININE 0.84 0.58 0.68 0.70  CALCIUM  7.9* 8.1* 7.8* 7.9*  MG  --   --  1.9 2.1   GFR: Estimated Creatinine Clearance: 41.8 mL/min (by C-G formula based on SCr of 0.7 mg/dL). Liver Function Tests: Recent Labs  Lab 10/08/23 2203 10/13/23 0557 10/14/23 0524  AST 18 10* 12*  ALT 10 6 5   ALKPHOS 55 45 44  BILITOT 0.5 0.4 0.4  PROT 5.5* 4.8* 4.6*  ALBUMIN 3.3* 2.6* 2.5*    Coagulation Profile: Recent Labs  Lab 10/09/23 0341  INR 1.1     Code Status:  DNR.  Code status decision has been confirmed with: patient Family Communication: none    Severity of Illness:  The appropriate patient status for this patient is INPATIENT. Inpatient status is judged to be reasonable and necessary in order to provide the required intensity of service to ensure the patient's safety. The patient's presenting symptoms, physical exam findings, and initial radiographic and laboratory data in the context of their chronic comorbidities  is felt to place them at high risk for further clinical deterioration. Furthermore, it is not anticipated that the patient will be medically stable for discharge from the hospital within 2 midnights of admission.   * I certify that at the point of admission it is my clinical judgment that the patient will require inpatient hospital care spanning beyond 2 midnights from the point of admission due to high intensity of service, high risk for further deterioration and high frequency of surveillance required.*  Time spent:  51 minutes  Author:  Zachary JINNY Ba MD  10/14/2023 5:56 PM

## 2023-10-14 NOTE — Assessment & Plan Note (Signed)
 Continuing  levothyroxine

## 2023-10-15 DIAGNOSIS — K922 Gastrointestinal hemorrhage, unspecified: Secondary | ICD-10-CM | POA: Diagnosis not present

## 2023-10-15 LAB — CBC
HCT: 28.1 % — ABNORMAL LOW (ref 36.0–46.0)
Hemoglobin: 9.2 g/dL — ABNORMAL LOW (ref 12.0–15.0)
MCH: 29.5 pg (ref 26.0–34.0)
MCHC: 32.7 g/dL (ref 30.0–36.0)
MCV: 90.1 fL (ref 80.0–100.0)
Platelets: 159 K/uL (ref 150–400)
RBC: 3.12 MIL/uL — ABNORMAL LOW (ref 3.87–5.11)
RDW: 14 % (ref 11.5–15.5)
WBC: 3.4 K/uL — ABNORMAL LOW (ref 4.0–10.5)
nRBC: 0 % (ref 0.0–0.2)

## 2023-10-15 LAB — TYPE AND SCREEN
ABO/RH(D): A NEG
Antibody Screen: NEGATIVE
Unit division: 0

## 2023-10-15 LAB — BPAM RBC
Blood Product Expiration Date: 202509202359
ISSUE DATE / TIME: 202508241145
Unit Type and Rh: 600

## 2023-10-15 MED ORDER — DIPHENHYDRAMINE HCL 25 MG PO CAPS
25.0000 mg | ORAL_CAPSULE | Freq: Once | ORAL | Status: AC
  Administered 2023-10-15: 25 mg via ORAL
  Filled 2023-10-15: qty 1

## 2023-10-15 NOTE — Progress Notes (Signed)
 PROGRESS NOTE    Erika Conley  FMW:[id number] DOB: [birth year] DOA: 10/08/2023 PCP: Pcp, No    Brief Narrative:  89 year old with history of GERD, hypothyroidism, chronic orthostatic hypotension from assisted living facility presented to the hospital with hematemesis.  Also evidence of orthostatic syncope.  In the emergency room hemodynamically stable.  Also there was concern for aspiration.  Patient was started on IV Protonix , IV antibiotics, fluid and admitted to hospital with GI consultation.  Subjective: Patient seen in the morning rounds.  Denies any complaints at rest.  Patient tells me that she is not able to get up without getting dizziness or lightheadedness.  She is very worried about unable to mobilize.  She tells me that she walks with a walker at the baseline. Assessment & Plan:   Acute upper GI bleeding secondary to Dieulafoy for lesions of the stomach Acute blood loss anemia with chronic iron deficiency anemia  Patient received 1 unit of PRBC transfusion.  Her hemoglobin has remained stable since then. Underwent upper GI endoscopy-no gross lesions.  3 cm hiatal hernia, evidence of hematin found in the entire stomach.  Likely bleeding lesion cauterized. GI recommended PPI twice daily.  Treated with IV PPI for 48 hours.  Now on oral PPI.  Also on Carafate . Recheck hemoglobin tomorrow morning.  Pneumonia of the left lower lobe due to infectious organism: Had episode of fever.  Chest x-ray on 8/22 with left lower lobe infiltrate.  Initially treated with ceftriaxone  and azithromycin .  Now on Augmentin  and doxycycline .  Continue 7 days of antibiotic treatment.  Orthostatic syncope: No orthostatic drop in blood pressure, however patient has postural dizziness.  All the time fall precautions.  Orthostatic precautions.  Continue to work with PT OT.  Hypothyroidism: On Synthroid .  Continue.  Dementia without behavioral disturbances: On Aricept .  B12 deficiency: Vitamin B12 is  112.  Patient was given IV B12 and then on oral replacement.  Will need recheck in 1 month.     DVT prophylaxis: SCDs Start: 10/09/23 9081   Code Status: DNR with limited intervention Family Communication: None at the bedside Disposition Plan: Status is: Inpatient Remains inpatient appropriate because: Still symptomatic.     Consultants:  None  Procedures:  None  Antimicrobials:  Doxycycline  and Rocephin  8/21--     Objective: Vitals:   10/14/23 2106 10/15/23 0611 10/15/23 0912 10/15/23 0932  BP: (!) 154/65 (!) 146/53 (!) 167/63   Pulse: (!) 56 (!) 52 (!) 53 (!) 59  Resp: 18 14 16    Temp: 98.6 F (37 C) 98.1 F (36.7 C) 98.5 F (36.9 C)   TempSrc: Oral Oral Oral   SpO2: 94% 98% 98% 97%  Weight:      Height:        Intake/Output Summary (Last 24 hours) at 10/15/2023 1320 Last data filed at 10/15/2023 0924 Gross per 24 hour  Intake 1127.33 ml  Output --  Net 1127.33 ml   Filed Weights   10/08/23 2150 10/09/23 1652  Weight: 55.2 kg 55.2 kg    Examination:  General exam: Appears calm and comfortable.  Chronically sick looking.  Debilitated.  She is legally blind.  Only sees waving of her hands. Respiratory system: Clear to auscultation. Respiratory effort normal.  No added sounds. Cardiovascular system: S1 & S2 heard, RRR.  No pedal edema. Gastrointestinal system: Soft.  Nontender.  Bowel sound present. Central nervous system: Alert and oriented. No focal neurological deficits.  Gross generalized weakness.    Data Reviewed: I  have personally reviewed following labs and imaging studies  CBC: Recent Labs  Lab 10/08/23 2203 10/09/23 0341 10/10/23 0103 10/10/23 1002 10/12/23 1440 10/13/23 0557 10/14/23 0524 10/14/23 1758 10/15/23 0556  WBC 7.1  --  8.6  --   --  3.5* 3.5*  --  3.4*  NEUTROABS 5.1  --   --   --   --  2.5 1.8  --   --   HGB 11.1*   < > 7.9*   < > 7.9* 7.4* 7.4* 9.2* 9.2*  HCT 33.9*   < > 23.9*   < > 23.8* 22.3* 22.5* 28.0* 28.1*   MCV 91.4  --  92.3  --   --  92.5 91.8  --  90.1  PLT 200  --  125*  --   --  142* 156  --  159   < > = values in this interval not displayed.   Basic Metabolic Panel: Recent Labs  Lab 10/08/23 2203 10/10/23 0103 10/13/23 0557 10/14/23 0524  NA 131* 139 140 138  K 4.0 3.7 3.5 3.7  CL 99 105 104 102  CO2 24 27 29 26   GLUCOSE 171* 104* 103* 90  BUN 33* 18 12 12   CREATININE 0.84 0.58 0.68 0.70  CALCIUM  7.9* 8.1* 7.8* 7.9*  MG  --   --  1.9 2.1   GFR: Estimated Creatinine Clearance: 41.8 mL/min (by C-G formula based on SCr of 0.7 mg/dL). Liver Function Tests: Recent Labs  Lab 10/08/23 2203 10/13/23 0557 10/14/23 0524  AST 18 10* 12*  ALT 10 6 5   ALKPHOS 55 45 44  BILITOT 0.5 0.4 0.4  PROT 5.5* 4.8* 4.6*  ALBUMIN 3.3* 2.6* 2.5*   Recent Labs  Lab 10/08/23 2203  LIPASE 57*   No results for input(s): AMMONIA in the last 168 hours. Coagulation Profile: Recent Labs  Lab 10/09/23 0341  INR 1.1   Cardiac Enzymes: No results for input(s): CKTOTAL, CKMB, CKMBINDEX, TROPONINI in the last 168 hours. BNP (last 3 results) No results for input(s): PROBNP in the last 8760 hours. HbA1C: No results for input(s): HGBA1C in the last 72 hours. CBG: No results for input(s): GLUCAP in the last 168 hours. Lipid Profile: No results for input(s): CHOL, HDL, LDLCALC, TRIG, CHOLHDL, LDLDIRECT in the last 72 hours. Thyroid  Function Tests: No results for input(s): TSH, T4TOTAL, FREET4, T3FREE, THYROIDAB in the last 72 hours. Anemia Panel: No results for input(s): VITAMINB12, FOLATE, FERRITIN, TIBC, IRON, RETICCTPCT in the last 72 hours. Sepsis Labs: No results for input(s): PROCALCITON, LATICACIDVEN in the last 168 hours.  Recent Results (from the past 240 hours)  MRSA Next Gen by PCR, Nasal     Status: None   Collection Time: 10/09/23  6:48 PM   Specimen: Nasal Mucosa; Nasal Swab  Result Value Ref Range Status   MRSA by PCR  Next Gen NOT DETECTED NOT DETECTED Final    Comment: (NOTE) The GeneXpert MRSA Assay (FDA approved for NASAL specimens only), is one component of a comprehensive MRSA colonization surveillance program. It is not intended to diagnose MRSA infection nor to guide or monitor treatment for MRSA infections. Test performance is not FDA approved in patients less than 58 years old. Performed at [facility], [address] [place], [place] [address]   Resp panel by RT-PCR (RSV, Flu A&B, Covid) Anterior Nasal Swab     Status: None   Collection Time: 10/11/23  8:21 PM   Specimen: Anterior Nasal Swab  Result Value Ref Range Status   SARS Coronavirus 2 by RT PCR NEGATIVE NEGATIVE Final    Comment: (NOTE) SARS-CoV-2 target nucleic acids are NOT DETECTED.  The SARS-CoV-2 RNA is generally detectable in upper respiratory specimens during the acute phase of infection. The lowest concentration of SARS-CoV-2 viral copies this assay can detect is 138 copies/mL. A negative result does not preclude SARS-Cov-2 infection and should not be used as the sole basis for treatment or other patient management decisions. A negative result may occur with  improper specimen collection/handling, submission of specimen other than nasopharyngeal swab, presence of viral mutation(s) within the areas targeted by this assay, and inadequate number of viral copies(<138 copies/mL). A negative result must be combined with clinical observations, patient history, and epidemiological information. The expected result is Negative.  Fact Sheet for Patients:  BloggerCourse.com  Fact Sheet for Healthcare Providers:  SeriousBroker.it  This test is no t yet approved or cleared by the [place]  FDA and  has been authorized for detection and/or diagnosis of SARS-CoV-2 by FDA under an Emergency Use Authorization (EUA). This EUA will remain  in effect (meaning  this test can be used) for the duration of the COVID-19 declaration under Section 564(b)(1) of the Act, 21 U.S.C.section 360bbb-3(b)(1), unless the authorization is terminated  or revoked sooner.       Influenza A by PCR NEGATIVE NEGATIVE Final   Influenza B by PCR NEGATIVE NEGATIVE Final    Comment: (NOTE) The Xpert Xpress SARS-CoV-2/FLU/RSV plus assay is intended as an aid in the diagnosis of influenza from Nasopharyngeal swab specimens and should not be used as a sole basis for treatment. Nasal washings and aspirates are unacceptable for Xpert Xpress SARS-CoV-2/FLU/RSV testing.  Fact Sheet for Patients: BloggerCourse.com  Fact Sheet for Healthcare Providers: SeriousBroker.it  This test is not yet approved or cleared by the [place]  FDA and has been authorized for detection and/or diagnosis of SARS-CoV-2 by FDA under an Emergency Use Authorization (EUA). This EUA will remain in effect (meaning this test can be used) for the duration of the COVID-19 declaration under Section 564(b)(1) of the Act, 21 U.S.C. section 360bbb-3(b)(1), unless the authorization is terminated or revoked.     Resp Syncytial Virus by PCR NEGATIVE NEGATIVE Final    Comment: (NOTE) Fact Sheet for Patients: BloggerCourse.com  Fact Sheet for Healthcare Providers: SeriousBroker.it  This test is not yet approved or cleared by the [place]  FDA and has been authorized for detection and/or diagnosis of SARS-CoV-2 by FDA under an Emergency Use Authorization (EUA). This EUA will remain in effect (meaning this test can be used) for the duration of the COVID-19 declaration under Section 564(b)(1) of the Act, 21 U.S.C. section 360bbb-3(b)(1), unless the authorization is terminated or revoked.  Performed at [facility], [address] [place], [place] [address]   Culture, blood  (Routine X 2) w Reflex to ID Panel     Status: None (Preliminary result)   Collection Time: 10/11/23  8:59 PM   Specimen: BLOOD  Result Value Ref Range Status   Specimen Description   Final    BLOOD BLOOD RIGHT HAND Performed at [facility], [address], [place], [place] [address]    Special Requests   Final    Blood Culture results may not be optimal due to an inadequate volume of blood received in culture bottles BOTTLES DRAWN AEROBIC ONLY Performed at [facility], [address] [place], [place] [address]  Culture   Final    NO GROWTH 3 DAYS Performed at [facility], [address] [place], [place] [address]    Report Status PENDING  Incomplete  Culture, blood (Routine X 2) w Reflex to ID Panel     Status: None (Preliminary result)   Collection Time: 10/11/23  9:28 PM   Specimen: BLOOD  Result Value Ref Range Status   Specimen Description   Final    BLOOD BLOOD LEFT ARM Performed at [facility], [address] [place], [place] [address]    Special Requests   Final    Blood Culture results may not be optimal due to an inadequate volume of blood received in culture bottles BOTTLES DRAWN AEROBIC ONLY Performed at [facility], [address] [place], [place] [address]    Culture   Final    NO GROWTH 3 DAYS Performed at [facility], [address], [place], [place] [address]    Report Status PENDING  Incomplete         Radiology Studies: DG Abd 1 View Result Date: 10/14/2023 CLINICAL DATA:  Abdominal pain. EXAM: ABDOMEN - 1 VIEW COMPARISON:  None Available. FINDINGS: Scattered gas and stool throughout the colon. No small or large bowel distention. No radiopaque stones. Surgical clips in the upper abdomen and right lower quadrant. Degenerative changes in the spine and shoulders. Linear atelectasis or fibrosis in the lung bases. IMPRESSION: Normal nonobstructive bowel gas  pattern. Electronically Signed   By: Elsie Gravely M.D.   On: 10/14/2023 19:31        Scheduled Meds:  amoxicillin -clavulanate  1 tablet Oral Q12H   vitamin B-12  1,000 mcg Oral Daily   donepezil   10 mg Oral QHS   doxycycline   100 mg Oral Q12H   feeding supplement  1 Container Oral TID BM   ferrous sulfate   325 mg Oral QAC breakfast   fluticasone   1 spray Each Nare Daily   levothyroxine   100 mcg Oral Q0600   melatonin  5 mg Oral QHS   pantoprazole   40 mg Oral BID   polyethylene glycol  17 g Oral Daily   sertraline   200 mg Oral QHS   sucralfate   1 g Oral BID   traZODone   150 mg Oral QHS   Continuous Infusions:  lactated ringers        LOS: 6 days    Time spent: 45 minutes    Renato Applebaum, MD [facility]

## 2023-10-15 NOTE — TOC Transition Note (Signed)
 Transition of Care [facility]) - Discharge Note   Patient Details  Name: Erika Conley MRN: [id number] Date of Birth: [birth year]  Transition of [facility] CM/SW Contact:  Bascom Service, RN Phone Number: 10/15/2023, 10:40 AM   Clinical Narrative:For return back to [facility] @ d/c. DNR. PTAR.       Final next level of care: Long Term Nursing Home Barriers to Discharge: No Barriers Identified   Patient Goals and CMS Choice Patient states their goals for this hospitalization and ongoing recovery are:: Return back to [facility] [place] [email or web address].gov Compare Post Acute Care list provided to:: Patient Represenative (must comment) (Angie(friend)) Choice offered to / list presented to : Adult Children [place]nard ownership interest in [facility].provided to:: Patient    Discharge Placement                       Discharge Plan and Services Additional resources added to the After Visit Summary for     Discharge Planning Services: CM Consult Post Acute Care Choice: Nursing Home                               Social Drivers of Health (SDOH) Interventions SDOH Screenings   Food Insecurity: No Food Insecurity (10/09/2023)  Housing: Low Risk  (10/09/2023)  Transportation Needs: No Transportation Needs (10/09/2023)  Utilities: Not At Risk (10/09/2023)  Social Connections: Socially Isolated (10/09/2023)  Tobacco Use: Low Risk  (10/09/2023)     Readmission Risk Interventions    02/01/2023   12:43 PM  Readmission Risk Prevention Plan  Transportation Screening Complete  PCP or Specialist Appt within 5-7 Days Complete  Home Care Screening Complete  Medication Review (RN CM) Complete

## 2023-10-15 NOTE — Progress Notes (Signed)
 Mobility Specialist - Progress Note   10/15/23 1348  Mobility  Activity Pivoted/transferred from chair to bed  Level of Assistance Minimal assist, patient does 75% or more  Assistive Device Other (Comment) (HHA)  Range of Motion/Exercises Active Assistive  Activity Response Tolerated well  Mobility Referral Yes  Mobility visit 1 Mobility  Mobility Specialist Start Time (ACUTE ONLY) 1338  Mobility Specialist Stop Time (ACUTE ONLY) 1348  Mobility Specialist Time Calculation (min) (ACUTE ONLY) 10 min   Pt requested to transfer back to bed. Assisted with transfer and at EOS was left in bed with all needs met. Call bell in reach.  Erminio Leos,  Mobility Specialist Can be reached via Secure Chat

## 2023-10-15 NOTE — Progress Notes (Signed)
 Occupational Therapy Treatment Patient Details Name: Erika Conley MRN: [id number] DOB: 1934/10/29 Today's Date: 10/15/2023   History of present illness 89 yo female presents to therapy following hospitalization on 10/08/2023 due to sudden onset of nausea and dizziness resulting in fall with emesis. Pt underwent EGD, injection of submucosal control of hemorrhage by clipping on 8/19 due to hematemesis and anemia. Pt dx with upper GI bleed. Pt PMH includes but is not limited to: anxiety, arthritis, depression, falls, fibromyalgia, GERD, HTN, vertigo, thyroid  dz, back surgery, and R ankle ORIF (2024).   OT comments  Pt reports she has been tolerating more upright positioning, and sat in the recliner for several hours today. Pt participates in ADLs while in chair position in bed. Requires setup for grooming, applying deodorant, and doffing/donning gown. Pt continues to endorse c/o dizziness in bed, educate on importance of sitting upright in chair position and continuing to work on mobility throughout the day. Discharge recommendation remains appropriate, OT will continue to follow.       If plan is discharge home, recommend the following:  A lot of help with walking and/or transfers;A lot of help with bathing/dressing/bathroom;Assistance with cooking/housework;Assistance with feeding;Direct supervision/assist for medications management;Direct supervision/assist for financial management;Assist for transportation;Help with stairs or ramp for entrance;Supervision due to cognitive status   Equipment Recommendations  Other (comment)       Precautions / Restrictions Precautions Precautions: Fall Recall of Precautions/Restrictions: Intact Precaution/Restrictions Comments: legally blind, hx vertigo Restrictions Weight Bearing Restrictions Per Provider Order: No Other Position/Activity Restrictions: legally blind       Mobility Bed Mobility Overal bed mobility: Needs Assistance                   Transfers Overall transfer level: Needs assistance                 General transfer comment: NT, pt placed in chair position in bed     Balance Overall balance assessment: Needs assistance                                         ADL either performed or assessed with clinical judgement   ADL Overall ADL's : Needs assistance/impaired     Grooming: Wash/dry face;Wash/dry hands;Sitting;Applying deodorant Grooming Details (indicate cue type and reason): chair position in bed Upper Body Bathing: Sitting;Set up Upper Body Bathing Details (indicate cue type and reason): chair position in bed     Upper Body Dressing : Set up;Sitting                     General ADL Comments: Pt declines OOB mobility as she just recently returned to bed from sitting in chair. Pt participates in grooming tasks and UB bathing/dressing     Communication Communication Communication: Impaired Factors Affecting Communication: Hearing impaired   Cognition Arousal: Alert Behavior During Therapy: Flat affect Cognition: Difficult to assess                               Following commands: Intact        Cueing   Cueing Techniques: Verbal cues, Gestural cues, Tactile cues             Pertinent Vitals/ Pain       Pain Assessment Pain Assessment: No/denies pain   Frequency  Min  2X/week        Progress Toward Goals  OT Goals(current goals can now be found in the care plan section)  Progress towards OT goals: Progressing toward goals  Acute Rehab OT Goals OT Goal Formulation: With patient Time For Goal Achievement: 10/25/23 Potential to Achieve Goals: Fair ADL Goals Pt Will Perform Grooming: sitting;standing;with contact guard assist Pt Will Perform Upper Body Dressing: with supervision;sitting Pt Will Perform Lower Body Dressing: with supervision;sit to/from stand;sitting/lateral leans Pt Will Transfer to Toilet: with contact guard  assist;bedside commode Pt Will Perform Toileting - Clothing Manipulation and hygiene: with contact guard assist;sitting/lateral leans;sit to/from stand  Plan      AM-PAC OT 6 Clicks Daily Activity     Outcome Measure   Help from another person eating meals?: A Little Help from another person taking care of personal grooming?: A Little Help from another person toileting, which includes using toliet, bedpan, or urinal?: A Lot Help from another person bathing (including washing, rinsing, drying)?: A Lot Help from another person to put on and taking off regular upper body clothing?: A Lot Help from another person to put on and taking off regular lower body clothing?: A Lot 6 Click Score: 14    End of Session    OT Visit Diagnosis: Unsteadiness on feet (R26.81);Other abnormalities of gait and mobility (R26.89);Repeated falls (R29.6);Muscle weakness (generalized) (M62.81);Dizziness and giddiness (R42);History of falling (Z91.81)   Activity Tolerance Patient tolerated treatment well   Patient Left in bed;with bed alarm set;with call bell/phone within reach   Nurse Communication Mobility status        Time: 8592-8579 OT Time Calculation (min): 13 min  Charges: OT General Charges $OT Visit: 1 Visit OT Treatments $Self Care/Home Management : 8-22 mins  Davisha Linthicum L. Satya Bohall, OTR/L  10/15/23, 4:24 PM

## 2023-10-15 NOTE — Progress Notes (Signed)
 Mobility Specialist - Progress Note   10/15/23 1236  Mobility  Activity Pivoted/transferred from bed to chair  Level of Assistance Minimal assist, patient does 75% or more  Assistive Device Other (Comment) (HHA)  Range of Motion/Exercises Active Assistive  Activity Response Tolerated well  Mobility Referral Yes  Mobility visit 1 Mobility  Mobility Specialist Start Time (ACUTE ONLY) 1225  Mobility Specialist Stop Time (ACUTE ONLY) 1236  Mobility Specialist Time Calculation (min) (ACUTE ONLY) 11 min   Pt was found in bed and agreeable to mobilize. No complaints with transfer. Was left on recliner chair with all needs met. Call bell in reach and chair alarm on. RN and NT notified.   Erminio Leos,  Mobility Specialist Can be reached via Secure Chat

## 2023-10-16 DIAGNOSIS — K922 Gastrointestinal hemorrhage, unspecified: Secondary | ICD-10-CM | POA: Diagnosis not present

## 2023-10-16 MED ORDER — LORAZEPAM 0.5 MG PO TABS: 0.2500 mg | ORAL_TABLET | Freq: Two times a day (BID) | ORAL | 0 refills | Status: AC

## 2023-10-16 MED ORDER — OMEPRAZOLE 40 MG PO CPDR: 40.0000 mg | DELAYED_RELEASE_CAPSULE | Freq: Two times a day (BID) | ORAL | Status: AC

## 2023-10-16 MED ORDER — POLYETHYLENE GLYCOL 3350 17 G PO PACK: 17.0000 g | PACK | Freq: Every day | ORAL | 0 refills | Status: AC

## 2023-10-16 MED ORDER — AMOXICILLIN-POT CLAVULANATE 875-125 MG PO TABS: 1.0000 | ORAL_TABLET | Freq: Two times a day (BID) | ORAL | 0 refills | Status: AC

## 2023-10-16 MED ORDER — SUCRALFATE 1 GM/10ML PO SUSP: 1.0000 g | Freq: Two times a day (BID) | ORAL | 0 refills | Status: AC

## 2023-10-16 MED ORDER — FERROUS SULFATE 325 (65 FE) MG PO TABS: 325.0000 mg | ORAL_TABLET | Freq: Every day | ORAL | Status: AC

## 2023-10-16 MED ORDER — CYANOCOBALAMIN 1000 MCG PO TABS: 1000.0000 ug | ORAL_TABLET | Freq: Every day | ORAL | Status: AC

## 2023-10-16 MED ORDER — HYDROCODONE-ACETAMINOPHEN 5-325 MG PO TABS
1.0000 | ORAL_TABLET | Freq: Four times a day (QID) | ORAL | 0 refills | Status: AC | PRN
Start: 2023-10-16 — End: 2024-10-15

## 2023-10-16 NOTE — Discharge Summary (Signed)
 Physician Discharge Summary  ENZA SHONE FMW:[id number] DOB: Aug 18, 1934 DOA: 10/08/2023  PCP: Pcp, No  Admit date: 10/08/2023 Discharge date: 10/16/2023  Admitted From: Long-term care Disposition: Long-term care  Recommendations for Outpatient Follow-up:  Follow up with PCP in 1-2 weeks Please obtain BMP/CBC/magnesium  in one week   Discharge Condition: Stable CODE STATUS: DNR with limited intervention Diet recommendation: Regular diet, nutritional supplements  Discharge summary: 89 year old with history of GERD, hypothyroidism, chronic orthostatic hypotension from assisted living facility presented to the hospital with hematemesis.  Also evidence of orthostatic syncope.  In the emergency room hemodynamically stable.  Also there was concern for aspiration.  Patient was started on IV Protonix , IV antibiotics, fluid and admitted to hospital with GI consultation.  Patient was treated for following conditions.  Assessment & plan of care:   Acute upper GI bleeding secondary to Dieulafoy for lesions of the stomach Acute blood loss anemia with chronic iron deficiency anemia   Patient received 1 unit of PRBC transfusion.  Her hemoglobin has remained stable since then. Underwent upper GI endoscopy-no gross lesions.  3 cm hiatal hernia, evidence of hematin found in the entire stomach.  Likely bleeding lesion cauterized. GI recommended PPI twice daily.  Treated with IV PPI for 48 hours.  Now on oral PPI.  Also on Carafate . She will continue omeprazole  40 mg twice daily as well as Carafate .  GI to schedule outpatient follow-up.   Pneumonia of the left lower lobe due to infectious organism: Had episode of fever after hospitalization.  Chest x-ray on 8/22 with left lower lobe infiltrate.  Initially treated with ceftriaxone  and azithromycin .  Now on Augmentin  and doxycycline .  Clinically improved.  No evidence of obvious aspiration on swallow evaluation.  Complete Augmentin  for total 7 days.  For  additional days.  Orthostatic syncope: No orthostatic drop in blood pressure, however patient has postural dizziness.  Pressures are fairly normal.  Discontinue amlodipine .  All the time fall precautions.  Orthostatic precautions.  Continue to work with PT OT.   Hypothyroidism: On Synthroid .  Continue.   Dementia without behavioral disturbances: On Aricept .  Fairly stable.   B12 deficiency: Vitamin B12 is 112.  Patient was given IV B12 and then on oral replacement.  Will need recheck in 1 month.  Medically stable to transition to long-term care today.  Discharge Diagnoses:  Principal Problem:   Upper GI bleed Active Problems:   Hypothyroidism   Dementia without behavioral disturbance (HCC)   Acute blood loss anemia   Upper GI bleeding   Hematemesis   Dieulafoy lesion of stomach   Pneumonia of left lower lobe due to infectious organism   Iron deficiency anemia due to chronic blood loss   Vitamin B12 deficiency anemia   Orthostatic syncope    Discharge Instructions  Discharge Instructions     Diet - low sodium heart healthy   Complete by: As directed    Increase activity slowly   Complete by: As directed       Allergies as of 10/16/2023       Reactions   Beta Sitosterol [sitosterols] Other (See Comments)   Allergic, per [facility]   Blueberry [vaccinium Angustifolium] Other (See Comments)   Allergic, per [facility]   Chocolate Itching, Other (See Comments)   Allergic, per [facility]   Codeine Other (See Comments)   Passes out and falls   Cortisone Other (See Comments)   Passes out and falls- Allergic, per [facility]   Guggulipid-black Pepper Itching   Pepper  Morphine  Other (See Comments)   Allergic, per [facility]   Nsaids Other (See Comments)   Allergic, per [facility]   Other Other (See Comments)   All narcotics cause patient to pass out and fall        Medication List     STOP taking these medications    amLODipine  10 MG tablet Commonly known as: NORVASC         TAKE these medications    acetaminophen  325 MG tablet Commonly known as: TYLENOL  Take 1 tablet (325 mg total) by mouth every 4 (four) hours as needed for headache. What changed:  how much to take when to take this additional instructions   amoxicillin -clavulanate 875-125 MG tablet Commonly known as: AUGMENTIN  Take 1 tablet by mouth every 12 (twelve) hours for 4 days.   Biofreeze Cool The Pain 4 % Gel Generic drug: Menthol  (Topical Analgesic) Apply 1 application  topically daily.   busPIRone  10 MG tablet Commonly known as: BUSPAR  Take 20 mg by mouth 2 (two) times daily.   calcium  carbonate 500 MG chewable tablet Commonly known as: TUMS - dosed in mg elemental calcium  Chew 2-3 tablets by mouth as directed. Give 1 tablet by mouth before meals & Give 2 tablets by mouth PRN for TID with meals   cetirizine 10 MG tablet Commonly known as: ZYRTEC Take 10 mg by mouth daily as needed for allergies.   cyanocobalamin  1000 MCG tablet Take 1 tablet (1,000 mcg total) by mouth daily.   dicyclomine  20 MG tablet Commonly known as: BENTYL  Take 1 tablet (20 mg total) by mouth 3 (three) times daily before meals.   donepezil  10 MG tablet Commonly known as: ARICEPT  Take 10 mg by mouth at bedtime.   famotidine  20 MG tablet Commonly known as: PEPCID  Take 20 mg by mouth 2 (two) times daily.   ferrous sulfate  325 (65 FE) MG tablet Take 1 tablet (325 mg total) by mouth daily before breakfast.   fluticasone  50 MCG/ACT nasal spray Commonly known as: FLONASE  Place 1 spray into both nostrils in the morning and at bedtime.   HEALTHY EYES SUPERVISION 2 PO Take 1 capsule by mouth 2 (two) times daily.   HYDROcodone -acetaminophen  5-325 MG tablet Commonly known as: NORCO/VICODIN Take 1 tablet by mouth every 6 (six) hours as needed for moderate pain (pain score 4-6) or severe pain (pain score 7-10).   hydrOXYzine  10 MG tablet Commonly known as: ATARAX  Take 1 tablet (10 mg total) by mouth  every 6 (six) hours as needed for itching.   levothyroxine  100 MCG tablet Commonly known as: SYNTHROID  Take 100 mcg by mouth daily before breakfast.   loperamide  2 MG tablet Commonly known as: IMODIUM  A-D Take 2-4 mg by mouth See admin instructions. Take 4 mg by mouth after the 1st loose stool movement and an additional 2 mg after each loose stool (CANNOT EXCEED 16 MG/24 HOURS)   LORazepam  0.5 MG tablet Commonly known as: ATIVAN  Take 0.5-1 tablets (0.25-0.5 mg total) by mouth 2 (two) times daily. Take 0.25 mg by mouth in the morning & Take 0.5 mg by mouth in the evening   omeprazole  40 MG capsule Commonly known as: PRILOSEC Take 1 capsule (40 mg total) by mouth 2 (two) times daily. Take shortly before breakfast and dinner meal What changed:  when to take this additional instructions   polyethylene glycol 17 g packet Commonly known as: MIRALAX  / GLYCOLAX  Take 17 g by mouth daily.   promethazine  25 MG tablet Commonly known as:  PHENERGAN  Take 25 mg by mouth every 4 (four) hours as needed for nausea or vomiting.   REFRESH OPTIVE OP Place 1 drop into both eyes See admin instructions. Instill 1 drop into one or both eyes up to 6 times a day as needed for dryness   senna-docusate 8.6-50 MG tablet Commonly known as: Senokot-S Take 1 tablet by mouth at bedtime as needed for mild constipation.   sertraline  100 MG tablet Commonly known as: ZOLOFT  Take 200 mg by mouth at bedtime.   sucralfate  1 GM/10ML suspension Commonly known as: CARAFATE  Take 10 mLs (1 g total) by mouth 2 (two) times daily.   traZODone  150 MG tablet Commonly known as: DESYREL  Take 150 mg by mouth at bedtime.   Voltaren  1 % Gel Generic drug: diclofenac  Sodium Apply 2 g topically 2 (two) times daily as needed (for back pain).        Contact information for after-discharge care     Destination     [facility] .   Service: Skilled Nursing Contact information: [address] [place]  [place] [address] [phone]                    Allergies  Allergen Reactions   Beta Sitosterol [Sitosterols] Other (See Comments)    Allergic, per [facility]   Blueberry [Vaccinium Angustifolium] Other (See Comments)    Allergic, per [facility]   Chocolate Itching and Other (See Comments)    Allergic, per [facility]   Codeine Other (See Comments)    Passes out and falls   Cortisone Other (See Comments)    Passes out and falls- Allergic, per [facility]   Guggulipid-Black Pepper Itching    Pepper   Morphine  Other (See Comments)    Allergic, per [facility]   Nsaids Other (See Comments)    Allergic, per [facility]   Other Other (See Comments)    All narcotics cause patient to pass out and fall     Consultations: None   Procedures/Studies: DG Abd 1 View Result Date: 10/14/2023 CLINICAL DATA:  Abdominal pain. EXAM: ABDOMEN - 1 VIEW COMPARISON:  None Available. FINDINGS: Scattered gas and stool throughout the colon. No small or large bowel distention. No radiopaque stones. Surgical clips in the upper abdomen and right lower quadrant. Degenerative changes in the spine and shoulders. Linear atelectasis or fibrosis in the lung bases. IMPRESSION: Normal nonobstructive bowel gas pattern. Electronically Signed   By: Elsie Gravely M.D.   On: 10/14/2023 19:31   DG Chest 1 View Result Date: 10/11/2023 CLINICAL DATA:  Fever.  Hematemesis. EXAM: CHEST  1 VIEW COMPARISON:  Radiograph 10/09/2023 FINDINGS: Stable heart size and mediastinal contours. Patchy opacity at the left lung base, unchanged allowing for differences in positioning. Suspected small pleural effusions. No pneumothorax. No pulmonary edema. Chronic shoulder arthropathy. Remote left proximal humerus fracture. IMPRESSION: 1. Patchy opacity at the left lung base, unchanged allowing for differences in positioning. 2. Suspected small pleural effusions. Electronically Signed   By: Andrea Gasman M.D.   On: 10/11/2023 22:31   DG Chest Portable 1  View Result Date: 10/09/2023 CLINICAL DATA:  Hypoxia EXAM: PORTABLE CHEST 1 VIEW COMPARISON:  01/30/2023 FINDINGS: Low lung volumes. Heterogeneous airspace disease at left base suspicious for pneumonia. No pleural effusion. Normal cardiac size with aortic atherosclerosis. Old fracture deformity of the proximal left humerus IMPRESSION: Heterogeneous airspace disease at the left base suspicious for pneumonia. Electronically Signed   By: Luke Bun M.D.   On: 10/09/2023 02:11   (  Echo, Carotid, EGD, Colonoscopy, ERCP)    Subjective: Patient seen and examined.  No overnight events.  Denies any complaints.  Denies any nausea vomiting.  She was able to stand up with a walker yesterday.  Still experience some dizziness without drop in blood pressure.   Discharge Exam: Vitals:   10/15/23 2150 10/16/23 0616  BP: (!) 154/48 (!) 154/55  Pulse: (!) 55 (!) 59  Resp: 16 18  Temp: 99 F (37.2 C) 99.1 F (37.3 C)  SpO2: 90% 91%   Vitals:   10/15/23 0932 10/15/23 1358 10/15/23 2150 10/16/23 0616  BP:  (!) 137/57 (!) 154/48 (!) 154/55  Pulse: (!) 59 (!) 59 (!) 55 (!) 59  Resp:  16 16 18   Temp:  98.6 F (37 C) 99 F (37.2 C) 99.1 F (37.3 C)  TempSrc:  Oral Oral Oral  SpO2: 97% 99% 90% 91%  Weight:      Height:        General: Pt is alert, awake, not in acute distress Pleasant interactive.  Chronically sick looking.  Debilitated.  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 Cardiovascular: RRR, S1/S2 +, no rubs, no gallops Respiratory: CTA bilaterally, no wheezing, no rhonchi Abdominal: Soft, NT, ND, bowel sounds + Extremities: no edema, no cyanosis    The results of significant diagnostics from this hospitalization (including imaging, microbiology, ancillary and laboratory) are listed below for reference.     Microbiology: Recent Results (from the past 240 hours)  MRSA Next Gen by PCR, Nasal     Status: None   Collection Time: 10/09/23  6:48 PM   Specimen: Nasal Mucosa;  Nasal Swab  Result Value Ref Range Status   MRSA by PCR Next Gen NOT DETECTED NOT DETECTED Final    Comment: (NOTE) The GeneXpert MRSA Assay (FDA approved for NASAL specimens only), is one component of a comprehensive MRSA colonization surveillance program. It is not intended to diagnose MRSA infection nor to guide or monitor treatment for MRSA infections. Test performance is not FDA approved in patients less than 17 years old. Performed at [facility], [address] [place], [place] [address]   Resp panel by RT-PCR (RSV, Flu A&B, Covid) Anterior Nasal Swab     Status: None   Collection Time: 10/11/23  8:21 PM   Specimen: Anterior Nasal Swab  Result Value Ref Range Status   SARS Coronavirus 2 by RT PCR NEGATIVE NEGATIVE Final    Comment: (NOTE) SARS-CoV-2 target nucleic acids are NOT DETECTED.  The SARS-CoV-2 RNA is generally detectable in upper respiratory specimens during the acute phase of infection. The lowest concentration of SARS-CoV-2 viral copies this assay can detect is 138 copies/mL. A negative result does not preclude SARS-Cov-2 infection and should not be used as the sole basis for treatment or other patient management decisions. A negative result may occur with  improper specimen collection/handling, submission of specimen other than nasopharyngeal swab, presence of viral mutation(s) within the areas targeted by this assay, and inadequate number of viral copies(<138 copies/mL). A negative result must be combined with clinical observations, patient history, and epidemiological information. The expected result is Negative.  Fact Sheet for Patients:  BloggerCourse.com  Fact Sheet for Healthcare Providers:  SeriousBroker.it  This test is no t yet approved or cleared by the [place]  FDA and  has been authorized for detection and/or diagnosis of SARS-CoV-2 by FDA under an Emergency Use  Authorization (EUA). This EUA will remain  in effect (meaning  this test can be used) for the duration of the COVID-19 declaration under Section 564(b)(1) of the Act, 21 U.S.C.section 360bbb-3(b)(1), unless the authorization is terminated  or revoked sooner.       Influenza A by PCR NEGATIVE NEGATIVE Final   Influenza B by PCR NEGATIVE NEGATIVE Final    Comment: (NOTE) The Xpert Xpress SARS-CoV-2/FLU/RSV plus assay is intended as an aid in the diagnosis of influenza from Nasopharyngeal swab specimens and should not be used as a sole basis for treatment. Nasal washings and aspirates are unacceptable for Xpert Xpress SARS-CoV-2/FLU/RSV testing.  Fact Sheet for Patients: BloggerCourse.com  Fact Sheet for Healthcare Providers: SeriousBroker.it  This test is not yet approved or cleared by the [place]  FDA and has been authorized for detection and/or diagnosis of SARS-CoV-2 by FDA under an Emergency Use Authorization (EUA). This EUA will remain in effect (meaning this test can be used) for the duration of the COVID-19 declaration under Section 564(b)(1) of the Act, 21 U.S.C. section 360bbb-3(b)(1), unless the authorization is terminated or revoked.     Resp Syncytial Virus by PCR NEGATIVE NEGATIVE Final    Comment: (NOTE) Fact Sheet for Patients: BloggerCourse.com  Fact Sheet for Healthcare Providers: SeriousBroker.it  This test is not yet approved or cleared by the [place]  FDA and has been authorized for detection and/or diagnosis of SARS-CoV-2 by FDA under an Emergency Use Authorization (EUA). This EUA will remain in effect (meaning this test can be used) for the duration of the COVID-19 declaration under Section 564(b)(1) of the Act, 21 U.S.C. section 360bbb-3(b)(1), unless the authorization is terminated or revoked.  Performed at [facility],  [address] [place], [place] [address]   Culture, blood (Routine X 2) w Reflex to ID Panel     Status: None (Preliminary result)   Collection Time: 10/11/23  8:59 PM   Specimen: BLOOD  Result Value Ref Range Status   Specimen Description   Final    BLOOD BLOOD RIGHT HAND Performed at [facility], [address], [place], [place] [address]    Special Requests   Final    Blood Culture results may not be optimal due to an inadequate volume of blood received in culture bottles BOTTLES DRAWN AEROBIC ONLY Performed at [facility], [address], [place], [place] [address]    Culture   Final    NO GROWTH 4 DAYS Performed at [facility], [address], [place], [place] [address]    Report Status PENDING  Incomplete  Culture, blood (Routine X 2) w Reflex to ID Panel     Status: None (Preliminary result)   Collection Time: 10/11/23  9:28 PM   Specimen: BLOOD  Result Value Ref Range Status   Specimen Description   Final    BLOOD BLOOD LEFT ARM Performed at [facility], [address], [place], [place] [address]    Special Requests   Final    Blood Culture results may not be optimal due to an inadequate volume of blood received in culture bottles BOTTLES DRAWN AEROBIC ONLY Performed at [facility], [address], [place], [place] [address]    Culture   Final    NO GROWTH 4 DAYS Performed at [facility], [address], [place], [place] [address]    Report Status PENDING  Incomplete     Labs: BNP (last 3 results) No results for input(s): BNP in the last  8760 hours. Basic Metabolic Panel: Recent Labs  Lab 10/10/23 0103 10/13/23 0557 10/14/23 0524  NA 139 140 138  K 3.7 3.5 3.7  CL 105 104 102  CO2 27 29 26   GLUCOSE 104* 103* 90  BUN 18 12 12   CREATININE 0.58 0.68 0.70  CALCIUM  8.1* 7.8* 7.9*  MG  --  1.9 2.1   Liver Function Tests: Recent Labs  Lab 10/13/23 0557  10/14/23 0524  AST 10* 12*  ALT 6 5  ALKPHOS 45 44  BILITOT 0.4 0.4  PROT 4.8* 4.6*  ALBUMIN 2.6* 2.5*   No results for input(s): LIPASE, AMYLASE in the last 168 hours. No results for input(s): AMMONIA in the last 168 hours. CBC: Recent Labs  Lab 10/10/23 0103 10/10/23 1002 10/12/23 1440 10/13/23 0557 10/14/23 0524 10/14/23 1758 10/15/23 0556  WBC 8.6  --   --  3.5* 3.5*  --  3.4*  NEUTROABS  --   --   --  2.5 1.8  --   --   HGB 7.9*   < > 7.9* 7.4* 7.4* 9.2* 9.2*  HCT 23.9*   < > 23.8* 22.3* 22.5* 28.0* 28.1*  MCV 92.3  --   --  92.5 91.8  --  90.1  PLT 125*  --   --  142* 156  --  159   < > = values in this interval not displayed.   Cardiac Enzymes: No results for input(s): CKTOTAL, CKMB, CKMBINDEX, TROPONINI in the last 168 hours. BNP: Invalid input(s): POCBNP CBG: No results for input(s): GLUCAP in the last 168 hours. D-Dimer No results for input(s): DDIMER in the last 72 hours. Hgb A1c No results for input(s): HGBA1C in the last 72 hours. Lipid Profile No results for input(s): CHOL, HDL, LDLCALC, TRIG, CHOLHDL, LDLDIRECT in the last 72 hours. Thyroid  function studies No results for input(s): TSH, T4TOTAL, T3FREE, THYROIDAB in the last 72 hours.  Invalid input(s): FREET3 Anemia work up No results for input(s): VITAMINB12, FOLATE, FERRITIN, TIBC, IRON, RETICCTPCT in the last 72 hours. Urinalysis    Component Value Date/Time   COLORURINE STRAW (A) 10/11/2023 2354   APPEARANCEUR CLEAR 10/11/2023 2354   LABSPEC 1.006 10/11/2023 2354   PHURINE 6.0 10/11/2023 2354   GLUCOSEU NEGATIVE 10/11/2023 2354   HGBUR NEGATIVE 10/11/2023 2354   BILIRUBINUR NEGATIVE 10/11/2023 2354   KETONESUR NEGATIVE 10/11/2023 2354   PROTEINUR NEGATIVE 10/11/2023 2354   UROBILINOGEN 0.2 11/21/2014 0501   NITRITE NEGATIVE 10/11/2023 2354   LEUKOCYTESUR SMALL (A) 10/11/2023 2354   Sepsis Labs Recent Labs  Lab 10/10/23 0103  10/13/23 0557 10/14/23 0524 10/15/23 0556  WBC 8.6 3.5* 3.5* 3.4*   Microbiology Recent Results (from the past 240 hours)  MRSA Next Gen by PCR, Nasal     Status: None   Collection Time: 10/09/23  6:48 PM   Specimen: Nasal Mucosa; Nasal Swab  Result Value Ref Range Status   MRSA by PCR Next Gen NOT DETECTED NOT DETECTED Final    Comment: (NOTE) The GeneXpert MRSA Assay (FDA approved for NASAL specimens only), is one component of a comprehensive MRSA colonization surveillance program. It is not intended to diagnose MRSA infection nor to guide or monitor treatment for MRSA infections. Test performance is not FDA approved in patients less than 67 years old. Performed at [facility], [address], [place], [place] [address]   Resp panel by RT-PCR (RSV, Flu A&B, Covid) Anterior Nasal Swab     Status: None  Collection Time: 10/11/23  8:21 PM   Specimen: Anterior Nasal Swab  Result Value Ref Range Status   SARS Coronavirus 2 by RT PCR NEGATIVE NEGATIVE Final    Comment: (NOTE) SARS-CoV-2 target nucleic acids are NOT DETECTED.  The SARS-CoV-2 RNA is generally detectable in upper respiratory specimens during the acute phase of infection. The lowest concentration of SARS-CoV-2 viral copies this assay can detect is 138 copies/mL. A negative result does not preclude SARS-Cov-2 infection and should not be used as the sole basis for treatment or other patient management decisions. A negative result may occur with  improper specimen collection/handling, submission of specimen other than nasopharyngeal swab, presence of viral mutation(s) within the areas targeted by this assay, and inadequate number of viral copies(<138 copies/mL). A negative result must be combined with clinical observations, patient history, and epidemiological information. The expected result is Negative.  Fact Sheet for Patients:  BloggerCourse.com  Fact Sheet for  Healthcare Providers:  SeriousBroker.it  This test is no t yet approved or cleared by the [place]  FDA and  has been authorized for detection and/or diagnosis of SARS-CoV-2 by FDA under an Emergency Use Authorization (EUA). This EUA will remain  in effect (meaning this test can be used) for the duration of the COVID-19 declaration under Section 564(b)(1) of the Act, 21 U.S.C.section 360bbb-3(b)(1), unless the authorization is terminated  or revoked sooner.       Influenza A by PCR NEGATIVE NEGATIVE Final   Influenza B by PCR NEGATIVE NEGATIVE Final    Comment: (NOTE) The Xpert Xpress SARS-CoV-2/FLU/RSV plus assay is intended as an aid in the diagnosis of influenza from Nasopharyngeal swab specimens and should not be used as a sole basis for treatment. Nasal washings and aspirates are unacceptable for Xpert Xpress SARS-CoV-2/FLU/RSV testing.  Fact Sheet for Patients: BloggerCourse.com  Fact Sheet for Healthcare Providers: SeriousBroker.it  This test is not yet approved or cleared by the [place]  FDA and has been authorized for detection and/or diagnosis of SARS-CoV-2 by FDA under an Emergency Use Authorization (EUA). This EUA will remain in effect (meaning this test can be used) for the duration of the COVID-19 declaration under Section 564(b)(1) of the Act, 21 U.S.C. section 360bbb-3(b)(1), unless the authorization is terminated or revoked.     Resp Syncytial Virus by PCR NEGATIVE NEGATIVE Final    Comment: (NOTE) Fact Sheet for Patients: BloggerCourse.com  Fact Sheet for Healthcare Providers: SeriousBroker.it  This test is not yet approved or cleared by the [place]  FDA and has been authorized for detection and/or diagnosis of SARS-CoV-2 by FDA under an Emergency Use Authorization (EUA). This EUA will remain in effect (meaning this  test can be used) for the duration of the COVID-19 declaration under Section 564(b)(1) of the Act, 21 U.S.C. section 360bbb-3(b)(1), unless the authorization is terminated or revoked.  Performed at [facility], [address] [place], [place] [address]   Culture, blood (Routine X 2) w Reflex to ID Panel     Status: None (Preliminary result)   Collection Time: 10/11/23  8:59 PM   Specimen: BLOOD  Result Value Ref Range Status   Specimen Description   Final    BLOOD BLOOD RIGHT HAND Performed at [facility], [address], [place], [place] [address]    Special Requests   Final    Blood Culture results may not be optimal due to an inadequate volume of blood received in culture bottles BOTTLES DRAWN AEROBIC ONLY Performed at  [facility], [address] [place], [place] [address]    Culture   Final    NO GROWTH 4 DAYS Performed at [facility], [address] [place], [place] [address]    Report Status PENDING  Incomplete  Culture, blood (Routine X 2) w Reflex to ID Panel     Status: None (Preliminary result)   Collection Time: 10/11/23  9:28 PM   Specimen: BLOOD  Result Value Ref Range Status   Specimen Description   Final    BLOOD BLOOD LEFT ARM Performed at [facility], [address], [place], [place] [address]    Special Requests   Final    Blood Culture results may not be optimal due to an inadequate volume of blood received in culture bottles BOTTLES DRAWN AEROBIC ONLY Performed at [facility], [address], [place], [place] [address]    Culture   Final    NO GROWTH 4 DAYS Performed at [facility], [address] [place], [place] [address]    Report Status PENDING  Incomplete     Time coordinating discharge: 40 minutes  SIGNED:   Renato Applebaum, MD  [facility] 10/16/2023, 9:26 AM

## 2023-10-16 NOTE — Care Management Important Message (Signed)
 Important Message  Patient Details IM Letter given Name: Erika Conley MRN: [id number] Date of Birth: 12/24/1934   Important Message Given:  Yes - Medicare IM     Christoffer Currier 10/16/2023, 9:54 AM

## 2023-10-16 NOTE — TOC Transition Note (Signed)
 Transition of Care [facility]) - Discharge Note   Patient Details  Name: Erika Conley MRN: [id number] Date of Birth: 1934/08/21  Transition of Care [facility]) CM/SW Contact:  Bascom Service, RN Phone Number: 10/16/2023, 10:40 AM   Clinical Narrative: d/c back to [facility] rep [place] accepted-going to rm [id number], report #[id number].[facility] called. No further CM needs.     Final next level of care: Long Term Nursing Home Barriers to Discharge: No Barriers Identified   Patient Goals and CMS Choice Patient states their goals for this hospitalization and ongoing recovery are:: Return to [email or web address] Compare Post Acute Care list provided to:: Patient Choice offered to / list presented to : Patient [place] ownership interest in [facility].provided to:: Patient    Discharge Placement              Patient chooses bed at: [facility] Patient to be transferred to facility by: [facility] Name of family member notified: Patient Patient and family notified of of transfer: 10/16/23  Discharge Plan and Services Additional resources added to the After Visit Summary for     Discharge Planning Services: CM Consult Post Acute Care Choice: Nursing Home                               Social Drivers of Health (SDOH) Interventions SDOH Screenings   Food Insecurity: No Food Insecurity (10/09/2023)  Housing: Low Risk  (10/09/2023)  Transportation Needs: No Transportation Needs (10/09/2023)  Utilities: Not At Risk (10/09/2023)  Social Connections: Socially Isolated (10/09/2023)  Tobacco Use: Low Risk  (10/09/2023)     Readmission Risk Interventions    02/01/2023   12:43 PM  Readmission Risk Prevention Plan  Transportation Screening Complete  PCP or Specialist Appt within 5-7 Days Complete  Home Care Screening Complete  Medication Review (RN CM) Complete

## 2023-10-17 LAB — CULTURE, BLOOD (ROUTINE X 2)
Culture: NO GROWTH
Culture: NO GROWTH

## 2023-11-30 ENCOUNTER — Ambulatory Visit: Payer: Medicare (Managed Care) | Admitting: Gastroenterology

## 2023-11-30 NOTE — Progress Notes (Deleted)
 Erika Conley [id number] August 20, 1934   Chief Complaint:  Referring Provider: No ref. provider found Primary GI MD: Dr. Wilhelmenia  HPI: Erika Conley is a 89 y.o. female with past medical history of GERD, hypothyroidism, chronic orthostatic hypotension who presents today for hospital follow-up.    Admitted 10/08/2023 to 10/16/2023 for upper GI bleeding and acute blood loss anemia.  Presented with hematemesis, started on IV Protonix , antibiotics, fluids, and admitted with GI consultation.  She received 1 unit PRBC transfusion.  Hemoglobin remained stable after that.  Underwent EGD 8/19 with Dieulafoy lesion of stomach noted in the cardia. Epinephrine / fulguration via BiCap/ clipping performed. Tattoo placed adjacent. Evidence of hematin found in the entire stomach. Advised to take PPI twice daily as well as Carafate  and follow-up outpatient. Biopsies showed gastritis, and focal intestinal metaplasia in the stomach.  Dr. Wilhelmenia recommended repeat EGD in 3 months with gastric mapping.   Previous GI Procedures/Imaging   EGD 10/08/2023 - No gross lesions in the entire esophagus. Schatzki ring. Z- line irregular, 32 cm from the incisors.  - 3 cm hiatal hernia.  - Hematin ( altered blood/ coffee- ground- like material) in the entire stomach. Lavage with adequate visualization.  - Dieulafoy lesion of stomach noted in the cardia. Epinephrine / fulguration via BiCap/ clipping performed. Tattoo placed adjacent.  - Granular and texture changed mucosa in the prepyloric region of the stomach. Biopsied.  - Erythematous mucosa in the stomach. Biopsied.  - No gross lesions in the duodenal bulb, in the first portion of the duodenum and in the second portion of the duodenum. Biopsied.  FINAL MICROSCOPIC DIAGNOSIS:   A. DUODENAL BIOPSY:  - Unremarkable duodenal mucosa.  - Negative for features of celiac, dysplasia, and malignancy.   B. STOMACH, RANDOM, BIOPSY:  - Reactive and healing erosive  gastritis with focal mild chronic  gastritis.  - IHC for H. pylori will be reported in an addendum.  - Negative for intestinal metaplasia, dysplasia, and malignancy.   C. STOMACH, PREPYLORIC, BIOPSY:  - Antral mucosa with reactive and mild chronic gastritis.  - Focal intestinal metaplasia is present.  - IHC for H. pylori will be reported in an addendum.  - Negative for dysplasia and malignancy.   ADDENDUM:  IHC stains for H. pylori were performed on specimens B and C and are  negative. Stain controls worked appropriately.     Previous upper endoscopy performed at the time of an ERCP in October 2017 with grossly normal appearance of the upper GI tract at that time.   Last colonoscopy 2003.  Past Medical History:  Diagnosis Date   Anxiety    Arthritis    Depression    Falls frequently    Fibromyalgia    Fracture, clavicle    GERD (gastroesophageal reflux disease)    History of fractured vertebra    Hypertension    Hypothyroidism    Seasonal allergies    Thyroid  disease    Vertigo    Vitamin A deficiency    Wrist fracture, right     Past Surgical History:  Procedure Laterality Date   ABDOMINAL HYSTERECTOMY     BACK SURGERY     CHOLECYSTECTOMY     ERCP N/A 10/22/2015   Procedure: ENDOSCOPIC RETROGRADE CHOLANGIOPANCREATOGRAPHY (ERCP);  Surgeon: Toribio SHAUNNA Cedar, MD;  Location: [facility];  Service: Endoscopy;  Laterality: N/A;   ERCP N/A 11/25/2015   Procedure: ENDOSCOPIC RETROGRADE CHOLANGIOPANCREATOGRAPHY (ERCP);  Surgeon: Toribio SHAUNNA Cedar, MD;  Location: [facility];  Service:  Endoscopy;  Laterality: N/A;   ESOPHAGOGASTRODUODENOSCOPY N/A 10/09/2023   Procedure: EGD (ESOPHAGOGASTRODUODENOSCOPY);  Surgeon: Wilhelmenia Aloha Raddle., MD;  Location: [facility];  Service: Gastroenterology;  Laterality: N/A;   EUS N/A 11/25/2015   Procedure: ESOPHAGEAL ENDOSCOPIC ULTRASOUND (EUS) RADIAL;  Surgeon: Toribio SHAUNNA Cedar, MD;  Location: [facility];  Service: Endoscopy;  Laterality:  N/A;   FRACTURE SURGERY     HEMOSTASIS CLIP PLACEMENT  10/09/2023   Procedure: CONTROL OF HEMORRHAGE, GI TRACT, ENDOSCOPIC, BY CLIPPING OR OVERSEWING;  Surgeon: Wilhelmenia Aloha Raddle., MD;  Location: [facility];  Service: Gastroenterology;;   ORIF ANKLE FRACTURE Right 01/30/2023   Procedure: OPEN REDUCTION INTERNAL FIXATION (ORIF) ANKLE FRACTURE;  Surgeon: Germaine Redbird, MD;  Location: [facility];  Service: Orthopedics;  Laterality: Right;  regular table, C-arm, arthrex   SUBMUCOSAL INJECTION  10/09/2023   Procedure: INJECTION, SUBMUCOSAL;  Surgeon: Wilhelmenia Aloha Raddle., MD;  Location: [facility];  Service: Gastroenterology;;   THYROIDECTOMY     WRIST SURGERY      Current Outpatient Medications  Medication Sig Dispense Refill   acetaminophen  (TYLENOL ) 325 MG tablet Take 1 tablet (325 mg total) by mouth every 4 (four) hours as needed for headache. (Patient taking differently: Take 325-650 mg by mouth as directed. Take 650 mg BID & Take 325 mg every 8 hours PRN for headache) 30 tablet 0   busPIRone  (BUSPAR ) 10 MG tablet Take 20 mg by mouth 2 (two) times daily.     calcium  carbonate (TUMS - DOSED IN MG ELEMENTAL CALCIUM ) 500 MG chewable tablet Chew 2-3 tablets by mouth as directed. Give 1 tablet by mouth before meals & Give 2 tablets by mouth PRN for TID with meals     Carboxymethylcellul-Glycerin (REFRESH OPTIVE OP) Place 1 drop into both eyes See admin instructions. Instill 1 drop into one or both eyes up to 6 times a day as needed for dryness     cetirizine (ZYRTEC) 10 MG tablet Take 10 mg by mouth daily as needed for allergies.     cyanocobalamin  1000 MCG tablet Take 1 tablet (1,000 mcg total) by mouth daily.     diclofenac  Sodium (VOLTAREN ) 1 % GEL Apply 2 g topically 2 (two) times daily as needed (for back pain).     dicyclomine  (BENTYL ) 20 MG tablet Take 1 tablet (20 mg total) by mouth 3 (three) times daily before meals. 90 tablet 0   donepezil  (ARICEPT ) 10 MG tablet Take 10 mg by mouth  at bedtime.     famotidine  (PEPCID ) 20 MG tablet Take 20 mg by mouth 2 (two) times daily.     ferrous sulfate  325 (65 FE) MG tablet Take 1 tablet (325 mg total) by mouth daily before breakfast.     fluticasone  (FLONASE ) 50 MCG/ACT nasal spray Place 1 spray into both nostrils in the morning and at bedtime.     HYDROcodone -acetaminophen  (NORCO/VICODIN) 5-325 MG tablet Take 1 tablet by mouth every 6 (six) hours as needed for moderate pain (pain score 4-6) or severe pain (pain score 7-10). 20 tablet 0   hydrOXYzine  (ATARAX ) 10 MG tablet Take 1 tablet (10 mg total) by mouth every 6 (six) hours as needed for itching. (Patient not taking: Reported on 10/09/2023) 30 tablet 0   levothyroxine  (SYNTHROID ) 100 MCG tablet Take 100 mcg by mouth daily before breakfast.     loperamide  (IMODIUM  A-D) 2 MG tablet Take 2-4 mg by mouth See admin instructions. Take 4 mg by mouth after the 1st loose stool movement and an  additional 2 mg after each loose stool (CANNOT EXCEED 16 MG/24 HOURS)     LORazepam  (ATIVAN ) 0.5 MG tablet Take 0.5-1 tablets (0.25-0.5 mg total) by mouth 2 (two) times daily. Take 0.25 mg by mouth in the morning & Take 0.5 mg by mouth in the evening 20 tablet 0   Menthol , Topical Analgesic, (BIOFREEZE COOL THE PAIN) 4 % GEL Apply 1 application  topically daily.     Multiple Vitamins-Minerals (HEALTHY EYES SUPERVISION 2 PO) Take 1 capsule by mouth 2 (two) times daily.     omeprazole  (PRILOSEC) 40 MG capsule Take 1 capsule (40 mg total) by mouth 2 (two) times daily. Take shortly before breakfast and dinner meal     polyethylene glycol (MIRALAX  / GLYCOLAX ) 17 g packet Take 17 g by mouth daily. 14 each 0   promethazine  (PHENERGAN ) 25 MG tablet Take 25 mg by mouth every 4 (four) hours as needed for nausea or vomiting.     senna-docusate (SENOKOT-S) 8.6-50 MG tablet Take 1 tablet by mouth at bedtime as needed for mild constipation. 60 tablet 0   sertraline  (ZOLOFT ) 100 MG tablet Take 200 mg by mouth at bedtime.      sucralfate  (CARAFATE ) 1 GM/10ML suspension Take 10 mLs (1 g total) by mouth 2 (two) times daily. 420 mL 0   traZODone  (DESYREL ) 150 MG tablet Take 150 mg by mouth at bedtime.     No current facility-administered medications for this visit.    Allergies as of 11/30/2023 - Review Complete 10/09/2023  Allergen Reaction Noted   Beta sitosterol [sitosterols] Other (See Comments) 01/30/2023   Blueberry [vaccinium angustifolium] Other (See Comments) 01/30/2023   Chocolate Itching and Other (See Comments) 04/12/2022   Codeine Other (See Comments) 07/20/2010   Cortisone Other (See Comments) 07/20/2010   Guggulipid-black pepper Itching 04/12/2022   Morphine  Other (See Comments) 10/13/2021   Nsaids Other (See Comments) 10/13/2021   Other Other (See Comments) 02/16/2013    Family History  Problem Relation Age of Onset   Hypertension Other     Social History   Tobacco Use   Smoking status: Never   Smokeless tobacco: Never  Vaping Use   Vaping status: Never Used  Substance Use Topics   Alcohol  use: No   Drug use: No     Review of Systems:    Constitutional: No weight loss, fever, chills, weakness or fatigue Eyes: No change in vision Ears, Nose, Throat:  No change in hearing or congestion Skin: No rash or itching Cardiovascular: No chest pain, chest pressure or palpitations   Respiratory: No SOB or cough Gastrointestinal: See HPI and otherwise negative Genitourinary: No dysuria or change in urinary frequency Neurological: No headache, dizziness or syncope Musculoskeletal: No new muscle or joint pain Hematologic: No bleeding or bruising    Physical Exam:  Vital signs: There were no vitals taken for this visit.  Constitutional: NAD, Well developed, Well nourished, alert and cooperative Head:  Normocephalic and atraumatic.  Eyes: No scleral icterus. Conjunctiva pink. Mouth: No oral lesions. Respiratory: Respirations even and unlabored. Lungs clear to auscultation  bilaterally.  No wheezes, crackles, or rhonchi.  Cardiovascular:  Regular rate and rhythm. No murmurs. No peripheral edema. Gastrointestinal:  Soft, nondistended, nontender. No rebound or guarding. Normal bowel sounds. No appreciable masses or hepatomegaly. Rectal:  Not performed.  Neurologic:  Alert and oriented x4;  grossly normal neurologically.  Skin:   Dry and intact without significant lesions or rashes. Psychiatric: Oriented to person, place and time. Demonstrates  good judgement and reason without abnormal affect or behaviors.   RELEVANT LABS AND IMAGING: CBC    Component Value Date/Time   WBC 3.4 (L) 10/15/2023 0556   RBC 3.12 (L) 10/15/2023 0556   HGB 9.2 (L) 10/15/2023 0556   HCT 28.1 (L) 10/15/2023 0556   PLT 159 10/15/2023 0556   MCV 90.1 10/15/2023 0556   MCH 29.5 10/15/2023 0556   MCHC 32.7 10/15/2023 0556   RDW 14.0 10/15/2023 0556   LYMPHSABS 1.1 10/14/2023 0524   MONOABS 0.4 10/14/2023 0524   EOSABS 0.1 10/14/2023 0524   BASOSABS 0.0 10/14/2023 0524    CMP     Component Value Date/Time   NA 138 10/14/2023 0524   K 3.7 10/14/2023 0524   CL 102 10/14/2023 0524   CO2 26 10/14/2023 0524   GLUCOSE 90 10/14/2023 0524   BUN 12 10/14/2023 0524   CREATININE 0.70 10/14/2023 0524   CALCIUM  7.9 (L) 10/14/2023 0524   PROT 4.6 (L) 10/14/2023 0524   ALBUMIN 2.5 (L) 10/14/2023 0524   AST 12 (L) 10/14/2023 0524   ALT 5 10/14/2023 0524   ALKPHOS 44 10/14/2023 0524   BILITOT 0.4 10/14/2023 0524   GFRNONAA >60 10/14/2023 0524   GFRAA >60 01/04/2019 0927     Assessment/Plan:    Repeat EGD Update labs    Camie Furbish, PA-C [facility] 11/30/2023, 8:07 AM  Patient Care Team: Pcp, No as PCP - Diedre Francyne Headland, MD as PCP - Cardiology (Cardiology)
